# Patient Record
Sex: Female | Born: 1935 | Race: White | Hispanic: No | Smoking: Never smoker
Health system: Southern US, Community
[De-identification: ages and names within clinical notes are randomized; demographics above are authoritative.]

## PROBLEM LIST (undated history)

## (undated) DIAGNOSIS — K224 Dyskinesia of esophagus: Secondary | ICD-10-CM

## (undated) DIAGNOSIS — N189 Chronic kidney disease, unspecified: Secondary | ICD-10-CM

## (undated) DIAGNOSIS — R609 Edema, unspecified: Secondary | ICD-10-CM

## (undated) DIAGNOSIS — R001 Bradycardia, unspecified: Secondary | ICD-10-CM

## (undated) DIAGNOSIS — N183 Chronic kidney disease, stage 3 unspecified: Secondary | ICD-10-CM

## (undated) DIAGNOSIS — D631 Anemia in chronic kidney disease: Secondary | ICD-10-CM

## (undated) DIAGNOSIS — I5041 Acute combined systolic (congestive) and diastolic (congestive) heart failure: Secondary | ICD-10-CM

## (undated) DIAGNOSIS — D649 Anemia, unspecified: Secondary | ICD-10-CM

## (undated) DIAGNOSIS — N179 Acute kidney failure, unspecified: Secondary | ICD-10-CM

## (undated) DIAGNOSIS — E785 Hyperlipidemia, unspecified: Secondary | ICD-10-CM

## (undated) DIAGNOSIS — I10 Essential (primary) hypertension: Secondary | ICD-10-CM

## (undated) DIAGNOSIS — I480 Paroxysmal atrial fibrillation: Secondary | ICD-10-CM

## (undated) DIAGNOSIS — E119 Type 2 diabetes mellitus without complications: Secondary | ICD-10-CM

## (undated) DIAGNOSIS — R339 Retention of urine, unspecified: Secondary | ICD-10-CM

## (undated) DIAGNOSIS — N329 Bladder disorder, unspecified: Secondary | ICD-10-CM

## (undated) DIAGNOSIS — N390 Urinary tract infection, site not specified: Secondary | ICD-10-CM

## (undated) DIAGNOSIS — I517 Cardiomegaly: Secondary | ICD-10-CM

## (undated) DIAGNOSIS — D638 Anemia in other chronic diseases classified elsewhere: Secondary | ICD-10-CM

## (undated) DIAGNOSIS — D62 Acute posthemorrhagic anemia: Secondary | ICD-10-CM

## (undated) DIAGNOSIS — R6251 Failure to thrive (child): Secondary | ICD-10-CM

## (undated) DIAGNOSIS — I2721 Secondary pulmonary arterial hypertension: Secondary | ICD-10-CM

## (undated) DIAGNOSIS — I4892 Unspecified atrial flutter: Secondary | ICD-10-CM

## (undated) HISTORY — PX: TONSILLECTOMY: SUR1361

## (undated) HISTORY — DX: Chronic kidney disease, unspecified: N17.9

## (undated) HISTORY — DX: Acute posthemorrhagic anemia: D62

## (undated) HISTORY — DX: Chronic kidney disease, unspecified: N18.9

## (undated) HISTORY — DX: Secondary pulmonary arterial hypertension: I27.21

## (undated) HISTORY — DX: Dyskinesia of esophagus: K22.4

## (undated) HISTORY — PX: PARTIAL HYSTERECTOMY: SHX80

## (undated) HISTORY — DX: Cardiomegaly: I51.7

## (undated) HISTORY — DX: Urinary tract infection, site not specified: N39.0

## (undated) HISTORY — PX: TOE SURGERY: SHX1073

## (undated) HISTORY — DX: Anemia in other chronic diseases classified elsewhere: D63.8

## (undated) HISTORY — DX: Anemia in chronic kidney disease: D63.1

## (undated) HISTORY — DX: Acute combined systolic (congestive) and diastolic (congestive) heart failure: I50.41

## (undated) HISTORY — DX: Retention of urine, unspecified: R33.9

## (undated) HISTORY — DX: Failure to thrive (child): R62.51

## (undated) HISTORY — DX: Edema, unspecified: R60.9

## (undated) HISTORY — DX: Bladder disorder, unspecified: N32.9

## (undated) MED FILL — Ferumoxytol Inj 510 MG/17ML (30 MG/ML) (Elemental Fe): INTRAVENOUS | Qty: 17 | Status: AC

## (undated) MED FILL — Luspatercept-aamt For Subcutaneous Inj 25 MG: SUBCUTANEOUS | Qty: 1.7 | Status: AC

---

## 2013-03-23 ENCOUNTER — Ambulatory Visit (INDEPENDENT_AMBULATORY_CARE_PROVIDER_SITE_OTHER): Payer: Medicare Other

## 2013-03-23 ENCOUNTER — Encounter (INDEPENDENT_AMBULATORY_CARE_PROVIDER_SITE_OTHER): Payer: Self-pay

## 2013-03-23 VITALS — BP 127/61 | HR 78 | Temp 97.6°F | Resp 16

## 2013-03-23 DIAGNOSIS — L97509 Non-pressure chronic ulcer of other part of unspecified foot with unspecified severity: Secondary | ICD-10-CM

## 2013-03-23 DIAGNOSIS — B351 Tinea unguium: Secondary | ICD-10-CM

## 2013-03-23 DIAGNOSIS — M2040 Other hammer toe(s) (acquired), unspecified foot: Secondary | ICD-10-CM

## 2013-03-23 DIAGNOSIS — E1140 Type 2 diabetes mellitus with diabetic neuropathy, unspecified: Secondary | ICD-10-CM

## 2013-03-23 DIAGNOSIS — E1142 Type 2 diabetes mellitus with diabetic polyneuropathy: Secondary | ICD-10-CM

## 2013-03-23 DIAGNOSIS — L02619 Cutaneous abscess of unspecified foot: Secondary | ICD-10-CM

## 2013-03-23 DIAGNOSIS — M79609 Pain in unspecified limb: Secondary | ICD-10-CM

## 2013-03-23 DIAGNOSIS — E1149 Type 2 diabetes mellitus with other diabetic neurological complication: Secondary | ICD-10-CM

## 2013-03-23 MED ORDER — AMOXICILLIN-POT CLAVULANATE 875-125 MG PO TABS: 1.0000 | ORAL_TABLET | Freq: Two times a day (BID) | ORAL | Status: DC

## 2013-03-23 MED ORDER — CIPROFLOXACIN HCL 500 MG PO TABS: 500.0000 mg | ORAL_TABLET | Freq: Two times a day (BID) | ORAL | Status: DC

## 2013-03-23 NOTE — Progress Notes (Signed)
  Subjective:    Patient ID: Maureen Stout, female    DOB: 05/20/1931, 79 y.o.   MRN: [id number] Trim nails and trim on my 4th toe, has a place on it HPI within the last week to 2 weeks the fourth toe.to be red and swollen there was some increased bleeding. Patient is also been having possibly a bladder infection. The toe is definitely red and swollen with some discharge drainage distal ulceration fourth left    Review of Systems  Constitutional: Negative.   HENT: Negative.   Eyes: Negative.   Respiratory: Negative.   Cardiovascular: Negative.   Gastrointestinal: Negative.   Endocrine: Negative.   Genitourinary: Negative.   Musculoskeletal: Negative.   Skin: Negative.   Allergic/Immunologic: Negative.   Neurological: Negative.   Hematological: Bruises/bleeds easily.  Psychiatric/Behavioral: Negative.        Objective:   Physical Exam Vascular status as follows DP +0/4 PT plus one over 4. Refill timed 3-4 seconds all digits. Skin temperature warm turgor diminished no varicosities noted neurologically epicritic and proprioceptive sensations grossly diminished on Semmes Weinstein testing. Normal plantar response. Patient had distal digital dictation third left has open active ulceration fourth left with hemorrhage a keratoses and maceration. Severe digital contractures 2 through 5 bilateral again amputated third left noted. Slight edema and erythema localized to distal tuft of the fourth digit. No ascending cellulitis noted no lymphangitis. The remaining nails have thickness brittle discolored and friable nails tender on palpation with enclosed shoe wear and ambulation. Patient wearing her diabetic shoes as instructed however continues to have difficulties. Has reduced her activity levels as instructed as well.    Assessment & Plan:  Diabetes with peripheral neuropathy. Ulceration with cellulitis and infection distal tuft fourth left the ulcer site is debrided treatment will maintain  Silvadene and gauze dressing being applied. The ulcer is approximately 0.6 cm in diameter with a 2 mm depth. No malodor is noted. On debridement Silvadene gauze dressing is applied. The remaining nails x8 are debrided and the presence of onychomycosis pain or symptomology. As well as her diabetes. Patient will be recheck in 2 weeks for followup at this time prescription for Augmentin 875 twice a day and Cipro 500 twice a day are both prescribed patient will maintain the a bike regimen as instructed maintain accommodative shoes recheck in 2 weeks for followup  Harriet Masson DPM

## 2013-03-23 NOTE — Patient Instructions (Signed)
Instructions for Wound Care  The most important step to healing a foot wound is to reduce the pressure on your foot - it is extremely important to stay off your foot as much as possible and wear the shoe/boot as instructed.  Cleanse your foot with saline wash or warm soapy water (dial antibacterial soap or similar).  Blot dry.  Apply prescribed medication to your wound and cover with gauze and a bandage.  May hold bandage in place with Coban (self sticky wrap), Ace bandage or tape.  You may find dressing supplies at your local [employer], [employer], drug store or medical supply store.  Your prescribed topical medication is :  Silvadene Cream (twice daily)  [facility] [employer] is a mail order medical supply company that we use to provide some of our would care products.  If we use their service of you, you will receive the product by mail.  If you have not received the medication in 3 business days, please call our office.  If you notice any foul odor, increase in pain, pus, increased swelling, red streaks or generalized redness occurring in your foot or leg-Call our office immediately to be seen.  This may be a sign of a limb or life threatening infection that will need prompt attention.  Harriet Masson, [place]  [facility]

## 2013-04-20 ENCOUNTER — Ambulatory Visit: Payer: Medicare Other

## 2013-04-26 ENCOUNTER — Ambulatory Visit (INDEPENDENT_AMBULATORY_CARE_PROVIDER_SITE_OTHER): Payer: Medicare Other

## 2013-04-26 VITALS — BP 156/75 | HR 87 | Resp 16

## 2013-04-26 DIAGNOSIS — L97509 Non-pressure chronic ulcer of other part of unspecified foot with unspecified severity: Secondary | ICD-10-CM

## 2013-04-26 DIAGNOSIS — M2040 Other hammer toe(s) (acquired), unspecified foot: Secondary | ICD-10-CM

## 2013-04-26 DIAGNOSIS — E1149 Type 2 diabetes mellitus with other diabetic neurological complication: Secondary | ICD-10-CM

## 2013-04-26 DIAGNOSIS — E1140 Type 2 diabetes mellitus with diabetic neuropathy, unspecified: Secondary | ICD-10-CM

## 2013-04-26 DIAGNOSIS — E1142 Type 2 diabetes mellitus with diabetic polyneuropathy: Secondary | ICD-10-CM

## 2013-04-26 NOTE — Patient Instructions (Signed)
Diabetes and Foot Care Diabetes may cause you to have problems because of poor blood supply (circulation) to your feet and legs. This may cause the skin on your feet to become thinner, break easier, and heal more slowly. Your skin may become dry, and the skin may peel and crack. You may also have nerve damage in your legs and feet causing decreased feeling in them. You may not notice minor injuries to your feet that could lead to infections or more serious problems. Taking care of your feet is one of the most important things you can do for yourself.  HOME CARE INSTRUCTIONS  Wear shoes at all times, even in the house. Do not go barefoot. Bare feet are easily injured.  Check your feet daily for blisters, cuts, and redness. If you cannot see the bottom of your feet, use a mirror or ask someone for help.  Wash your feet with warm water (do not use hot water) and mild soap. Then pat your feet and the areas between your toes until they are completely dry. Do not soak your feet as this can dry your skin.  Apply a moisturizing lotion or petroleum jelly (that does not contain alcohol and is unscented) to the skin on your feet and to dry, brittle toenails. Do not apply lotion between your toes.  Trim your toenails straight across. Do not dig under them or around the cuticle. File the edges of your nails with an emery board or nail file.  Do not cut corns or calluses or try to remove them with medicine.  Wear clean socks or stockings every day. Make sure they are not too tight. Do not wear knee-high stockings since they may decrease blood flow to your legs.  Wear shoes that fit properly and have enough cushioning. To break in new shoes, wear them for just a few hours a day. This prevents you from injuring your feet. Always look in your shoes before you put them on to be sure there are no objects inside.  Do not cross your legs. This may decrease the blood flow to your feet.  If you find a minor scrape,  cut, or break in the skin on your feet, keep it and the skin around it clean and dry. These areas may be cleansed with mild soap and water. Do not cleanse the area with peroxide, alcohol, or iodine.  When you remove an adhesive bandage, be sure not to damage the skin around it.  If you have a wound, look at it several times a day to make sure it is healing.  Do not use heating pads or hot water bottles. They may burn your skin. If you have lost feeling in your feet or legs, you may not know it is happening until it is too late.  Make sure your health care provider performs a complete foot exam at least annually or more often if you have foot problems. Report any cuts, sores, or bruises to your health care provider immediately. SEEK MEDICAL CARE IF:   You have an injury that is not healing.  You have cuts or breaks in the skin.  You have an ingrown nail.  You notice redness on your legs or feet.  You feel burning or tingling in your legs or feet.  You have pain or cramps in your legs and feet.  Your legs or feet are numb.  Your feet always feel cold. SEEK IMMEDIATE MEDICAL CARE IF:   There is increasing redness,   swelling, or pain in or around a wound.  There is a red line that goes up your leg.  Pus is coming from a wound.  You develop a fever or as directed by your health care provider.  You notice a bad smell coming from an ulcer or wound. Document Released: 05/01/2000 Document Revised: 01/04/2013 Document Reviewed: 10/11/2012 [employer] Patient Information 2014 [employer].  

## 2013-04-26 NOTE — Progress Notes (Signed)
   Subjective:    Patient ID: Maureen Stout, female    DOB: 20-Jul-1931, 79 y.o.   MRN: [id number]  HPI my 4th toe on my left foot looks good    Review of Systems no new changes or findings     Objective:   Physical Exam Neurovascular status intact and unchanged pedal pulses DP nonpalpable PT plus one over 4 bilateral Refill time 3 seconds all digits. Patient is ulceration to distal tuft distal lateral fourth digit left foot showing some hemorrhage a keratoses there is no active discharge or drainage patient been doing soaking and completed her in a bike regimen as instructed. Patient had amputated distal tuft of third digit and the fourth is under lapping of third toe stump. At this time with hemorrhage a keratosis and nail of the second and fourth digits are debrided back. A keratotic lesion distal tuft of the fourth is debrided down to dermal level only does not go down to subcutaneous tissue the ulcer is healing in resolving. No active discharge or drainage no cellulitis noted. Patient does have continued arthropathy and deformity as well as diabetic neuropathy. As such she attended continues to have the risks for infections and recurrence. We'll monitoring contact us if any changes occur.       Assessment & Plan:  Assessment diabetes with peripheral neuropathy and ulceration distal fourth left. The ulcer is resolving remaining hemorrhage a keratoses debridement at this time down to dermal level maintain to foam padding multiple pads and digital spacers are dispensed at this time for patient to use maintain appropriate diabetic accommodative shoes recheck in 3 months for long-term palliative care visit. Next  Harriet Masson DPM

## 2013-07-27 ENCOUNTER — Ambulatory Visit (INDEPENDENT_AMBULATORY_CARE_PROVIDER_SITE_OTHER): Payer: Commercial Managed Care - HMO

## 2013-07-27 VITALS — BP 161/84 | HR 67 | Resp 16

## 2013-07-27 DIAGNOSIS — E1140 Type 2 diabetes mellitus with diabetic neuropathy, unspecified: Secondary | ICD-10-CM

## 2013-07-27 DIAGNOSIS — E1142 Type 2 diabetes mellitus with diabetic polyneuropathy: Secondary | ICD-10-CM

## 2013-07-27 DIAGNOSIS — M79609 Pain in unspecified limb: Secondary | ICD-10-CM

## 2013-07-27 DIAGNOSIS — B351 Tinea unguium: Secondary | ICD-10-CM

## 2013-07-27 DIAGNOSIS — Q828 Other specified congenital malformations of skin: Secondary | ICD-10-CM

## 2013-07-27 DIAGNOSIS — E1149 Type 2 diabetes mellitus with other diabetic neurological complication: Secondary | ICD-10-CM

## 2013-07-27 NOTE — Progress Notes (Signed)
° °  Subjective:    Patient ID: Maureen Stout, female    DOB: [birth year], 80 y.o.   MRN: [id number]  HPI I am here to get my nails and calluses trimmed up    Review of Systems no new changes or findings patient is status post amputation of third toe left with history of ulceration second toe left.    Objective:   Physical Exam Or fainting to findings as follows DP pulse nonpalpable PT one over 4 bilateral Refill time 3 seconds all digits skin temperature is warm to cool turgor diminished there is keratoses distal clavus second right progression second left no open wounds of ulceration also pinch callus of both bilateral at the IP joint. 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 No active ulcers no discharge or drainage noted this time nails and ranges thick and darkened yellow brittle crumbly and friable consistent with onychomycosis and dystrophy of nails patient does have arthropathy as well as diabetic neuropathy decreased epicritic sensation confirmed on Semmes Weinstein testing to forefoot digits       Assessment & Plan:  Assessment this time diabetes with complications history peripheral neuropathy history of ulceration partial toe amputation. This time thick dystrophic probably mycotic nails debrided x9 return in 3 months for continued palliative care and as-needed basis maintain accommodative shoes as instructed  Harriet Masson DPM

## 2013-07-27 NOTE — Patient Instructions (Signed)
Diabetes and Foot Care Diabetes may cause you to have problems because of poor blood supply (circulation) to your feet and legs. This may cause the skin on your feet to become thinner, break easier, and heal more slowly. Your skin may become dry, and the skin may peel and crack. You may also have nerve damage in your legs and feet causing decreased feeling in them. You may not notice minor injuries to your feet that could lead to infections or more serious problems. Taking care of your feet is one of the most important things you can do for yourself.  HOME CARE INSTRUCTIONS  Wear shoes at all times, even in the house. Do not go barefoot. Bare feet are easily injured.  Check your feet daily for blisters, cuts, and redness. If you cannot see the bottom of your feet, use a mirror or ask someone for help.  Wash your feet with warm water (do not use hot water) and mild soap. Then pat your feet and the areas between your toes until they are completely dry. Do not soak your feet as this can dry your skin.  Apply a moisturizing lotion or petroleum jelly (that does not contain alcohol and is unscented) to the skin on your feet and to dry, brittle toenails. Do not apply lotion between your toes.  Trim your toenails straight across. Do not dig under them or around the cuticle. File the edges of your nails with an emery board or nail file.  Do not cut corns or calluses or try to remove them with medicine.  Wear clean socks or stockings every day. Make sure they are not too tight. Do not wear knee-high stockings since they may decrease blood flow to your legs.  Wear shoes that fit properly and have enough cushioning. To break in new shoes, wear them for just a few hours a day. This prevents you from injuring your feet. Always look in your shoes before you put them on to be sure there are no objects inside.  Do not cross your legs. This may decrease the blood flow to your feet.  If you find a minor scrape,  cut, or break in the skin on your feet, keep it and the skin around it clean and dry. These areas may be cleansed with mild soap and water. Do not cleanse the area with peroxide, alcohol, or iodine.  When you remove an adhesive bandage, be sure not to damage the skin around it.  If you have a wound, look at it several times a day to make sure it is healing.  Do not use heating pads or hot water bottles. They may burn your skin. If you have lost feeling in your feet or legs, you may not know it is happening until it is too late.  Make sure your health care provider performs a complete foot exam at least annually or more often if you have foot problems. Report any cuts, sores, or bruises to your health care provider immediately. SEEK MEDICAL CARE IF:   You have an injury that is not healing.  You have cuts or breaks in the skin.  You have an ingrown nail.  You notice redness on your legs or feet.  You feel burning or tingling in your legs or feet.  You have pain or cramps in your legs and feet.  Your legs or feet are numb.  Your feet always feel cold. SEEK IMMEDIATE MEDICAL CARE IF:   There is increasing redness,   swelling, or pain in or around a wound.  There is a red line that goes up your leg.  Pus is coming from a wound.  You develop a fever or as directed by your health care provider.  You notice a bad smell coming from an ulcer or wound. Document Released: 05/01/2000 Document Revised: 01/04/2013 Document Reviewed: 10/11/2012 [employer] Patient Information 2014 [employer].  

## 2013-11-09 ENCOUNTER — Ambulatory Visit: Payer: Commercial Managed Care - HMO

## 2013-11-16 ENCOUNTER — Ambulatory Visit: Payer: Commercial Managed Care - HMO

## 2013-11-27 ENCOUNTER — Ambulatory Visit (INDEPENDENT_AMBULATORY_CARE_PROVIDER_SITE_OTHER): Payer: Commercial Managed Care - HMO

## 2013-11-27 VITALS — BP 134/58 | HR 71 | Resp 18

## 2013-11-27 DIAGNOSIS — E1140 Type 2 diabetes mellitus with diabetic neuropathy, unspecified: Secondary | ICD-10-CM

## 2013-11-27 DIAGNOSIS — M79606 Pain in leg, unspecified: Secondary | ICD-10-CM

## 2013-11-27 DIAGNOSIS — Q828 Other specified congenital malformations of skin: Secondary | ICD-10-CM

## 2013-11-27 DIAGNOSIS — E1149 Type 2 diabetes mellitus with other diabetic neurological complication: Secondary | ICD-10-CM

## 2013-11-27 DIAGNOSIS — B351 Tinea unguium: Secondary | ICD-10-CM

## 2013-11-27 DIAGNOSIS — M79609 Pain in unspecified limb: Secondary | ICD-10-CM

## 2013-11-27 NOTE — Progress Notes (Signed)
   Subjective:    Patient ID: Maureen Stout, female    DOB: April 16, 1932, 80 y.o.   MRN: [id number]  HPI I AM TO GET MY TOENAILS TRIMMED UP    Review of Systems no new systemic findings or changes noted     Objective:   Physical Exam Lower extremity objective findings as follows vascular status is intact DP plus one over 4 and thready PT plus one over 4 bilateral capillary refill timed 3-4 seconds all digits skin temperature warm turgor diminished there is keratoses distal clavus fourth digit right second digit left no open wound or ulcer there is hemorrhage a keratosis at the first MTP joint left with history of the blood blister weeks when she went to a wedding and worn dress shoes ,nails thick criptotic incurvated friable 1 through 5 bilateral also patient had amputated third toe left foot patient continues to have thickness brittleness discoloration nails the presence of diabetes and complications mycotic      Assessment & Plan:   nails debrided x9 patient also at this time had multiple keratoses distal fourth right distal second left and pinch callus first MTP her left recheck in 3 months for followup continued palliative care maintain diabetic accident shoes at all times patient diabetes with history peripheral neuropathy as well as mycotic brittle crumbly friable nails also keratoses is noted are debrided  Harriet Masson DPM

## 2014-02-26 ENCOUNTER — Ambulatory Visit (INDEPENDENT_AMBULATORY_CARE_PROVIDER_SITE_OTHER): Payer: Commercial Managed Care - HMO

## 2014-02-26 VITALS — BP 121/60 | HR 87 | Resp 12

## 2014-02-26 DIAGNOSIS — S90122A Contusion of left lesser toe(s) without damage to nail, initial encounter: Secondary | ICD-10-CM

## 2014-02-26 DIAGNOSIS — S90212A Contusion of left great toe with damage to nail, initial encounter: Secondary | ICD-10-CM

## 2014-02-26 DIAGNOSIS — M79606 Pain in leg, unspecified: Secondary | ICD-10-CM

## 2014-02-26 DIAGNOSIS — E1140 Type 2 diabetes mellitus with diabetic neuropathy, unspecified: Secondary | ICD-10-CM

## 2014-02-26 DIAGNOSIS — B351 Tinea unguium: Secondary | ICD-10-CM

## 2014-02-26 DIAGNOSIS — L97521 Non-pressure chronic ulcer of other part of left foot limited to breakdown of skin: Secondary | ICD-10-CM

## 2014-02-26 MED ORDER — CEPHALEXIN 500 MG PO CAPS: 500.0000 mg | ORAL_CAPSULE | Freq: Three times a day (TID) | ORAL | Status: DC

## 2014-02-26 NOTE — Progress Notes (Signed)
Subjective:    Patient ID: Maureen Stout, female    DOB: [birth year], 80 y.o.   MRN: [id number]  HPI  TOENAILS TRIM AND RT FOOT CALLUS IS GETTING THICKER. Patient aware of any new issues however on presentation has a hemorrhage a keratotic draining ulcer site sub-first MTP area left foot there is some slight malodor noted no ascending cellulitis or lymphangitis is noted. Patient also some darkening of the left hallux nail plate with fluctuance consistent with possible nail trauma and subungual hematoma again patient was unaware of.  Review of Systems no new findings or systemic changes noted. Patient is a history of diabetes with history peripheral neuropathy and complications has not been wearing her diabetic shoes at all times t    Objective:   Physical Exam 80 year old white female well-developed well-nourished oriented x3 at this time however has absent sensation in the wear was going on with her foot great toe joint for several weeks to months however has not followed up on that was unaware that it was a problem has been soaking once a week in soap and water or Epsom salts. Largely objective findings as follows pedal pulses palpable DP plus one over 4 PT plus one over 4 thready at best capillary refill time 4 seconds all digits epicritic sensation diminished on Semmes Weinstein to forefoot digits and arch area. There is normal plantar response DTRs not listed neurologically skin color pigment normal hair growth absent nails thick criptotic friable incurvated and brittle one through 5 bilateral there is some darkening discoloration and fluctuance of left hallux nail plate which on exam reveals subungual hematoma with a bloody and serous discharge drainage and debridement of nail patient again was unaware of this she is to the nail head turned dark the last couple of days. Does not remember bumping or hitting it against anything. Has a history of partially amputated third toe left foot remaining nails  thick brittle criptotic incurvated and discolored keratotic lesion with hemorrhage a keratoses and ulceration sub-first MTP area left noted with malodor fissuring the skin and some mild serous bloody drainage noted. Orthopedic exam reveals notable digital contractures HAV deformity with prominence of the first MTP area lateral deviation of the hallux bilateral left more so than right.       Assessment & Plan:  Assessment this time diet-controlled diabetes with history peripheral neuropathy and angiopathy patient has not been compliant does not wear her diabetic shoes at all times while trauma socks or barefoot which is likely contributing to the ulceration and again patient profound neuropathy which is seems to be on aware of her does understand complications. At this time patient's ulcerative lesion sub-first left is debrided down to subcutaneous tissue level Silvadene and gauze dressing are applied and will give instructions for daily soaking soap and water or Epsom salts and water and Silvadene gauze dressings daily also following debridement of left hallux nail which had a hematoma which was evacuated on Silvadene and Band-Aid dressing applied to the hallux will also been soaking in that foot recheck in 2 weeks for followup for the ulcer and hematoma the hallux left foot  Patient's original presentation was for diabetic foot and nail care thick brittle dystrophic friable mycotic nails 1 through 5 right as well as hallux second fourth and fifth left or debridement this time during debridement of the left hallux the hematoma was identified at evacuated. The thick brittle criptotic nails are all debrided follow for future palliative care in 3 months  as recommended for her two-week followup for antibiotic check patient placed on cephalexin 500 mg daily x10 days soaking instructions are given maintain surgical shoe or Darco shoe at it or diabetic shoe at all times no barefoot or flimsy shoes or flip-flops  again reappointed 2 weeks for followup  Harriet Masson DPM

## 2014-02-26 NOTE — Patient Instructions (Signed)
ANTIBACTERIAL SOAP INSTRUCTIONS  THE DAY AFTER PROCEDURE  Please follow the instructions your doctor has marked.   Shower as usual. Before getting out, place a drop of antibacterial liquid soap (Dial) on a wet, clean washcloth.  Gently wipe washcloth over affected area.  Afterward, rinse the area with warm water.  Blot the area dry with a soft cloth and cover with antibiotic ointment (neosporin, polysporin, bacitracin) and band aid or gauze and tape  Place 3-4 drops of antibacterial liquid soap in a quart of warm tap water.  Submerge foot into water for 20 minutes.  If bandage was applied after your procedure, leave on to allow for easy lift off, then remove and continue with soak for the remaining time.  Next, blot area dry with a soft cloth and cover with a bandage.  Apply other medications as directed by your doctor, such as cortisporin otic solution (eardrops) or neosporin antibiotic ointment  Soak daily for 10 minutes and soap water or as alternative may use Epsom salts in warm water. Following the soap dried thoroughly and apply Silvadene and gauze dressing to left foot and great toe. Maintain soaking and dressing changes until wound has resolved

## 2014-02-28 ENCOUNTER — Ambulatory Visit: Payer: Commercial Managed Care - HMO

## 2014-03-12 ENCOUNTER — Ambulatory Visit (INDEPENDENT_AMBULATORY_CARE_PROVIDER_SITE_OTHER): Payer: Commercial Managed Care - HMO

## 2014-03-12 VITALS — BP 149/78 | HR 79 | Resp 12

## 2014-03-12 DIAGNOSIS — L97521 Non-pressure chronic ulcer of other part of left foot limited to breakdown of skin: Secondary | ICD-10-CM

## 2014-03-12 DIAGNOSIS — S90122D Contusion of left lesser toe(s) without damage to nail, subsequent encounter: Secondary | ICD-10-CM

## 2014-03-12 DIAGNOSIS — E1140 Type 2 diabetes mellitus with diabetic neuropathy, unspecified: Secondary | ICD-10-CM

## 2014-03-12 NOTE — Progress Notes (Signed)
   Subjective:    Patient ID: Maureen Stout, female    DOB: Jul 20, 1931, 80 y.o.   MRN: [id number]  HPI  ''LT FOOT STII HAVING LITTLE DRAINGE, BUT LOOKS A LITTLE BETTER.''  Review of Systems no new systemic changes or findings noted     Objective:   Physical Exam  the hallux nailbed appears to be healing well having had contusion hematoma is resolving however does have hemorrhage keratoses including dystrophic discoloration drainage keratoses sub-first MTP area left foot. On debridement there is some pinpoint petechiae and approximate 2-3 mm ulceration under the hallux down to the dermal subdermal junction echo down to bone or capsule mild bloody drainage noted mild serous drainage noted no purulence no odor patient is down to her last antibiotic medication advised to complete her antibiotics and then can discontinue however will maintain daily cleansing with antibacterial soap and Silvadene gauze dressing daily. Maintain diabetic shoes or accommodative extra-depth shoes at all times number foot no flimsy shoes or flip-flops       Assessment & Plan:   assessment resolving contusion of the hallux and ulcer of the first MTP joint left foot appears to be improving resolved although not completely healed continue with Silvadene and gauze dressings complete anabolic regimen no signs of current infection no ascending size lymphangitis noted recheck in 4 weeks for long-term follow-up likely 2-3 months for future palliative care is needed next  Harriet Masson DPM

## 2014-03-12 NOTE — Patient Instructions (Signed)
ANTIBACTERIAL SOAP INSTRUCTIONS  THE DAY AFTER PROCEDURE  Please follow the instructions your doctor has marked.   Shower as usual. Before getting out, place a drop of antibacterial liquid soap (Dial) on a wet, clean washcloth.  Gently wipe washcloth over affected area.  Afterward, rinse the area with warm water.  Blot the area dry with a soft cloth and cover with antibiotic ointment (neosporin, polysporin, bacitracin) and band aid or gauze and tape  Place 3-4 drops of antibacterial liquid soap in a quart of warm tap water.  Submerge foot into water for 20 minutes.  If bandage was applied after your procedure, leave on to allow for easy lift off, then remove and continue with soak for the remaining time.  Next, blot area dry with a soft cloth and cover with a bandage.  Apply other medications as directed by your doctor, such as cortisporin otic solution (eardrops) or neosporin antibiotic ointment   washed daily apply Silvadene and gauze dressing daily until ulcer has resolved

## 2014-04-09 ENCOUNTER — Ambulatory Visit (INDEPENDENT_AMBULATORY_CARE_PROVIDER_SITE_OTHER): Payer: Commercial Managed Care - HMO

## 2014-04-09 VITALS — BP 162/72 | HR 72 | Resp 12

## 2014-04-09 DIAGNOSIS — E1140 Type 2 diabetes mellitus with diabetic neuropathy, unspecified: Secondary | ICD-10-CM

## 2014-04-09 DIAGNOSIS — S90122A Contusion of left lesser toe(s) without damage to nail, initial encounter: Secondary | ICD-10-CM

## 2014-04-09 DIAGNOSIS — L97521 Non-pressure chronic ulcer of other part of left foot limited to breakdown of skin: Secondary | ICD-10-CM

## 2014-04-09 DIAGNOSIS — S90122D Contusion of left lesser toe(s) without damage to nail, subsequent encounter: Secondary | ICD-10-CM

## 2014-04-09 NOTE — Progress Notes (Signed)
   Subjective:    Patient ID: Maureen Stout, female    DOB: 01/14/1932, 80 y.o.   MRN: [id number]  HPI  ''LT FOOT IS DOING OK AND [facility] BETTER.''  Review of Systems no new findings or systemic changes noted     Objective:   Physical Exam Neurovascular status intact and unchanged DP +2 PT plus one over 4 Refill time 3 seconds patient had a contusion to left hallux with subungual hematoma at this time patient cases the other day she found her nail offer toe in her bedding of the nail had auto avulsed of the left hallux nail bed has clean nailbed slight eschar tissue is debrided distal nail fold however no open wounds no ulcers on the nailbed of the left great toe there is hemorrhage a keratoses and resolving ulceration down to dermal level sub-first MTP area left with soak several petechiae noted and begin hemorrhage a keratosis with resolving ulcer no active bleeding no discharge or drainage the ulcer site is debrided down to dermal level 0 out of hemorrhage a keratotic tissue is removed away patient will continue with Silvadene or recommended any lotion twice daily to the affected area of both feet help with dry cracked skin presence of her diabetes.       Assessment & Plan:  Assessment contusion of left hallux with lysis and auto avulsion of nail plate continue monitor that area for regrowth future palliative care with beneficial suggest a 2-3 month follow-up for palliative care is scheduled. At this time the ulcer sub-first left is debrided down to dermal level Silvadene and Band-Aid dressing applied however we'll continue with cream or lotion daily application maintain probing diabetic shoes at all times the ulcers resolved no active bleeding no discharge or drainage no signs of infection assessment resolving ulceration down to dermal level only at this time less than 3-4 mm in overall diameter with surrounding petechiae and subungual petechiae noted. Follow-up in 2 months  Maureen Stout  DPM

## 2014-04-09 NOTE — Patient Instructions (Signed)
Diabetes and Foot Care Diabetes may cause you to have problems because of poor blood supply (circulation) to your feet and legs. This may cause the skin on your feet to become thinner, break easier, and heal more slowly. Your skin may become dry, and the skin may peel and crack. You may also have nerve damage in your legs and feet causing decreased feeling in them. You may not notice minor injuries to your feet that could lead to infections or more serious problems. Taking care of your feet is one of the most important things you can do for yourself.  HOME CARE INSTRUCTIONS  Wear shoes at all times, even in the house. Do not go barefoot. Bare feet are easily injured.  Check your feet daily for blisters, cuts, and redness. If you cannot see the bottom of your feet, use a mirror or ask someone for help.  Wash your feet with warm water (do not use hot water) and mild soap. Then pat your feet and the areas between your toes until they are completely dry. Do not soak your feet as this can dry your skin.  Apply a moisturizing lotion or petroleum jelly (that does not contain alcohol and is unscented) to the skin on your feet and to dry, brittle toenails. Do not apply lotion between your toes.  Trim your toenails straight across. Do not dig under them or around the cuticle. File the edges of your nails with an emery board or nail file.  Do not cut corns or calluses or try to remove them with medicine.  Wear clean socks or stockings every day. Make sure they are not too tight. Do not wear knee-high stockings since they may decrease blood flow to your legs.  Wear shoes that fit properly and have enough cushioning. To break in new shoes, wear them for just a few hours a day. This prevents you from injuring your feet. Always look in your shoes before you put them on to be sure there are no objects inside.  Do not cross your legs. This may decrease the blood flow to your feet.  If you find a minor scrape,  cut, or break in the skin on your feet, keep it and the skin around it clean and dry. These areas may be cleansed with mild soap and water. Do not cleanse the area with peroxide, alcohol, or iodine.  When you remove an adhesive bandage, be sure not to damage the skin around it.  If you have a wound, look at it several times a day to make sure it is healing.  Do not use heating pads or hot water bottles. They may burn your skin. If you have lost feeling in your feet or legs, you may not know it is happening until it is too late.  Make sure your health care provider performs a complete foot exam at least annually or more often if you have foot problems. Report any cuts, sores, or bruises to your health care provider immediately. SEEK MEDICAL CARE IF:   You have an injury that is not healing.  You have cuts or breaks in the skin.  You have an ingrown nail.  You notice redness on your legs or feet.  You feel burning or tingling in your legs or feet.  You have pain or cramps in your legs and feet.  Your legs or feet are numb.  Your feet always feel cold. SEEK IMMEDIATE MEDICAL CARE IF:   There is increasing redness,   swelling, or pain in or around a wound.  There is a red line that goes up your leg.  Pus is coming from a wound.  You develop a fever or as directed by your health care provider.  You notice a bad smell coming from an ulcer or wound. Document Released: 05/01/2000 Document Revised: 01/04/2013 Document Reviewed: 10/11/2012 Exit[employer] Patient Information 2015 [employer]. This information is not intended to replace advice given to you by your health care provider. Make sure you discuss any questions you have with your health care provider.  

## 2014-05-21 ENCOUNTER — Ambulatory Visit (INDEPENDENT_AMBULATORY_CARE_PROVIDER_SITE_OTHER): Payer: Commercial Managed Care - HMO

## 2014-05-21 DIAGNOSIS — Q828 Other specified congenital malformations of skin: Secondary | ICD-10-CM

## 2014-05-21 DIAGNOSIS — M79606 Pain in leg, unspecified: Secondary | ICD-10-CM

## 2014-05-21 DIAGNOSIS — B351 Tinea unguium: Secondary | ICD-10-CM | POA: Diagnosis not present

## 2014-05-21 DIAGNOSIS — E1140 Type 2 diabetes mellitus with diabetic neuropathy, unspecified: Secondary | ICD-10-CM | POA: Diagnosis not present

## 2014-05-21 NOTE — Patient Instructions (Signed)
Diabetes and Foot Care Diabetes may cause you to have problems because of poor blood supply (circulation) to your feet and legs. This may cause the skin on your feet to become thinner, break easier, and heal more slowly. Your skin may become dry, and the skin may peel and crack. You may also have nerve damage in your legs and feet causing decreased feeling in them. You may not notice minor injuries to your feet that could lead to infections or more serious problems. Taking care of your feet is one of the most important things you can do for yourself.  HOME CARE INSTRUCTIONS  Wear shoes at all times, even in the house. Do not go barefoot. Bare feet are easily injured.  Check your feet daily for blisters, cuts, and redness. If you cannot see the bottom of your feet, use a mirror or ask someone for help.  Wash your feet with warm water (do not use hot water) and mild soap. Then pat your feet and the areas between your toes until they are completely dry. Do not soak your feet as this can dry your skin.  Apply a moisturizing lotion or petroleum jelly (that does not contain alcohol and is unscented) to the skin on your feet and to dry, brittle toenails. Do not apply lotion between your toes.  Trim your toenails straight across. Do not dig under them or around the cuticle. File the edges of your nails with an emery board or nail file.  Do not cut corns or calluses or try to remove them with medicine.  Wear clean socks or stockings every day. Make sure they are not too tight. Do not wear knee-high stockings since they may decrease blood flow to your legs.  Wear shoes that fit properly and have enough cushioning. To break in new shoes, wear them for just a few hours a day. This prevents you from injuring your feet. Always look in your shoes before you put them on to be sure there are no objects inside.  Do not cross your legs. This may decrease the blood flow to your feet.  If you find a minor scrape,  cut, or break in the skin on your feet, keep it and the skin around it clean and dry. These areas may be cleansed with mild soap and water. Do not cleanse the area with peroxide, alcohol, or iodine.  When you remove an adhesive bandage, be sure not to damage the skin around it.  If you have a wound, look at it several times a day to make sure it is healing.  Do not use heating pads or hot water bottles. They may burn your skin. If you have lost feeling in your feet or legs, you may not know it is happening until it is too late.  Make sure your health care provider performs a complete foot exam at least annually or more often if you have foot problems. Report any cuts, sores, or bruises to your health care provider immediately. SEEK MEDICAL CARE IF:   You have an injury that is not healing.  You have cuts or breaks in the skin.  You have an ingrown nail.  You notice redness on your legs or feet.  You feel burning or tingling in your legs or feet.  You have pain or cramps in your legs and feet.  Your legs or feet are numb.  Your feet always feel cold. SEEK IMMEDIATE MEDICAL CARE IF:   There is increasing redness,   swelling, or pain in or around a wound.  There is a red line that goes up your leg.  Pus is coming from a wound.  You develop a fever or as directed by your health care provider.  You notice a bad smell coming from an ulcer or wound. Document Released: 05/01/2000 Document Revised: 01/04/2013 Document Reviewed: 10/11/2012 Exit[employer] Patient Information 2015 [employer]. This information is not intended to replace advice given to you by your health care provider. Make sure you discuss any questions you have with your health care provider.  

## 2014-05-21 NOTE — Progress Notes (Signed)
   Subjective:    Patient ID: Maureen Stout, female    DOB: 05-28-1931, 81 y.o.   MRN: [id number]  HPI  Toenails and callus trim.  Review of Systems   no new findings or systemic changes noted   Objective:   Physical Exam Neurovascular status unchanged DP +2 PT plus one over 4 bilateral capillary refill 3-4 seconds there is hemorrhage a keratoses of hallux IP joint bilateral at the MTP area no open wounds no ulcers no secondary infections distal clavus of the fourth left is also noted with some hemorrhagic nailbeds of the fourth digits bilateral. Do this is doing adductovarus rotation of digits. No active infection is no discharge or drainage nails thick brittle Crumley friable dystrophic discoloration tenderness does have history of diabetes with neuropathy decreased sensation Semmes Weinstein to the forefoot and digits. There is normal plantar response. DTRs not elicited.       Assessment & Plan:  Assessment history of diabetes with complications and history peripheral neuropathy. Debridement of painful mycotic dystrophic nails 1 through 5 bilateral debrided painful mycotic nails 10 also distal and debridement multiple keratoses pinch callus first MTP area bilateral hemorrhage a keratoses noted first left is treated with limited cane and knees and lidocaine in Silvadene and gauze dressing. Will maintain lumicain and Band-Aid dressing or Neosporin and Band-Aid dressing on for a day or 2 as instructed recheck in 2-3 months for follow-up and continued palliative care in the future. Maintain accommodative shoes as instructed    Harriet Masson DPM

## 2014-06-05 DIAGNOSIS — N6002 Solitary cyst of left breast: Secondary | ICD-10-CM | POA: Diagnosis not present

## 2014-06-05 DIAGNOSIS — E1140 Type 2 diabetes mellitus with diabetic neuropathy, unspecified: Secondary | ICD-10-CM | POA: Diagnosis not present

## 2014-06-05 DIAGNOSIS — I10 Essential (primary) hypertension: Secondary | ICD-10-CM | POA: Diagnosis not present

## 2014-06-05 DIAGNOSIS — N183 Chronic kidney disease, stage 3 (moderate): Secondary | ICD-10-CM | POA: Diagnosis not present

## 2014-06-05 DIAGNOSIS — Z6831 Body mass index (BMI) 31.0-31.9, adult: Secondary | ICD-10-CM | POA: Diagnosis not present

## 2014-06-05 DIAGNOSIS — E785 Hyperlipidemia, unspecified: Secondary | ICD-10-CM | POA: Diagnosis not present

## 2014-06-05 DIAGNOSIS — E1122 Type 2 diabetes mellitus with diabetic chronic kidney disease: Secondary | ICD-10-CM | POA: Diagnosis not present

## 2014-06-11 DIAGNOSIS — R928 Other abnormal and inconclusive findings on diagnostic imaging of breast: Secondary | ICD-10-CM | POA: Diagnosis not present

## 2014-08-20 ENCOUNTER — Ambulatory Visit: Payer: Commercial Managed Care - HMO

## 2014-09-03 ENCOUNTER — Ambulatory Visit: Payer: Commercial Managed Care - HMO

## 2014-09-04 DIAGNOSIS — E785 Hyperlipidemia, unspecified: Secondary | ICD-10-CM | POA: Diagnosis not present

## 2014-09-04 DIAGNOSIS — N183 Chronic kidney disease, stage 3 (moderate): Secondary | ICD-10-CM | POA: Diagnosis not present

## 2014-09-04 DIAGNOSIS — R600 Localized edema: Secondary | ICD-10-CM | POA: Diagnosis not present

## 2014-09-04 DIAGNOSIS — E1142 Type 2 diabetes mellitus with diabetic polyneuropathy: Secondary | ICD-10-CM | POA: Diagnosis not present

## 2014-09-04 DIAGNOSIS — Z6832 Body mass index (BMI) 32.0-32.9, adult: Secondary | ICD-10-CM | POA: Diagnosis not present

## 2014-09-04 DIAGNOSIS — I10 Essential (primary) hypertension: Secondary | ICD-10-CM | POA: Diagnosis not present

## 2014-09-06 ENCOUNTER — Ambulatory Visit (INDEPENDENT_AMBULATORY_CARE_PROVIDER_SITE_OTHER): Payer: Commercial Managed Care - HMO | Admitting: Podiatrist

## 2014-09-06 ENCOUNTER — Encounter: Payer: Self-pay | Admitting: Podiatrist

## 2014-09-06 DIAGNOSIS — Q828 Other specified congenital malformations of skin: Secondary | ICD-10-CM

## 2014-09-06 DIAGNOSIS — E1140 Type 2 diabetes mellitus with diabetic neuropathy, unspecified: Secondary | ICD-10-CM

## 2014-09-06 DIAGNOSIS — M79606 Pain in leg, unspecified: Secondary | ICD-10-CM | POA: Diagnosis not present

## 2014-09-06 DIAGNOSIS — B351 Tinea unguium: Secondary | ICD-10-CM

## 2014-09-06 DIAGNOSIS — M2040 Other hammer toe(s) (acquired), unspecified foot: Secondary | ICD-10-CM

## 2014-09-06 NOTE — Progress Notes (Signed)
HPI: Patient presents today for follow up of diabetic foot and nail care. Past medical history, meds, and allergies reviewed.   Objective:   Objective:  Patients chart is reviewed.  Vascular status reveals pedal pulses noted at  2 out of 4 dp and pt bilateral .  Neurological sensation is Decreased to [employer] monofilament bilateral at 2/5 sites bilateral.  Dermatological exam reveals  presence of pre ulcerative/ hyperkeratotic lesions medial hallux bilateral.    Toenails are elongated, incurvated, discolored, dystrophic with ingrown deformity present.    Assessment: Diabetes with Neuropathy , Ingrown nail deformity, hyperkeratotic lesion x 2  Plan: Discussed treatment options and alternatives. Debrided nails without complication. Debrided hyperkeratotic lesions without complication.  Return appointment recommended at routine intervals of 3 months.

## 2014-10-01 DIAGNOSIS — M7989 Other specified soft tissue disorders: Secondary | ICD-10-CM | POA: Diagnosis not present

## 2014-10-01 DIAGNOSIS — Z6832 Body mass index (BMI) 32.0-32.9, adult: Secondary | ICD-10-CM | POA: Diagnosis not present

## 2014-10-01 DIAGNOSIS — E119 Type 2 diabetes mellitus without complications: Secondary | ICD-10-CM | POA: Diagnosis not present

## 2014-10-01 DIAGNOSIS — M25475 Effusion, left foot: Secondary | ICD-10-CM | POA: Diagnosis not present

## 2014-10-01 DIAGNOSIS — M109 Gout, unspecified: Secondary | ICD-10-CM | POA: Diagnosis not present

## 2014-10-30 DIAGNOSIS — Z1389 Encounter for screening for other disorder: Secondary | ICD-10-CM | POA: Diagnosis not present

## 2014-10-30 DIAGNOSIS — Z9181 History of falling: Secondary | ICD-10-CM | POA: Diagnosis not present

## 2014-10-30 DIAGNOSIS — Z6831 Body mass index (BMI) 31.0-31.9, adult: Secondary | ICD-10-CM | POA: Diagnosis not present

## 2014-10-30 DIAGNOSIS — M109 Gout, unspecified: Secondary | ICD-10-CM | POA: Diagnosis not present

## 2014-11-28 ENCOUNTER — Encounter: Payer: Self-pay | Admitting: Podiatry

## 2014-11-28 ENCOUNTER — Ambulatory Visit (INDEPENDENT_AMBULATORY_CARE_PROVIDER_SITE_OTHER): Payer: Commercial Managed Care - HMO | Admitting: Podiatry

## 2014-11-28 VITALS — BP 104/44 | HR 85 | Resp 18

## 2014-11-28 DIAGNOSIS — B351 Tinea unguium: Secondary | ICD-10-CM

## 2014-11-28 DIAGNOSIS — M79606 Pain in leg, unspecified: Secondary | ICD-10-CM

## 2014-11-28 NOTE — Progress Notes (Signed)
Patient ID: Maureen Stout, female   DOB: 1932-03-19, 81 y.o.   MRN: [id number] Complaint:  Visit Type: Patient returns to my office for continued preventative foot care services. Complaint: Patient states" my nails have grown long and thick and become painful to walk and wear shoes.. She presents for preventative foot care services. No changes to ROS.  She has just been treated for gout by her medical doctor.  Podiatric Exam: Vascular: dorsalis pedis and posterior tibial pulses are palpable bilateral. Capillary return is immediate. Temperature gradient is WNL. Skin turgor WNL  Sensorium: Normal Semmes Weinstein monofilament test. Normal tactile sensation bilaterally. Nail Exam: Pt has thick disfigured discolored nails with subungual debris noted bilateral entire nail hallux through fifth toenails Ulcer Exam: There is no evidence of ulcer or pre-ulcerative changes or infection. Orthopedic Exam: Muscle tone and strength are WNL. No limitations in general ROM. No crepitus or effusions noted. Foot type and digits show no abnormalities. Bony prominences are unremarkable. Persistant redness big toe left foot with desquamation. Contracted toes 2-4 B/L. Skin: No Porokeratosis. No infection or ulcers  Diagnosis:  Tinea unguium, Pain in right toe, pain in left toes  Treatment & Plan Procedures and Treatment: Consent by patient was obtained for treatment procedures. The patient understood the discussion of treatment and procedures well. All questions were answered thoroughly reviewed. Debridement of mycotic and hypertrophic toenails, 1 through 5 bilateral and clearing of subungual debris. No ulceration, no infection noted.  Return Visit-Office Procedure: Patient instructed to return to the office for a follow up visit 3 months for continued evaluation and treatment.

## 2014-12-07 DIAGNOSIS — N183 Chronic kidney disease, stage 3 (moderate): Secondary | ICD-10-CM | POA: Diagnosis not present

## 2014-12-07 DIAGNOSIS — E785 Hyperlipidemia, unspecified: Secondary | ICD-10-CM | POA: Diagnosis not present

## 2014-12-07 DIAGNOSIS — Z6831 Body mass index (BMI) 31.0-31.9, adult: Secondary | ICD-10-CM | POA: Diagnosis not present

## 2014-12-07 DIAGNOSIS — Z1231 Encounter for screening mammogram for malignant neoplasm of breast: Secondary | ICD-10-CM | POA: Diagnosis not present

## 2014-12-07 DIAGNOSIS — M109 Gout, unspecified: Secondary | ICD-10-CM | POA: Diagnosis not present

## 2014-12-07 DIAGNOSIS — E1142 Type 2 diabetes mellitus with diabetic polyneuropathy: Secondary | ICD-10-CM | POA: Diagnosis not present

## 2014-12-07 DIAGNOSIS — I10 Essential (primary) hypertension: Secondary | ICD-10-CM | POA: Diagnosis not present

## 2014-12-07 DIAGNOSIS — E1122 Type 2 diabetes mellitus with diabetic chronic kidney disease: Secondary | ICD-10-CM | POA: Diagnosis not present

## 2014-12-10 DIAGNOSIS — N189 Chronic kidney disease, unspecified: Secondary | ICD-10-CM | POA: Diagnosis not present

## 2014-12-10 DIAGNOSIS — D519 Vitamin B12 deficiency anemia, unspecified: Secondary | ICD-10-CM | POA: Diagnosis not present

## 2014-12-25 DIAGNOSIS — Z1231 Encounter for screening mammogram for malignant neoplasm of breast: Secondary | ICD-10-CM | POA: Diagnosis not present

## 2015-03-04 ENCOUNTER — Ambulatory Visit (INDEPENDENT_AMBULATORY_CARE_PROVIDER_SITE_OTHER): Payer: Commercial Managed Care - HMO | Admitting: Podiatry

## 2015-03-04 ENCOUNTER — Encounter: Payer: Self-pay | Admitting: Podiatry

## 2015-03-04 DIAGNOSIS — M79606 Pain in leg, unspecified: Secondary | ICD-10-CM | POA: Diagnosis not present

## 2015-03-04 DIAGNOSIS — B351 Tinea unguium: Secondary | ICD-10-CM | POA: Diagnosis not present

## 2015-03-04 NOTE — Progress Notes (Signed)
Patient ID: Maureen Stout, female   DOB: [birth year], 81 y.o.   MRN: [id number] Complaint:  Visit Type: Patient returns to my office for continued preventative foot care services. Complaint: Patient states" my nails have grown long and thick and become painful to walk and wear shoes.. She presents for preventative foot care services. No changes to ROS.  She has just been treated for gout by her medical doctor.  Podiatric Exam: Vascular: dorsalis pedis and posterior tibial pulses are palpable bilateral. Capillary return is immediate. Temperature gradient is WNL. Skin turgor WNL  Sensorium: Normal Semmes Weinstein monofilament test. Normal tactile sensation bilaterally. Nail Exam: Pt has thick disfigured discolored nails with subungual debris noted bilateral entire nail hallux through fifth toenails Ulcer Exam: There is no evidence of ulcer or pre-ulcerative changes or infection. Orthopedic Exam: Muscle tone and strength are WNL. No limitations in general ROM. No crepitus or effusions noted. Foot type and digits show no abnormalities. Bony prominences are unremarkable. Persistant redness big toe left foot with desquamation. Contracted toes 2-4 B/L. Skin: No Porokeratosis. No infection or ulcers.  Distal clavi noted fourth toe right foot.  Diagnosis:  Tinea unguium, Pain in right toe, pain in left toes,  Distal clavi 4th toe right foot.  Treatment & Plan Procedures and Treatment: Consent by patient was obtained for treatment procedures. The patient understood the discussion of treatment and procedures well. All questions were answered thoroughly reviewed. Debridement of mycotic and hypertrophic toenails, 1 through 5 bilateral and clearing of subungual debris. No ulceration, no infection noted. Debride distal clavi  Return Visit-Office Procedure: Patient instructed to return to the office for a follow up visit 3 months for continued evaluation and treatment.

## 2015-03-12 DIAGNOSIS — Z23 Encounter for immunization: Secondary | ICD-10-CM | POA: Diagnosis not present

## 2015-03-12 DIAGNOSIS — E1142 Type 2 diabetes mellitus with diabetic polyneuropathy: Secondary | ICD-10-CM | POA: Diagnosis not present

## 2015-03-12 DIAGNOSIS — Z139 Encounter for screening, unspecified: Secondary | ICD-10-CM | POA: Diagnosis not present

## 2015-03-12 DIAGNOSIS — Z1389 Encounter for screening for other disorder: Secondary | ICD-10-CM | POA: Diagnosis not present

## 2015-03-12 DIAGNOSIS — N189 Chronic kidney disease, unspecified: Secondary | ICD-10-CM | POA: Diagnosis not present

## 2015-03-12 DIAGNOSIS — M109 Gout, unspecified: Secondary | ICD-10-CM | POA: Diagnosis not present

## 2015-03-12 DIAGNOSIS — I10 Essential (primary) hypertension: Secondary | ICD-10-CM | POA: Diagnosis not present

## 2015-03-12 DIAGNOSIS — E1122 Type 2 diabetes mellitus with diabetic chronic kidney disease: Secondary | ICD-10-CM | POA: Diagnosis not present

## 2015-03-12 DIAGNOSIS — E785 Hyperlipidemia, unspecified: Secondary | ICD-10-CM | POA: Diagnosis not present

## 2015-04-24 DIAGNOSIS — E1169 Type 2 diabetes mellitus with other specified complication: Secondary | ICD-10-CM | POA: Diagnosis not present

## 2015-04-24 DIAGNOSIS — E782 Mixed hyperlipidemia: Secondary | ICD-10-CM | POA: Diagnosis not present

## 2015-04-24 DIAGNOSIS — I10 Essential (primary) hypertension: Secondary | ICD-10-CM | POA: Diagnosis not present

## 2015-04-24 DIAGNOSIS — Z0000 Encounter for general adult medical examination without abnormal findings: Secondary | ICD-10-CM | POA: Diagnosis not present

## 2015-04-24 DIAGNOSIS — E669 Obesity, unspecified: Secondary | ICD-10-CM | POA: Diagnosis not present

## 2015-04-24 DIAGNOSIS — Z6830 Body mass index (BMI) 30.0-30.9, adult: Secondary | ICD-10-CM | POA: Diagnosis not present

## 2015-04-24 DIAGNOSIS — M109 Gout, unspecified: Secondary | ICD-10-CM | POA: Diagnosis not present

## 2015-05-29 DIAGNOSIS — H5210 Myopia, unspecified eye: Secondary | ICD-10-CM | POA: Diagnosis not present

## 2015-06-03 ENCOUNTER — Ambulatory Visit (INDEPENDENT_AMBULATORY_CARE_PROVIDER_SITE_OTHER): Payer: Commercial Managed Care - HMO | Admitting: Podiatry

## 2015-06-03 ENCOUNTER — Encounter: Payer: Self-pay | Admitting: Podiatry

## 2015-06-03 DIAGNOSIS — M79606 Pain in leg, unspecified: Secondary | ICD-10-CM

## 2015-06-03 DIAGNOSIS — Q828 Other specified congenital malformations of skin: Secondary | ICD-10-CM

## 2015-06-03 DIAGNOSIS — E1140 Type 2 diabetes mellitus with diabetic neuropathy, unspecified: Secondary | ICD-10-CM | POA: Diagnosis not present

## 2015-06-03 DIAGNOSIS — B351 Tinea unguium: Secondary | ICD-10-CM | POA: Diagnosis not present

## 2015-06-03 DIAGNOSIS — M79673 Pain in unspecified foot: Secondary | ICD-10-CM | POA: Diagnosis not present

## 2015-06-03 NOTE — Progress Notes (Signed)
Patient ID: Maureen Stout, female   DOB: [birth year], 82 y.o.   MRN: [id number] Complaint:  Visit Type: Patient returns to my office for continued preventative foot care services. Complaint: Patient states" my nails have grown long and thick and become painful to walk and wear shoes.. She presents for preventative foot care services. No changes to ROS. Painful calluses both feet.  Podiatric Exam: Vascular: dorsalis pedis and posterior tibial pulses are palpable bilateral. Capillary return is immediate. Temperature gradient is WNL. Skin turgor WNL  Sensorium: Normal Semmes Weinstein monofilament test. Normal tactile sensation bilaterally. Nail Exam: Pt has thick disfigured discolored nails with subungual debris noted bilateral entire nail hallux through fifth toenails Ulcer Exam: There is no evidence of ulcer or pre-ulcerative changes or infection. Orthopedic Exam: Muscle tone and strength are WNL. No limitations in general ROM. No crepitus or effusions noted. Foot type and digits show no abnormalities. Bony prominences are unremarkable.HAV 1st MPJ B/L. Contracted toes 2-4 B/L. Skin: No Porokeratosis. No infection or ulcers.  .  Callus sub 1st MPJ B/L Healing skin DIPJ second toe right foot.  Healing paronychia medial border right hallux.  Diagnosis:  Tinea unguium, Pain in right toe, pain in left toes,  Callus B/L  Treatment & Plan Procedures and Treatment: Consent by patient was obtained for treatment procedures. The patient understood the discussion of treatment and procedures well. All questions were answered thoroughly reviewed. Debridement of mycotic and hypertrophic toenails, 1 through 5 bilateral and clearing of subungual debris. No ulceration, no infection noted. Debride callus both feet.  Return Visit-Office Procedure: Patient instructed to return to the office for a follow up visit 3 months for continued evaluation and treatment. To consider nail surgery second toenail left foot. Dispense toe  cap.   Gardiner Barefoot DPM

## 2015-06-17 DIAGNOSIS — E1122 Type 2 diabetes mellitus with diabetic chronic kidney disease: Secondary | ICD-10-CM | POA: Diagnosis not present

## 2015-06-17 DIAGNOSIS — E669 Obesity, unspecified: Secondary | ICD-10-CM | POA: Diagnosis not present

## 2015-06-17 DIAGNOSIS — Z6831 Body mass index (BMI) 31.0-31.9, adult: Secondary | ICD-10-CM | POA: Diagnosis not present

## 2015-06-17 DIAGNOSIS — I10 Essential (primary) hypertension: Secondary | ICD-10-CM | POA: Diagnosis not present

## 2015-06-17 DIAGNOSIS — M109 Gout, unspecified: Secondary | ICD-10-CM | POA: Diagnosis not present

## 2015-06-17 DIAGNOSIS — N189 Chronic kidney disease, unspecified: Secondary | ICD-10-CM | POA: Diagnosis not present

## 2015-06-17 DIAGNOSIS — E785 Hyperlipidemia, unspecified: Secondary | ICD-10-CM | POA: Diagnosis not present

## 2015-06-17 DIAGNOSIS — E1142 Type 2 diabetes mellitus with diabetic polyneuropathy: Secondary | ICD-10-CM | POA: Diagnosis not present

## 2015-08-09 DIAGNOSIS — I4891 Unspecified atrial fibrillation: Secondary | ICD-10-CM | POA: Diagnosis not present

## 2015-08-09 DIAGNOSIS — I10 Essential (primary) hypertension: Secondary | ICD-10-CM | POA: Diagnosis not present

## 2015-08-09 DIAGNOSIS — R42 Dizziness and giddiness: Secondary | ICD-10-CM | POA: Diagnosis not present

## 2015-08-09 DIAGNOSIS — Z6831 Body mass index (BMI) 31.0-31.9, adult: Secondary | ICD-10-CM | POA: Diagnosis not present

## 2015-08-09 DIAGNOSIS — E669 Obesity, unspecified: Secondary | ICD-10-CM | POA: Diagnosis not present

## 2015-08-12 ENCOUNTER — Ambulatory Visit: Payer: Self-pay

## 2015-08-12 ENCOUNTER — Encounter: Payer: Self-pay | Admitting: Podiatry

## 2015-08-12 ENCOUNTER — Ambulatory Visit (INDEPENDENT_AMBULATORY_CARE_PROVIDER_SITE_OTHER): Payer: Commercial Managed Care - HMO | Admitting: Podiatry

## 2015-08-12 DIAGNOSIS — B351 Tinea unguium: Secondary | ICD-10-CM

## 2015-08-12 DIAGNOSIS — E1140 Type 2 diabetes mellitus with diabetic neuropathy, unspecified: Secondary | ICD-10-CM

## 2015-08-12 DIAGNOSIS — L84 Corns and callosities: Secondary | ICD-10-CM | POA: Diagnosis not present

## 2015-08-12 DIAGNOSIS — R52 Pain, unspecified: Secondary | ICD-10-CM

## 2015-08-12 DIAGNOSIS — M79606 Pain in leg, unspecified: Secondary | ICD-10-CM

## 2015-08-12 DIAGNOSIS — M79675 Pain in left toe(s): Secondary | ICD-10-CM

## 2015-08-12 DIAGNOSIS — M2010 Hallux valgus (acquired), unspecified foot: Secondary | ICD-10-CM

## 2015-08-12 DIAGNOSIS — I4891 Unspecified atrial fibrillation: Secondary | ICD-10-CM | POA: Diagnosis not present

## 2015-08-12 NOTE — Progress Notes (Signed)
Patient ID: Maureen Stout, female   DOB: 22-May-1931, 82 y.o.   MRN: [id number] Complaint:  Visit Type: Patient returns to my office for continued preventative foot care services. Complaint: Patient states" my nails have grown long and thick and become painful to walk and wear shoes.. She presents for preventative foot care services. No changes to ROS. Painful calluses both feet. Patient has been diagnosed with diabetes with no complications.  Podiatric Exam: Vascular: dorsalis pedis and posterior tibial pulses are palpable bilateral. Capillary return is immediate. Temperature gradient is WNL. Skin turgor WNL  Sensorium: Diminished Semmes Weinstein monofilament test. Normal tactile sensation bilaterally. Nail Exam: Pt has thick disfigured discolored nails with subungual debris noted bilateral entire nail hallux through fifth toenails Ulcer Exam: There is no evidence of ulcer or pre-ulcerative changes or infection. Orthopedic Exam: Muscle tone and strength are WNL. No limitations in general ROM. No crepitus or effusions noted. Foot type and digits show no abnormalities. Bony prominences are unremarkable.HAV 1st MPJ B/L. Contracted toes 2-4 B/L. Skin: No Porokeratosis. No infection or ulcers.  .  Callus sub 1st MPJ B/L Healing skin DIPJ second toe right foot.  Healing paronychia medial border right hallux.  Diagnosis:  Tinea unguium, Pain in right toe, pain in left toes,  Callus B/L  Treatment & Plan Procedures and Treatment: Consent by patient was obtained for treatment procedures. The patient understood the discussion of treatment and procedures well. All questions were answered thoroughly reviewed. Debridement of mycotic and hypertrophic toenails, 1 through 5 bilateral and clearing of subungual debris. No ulceration, no infection noted. Debride callus both feet. To initiate paperwork for diabetic footgear with HAV  B/L and amputation distal aspect third toe left foot. Return Visit-Office Procedure:  Patient instructed to return to the office for a follow up visit 3 months for continued evaluation and treatment.    Gardiner Barefoot DPM

## 2015-08-16 DIAGNOSIS — Z6831 Body mass index (BMI) 31.0-31.9, adult: Secondary | ICD-10-CM | POA: Diagnosis not present

## 2015-08-16 DIAGNOSIS — I482 Chronic atrial fibrillation: Secondary | ICD-10-CM | POA: Diagnosis not present

## 2015-08-19 DIAGNOSIS — I4891 Unspecified atrial fibrillation: Secondary | ICD-10-CM | POA: Diagnosis not present

## 2015-08-20 DIAGNOSIS — I10 Essential (primary) hypertension: Secondary | ICD-10-CM | POA: Diagnosis not present

## 2015-08-21 DIAGNOSIS — I4891 Unspecified atrial fibrillation: Secondary | ICD-10-CM | POA: Diagnosis not present

## 2015-09-10 DIAGNOSIS — I119 Hypertensive heart disease without heart failure: Secondary | ICD-10-CM | POA: Diagnosis not present

## 2015-09-10 DIAGNOSIS — Z7901 Long term (current) use of anticoagulants: Secondary | ICD-10-CM | POA: Diagnosis not present

## 2015-09-10 DIAGNOSIS — Z79899 Other long term (current) drug therapy: Secondary | ICD-10-CM | POA: Diagnosis not present

## 2015-09-10 DIAGNOSIS — I4891 Unspecified atrial fibrillation: Secondary | ICD-10-CM | POA: Diagnosis not present

## 2015-09-16 DIAGNOSIS — I499 Cardiac arrhythmia, unspecified: Secondary | ICD-10-CM | POA: Diagnosis not present

## 2015-09-17 DIAGNOSIS — I4891 Unspecified atrial fibrillation: Secondary | ICD-10-CM | POA: Diagnosis not present

## 2015-09-18 DIAGNOSIS — E1122 Type 2 diabetes mellitus with diabetic chronic kidney disease: Secondary | ICD-10-CM | POA: Diagnosis not present

## 2015-09-18 DIAGNOSIS — I10 Essential (primary) hypertension: Secondary | ICD-10-CM | POA: Diagnosis not present

## 2015-09-18 DIAGNOSIS — I482 Chronic atrial fibrillation: Secondary | ICD-10-CM | POA: Diagnosis not present

## 2015-09-18 DIAGNOSIS — N189 Chronic kidney disease, unspecified: Secondary | ICD-10-CM | POA: Diagnosis not present

## 2015-09-18 DIAGNOSIS — E785 Hyperlipidemia, unspecified: Secondary | ICD-10-CM | POA: Diagnosis not present

## 2015-09-18 DIAGNOSIS — E1142 Type 2 diabetes mellitus with diabetic polyneuropathy: Secondary | ICD-10-CM | POA: Diagnosis not present

## 2015-09-18 DIAGNOSIS — Z6831 Body mass index (BMI) 31.0-31.9, adult: Secondary | ICD-10-CM | POA: Diagnosis not present

## 2015-09-18 DIAGNOSIS — M109 Gout, unspecified: Secondary | ICD-10-CM | POA: Diagnosis not present

## 2015-09-25 ENCOUNTER — Ambulatory Visit: Payer: Commercial Managed Care - HMO | Admitting: Sports Medicine

## 2015-09-25 DIAGNOSIS — E1140 Type 2 diabetes mellitus with diabetic neuropathy, unspecified: Secondary | ICD-10-CM

## 2015-10-30 ENCOUNTER — Ambulatory Visit (INDEPENDENT_AMBULATORY_CARE_PROVIDER_SITE_OTHER): Payer: Commercial Managed Care - HMO | Admitting: Sports Medicine

## 2015-10-30 DIAGNOSIS — M2010 Hallux valgus (acquired), unspecified foot: Secondary | ICD-10-CM

## 2015-10-30 DIAGNOSIS — L84 Corns and callosities: Secondary | ICD-10-CM | POA: Diagnosis not present

## 2015-10-30 DIAGNOSIS — Z89422 Acquired absence of other left toe(s): Secondary | ICD-10-CM

## 2015-10-30 DIAGNOSIS — E1140 Type 2 diabetes mellitus with diabetic neuropathy, unspecified: Secondary | ICD-10-CM | POA: Diagnosis not present

## 2015-11-07 NOTE — Patient Instructions (Signed)

## 2015-11-07 NOTE — Progress Notes (Signed)
Patient ID: KENNADI RECLA, female   DOB: 05-08-1932, 82 y.o.   MRN: [id number] Patient presents to be measured for diabetic shoes and inserts with [facility]. We will call when shoes and inserts arrive.

## 2015-11-07 NOTE — Progress Notes (Signed)
Patient ID: Maureen Stout, female   DOB: 10-14-1931, 82 y.o.   MRN: [id number] Patient presents for diabetic shoe pick up with [facility], shoes are tried on for good fit.  Patient received 1 Pair  Apex X801M  Lace walker black in men's size 9 extra wide and 3 pairs custom molded diabetic inserts.  Verbal and written break in and wear instructions given.  Patient will follow up for scheduled routine care.   Patient discussed with medical assistant. Agree with above. Patient to follow up as scheduled for continued care or sooner if problems or issues arise. -Dr. Cannon Kettle

## 2015-11-18 ENCOUNTER — Ambulatory Visit: Payer: Commercial Managed Care - HMO | Admitting: Podiatry

## 2015-11-25 ENCOUNTER — Encounter: Payer: Self-pay | Admitting: Podiatry

## 2015-11-25 ENCOUNTER — Ambulatory Visit (INDEPENDENT_AMBULATORY_CARE_PROVIDER_SITE_OTHER): Payer: Commercial Managed Care - HMO | Admitting: Podiatry

## 2015-11-25 DIAGNOSIS — B351 Tinea unguium: Secondary | ICD-10-CM

## 2015-11-25 DIAGNOSIS — M79673 Pain in unspecified foot: Secondary | ICD-10-CM

## 2015-11-25 DIAGNOSIS — E1140 Type 2 diabetes mellitus with diabetic neuropathy, unspecified: Secondary | ICD-10-CM

## 2015-11-25 DIAGNOSIS — M79606 Pain in leg, unspecified: Secondary | ICD-10-CM

## 2015-11-25 NOTE — Progress Notes (Signed)
Patient ID: Maureen Stout, female   DOB: 08/02/1931, 82 y.o.   MRN: [id number] Complaint:  Visit Type: Patient returns to my office for continued preventative foot care services. Complaint: Patient states" my nails have grown long and thick and become painful to walk and wear shoes.. She presents for preventative foot care services. No changes to ROS. Painful calluses both feet. Patient has been diagnosed with diabetes with no complications.  Podiatric Exam: Vascular: dorsalis pedis and posterior tibial pulses are palpable bilateral. Capillary return is immediate. Temperature gradient is WNL. Skin turgor WNL  Sensorium: Diminished Semmes Weinstein monofilament test. Normal tactile sensation bilaterally. Nail Exam: Pt has thick disfigured discolored nails with subungual debris noted bilateral entire nail hallux through fifth toenails Ulcer Exam: There is no evidence of ulcer or pre-ulcerative changes or infection. Orthopedic Exam: Muscle tone and strength are WNL. No limitations in general ROM. No crepitus or effusions noted. Foot type and digits show no abnormalities. Bony prominences are unremarkable.HAV 1st MPJ B/L. Contracted toes 2-4 B/L. Skin: No Porokeratosis. No infection or ulcers.  .Asymptomatic   Callus sub 1st MPJ B/L    Diagnosis:  Tinea unguium, Pain in right toe, pain in left toes,    Treatment & Plan Procedures and Treatment: Consent by patient was obtained for treatment procedures. The patient understood the discussion of treatment and procedures well. All questions were answered thoroughly reviewed. Debridement of mycotic and hypertrophic toenails, 1 through 5 bilateral and clearing of subungual debris. No ulceration, no infection noted. Debride callus both feet.     Return Visit-Office Procedure: Patient instructed to return to the office for a follow up visit 3 months for continued evaluation and treatment.    Gardiner Barefoot DPM

## 2015-11-27 ENCOUNTER — Other Ambulatory Visit: Payer: Commercial Managed Care - HMO

## 2015-12-30 DIAGNOSIS — Z79899 Other long term (current) drug therapy: Secondary | ICD-10-CM | POA: Diagnosis not present

## 2015-12-30 DIAGNOSIS — Z9181 History of falling: Secondary | ICD-10-CM | POA: Diagnosis not present

## 2015-12-30 DIAGNOSIS — E785 Hyperlipidemia, unspecified: Secondary | ICD-10-CM | POA: Diagnosis not present

## 2015-12-30 DIAGNOSIS — D638 Anemia in other chronic diseases classified elsewhere: Secondary | ICD-10-CM | POA: Diagnosis not present

## 2015-12-30 DIAGNOSIS — E1142 Type 2 diabetes mellitus with diabetic polyneuropathy: Secondary | ICD-10-CM | POA: Diagnosis not present

## 2015-12-30 DIAGNOSIS — Z139 Encounter for screening, unspecified: Secondary | ICD-10-CM | POA: Diagnosis not present

## 2015-12-30 DIAGNOSIS — Z1389 Encounter for screening for other disorder: Secondary | ICD-10-CM | POA: Diagnosis not present

## 2015-12-30 DIAGNOSIS — E1122 Type 2 diabetes mellitus with diabetic chronic kidney disease: Secondary | ICD-10-CM | POA: Diagnosis not present

## 2015-12-30 DIAGNOSIS — E663 Overweight: Secondary | ICD-10-CM | POA: Diagnosis not present

## 2015-12-30 DIAGNOSIS — M109 Gout, unspecified: Secondary | ICD-10-CM | POA: Diagnosis not present

## 2015-12-30 DIAGNOSIS — N390 Urinary tract infection, site not specified: Secondary | ICD-10-CM | POA: Diagnosis not present

## 2016-01-10 DIAGNOSIS — Z6830 Body mass index (BMI) 30.0-30.9, adult: Secondary | ICD-10-CM | POA: Diagnosis not present

## 2016-01-10 DIAGNOSIS — M25552 Pain in left hip: Secondary | ICD-10-CM | POA: Diagnosis not present

## 2016-01-13 DIAGNOSIS — Z1231 Encounter for screening mammogram for malignant neoplasm of breast: Secondary | ICD-10-CM | POA: Diagnosis not present

## 2016-01-14 DIAGNOSIS — S39012A Strain of muscle, fascia and tendon of lower back, initial encounter: Secondary | ICD-10-CM | POA: Diagnosis not present

## 2016-03-02 ENCOUNTER — Ambulatory Visit (INDEPENDENT_AMBULATORY_CARE_PROVIDER_SITE_OTHER): Payer: Commercial Managed Care - HMO | Admitting: Podiatry

## 2016-03-02 ENCOUNTER — Encounter: Payer: Self-pay | Admitting: Podiatry

## 2016-03-02 VITALS — BP 139/74 | HR 69 | Resp 14

## 2016-03-02 DIAGNOSIS — M79606 Pain in leg, unspecified: Secondary | ICD-10-CM

## 2016-03-02 DIAGNOSIS — B351 Tinea unguium: Secondary | ICD-10-CM

## 2016-03-02 DIAGNOSIS — Z89422 Acquired absence of other left toe(s): Secondary | ICD-10-CM

## 2016-03-02 DIAGNOSIS — M79676 Pain in unspecified toe(s): Secondary | ICD-10-CM | POA: Diagnosis not present

## 2016-03-02 DIAGNOSIS — E1140 Type 2 diabetes mellitus with diabetic neuropathy, unspecified: Secondary | ICD-10-CM

## 2016-03-02 NOTE — Progress Notes (Signed)
Patient ID: Maureen Stout, female   DOB: [birth year], 82 y.o.   MRN: [id number] Complaint:  Visit Type: Patient returns to my office for continued preventative foot care services. Complaint: Patient states" my nails have grown long and thick and become painful to walk and wear shoes.. She presents for preventative foot care services. No changes to ROS. Painful calluses both feet. Patient has been diagnosed with diabetes with no complications.  Podiatric Exam: Vascular: dorsalis pedis and posterior tibial pulses are palpable bilateral. Capillary return is immediate. Temperature gradient is WNL. Skin turgor WNL  Sensorium: Diminished Semmes Weinstein monofilament test. Normal tactile sensation bilaterally. Nail Exam: Pt has thick disfigured discolored nails with subungual debris noted bilateral entire nail hallux through fifth toenails Ulcer Exam: There is no evidence of ulcer or pre-ulcerative changes or infection. Orthopedic Exam: Muscle tone and strength are WNL. No limitations in general ROM. No crepitus or effusions noted. Foot type and digits show no abnormalities. Bony prominences are unremarkable.HAV 1st MPJ B/L. Contracted toes 2-4 B/L. Skin: No Porokeratosis. No infection or ulcers.  .Asymptomatic   Callus sub 1st MPJ B/L    Diagnosis:  Tinea unguium, Pain in right toe, pain in left toes,    Treatment & Plan Procedures and Treatment: Consent by patient was obtained for treatment procedures. The patient understood the discussion of treatment and procedures well. All questions were answered thoroughly reviewed. Debridement of mycotic and hypertrophic toenails, 1 through 5 bilateral and clearing of subungual debris. No ulceration, no infection noted.    Return Visit-Office Procedure: Patient instructed to return to the office for a follow up visit 3 months for continued evaluation and treatment.    Gardiner Barefoot DPM

## 2016-03-25 ENCOUNTER — Ambulatory Visit (INDEPENDENT_AMBULATORY_CARE_PROVIDER_SITE_OTHER): Payer: Commercial Managed Care - HMO | Admitting: Sports Medicine

## 2016-03-25 DIAGNOSIS — L84 Corns and callosities: Secondary | ICD-10-CM

## 2016-03-25 DIAGNOSIS — Z89422 Acquired absence of other left toe(s): Secondary | ICD-10-CM

## 2016-03-25 DIAGNOSIS — M2010 Hallux valgus (acquired), unspecified foot: Secondary | ICD-10-CM

## 2016-03-25 DIAGNOSIS — E1140 Type 2 diabetes mellitus with diabetic neuropathy, unspecified: Secondary | ICD-10-CM

## 2016-03-25 DIAGNOSIS — M79606 Pain in leg, unspecified: Secondary | ICD-10-CM

## 2016-03-25 NOTE — Progress Notes (Signed)
Patient seen by Pedorthist, Benjie Karvonen. Diabetic shoes were re-dispensed to patient.  -Dr. Cannon Kettle

## 2016-03-30 DIAGNOSIS — E785 Hyperlipidemia, unspecified: Secondary | ICD-10-CM | POA: Diagnosis not present

## 2016-03-30 DIAGNOSIS — D638 Anemia in other chronic diseases classified elsewhere: Secondary | ICD-10-CM | POA: Diagnosis not present

## 2016-03-30 DIAGNOSIS — E1122 Type 2 diabetes mellitus with diabetic chronic kidney disease: Secondary | ICD-10-CM | POA: Diagnosis not present

## 2016-03-30 DIAGNOSIS — M109 Gout, unspecified: Secondary | ICD-10-CM | POA: Diagnosis not present

## 2016-03-30 DIAGNOSIS — E039 Hypothyroidism, unspecified: Secondary | ICD-10-CM | POA: Diagnosis not present

## 2016-03-30 DIAGNOSIS — I482 Chronic atrial fibrillation: Secondary | ICD-10-CM | POA: Diagnosis not present

## 2016-03-30 DIAGNOSIS — I129 Hypertensive chronic kidney disease with stage 1 through stage 4 chronic kidney disease, or unspecified chronic kidney disease: Secondary | ICD-10-CM | POA: Diagnosis not present

## 2016-03-30 DIAGNOSIS — Z23 Encounter for immunization: Secondary | ICD-10-CM | POA: Diagnosis not present

## 2016-03-30 DIAGNOSIS — E1142 Type 2 diabetes mellitus with diabetic polyneuropathy: Secondary | ICD-10-CM | POA: Diagnosis not present

## 2016-06-08 ENCOUNTER — Ambulatory Visit: Payer: Commercial Managed Care - HMO | Admitting: Podiatry

## 2016-06-10 ENCOUNTER — Ambulatory Visit (INDEPENDENT_AMBULATORY_CARE_PROVIDER_SITE_OTHER): Payer: Medicare HMO | Admitting: Sports Medicine

## 2016-06-10 DIAGNOSIS — B351 Tinea unguium: Secondary | ICD-10-CM

## 2016-06-10 DIAGNOSIS — M2010 Hallux valgus (acquired), unspecified foot: Secondary | ICD-10-CM

## 2016-06-10 DIAGNOSIS — E1140 Type 2 diabetes mellitus with diabetic neuropathy, unspecified: Secondary | ICD-10-CM

## 2016-06-10 DIAGNOSIS — M2040 Other hammer toe(s) (acquired), unspecified foot: Secondary | ICD-10-CM

## 2016-06-10 NOTE — Progress Notes (Signed)
Subjective: Maureen Stout is a 83 y.o. female patient with history of diabetes who presents to office today complaining of long, painful nails  while ambulating in shoes; unable to trim. Patient states that the glucose reading this morning was 103 mg/dl. Patient denies any new changes in medication or new problems. Patient denies any new cramping, numbness, burning or tingling in the legs.  There are no active problems to display for this patient.  Current Outpatient Prescriptions on File Prior to Visit  Medication Sig Dispense Refill  . allopurinol (ZYLOPRIM) 300 MG tablet     . amoxicillin-clavulanate (AUGMENTIN) 875-125 MG per tablet Take 1 tablet by mouth 2 (two) times daily. 20 tablet 0  . aspirin 81 MG tablet Take 81 mg by mouth daily.    . cephALEXin (KEFLEX) 500 MG capsule Take 1 capsule (500 mg total) by mouth 3 (three) times daily. 30 capsule 0  . ciprofloxacin (CIPRO) 500 MG tablet Take 1 tablet (500 mg total) by mouth 2 (two) times daily. 20 tablet 0  . COLCRYS 0.6 MG tablet     . gemfibrozil (LOPID) 600 MG tablet     . lisinopril (PRINIVIL,ZESTRIL) 20 MG tablet     . Multiple Vitamin (MULTIVITAMIN) tablet Take 1 tablet by mouth daily.    . nabumetone (RELAFEN) 750 MG tablet     . niacin 250 MG tablet Take 250 mg by mouth at bedtime.    . predniSONE (DELTASONE) 20 MG tablet     . simvastatin (ZOCOR) 80 MG tablet      No current facility-administered medications on file prior to visit.    No Known Allergies  No results found for this or any previous visit (from the past 2160 hour(s)).  Objective: General: Patient is awake, alert, and oriented x 3 and in no acute distress.  Integument: Skin is warm, dry and supple bilateral. Nails are tender, long, thickened and dystrophic with subungual debris, consistent with onychomycosis, 1-5 on right 1,2,4,5 on left. No signs of infection. No open lesions or preulcerative lesions present bilateral. Remaining integument  unremarkable.  Vasculature:  Dorsalis Pedis pulse 1/4 bilateral. Posterior Tibial pulse  1/4 bilateral. Capillary fill time <3 sec 1-5 bilateral. Scant hair growth to the level of the digits.Temperature gradient within normal limits. Mild varicosities present bilateral. No edema present bilateral.   Neurology: The patient has absent sensation measured with a 5.07/10g Semmes Weinstein Monofilament at all pedal sites bilateral . Vibratory sensation absent bilateral with tuning fork. No Babinski sign present bilateral.   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 Asymptomatic bunion and hammertoe pedal deformities noted bilateral. Muscular strength 5/5 in all lower extremity muscular groups bilateral without pain on range of motion. No tenderness with calf compression bilateral.  Assessment and Plan: Problem List Items Addressed This Visit    None    Visit Diagnoses    Onychomycosis    -  Primary   Type 2 diabetes, controlled, with neuropathy (HCC)       Hallux abductovalgus, acquired, unspecified laterality       Hammer toe, unspecified laterality          -Examined patient. -Discussed and educated patient on diabetic foot care, especially with  regards to the vascular, neurological and musculoskeletal systems.  -Stressed the importance of good glycemic control and the detriment of not controlling glucose levels in relation to the foot. -Mechanically debrided all nails x9 using sterile nail nipper and filed with dremel without incident  -Answered all patient  questions -Patient to return  In 2.5 months for at risk foot care -Patient advised to call the office if any problems or questions arise in the meantime.  Landis Martins, DPM

## 2016-06-23 DIAGNOSIS — H5203 Hypermetropia, bilateral: Secondary | ICD-10-CM | POA: Diagnosis not present

## 2016-06-26 DIAGNOSIS — Z6830 Body mass index (BMI) 30.0-30.9, adult: Secondary | ICD-10-CM | POA: Diagnosis not present

## 2016-06-26 DIAGNOSIS — N390 Urinary tract infection, site not specified: Secondary | ICD-10-CM | POA: Diagnosis not present

## 2016-07-03 DIAGNOSIS — N390 Urinary tract infection, site not specified: Secondary | ICD-10-CM | POA: Diagnosis not present

## 2016-07-03 DIAGNOSIS — Z6829 Body mass index (BMI) 29.0-29.9, adult: Secondary | ICD-10-CM | POA: Diagnosis not present

## 2016-07-06 DIAGNOSIS — N390 Urinary tract infection, site not specified: Secondary | ICD-10-CM | POA: Diagnosis not present

## 2016-07-06 DIAGNOSIS — D638 Anemia in other chronic diseases classified elsewhere: Secondary | ICD-10-CM | POA: Diagnosis not present

## 2016-07-06 DIAGNOSIS — Z6829 Body mass index (BMI) 29.0-29.9, adult: Secondary | ICD-10-CM | POA: Diagnosis not present

## 2016-07-06 DIAGNOSIS — E785 Hyperlipidemia, unspecified: Secondary | ICD-10-CM | POA: Diagnosis not present

## 2016-07-06 DIAGNOSIS — E1122 Type 2 diabetes mellitus with diabetic chronic kidney disease: Secondary | ICD-10-CM | POA: Diagnosis not present

## 2016-07-06 DIAGNOSIS — I129 Hypertensive chronic kidney disease with stage 1 through stage 4 chronic kidney disease, or unspecified chronic kidney disease: Secondary | ICD-10-CM | POA: Diagnosis not present

## 2016-07-06 DIAGNOSIS — E1142 Type 2 diabetes mellitus with diabetic polyneuropathy: Secondary | ICD-10-CM | POA: Diagnosis not present

## 2016-07-06 DIAGNOSIS — I482 Chronic atrial fibrillation: Secondary | ICD-10-CM | POA: Diagnosis not present

## 2016-07-06 DIAGNOSIS — M109 Gout, unspecified: Secondary | ICD-10-CM | POA: Diagnosis not present

## 2016-07-08 DIAGNOSIS — R32 Unspecified urinary incontinence: Secondary | ICD-10-CM | POA: Diagnosis not present

## 2016-07-08 DIAGNOSIS — R350 Frequency of micturition: Secondary | ICD-10-CM | POA: Diagnosis not present

## 2016-07-08 DIAGNOSIS — N390 Urinary tract infection, site not specified: Secondary | ICD-10-CM | POA: Diagnosis not present

## 2016-07-08 DIAGNOSIS — R319 Hematuria, unspecified: Secondary | ICD-10-CM | POA: Diagnosis not present

## 2016-08-20 ENCOUNTER — Encounter: Payer: Self-pay | Admitting: Sports Medicine

## 2016-08-20 ENCOUNTER — Ambulatory Visit (INDEPENDENT_AMBULATORY_CARE_PROVIDER_SITE_OTHER): Payer: Medicare HMO | Admitting: Sports Medicine

## 2016-08-20 DIAGNOSIS — R8271 Bacteriuria: Secondary | ICD-10-CM | POA: Diagnosis not present

## 2016-08-20 DIAGNOSIS — E1140 Type 2 diabetes mellitus with diabetic neuropathy, unspecified: Secondary | ICD-10-CM

## 2016-08-20 DIAGNOSIS — R350 Frequency of micturition: Secondary | ICD-10-CM | POA: Diagnosis not present

## 2016-08-20 DIAGNOSIS — R32 Unspecified urinary incontinence: Secondary | ICD-10-CM | POA: Diagnosis not present

## 2016-08-20 DIAGNOSIS — L84 Corns and callosities: Secondary | ICD-10-CM | POA: Diagnosis not present

## 2016-08-20 DIAGNOSIS — B351 Tinea unguium: Secondary | ICD-10-CM

## 2016-08-20 DIAGNOSIS — M79606 Pain in leg, unspecified: Secondary | ICD-10-CM

## 2016-08-20 NOTE — Progress Notes (Signed)
Subjective: Maureen Stout is a 83 y.o. female patient with history of diabetes who presents to office today complaining of long, painful nails and calluses  while ambulating in shoes; unable to trim. Patient states that the glucose reading this morning was 93 mg/dl. Patient denies any new changes in medication or new problems. Patient denies any new cramping, numbness, burning or tingling in the legs.  There are no active problems to display for this patient.  Current Outpatient Prescriptions on File Prior to Visit  Medication Sig Dispense Refill  . allopurinol (ZYLOPRIM) 300 MG tablet     . amoxicillin-clavulanate (AUGMENTIN) 875-125 MG per tablet Take 1 tablet by mouth 2 (two) times daily. 20 tablet 0  . aspirin 81 MG tablet Take 81 mg by mouth daily.    . cephALEXin (KEFLEX) 500 MG capsule Take 1 capsule (500 mg total) by mouth 3 (three) times daily. 30 capsule 0  . ciprofloxacin (CIPRO) 500 MG tablet Take 1 tablet (500 mg total) by mouth 2 (two) times daily. 20 tablet 0  . COLCRYS 0.6 MG tablet     . gemfibrozil (LOPID) 600 MG tablet     . lisinopril (PRINIVIL,ZESTRIL) 20 MG tablet     . Multiple Vitamin (MULTIVITAMIN) tablet Take 1 tablet by mouth daily.    . nabumetone (RELAFEN) 750 MG tablet     . niacin 250 MG tablet Take 250 mg by mouth at bedtime.    . predniSONE (DELTASONE) 20 MG tablet     . simvastatin (ZOCOR) 80 MG tablet      No current facility-administered medications on file prior to visit.    No Known Allergies  No results found for this or any previous visit (from the past 2160 hour(s)).  Objective: General: Patient is awake, alert, and oriented x 3 and in no acute distress.  Integument: Skin is warm, dry and supple bilateral. Nails are tender, long, thickened and dystrophic with subungual debris, consistent with onychomycosis, 1-5 on right 1,2,4,5 on left. No signs of infection. No open lesions. + preulcerative lesions present bilateral sub-met 1. Remaining  integument unremarkable.  Vasculature:  Dorsalis Pedis pulse 1/4 bilateral. Posterior Tibial pulse  1/4 bilateral. Capillary fill time <3 sec 1-5 bilateral. Scant hair growth to the level of the digits.Temperature gradient within normal limits. Mild varicosities present bilateral. No edema present bilateral.   Neurology: The patient has absent sensation measured with a 5.07/10g Semmes Weinstein Monofilament at all pedal sites bilateral . Vibratory sensation absent bilateral with tuning fork. No Babinski sign present bilateral.   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 Asymptomatic bunion and hammertoe pedal deformities noted bilateral. Muscular strength 5/5 in all lower extremity muscular groups bilateral without pain on range of motion. No tenderness with calf compression bilateral.  Assessment and Plan: Problem List Items Addressed This Visit    None    Visit Diagnoses    Pre-ulcerative calluses    -  Primary   Onychomycosis       Type 2 diabetes, controlled, with neuropathy (HCC)       Pain of lower extremity, unspecified laterality          -Examined patient. -Discussed and educated patient on diabetic foot care, especially with  regards to the vascular, neurological and musculoskeletal systems.  -Stressed the importance of good glycemic control and the detriment of not controlling glucose levels in relation to the foot. -Mechanically debrided Callus 2 using sterile chisel blade and all nails x9 using sterile nail nipper  and filed with dremel without incident  -Answered all patient questions -Patient to return  In 2.5 months for at risk foot care -Patient advised to call the office if any problems or questions arise in the meantime.  Landis Martins, DPM

## 2016-09-02 DIAGNOSIS — R32 Unspecified urinary incontinence: Secondary | ICD-10-CM | POA: Diagnosis not present

## 2016-09-30 DIAGNOSIS — R32 Unspecified urinary incontinence: Secondary | ICD-10-CM | POA: Diagnosis not present

## 2016-10-05 DIAGNOSIS — Z139 Encounter for screening, unspecified: Secondary | ICD-10-CM | POA: Diagnosis not present

## 2016-10-05 DIAGNOSIS — Z6829 Body mass index (BMI) 29.0-29.9, adult: Secondary | ICD-10-CM | POA: Diagnosis not present

## 2016-10-05 DIAGNOSIS — E1142 Type 2 diabetes mellitus with diabetic polyneuropathy: Secondary | ICD-10-CM | POA: Diagnosis not present

## 2016-10-05 DIAGNOSIS — M109 Gout, unspecified: Secondary | ICD-10-CM | POA: Diagnosis not present

## 2016-10-05 DIAGNOSIS — E785 Hyperlipidemia, unspecified: Secondary | ICD-10-CM | POA: Diagnosis not present

## 2016-10-05 DIAGNOSIS — I482 Chronic atrial fibrillation: Secondary | ICD-10-CM | POA: Diagnosis not present

## 2016-10-05 DIAGNOSIS — I129 Hypertensive chronic kidney disease with stage 1 through stage 4 chronic kidney disease, or unspecified chronic kidney disease: Secondary | ICD-10-CM | POA: Diagnosis not present

## 2016-10-05 DIAGNOSIS — D638 Anemia in other chronic diseases classified elsewhere: Secondary | ICD-10-CM | POA: Diagnosis not present

## 2016-10-05 DIAGNOSIS — E1122 Type 2 diabetes mellitus with diabetic chronic kidney disease: Secondary | ICD-10-CM | POA: Diagnosis not present

## 2016-11-03 DIAGNOSIS — Z87440 Personal history of urinary (tract) infections: Secondary | ICD-10-CM | POA: Diagnosis not present

## 2016-11-03 DIAGNOSIS — N390 Urinary tract infection, site not specified: Secondary | ICD-10-CM | POA: Diagnosis not present

## 2016-11-04 ENCOUNTER — Ambulatory Visit (INDEPENDENT_AMBULATORY_CARE_PROVIDER_SITE_OTHER): Payer: Medicare HMO | Admitting: Sports Medicine

## 2016-11-04 DIAGNOSIS — E1140 Type 2 diabetes mellitus with diabetic neuropathy, unspecified: Secondary | ICD-10-CM | POA: Diagnosis not present

## 2016-11-04 DIAGNOSIS — L84 Corns and callosities: Secondary | ICD-10-CM

## 2016-11-04 DIAGNOSIS — B351 Tinea unguium: Secondary | ICD-10-CM | POA: Diagnosis not present

## 2016-11-04 MED ORDER — SILVER SULFADIAZINE 1 % EX CREA: 1.0000 "application " | TOPICAL_CREAM | Freq: Every day | CUTANEOUS | 0 refills | Status: DC

## 2016-11-04 NOTE — Progress Notes (Signed)
Subjective: Maureen Stout is a 83 y.o. female patient with history of diabetes who presents to office today complaining of long, painful nails and calluses  while ambulating in shoes; unable to trim. Patient states that the glucose reading this morning was 107 mg/dl. Patient admits that she is on antibiotics and has been dealing with kidney infection. Patient denies any new cramping, numbness, burning or tingling in the legs.  There are no active problems to display for this patient.  Current Outpatient Prescriptions on File Prior to Visit  Medication Sig Dispense Refill  . allopurinol (ZYLOPRIM) 300 MG tablet     . amoxicillin-clavulanate (AUGMENTIN) 875-125 MG per tablet Take 1 tablet by mouth 2 (two) times daily. 20 tablet 0  . aspirin 81 MG tablet Take 81 mg by mouth daily.    . cephALEXin (KEFLEX) 500 MG capsule Take 1 capsule (500 mg total) by mouth 3 (three) times daily. 30 capsule 0  . ciprofloxacin (CIPRO) 500 MG tablet Take 1 tablet (500 mg total) by mouth 2 (two) times daily. 20 tablet 0  . COLCRYS 0.6 MG tablet     . gemfibrozil (LOPID) 600 MG tablet     . lisinopril (PRINIVIL,ZESTRIL) 20 MG tablet     . Multiple Vitamin (MULTIVITAMIN) tablet Take 1 tablet by mouth daily.    . nabumetone (RELAFEN) 750 MG tablet     . niacin 250 MG tablet Take 250 mg by mouth at bedtime.    . predniSONE (DELTASONE) 20 MG tablet     . simvastatin (ZOCOR) 80 MG tablet      No current facility-administered medications on file prior to visit.    No Known Allergies  No results found for this or any previous visit (from the past 2160 hour(s)).  Objective: General: Patient is awake, alert, and oriented x 3 and in no acute distress.  Integument: Skin is warm, dry and supple bilateral. Nails are tender, long, thickened and dystrophic with subungual debris, consistent with onychomycosis, 1-5 on right 1,2,4,5 on left. No signs of infection. No open lesions. + preulcerative lesions present bilateral  sub-met 1 and right 4 toe with dry heme. Remaining integument unremarkable.  Vasculature:  Dorsalis Pedis pulse 1/4 bilateral. Posterior Tibial pulse  1/4 bilateral. Capillary fill time <3 sec 1-5 bilateral. Scant hair growth to the level of the digits.Temperature gradient within normal limits. Mild varicosities present bilateral. No edema present bilateral.   Neurology: The patient has absent sensation measured with a 5.07/10g Semmes Weinstein Monofilament at all pedal sites bilateral . Vibratory sensation absent bilateral with tuning fork. No Babinski sign present bilateral.   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 Asymptomatic bunion and hammertoe pedal deformities noted bilateral. Muscular strength 5/5 in all lower extremity muscular groups bilateral without pain on range of motion. No tenderness with calf compression bilateral.  Assessment and Plan: Problem List Items Addressed This Visit    None    Visit Diagnoses    Onychomycosis    -  Primary   Pre-ulcerative calluses       Relevant Medications   silver sulfADIAZINE (SILVADENE) 1 % cream   Type 2 diabetes, controlled, with neuropathy ([place])          -Examined patient. -Discussed and educated patient on diabetic foot care, especially with  regards to the vascular, neurological and musculoskeletal systems.  -Stressed the importance of good glycemic control and the detriment of not controlling glucose levels in relation to the foot. -Mechanically debrided Callus 3 using  sterile chisel blade and all nails x9 using sterile nail nipper and filed with dremel without incident  -Applied offloading pads sub met 1 bilateral and Rx Silvadene cream for patient to use if she sees any bloody drainage; advised patient if this happens to use cream and to return to office sooner for me to check these areas -Long term patient may benefit from Diabetic shoes and custom insoles  -Answered all patient questions -Patient to return  In 2.5  months for at risk foot care -Patient advised to call the office if any problems or questions arise in the meantime.  Landis Martins, DPM

## 2016-11-19 DIAGNOSIS — Z87440 Personal history of urinary (tract) infections: Secondary | ICD-10-CM | POA: Diagnosis not present

## 2016-11-19 DIAGNOSIS — N390 Urinary tract infection, site not specified: Secondary | ICD-10-CM | POA: Diagnosis not present

## 2016-11-26 DIAGNOSIS — Z0000 Encounter for general adult medical examination without abnormal findings: Secondary | ICD-10-CM | POA: Diagnosis not present

## 2016-11-26 DIAGNOSIS — Z1389 Encounter for screening for other disorder: Secondary | ICD-10-CM | POA: Diagnosis not present

## 2016-11-26 DIAGNOSIS — Z136 Encounter for screening for cardiovascular disorders: Secondary | ICD-10-CM | POA: Diagnosis not present

## 2016-11-26 DIAGNOSIS — Z9181 History of falling: Secondary | ICD-10-CM | POA: Diagnosis not present

## 2016-11-26 DIAGNOSIS — Z139 Encounter for screening, unspecified: Secondary | ICD-10-CM | POA: Diagnosis not present

## 2016-11-26 DIAGNOSIS — Z23 Encounter for immunization: Secondary | ICD-10-CM | POA: Diagnosis not present

## 2016-11-26 DIAGNOSIS — Z6830 Body mass index (BMI) 30.0-30.9, adult: Secondary | ICD-10-CM | POA: Diagnosis not present

## 2016-11-26 DIAGNOSIS — E785 Hyperlipidemia, unspecified: Secondary | ICD-10-CM | POA: Diagnosis not present

## 2016-12-01 DIAGNOSIS — R319 Hematuria, unspecified: Secondary | ICD-10-CM | POA: Diagnosis not present

## 2016-12-01 DIAGNOSIS — N390 Urinary tract infection, site not specified: Secondary | ICD-10-CM | POA: Diagnosis not present

## 2016-12-07 DIAGNOSIS — R319 Hematuria, unspecified: Secondary | ICD-10-CM | POA: Diagnosis not present

## 2016-12-07 DIAGNOSIS — N390 Urinary tract infection, site not specified: Secondary | ICD-10-CM | POA: Diagnosis not present

## 2017-01-07 DIAGNOSIS — R319 Hematuria, unspecified: Secondary | ICD-10-CM | POA: Diagnosis not present

## 2017-01-07 DIAGNOSIS — N390 Urinary tract infection, site not specified: Secondary | ICD-10-CM | POA: Diagnosis not present

## 2017-01-13 ENCOUNTER — Ambulatory Visit (INDEPENDENT_AMBULATORY_CARE_PROVIDER_SITE_OTHER): Payer: Medicare HMO | Admitting: Sports Medicine

## 2017-01-13 ENCOUNTER — Encounter: Payer: Self-pay | Admitting: Sports Medicine

## 2017-01-13 ENCOUNTER — Encounter (INDEPENDENT_AMBULATORY_CARE_PROVIDER_SITE_OTHER): Payer: Self-pay

## 2017-01-13 DIAGNOSIS — L97521 Non-pressure chronic ulcer of other part of left foot limited to breakdown of skin: Secondary | ICD-10-CM | POA: Diagnosis not present

## 2017-01-13 DIAGNOSIS — M79606 Pain in leg, unspecified: Secondary | ICD-10-CM

## 2017-01-13 DIAGNOSIS — E1140 Type 2 diabetes mellitus with diabetic neuropathy, unspecified: Secondary | ICD-10-CM

## 2017-01-13 DIAGNOSIS — L97511 Non-pressure chronic ulcer of other part of right foot limited to breakdown of skin: Secondary | ICD-10-CM | POA: Diagnosis not present

## 2017-01-13 DIAGNOSIS — B351 Tinea unguium: Secondary | ICD-10-CM | POA: Diagnosis not present

## 2017-01-13 NOTE — Progress Notes (Signed)
Subjective: Maureen Stout is a 83 y.o. female patient with history of diabetes who presents to office today complaining of long, painful nails and calluses while ambulating in shoes; unable to trim. Patient states that the glucose reading this morning was not recorded; having a issue with kidneys on cipro. Reports that she did have to use silver cream because of drainage from callus but otherwise denies any other symptoms.   There are no active problems to display for this patient.  Current Outpatient Prescriptions on File Prior to Visit  Medication Sig Dispense Refill  . allopurinol (ZYLOPRIM) 300 MG tablet     . amoxicillin-clavulanate (AUGMENTIN) 875-125 MG per tablet Take 1 tablet by mouth 2 (two) times daily. 20 tablet 0  . aspirin 81 MG tablet Take 81 mg by mouth daily.    . cephALEXin (KEFLEX) 500 MG capsule Take 1 capsule (500 mg total) by mouth 3 (three) times daily. 30 capsule 0  . ciprofloxacin (CIPRO) 500 MG tablet Take 1 tablet (500 mg total) by mouth 2 (two) times daily. 20 tablet 0  . COLCRYS 0.6 MG tablet     . gemfibrozil (LOPID) 600 MG tablet     . lisinopril (PRINIVIL,ZESTRIL) 20 MG tablet     . Multiple Vitamin (MULTIVITAMIN) tablet Take 1 tablet by mouth daily.    . nabumetone (RELAFEN) 750 MG tablet     . niacin 250 MG tablet Take 250 mg by mouth at bedtime.    . predniSONE (DELTASONE) 20 MG tablet     . silver sulfADIAZINE (SILVADENE) 1 % cream Apply 1 application topically daily. 50 g 0  . simvastatin (ZOCOR) 80 MG tablet      No current facility-administered medications on file prior to visit.    No Known Allergies  No results found for this or any previous visit (from the past 2160 hour(s)).  Objective: General: Patient is awake, alert, and oriented x 3 and in no acute distress.  Integument: Skin is warm, dry and supple bilateral. Nails are tender, long, thickened and dystrophic with subungual debris, consistent with onychomycosis, 1-5 on right 1,2,4,5 on left.  No signs of infection. No open lesions. + ulceration in area of previous callus bilateral sub-met 1 that measures 1cm x0.3x0.2cm on left post debridement and 1x0.8x.1cm on right post debridement with granular base with no surrounding signs of infection. Remaining integument unremarkable.  Vasculature:  Dorsalis Pedis pulse 1/4 bilateral. Posterior Tibial pulse  1/4 bilateral. Capillary fill time <3 sec 1-5 bilateral. Scant hair growth to the level of the digits.Temperature gradient within normal limits. Mild varicosities present bilateral. No edema present bilateral.   Neurology: The patient has absent sensation measured with a 5.07/10g Semmes Weinstein Monofilament at all pedal sites bilateral . Vibratory sensation absent bilateral with tuning fork. No Babinski sign present bilateral.   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 Asymptomatic bunion and hammertoe pedal deformities noted bilateral. Muscular strength 5/5 in all lower extremity muscular groups bilateral without pain on range of motion. No tenderness with calf compression bilateral.  Assessment and Plan: Problem List Items Addressed This Visit    None    Visit Diagnoses    Onychomycosis    -  Primary   Foot ulcer, left, limited to breakdown of skin ([place])       Foot ulcer, limited to breakdown of skin, right (HCC)       Pain of lower extremity, unspecified laterality       Type 2 diabetes, controlled, with  neuropathy [facility])          -Examined patient. -Discussed and educated patient on diabetic foot care, especially with  regards to the vascular, neurological and musculoskeletal systems.  -Stressed the importance of good glycemic control and the detriment of not controlling glucose levels in relation to the foot. -Mechanically debrided all nails x9 using sterile nail nipper and filed with dremel without incident  - Excisionally dedbrided ulceration at sub met 1 bilateral to healthy bleeding borders removing nonviable  tissue using a sterile chisel blade. Wound measures post debridement as above. Wound was debrided to the level of the dermis with viable wound base exposed to promote healing. Hemostasis was achieved with manuel pressure. Patient tolerated procedure well without any discomfort or anesthesia necessary for this wound debridement.  -Applied silavdene and dry sterile dressing and instructed patient to continue with daily dressings at home consisting of the same with offloading padding - Advised patient to go to the ER or return to office if the wound worsens or if constitutional symptoms are present. -[employer] diabetic shoe order form was completed; office to contact primary care for approval / certification;  Office to arrange shoe fitting and dispensing. -Answered all patient questions -Patient to return in 3 weeks for follow up wound care  -Patient advised to call the office if any problems or questions arise in the meantime.  Landis Martins, DPM

## 2017-01-14 DIAGNOSIS — Z139 Encounter for screening, unspecified: Secondary | ICD-10-CM | POA: Diagnosis not present

## 2017-01-14 DIAGNOSIS — E785 Hyperlipidemia, unspecified: Secondary | ICD-10-CM | POA: Diagnosis not present

## 2017-01-14 DIAGNOSIS — E1122 Type 2 diabetes mellitus with diabetic chronic kidney disease: Secondary | ICD-10-CM | POA: Diagnosis not present

## 2017-01-14 DIAGNOSIS — I482 Chronic atrial fibrillation: Secondary | ICD-10-CM | POA: Diagnosis not present

## 2017-01-14 DIAGNOSIS — M8589 Other specified disorders of bone density and structure, multiple sites: Secondary | ICD-10-CM | POA: Diagnosis not present

## 2017-01-14 DIAGNOSIS — D638 Anemia in other chronic diseases classified elsewhere: Secondary | ICD-10-CM | POA: Diagnosis not present

## 2017-01-14 DIAGNOSIS — M109 Gout, unspecified: Secondary | ICD-10-CM | POA: Diagnosis not present

## 2017-01-14 DIAGNOSIS — E1142 Type 2 diabetes mellitus with diabetic polyneuropathy: Secondary | ICD-10-CM | POA: Diagnosis not present

## 2017-01-14 DIAGNOSIS — I129 Hypertensive chronic kidney disease with stage 1 through stage 4 chronic kidney disease, or unspecified chronic kidney disease: Secondary | ICD-10-CM | POA: Diagnosis not present

## 2017-02-08 DIAGNOSIS — Z1231 Encounter for screening mammogram for malignant neoplasm of breast: Secondary | ICD-10-CM | POA: Diagnosis not present

## 2017-02-08 DIAGNOSIS — M8589 Other specified disorders of bone density and structure, multiple sites: Secondary | ICD-10-CM | POA: Diagnosis not present

## 2017-02-10 ENCOUNTER — Ambulatory Visit (INDEPENDENT_AMBULATORY_CARE_PROVIDER_SITE_OTHER): Payer: Medicare HMO | Admitting: Sports Medicine

## 2017-02-10 DIAGNOSIS — L97521 Non-pressure chronic ulcer of other part of left foot limited to breakdown of skin: Secondary | ICD-10-CM | POA: Diagnosis not present

## 2017-02-10 DIAGNOSIS — E1140 Type 2 diabetes mellitus with diabetic neuropathy, unspecified: Secondary | ICD-10-CM | POA: Diagnosis not present

## 2017-02-10 DIAGNOSIS — M79606 Pain in leg, unspecified: Secondary | ICD-10-CM

## 2017-02-10 DIAGNOSIS — L97511 Non-pressure chronic ulcer of other part of right foot limited to breakdown of skin: Secondary | ICD-10-CM

## 2017-02-10 NOTE — Progress Notes (Signed)
Subjective: Maureen Stout is a 83 y.o. female patient with history of diabetes who returns to office today for evaluation of bilateral foot ulcerations. Reports that she has been dressing areas with silvadene cream. States that she had 1 episode of bleeding from these areas after she walked barefoot. Patient denies nausea, vomiting, fever chills or any other consitutional symptoms.  FBS this AM- 109   There are no active problems to display for this patient.  Current Outpatient Prescriptions on File Prior to Visit  Medication Sig Dispense Refill  . allopurinol (ZYLOPRIM) 300 MG tablet     . amoxicillin-clavulanate (AUGMENTIN) 875-125 MG per tablet Take 1 tablet by mouth 2 (two) times daily. (Patient not taking: Reported on 01/13/2017) 20 tablet 0  . aspirin 81 MG tablet Take 81 mg by mouth daily.    . cephALEXin (KEFLEX) 500 MG capsule Take 1 capsule (500 mg total) by mouth 3 (three) times daily. (Patient not taking: Reported on 01/13/2017) 30 capsule 0  . ciprofloxacin (CIPRO) 500 MG tablet Take 1 tablet (500 mg total) by mouth 2 (two) times daily. (Patient not taking: Reported on 01/13/2017) 20 tablet 0  . COLCRYS 0.6 MG tablet     . gemfibrozil (LOPID) 600 MG tablet     . lisinopril (PRINIVIL,ZESTRIL) 20 MG tablet     . Multiple Vitamin (MULTIVITAMIN) tablet Take 1 tablet by mouth daily.    . nabumetone (RELAFEN) 750 MG tablet     . niacin 250 MG tablet Take 250 mg by mouth at bedtime.    . predniSONE (DELTASONE) 20 MG tablet     . silver sulfADIAZINE (SILVADENE) 1 % cream Apply 1 application topically daily. 50 g 0  . simvastatin (ZOCOR) 80 MG tablet      No current facility-administered medications on file prior to visit.    No Known Allergies  No results found for this or any previous visit (from the past 2160 hour(s)).  Objective: General: Patient is awake, alert, and oriented x 3 and in no acute distress.  Integument: Skin is warm, dry and supple bilateral. Nails are short,  hickened and dystrophic with subungual debris, consistent with onychomycosis, 1-5 on right 1,2,4,5 on left. No signs of infection. + ulceration in area of previous callus bilateral sub-met 1 that measures 1cm x0.3x0.1cm on left post debridement and 0.3x0.3x0.1cm on right post debridement (smaller than previous) with granular base with no surrounding signs of infection. Remaining integument unremarkable.  Vasculature:  Dorsalis Pedis pulse 1/4 bilateral. Posterior Tibial pulse  1/4 bilateral. Capillary fill time <3 sec 1-5 bilateral. Scant hair growth to the level of the digits.Temperature gradient within normal limits. Mild varicosities present bilateral. No edema present bilateral.   Neurology: The patient has absent sensation measured with a 5.07/10g Semmes Weinstein Monofilament at all pedal sites bilateral . Vibratory sensation absent bilateral with tuning fork. No Babinski sign present bilateral.   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 Asymptomatic bunion and hammertoe pedal deformities noted bilateral. Muscular strength 5/5 in all lower extremity muscular groups bilateral without pain on range of motion. No tenderness with calf compression bilateral.  Assessment and Plan: Problem List Items Addressed This Visit    None    Visit Diagnoses    Foot ulcer, left, limited to breakdown of skin ([place])    -  Primary   Foot ulcer, limited to breakdown of skin, right (HCC)       Pain of lower extremity, unspecified laterality  Type 2 diabetes, controlled, with neuropathy ([place])          -Examined patient. -Discussed and educated patient on diabetic foot care, especially with  regards to the vascular, neurological and musculoskeletal systems.  -Stressed the importance of good glycemic control and the detriment of not controlling glucose levels in relation to the foot. - Excisionally dedbrided ulcerations sub met 1 bilateral to healthy bleeding borders removing nonviable tissue using  a sterile chisel blade. Wound measures post debridement as above. Wound was debrided to the level of the dermis with viable wound base exposed to promote healing. Hemostasis was achieved with manuel pressure. Patient tolerated procedure well without any discomfort or anesthesia necessary for this wound debridement.  -Applied silavdene and dry sterile dressing and instructed patient to continue with daily dressings at home consisting of the same with offloading padding - Advised patient to go to the ER or return to office if the wound worsens or if constitutional symptoms are present. -Awaiting diabetic shoe measurements  -Answered all patient questions -Patient to return in 4 weeks for follow up wound care  -Patient advised to call the office if any problems or questions arise in the meantime.  Landis Martins, DPM

## 2017-02-16 DIAGNOSIS — R310 Gross hematuria: Secondary | ICD-10-CM | POA: Diagnosis not present

## 2017-02-16 DIAGNOSIS — R8271 Bacteriuria: Secondary | ICD-10-CM | POA: Diagnosis not present

## 2017-02-16 DIAGNOSIS — Z87440 Personal history of urinary (tract) infections: Secondary | ICD-10-CM | POA: Diagnosis not present

## 2017-02-16 DIAGNOSIS — R350 Frequency of micturition: Secondary | ICD-10-CM | POA: Diagnosis not present

## 2017-02-16 DIAGNOSIS — N390 Urinary tract infection, site not specified: Secondary | ICD-10-CM | POA: Diagnosis not present

## 2017-02-16 DIAGNOSIS — R32 Unspecified urinary incontinence: Secondary | ICD-10-CM | POA: Diagnosis not present

## 2017-02-25 DIAGNOSIS — R32 Unspecified urinary incontinence: Secondary | ICD-10-CM | POA: Diagnosis not present

## 2017-02-25 DIAGNOSIS — N390 Urinary tract infection, site not specified: Secondary | ICD-10-CM | POA: Diagnosis not present

## 2017-03-24 ENCOUNTER — Encounter: Payer: Self-pay | Admitting: Sports Medicine

## 2017-03-24 ENCOUNTER — Encounter (INDEPENDENT_AMBULATORY_CARE_PROVIDER_SITE_OTHER): Payer: Self-pay

## 2017-03-24 ENCOUNTER — Ambulatory Visit (INDEPENDENT_AMBULATORY_CARE_PROVIDER_SITE_OTHER): Payer: Medicare HMO | Admitting: Sports Medicine

## 2017-03-24 DIAGNOSIS — L97511 Non-pressure chronic ulcer of other part of right foot limited to breakdown of skin: Secondary | ICD-10-CM

## 2017-03-24 DIAGNOSIS — M79606 Pain in leg, unspecified: Secondary | ICD-10-CM | POA: Diagnosis not present

## 2017-03-24 DIAGNOSIS — L97521 Non-pressure chronic ulcer of other part of left foot limited to breakdown of skin: Secondary | ICD-10-CM | POA: Diagnosis not present

## 2017-03-24 DIAGNOSIS — E1140 Type 2 diabetes mellitus with diabetic neuropathy, unspecified: Secondary | ICD-10-CM | POA: Diagnosis not present

## 2017-03-24 DIAGNOSIS — B351 Tinea unguium: Secondary | ICD-10-CM

## 2017-03-24 NOTE — Progress Notes (Signed)
Subjective: Maureen Stout is a 83 y.o. female patient with history of diabetes who returns to office today for evaluation of bilateral foot ulcerations. Reports that she has been dressing areas with silvadene cream. States that they seem to be getting better and that she has stopped walking barefoot. Desires nail trim. Patient denies nausea, vomiting, fever chills or any other consitutional symptoms.  FBS this AM- 100  There are no active problems to display for this patient.  Current Outpatient Medications on File Prior to Visit  Medication Sig Dispense Refill  . allopurinol (ZYLOPRIM) 300 MG tablet     . aspirin 81 MG tablet Take 81 mg by mouth daily.    Marland Kitchen COLCRYS 0.6 MG tablet     . gemfibrozil (LOPID) 600 MG tablet     . lisinopril (PRINIVIL,ZESTRIL) 20 MG tablet     . Multiple Vitamin (MULTIVITAMIN) tablet Take 1 tablet by mouth daily.    . nabumetone (RELAFEN) 750 MG tablet     . niacin 250 MG tablet Take 250 mg by mouth at bedtime.    . predniSONE (DELTASONE) 20 MG tablet     . silver sulfADIAZINE (SILVADENE) 1 % cream Apply 1 application topically daily. 50 g 0  . simvastatin (ZOCOR) 80 MG tablet     . amoxicillin-clavulanate (AUGMENTIN) 875-125 MG per tablet Take 1 tablet by mouth 2 (two) times daily. (Patient not taking: Reported on 01/13/2017) 20 tablet 0  . cephALEXin (KEFLEX) 500 MG capsule Take 1 capsule (500 mg total) by mouth 3 (three) times daily. (Patient not taking: Reported on 01/13/2017) 30 capsule 0  . ciprofloxacin (CIPRO) 500 MG tablet Take 1 tablet (500 mg total) by mouth 2 (two) times daily. (Patient not taking: Reported on 01/13/2017) 20 tablet 0   No current facility-administered medications on file prior to visit.    No Known Allergies  No results found for this or any previous visit (from the past 2160 hour(s)).  Objective: General: Patient is awake, alert, and oriented x 3 and in no acute distress.  Integument: Skin is warm, dry and supple bilateral. Nails  are long, thickened and dystrophic with subungual debris, consistent with onychomycosis, 1-5 on right 1,2,4,5 on left. No signs of infection. + ulceration in area of previous callus bilateral sub-met 1 that measures 0.3cm x0.3x0.1cm on left post debridement and 0.3x0.5x0.1cm on right post debridement (smaller than previous) with granular base with no surrounding signs of infection. Remaining integument unremarkable.  Vasculature:  Dorsalis Pedis pulse 1/4 bilateral. Posterior Tibial pulse  1/4 bilateral. Capillary fill time <3 sec 1-5 bilateral. Scant hair growth to the level of the digits.Temperature gradient within normal limits. Mild varicosities present bilateral. No edema present bilateral.   Neurology: The patient has absent sensation measured with a 5.07/10g Semmes Weinstein Monofilament at all pedal sites bilateral . Vibratory sensation absent bilateral with tuning fork. No Babinski sign present bilateral.   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 Asymptomatic bunion and hammertoe pedal deformities noted bilateral. Muscular strength 5/5 in all lower extremity muscular groups bilateral without pain on range of motion. No tenderness with calf compression bilateral.  Assessment and Plan: Problem List Items Addressed This Visit    None    Visit Diagnoses    Foot ulcer, left, limited to breakdown of skin ([place])    -  Primary   Foot ulcer, limited to breakdown of skin, right (HCC)       Onychomycosis       Pain of lower  extremity, unspecified laterality       Type 2 diabetes, controlled, with neuropathy ([place])          -Examined patient. -Discussed and educated patient on diabetic foot care, especially with  regards to the vascular, neurological and musculoskeletal systems.  -Stressed the importance of good glycemic control and the detriment of not controlling glucose levels in relation to the foot. - Excisionally dedbrided ulcerations sub met 1 bilateral to healthy bleeding  borders removing nonviable tissue using a sterile chisel blade. Wound measures post debridement as above. Wound was debrided to the level of the dermis with viable wound base exposed to promote healing. Hemostasis was achieved with manuel pressure. Patient tolerated procedure well without any discomfort or anesthesia necessary for this wound debridement.  -Applied silavdene and dry sterile dressing and instructed patient to continue with daily dressings at home consisting of the same with offloading padding - Advised patient to go to the ER or return to office if the wound worsens or if constitutional symptoms are present. -Mechanically debrided all nails using sterile nail nipper without incident. -Answered all patient questions -Patient to return in 4-6 weeks for follow up wound care  -Patient advised to call the office if any problems or questions arise in the meantime.  Landis Martins, DPM

## 2017-03-25 DIAGNOSIS — N390 Urinary tract infection, site not specified: Secondary | ICD-10-CM | POA: Diagnosis not present

## 2017-03-25 DIAGNOSIS — R319 Hematuria, unspecified: Secondary | ICD-10-CM | POA: Diagnosis not present

## 2017-03-25 DIAGNOSIS — N3941 Urge incontinence: Secondary | ICD-10-CM | POA: Diagnosis not present

## 2017-03-25 DIAGNOSIS — Q632 Ectopic kidney: Secondary | ICD-10-CM | POA: Diagnosis not present

## 2017-03-25 DIAGNOSIS — N200 Calculus of kidney: Secondary | ICD-10-CM | POA: Diagnosis not present

## 2017-03-25 DIAGNOSIS — N3289 Other specified disorders of bladder: Secondary | ICD-10-CM | POA: Diagnosis not present

## 2017-04-18 ENCOUNTER — Inpatient Hospital Stay (HOSPITAL_COMMUNITY)
Admission: EM | Admit: 2017-04-18 | Discharge: 2017-04-19 | DRG: 309 | Disposition: A | Discharge status: Home / Self Care | Payer: Medicare Other | Attending: Cardiovascular Disease | Admitting: Cardiovascular Disease

## 2017-04-18 ENCOUNTER — Encounter (HOSPITAL_COMMUNITY): Payer: Self-pay

## 2017-04-18 ENCOUNTER — Other Ambulatory Visit: Payer: Self-pay

## 2017-04-18 ENCOUNTER — Inpatient Hospital Stay (HOSPITAL_COMMUNITY): Payer: Medicare Other

## 2017-04-18 DIAGNOSIS — I480 Paroxysmal atrial fibrillation: Secondary | ICD-10-CM

## 2017-04-18 DIAGNOSIS — R42 Dizziness and giddiness: Secondary | ICD-10-CM | POA: Diagnosis not present

## 2017-04-18 DIAGNOSIS — N179 Acute kidney failure, unspecified: Secondary | ICD-10-CM | POA: Diagnosis present

## 2017-04-18 DIAGNOSIS — R55 Syncope and collapse: Secondary | ICD-10-CM | POA: Diagnosis not present

## 2017-04-18 DIAGNOSIS — I10 Essential (primary) hypertension: Secondary | ICD-10-CM | POA: Diagnosis present

## 2017-04-18 DIAGNOSIS — E119 Type 2 diabetes mellitus without complications: Secondary | ICD-10-CM | POA: Diagnosis present

## 2017-04-18 DIAGNOSIS — J9811 Atelectasis: Secondary | ICD-10-CM | POA: Diagnosis not present

## 2017-04-18 DIAGNOSIS — R000 Tachycardia, unspecified: Secondary | ICD-10-CM

## 2017-04-18 DIAGNOSIS — R404 Transient alteration of awareness: Secondary | ICD-10-CM | POA: Diagnosis not present

## 2017-04-18 DIAGNOSIS — E785 Hyperlipidemia, unspecified: Secondary | ICD-10-CM | POA: Diagnosis present

## 2017-04-18 DIAGNOSIS — Z7901 Long term (current) use of anticoagulants: Secondary | ICD-10-CM | POA: Diagnosis not present

## 2017-04-18 DIAGNOSIS — I4891 Unspecified atrial fibrillation: Secondary | ICD-10-CM

## 2017-04-18 DIAGNOSIS — L8990 Pressure ulcer of unspecified site, unspecified stage: Secondary | ICD-10-CM

## 2017-04-18 HISTORY — DX: Paroxysmal atrial fibrillation: I48.0

## 2017-04-18 HISTORY — DX: Essential (primary) hypertension: I10

## 2017-04-18 HISTORY — DX: Hyperlipidemia, unspecified: E78.5

## 2017-04-18 LAB — COMPREHENSIVE METABOLIC PANEL
ALT: 13 U/L — ABNORMAL LOW (ref 14–54)
AST: 25 U/L (ref 15–41)
Albumin: 3.8 g/dL (ref 3.5–5.0)
Alkaline Phosphatase: 67 U/L (ref 38–126)
Anion gap: 9 (ref 5–15)
BILIRUBIN TOTAL: 0.8 mg/dL (ref 0.3–1.2)
BUN: 28 mg/dL — AB (ref 6–20)
CHLORIDE: 104 mmol/L (ref 101–111)
CO2: 25 mmol/L (ref 22–32)
Calcium: 9.9 mg/dL (ref 8.9–10.3)
Creatinine, Ser: 1.4 mg/dL — ABNORMAL HIGH (ref 0.44–1.00)
GFR, EST AFRICAN AMERICAN: 39 mL/min — AB (ref >60)
GFR, EST NON AFRICAN AMERICAN: 34 mL/min — AB (ref >60)
Glucose, Bld: 161 mg/dL — ABNORMAL HIGH (ref 65–99)
POTASSIUM: 3.8 mmol/L (ref 3.5–5.1)
Sodium: 138 mmol/L (ref 135–145)
TOTAL PROTEIN: 6.4 g/dL — AB (ref 6.5–8.1)

## 2017-04-18 LAB — URINALYSIS, ROUTINE W REFLEX MICROSCOPIC
BILIRUBIN URINE: NEGATIVE
GLUCOSE, UA: NEGATIVE mg/dL
Ketones, ur: NEGATIVE mg/dL
Nitrite: NEGATIVE
PH: 5 (ref 5.0–8.0)
Protein, ur: NEGATIVE mg/dL
SPECIFIC GRAVITY, URINE: 1.011 (ref 1.005–1.030)

## 2017-04-18 LAB — CBC
HEMATOCRIT: 36.2 % (ref 36.0–46.0)
Hemoglobin: 11.8 g/dL — ABNORMAL LOW (ref 12.0–15.0)
MCH: 31.6 pg (ref 26.0–34.0)
MCHC: 32.6 g/dL (ref 30.0–36.0)
MCV: 96.8 fL (ref 78.0–100.0)
PLATELETS: 194 10*3/uL (ref 150–400)
RBC: 3.74 MIL/uL — ABNORMAL LOW (ref 3.87–5.11)
RDW: 14 % (ref 11.5–15.5)
WBC: 8.2 10*3/uL (ref 4.0–10.5)

## 2017-04-18 LAB — TROPONIN I
TROPONIN I: 0.06 ng/mL — AB (ref <0.03)
Troponin I: 0.03 ng/mL (ref <0.03)

## 2017-04-18 LAB — MAGNESIUM: MAGNESIUM: 1.4 mg/dL — AB (ref 1.7–2.4)

## 2017-04-18 LAB — GLUCOSE, CAPILLARY: Glucose-Capillary: 121 mg/dL — ABNORMAL HIGH (ref 65–99)

## 2017-04-18 LAB — TSH: TSH: 2.898 u[IU]/mL (ref 0.350–4.500)

## 2017-04-18 MED ORDER — ACETAMINOPHEN 325 MG PO TABS: 650.0000 mg | ORAL_TABLET | ORAL | Status: DC | PRN

## 2017-04-18 MED ORDER — ATORVASTATIN CALCIUM 10 MG PO TABS
10.0000 mg | ORAL_TABLET | Freq: Every day | ORAL | Status: DC
  Administered 2017-04-18: 10 mg via ORAL
  Filled 2017-04-18: qty 1

## 2017-04-18 MED ORDER — METOPROLOL TARTRATE 50 MG PO TABS
50.0000 mg | ORAL_TABLET | Freq: Two times a day (BID) | ORAL | Status: DC
  Administered 2017-04-18 – 2017-04-19 (×2): 50 mg via ORAL
  Filled 2017-04-18 (×2): qty 1

## 2017-04-18 MED ORDER — ONDANSETRON HCL 4 MG/2ML IJ SOLN: 4.0000 mg | Freq: Four times a day (QID) | INTRAMUSCULAR | Status: DC | PRN

## 2017-04-18 MED ORDER — METOPROLOL TARTRATE 5 MG/5ML IV SOLN: 2.5000 mg | INTRAVENOUS | Status: AC

## 2017-04-18 MED ORDER — DILTIAZEM HCL 100 MG IV SOLR
5.0000 mg/h | INTRAVENOUS | Status: DC
  Administered 2017-04-18: 5 mg/h via INTRAVENOUS
  Filled 2017-04-18: qty 100

## 2017-04-18 MED ORDER — APIXABAN 5 MG PO TABS
5.0000 mg | ORAL_TABLET | Freq: Two times a day (BID) | ORAL | Status: DC
  Administered 2017-04-18 – 2017-04-19 (×2): 5 mg via ORAL
  Filled 2017-04-18 (×2): qty 1

## 2017-04-18 MED ORDER — ALLOPURINOL 300 MG PO TABS
300.0000 mg | ORAL_TABLET | Freq: Every day | ORAL | Status: DC
  Administered 2017-04-18: 300 mg via ORAL
  Filled 2017-04-18: qty 1

## 2017-04-18 MED ORDER — GEMFIBROZIL 600 MG PO TABS
600.0000 mg | ORAL_TABLET | Freq: Two times a day (BID) | ORAL | Status: DC
  Administered 2017-04-18 – 2017-04-19 (×2): 600 mg via ORAL
  Filled 2017-04-18 (×3): qty 1

## 2017-04-18 MED ORDER — DILTIAZEM LOAD VIA INFUSION
15.0000 mg | Freq: Once | INTRAVENOUS | Status: AC
  Administered 2017-04-18: 15 mg via INTRAVENOUS
  Filled 2017-04-18: qty 15

## 2017-04-18 NOTE — ED Triage Notes (Signed)
Pt brought in by [facility] for sudden onset of weakness and dizziness. Pt was walking around at [employer] when all of a sudden she felt like she was going to pass out. Pt has hx of afib, but per pt she has never been in [facility]. Pt HR with EMS was 150, given 6 mg adenosine, pt converted to a HR of 95. 10 seconds later pt HR jumped back to 150. Pt currently c/o dizziness.

## 2017-04-18 NOTE — Progress Notes (Addendum)
Cardiology Admission History and Physical:   Patient ID: Maureen Stout; MRN: [id number]; DOB: Mar 21, 1932   Admission date: 04/18/2017  Primary Care Provider: Nicoletta Dress, MD Primary Cardiologist: [facility] Primary Electrophysiologist:  n/a  Chief Complaint:  Weakness, near-syncope  Patient Profile:   Maureen Stout is a 83 y.o. female with a history of paroxysmal atrial fibrillation presenting today for presyncope associated with atrial fibrillation rapid ventricular response.  History of Present Illness:   Maureen Stout is usually in a good state of health and a very active 83 year old lady. She was shopping at [employer] today when she had sudden onset of severe weakness and dizziness with a minimal subjective palpitations. She was found to be in atrial fibrillation with rapid ventricular response around 150 bpm and feels better with in the emergency room with rate control medication. She still feels dizzy if he tries to sit up and denies any problems with dyspnea, chest pain, palpitations or any recent intercurrent illness.  She has only been taking liquids 2.5 mg once daily. Reportedly she was told to do so by her primary care physician, but the most recent instructions in the computer showed this to be a twice daily prescription. I'm not sure why she was prescribed a lower dose of L Chris. She is elderly but does not have a low body mass. Her creatinine is still pending, but she denies problems with kidney disease. Labs from August 2018 show creatinine of 1.1 in [facility].  A transthoracic echo is noted to be performed in April 2017, but the report and the images cannot be retrieved. A 24 Holter monitor performed in 2017 showed a burden of atrial fibrillation of roughly 3% with episodes of RVR. When last seen by Dr. Bettina Gavia in April 2017 multi was initiated but she is no longer taking this medication. I don't think she has followed up with him since.  Additional medical problems include  hyperlipidemia and hypertension and type 2 diabetes mellitus on oral antidiabetics. She weighs approximately 180 pounds, borderline obese. She denies a history of bleeding problems. She has previously undergone hysterectomy.   Past Medical History:  Diagnosis Date  . A-fib ([place])   . Bladder problem   . Diabetes mellitus without complication [facility])     Past Surgical History:  Procedure Laterality Date  . TOE SURGERY     Hysterectomy  Medications Prior to Admission: Prior to Admission medications   Medication Sig Start Date End Date Taking? Authorizing Provider  allopurinol (ZYLOPRIM) 300 MG tablet Take 300 mg by mouth at bedtime.  11/05/14  Yes [provider]  Alogliptin Benzoate (NESINA) 25 MG TABS Take 25 mg by mouth at bedtime.   Yes [provider]  apixaban (ELIQUIS) 5 MG TABS tablet Take 2.5 mg by mouth at bedtime.   Yes [provider]  atorvastatin (LIPITOR) 10 MG tablet Take 10 mg by mouth at bedtime. 03/18/17  Yes [provider]  Calcium Carbonate-Vitamin D (CALCIUM-D PO) Take 1 tablet by mouth 2 (two) times daily.   Yes [provider]  gemfibrozil (LOPID) 600 MG tablet Take 600 mg by mouth 2 (two) times daily.  02/02/13  Yes [provider]  metoprolol tartrate (LOPRESSOR) 25 MG tablet Take 25 mg by mouth 2 (two) times daily. 04/16/17  Yes [provider]  Multiple Vitamin (MULTIVITAMIN WITH MINERALS) TABS tablet Take 1 tablet by mouth daily. One A Day   Yes [provider]  niacin 500 MG CR capsule  Take 500 mg by mouth daily.   Yes [provider]  Omega-3 Fatty Acids (FISH OIL) 1200 MG CAPS Take 1,200 mg by mouth 2 (two) times daily.   Yes [provider]  silver sulfADIAZINE (SILVADENE) 1 % cream Apply 1 application topically daily. Patient not taking: Reported on 04/18/2017 11/04/16   Landis Martins, DPM     Allergies:   No Known Allergies  Social History:   Social History    Socioeconomic History  . Marital status: Married    Spouse name: Not on file  . Number of children: Not on file  . Years of education: Not on file  . Highest education level: Not on file  Social Needs  . Financial resource strain: Not on file  . Food insecurity - worry: Not on file  . Food insecurity - inability: Not on file  . Transportation needs - medical: Not on file  . Transportation needs - non-medical: Not on file  Occupational History  . Not on file  Tobacco Use  . Smoking status: Never Smoker  . Smokeless tobacco: Never Used  Substance and Sexual Activity  . Alcohol use: No  . Drug use: No  . Sexual activity: Not on file  Other Topics Concern  . Not on file  Social History Narrative  . Not on file    Family History:   The patient does not have a family history of premature cardiac illness, sudden cardiac death or atrial fibrillation that she is aware of.  ROS:  Please see the history of present illness.  All other ROS reviewed and negative.     Physical Exam/Data:   Vitals:   04/18/17 1503 04/18/17 1505  BP:  (!) 143/74  Pulse:  (!) 114  Resp:  17  Temp:  98.1 F (36.7 C)  TempSrc:  Oral  SpO2: 99% 96%   No intake or output data in the 24 hours ending 04/18/17 1622 There were no vitals filed for this visit. There is no height or weight on file to calculate BMI.  General:  Well nourished, well developed, in no acute distress HEENT: normal Lymph: no adenopathy Neck: no JVD Endocrine:  No thryomegaly Vascular: No carotid bruits; FA pulses 2+ bilaterally without bruits  Cardiac:  normal S1, S2; no murmur irregular rhythm Lungs:  clear to auscultation bilaterally, no wheezing, rhonchi or rales  Abd: soft, nontender, no hepatomegaly  Ext: no edema Musculoskeletal:  No deformities, BUE and BLE strength normal and equal Skin: warm and dry  Neuro:  CNs 2-12 intact, no focal abnormalities noted Psych:  Normal affect    EKG:  The ECG that was done   was personally reviewed and demonstrates atrial fibrillation with rapid ventricular response  Relevant CV Studies:   Laboratory Data:  ChemistryNo results for input(s): NA, K, CL, CO2, GLUCOSE, BUN, CREATININE, CALCIUM, GFRNONAA, GFRAA, ANIONGAP in the last 168 hours.  No results for input(s): PROT, ALBUMIN, AST, ALT, ALKPHOS, BILITOT in the last 168 hours. Hematology Recent Labs  Lab 04/18/17 1550  WBC 8.2  RBC 3.74*  HGB 11.8*  HCT 36.2  MCV 96.8  MCH 31.6  MCHC 32.6  RDW 14.0  PLT 194   Cardiac EnzymesNo results for input(s): TROPONINI in the last 168 hours. No results for input(s): TROPIPOC in the last 168 hours.  BNPNo results for input(s): BNP, PROBNP in the last 168 hours.  DDimer No results for input(s): DDIMER in the last 168 hours.  Radiology/Studies:  No results  found.  Assessment and Plan:   1. AFib: Symptomatic due to rapid ventricular response. Has been started on intravenous diltiazem in the emergency room. Will increase her home dose of metoprolol to 50 mg twice daily and try to use only one agent for rate control if possible. Note that there was no meaningful bradycardia on her Holter monitor from April 2017. CHADSVasc 4 (age 31, gender, diabetes, hypertension). As far as I can tell she needs to be on a full 5 mg twice daily dose of Eliquis. Although she is elderly, she does not have small body habitus (she was 180 pounds in (and her creatinine was virtually normal when last checked in August. 2. HTN: Continue metoprolol for both blood pressure and ventricular rate control 3. DM: Do not have information about degree of control. 4. HLP: On fibrate and not have a recent lipid profile.  If with rate control she becomes asymptomatic would recommend reevaluating indication for cardioversion after at least 3 weeks of full dose anticoagulation. If she remains symptomatic even with good ventricular rate control, consider TEE guided cardioversion during this  admission.  Severity of Illness: The appropriate patient status for this patient is INPATIENT. Inpatient status is judged to be reasonable and necessary in order to provide the required intensity of service to ensure the patient's safety. The patient's presenting symptoms, physical exam findings, and initial radiographic and laboratory data in the context of their chronic comorbidities is felt to place them at high risk for further clinical deterioration. Furthermore, it is not anticipated that the patient will be medically stable for discharge from the hospital within 2 midnights of admission. The following factors support the patient status of inpatient.   " The patient's presenting symptoms include near-syncope. " The worrisome physical exam findings include tachycardia, atrial fibrillation. " The initial radiographic and laboratory data are worrisome because of . " The chronic co-morbidities include diabetes mellitus, hypertension, advanced age.   * I certify that at the point of admission it is my clinical judgment that the patient will require inpatient hospital care spanning beyond 2 midnights from the point of admission due to high intensity of service, high risk for further deterioration and high frequency of surveillance required.*    For questions or updates, please contact [place] Please consult [email or web address] for contact info under Cardiology/STEMI.    Signed, Sanda Klein, MD  04/18/2017 4:22 PM

## 2017-04-18 NOTE — ED Provider Notes (Signed)
[facility] EMERGENCY DEPARTMENT Provider Note  CSN: [id number] Arrival date & time: 04/18/17 1456  Chief Complaint(s) Atrial Fibrillation  HPI Maureen Stout is a 83 y.o. female with a history of atrial fibrillation on metoprolol and Eliquis presents to the emergency department with sudden onset of fatigue and lightheadedness 2 hours prior to arrival.  Patient reports that she was at [employer] when all of a sudden she felt like she was going to pass out.  EMS was called and noted that the patient was in A. fib RVR with a rate of 150.  She was given 6 mg of adenosine which revealed underlying rhythm confirming atrial fibrillation.  Patient reports that since she has been in the stretcher, her symptoms have improved.  Currently she denies any sensation of tachycardia or palpitations.  No chest pain or shortness of breath.  Patient does report recent history of urinary tract infection that is currently being treated.  Denies any other infectious symptoms.  Denies any recent nausea/vomiting or diarrhea.  Denies any other alleviating or aggravating factors.  Denies any other physical complaints.  HPI  Past Medical History Past Medical History:  Diagnosis Date  . A-fib ([place])   . Bladder problem   . Diabetes mellitus without complication ([place])    There are no active problems to display for this patient.  Home Medication(s) Prior to Admission medications   Medication Sig Start Date End Date Taking? Authorizing Provider  allopurinol (ZYLOPRIM) 300 MG tablet  11/05/14   [provider]  amoxicillin-clavulanate (AUGMENTIN) 875-125 MG per tablet Take 1 tablet by mouth 2 (two) times daily. Patient not taking: Reported on 01/13/2017 03/23/13   Harriet Masson, DPM  aspirin 81 MG tablet Take 81 mg by mouth daily.    [provider]  cephALEXin (KEFLEX) 500 MG capsule Take 1 capsule (500 mg total) by mouth 3 (three) times daily. Patient not taking: Reported on  01/13/2017 02/26/14   Harriet Masson, DPM  ciprofloxacin (CIPRO) 500 MG tablet Take 1 tablet (500 mg total) by mouth 2 (two) times daily. Patient not taking: Reported on 01/13/2017 03/23/13   Harriet Masson, DPM  COLCRYS 0.6 MG tablet  10/01/14   [provider]  gemfibrozil (LOPID) 600 MG tablet  02/02/13   [provider]  lisinopril (PRINIVIL,ZESTRIL) 20 MG tablet  03/22/13   [provider]  Multiple Vitamin (MULTIVITAMIN) tablet Take 1 tablet by mouth daily.    [provider]  nabumetone (RELAFEN) 750 MG tablet  02/02/13   [provider]  niacin 250 MG tablet Take 250 mg by mouth at bedtime.    [provider]  predniSONE (DELTASONE) 20 MG tablet  10/30/14   [provider]  silver sulfADIAZINE (SILVADENE) 1 % cream Apply 1 application topically daily. 11/04/16   Landis Martins, DPM  simvastatin (ZOCOR) 80 MG tablet  03/06/13   [provider]  Past Surgical History Past Surgical History:  Procedure Laterality Date  . TOE SURGERY     Family History No family history on file.  Social History Social History   Tobacco Use  . Smoking status: Never Smoker  . Smokeless tobacco: Never Used  Substance Use Topics  . Alcohol use: No  . Drug use: No   Allergies Patient has no known allergies.  Review of Systems Review of Systems All other systems are reviewed and are negative for acute change except as noted in the HPI  Physical Exam Vital Signs  I have reviewed the triage vital signs BP (!) 143/74 (BP Location: Right Arm)   Pulse (!) 114   Temp 98.1 F (36.7 C) (Oral)   Resp 17   SpO2 96%   Physical Exam  Constitutional: She is oriented to person, place, and time. She appears well-developed and well-nourished. No distress.  HENT:  Head: Normocephalic and atraumatic.  Nose:  Nose normal.  Eyes: Conjunctivae and EOM are normal. Pupils are equal, round, and reactive to light. Right eye exhibits no discharge. Left eye exhibits no discharge. No scleral icterus.  Neck: Normal range of motion. Neck supple.  Cardiovascular: An irregularly irregular rhythm present. Tachycardia present. Exam reveals no gallop and no friction rub.  No murmur heard. Pulmonary/Chest: Effort normal and breath sounds normal. No stridor. No respiratory distress. She has no rales.  Abdominal: Soft. She exhibits no distension. There is no tenderness.  Musculoskeletal: She exhibits no edema or tenderness.  Neurological: She is alert and oriented to person, place, and time.  Skin: Skin is warm and dry. No rash noted. She is not diaphoretic. No erythema.  Psychiatric: She has a normal mood and affect.  Vitals reviewed.   ED Results and Treatments Labs (all labs ordered are listed, but only abnormal results are displayed) Labs Reviewed  CBC  COMPREHENSIVE METABOLIC PANEL  MAGNESIUM  URINALYSIS, ROUTINE W REFLEX MICROSCOPIC                                                                                                                         EKG  EKG Interpretation  Date/Time:    Ventricular Rate:    PR Interval:    QRS Duration:   QT Interval:    QTC Calculation:   R Axis:     Text Interpretation:        Radiology No results found. Pertinent labs & imaging results that were available during my care of the patient were reviewed by me and considered in my medical decision making (see chart for details).  Medications Ordered in ED Medications  diltiazem (CARDIZEM) 1 mg/mL load via infusion 15 mg (not administered)    And  diltiazem (CARDIZEM) 100 mg in dextrose 5 % 100 mL (1 mg/mL) infusion (not administered)  Procedures Procedures CRITICAL  CARE Performed by: Grayce Sessions Cardama Total critical care time: 40 minutes Critical care time was exclusive of separately billable procedures and treating other patients. Critical care was necessary to treat or prevent imminent or life-threatening deterioration. Critical care was time spent personally by me on the following activities: development of treatment plan with patient and/or surrogate as well as nursing, discussions with consultants, evaluation of patient's response to treatment, examination of patient, obtaining history from patient or surrogate, ordering and performing treatments and interventions, ordering and review of laboratory studies, ordering and review of radiographic studies, pulse oximetry and re-evaluation of patient's condition.   (including critical care time)  Medical Decision Making / ED Course I have reviewed the nursing notes for this encounter and the patient's prior records (if available in EHR or on provided paperwork).    Patient is in A. fib RVR at a rate of 130s.  Currently she is asymptomatic.  EKG confirmed A. fib RVR without acute ischemic changes.  Attempt to review the patient's past medical history was limited as most of her care has been done in an outside health system.  Patient was given a diltiazem bolus and started on Dilts drip.  Will discuss case with cardiology for admission.  Final Clinical Impression(s) / ED Diagnoses Final diagnoses:  Tachycardia  Atrial fibrillation with RVR ([place])      This chart was dictated using voice recognition software.  Despite best efforts to proofread,  errors can occur which can change the documentation meaning.   Fatima Blank, MD 04/18/17 [phone]

## 2017-04-18 NOTE — ED Notes (Signed)
Patient transported to X-ray 

## 2017-04-19 ENCOUNTER — Other Ambulatory Visit: Payer: Self-pay | Admitting: Physician Assistant

## 2017-04-19 ENCOUNTER — Encounter (HOSPITAL_COMMUNITY): Payer: Self-pay | Admitting: Cardiology

## 2017-04-19 DIAGNOSIS — I480 Paroxysmal atrial fibrillation: Secondary | ICD-10-CM | POA: Diagnosis not present

## 2017-04-19 DIAGNOSIS — R42 Dizziness and giddiness: Secondary | ICD-10-CM

## 2017-04-19 DIAGNOSIS — N179 Acute kidney failure, unspecified: Secondary | ICD-10-CM | POA: Diagnosis not present

## 2017-04-19 DIAGNOSIS — E785 Hyperlipidemia, unspecified: Secondary | ICD-10-CM | POA: Diagnosis not present

## 2017-04-19 DIAGNOSIS — R000 Tachycardia, unspecified: Secondary | ICD-10-CM | POA: Diagnosis not present

## 2017-04-19 DIAGNOSIS — I4891 Unspecified atrial fibrillation: Secondary | ICD-10-CM | POA: Diagnosis not present

## 2017-04-19 DIAGNOSIS — Z7901 Long term (current) use of anticoagulants: Secondary | ICD-10-CM | POA: Diagnosis not present

## 2017-04-19 DIAGNOSIS — E119 Type 2 diabetes mellitus without complications: Secondary | ICD-10-CM | POA: Diagnosis not present

## 2017-04-19 DIAGNOSIS — I10 Essential (primary) hypertension: Secondary | ICD-10-CM | POA: Diagnosis not present

## 2017-04-19 DIAGNOSIS — L8990 Pressure ulcer of unspecified site, unspecified stage: Secondary | ICD-10-CM

## 2017-04-19 LAB — HEMOGLOBIN A1C
Hgb A1c MFr Bld: 5.7 % — ABNORMAL HIGH (ref 4.8–5.6)
MEAN PLASMA GLUCOSE: 117 mg/dL

## 2017-04-19 LAB — BASIC METABOLIC PANEL
Anion gap: 8 (ref 5–15)
BUN: 25 mg/dL — AB (ref 6–20)
CALCIUM: 9.7 mg/dL (ref 8.9–10.3)
CO2: 25 mmol/L (ref 22–32)
CREATININE: 1.13 mg/dL — AB (ref 0.44–1.00)
Chloride: 107 mmol/L (ref 101–111)
GFR, EST AFRICAN AMERICAN: 51 mL/min — AB (ref >60)
GFR, EST NON AFRICAN AMERICAN: 44 mL/min — AB (ref >60)
Glucose, Bld: 107 mg/dL — ABNORMAL HIGH (ref 65–99)
Potassium: 3.8 mmol/L (ref 3.5–5.1)
Sodium: 140 mmol/L (ref 135–145)

## 2017-04-19 LAB — GLUCOSE, CAPILLARY
GLUCOSE-CAPILLARY: 136 mg/dL — AB (ref 65–99)
GLUCOSE-CAPILLARY: 102 mg/dL — AB (ref 65–99)
Glucose-Capillary: 102 mg/dL — ABNORMAL HIGH (ref 65–99)

## 2017-04-19 LAB — TROPONIN I: TROPONIN I: 0.07 ng/mL — AB (ref <0.03)

## 2017-04-19 LAB — LIPID PANEL
CHOLESTEROL: 127 mg/dL (ref 0–200)
HDL: 39 mg/dL — ABNORMAL LOW (ref >40)
LDL Cholesterol: 70 mg/dL (ref 0–99)
Total CHOL/HDL Ratio: 3.3 RATIO
Triglycerides: 91 mg/dL (ref <150)
VLDL: 18 mg/dL (ref 0–40)

## 2017-04-19 MED ORDER — APIXABAN 5 MG PO TABS: 5.0000 mg | ORAL_TABLET | Freq: Two times a day (BID) | ORAL | 2 refills | Status: DC

## 2017-04-19 MED ORDER — METOPROLOL TARTRATE 50 MG PO TABS: 50.0000 mg | ORAL_TABLET | Freq: Two times a day (BID) | ORAL | 2 refills | Status: DC

## 2017-04-19 NOTE — Progress Notes (Addendum)
Pt with AFib/RVR transferred from ED on cardiezem @7 .67ml/hr. Converted to NSR at 2254, HR in 70's, drip weaned to 46ml/hr. EKG obtained for rhythm change, Sinus brady HR 58, BP 105/65. Dr Kenton Kingfisher in house cardiologist on call paged and made aware. Received verbal order to stop diltiazem drip. Carried out. Lory Galan Ladora Daniel, BSN, RN.

## 2017-04-19 NOTE — Progress Notes (Signed)
Reviewed discharge paperwork with patient and no further questions. IV removed successfully. Pt informed about upcoming appointments. Pt discharged to home.

## 2017-04-19 NOTE — H&P (Signed)
Cardiology Admission History and Physical:   Patient ID: Maureen Stout; MRN: [id number]; DOB: 06/27/1931   Admission date: 04/18/2017  Primary Care Provider: Nicoletta Dress, MD Primary Cardiologist: [facility] Primary Electrophysiologist:  n/a  Chief Complaint:  Weakness, near-syncope  Patient Profile:   Maureen Stout is a 83 y.o. female with a history of paroxysmal atrial fibrillation presenting today for presyncope associated with atrial fibrillation rapid ventricular response.  History of Present Illness:   Maureen Stout is usually in a good state of health and a very active 83 year old lady. She was shopping at [employer] today when she had sudden onset of severe weakness and dizziness with a minimal subjective palpitations. She was found to be in atrial fibrillation with rapid ventricular response around 150 bpm and feels better with in the emergency room with rate control medication. She still feels dizzy if he tries to sit up and denies any problems with dyspnea, chest pain, palpitations or any recent intercurrent illness.  She has only been taking liquids 2.5 mg once daily. Reportedly she was told to do so by her primary care physician, but the most recent instructions in the computer showed this to be a twice daily prescription. I'm not sure why she was prescribed a lower dose of L Chris. She is elderly but does not have a low body mass. Her creatinine is still pending, but she denies problems with kidney disease. Labs from August 2018 show creatinine of 1.1 in [facility].  A transthoracic echo is noted to be performed in April 2017, but the report and the images cannot be retrieved. A 24 Holter monitor performed in 2017 showed a burden of atrial fibrillation of roughly 3% with episodes of RVR. When last seen by Dr. Bettina Gavia in April 2017 multi was initiated but she is no longer taking this medication. I don't think she has followed up with him since.  Additional medical  problems include hyperlipidemia and hypertension and type 2 diabetes mellitus on oral antidiabetics. She weighs approximately 180 pounds, borderline obese. She denies a history of bleeding problems. She has previously undergone hysterectomy.       Past Medical History:  Diagnosis Date  . A-fib ([place])   . Bladder problem   . Diabetes mellitus without complication [facility])          Past Surgical History:  Procedure Laterality Date  . TOE SURGERY     Hysterectomy  Medications Prior to Admission:        Prior to Admission medications   Medication Sig Start Date End Date Taking? Authorizing Provider  allopurinol (ZYLOPRIM) 300 MG tablet Take 300 mg by mouth at bedtime.  11/05/14  Yes [provider]  Alogliptin Benzoate (NESINA) 25 MG TABS Take 25 mg by mouth at bedtime.   Yes [provider]  apixaban (ELIQUIS) 5 MG TABS tablet Take 2.5 mg by mouth at bedtime.   Yes [provider]  atorvastatin (LIPITOR) 10 MG tablet Take 10 mg by mouth at bedtime. 03/18/17  Yes [provider]  Calcium Carbonate-Vitamin D (CALCIUM-D PO) Take 1 tablet by mouth 2 (two) times daily.   Yes [provider]  gemfibrozil (LOPID) 600 MG tablet Take 600 mg by mouth 2 (two) times daily.  02/02/13  Yes [provider]  metoprolol tartrate (LOPRESSOR) 25 MG tablet Take 25 mg by mouth 2 (two) times daily. 04/16/17  Yes [provider]  Multiple Vitamin (MULTIVITAMIN WITH MINERALS) TABS tablet Take 1 tablet by mouth  daily. One A Day   Yes [provider]  niacin 500 MG CR capsule Take 500 mg by mouth daily.   Yes [provider]  Omega-3 Fatty Acids (FISH OIL) 1200 MG CAPS Take 1,200 mg by mouth 2 (two) times daily.   Yes [provider]  silver sulfADIAZINE (SILVADENE) 1 % cream Apply 1 application topically daily. Patient not taking: Reported on 04/18/2017 11/04/16   Landis Martins, DPM     Allergies:    No Known Allergies  Social History:   Social History        Socioeconomic History  . Marital status: Married    Spouse name: Not on file  . Number of children: Not on file  . Years of education: Not on file  . Highest education level: Not on file  Social Needs  . Financial resource strain: Not on file  . Food insecurity - worry: Not on file  . Food insecurity - inability: Not on file  . Transportation needs - medical: Not on file  . Transportation needs - non-medical: Not on file  Occupational History  . Not on file  Tobacco Use  . Smoking status: Never Smoker  . Smokeless tobacco: Never Used  Substance and Sexual Activity  . Alcohol use: No  . Drug use: No  . Sexual activity: Not on file  Other Topics Concern  . Not on file  Social History Narrative  . Not on file    Family History:   The patient does not have a family history of premature cardiac illness, sudden cardiac death or atrial fibrillation that she is aware of.  ROS:  Please see the history of present illness.  All other ROS reviewed and negative.     Physical Exam/Data:       Vitals:   04/18/17 1503 04/18/17 1505  BP:  (!) 143/74  Pulse:  (!) 114  Resp:  17  Temp:  98.1 F (36.7 C)  TempSrc:  Oral  SpO2: 99% 96%   No intake or output data in the 24 hours ending 04/18/17 1622 There were no vitals filed for this visit. There is no height or weight on file to calculate BMI.  General:  Well nourished, well developed, in no acute distress HEENT: normal Lymph: no adenopathy Neck: no JVD Endocrine:  No thryomegaly Vascular: No carotid bruits; FA pulses 2+ bilaterally without bruits  Cardiac:  normal S1, S2; no murmur irregular rhythm Lungs:  clear to auscultation bilaterally, no wheezing, rhonchi or rales  Abd: soft, nontender, no hepatomegaly  Ext: no edema Musculoskeletal:  No deformities, BUE and BLE strength normal and equal Skin: warm and dry  Neuro:  CNs 2-12 intact, no focal  abnormalities noted Psych:  Normal affect    EKG:  The ECG that was done  was personally reviewed and demonstrates atrial fibrillation with rapid ventricular response  Relevant CV Studies:   Laboratory Data:  Chemistry Last[facility]  No results for input(s): NA, K, CL, CO2, GLUCOSE, BUN, CREATININE, CALCIUM, GFRNONAA, GFRAA, ANIONGAP in the last 168 hours.    Last[facility]  No results for input(s): PROT, ALBUMIN, AST, ALT, ALKPHOS, BILITOT in the last 168 hours.   Hematology LastLabs     Recent Labs  Lab 04/18/17 1550  WBC 8.2  RBC 3.74*  HGB 11.8*  HCT 36.2  MCV 96.8  MCH 31.6  MCHC 32.6  RDW 14.0  PLT 194     Cardiac Enzymes Last[facility]  No results for input(s): TROPONINI  in the last 168 hours.    LastLabs  No results for input(s): TROPIPOC in the last 168 hours.    BNP LastL[facility]  No results for input(s): BNP, PROBNP in the last 168 hours.    DDimer  Elie Confer  No results for input(s): DDIMER in the last 168 hours.    Radiology/Studies:  No results found.  Assessment and Plan:   1. AFib: Symptomatic due to rapid ventricular response. Has been started on intravenous diltiazem in the emergency room. Will increase her home dose of metoprolol to 50 mg twice daily and try to use only one agent for rate control if possible. Note that there was no meaningful bradycardia on her Holter monitor from April 2017. CHADSVasc 4 (age 29, gender, diabetes, hypertension). As far as I can tell she needs to be on a full 5 mg twice daily dose of Eliquis. Although she is elderly, she does not have small body habitus (she was 180 pounds in (and her creatinine was virtually normal when last checked in August. 2. HTN: Continue metoprolol for both blood pressure and ventricular rate control 3. DM: Do not have information about degree of control. 4. HLP: On fibrate and not have a recent lipid profile.  If with rate control she becomes asymptomatic would recommend  reevaluating indication for cardioversion after at least 3 weeks of full dose anticoagulation. If she remains symptomatic even with good ventricular rate control, consider TEE guided cardioversion during this admission.  Severity of Illness: The appropriate patient status for this patient is INPATIENT. Inpatient status is judged to be reasonable and necessary in order to provide the required intensity of service to ensure the patient's safety. The patient's presenting symptoms, physical exam findings, and initial radiographic and laboratory data in the context of their chronic comorbidities is felt to place them at high risk for further clinical deterioration. Furthermore, it is not anticipated that the patient will be medically stable for discharge from the hospital within 2 midnights of admission. The following factors support the patient status of inpatient.   "           The patient's presenting symptoms include near-syncope. "           The worrisome physical exam findings include tachycardia, atrial fibrillation. "           The initial radiographic and laboratory data are worrisome because of . "           The chronic co-morbidities include diabetes mellitus, hypertension, advanced age.   * I certify that at the point of admission it is my clinical judgment that the patient will require inpatient hospital care spanning beyond 2 midnights from the point of admission due to high intensity of service, high risk for further deterioration and high frequency of surveillance required.*    For questions or updates, please contact [place] Please consult [email or web address] for contact info under Cardiology/STEMI.    Signed, Sanda Klein, MD  04/18/2017 4:22 PM        Revision History

## 2017-04-19 NOTE — Discharge Summary (Signed)
Discharge Summary    Patient ID: Maureen Stout,  MRN: [id number], DOB/AGE: 1931/12/10 83 y.o.  Admit date: 04/18/2017 Discharge date: 04/19/2017  Primary Care Provider: Nicoletta Dress Primary Cardiologist: Dr. Bettina Gavia  Discharge Diagnoses    Active Problems:   Atrial fibrillation with rapid ventricular response (HCC)   Pressure injury of skin  Allergies No Known Allergies  Diagnostic Studies/Procedures    None. Echocardiogram as an outpatient _____________   History of Present Illness     Maureen Stout is a 83 y.o. female with a history of paroxysmal atrial fibrillation presenting today for presyncope associated with atrial fibrillation rapid ventricular response.   Hospital Course     Consultants: None   Pt was admitted 04/18/2017 after feeling dizzy and lightheaded while shopping. Upon arrival to the ED, pt was found to be in AFib RVR. She was placed briefly placed on a Diltiazem drip and auto-converted to NSR.    1. Afib: -CHA2DSsVASc score=5 (HTN, Age (2), DM, Sex) -TSH WNL (2.8) -Eliquis has been resumed at 5mg  PO BID. It should be noted that the patient reports taking this medication once per day prior to admission. - Echocardiogram has been ordered and will be completed as an outpatient to   assess for structural disease -It is suspected that her intermittent dizziness is relate to her PAF or conversion to SR -Event monitor will be obtained as an outpatient to further assess.   -Minimal elevation in troponin levels secondary to rapid HR, no CP: 12/3-0.07, 12/3-0.06, 12/2-0.03   2. HTN: -Continue metoprolol for BP control -BP today 145/61  3. DM: - Stable, HB A1C= 5.7; BS today-107  -Restart Alogliptin Benzonate (Nesina) 25mg  PO QD- home oral agent.  4. HLD: -Stable, Lipid panel WNL, HDL slightly low at 39  -Continue Lipitor, gemfibrozil  04/19/2017 Pt seen an examined per Dr.Khaliel Morey. Pt is stable and is ready for discharge.   _____________  Discharge Vitals Blood pressure 133/66, pulse (!) 56, temperature 97.9 F (36.6 C), temperature source Oral, resp. rate 16, height 5\' 6"  (1.676 m), weight 183 lb 11.2 oz (83.3 kg), SpO2 97 %.  Filed Weights   04/18/17 1626 04/18/17 2031 04/19/17 0444  Weight: 180 lb (81.6 kg) 184 lb 14.4 oz (83.9 kg) 183 lb 11.2 oz (83.3 kg)    Labs & Radiologic Studies    CBC Recent Labs    04/18/17 1550  WBC 8.2  HGB 11.8*  HCT 36.2  MCV 96.8  PLT 845   Basic Metabolic Panel Recent Labs    04/18/17 1550 04/19/17 0457  NA 138 140  K 3.8 3.8  CL 104 107  CO2 25 25  GLUCOSE 161* 107*  BUN 28* 25*  CREATININE 1.40* 1.13*  CALCIUM 9.9 9.7  MG 1.4*  --    Liver Function Tests Recent Labs    04/18/17 1550  AST 25  ALT 13*  ALKPHOS 67  BILITOT 0.8  PROT 6.4*  ALBUMIN 3.8   No results for input(s): LIPASE, AMYLASE in the last 72 hours. Cardiac Enzymes Recent Labs    04/18/17 1704 04/18/17 2224 04/19/17 0457  TROPONINI 0.03* 0.06* 0.07*   BNP Invalid input(s): POCBNP D-Dimer No results for input(s): DDIMER in the last 72 hours. Hemoglobin A1C Recent Labs    04/18/17 1704  HGBA1C 5.7*   Fasting Lipid Panel Recent Labs    04/19/17 0457  CHOL 127  HDL 39*  LDLCALC 70  TRIG 91  CHOLHDL 3.3  Thyroid Function Tests Recent Labs    04/18/17 1704  TSH 2.898   _____________  Dg Chest 2 View  Result Date: 04/18/2017 CLINICAL DATA:  Acute tachycardia with weakness and dizziness today. EXAM: CHEST  2 VIEW COMPARISON:  None. FINDINGS: Cardiomegaly identified. Mild bibasilar atelectasis noted. There is no evidence of focal airspace disease, pulmonary edema, suspicious pulmonary nodule/mass, pleural effusion, or pneumothorax. No acute bony abnormalities are identified. IMPRESSION: Cardiomegaly and mild bibasilar atelectasis. Electronically Signed   By: Margarette Canada M.D.   On: 04/18/2017 18:40   Disposition   Pt is being discharged home today in good  condition.  Follow-up Plans & Appointments     Follow-up Information    Sherran Needs, NP Follow up on 06/08/2017.   Specialties:  Nurse Practitioner, Cardiology Why:  [place] appointment will be with Roderic Palau on 06/08/2016 at 11:30am Contact information: [place] [address] [phone]        [place] [facility] Follow up on 04/29/2017.   Specialty:  Cardiology Why:  Please arrive at 12:45pm for an echocardiogram at 1pm and stay for the Event Monitor fitting.  Contact information: [address] [place] [phone]         Discharge Instructions    Call MD for:  persistant dizziness or light-headedness   Complete by:  As directed    Diet Carb Modified   Complete by:  As directed    Increase activity slowly   Complete by:  As directed       Discharge Medications   Allergies as of 04/19/2017   No Known Allergies     Medication List    STOP taking these medications   silver sulfADIAZINE 1 % cream Commonly known as:  SILVADENE     TAKE these medications   allopurinol 300 MG tablet Commonly known as:  ZYLOPRIM Take 300 mg by mouth at bedtime.   apixaban 5 MG Tabs tablet Commonly known as:  ELIQUIS Take 1 tablet (5 mg total) by mouth 2 (two) times daily. What changed:    how much to take  when to take this   atorvastatin 10 MG tablet Commonly known as:  LIPITOR Take 10 mg by mouth at bedtime.   CALCIUM-D PO Take 1 tablet by mouth 2 (two) times daily.   Fish Oil 1200 MG Caps Take 1,200 mg by mouth 2 (two) times daily.   gemfibrozil 600 MG tablet Commonly known as:  LOPID Take 600 mg by mouth 2 (two) times daily.   metoprolol tartrate 50 MG tablet Commonly known as:  LOPRESSOR Take 1 tablet (50 mg total) by mouth 2 (two) times daily. What changed:    medication strength  how much to take   multivitamin with minerals Tabs tablet Take 1 tablet by mouth daily. One A  Day   NESINA 25 MG Tabs Generic drug:  Alogliptin Benzoate Take 25 mg by mouth at bedtime.   niacin 500 MG CR capsule Take 500 mg by mouth daily.       Outstanding Labs/Studies   None  Duration of Discharge Encounter   Greater than 30 minutes including physician time.  Signed, Maureen Drown NP 04/19/2017, 4:41 PM   Personally seen and examined agree with above  89 year old with paroxysmal atrial fibrillation.  Intermittent dizziness.  Exam: Bradycardic regular heart rate 53 bpm on telemetry personally reviewed, lungs are clear, abdomen soft, no edema, alert.  She continues to  drive she states.  She lives alone and [place].  Paroxysmal atrial fibrillation -Auto converted.  Continue with anticoagulation.  Eliquis.  If she has return of atrial fibrillation, one could consider antiarrhythmic agent however at rest, she is demonstrating mild bradycardia with heart rates of 53 bpm. -It might be helpful to have her follow-up once in our atrial fibrillation clinic for further advice.  She can obtain her echocardiogram as an outpatient.  Hypertension with diabetes and hyperlipidemia -Very well controlled.  Hemoglobin A1c 5.7.  I am comfortable with her being discharged.  She may follow up with Dr. Bettina Gavia.  Candee Furbish, MD

## 2017-04-19 NOTE — Progress Notes (Signed)
Progress Note  Patient Name: Maureen Stout Date of Encounter: 04/19/2017  Primary Cardiologist: Dr. Bettina Gavia  Subjective   Very pleasant pt, doing well today. Denies CP, SOB, palpitations, or dizziness.   In reviewing Maureen Stout's [facility] health records, it appears that on an office visit on 08/2015 with Dr. Bettina Gavia, she was prescribed dronedrone (Multaq) 400mg  PO BID. She does not recall taking this medication or having completed a TTE set up for her during this time.    Inpatient Medications    Scheduled Meds: . allopurinol  300 mg Oral QHS  . apixaban  5 mg Oral BID  . atorvastatin  10 mg Oral QHS  . gemfibrozil  600 mg Oral BID  . metoprolol tartrate  50 mg Oral BID   Continuous Infusions: . diltiazem (CARDIZEM) infusion Stopped (04/19/17 0055)   PRN Meds: acetaminophen, ondansetron (ZOFRAN) IV   Vital Signs    Vitals:   04/18/17 2031 04/19/17 0038 04/19/17 0444 04/19/17 0950  BP:  105/65 112/68 (!) 145/61  Pulse: (!) 115 (!) 50 84   Resp: 18 15 16    Temp: 98.7 F (37.1 C)  97.8 F (36.6 C)   TempSrc: Oral  Oral   SpO2: 91% 92% 97%   Weight: 184 lb 14.4 oz (83.9 kg)  183 lb 11.2 oz (83.3 kg)   Height: 5\' 6"  (1.676 m)       Intake/Output Summary (Last 24 hours) at 04/19/2017 1043 Last data filed at 04/19/2017 0700 Gross per 24 hour  Intake 59.71 ml  Output 1050 ml  Net -990.29 ml   Filed Weights   04/18/17 1626 04/18/17 2031 04/19/17 0444  Weight: 180 lb (81.6 kg) 184 lb 14.4 oz (83.9 kg) 183 lb 11.2 oz (83.3 kg)    Physical Exam   General: Well developed, well nourished, NAD Skin: Warm, dry, intact  Head: Normocephalic, atraumatic, clear, moist mucus membranes. Neck: Negative for carotid bruits. No JVD Lungs:Clear to ausculation bilaterally. No wheezes, rales, or rhonchi. Breathing is unlabored. Cardiovascular: RRR with S1 S2. No murmurs, rubs, or gallops. Abdomen: Soft, non-tender, non-distended with normoactive bowel sounds. No obvious abdominal  masses. MSK: Strength and tone appear normal for age. 5/5 in all extremities Extremities: No edema. No clubbing or cyanosis. DP/PT pulses 2+ bilaterally Neuro: Alert and oriented. No focal deficits. No facial asymmetry. MAE spontaneously. Psych: Responds to questions appropriately with normal affect.    Labs    Chemistry Recent Labs  Lab 04/18/17 1550 04/19/17 0457  NA 138 140  K 3.8 3.8  CL 104 107  CO2 25 25  GLUCOSE 161* 107*  BUN 28* 25*  CREATININE 1.40* 1.13*  CALCIUM 9.9 9.7  PROT 6.4*  --   ALBUMIN 3.8  --   AST 25  --   ALT 13*  --   ALKPHOS 67  --   BILITOT 0.8  --   GFRNONAA 34* 44*  GFRAA 39* 51*  ANIONGAP 9 8     Hematology Recent Labs  Lab 04/18/17 1550  WBC 8.2  RBC 3.74*  HGB 11.8*  HCT 36.2  MCV 96.8  MCH 31.6  MCHC 32.6  RDW 14.0  PLT 194    Cardiac Enzymes Recent Labs  Lab 04/18/17 1704 04/18/17 2224 04/19/17 0457  TROPONINI 0.03* 0.06* 0.07*   No results for input(s): TROPIPOC in the last 168 hours.   BNPNo results for input(s): BNP, PROBNP in the last 168 hours.   DDimer No results for input(s):  DDIMER in the last 168 hours.   Radiology    Dg Chest 2 View  Result Date: 04/18/2017 CLINICAL DATA:  Acute tachycardia with weakness and dizziness today. EXAM: CHEST  2 VIEW COMPARISON:  None. FINDINGS: Cardiomegaly identified. Mild bibasilar atelectasis noted. There is no evidence of focal airspace disease, pulmonary edema, suspicious pulmonary nodule/mass, pleural effusion, or pneumothorax. No acute bony abnormalities are identified. IMPRESSION: Cardiomegaly and mild bibasilar atelectasis. Electronically Signed   By: Margarette Canada M.D.   On: 04/18/2017 18:40   Telemetry    04/19/17 NSR. Pt spontaneously converted on her own - Personally Reviewed  ECG    04/19/17 NSR/SB - Personally Reviewed  Cardiac Studies   Unable to obtain records from [facility] health [facility] in Care everywhere  Patient Profile     83 y.o. female  with a history  of paroxysmal atrial fibrillation, HTN, HLD, and DMII presents today for presyncope associated with atrial fibrillation rapid ventricular response.   Assessment & Plan    1. Afib: -CHA2DSsVASc score=5 (HTN, Age (2), DM, Sex) -TSH WNL (2.8) -Eliguis has been resumed at 5mg  PO BID. It should be noted that the patient reports taking once per day.  -Will order Echo today to assess for structural disease -I suspect that her intermittent dizziness is relate to her PAF or conversion to SR -Consider event monitor versus loop recorder to further assess -Trending troponin levels: 12/3-0.07, 12/3-0.06, 12/2-0.03  2. HTN: -Continue metoprolol for BP and ventricular rate control  -BP today 145/61  3. DM: -HB A1C= 5.7; BS today-107  -Consider restarting Alogliptin Benzonate (Nesina) 25mg  PO QD- home oral agent.  4. HLD: -Lipid panel WNL, HDL slightly low at 39  -Continue Lipitor, gemfibrozil  Signed, Kathyrn Drown, NP  04/19/2017, 10:43 AM   For questions or updates, please contact   Please consult [email or web address] for contact info under Cardiology/STEMI.  Personally seen and examined agree with above  83 year old with paroxysmal atrial fibrillation.  Intermittent dizziness.  Exam: Bradycardic regular heart rate 53 bpm on telemetry personally reviewed, lungs are clear, abdomen soft, no edema, alert.  She continues to drive she states.  She lives alone and Maureen Stout.  Paroxysmal atrial fibrillation -Auto converted.  Continue with anticoagulation.  Eliquis.  If she has return of atrial fibrillation, one could consider antiarrhythmic agent however at rest, she is demonstrating mild bradycardia with heart rates of 53 bpm. -It might be helpful to have her follow-up once in our atrial fibrillation clinic for further advice.  She can obtain her echocardiogram as an outpatient.  Hypertension with diabetes and hyperlipidemia -Very well controlled.  Hemoglobin A1c 5.7.  I am comfortable with her  being discharged.  She may follow up with Dr. Bettina Gavia.  Candee Furbish, MD

## 2017-04-20 LAB — URINE CULTURE

## 2017-04-21 DIAGNOSIS — R8290 Unspecified abnormal findings in urine: Secondary | ICD-10-CM | POA: Diagnosis not present

## 2017-04-21 DIAGNOSIS — N3941 Urge incontinence: Secondary | ICD-10-CM | POA: Diagnosis not present

## 2017-04-29 ENCOUNTER — Ambulatory Visit (INDEPENDENT_AMBULATORY_CARE_PROVIDER_SITE_OTHER): Payer: Medicare HMO

## 2017-04-29 ENCOUNTER — Other Ambulatory Visit: Payer: Self-pay

## 2017-04-29 ENCOUNTER — Ambulatory Visit (HOSPITAL_COMMUNITY): Payer: Medicare HMO | Attending: Cardiovascular Disease

## 2017-04-29 DIAGNOSIS — R42 Dizziness and giddiness: Secondary | ICD-10-CM

## 2017-04-29 DIAGNOSIS — E119 Type 2 diabetes mellitus without complications: Secondary | ICD-10-CM | POA: Insufficient documentation

## 2017-04-29 DIAGNOSIS — I480 Paroxysmal atrial fibrillation: Secondary | ICD-10-CM

## 2017-05-03 ENCOUNTER — Telehealth: Payer: Self-pay | Admitting: Internal Medicine

## 2017-05-03 NOTE — Telephone Encounter (Signed)
Received call from [employer] that patient has been has been atrial fibrillation with heart rates in the 150s and 140s.  Patient was called and she was getting ready to go to the bed.  She was asymptomatic.  It was asked from the service to fax the strips to the office.  Patient is already on Eliquis.

## 2017-05-04 ENCOUNTER — Telehealth: Payer: Self-pay | Admitting: *Deleted

## 2017-05-04 NOTE — Telephone Encounter (Signed)
Received critical notification from [employer] monitoring services showing pt in AFib RVR, rates 160 on 05/02/17 at 8:07 pm.  (pt does have PAF hx) Monitor report says pt was asymptomatic, home and getting ready for bed.  Have left message for pt to call back to discuss. (to confirm if she is/was asymptomatic & to schedule sooner AFib clinic appt for elevated HRs)

## 2017-05-05 ENCOUNTER — Encounter: Payer: Self-pay | Admitting: Sports Medicine

## 2017-05-05 ENCOUNTER — Ambulatory Visit (INDEPENDENT_AMBULATORY_CARE_PROVIDER_SITE_OTHER): Payer: Medicare HMO | Admitting: Sports Medicine

## 2017-05-05 DIAGNOSIS — Z89422 Acquired absence of other left toe(s): Secondary | ICD-10-CM

## 2017-05-05 DIAGNOSIS — L97521 Non-pressure chronic ulcer of other part of left foot limited to breakdown of skin: Secondary | ICD-10-CM

## 2017-05-05 DIAGNOSIS — M2010 Hallux valgus (acquired), unspecified foot: Secondary | ICD-10-CM

## 2017-05-05 DIAGNOSIS — L84 Corns and callosities: Secondary | ICD-10-CM

## 2017-05-05 DIAGNOSIS — E1140 Type 2 diabetes mellitus with diabetic neuropathy, unspecified: Secondary | ICD-10-CM

## 2017-05-05 DIAGNOSIS — M2040 Other hammer toe(s) (acquired), unspecified foot: Secondary | ICD-10-CM

## 2017-05-05 NOTE — Progress Notes (Signed)
Subjective: Maureen Stout is a 83 y.o. female patient with history of diabetes who returns to office today for evaluation of bilateral foot ulcerations. Reports that the right one has been dry the left one seems like it is opening up again and patient reports that when she was admitted to the hospital for her heart that they told her to stop using Silvadene cream on the left. Patient denies nausea, vomiting, fever chills or any other consitutional symptoms.  FBS this AM-not recorded  Patient Active Problem List   Diagnosis Date Noted  . Pressure injury of skin 04/19/2017  . Atrial fibrillation with rapid ventricular response ([place]) 04/18/2017   Current Outpatient Medications on File Prior to Visit  Medication Sig Dispense Refill  . allopurinol (ZYLOPRIM) 300 MG tablet Take 300 mg by mouth at bedtime.     . Alogliptin Benzoate (NESINA) 25 MG TABS Take 25 mg by mouth at bedtime.    Marland Kitchen apixaban (ELIQUIS) 5 MG TABS tablet Take 1 tablet (5 mg total) by mouth 2 (two) times daily. 60 tablet 2  . atorvastatin (LIPITOR) 10 MG tablet Take 10 mg by mouth at bedtime.    . Calcium Carbonate-Vitamin D (CALCIUM-D PO) Take 1 tablet by mouth 2 (two) times daily.    Marland Kitchen gemfibrozil (LOPID) 600 MG tablet Take 600 mg by mouth 2 (two) times daily.     . metoprolol tartrate (LOPRESSOR) 50 MG tablet Take 1 tablet (50 mg total) by mouth 2 (two) times daily. 60 tablet 2  . Multiple Vitamin (MULTIVITAMIN WITH MINERALS) TABS tablet Take 1 tablet by mouth daily. One A Day    . niacin 500 MG CR capsule Take 500 mg by mouth daily.    . Omega-3 Fatty Acids (FISH OIL) 1200 MG CAPS Take 1,200 mg by mouth 2 (two) times daily.     No current facility-administered medications on file prior to visit.    No Known Allergies  Recent Results (from the past 2160 hour(s))  CBC     Status: Abnormal   Collection Time: 04/18/17  3:50 PM  Result Value Ref Range   WBC 8.2 4.0 - 10.5 K/uL   RBC 3.74 (L) 3.87 - 5.11 MIL/uL   Hemoglobin  11.8 (L) 12.0 - 15.0 g/dL   HCT 36.2 36.0 - 46.0 %   MCV 96.8 78.0 - 100.0 fL   MCH 31.6 26.0 - 34.0 pg   MCHC 32.6 30.0 - 36.0 g/dL   RDW 14.0 11.5 - 15.5 %   Platelets 194 150 - 400 K/uL  Comprehensive metabolic panel     Status: Abnormal   Collection Time: 04/18/17  3:50 PM  Result Value Ref Range   Sodium 138 135 - 145 mmol/L   Potassium 3.8 3.5 - 5.1 mmol/L   Chloride 104 101 - 111 mmol/L   CO2 25 22 - 32 mmol/L   Glucose, Bld 161 (H) 65 - 99 mg/dL   BUN 28 (H) 6 - 20 mg/dL   Creatinine, Ser 1.40 (H) 0.44 - 1.00 mg/dL   Calcium 9.9 8.9 - 10.3 mg/dL   Total Protein 6.4 (L) 6.5 - 8.1 g/dL   Albumin 3.8 3.5 - 5.0 g/dL   AST 25 15 - 41 U/L   ALT 13 (L) 14 - 54 U/L   Alkaline Phosphatase 67 38 - 126 U/L   Total Bilirubin 0.8 0.3 - 1.2 mg/dL   GFR calc non Af Amer 34 (L) >60 mL/min   GFR calc Af Amer 39 (  L) >60 mL/min    Comment: (NOTE) The eGFR has been calculated using the CKD EPI equation. This calculation has not been validated in all clinical situations. eGFR's persistently <60 mL/min signify possible Chronic Kidney Disease.    Anion gap 9 5 - 15  Magnesium     Status: Abnormal   Collection Time: 04/18/17  3:50 PM  Result Value Ref Range   Magnesium 1.4 (L) 1.7 - 2.4 mg/dL  Urinalysis, Routine w reflex microscopic     Status: Abnormal   Collection Time: 04/18/17  3:51 PM  Result Value Ref Range   Color, Urine YELLOW YELLOW   APPearance HAZY (A) CLEAR   Specific Gravity, Urine 1.011 1.005 - 1.030   pH 5.0 5.0 - 8.0   Glucose, UA NEGATIVE NEGATIVE mg/dL   Hgb urine dipstick SMALL (A) NEGATIVE   Bilirubin Urine NEGATIVE NEGATIVE   Ketones, ur NEGATIVE NEGATIVE mg/dL   Protein, ur NEGATIVE NEGATIVE mg/dL   Nitrite NEGATIVE NEGATIVE   Leukocytes, UA SMALL (A) NEGATIVE   RBC / HPF 0-5 0 - 5 RBC/hpf   WBC, UA 6-30 0 - 5 WBC/hpf   Bacteria, UA MANY (A) NONE SEEN   Squamous Epithelial / LPF 0-5 (A) NONE SEEN   Mucus PRESENT   TSH     Status: None   Collection Time:  04/18/17  5:04 PM  Result Value Ref Range   TSH 2.898 0.350 - 4.500 uIU/mL    Comment: Performed by a 3rd Generation assay with a functional sensitivity of <=0.01 uIU/mL.  Troponin I     Status: Abnormal   Collection Time: 04/18/17  5:04 PM  Result Value Ref Range   Troponin I 0.03 (HH) <0.03 ng/mL    Comment: CRITICAL RESULT CALLED TO, READ BACK BY AND VERIFIED WITH: KENNEDYRRN [id number] MCCAULEG   Hemoglobin A1c     Status: Abnormal   Collection Time: 04/18/17  5:04 PM  Result Value Ref Range   Hgb A1c MFr Bld 5.7 (H) 4.8 - 5.6 %    Comment: (NOTE)         Prediabetes: 5.7 - 6.4         Diabetes: >6.4         Glycemic control for adults with diabetes: <7.0    Mean Plasma Glucose 117 mg/dL    Comment: (NOTE) Performed At: [facility] [address] [place], [place] [id number] Rush Farmer MD QG:[id number]   Glucose, capillary     Status: Abnormal   Collection Time: 04/18/17 10:20 PM  Result Value Ref Range   Glucose-Capillary 121 (H) 65 - 99 mg/dL  Troponin I     Status: Abnormal   Collection Time: 04/18/17 10:24 PM  Result Value Ref Range   Troponin I 0.06 (HH) <0.03 ng/mL    Comment: CRITICAL VALUE NOTED.  VALUE IS CONSISTENT WITH PREVIOUSLY REPORTED AND CALLED VALUE.  Troponin I     Status: Abnormal   Collection Time: 04/19/17  4:57 AM  Result Value Ref Range   Troponin I 0.07 (HH) <0.03 ng/mL    Comment: CRITICAL VALUE NOTED.  VALUE IS CONSISTENT WITH PREVIOUSLY REPORTED AND CALLED VALUE.  Basic metabolic panel     Status: Abnormal   Collection Time: 04/19/17  4:57 AM  Result Value Ref Range   Sodium 140 135 - 145 mmol/L   Potassium 3.8 3.5 - 5.1 mmol/L   Chloride 107 101 - 111 mmol/L   CO2 25 22 - 32 mmol/L   Glucose,  Bld 107 (H) 65 - 99 mg/dL   BUN 25 (H) 6 - 20 mg/dL   Creatinine, Ser 1.13 (H) 0.44 - 1.00 mg/dL   Calcium 9.7 8.9 - 10.3 mg/dL   GFR calc non Af Amer 44 (L) >60 mL/min   GFR calc Af Amer 51 (L) >60 mL/min    Comment: (NOTE) The  eGFR has been calculated using the CKD EPI equation. This calculation has not been validated in all clinical situations. eGFR's persistently <60 mL/min signify possible Chronic Kidney Disease.    Anion gap 8 5 - 15  Lipid panel     Status: Abnormal   Collection Time: 04/19/17  4:57 AM  Result Value Ref Range   Cholesterol 127 0 - 200 mg/dL   Triglycerides 91 <150 mg/dL   HDL 39 (L) >40 mg/dL   Total CHOL/HDL Ratio 3.3 RATIO   VLDL 18 0 - 40 mg/dL   LDL Cholesterol 70 0 - 99 mg/dL    Comment:        Total Cholesterol/HDL:CHD Risk Coronary Heart Disease Risk Table                     Men   Women  1/2 Average Risk   3.4   3.3  Average Risk       5.0   4.4  2 X Average Risk   9.6   7.1  3 X Average Risk  23.4   11.0        Use the calculated Patient Ratio above and the CHD Risk Table to determine the patient's CHD Risk.        ATP III CLASSIFICATION (LDL):  <100     mg/dL   Optimal  100-129  mg/dL   Near or Above                    Optimal  130-159  mg/dL   Borderline  160-189  mg/dL   High  >190     mg/dL   Very High   Glucose, capillary     Status: Abnormal   Collection Time: 04/19/17  7:27 AM  Result Value Ref Range   Glucose-Capillary 102 (H) 65 - 99 mg/dL  Glucose, capillary     Status: Abnormal   Collection Time: 04/19/17 11:00 AM  Result Value Ref Range   Glucose-Capillary 136 (H) 65 - 99 mg/dL  Urine Culture     Status: Abnormal   Collection Time: 04/19/17 11:07 AM  Result Value Ref Range   Specimen Description URINE, CLEAN CATCH    Special Requests NONE    Culture MULTIPLE SPECIES PRESENT, SUGGEST RECOLLECTION (A)    Report Status 04/20/2017 FINAL   Glucose, capillary     Status: Abnormal   Collection Time: 04/19/17  4:18 PM  Result Value Ref Range   Glucose-Capillary 102 (H) 65 - 99 mg/dL    Objective: General: Patient is awake, alert, and oriented x 3 and in no acute distress.  Integument: Skin is warm, dry and supple bilateral. Nails are short,  thickened and dystrophic with subungual debris, consistent with onychomycosis, 1-5 on right 1,2,4,5 on left. No signs of infection. + ulceration in area of previous callus left sub-met 1 that measures 1.9 x 0.5 x 0.1 cm larger than previous with macerated margin and granular base with no surrounding signs of infection.  Previous ulceration on right has completely healed over, remaining integument unremarkable.  Vasculature:  Dorsalis Pedis pulse 1/4 bilateral.  Posterior Tibial pulse  1/4 bilateral. Capillary fill time <3 sec 1-5 bilateral. Scant hair growth to the level of the digits.Temperature gradient within normal limits. Mild varicosities present bilateral. No edema present bilateral.   Neurology: The patient has absent sensation measured with a 5.07/10g Semmes Weinstein Monofilament at all pedal sites bilateral . Vibratory sensation absent bilateral with tuning fork. No Babinski sign present bilateral.   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 Asymptomatic bunion and hammertoe pedal deformities noted bilateral. Muscular strength 5/5 in all lower extremity muscular groups bilateral without pain on range of motion. No tenderness with calf compression bilateral.  Assessment and Plan: Problem List Items Addressed This Visit    None    Visit Diagnoses    Foot ulcer, left, limited to breakdown of skin ([facility])    -  Primary   Type 2 diabetes, controlled, with neuropathy ([facility])       Pre-ulcerative calluses       Hallux abductovalgus, acquired, unspecified laterality       Hammer toe, unspecified laterality       Acquired absence of other left toe(s) ([place])          -Examined patient. -Discussed and educated patient on diabetic foot care, especially with  regards to the vascular, neurological and musculoskeletal systems.  -Stressed the importance of good glycemic control and the detriment of not controlling glucose levels in relation to the foot. - Excisionally dedbrided ulcerations sub  met 1 on the left to healthy bleeding borders removing nonviable tissue using a sterile chisel blade. Wound measures post debridement as above. Wound was debrided to the level of the dermis with viable wound base exposed to promote healing. Hemostasis was achieved with manuel pressure. Patient tolerated procedure well without any discomfort or anesthesia necessary for this wound debridement.  -Applied Prisma collagen and dry sterile dressing and instructed patient to continue with daily dressings at home consisting of the same with offloading padding - Advised patient to go to the ER or return to office if the wound worsens or if constitutional symptoms are present. -Answered all patient questions -Patient to return in 3-4  weeks for follow up wound care  -Patient advised to call the office if any problems or questions arise in the meantime.  Landis Martins, DPM

## 2017-05-05 NOTE — Telephone Encounter (Signed)
I spoke with pt, pt confirmed she was asymptomatic on 05/02/17 at 8:07 PM (CT). Pt denies any symptoms since she has had event monitor, including lightheadedness/dizziness, feeling fast heart rate.  Pt has been rescheduled from January 2019 to see Roderic Palau, NP in the [facility] tomorrow at 10 AM, will fax tracings to [facility].

## 2017-05-06 ENCOUNTER — Encounter (HOSPITAL_COMMUNITY): Payer: Self-pay | Admitting: Nurse Practitioner

## 2017-05-06 ENCOUNTER — Ambulatory Visit (HOSPITAL_COMMUNITY)
Admission: RE | Admit: 2017-05-06 | Discharge: 2017-05-06 | Disposition: A | Discharge status: Home / Self Care | Payer: Medicare HMO | Source: Ambulatory Visit | Attending: Nurse Practitioner | Admitting: Nurse Practitioner

## 2017-05-06 VITALS — BP 152/84 | HR 82 | Ht 66.0 in | Wt 182.0 lb

## 2017-05-06 DIAGNOSIS — I10 Essential (primary) hypertension: Secondary | ICD-10-CM | POA: Diagnosis not present

## 2017-05-06 DIAGNOSIS — E785 Hyperlipidemia, unspecified: Secondary | ICD-10-CM | POA: Diagnosis not present

## 2017-05-06 DIAGNOSIS — I480 Paroxysmal atrial fibrillation: Secondary | ICD-10-CM

## 2017-05-06 DIAGNOSIS — Z7901 Long term (current) use of anticoagulants: Secondary | ICD-10-CM | POA: Insufficient documentation

## 2017-05-06 DIAGNOSIS — I491 Atrial premature depolarization: Secondary | ICD-10-CM | POA: Diagnosis not present

## 2017-05-06 DIAGNOSIS — E119 Type 2 diabetes mellitus without complications: Secondary | ICD-10-CM | POA: Diagnosis not present

## 2017-05-06 DIAGNOSIS — Z9889 Other specified postprocedural states: Secondary | ICD-10-CM | POA: Diagnosis not present

## 2017-05-06 DIAGNOSIS — Z79899 Other long term (current) drug therapy: Secondary | ICD-10-CM | POA: Diagnosis not present

## 2017-05-06 MED ORDER — AMIODARONE HCL 200 MG PO TABS: ORAL_TABLET | ORAL | 1 refills | Status: DC

## 2017-05-06 NOTE — Progress Notes (Signed)
Primary Care Physician: Nicoletta Dress, MD Referring Physician: [facility]  f/u   Maureen Stout is a 83 y.o. female with a h/o HTN, DM, that was recently hospitalized for new onset afib. She was grocery shopping and started feeling lightheaded and weak. She was taken by EMS to [facility] and admitted for RVR. She lives alone and is still very independent and drives where she needs to go. She converted in the hospital with Cardizem drip and went home in SR with a monitor in place. She did show an episode of afib while getting ready for bed with RVR on 12/16 that was not sustained and she was unaware with a v rate around 150 bpm. She is on BB that she has been on for some time with dose increased to 50 mg bid in hospital. Is on apixaban at 5 mg bid for a chadsvasc score of 5. No bleeding issues.  Today, she denies symptoms of palpitations, chest pain, shortness of breath, orthopnea, PND, lower extremity edema, dizziness, presyncope, syncope, or neurologic sequela. The patient is tolerating medications without difficulties and is otherwise without complaint today.   Past Medical History:  Diagnosis Date  . Bladder problem   . Diabetes mellitus without complication ([place])   . Dyslipidemia   . Hypertension   . Paroxysmal A-fib [facility])    Past Surgical History:  Procedure Laterality Date  . TOE SURGERY      Current Outpatient Medications  Medication Sig Dispense Refill  . allopurinol (ZYLOPRIM) 300 MG tablet Take 300 mg by mouth at bedtime.     . Alogliptin Benzoate (NESINA) 25 MG TABS Take 25 mg by mouth at bedtime.    Marland Kitchen apixaban (ELIQUIS) 5 MG TABS tablet Take 1 tablet (5 mg total) by mouth 2 (two) times daily. 60 tablet 2  . atorvastatin (LIPITOR) 10 MG tablet Take 10 mg by mouth at bedtime.    . Calcium Carbonate-Vitamin D (CALCIUM-D PO) Take 1 tablet by mouth 2 (two) times daily.    Marland Kitchen gemfibrozil (LOPID) 600 MG tablet Take 600 mg by mouth 2 (two) times daily.     . metoprolol tartrate (LOPRESSOR)  50 MG tablet Take 1 tablet (50 mg total) by mouth 2 (two) times daily. 60 tablet 2  . Multiple Vitamin (MULTIVITAMIN WITH MINERALS) TABS tablet Take 1 tablet by mouth daily. One A Day    . niacin 500 MG CR capsule Take 500 mg by mouth daily.    . Omega-3 Fatty Acids (FISH OIL) 1200 MG CAPS Take 1,200 mg by mouth 2 (two) times daily.    Marland Kitchen amiodarone (PACERONE) 200 MG tablet Take 1 tablet twice a day for 1 week then decrease to once a day 45 tablet 1   No current facility-administered medications for this encounter.     No Known Allergies  Social History   Socioeconomic History  . Marital status: Married    Spouse name: Not on file  . Number of children: Not on file  . Years of education: Not on file  . Highest education level: Not on file  Social Needs  . Financial resource strain: Not on file  . Food insecurity - worry: Not on file  . Food insecurity - inability: Not on file  . Transportation needs - medical: Not on file  . Transportation needs - non-medical: Not on file  Occupational History  . Not on file  Tobacco Use  . Smoking status: Never Smoker  . Smokeless tobacco: Never Used  Substance and Sexual Activity  . Alcohol use: No  . Drug use: No  . Sexual activity: Not on file  Other Topics Concern  . Not on file  Social History Narrative  . Not on file    No family history on file.  ROS- All systems are reviewed and negative except as per the HPI above  Physical Exam: Vitals:   05/06/17 1018  BP: (!) 152/84  Pulse: 82  Weight: 182 lb (82.6 kg)  Height: 5\' 6"  (1.676 m)   Wt Readings from Last 3 Encounters:  05/06/17 182 lb (82.6 kg)  04/19/17 183 lb 11.2 oz (83.3 kg)    Labs: Lab Results  Component Value Date   NA 140 04/19/2017   K 3.8 04/19/2017   CL 107 04/19/2017   CO2 25 04/19/2017   GLUCOSE 107 (H) 04/19/2017   BUN 25 (H) 04/19/2017   CREATININE 1.13 (H) 04/19/2017   CALCIUM 9.7 04/19/2017   MG 1.4 (L) 04/18/2017   No results found for:  INR Lab Results  Component Value Date   CHOL 127 04/19/2017   HDL 39 (L) 04/19/2017   LDLCALC 70 04/19/2017   TRIG 91 04/19/2017     GEN- The patient is well appearing, alert and oriented x 3 today.   Head- normocephalic, atraumatic Eyes-  Sclera clear, conjunctiva pink Ears- hearing intact Oropharynx- clear Neck- supple, no JVP Lymph- no cervical lymphadenopathy Lungs- Clear to ausculation bilaterally, normal work of breathing Heart- Regular rate and rhythm, no murmurs, rubs or gallops, PMI not laterally displaced GI- soft, NT, ND, + BS Extremities- no clubbing, cyanosis, or edema MS- no significant deformity or atrophy Skin- no rash or lesion Psych- euthymic mood, full affect Neuro- strength and sensation are intact  EKG-SR at 82 bpm, LAD, qrs int 112 ms, qt int 439 ms Echo-- Left ventricle: The cavity size was normal. Wall thickness was   increased in a pattern of mild LVH. Systolic function was normal.   The estimated ejection fraction was in the range of 60% to 65%.   Wall motion was normal; there were no regional wall motion   abnormalities. Doppler parameters are consistent with abnormal   left ventricular relaxation (grade 1 diastolic dysfunction).    Assessment and Plan: 1. Near onset symptomatic afib with RVR She is in SR in clinic but she is wearing event monitor and has shown breakthrough afib at 150 bpm She does lives alone and continues to drive and I fear she may get symptomatic with episodes of afib with rvr Discussed means to control afib including tikosyn, sotalol and Amiodarone Her renal function crcl cal is 39.75 and she would not qualify for sotalol and would only be able to use 125 mcg bid She would like to avoid expensive medications and hospitalizations Discussed use of amiodarone and side effect profile, reviewed baseline cxr, tsh and liver profile from recent hospitalization  Pt is for cheapest and easiest  initiation of drug Will start  amiodarone 200 mg bid x one week only and then reduce to 200 mg a day She will return to the afib clinic for f/u in one week and encouraged to call if any untoward reaction to drug I will leave her metoprolol at same dose as her HR is in the 80's and BP will go up if I reduce, today 152/84 She will continue eliquis 5 mg bid for a chadsvasc score of 5  Bleeding precautions reviewed Continue event monitor  See back in one  week  Geroge Baseman. Atarah Cadogan, [facility] [address] [place], [place] [address] [phone]

## 2017-05-06 NOTE — Patient Instructions (Signed)
Amiodarone 200mg  twice a day for 1 week

## 2017-05-07 DIAGNOSIS — Z79899 Other long term (current) drug therapy: Secondary | ICD-10-CM | POA: Diagnosis not present

## 2017-05-07 DIAGNOSIS — I482 Chronic atrial fibrillation: Secondary | ICD-10-CM | POA: Diagnosis not present

## 2017-05-14 ENCOUNTER — Ambulatory Visit (HOSPITAL_COMMUNITY)
Admission: RE | Admit: 2017-05-14 | Discharge: 2017-05-14 | Disposition: A | Discharge status: Home / Self Care | Payer: Medicare HMO | Source: Ambulatory Visit | Attending: Nurse Practitioner | Admitting: Nurse Practitioner

## 2017-05-14 DIAGNOSIS — Z79899 Other long term (current) drug therapy: Secondary | ICD-10-CM | POA: Diagnosis not present

## 2017-05-14 DIAGNOSIS — I4891 Unspecified atrial fibrillation: Secondary | ICD-10-CM | POA: Insufficient documentation

## 2017-05-14 MED ORDER — AMIODARONE HCL 200 MG PO TABS: 200.0000 mg | ORAL_TABLET | Freq: Every day | ORAL | 1 refills | Status: DC

## 2017-05-14 NOTE — Patient Instructions (Signed)
Your physician has recommended you make the following change in your medication:  1)Amiodarone 200mg  once a day

## 2017-05-14 NOTE — Progress Notes (Addendum)
Pt in for EKG after starting amiodarone.  To be reviewed by Roderic Palau, NP  Pt started on amiodarone 200 mg bid for episodes of afib with RVR, EKG shows SR at 70 bpm, qtc 455 ms. She is still wearing event monitor, no awareness of afib. She will reduce amiodarone to one tab a day. See back in one month

## 2017-05-27 ENCOUNTER — Encounter: Payer: Self-pay | Admitting: Sports Medicine

## 2017-05-27 ENCOUNTER — Ambulatory Visit (INDEPENDENT_AMBULATORY_CARE_PROVIDER_SITE_OTHER): Payer: Medicare HMO | Admitting: Sports Medicine

## 2017-05-27 DIAGNOSIS — L84 Corns and callosities: Secondary | ICD-10-CM

## 2017-05-27 DIAGNOSIS — L97511 Non-pressure chronic ulcer of other part of right foot limited to breakdown of skin: Secondary | ICD-10-CM | POA: Diagnosis not present

## 2017-05-27 DIAGNOSIS — M2010 Hallux valgus (acquired), unspecified foot: Secondary | ICD-10-CM

## 2017-05-27 DIAGNOSIS — E1140 Type 2 diabetes mellitus with diabetic neuropathy, unspecified: Secondary | ICD-10-CM

## 2017-05-27 DIAGNOSIS — M2040 Other hammer toe(s) (acquired), unspecified foot: Secondary | ICD-10-CM

## 2017-05-27 NOTE — Progress Notes (Signed)
Subjective: Maureen Stout is a 84 y.o. female patient with history of diabetes who returns to office today for evaluation of bilateral foot ulcerations. Reports that the left one has been dry the right one seems like it is opening up again and patient reports that she returned to using silvadene cream. Patient denies nausea, vomiting, fever chills or any other consitutional symptoms.  Still wearing heart monitor and will have to get it checked later this month  FBS this AM-not recorded  Patient Active Problem List   Diagnosis Date Noted  . Pressure injury of skin 04/19/2017  . Atrial fibrillation with rapid ventricular response ([place]) 04/18/2017   Current Outpatient Medications on File Prior to Visit  Medication Sig Dispense Refill  . allopurinol (ZYLOPRIM) 300 MG tablet Take 300 mg by mouth at bedtime.     . Alogliptin Benzoate (NESINA) 25 MG TABS Take 25 mg by mouth at bedtime.    Marland Kitchen amiodarone (PACERONE) 200 MG tablet Take 1 tablet (200 mg total) by mouth daily. 45 tablet 1  . amoxicillin-clavulanate (AUGMENTIN) 875-125 MG tablet     . apixaban (ELIQUIS) 5 MG TABS tablet Take 1 tablet (5 mg total) by mouth 2 (two) times daily. 60 tablet 2  . atorvastatin (LIPITOR) 10 MG tablet Take 10 mg by mouth at bedtime.    . Calcium Carbonate-Vitamin D (CALCIUM-D PO) Take 1 tablet by mouth 2 (two) times daily.    Marland Kitchen gemfibrozil (LOPID) 600 MG tablet Take 600 mg by mouth 2 (two) times daily.     . metoprolol tartrate (LOPRESSOR) 50 MG tablet Take 1 tablet (50 mg total) by mouth 2 (two) times daily. 60 tablet 2  . Multiple Vitamin (MULTI-VITAMINS) TABS Take by mouth.    . Multiple Vitamin (MULTIVITAMIN WITH MINERALS) TABS tablet Take 1 tablet by mouth daily. One A Day    . niacin 500 MG CR capsule Take 500 mg by mouth daily.    . Omega-3 Fatty Acids (FISH OIL) 1200 MG CAPS Take 1,200 mg by mouth 2 (two) times daily.     No current facility-administered medications on file prior to visit.    No Known  Allergies  Recent Results (from the past 2160 hour(s))  CBC     Status: Abnormal   Collection Time: 04/18/17  3:50 PM  Result Value Ref Range   WBC 8.2 4.0 - 10.5 K/uL   RBC 3.74 (L) 3.87 - 5.11 MIL/uL   Hemoglobin 11.8 (L) 12.0 - 15.0 g/dL   HCT 36.2 36.0 - 46.0 %   MCV 96.8 78.0 - 100.0 fL   MCH 31.6 26.0 - 34.0 pg   MCHC 32.6 30.0 - 36.0 g/dL   RDW 14.0 11.5 - 15.5 %   Platelets 194 150 - 400 K/uL  Comprehensive metabolic panel     Status: Abnormal   Collection Time: 04/18/17  3:50 PM  Result Value Ref Range   Sodium 138 135 - 145 mmol/L   Potassium 3.8 3.5 - 5.1 mmol/L   Chloride 104 101 - 111 mmol/L   CO2 25 22 - 32 mmol/L   Glucose, Bld 161 (H) 65 - 99 mg/dL   BUN 28 (H) 6 - 20 mg/dL   Creatinine, Ser 1.40 (H) 0.44 - 1.00 mg/dL   Calcium 9.9 8.9 - 10.3 mg/dL   Total Protein 6.4 (L) 6.5 - 8.1 g/dL   Albumin 3.8 3.5 - 5.0 g/dL   AST 25 15 - 41 U/L   ALT 13 (L) 14 -  54 U/L   Alkaline Phosphatase 67 38 - 126 U/L   Total Bilirubin 0.8 0.3 - 1.2 mg/dL   GFR calc non Af Amer 34 (L) >60 mL/min   GFR calc Af Amer 39 (L) >60 mL/min    Comment: (NOTE) The eGFR has been calculated using the CKD EPI equation. This calculation has not been validated in all clinical situations. eGFR's persistently <60 mL/min signify possible Chronic Kidney Disease.    Anion gap 9 5 - 15  Magnesium     Status: Abnormal   Collection Time: 04/18/17  3:50 PM  Result Value Ref Range   Magnesium 1.4 (L) 1.7 - 2.4 mg/dL  Urinalysis, Routine w reflex microscopic     Status: Abnormal   Collection Time: 04/18/17  3:51 PM  Result Value Ref Range   Color, Urine YELLOW YELLOW   APPearance HAZY (A) CLEAR   Specific Gravity, Urine 1.011 1.005 - 1.030   pH 5.0 5.0 - 8.0   Glucose, UA NEGATIVE NEGATIVE mg/dL   Hgb urine dipstick SMALL (A) NEGATIVE   Bilirubin Urine NEGATIVE NEGATIVE   Ketones, ur NEGATIVE NEGATIVE mg/dL   Protein, ur NEGATIVE NEGATIVE mg/dL   Nitrite NEGATIVE NEGATIVE   Leukocytes, UA  SMALL (A) NEGATIVE   RBC / HPF 0-5 0 - 5 RBC/hpf   WBC, UA 6-30 0 - 5 WBC/hpf   Bacteria, UA MANY (A) NONE SEEN   Squamous Epithelial / LPF 0-5 (A) NONE SEEN   Mucus PRESENT   TSH     Status: None   Collection Time: 04/18/17  5:04 PM  Result Value Ref Range   TSH 2.898 0.350 - 4.500 uIU/mL    Comment: Performed by a 3rd Generation assay with a functional sensitivity of <=0.01 uIU/mL.  Troponin I     Status: Abnormal   Collection Time: 04/18/17  5:04 PM  Result Value Ref Range   Troponin I 0.03 (HH) <0.03 ng/mL    Comment: CRITICAL RESULT CALLED TO, READ BACK BY AND VERIFIED WITH: KENNEDYRRN [id number] MCCAULEG   Hemoglobin A1c     Status: Abnormal   Collection Time: 04/18/17  5:04 PM  Result Value Ref Range   Hgb A1c MFr Bld 5.7 (H) 4.8 - 5.6 %    Comment: (NOTE)         Prediabetes: 5.7 - 6.4         Diabetes: >6.4         Glycemic control for adults with diabetes: <7.0    Mean Plasma Glucose 117 mg/dL    Comment: (NOTE) Performed At: [facility] [address] [place], [place] [id number] Rush Farmer MD [id number]   Glucose, capillary     Status: Abnormal   Collection Time: 04/18/17 10:20 PM  Result Value Ref Range   Glucose-Capillary 121 (H) 65 - 99 mg/dL  Troponin I     Status: Abnormal   Collection Time: 04/18/17 10:24 PM  Result Value Ref Range   Troponin I 0.06 (HH) <0.03 ng/mL    Comment: CRITICAL VALUE NOTED.  VALUE IS CONSISTENT WITH PREVIOUSLY REPORTED AND CALLED VALUE.  Troponin I     Status: Abnormal   Collection Time: 04/19/17  4:57 AM  Result Value Ref Range   Troponin I 0.07 (HH) <0.03 ng/mL    Comment: CRITICAL VALUE NOTED.  VALUE IS CONSISTENT WITH PREVIOUSLY REPORTED AND CALLED VALUE.  Basic metabolic panel     Status: Abnormal   Collection Time: 04/19/17  4:57 AM  Result  Value Ref Range   Sodium 140 135 - 145 mmol/L   Potassium 3.8 3.5 - 5.1 mmol/L   Chloride 107 101 - 111 mmol/L   CO2 25 22 - 32 mmol/L   Glucose, Bld 107 (H) 65  - 99 mg/dL   BUN 25 (H) 6 - 20 mg/dL   Creatinine, Ser 1.13 (H) 0.44 - 1.00 mg/dL   Calcium 9.7 8.9 - 10.3 mg/dL   GFR calc non Af Amer 44 (L) >60 mL/min   GFR calc Af Amer 51 (L) >60 mL/min    Comment: (NOTE) The eGFR has been calculated using the CKD EPI equation. This calculation has not been validated in all clinical situations. eGFR's persistently <60 mL/min signify possible Chronic Kidney Disease.    Anion gap 8 5 - 15  Lipid panel     Status: Abnormal   Collection Time: 04/19/17  4:57 AM  Result Value Ref Range   Cholesterol 127 0 - 200 mg/dL   Triglycerides 91 <150 mg/dL   HDL 39 (L) >40 mg/dL   Total CHOL/HDL Ratio 3.3 RATIO   VLDL 18 0 - 40 mg/dL   LDL Cholesterol 70 0 - 99 mg/dL    Comment:        Total Cholesterol/HDL:CHD Risk Coronary Heart Disease Risk Table                     Men   Women  1/2 Average Risk   3.4   3.3  Average Risk       5.0   4.4  2 X Average Risk   9.6   7.1  3 X Average Risk  23.4   11.0        Use the calculated Patient Ratio above and the CHD Risk Table to determine the patient's CHD Risk.        ATP III CLASSIFICATION (LDL):  <100     mg/dL   Optimal  100-129  mg/dL   Near or Above                    Optimal  130-159  mg/dL   Borderline  160-189  mg/dL   High  >190     mg/dL   Very High   Glucose, capillary     Status: Abnormal   Collection Time: 04/19/17  7:27 AM  Result Value Ref Range   Glucose-Capillary 102 (H) 65 - 99 mg/dL  Glucose, capillary     Status: Abnormal   Collection Time: 04/19/17 11:00 AM  Result Value Ref Range   Glucose-Capillary 136 (H) 65 - 99 mg/dL  Urine Culture     Status: Abnormal   Collection Time: 04/19/17 11:07 AM  Result Value Ref Range   Specimen Description URINE, CLEAN CATCH    Special Requests NONE    Culture MULTIPLE SPECIES PRESENT, SUGGEST RECOLLECTION (A)    Report Status 04/20/2017 FINAL   Glucose, capillary     Status: Abnormal   Collection Time: 04/19/17  4:18 PM  Result Value Ref  Range   Glucose-Capillary 102 (H) 65 - 99 mg/dL    Objective: General: Patient is awake, alert, and oriented x 3 and in no acute distress.  Integument: Skin is warm, dry and supple bilateral. Nails are short, thickened and dystrophic with subungual debris, consistent with onychomycosis, 1-5 on right 1,2,4,5 on left. No signs of infection. + callus left sub-met 1and + reulceration sub met 1 on right that measures 1.5  x 0.8 x 0.1 cm larger than previous with keratotic margin and granular base with no surrounding signs of infection.  Previous ulceration on right has completely healed over, remaining integument unremarkable.  Vasculature:  Dorsalis Pedis pulse 1/4 bilateral. Posterior Tibial pulse  1/4 bilateral. Capillary fill time <3 sec 1-5 bilateral. Scant hair growth to the level of the digits.Temperature gradient within normal limits. Mild varicosities present bilateral. No edema present bilateral.   Neurology: The patient has absent sensation measured with a 5.07/10g Semmes Weinstein Monofilament at all pedal sites bilateral . Vibratory sensation absent bilateral with tuning fork. No Babinski sign present bilateral.   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 Asymptomatic bunion and hammertoe pedal deformities noted bilateral. Muscular strength 5/5 in all lower extremity muscular groups bilateral without pain on range of motion. No tenderness with calf compression bilateral.  Assessment and Plan: Problem List Items Addressed This Visit    None    Visit Diagnoses    Foot ulcer, limited to breakdown of skin, right (HCC)    -  Primary   Type 2 diabetes, controlled, with neuropathy (HCC)       Pre-ulcerative calluses       Hallux abductovalgus, acquired, unspecified laterality       Hammer toe, unspecified laterality          -Examined patient. -Discussed and educated patient on diabetic foot care, especially with  regards to the vascular, neurological and musculoskeletal  systems.  -Stressed the importance of good glycemic control and the detriment of not controlling glucose levels in relation to the foot. - Excisionally dedbrided ulceration sub met 1 on right to healthy bleeding borders removing nonviable tissue using a sterile chisel blade. Wound measures post debridement as above. Wound was debrided to the level of the dermis with viable wound base exposed to promote healing. Hemostasis was achieved with manuel pressure. Patient tolerated procedure well without any discomfort or anesthesia necessary for this wound debridement.  -Applied iodosorb and dry sterile dressing and instructed patient to continue with daily dressings at home consisting of the silvadene with offloading padding - Advised patient to go to the ER or return to office if the wound worsens or if constitutional symptoms are present. -Answered all patient questions -Patient to return in 3-4 weeks for follow up wound care  -Patient advised to call the office if any problems or questions arise in the meantime.  Landis Martins, DPM

## 2017-06-08 ENCOUNTER — Ambulatory Visit (HOSPITAL_COMMUNITY): Payer: Medicare HMO | Admitting: Nurse Practitioner

## 2017-06-10 DIAGNOSIS — M109 Gout, unspecified: Secondary | ICD-10-CM | POA: Diagnosis not present

## 2017-06-10 DIAGNOSIS — I129 Hypertensive chronic kidney disease with stage 1 through stage 4 chronic kidney disease, or unspecified chronic kidney disease: Secondary | ICD-10-CM | POA: Diagnosis not present

## 2017-06-10 DIAGNOSIS — I482 Chronic atrial fibrillation: Secondary | ICD-10-CM | POA: Diagnosis not present

## 2017-06-10 DIAGNOSIS — E1142 Type 2 diabetes mellitus with diabetic polyneuropathy: Secondary | ICD-10-CM | POA: Diagnosis not present

## 2017-06-10 DIAGNOSIS — D638 Anemia in other chronic diseases classified elsewhere: Secondary | ICD-10-CM | POA: Diagnosis not present

## 2017-06-10 DIAGNOSIS — E1122 Type 2 diabetes mellitus with diabetic chronic kidney disease: Secondary | ICD-10-CM | POA: Diagnosis not present

## 2017-06-10 DIAGNOSIS — E785 Hyperlipidemia, unspecified: Secondary | ICD-10-CM | POA: Diagnosis not present

## 2017-06-14 DIAGNOSIS — N3020 Other chronic cystitis without hematuria: Secondary | ICD-10-CM | POA: Diagnosis not present

## 2017-06-14 DIAGNOSIS — N3941 Urge incontinence: Secondary | ICD-10-CM | POA: Diagnosis not present

## 2017-06-14 DIAGNOSIS — N3281 Overactive bladder: Secondary | ICD-10-CM | POA: Diagnosis not present

## 2017-06-17 ENCOUNTER — Ambulatory Visit (HOSPITAL_COMMUNITY)
Admission: RE | Admit: 2017-06-17 | Discharge: 2017-06-17 | Disposition: A | Discharge status: Home / Self Care | Payer: Medicare PPO | Source: Ambulatory Visit | Attending: Nurse Practitioner | Admitting: Nurse Practitioner

## 2017-06-17 ENCOUNTER — Encounter (HOSPITAL_COMMUNITY): Payer: Self-pay | Admitting: Nurse Practitioner

## 2017-06-17 VITALS — BP 176/84 | HR 63 | Ht 66.0 in | Wt 186.0 lb

## 2017-06-17 DIAGNOSIS — I10 Essential (primary) hypertension: Secondary | ICD-10-CM | POA: Diagnosis not present

## 2017-06-17 DIAGNOSIS — Z7901 Long term (current) use of anticoagulants: Secondary | ICD-10-CM | POA: Diagnosis not present

## 2017-06-17 DIAGNOSIS — Z79899 Other long term (current) drug therapy: Secondary | ICD-10-CM | POA: Insufficient documentation

## 2017-06-17 DIAGNOSIS — E119 Type 2 diabetes mellitus without complications: Secondary | ICD-10-CM | POA: Insufficient documentation

## 2017-06-17 DIAGNOSIS — I4891 Unspecified atrial fibrillation: Secondary | ICD-10-CM | POA: Diagnosis present

## 2017-06-17 DIAGNOSIS — E785 Hyperlipidemia, unspecified: Secondary | ICD-10-CM | POA: Insufficient documentation

## 2017-06-17 DIAGNOSIS — N390 Urinary tract infection, site not specified: Secondary | ICD-10-CM | POA: Diagnosis not present

## 2017-06-17 DIAGNOSIS — I480 Paroxysmal atrial fibrillation: Secondary | ICD-10-CM | POA: Insufficient documentation

## 2017-06-17 LAB — COMPREHENSIVE METABOLIC PANEL
ALBUMIN: 3.7 g/dL (ref 3.5–5.0)
ALT: 11 U/L — ABNORMAL LOW (ref 14–54)
ANION GAP: 9 (ref 5–15)
AST: 19 U/L (ref 15–41)
Alkaline Phosphatase: 82 U/L (ref 38–126)
BILIRUBIN TOTAL: 0.7 mg/dL (ref 0.3–1.2)
BUN: 20 mg/dL (ref 6–20)
CHLORIDE: 108 mmol/L (ref 101–111)
CO2: 24 mmol/L (ref 22–32)
Calcium: 9.5 mg/dL (ref 8.9–10.3)
Creatinine, Ser: 1.07 mg/dL — ABNORMAL HIGH (ref 0.44–1.00)
GFR calc Af Amer: 54 mL/min — ABNORMAL LOW (ref >60)
GFR calc non Af Amer: 47 mL/min — ABNORMAL LOW (ref >60)
GLUCOSE: 94 mg/dL (ref 65–99)
POTASSIUM: 4.1 mmol/L (ref 3.5–5.1)
Sodium: 141 mmol/L (ref 135–145)
TOTAL PROTEIN: 6.6 g/dL (ref 6.5–8.1)

## 2017-06-17 LAB — TSH: TSH: 6.301 u[IU]/mL — AB (ref 0.350–4.500)

## 2017-06-17 LAB — T4, FREE: Free T4: 0.7 ng/dL (ref 0.61–1.12)

## 2017-06-17 MED ORDER — APIXABAN 5 MG PO TABS: 5.0000 mg | ORAL_TABLET | Freq: Two times a day (BID) | ORAL | 2 refills | Status: DC

## 2017-06-17 MED ORDER — AMIODARONE HCL 200 MG PO TABS: 200.0000 mg | ORAL_TABLET | Freq: Every day | ORAL | 2 refills | Status: DC

## 2017-06-17 NOTE — Addendum Note (Signed)
Encounter addended by: Juluis Mire, RN on: 06/17/2017 12:10 PM  Actions taken: Order list changed

## 2017-06-17 NOTE — Progress Notes (Signed)
Primary Care Physician: Nicoletta Dress, MD Referring Physician: [facility]  f/u   Maureen Stout is a 84 y.o. female with a h/o HTN, DM, that was recently hospitalized for new onset afib. She was grocery shopping and started feeling lightheaded and weak. She was taken by EMS to [facility] and admitted for RVR. She lives alone and is still very independent and drives where she needs to go. She converted in the hospital with Cardizem drip and went home in SR with a monitor in place. She did show an episode of afib while getting ready for bed with RVR on 12/16 that was not sustained and she was unaware with a v rate around 150 bpm. She is on BB that she has been on for some time with dose increased to 50 mg bid in hospital. Is on apixaban at 5 mg bid for a chadsvasc score of 5. No bleeding issues.  F/u in afib clinic 1/31. She feels well on amiodarone. No more episodes of heart racing/weakness. She is currently on antibiotic for UTI and received a Botox shot in the bladder 2 days ago for help with  frequent UTI's. Her BP is up today but states a long drive from [place]. In the PCP office, a few days ago, she had a BP of 128/76 per pt. She has also been taking her Eliquis wrong, just taking one a day, informed that she is not protected this way from stroke, has to take drug bid.  Today, she denies symptoms of palpitations, chest pain, shortness of breath, orthopnea, PND, lower extremity edema, dizziness, presyncope, syncope, or neurologic sequela. The patient is tolerating medications without difficulties and is otherwise without complaint today.   Past Medical History:  Diagnosis Date  . Bladder problem   . Diabetes mellitus without complication ([place])   . Dyslipidemia   . Hypertension   . Paroxysmal A-fib [facility])    Past Surgical History:  Procedure Laterality Date  . TOE SURGERY      Current Outpatient Medications  Medication Sig Dispense Refill  . allopurinol (ZYLOPRIM) 300 MG tablet Take 300 mg by  mouth at bedtime.     . Alogliptin Benzoate (NESINA) 25 MG TABS Take 25 mg by mouth at bedtime.    Marland Kitchen amiodarone (PACERONE) 200 MG tablet Take 1 tablet (200 mg total) by mouth daily. 45 tablet 1  . amoxicillin-clavulanate (AUGMENTIN) 875-125 MG tablet     . apixaban (ELIQUIS) 5 MG TABS tablet Take 1 tablet (5 mg total) by mouth 2 (two) times daily. 60 tablet 2  . atorvastatin (LIPITOR) 10 MG tablet Take 10 mg by mouth at bedtime.    . Calcium Carbonate-Vitamin D (CALCIUM-D PO) Take 1 tablet by mouth 2 (two) times daily.    Marland Kitchen gemfibrozil (LOPID) 600 MG tablet Take 600 mg by mouth 2 (two) times daily.     . metoprolol tartrate (LOPRESSOR) 50 MG tablet Take 1 tablet (50 mg total) by mouth 2 (two) times daily. 60 tablet 2  . Multiple Vitamin (MULTI-VITAMINS) TABS Take by mouth.    . Multiple Vitamin (MULTIVITAMIN WITH MINERALS) TABS tablet Take 1 tablet by mouth daily. One A Day    . niacin 500 MG CR capsule Take 500 mg by mouth daily.    . Omega-3 Fatty Acids (FISH OIL) 1200 MG CAPS Take 1,200 mg by mouth 2 (two) times daily.     No current facility-administered medications for this encounter.     No Known Allergies  Social  History   Socioeconomic History  . Marital status: Married    Spouse name: Not on file  . Number of children: Not on file  . Years of education: Not on file  . Highest education level: Not on file  Social Needs  . Financial resource strain: Not on file  . Food insecurity - worry: Not on file  . Food insecurity - inability: Not on file  . Transportation needs - medical: Not on file  . Transportation needs - non-medical: Not on file  Occupational History  . Not on file  Tobacco Use  . Smoking status: Never Smoker  . Smokeless tobacco: Never Used  Substance and Sexual Activity  . Alcohol use: No  . Drug use: No  . Sexual activity: Not on file  Other Topics Concern  . Not on file  Social History Narrative  . Not on file    No family history on file.  ROS-  All systems are reviewed and negative except as per the HPI above  Physical Exam: Vitals:   06/17/17 0937  BP: (!) 176/84  Pulse: 63  Weight: 186 lb (84.4 kg)  Height: 5\' 6"  (1.676 m)   Wt Readings from Last 3 Encounters:  06/17/17 186 lb (84.4 kg)  05/06/17 182 lb (82.6 kg)  04/19/17 183 lb 11.2 oz (83.3 kg)    Labs: Lab Results  Component Value Date   NA 140 04/19/2017   K 3.8 04/19/2017   CL 107 04/19/2017   CO2 25 04/19/2017   GLUCOSE 107 (H) 04/19/2017   BUN 25 (H) 04/19/2017   CREATININE 1.13 (H) 04/19/2017   CALCIUM 9.7 04/19/2017   MG 1.4 (L) 04/18/2017   No results found for: INR Lab Results  Component Value Date   CHOL 127 04/19/2017   HDL 39 (L) 04/19/2017   LDLCALC 70 04/19/2017   TRIG 91 04/19/2017     GEN- The patient is well appearing, alert and oriented x 3 today.   Head- normocephalic, atraumatic Eyes-  Sclera clear, conjunctiva pink Ears- hearing intact Oropharynx- clear Neck- supple, no JVP Lymph- no cervical lymphadenopathy Lungs- Clear to ausculation bilaterally, normal work of breathing Heart- regular rate and rhythm, no murmurs, rubs or gallops, PMI not laterally displaced GI- soft, NT, ND, + BS Extremities- no clubbing, cyanosis, or edema MS- no significant deformity or atrophy Skin- no rash or lesion Psych- euthymic mood, full affect Neuro- strength and sensation are intact  EKG-SR at 63 bpm, LAD, pr int 184 ms, qrs int 116 ms, qt int 458 ms Echo-- Left ventricle: The cavity size was normal. Wall thickness was   increased in a pattern of mild LVH. Systolic function was normal.   The estimated ejection fraction was in the range of 60% to 65%.   Wall motion was normal; there were no regional wall motion   abnormalities. Doppler parameters are consistent with abnormal   left ventricular relaxation (grade 1 diastolic dysfunction).    Assessment and Plan: 1. Near onset symptomatic afib with RVR Maintaining  SR on amiodarone 200 mg  a day Tsh/cmet today  2. Chadsvasc score of 5  Pt misunderstood and has been taking eliquis 5 mg just once daily Informed that she needs bid to fully  protect her against stroke and she voiced understanding   F/u in 3 months  Butch Penny C. Carroll, [facility] [address] [place], [place] [id number] [phone]

## 2017-06-18 ENCOUNTER — Encounter (HOSPITAL_COMMUNITY): Payer: Self-pay | Admitting: *Deleted

## 2017-06-18 ENCOUNTER — Other Ambulatory Visit (HOSPITAL_COMMUNITY): Payer: Self-pay | Admitting: *Deleted

## 2017-06-18 DIAGNOSIS — I4819 Other persistent atrial fibrillation: Secondary | ICD-10-CM

## 2017-06-18 LAB — T3, FREE: T3, Free: 1.9 pg/mL — ABNORMAL LOW (ref 2.0–4.4)

## 2017-06-24 ENCOUNTER — Encounter: Payer: Self-pay | Admitting: Sports Medicine

## 2017-06-24 ENCOUNTER — Ambulatory Visit (INDEPENDENT_AMBULATORY_CARE_PROVIDER_SITE_OTHER): Payer: Medicare PPO | Admitting: Sports Medicine

## 2017-06-24 DIAGNOSIS — L97511 Non-pressure chronic ulcer of other part of right foot limited to breakdown of skin: Secondary | ICD-10-CM

## 2017-06-24 DIAGNOSIS — L84 Corns and callosities: Secondary | ICD-10-CM

## 2017-06-24 DIAGNOSIS — M2010 Hallux valgus (acquired), unspecified foot: Secondary | ICD-10-CM

## 2017-06-24 DIAGNOSIS — M2040 Other hammer toe(s) (acquired), unspecified foot: Secondary | ICD-10-CM

## 2017-06-24 DIAGNOSIS — E1140 Type 2 diabetes mellitus with diabetic neuropathy, unspecified: Secondary | ICD-10-CM

## 2017-06-24 DIAGNOSIS — B351 Tinea unguium: Secondary | ICD-10-CM | POA: Diagnosis not present

## 2017-06-24 NOTE — Progress Notes (Signed)
Subjective: Maureen Stout is a 84 y.o. female patient with history of diabetes who returns to office today for evaluation of right foot ulceration and for diabetic nail care. Reports that the right one seems like it is healing and has been dry. Reports that the left one hasn't given her a problem. Had her heart checked and her urologist changed meds. Patient denies nausea, vomiting, fever chills or any other consitutional symptoms.  FBS this AM-not recorded  Patient Active Problem List   Diagnosis Date Noted  . Pressure injury of skin 04/19/2017  . Atrial fibrillation with rapid ventricular response ([place]) 04/18/2017   Current Outpatient Medications on File Prior to Visit  Medication Sig Dispense Refill  . allopurinol (ZYLOPRIM) 300 MG tablet Take 300 mg by mouth at bedtime.     . Alogliptin Benzoate (NESINA) 25 MG TABS Take 25 mg by mouth at bedtime.    Marland Kitchen amiodarone (PACERONE) 200 MG tablet Take 1 tablet (200 mg total) by mouth daily. 90 tablet 2  . amoxicillin-clavulanate (AUGMENTIN) 875-125 MG tablet     . apixaban (ELIQUIS) 5 MG TABS tablet Take 1 tablet (5 mg total) by mouth 2 (two) times daily. 180 tablet 2  . atorvastatin (LIPITOR) 10 MG tablet Take 10 mg by mouth at bedtime.    . Calcium Carbonate-Vitamin D (CALCIUM-D PO) Take 1 tablet by mouth 2 (two) times daily.    Marland Kitchen gemfibrozil (LOPID) 600 MG tablet Take 600 mg by mouth 2 (two) times daily.     . metoprolol tartrate (LOPRESSOR) 50 MG tablet Take 1 tablet (50 mg total) by mouth 2 (two) times daily. 60 tablet 2  . Multiple Vitamin (MULTI-VITAMINS) TABS Take by mouth.    . Multiple Vitamin (MULTIVITAMIN WITH MINERALS) TABS tablet Take 1 tablet by mouth daily. One A Day    . niacin 500 MG CR capsule Take 500 mg by mouth daily.    . Omega-3 Fatty Acids (FISH OIL) 1200 MG CAPS Take 1,200 mg by mouth 2 (two) times daily.     No current facility-administered medications on file prior to visit.    No Known Allergies  Recent Results  (from the past 2160 hour(s))  CBC     Status: Abnormal   Collection Time: 04/18/17  3:50 PM  Result Value Ref Range   WBC 8.2 4.0 - 10.5 K/uL   RBC 3.74 (L) 3.87 - 5.11 MIL/uL   Hemoglobin 11.8 (L) 12.0 - 15.0 g/dL   HCT 36.2 36.0 - 46.0 %   MCV 96.8 78.0 - 100.0 fL   MCH 31.6 26.0 - 34.0 pg   MCHC 32.6 30.0 - 36.0 g/dL   RDW 14.0 11.5 - 15.5 %   Platelets 194 150 - 400 K/uL  Comprehensive metabolic panel     Status: Abnormal   Collection Time: 04/18/17  3:50 PM  Result Value Ref Range   Sodium 138 135 - 145 mmol/L   Potassium 3.8 3.5 - 5.1 mmol/L   Chloride 104 101 - 111 mmol/L   CO2 25 22 - 32 mmol/L   Glucose, Bld 161 (H) 65 - 99 mg/dL   BUN 28 (H) 6 - 20 mg/dL   Creatinine, Ser 1.40 (H) 0.44 - 1.00 mg/dL   Calcium 9.9 8.9 - 10.3 mg/dL   Total Protein 6.4 (L) 6.5 - 8.1 g/dL   Albumin 3.8 3.5 - 5.0 g/dL   AST 25 15 - 41 U/L   ALT 13 (L) 14 - 54 U/L  Alkaline Phosphatase 67 38 - 126 U/L   Total Bilirubin 0.8 0.3 - 1.2 mg/dL   GFR calc non Af Amer 34 (L) >60 mL/min   GFR calc Af Amer 39 (L) >60 mL/min    Comment: (NOTE) The eGFR has been calculated using the CKD EPI equation. This calculation has not been validated in all clinical situations. eGFR's persistently <60 mL/min signify possible Chronic Kidney Disease.    Anion gap 9 5 - 15  Magnesium     Status: Abnormal   Collection Time: 04/18/17  3:50 PM  Result Value Ref Range   Magnesium 1.4 (L) 1.7 - 2.4 mg/dL  Urinalysis, Routine w reflex microscopic     Status: Abnormal   Collection Time: 04/18/17  3:51 PM  Result Value Ref Range   Color, Urine YELLOW YELLOW   APPearance HAZY (A) CLEAR   Specific Gravity, Urine 1.011 1.005 - 1.030   pH 5.0 5.0 - 8.0   Glucose, UA NEGATIVE NEGATIVE mg/dL   Hgb urine dipstick SMALL (A) NEGATIVE   Bilirubin Urine NEGATIVE NEGATIVE   Ketones, ur NEGATIVE NEGATIVE mg/dL   Protein, ur NEGATIVE NEGATIVE mg/dL   Nitrite NEGATIVE NEGATIVE   Leukocytes, UA SMALL (A) NEGATIVE   RBC /  HPF 0-5 0 - 5 RBC/hpf   WBC, UA 6-30 0 - 5 WBC/hpf   Bacteria, UA MANY (A) NONE SEEN   Squamous Epithelial / LPF 0-5 (A) NONE SEEN   Mucus PRESENT   TSH     Status: None   Collection Time: 04/18/17  5:04 PM  Result Value Ref Range   TSH 2.898 0.350 - 4.500 uIU/mL    Comment: Performed by a 3rd Generation assay with a functional sensitivity of <=0.01 uIU/mL.  Troponin I     Status: Abnormal   Collection Time: 04/18/17  5:04 PM  Result Value Ref Range   Troponin I 0.03 (HH) <0.03 ng/mL    Comment: CRITICAL RESULT CALLED TO, READ BACK BY AND VERIFIED WITH: KENNEDYRRN [id number] MCCAULEG   Hemoglobin A1c     Status: Abnormal   Collection Time: 04/18/17  5:04 PM  Result Value Ref Range   Hgb A1c MFr Bld 5.7 (H) 4.8 - 5.6 %    Comment: (NOTE)         Prediabetes: 5.7 - 6.4         Diabetes: >6.4         Glycemic control for adults with diabetes: <7.0    Mean Plasma Glucose 117 mg/dL    Comment: (NOTE) Performed At: [facility] [address] [place], [place] [id number] Rush Farmer MD OY:[id number]   Glucose, capillary     Status: Abnormal   Collection Time: 04/18/17 10:20 PM  Result Value Ref Range   Glucose-Capillary 121 (H) 65 - 99 mg/dL  Troponin I     Status: Abnormal   Collection Time: 04/18/17 10:24 PM  Result Value Ref Range   Troponin I 0.06 (HH) <0.03 ng/mL    Comment: CRITICAL VALUE NOTED.  VALUE IS CONSISTENT WITH PREVIOUSLY REPORTED AND CALLED VALUE.  Troponin I     Status: Abnormal   Collection Time: 04/19/17  4:57 AM  Result Value Ref Range   Troponin I 0.07 (HH) <0.03 ng/mL    Comment: CRITICAL VALUE NOTED.  VALUE IS CONSISTENT WITH PREVIOUSLY REPORTED AND CALLED VALUE.  Basic metabolic panel     Status: Abnormal   Collection Time: 04/19/17  4:57 AM  Result Value Ref Range  Sodium 140 135 - 145 mmol/L   Potassium 3.8 3.5 - 5.1 mmol/L   Chloride 107 101 - 111 mmol/L   CO2 25 22 - 32 mmol/L   Glucose, Bld 107 (H) 65 - 99 mg/dL   BUN 25 (H) 6  - 20 mg/dL   Creatinine, Ser 1.13 (H) 0.44 - 1.00 mg/dL   Calcium 9.7 8.9 - 10.3 mg/dL   GFR calc non Af Amer 44 (L) >60 mL/min   GFR calc Af Amer 51 (L) >60 mL/min    Comment: (NOTE) The eGFR has been calculated using the CKD EPI equation. This calculation has not been validated in all clinical situations. eGFR's persistently <60 mL/min signify possible Chronic Kidney Disease.    Anion gap 8 5 - 15  Lipid panel     Status: Abnormal   Collection Time: 04/19/17  4:57 AM  Result Value Ref Range   Cholesterol 127 0 - 200 mg/dL   Triglycerides 91 <150 mg/dL   HDL 39 (L) >40 mg/dL   Total CHOL/HDL Ratio 3.3 RATIO   VLDL 18 0 - 40 mg/dL   LDL Cholesterol 70 0 - 99 mg/dL    Comment:        Total Cholesterol/HDL:CHD Risk Coronary Heart Disease Risk Table                     Men   Women  1/2 Average Risk   3.4   3.3  Average Risk       5.0   4.4  2 X Average Risk   9.6   7.1  3 X Average Risk  23.4   11.0        Use the calculated Patient Ratio above and the CHD Risk Table to determine the patient's CHD Risk.        ATP III CLASSIFICATION (LDL):  <100     mg/dL   Optimal  100-129  mg/dL   Near or Above                    Optimal  130-159  mg/dL   Borderline  160-189  mg/dL   High  >190     mg/dL   Very High   Glucose, capillary     Status: Abnormal   Collection Time: 04/19/17  7:27 AM  Result Value Ref Range   Glucose-Capillary 102 (H) 65 - 99 mg/dL  Glucose, capillary     Status: Abnormal   Collection Time: 04/19/17 11:00 AM  Result Value Ref Range   Glucose-Capillary 136 (H) 65 - 99 mg/dL  Urine Culture     Status: Abnormal   Collection Time: 04/19/17 11:07 AM  Result Value Ref Range   Specimen Description URINE, CLEAN CATCH    Special Requests NONE    Culture MULTIPLE SPECIES PRESENT, SUGGEST RECOLLECTION (A)    Report Status 04/20/2017 FINAL   Glucose, capillary     Status: Abnormal   Collection Time: 04/19/17  4:18 PM  Result Value Ref Range   Glucose-Capillary  102 (H) 65 - 99 mg/dL  Comprehensive metabolic panel     Status: Abnormal   Collection Time: 06/17/17  9:45 AM  Result Value Ref Range   Sodium 141 135 - 145 mmol/L   Potassium 4.1 3.5 - 5.1 mmol/L   Chloride 108 101 - 111 mmol/L   CO2 24 22 - 32 mmol/L   Glucose, Bld 94 65 - 99 mg/dL   BUN 20 6 -  20 mg/dL   Creatinine, Ser 1.07 (H) 0.44 - 1.00 mg/dL   Calcium 9.5 8.9 - 10.3 mg/dL   Total Protein 6.6 6.5 - 8.1 g/dL   Albumin 3.7 3.5 - 5.0 g/dL   AST 19 15 - 41 U/L   ALT 11 (L) 14 - 54 U/L   Alkaline Phosphatase 82 38 - 126 U/L   Total Bilirubin 0.7 0.3 - 1.2 mg/dL   GFR calc non Af Amer 47 (L) >60 mL/min   GFR calc Af Amer 54 (L) >60 mL/min    Comment: (NOTE) The eGFR has been calculated using the CKD EPI equation. This calculation has not been validated in all clinical situations. eGFR's persistently <60 mL/min signify possible Chronic Kidney Disease.    Anion gap 9 5 - 15  TSH     Status: Abnormal   Collection Time: 06/17/17  9:45 AM  Result Value Ref Range   TSH 6.301 (H) 0.350 - 4.500 uIU/mL    Comment: Performed by a 3rd Generation assay with a functional sensitivity of <=0.01 uIU/mL.  T4, free     Status: None   Collection Time: 06/17/17  9:45 AM  Result Value Ref Range   Free T4 0.70 0.61 - 1.12 ng/dL    Comment: (NOTE) Biotin ingestion may interfere with free T4 tests. If the results are inconsistent with the TSH level, previous test results, or the clinical presentation, then consider biotin interference. If needed, order repeat testing after stopping biotin.   T3, free     Status: Abnormal   Collection Time: 06/17/17  9:45 AM  Result Value Ref Range   T3, Free 1.9 (L) 2.0 - 4.4 pg/mL    Comment: (NOTE) Performed At: [facility] [address] [place], [place] [id number] Rush Farmer MD EX:[id number]     Objective: General: Patient is awake, alert, and oriented x 3 and in no acute distress.  Integument: Skin is warm, dry and supple  bilateral. Nails are elongated thickened and dystrophic with subungual debris, consistent with onychomycosis, 1-5 on right 1,2,4,5 on left. No signs of infection. + callus left sub-met 1and + ulceration sub met 1 on right that measures 0.1 x 0.1 x 0.1 cm smaller than previous with keratotic margin and granular base with no surrounding signs of infection, remaining integument unremarkable.  Vasculature:  Dorsalis Pedis pulse 1/4 bilateral. Posterior Tibial pulse  1/4 bilateral. Capillary fill time <3 sec 1-5 bilateral. Scant hair growth to the level of the digits.Temperature gradient within normal limits. Mild varicosities present bilateral. No edema present bilateral.   Neurology: The patient has absent sensation measured with a 5.07/10g Semmes Weinstein Monofilament at all pedal sites bilateral . Vibratory sensation absent bilateral with tuning fork. No Babinski sign present bilateral.   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 Asymptomatic bunion and hammertoe pedal deformities noted bilateral. Muscular strength 5/5 in all lower extremity muscular groups bilateral without pain on range of motion. No tenderness with calf compression bilateral.  Assessment and Plan: Problem List Items Addressed This Visit    None    Visit Diagnoses    Foot ulcer, limited to breakdown of skin, right (HCC)    -  Primary   Pre-ulcerative calluses       Onychomycosis       Type 2 diabetes, controlled, with neuropathy (HCC)       Hallux abductovalgus, acquired, unspecified laterality       Hammer toe, unspecified laterality          -  Examined patient. -Discussed and educated patient on diabetic foot care, especially with  regards to the vascular, neurological and musculoskeletal systems.  -Stressed the importance of good glycemic control and the detriment of not controlling glucose levels in relation to the foot. -Mechanically debrided all nails x9 using sterile nail nipper without incident and smooth  with rotary bur - Excisionally dedbrided ulceration sub met 1 on right to healthy bleeding borders removing nonviable and keratotic tissue using a sterile chisel blade. Wound measures post debridement as above. Wound was debrided to the level of the dermis with viable wound base exposed to promote healing. Hemostasis was achieved with manuel pressure. Patient tolerated procedure well without any discomfort or anesthesia necessary for this wound debridement.  -Applied antibiotic cream and bandaid and instructed patient to continue with daily dressings at home consisting of the same until completely healed - Advised patient to go to the ER or return to office if the wound worsens or if constitutional symptoms are present. -Answered all patient questions -Patient to return in 10 weeks for follow up diabetic care or sooner if wound does on heal up on right  -Patient advised to call the office if any problems or questions arise in the meantime.  Landis Martins, DPM

## 2017-07-04 DIAGNOSIS — R3915 Urgency of urination: Secondary | ICD-10-CM | POA: Diagnosis not present

## 2017-07-04 DIAGNOSIS — I4891 Unspecified atrial fibrillation: Secondary | ICD-10-CM | POA: Diagnosis not present

## 2017-07-04 DIAGNOSIS — R310 Gross hematuria: Secondary | ICD-10-CM | POA: Diagnosis not present

## 2017-07-04 DIAGNOSIS — R350 Frequency of micturition: Secondary | ICD-10-CM | POA: Diagnosis not present

## 2017-07-04 DIAGNOSIS — N939 Abnormal uterine and vaginal bleeding, unspecified: Secondary | ICD-10-CM | POA: Diagnosis not present

## 2017-07-05 DIAGNOSIS — N3281 Overactive bladder: Secondary | ICD-10-CM | POA: Diagnosis not present

## 2017-07-05 DIAGNOSIS — R310 Gross hematuria: Secondary | ICD-10-CM | POA: Diagnosis not present

## 2017-07-05 HISTORY — DX: Overactive bladder: N32.81

## 2017-07-05 HISTORY — DX: Gross hematuria: R31.0

## 2017-07-08 DIAGNOSIS — R319 Hematuria, unspecified: Secondary | ICD-10-CM | POA: Diagnosis not present

## 2017-07-08 DIAGNOSIS — R8290 Unspecified abnormal findings in urine: Secondary | ICD-10-CM | POA: Diagnosis not present

## 2017-07-08 DIAGNOSIS — N3941 Urge incontinence: Secondary | ICD-10-CM | POA: Diagnosis not present

## 2017-07-20 DIAGNOSIS — R319 Hematuria, unspecified: Secondary | ICD-10-CM | POA: Diagnosis not present

## 2017-07-20 DIAGNOSIS — N3281 Overactive bladder: Secondary | ICD-10-CM | POA: Diagnosis not present

## 2017-07-28 DIAGNOSIS — E119 Type 2 diabetes mellitus without complications: Secondary | ICD-10-CM | POA: Diagnosis not present

## 2017-07-28 DIAGNOSIS — H40013 Open angle with borderline findings, low risk, bilateral: Secondary | ICD-10-CM | POA: Diagnosis not present

## 2017-08-04 DIAGNOSIS — N390 Urinary tract infection, site not specified: Secondary | ICD-10-CM | POA: Diagnosis not present

## 2017-08-04 DIAGNOSIS — N3941 Urge incontinence: Secondary | ICD-10-CM | POA: Diagnosis not present

## 2017-08-04 DIAGNOSIS — R319 Hematuria, unspecified: Secondary | ICD-10-CM | POA: Diagnosis not present

## 2017-09-02 ENCOUNTER — Encounter (HOSPITAL_COMMUNITY): Payer: Self-pay | Admitting: Nurse Practitioner

## 2017-09-02 ENCOUNTER — Ambulatory Visit (HOSPITAL_COMMUNITY)
Admission: RE | Admit: 2017-09-02 | Discharge: 2017-09-02 | Disposition: A | Discharge status: Home / Self Care | Payer: Medicare PPO | Source: Ambulatory Visit | Attending: Nurse Practitioner | Admitting: Nurse Practitioner

## 2017-09-02 ENCOUNTER — Ambulatory Visit: Payer: PRIVATE HEALTH INSURANCE | Admitting: Sports Medicine

## 2017-09-02 VITALS — BP 176/86 | HR 60 | Ht 66.0 in | Wt 181.0 lb

## 2017-09-02 DIAGNOSIS — E119 Type 2 diabetes mellitus without complications: Secondary | ICD-10-CM | POA: Insufficient documentation

## 2017-09-02 DIAGNOSIS — Z79899 Other long term (current) drug therapy: Secondary | ICD-10-CM | POA: Insufficient documentation

## 2017-09-02 DIAGNOSIS — I10 Essential (primary) hypertension: Secondary | ICD-10-CM | POA: Diagnosis not present

## 2017-09-02 DIAGNOSIS — Z7901 Long term (current) use of anticoagulants: Secondary | ICD-10-CM | POA: Diagnosis not present

## 2017-09-02 DIAGNOSIS — E785 Hyperlipidemia, unspecified: Secondary | ICD-10-CM | POA: Insufficient documentation

## 2017-09-02 DIAGNOSIS — I480 Paroxysmal atrial fibrillation: Secondary | ICD-10-CM | POA: Diagnosis not present

## 2017-09-02 NOTE — Progress Notes (Signed)
Primary Care Physician: Nicoletta Dress, MD Referring Physician: [employer]  f/u   Maureen Stout is a 84 y.o. female with a h/o HTN, DM, that was recently hospitalized for new onset afib,04/18/17. She was grocery shopping and started feeling lightheaded and weak. She was taken by EMS to [facility] and admitted for RVR. She lives alone and is still very independent and drives where she needs to go. She converted in the hospital with Cardizem drip and went home in SR with a monitor in place. She did show an episode of afib while getting ready for bed with RVR on 12/16 that was not sustained and she was unaware with a v rate around 150 bpm. She is on BB that she has been on for some time with dose increased to 50 mg bid in hospital. Is on apixaban at 5 mg bid for a chadsvasc score of 5. No bleeding issues.  F/u in afib clinic 1/31. She feels well on amiodarone. No more episodes of heart racing/weakness. She is currently on antibiotic for UTI and received a Botox shot in the bladder 2 days ago for help with  frequent UTI's. Her BP is up today but states a long drive from [place]. In the PCP office, a few days ago, she had a BP of 128/76 per pt. She has also been taking her Eliquis wrong, just taking one a day, informed that she is not protected this way from stroke, has to take drug bid.  F/u 4/18, she feels well, no further presyncopal episodes, no awareness of heart racing. She needs to have a screening thyroid and liver  panel done but states she has f/u with PCP 4/30 and anticipates blood draw then. She asked to have her labs done there. I wrote a note to give to Dr. Delena Bali outlining the labs needed.   Today, she denies symptoms of palpitations, chest pain, shortness of breath, orthopnea, PND, lower extremity edema, dizziness, presyncope, syncope, or neurologic sequela. The patient is tolerating medications without difficulties and is otherwise without complaint today.   Past Medical History:  Diagnosis Date    . Bladder problem   . Diabetes mellitus without complication ([place])   . Dyslipidemia   . Hypertension   . Paroxysmal A-fib [facility])    Past Surgical History:  Procedure Laterality Date  . TOE SURGERY      Current Outpatient Medications  Medication Sig Dispense Refill  . allopurinol (ZYLOPRIM) 300 MG tablet Take 300 mg by mouth at bedtime.     . Alogliptin Benzoate (NESINA) 25 MG TABS Take 25 mg by mouth at bedtime.    Marland Kitchen amiodarone (PACERONE) 200 MG tablet Take 1 tablet (200 mg total) by mouth daily. 90 tablet 2  . atorvastatin (LIPITOR) 10 MG tablet Take 10 mg by mouth at bedtime.    . Calcium Carbonate-Vitamin D (CALCIUM-D PO) Take 1 tablet by mouth 2 (two) times daily.    Marland Kitchen gemfibrozil (LOPID) 600 MG tablet Take 600 mg by mouth 2 (two) times daily.     Marland Kitchen lisinopril (PRINIVIL,ZESTRIL) 20 MG tablet Take 20 mg by mouth daily.    . metoprolol tartrate (LOPRESSOR) 50 MG tablet Take 1 tablet (50 mg total) by mouth 2 (two) times daily. 60 tablet 2  . Multiple Vitamin (MULTI-VITAMINS) TABS Take by mouth.    . Multiple Vitamin (MULTIVITAMIN WITH MINERALS) TABS tablet Take 1 tablet by mouth daily. One A Day    . niacin 500 MG CR capsule Take 500  mg by mouth daily.    . Omega-3 Fatty Acids (FISH OIL) 1200 MG CAPS Take 1,200 mg by mouth 2 (two) times daily.     No current facility-administered medications for this encounter.     No Known Allergies  Social History   Socioeconomic History  . Marital status: Married    Spouse name: Not on file  . Number of children: Not on file  . Years of education: Not on file  . Highest education level: Not on file  Occupational History  . Not on file  Social Needs  . Financial resource strain: Not on file  . Food insecurity:    Worry: Not on file    Inability: Not on file  . Transportation needs:    Medical: Not on file    Non-medical: Not on file  Tobacco Use  . Smoking status: Never Smoker  . Smokeless tobacco: Never Used  Substance and  Sexual Activity  . Alcohol use: No  . Drug use: No  . Sexual activity: Not on file  Lifestyle  . Physical activity:    Days per week: Not on file    Minutes per session: Not on file  . Stress: Not on file  Relationships  . Social connections:    Talks on phone: Not on file    Gets together: Not on file    Attends religious service: Not on file    Active member of club or organization: Not on file    Attends meetings of clubs or organizations: Not on file    Relationship status: Not on file  . Intimate partner violence:    Fear of current or ex partner: Not on file    Emotionally abused: Not on file    Physically abused: Not on file    Forced sexual activity: Not on file  Other Topics Concern  . Not on file  Social History Narrative  . Not on file    No family history on file.  ROS- All systems are reviewed and negative except as per the HPI above  Physical Exam: Vitals:   09/02/17 1031  BP: (!) 176/86  Pulse: 60  Weight: 181 lb (82.1 kg)  Height: 5\' 6"  (1.676 m)   Wt Readings from Last 3 Encounters:  09/02/17 181 lb (82.1 kg)  06/17/17 186 lb (84.4 kg)  05/06/17 182 lb (82.6 kg)    Labs: Lab Results  Component Value Date   NA 141 06/17/2017   K 4.1 06/17/2017   CL 108 06/17/2017   CO2 24 06/17/2017   GLUCOSE 94 06/17/2017   BUN 20 06/17/2017   CREATININE 1.07 (H) 06/17/2017   CALCIUM 9.5 06/17/2017   MG 1.4 (L) 04/18/2017   No results found for: INR Lab Results  Component Value Date   CHOL 127 04/19/2017   HDL 39 (L) 04/19/2017   LDLCALC 70 04/19/2017   TRIG 91 04/19/2017     GEN- The patient is well appearing, alert and oriented x 3 today.   Head- normocephalic, atraumatic Eyes-  Sclera clear, conjunctiva pink Ears- hearing intact Oropharynx- clear Neck- supple, no JVP Lymph- no cervical lymphadenopathy Lungs- Clear to ausculation bilaterally, normal work of breathing Heart- regular rate and rhythm, no murmurs, rubs or gallops, PMI not  laterally displaced GI- soft, NT, ND, + BS Extremities- no clubbing, cyanosis, or edema MS- no significant deformity or atrophy Skin- no rash or lesion Psych- euthymic mood, full affect Neuro- strength and sensation are intact  EKG-SR  at 63 bpm, LAD, pr int 184 ms, qrs int 116 ms, qt int 458 ms Echo-- Left ventricle: The cavity size was normal. Wall thickness was   increased in a pattern of mild LVH. Systolic function was normal.   The estimated ejection fraction was in the range of 60% to 65%.   Wall motion was normal; there were no regional wall motion   abnormalities. Doppler parameters are consistent with abnormal   left ventricular relaxation (grade 1 diastolic dysfunction).    Assessment and Plan: 1. New onset symptomatic afib with RVR, 04/217 Maintaining  SR on amiodarone 200 mg a day Requested PCP draw a TSH and liver panel on 4/30 when pt has appointment, pt deferred labs here  2. Chadsvasc score of 5  Pt misunderstood and has been taking eliquis 5 mg just once daily Informed that she needs bid to fully  protect her against stroke and she voiced understanding   3. HTN Rechecked at 160/88 PT states that this is more her norm, sys 150-160 Followed  by PCP  F/u in 4 months  Butch Penny C. Trea Latner, [facility] [address] [place], [place] [address] [phone]

## 2017-09-08 ENCOUNTER — Ambulatory Visit (INDEPENDENT_AMBULATORY_CARE_PROVIDER_SITE_OTHER): Payer: Medicare PPO | Admitting: Sports Medicine

## 2017-09-08 ENCOUNTER — Encounter: Payer: Self-pay | Admitting: Sports Medicine

## 2017-09-08 DIAGNOSIS — E1140 Type 2 diabetes mellitus with diabetic neuropathy, unspecified: Secondary | ICD-10-CM

## 2017-09-08 DIAGNOSIS — L97511 Non-pressure chronic ulcer of other part of right foot limited to breakdown of skin: Secondary | ICD-10-CM | POA: Diagnosis not present

## 2017-09-08 DIAGNOSIS — M2010 Hallux valgus (acquired), unspecified foot: Secondary | ICD-10-CM

## 2017-09-08 DIAGNOSIS — B351 Tinea unguium: Secondary | ICD-10-CM

## 2017-09-08 DIAGNOSIS — L97521 Non-pressure chronic ulcer of other part of left foot limited to breakdown of skin: Secondary | ICD-10-CM | POA: Diagnosis not present

## 2017-09-08 NOTE — Progress Notes (Signed)
Subjective: Maureen Stout is a 84 y.o. female patient with history of diabetes who returns to office today for evaluation of right and left foot ulceration and for diabetic nail care. Reports no changes with medical history since last visit saw primary care Dr. Claudie Revering 3 months ago with last blood sugar recorded at 102.  Denies nausea, vomiting, fever, chills, excessive drainage, or increased pain.  Patient Active Problem List   Diagnosis Date Noted  . Pressure injury of skin 04/19/2017  . Atrial fibrillation with rapid ventricular response ([place]) 04/18/2017   Current Outpatient Medications on File Prior to Visit  Medication Sig Dispense Refill  . allopurinol (ZYLOPRIM) 300 MG tablet Take 300 mg by mouth at bedtime.     . Alogliptin Benzoate (NESINA) 25 MG TABS Take 25 mg by mouth at bedtime.    Marland Kitchen amiodarone (PACERONE) 200 MG tablet Take 1 tablet (200 mg total) by mouth daily. 90 tablet 2  . atorvastatin (LIPITOR) 10 MG tablet Take 10 mg by mouth at bedtime.    . Calcium Carbonate-Vitamin D (CALCIUM-D PO) Take 1 tablet by mouth 2 (two) times daily.    Marland Kitchen gemfibrozil (LOPID) 600 MG tablet Take 600 mg by mouth 2 (two) times daily.     Marland Kitchen lisinopril (PRINIVIL,ZESTRIL) 20 MG tablet Take 20 mg by mouth daily.    . metoprolol tartrate (LOPRESSOR) 50 MG tablet Take 1 tablet (50 mg total) by mouth 2 (two) times daily. 60 tablet 2  . Multiple Vitamin (MULTI-VITAMINS) TABS Take by mouth.    . Multiple Vitamin (MULTIVITAMIN WITH MINERALS) TABS tablet Take 1 tablet by mouth daily. One A Day    . niacin 500 MG CR capsule Take 500 mg by mouth daily.    . Omega-3 Fatty Acids (FISH OIL) 1200 MG CAPS Take 1,200 mg by mouth 2 (two) times daily.     No current facility-administered medications on file prior to visit.    No Known Allergies  Recent Results (from the past 2160 hour(s))  Comprehensive metabolic panel     Status: Abnormal   Collection Time: 06/17/17  9:45 AM  Result Value Ref Range   Sodium 141  135 - 145 mmol/L   Potassium 4.1 3.5 - 5.1 mmol/L   Chloride 108 101 - 111 mmol/L   CO2 24 22 - 32 mmol/L   Glucose, Bld 94 65 - 99 mg/dL   BUN 20 6 - 20 mg/dL   Creatinine, Ser 1.07 (H) 0.44 - 1.00 mg/dL   Calcium 9.5 8.9 - 10.3 mg/dL   Total Protein 6.6 6.5 - 8.1 g/dL   Albumin 3.7 3.5 - 5.0 g/dL   AST 19 15 - 41 U/L   ALT 11 (L) 14 - 54 U/L   Alkaline Phosphatase 82 38 - 126 U/L   Total Bilirubin 0.7 0.3 - 1.2 mg/dL   GFR calc non Af Amer 47 (L) >60 mL/min   GFR calc Af Amer 54 (L) >60 mL/min    Comment: (NOTE) The eGFR has been calculated using the CKD EPI equation. This calculation has not been validated in all clinical situations. eGFR's persistently <60 mL/min signify possible Chronic Kidney Disease.    Anion gap 9 5 - 15  TSH     Status: Abnormal   Collection Time: 06/17/17  9:45 AM  Result Value Ref Range   TSH 6.301 (H) 0.350 - 4.500 uIU/mL    Comment: Performed by a 3rd Generation assay with a functional sensitivity of <=0.01 uIU/mL.  T4, free     Status: None   Collection Time: 06/17/17  9:45 AM  Result Value Ref Range   Free T4 0.70 0.61 - 1.12 ng/dL    Comment: (NOTE) Biotin ingestion may interfere with free T4 tests. If the results are inconsistent with the TSH level, previous test results, or the clinical presentation, then consider biotin interference. If needed, order repeat testing after stopping biotin.   T3, free     Status: Abnormal   Collection Time: 06/17/17  9:45 AM  Result Value Ref Range   T3, Free 1.9 (L) 2.0 - 4.4 pg/mL    Comment: (NOTE) Performed At: [facility] [address] [place], [place] [id number] Rush Farmer MD YQ:[id number]     Objective: General: Patient is awake, alert, and oriented x 3 and in no acute distress.  Integument: Skin is warm, dry and supple bilateral. Nails are elongated thickened and dystrophic with subungual debris, consistent with onychomycosis, 1-5 on right 1,2,4,5 on left. No signs of  infection.  + ulceration sub met 1 on right and right that measures less than 0.5 cm with keratotic margin and granular base with no surrounding signs of infection, remaining integument unremarkable.  Vasculature:  Dorsalis Pedis pulse 1/4 bilateral. Posterior Tibial pulse  1/4 bilateral. Capillary fill time <3 sec 1-5 bilateral. Scant hair growth to the level of the digits.Temperature gradient within normal limits. Mild varicosities present bilateral. No edema present bilateral.   Neurology: The patient has absent sensation measured with a 5.07/10g Semmes Weinstein Monofilament at all pedal sites bilateral . Vibratory sensation absent bilateral with tuning fork. No Babinski sign present bilateral.   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 Asymptomatic bunion and hammertoe pedal deformities noted bilateral. Muscular strength 5/5 in all lower extremity muscular groups bilateral without pain on range of motion. No tenderness with calf compression bilateral.  Assessment and Plan: Problem List Items Addressed This Visit    None    Visit Diagnoses    Onychomycosis    -  Primary   Foot ulcer, limited to breakdown of skin, right (HCC)       Foot ulcer, left, limited to breakdown of skin (HCC)       Type 2 diabetes, controlled, with neuropathy (HCC)       Hallux abductovalgus, acquired, unspecified laterality         -Examined patient. -Discussed and educated patient on diabetic foot care, especially with  regards to the vascular, neurological and musculoskeletal systems.  -Stressed the importance of good glycemic control and the detriment of not controlling glucose levels in relation to the foot. -Mechanically debrided all nails x9 using sterile nail nipper without incident and smooth with rotary bur - Excisionally dedbrided ulceration sub met 1 on right and left to healthy bleeding borders removing nonviable and keratotic tissue using a sterile chisel blade. Wound measures post  debridement as above. Wound was debrided to the level of the dermis with viable wound base exposed to promote healing. Hemostasis was achieved with manuel pressure. Patient tolerated procedure well without any discomfort or anesthesia necessary for this wound debridement.  -Applied antibiotic cream and bandaid and instructed patient to continue with daily dressings at home consisting of the same until completely healed - Advised patient to go to the ER or return to office if the wound worsens or if constitutional symptoms are present. -Answered all patient questions -Patient to return in 10 weeks for follow up diabetic care  and wound check on right and  left foot -Patient advised to call the office if any problems or questions arise in the meantime.  Landis Martins, DPM

## 2017-09-14 DIAGNOSIS — D638 Anemia in other chronic diseases classified elsewhere: Secondary | ICD-10-CM | POA: Diagnosis not present

## 2017-09-14 DIAGNOSIS — Z6830 Body mass index (BMI) 30.0-30.9, adult: Secondary | ICD-10-CM | POA: Diagnosis not present

## 2017-09-14 DIAGNOSIS — M109 Gout, unspecified: Secondary | ICD-10-CM | POA: Diagnosis not present

## 2017-09-14 DIAGNOSIS — E1142 Type 2 diabetes mellitus with diabetic polyneuropathy: Secondary | ICD-10-CM | POA: Diagnosis not present

## 2017-09-14 DIAGNOSIS — Z79899 Other long term (current) drug therapy: Secondary | ICD-10-CM | POA: Diagnosis not present

## 2017-09-14 DIAGNOSIS — I129 Hypertensive chronic kidney disease with stage 1 through stage 4 chronic kidney disease, or unspecified chronic kidney disease: Secondary | ICD-10-CM | POA: Diagnosis not present

## 2017-09-14 DIAGNOSIS — E785 Hyperlipidemia, unspecified: Secondary | ICD-10-CM | POA: Diagnosis not present

## 2017-09-14 DIAGNOSIS — E1122 Type 2 diabetes mellitus with diabetic chronic kidney disease: Secondary | ICD-10-CM | POA: Diagnosis not present

## 2017-09-14 DIAGNOSIS — I482 Chronic atrial fibrillation: Secondary | ICD-10-CM | POA: Diagnosis not present

## 2017-10-05 DIAGNOSIS — R319 Hematuria, unspecified: Secondary | ICD-10-CM | POA: Diagnosis not present

## 2017-10-05 DIAGNOSIS — N3941 Urge incontinence: Secondary | ICD-10-CM | POA: Diagnosis not present

## 2017-10-05 DIAGNOSIS — N390 Urinary tract infection, site not specified: Secondary | ICD-10-CM | POA: Diagnosis not present

## 2017-11-11 ENCOUNTER — Ambulatory Visit (INDEPENDENT_AMBULATORY_CARE_PROVIDER_SITE_OTHER): Payer: 59 | Admitting: Sports Medicine

## 2017-11-11 ENCOUNTER — Encounter: Payer: Self-pay | Admitting: Sports Medicine

## 2017-11-11 VITALS — BP 132/55 | HR 54 | Temp 98.4°F | Resp 16

## 2017-11-11 DIAGNOSIS — E1140 Type 2 diabetes mellitus with diabetic neuropathy, unspecified: Secondary | ICD-10-CM

## 2017-11-11 DIAGNOSIS — L97521 Non-pressure chronic ulcer of other part of left foot limited to breakdown of skin: Secondary | ICD-10-CM

## 2017-11-11 DIAGNOSIS — B351 Tinea unguium: Secondary | ICD-10-CM

## 2017-11-11 DIAGNOSIS — L97511 Non-pressure chronic ulcer of other part of right foot limited to breakdown of skin: Secondary | ICD-10-CM

## 2017-11-11 MED ORDER — SILVER SULFADIAZINE 1 % EX CREA: 1.0000 "application " | TOPICAL_CREAM | Freq: Every day | CUTANEOUS | 0 refills | Status: DC

## 2017-11-11 NOTE — Progress Notes (Signed)
Subjective: Maureen Stout is a 84 y.o. female patient with history of diabetes who returns to office today for evaluation of right and left foot ulceration and for diabetic nail care. Reports no changes with medical history since last visit saw primary care Dr. Claudie Revering 3 months ago with last blood sugar recorded at 106.  Denies nausea, vomiting, fever, chills, excessive drainage, or increased pain. Reports that she has been keeping socks on when in house.   Patient Active Problem List   Diagnosis Date Noted  . Pressure injury of skin 04/19/2017  . Atrial fibrillation with rapid ventricular response ([place]) 04/18/2017   Current Outpatient Medications on File Prior to Visit  Medication Sig Dispense Refill  . allopurinol (ZYLOPRIM) 300 MG tablet Take 300 mg by mouth at bedtime.     Marland Kitchen amiodarone (PACERONE) 200 MG tablet Take 1 tablet (200 mg total) by mouth daily. 90 tablet 2  . atorvastatin (LIPITOR) 10 MG tablet Take 10 mg by mouth at bedtime.    . Calcium Carbonate-Vitamin D (CALCIUM-D PO) Take 1 tablet by mouth 2 (two) times daily.    Marland Kitchen gemfibrozil (LOPID) 600 MG tablet Take 600 mg by mouth 2 (two) times daily.     Marland Kitchen lisinopril (PRINIVIL,ZESTRIL) 20 MG tablet Take 20 mg by mouth daily.    . metoprolol tartrate (LOPRESSOR) 50 MG tablet Take 1 tablet (50 mg total) by mouth 2 (two) times daily. 60 tablet 2  . Multiple Vitamin (MULTI-VITAMINS) TABS Take by mouth.    . Multiple Vitamin (MULTIVITAMIN WITH MINERALS) TABS tablet Take 1 tablet by mouth daily. One A Day    . niacin 500 MG CR capsule Take 500 mg by mouth daily.    . Omega-3 Fatty Acids (FISH OIL) 1200 MG CAPS Take 1,200 mg by mouth 2 (two) times daily.     No current facility-administered medications on file prior to visit.    No Known Allergies  No results found for this or any previous visit (from the past 2160 hour(s)).  Objective: General: Patient is awake, alert, and oriented x 3 and in no acute distress.  Integument: Skin is  warm, dry and supple bilateral. Nails are elongated thickened and dystrophic with subungual debris, consistent with onychomycosis, 1-5 on right 1,2,4,5 on left. No signs of infection.  + ulceration sub met 1 on right and left that measures less than 1 cm on left and less than 0.5cm on right with keratotic margin and granular base with no surrounding signs of infection, remaining integument unremarkable.  Vasculature:  Dorsalis Pedis pulse 1/4 bilateral. Posterior Tibial pulse  1/4 bilateral. Capillary fill time <3 sec 1-5 bilateral. Scant hair growth to the level of the digits.Temperature gradient within normal limits. Mild varicosities present bilateral. No edema present bilateral.   Neurology: The patient has absent sensation measured with a 5.07/10g Semmes Weinstein Monofilament at all pedal sites bilateral . Vibratory sensation absent bilateral with tuning fork. No Babinski sign present bilateral.   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 Asymptomatic bunion and hammertoe pedal deformities noted bilateral. Muscular strength 5/5 in all lower extremity muscular groups bilateral without pain on range of motion. No tenderness with calf compression bilateral.  Assessment and Plan: Problem List Items Addressed This Visit    None    Visit Diagnoses    Onychomycosis    -  Primary   Type 2 diabetes, controlled, with neuropathy ([place])       Foot ulcer, limited to breakdown of skin, right ([place])  Foot ulcer, left, limited to breakdown of skin ([place])          -Examined patient. -Discussed and educated patient on diabetic foot care, especially with  regards to the vascular, neurological and musculoskeletal systems.  -Stressed the importance of good glycemic control and the detriment of not controlling glucose levels in relation to the foot. -Mechanically debrided all nails x9 using sterile nail nipper without incident and smooth with rotary bur - Excisionally dedbrided ulceration sub met  1 on right and left to healthy bleeding borders removing nonviable and keratotic tissue using a sterile chisel blade. Wound measures post debridement as above. Wound was debrided to the level of the dermis with viable wound base exposed to promote healing. Hemostasis was achieved with manuel pressure. Patient tolerated procedure well without any discomfort or anesthesia necessary for this wound debridement.  -Applied silvadene cream and bandaid and instructed patient to continue with daily dressings at home consisting of the same until completely healed. Advised patient that she must be compliant with dressing the areas and using offloading padding. Advised patient to refrain from barefoot walking and that she should consider diabetic shoes or which she does not want to try  - Advised patient to go to the ER or return to office if the wound worsens or if constitutional symptoms are present. -Answered all patient questions -Patient to return in 10-12 weeks for follow up diabetic care  and wound check on right and left foot -Patient advised to call the office if any problems or questions arise in the meantime.  Landis Martins, DPM

## 2017-11-16 DIAGNOSIS — L578 Other skin changes due to chronic exposure to nonionizing radiation: Secondary | ICD-10-CM | POA: Diagnosis not present

## 2017-11-16 DIAGNOSIS — L821 Other seborrheic keratosis: Secondary | ICD-10-CM | POA: Diagnosis not present

## 2017-11-16 DIAGNOSIS — C44321 Squamous cell carcinoma of skin of nose: Secondary | ICD-10-CM | POA: Diagnosis not present

## 2017-11-16 DIAGNOSIS — L570 Actinic keratosis: Secondary | ICD-10-CM | POA: Diagnosis not present

## 2017-11-24 DIAGNOSIS — N3941 Urge incontinence: Secondary | ICD-10-CM | POA: Diagnosis not present

## 2017-11-24 DIAGNOSIS — N390 Urinary tract infection, site not specified: Secondary | ICD-10-CM | POA: Diagnosis not present

## 2017-11-24 DIAGNOSIS — R319 Hematuria, unspecified: Secondary | ICD-10-CM | POA: Diagnosis not present

## 2017-11-29 DIAGNOSIS — E785 Hyperlipidemia, unspecified: Secondary | ICD-10-CM | POA: Diagnosis not present

## 2017-11-29 DIAGNOSIS — Z1331 Encounter for screening for depression: Secondary | ICD-10-CM | POA: Diagnosis not present

## 2017-11-29 DIAGNOSIS — Z6830 Body mass index (BMI) 30.0-30.9, adult: Secondary | ICD-10-CM | POA: Diagnosis not present

## 2017-11-29 DIAGNOSIS — Z0000 Encounter for general adult medical examination without abnormal findings: Secondary | ICD-10-CM | POA: Diagnosis not present

## 2017-11-29 DIAGNOSIS — E669 Obesity, unspecified: Secondary | ICD-10-CM | POA: Diagnosis not present

## 2017-11-29 DIAGNOSIS — Z1339 Encounter for screening examination for other mental health and behavioral disorders: Secondary | ICD-10-CM | POA: Diagnosis not present

## 2017-11-29 DIAGNOSIS — Z1231 Encounter for screening mammogram for malignant neoplasm of breast: Secondary | ICD-10-CM | POA: Diagnosis not present

## 2017-11-29 DIAGNOSIS — Z9181 History of falling: Secondary | ICD-10-CM | POA: Diagnosis not present

## 2017-11-29 DIAGNOSIS — Z136 Encounter for screening for cardiovascular disorders: Secondary | ICD-10-CM | POA: Diagnosis not present

## 2017-12-07 DIAGNOSIS — C44321 Squamous cell carcinoma of skin of nose: Secondary | ICD-10-CM | POA: Diagnosis not present

## 2017-12-15 DIAGNOSIS — E785 Hyperlipidemia, unspecified: Secondary | ICD-10-CM | POA: Diagnosis not present

## 2017-12-15 DIAGNOSIS — Z79899 Other long term (current) drug therapy: Secondary | ICD-10-CM | POA: Diagnosis not present

## 2017-12-15 DIAGNOSIS — I482 Chronic atrial fibrillation: Secondary | ICD-10-CM | POA: Diagnosis not present

## 2017-12-15 DIAGNOSIS — E1122 Type 2 diabetes mellitus with diabetic chronic kidney disease: Secondary | ICD-10-CM | POA: Diagnosis not present

## 2017-12-15 DIAGNOSIS — E1142 Type 2 diabetes mellitus with diabetic polyneuropathy: Secondary | ICD-10-CM | POA: Diagnosis not present

## 2017-12-15 DIAGNOSIS — M109 Gout, unspecified: Secondary | ICD-10-CM | POA: Diagnosis not present

## 2017-12-15 DIAGNOSIS — Z1339 Encounter for screening examination for other mental health and behavioral disorders: Secondary | ICD-10-CM | POA: Diagnosis not present

## 2017-12-15 DIAGNOSIS — D638 Anemia in other chronic diseases classified elsewhere: Secondary | ICD-10-CM | POA: Diagnosis not present

## 2017-12-15 DIAGNOSIS — I129 Hypertensive chronic kidney disease with stage 1 through stage 4 chronic kidney disease, or unspecified chronic kidney disease: Secondary | ICD-10-CM | POA: Diagnosis not present

## 2017-12-31 DIAGNOSIS — M109 Gout, unspecified: Secondary | ICD-10-CM | POA: Diagnosis not present

## 2017-12-31 DIAGNOSIS — E785 Hyperlipidemia, unspecified: Secondary | ICD-10-CM | POA: Diagnosis not present

## 2017-12-31 DIAGNOSIS — E119 Type 2 diabetes mellitus without complications: Secondary | ICD-10-CM | POA: Diagnosis not present

## 2017-12-31 DIAGNOSIS — Z6829 Body mass index (BMI) 29.0-29.9, adult: Secondary | ICD-10-CM | POA: Diagnosis not present

## 2017-12-31 DIAGNOSIS — E663 Overweight: Secondary | ICD-10-CM | POA: Diagnosis not present

## 2017-12-31 DIAGNOSIS — I10 Essential (primary) hypertension: Secondary | ICD-10-CM | POA: Diagnosis not present

## 2017-12-31 DIAGNOSIS — M1990 Unspecified osteoarthritis, unspecified site: Secondary | ICD-10-CM | POA: Diagnosis not present

## 2017-12-31 DIAGNOSIS — I4891 Unspecified atrial fibrillation: Secondary | ICD-10-CM | POA: Diagnosis not present

## 2017-12-31 DIAGNOSIS — R32 Unspecified urinary incontinence: Secondary | ICD-10-CM | POA: Diagnosis not present

## 2018-01-06 ENCOUNTER — Ambulatory Visit (HOSPITAL_COMMUNITY): Payer: Medicare PPO | Admitting: Nurse Practitioner

## 2018-01-06 DIAGNOSIS — N3941 Urge incontinence: Secondary | ICD-10-CM | POA: Diagnosis not present

## 2018-01-06 DIAGNOSIS — R319 Hematuria, unspecified: Secondary | ICD-10-CM | POA: Diagnosis not present

## 2018-01-06 DIAGNOSIS — N390 Urinary tract infection, site not specified: Secondary | ICD-10-CM | POA: Diagnosis not present

## 2018-01-08 DIAGNOSIS — I119 Hypertensive heart disease without heart failure: Secondary | ICD-10-CM | POA: Insufficient documentation

## 2018-01-08 DIAGNOSIS — Z79899 Other long term (current) drug therapy: Secondary | ICD-10-CM | POA: Insufficient documentation

## 2018-01-08 DIAGNOSIS — R7989 Other specified abnormal findings of blood chemistry: Secondary | ICD-10-CM

## 2018-01-08 DIAGNOSIS — Z7901 Long term (current) use of anticoagulants: Secondary | ICD-10-CM

## 2018-01-08 HISTORY — DX: Other long term (current) drug therapy: Z79.899

## 2018-01-08 HISTORY — DX: Long term (current) use of anticoagulants: Z79.01

## 2018-01-08 HISTORY — DX: Hypertensive heart disease without heart failure: I11.9

## 2018-01-08 NOTE — Progress Notes (Signed)
Cardiology Office Note:    Date:  01/10/2018   ID:  Maureen Stout, DOB 1931-12-18, MRN [id number]  PCP:  Nicoletta Dress, MD  Cardiologist:  Shirlee More, MD    Referring MD: Nicoletta Dress, MD    ASSESSMENT:    1. Paroxysmal atrial fibrillation (HCC)   2. On amiodarone therapy   3. Chronic anticoagulation   4. Hypertensive heart disease without heart failure   5. Elevated TSH   6. Sinus bradycardia    PLAN:    In order of problems listed above:  1. Stable no clinical recurrence she will continue low-dose amiodarone but I am concerned with her sinus bradycardia decrease her beta-blocker by 50% check office EKG in 2 weeks and if rate remains less than 50-55 stop her beta-blocker.  We had a nice discussion about anticoagulant therapy stroke risk and in view of her age and elevated creatinine we will place her back on low-dose anticoagulant Eliquis now that her hematuria is cleared. 2. Stable continue low-dose amiodarone labs requested from her PCP office to be sure that liver function thyroid is checked every 6 months 3. Anticoagulant was stopped when she had hematuria I asked her to resume low-dose Eliquis 4. Stable blood pressure target continue treatment low-dose beta-blocker.  If withdrawal she may require another agent like an ARB 5. Await recent labs   Next appointment: 6 months   Medication Adjustments/Labs and Tests Ordered: Current medicines are reviewed at length with the patient today.  Concerns regarding medicines are outlined above.  Orders Placed This Encounter  Procedures  . EKG 12-Lead   Meds ordered this encounter  Medications  . metoprolol tartrate (LOPRESSOR) 25 MG tablet    Sig: Take 1 tablet (25 mg total) by mouth 2 (two) times daily.    Dispense:  180 tablet    Refill:  3  . apixaban (ELIQUIS) 2.5 MG TABS tablet    Sig: Take 1 tablet (2.5 mg total) by mouth 2 (two) times daily.    Dispense:  90 tablet    Refill:  3    Chief Complaint    Patient presents with  . Follow-up  . Atrial Fibrillation    History of Present Illness:    Maureen Stout is a 84 y.o. female with a hx of atrial fibrillation hypertension dyslipidemia and chronic anticoagulation last seen by me at [facility] 09/10/2015. She has had recurrent atrial fibrillation and was placed on amiodarone January 2019 and seen in atrial fibrillation clinic 06/17/2017 in sinus rhythm.  She has a moderate stroke risk with a chads vascular score of 5 and takes anticoagulant with Eliquis.   Compliance with diet, lifestyle and medications: yes Past Medical History:  Diagnosis Date  . Arrhythmia   . Bladder problem   . Diabetes mellitus without complication ([place])   . Dyslipidemia   . Hypertension   . Paroxysmal A-fib [facility])     Past Surgical History:  Procedure Laterality Date  . PARTIAL HYSTERECTOMY    . TOE SURGERY    . TONSILLECTOMY      Current Medications: Current Meds  Medication Sig  . allopurinol (ZYLOPRIM) 300 MG tablet Take 300 mg by mouth at bedtime.   Marland Kitchen amiodarone (PACERONE) 200 MG tablet Take 1 tablet (200 mg total) by mouth daily.  . Calcium Carbonate-Vitamin D (CALCIUM-D PO) Take 1 tablet by mouth 2 (two) times daily.  . Multiple Vitamin (MULTI-VITAMINS) TABS Take 1 tablet by mouth daily.   Marland Kitchen  niacin 500 MG CR capsule Take 500 mg by mouth daily.  . Omega-3 Fatty Acids (FISH OIL) 1200 MG CAPS Take 1,200 mg by mouth 2 (two) times daily.  . silver sulfADIAZINE (SILVADENE) 1 % cream Apply 1 application topically daily. To foot ulcers  . sitaGLIPtin (JANUVIA) 100 MG tablet Take 50 mg by mouth daily.  . [DISCONTINUED] metoprolol tartrate (LOPRESSOR) 50 MG tablet Take 1 tablet (50 mg total) by mouth 2 (two) times daily.     Allergies:   Vancomycin   Social History   Socioeconomic History  . Marital status: Married    Spouse name: Not on file  . Number of children: Not on file  . Years of education: Not on file  . Highest education  level: Not on file  Occupational History  . Not on file  Social Needs  . Financial resource strain: Not on file  . Food insecurity:    Worry: Not on file    Inability: Not on file  . Transportation needs:    Medical: Not on file    Non-medical: Not on file  Tobacco Use  . Smoking status: Never Smoker  . Smokeless tobacco: Never Used  Substance and Sexual Activity  . Alcohol use: No  . Drug use: No  . Sexual activity: Not on file  Lifestyle  . Physical activity:    Days per week: Not on file    Minutes per session: Not on file  . Stress: Not on file  Relationships  . Social connections:    Talks on phone: Not on file    Gets together: Not on file    Attends religious service: Not on file    Active member of club or organization: Not on file    Attends meetings of clubs or organizations: Not on file    Relationship status: Not on file  Other Topics Concern  . Not on file  Social History Narrative  . Not on file     Family History: The patient's family history includes Breast cancer in her sister; Cancer in her father. There is no history of Diabetes or Heart attack. ROS:   Please see the history of present illness.    All other systems reviewed and are negative.  EKGs/Labs/Other Studies Reviewed:    The following studies were reviewed today: Baseline chest x-ray 04/18/2017 showed mild basilar atelectasis  EKG:  EKG ordered today.  The ekg ordered today demonstrates Sinus bradycardia 46 BPm poor r wave progression Echocardiogram done in January showed mild left ventricular hypertrophy normal left ventricular systolic function otherwise unremarkable Recent Labs: January 2019 liver function normal creatinine normal on CMP 04/18/2017: Hemoglobin 11.8; Magnesium 1.4; Platelets 194 06/17/2017: ALT 11; BUN 20; Creatinine, Ser 1.07; Potassium 4.1; Sodium 141; TSH 6.301  Recent Lipid Panel    Component Value Date/Time   CHOL 127 04/19/2017 0457   TRIG 91 04/19/2017 0457    HDL 39 (L) 04/19/2017 0457   CHOLHDL 3.3 04/19/2017 0457   VLDL 18 04/19/2017 0457   LDLCALC 70 04/19/2017 0457    Physical Exam:    VS:  BP 118/74 (BP Location: Right Arm, Patient Position: Sitting, Cuff Size: Normal)   Pulse (!) 46   Ht 5\' 6"  (1.676 m)   Wt 177 lb 9.6 oz (80.6 kg)   SpO2 98%   BMI 28.67 kg/m     Wt Readings from Last 3 Encounters:  01/10/18 177 lb 9.6 oz (80.6 kg)  09/02/17 181 lb (82.1 kg)  06/17/17 186 lb (84.4 kg)     GEN:  Well nourished, well developed in no acute distress HEENT: Normal NECK: No JVD; No carotid bruits LYMPHATICS: No lymphadenopathy CARDIAC: soft S1RRR, no murmurs, rubs, gallops RESPIRATORY:  Clear to auscultation without rales, wheezing or rhonchi  ABDOMEN: Soft, non-tender, non-distended MUSCULOSKELETAL:  No edema; No deformity  SKIN: Warm and dry NEUROLOGIC:  Alert and oriented x 3 PSYCHIATRIC:  Normal affect    Signed,  Shirlee More, MD  01/10/2018 11:40 AM    [place]

## 2018-01-10 ENCOUNTER — Encounter: Payer: Self-pay | Admitting: Emergency Medicine

## 2018-01-10 ENCOUNTER — Encounter: Payer: Self-pay | Admitting: Cardiology

## 2018-01-10 ENCOUNTER — Ambulatory Visit (INDEPENDENT_AMBULATORY_CARE_PROVIDER_SITE_OTHER): Payer: Medicare PPO | Admitting: Cardiology

## 2018-01-10 VITALS — BP 118/74 | HR 46 | Ht 66.0 in | Wt 177.6 lb

## 2018-01-10 DIAGNOSIS — Z79899 Other long term (current) drug therapy: Secondary | ICD-10-CM | POA: Diagnosis not present

## 2018-01-10 DIAGNOSIS — I119 Hypertensive heart disease without heart failure: Secondary | ICD-10-CM

## 2018-01-10 DIAGNOSIS — R7989 Other specified abnormal findings of blood chemistry: Secondary | ICD-10-CM | POA: Diagnosis not present

## 2018-01-10 DIAGNOSIS — I480 Paroxysmal atrial fibrillation: Secondary | ICD-10-CM

## 2018-01-10 DIAGNOSIS — Z7901 Long term (current) use of anticoagulants: Secondary | ICD-10-CM | POA: Diagnosis not present

## 2018-01-10 DIAGNOSIS — R001 Bradycardia, unspecified: Secondary | ICD-10-CM

## 2018-01-10 MED ORDER — METOPROLOL TARTRATE 25 MG PO TABS: 25.0000 mg | ORAL_TABLET | Freq: Two times a day (BID) | ORAL | 3 refills | Status: DC

## 2018-01-10 MED ORDER — APIXABAN 2.5 MG PO TABS: 2.5000 mg | ORAL_TABLET | Freq: Two times a day (BID) | ORAL | 3 refills | Status: DC

## 2018-01-10 NOTE — Patient Instructions (Addendum)
Medication Instructions:  Your physician has recommended you make the following change in your medication:  Decrease: Metoprolol to 25 mg twice daily.  Start: Eliquis 2.5 mg twice daily.  Labwork: None.  Testing/Procedures: None.  Follow-Up: Your physician recommends that you schedule a follow-up appointment in: 3 months.   Any Other Special Instructions Will Be Listed Below (If Applicable). Please come back in 2 weeks for ekg.     If you need a refill on your cardiac medications before your next appointment, please call your pharmacy.    Atrial Fibrillation Atrial fibrillation is a type of heartbeat that is irregular or fast (rapid). If you have this condition, your heart keeps quivering in a weird (chaotic) way. This condition can make it so your heart cannot pump blood normally. Having this condition gives a person more risk for stroke, heart failure, and other heart problems. There are different types of atrial fibrillation. Talk with your doctor to learn about the type that you have. Follow these instructions at home:  Take over-the-counter and prescription medicines only as told by your doctor.  If your doctor prescribed a blood-thinning medicine, take it exactly as told. Taking too much of it can cause bleeding. If you do not take enough of it, you will not have the protection that you need against stroke and other problems.  Do not use any tobacco products. These include cigarettes, chewing tobacco, and e-cigarettes. If you need help quitting, ask your doctor.  If you have apnea (obstructive sleep apnea), manage it as told by your doctor.  Do not drink alcohol.  Do not drink beverages that have caffeine. These include coffee, soda, and tea.  Maintain a healthy weight. Do not use diet pills unless your doctor says they are safe for you. Diet pills may make heart problems worse.  Follow diet instructions as told by your doctor.  Exercise regularly as told by your  doctor.  Keep all follow-up visits as told by your doctor. This is important. Contact a doctor if:  You notice a change in the speed, rhythm, or strength of your heartbeat.  You are taking a blood-thinning medicine and you notice more bruising.  You get tired more easily when you move or exercise. Get help right away if:  You have pain in your chest or your belly (abdomen).  You have sweating or weakness.  You feel sick to your stomach (nauseous).  You notice blood in your throw up (vomit), poop (stool), or pee (urine).  You are short of breath.  You suddenly have swollen feet and ankles.  You feel dizzy.  Your suddenly get weak or numb in your face, arms, or legs, especially if it happens on one side of your body.  You have trouble talking, trouble understanding, or both.  Your face or your eyelid droops on one side. These symptoms may be an emergency. Do not wait to see if the symptoms will go away. Get medical help right away. Call your local emergency services (911 in the [place] Do not drive yourself to the hospital. This information is not intended to replace advice given to you by your health care provider. Make sure you discuss any questions you have with your health care provider. Document Released: 02/11/2008 Document Revised: 10/10/2015 Document Reviewed: 08/29/2014  Apixaban oral tablets What is this medicine? APIXABAN (a PIX a ban) is an anticoagulant (blood thinner). It is used to lower the chance of stroke in people with a medical condition called atrial fibrillation.  It is also used to treat or prevent blood clots in the lungs or in the veins. This medicine may be used for other purposes; ask your health care provider or pharmacist if you have questions. COMMON BRAND NAME(S): Eliquis What should I tell my health care provider before I take this medicine? They need to know if you have any of these conditions: -bleeding disorders -bleeding in the brain -blood  in your stools (black or tarry stools) or if you have blood in your vomit -history of stomach bleeding -kidney disease -liver disease -mechanical heart valve -an unusual or allergic reaction to apixaban, other medicines, foods, dyes, or preservatives -pregnant or trying to get pregnant -breast-feeding How should I use this medicine? Take this medicine by mouth with a glass of water. Follow the directions on the prescription label. You can take it with or without food. If it upsets your stomach, take it with food. Take your medicine at regular intervals. Do not take it more often than directed. Do not stop taking except on your doctor's advice. Stopping this medicine may increase your risk of a blot clot. Be sure to refill your prescription before you run out of medicine. Talk to your pediatrician regarding the use of this medicine in children. Special care may be needed. Overdosage: If you think you have taken too much of this medicine contact a poison control center or emergency room at once. NOTE: This medicine is only for you. Do not share this medicine with others. What if I miss a dose? If you miss a dose, take it as soon as you can. If it is almost time for your next dose, take only that dose. Do not take double or extra doses. What may interact with this medicine? This medicine may interact with the following: -aspirin and aspirin-like medicines -certain medicines for fungal infections like ketoconazole and itraconazole -certain medicines for seizures like carbamazepine and phenytoin -certain medicines that treat or prevent blood clots like warfarin, enoxaparin, and dalteparin -clarithromycin -NSAIDs, medicines for pain and inflammation, like ibuprofen or naproxen -rifampin -ritonavir -St. John's wort This list may not describe all possible interactions. Give your health care provider a list of all the medicines, herbs, non-prescription drugs, or dietary supplements you use. Also  tell them if you smoke, drink alcohol, or use illegal drugs. Some items may interact with your medicine. What should I watch for while using this medicine? Visit your doctor or health care professional for regular checks on your progress. Notify your doctor or health care professional and seek emergency treatment if you develop breathing problems; changes in vision; chest pain; severe, sudden headache; pain, swelling, warmth in the leg; trouble speaking; sudden numbness or weakness of the face, arm or leg. These can be signs that your condition has gotten worse. If you are going to have surgery or other procedure, tell your doctor that you are taking this medicine. What side effects may I notice from receiving this medicine? Side effects that you should report to your doctor or health care professional as soon as possible: -allergic reactions like skin rash, itching or hives, swelling of the face, lips, or tongue -signs and symptoms of bleeding such as bloody or black, tarry stools; red or dark-brown urine; spitting up blood or brown material that looks like coffee grounds; red spots on the skin; unusual bruising or bleeding from the eye, gums, or nose This list may not describe all possible side effects. Call your doctor for medical advice about side effects.  You may report side effects to FDA at 1-800-FDA-1088. Where should I keep my medicine? Keep out of the reach of children. Store at room temperature between 20 and 25 degrees C (68 and 77 degrees F). Throw away any unused medicine after the expiration date. NOTE: This sheet is a summary. It may not cover all possible information. If you have questions about this medicine, talk to your doctor, pharmacist, or health care provider.  2018 Elsevier/Gold Standard (2015-11-25 11:54:23)  Elsevier Interactive Patient Education  [employer].

## 2018-01-20 DIAGNOSIS — H40013 Open angle with borderline findings, low risk, bilateral: Secondary | ICD-10-CM | POA: Diagnosis not present

## 2018-01-24 ENCOUNTER — Ambulatory Visit (INDEPENDENT_AMBULATORY_CARE_PROVIDER_SITE_OTHER): Payer: Medicare PPO | Admitting: Cardiology

## 2018-01-24 VITALS — HR 57

## 2018-01-24 DIAGNOSIS — I480 Paroxysmal atrial fibrillation: Secondary | ICD-10-CM

## 2018-01-24 NOTE — Patient Instructions (Signed)
Medication Instructions:  Your physician recommends that you continue on your current medications as directed. Please refer to the Current Medication list given to you today.   Labwork: None  Testing/Procedures: None  Follow-Up: Your physician recommends that you schedule a follow-up appointment in: keep current appointment.   Any Other Special Instructions Will Be Listed Below (If Applicable).     If you need a refill on your cardiac medications before your next appointment, please call your pharmacy.

## 2018-01-24 NOTE — Progress Notes (Signed)
Patient presented today for a EKG due to metoprolol therapy,after Dr. Bettina Gavia findings per Dr. Bettina Gavia would like to keep current medication therapy and for patient to keep current follow up appointment

## 2018-02-07 ENCOUNTER — Telehealth: Payer: Self-pay | Admitting: Cardiology

## 2018-02-07 ENCOUNTER — Other Ambulatory Visit: Payer: Self-pay

## 2018-02-07 MED ORDER — APIXABAN 2.5 MG PO TABS: 2.5000 mg | ORAL_TABLET | Freq: Two times a day (BID) | ORAL | 3 refills | Status: DC

## 2018-02-07 NOTE — Telephone Encounter (Signed)
° ° ° °  1. Which medications need to be refilled? (please list name of each medication and dose if known)Eloquis  2. Which pharmacy/location (including street and city if local pharmacy) is medication to be sent to?[facility]  3. Do they need a 30 day or 90 day supply? [place]

## 2018-02-07 NOTE — Telephone Encounter (Signed)
Refill has been sent.  °

## 2018-02-10 ENCOUNTER — Encounter: Payer: Self-pay | Admitting: Sports Medicine

## 2018-02-10 ENCOUNTER — Ambulatory Visit (INDEPENDENT_AMBULATORY_CARE_PROVIDER_SITE_OTHER): Payer: Medicare PPO | Admitting: Sports Medicine

## 2018-02-10 VITALS — BP 162/73 | HR 59 | Temp 97.8°F | Resp 19

## 2018-02-10 DIAGNOSIS — B351 Tinea unguium: Secondary | ICD-10-CM | POA: Diagnosis not present

## 2018-02-10 DIAGNOSIS — L97521 Non-pressure chronic ulcer of other part of left foot limited to breakdown of skin: Secondary | ICD-10-CM | POA: Diagnosis not present

## 2018-02-10 DIAGNOSIS — E1140 Type 2 diabetes mellitus with diabetic neuropathy, unspecified: Secondary | ICD-10-CM | POA: Diagnosis not present

## 2018-02-10 DIAGNOSIS — M2010 Hallux valgus (acquired), unspecified foot: Secondary | ICD-10-CM

## 2018-02-10 DIAGNOSIS — L97511 Non-pressure chronic ulcer of other part of right foot limited to breakdown of skin: Secondary | ICD-10-CM

## 2018-02-10 DIAGNOSIS — L84 Corns and callosities: Secondary | ICD-10-CM

## 2018-02-10 DIAGNOSIS — Z1231 Encounter for screening mammogram for malignant neoplasm of breast: Secondary | ICD-10-CM | POA: Diagnosis not present

## 2018-02-10 NOTE — Progress Notes (Signed)
Subjective: Maureen Stout is a 84 y.o. female patient with history of diabetes who returns to office today for evaluation of right and left foot ulceration and for diabetic nail care. Reports no changes with medical history since last visit saw primary care Dr. Claudie Revering 2 months ago with last blood sugar recorded at 109.  Denies nausea, vomiting, fever, chills, excessive drainage, or increased pain.  Reports that sometimes her areas are all dried and healed and then other times they start bleeding again however been self treating using Silvadene and a Band-Aid with no issues.  Patient has had this going on chronically for about 10 years but never has had infection.  Patient states that she walks around the house with socks.  No other issues noted.  Patient Active Problem List   Diagnosis Date Noted  . On amiodarone therapy 01/08/2018  . Chronic anticoagulation 01/08/2018  . Hypertensive heart disease 01/08/2018  . Elevated TSH 01/08/2018  . Pressure injury of skin 04/19/2017  . Paroxysmal atrial fibrillation ([place]) 04/18/2017   Current Outpatient Medications on File Prior to Visit  Medication Sig Dispense Refill  . allopurinol (ZYLOPRIM) 300 MG tablet Take 300 mg by mouth at bedtime.     Marland Kitchen amiodarone (PACERONE) 200 MG tablet Take 1 tablet (200 mg total) by mouth daily. 90 tablet 2  . apixaban (ELIQUIS) 2.5 MG TABS tablet Take 1 tablet (2.5 mg total) by mouth 2 (two) times daily. 90 tablet 3  . Calcium Carbonate-Vitamin D (CALCIUM-D PO) Take 1 tablet by mouth 2 (two) times daily.    . metoprolol tartrate (LOPRESSOR) 25 MG tablet Take 1 tablet (25 mg total) by mouth 2 (two) times daily. 180 tablet 3  . Multiple Vitamin (MULTI-VITAMINS) TABS Take 1 tablet by mouth daily.     . niacin 500 MG CR capsule Take 500 mg by mouth daily.    . Omega-3 Fatty Acids (FISH OIL) 1200 MG CAPS Take 1,200 mg by mouth 2 (two) times daily.    . silver sulfADIAZINE (SILVADENE) 1 % cream Apply 1 application topically  daily. To foot ulcers 50 g 0  . sitaGLIPtin (JANUVIA) 100 MG tablet Take 50 mg by mouth daily.     No current facility-administered medications on file prior to visit.    Allergies  Allergen Reactions  . Vancomycin Rash    No results found for this or any previous visit (from the past 2160 hour(s)).  Objective: General: Patient is awake, alert, and oriented x 3 and in no acute distress.  Integument: Skin is warm, dry and supple bilateral. Nails are elongated thickened and dystrophic with subungual debris, consistent with onychomycosis, 1-5 on right 1,2,4,5 on left. No signs of infection.  + ulceration sub met 1 on right and left that measures 1 x1.2 cm on left and 0.1 cm on right with keratotic margin and granular base with no surrounding signs of infection, remaining integument unremarkable.  Vasculature:  Dorsalis Pedis pulse 1/4 bilateral. Posterior Tibial pulse  1/4 bilateral. Capillary fill time <3 sec 1-5 bilateral. Scant hair growth to the level of the digits.Temperature gradient within normal limits. Mild varicosities present bilateral. No edema present bilateral.   Neurology: The patient has absent sensation measured with a 5.07/10g Semmes Weinstein Monofilament at all pedal sites bilateral . Vibratory sensation absent bilateral with tuning fork. No Babinski sign present bilateral.   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 Asymptomatic bunion and hammertoe pedal deformities noted bilateral. Muscular strength 5/5 in all lower extremity muscular  groups bilateral without pain on range of motion. No tenderness with calf compression bilateral.  Assessment and Plan: Problem List Items Addressed This Visit    None    Visit Diagnoses    Onychomycosis    -  Primary   Type 2 diabetes, controlled, with neuropathy ([place])       Foot ulcer, limited to breakdown of skin, right ([place])       Foot ulcer, left, limited to breakdown of skin (HCC)       Hallux abductovalgus, acquired,  unspecified laterality       Pre-ulcerative calluses          -Examined patient. -Discussed and educated patient on diabetic foot care, especially with  regards to the vascular, neurological and musculoskeletal systems.  -Stressed the importance of good glycemic control and the detriment of not controlling glucose levels in relation to the foot. -Mechanically debrided all nails x9 using sterile nail nipper without incident and smooth with rotary bur - Excisionally dedbrided ulceration sub met 1 on right and left to healthy bleeding borders removing nonviable and keratotic tissue using a sterile chisel blade. Wound measures post debridement as above. Wound was debrided to the level of the dermis with viable wound base exposed to promote healing. Hemostasis was achieved with manuel pressure. Patient tolerated procedure well without any discomfort or anesthesia necessary for this wound debridement.  -Applied silvadene cream, offloading pad and bandaid and instructed patient to continue with daily dressings at home consisting of the same until completely healed. Advised patient that she must be compliant with dressing the areas and using offloading padding. Advised patient to refrain from barefoot walking as previously recommended even though patient insists that she is wearing bedroom shoes I advised patient to get rid of any worn shoes that do not provide any additional offloading comfort support since her bunions are so severe and likely creating a pressure point causing her to/pre-ulcerative calluses - Advised patient to go to the ER or return to office if the wound worsens or if constitutional symptoms are present. -Answered all patient questions -Patient to return in 10-12 weeks for follow up diabetic care  and wound check on right and left foot -Patient advised to call the office if any problems or questions arise in the meantime.  Landis Martins, DPM

## 2018-03-03 DIAGNOSIS — N390 Urinary tract infection, site not specified: Secondary | ICD-10-CM | POA: Diagnosis not present

## 2018-03-03 DIAGNOSIS — N3941 Urge incontinence: Secondary | ICD-10-CM | POA: Diagnosis not present

## 2018-03-03 DIAGNOSIS — R319 Hematuria, unspecified: Secondary | ICD-10-CM | POA: Diagnosis not present

## 2018-03-14 DIAGNOSIS — L821 Other seborrheic keratosis: Secondary | ICD-10-CM | POA: Diagnosis not present

## 2018-03-14 DIAGNOSIS — L578 Other skin changes due to chronic exposure to nonionizing radiation: Secondary | ICD-10-CM | POA: Diagnosis not present

## 2018-03-14 DIAGNOSIS — L570 Actinic keratosis: Secondary | ICD-10-CM | POA: Diagnosis not present

## 2018-03-14 DIAGNOSIS — C44321 Squamous cell carcinoma of skin of nose: Secondary | ICD-10-CM | POA: Diagnosis not present

## 2018-03-28 DIAGNOSIS — E1122 Type 2 diabetes mellitus with diabetic chronic kidney disease: Secondary | ICD-10-CM | POA: Diagnosis not present

## 2018-03-28 DIAGNOSIS — M109 Gout, unspecified: Secondary | ICD-10-CM | POA: Diagnosis not present

## 2018-03-28 DIAGNOSIS — E1142 Type 2 diabetes mellitus with diabetic polyneuropathy: Secondary | ICD-10-CM | POA: Diagnosis not present

## 2018-03-28 DIAGNOSIS — Z79899 Other long term (current) drug therapy: Secondary | ICD-10-CM | POA: Diagnosis not present

## 2018-03-28 DIAGNOSIS — Z6830 Body mass index (BMI) 30.0-30.9, adult: Secondary | ICD-10-CM | POA: Diagnosis not present

## 2018-03-28 DIAGNOSIS — Z23 Encounter for immunization: Secondary | ICD-10-CM | POA: Diagnosis not present

## 2018-03-28 DIAGNOSIS — E785 Hyperlipidemia, unspecified: Secondary | ICD-10-CM | POA: Diagnosis not present

## 2018-03-28 DIAGNOSIS — D638 Anemia in other chronic diseases classified elsewhere: Secondary | ICD-10-CM | POA: Diagnosis not present

## 2018-03-28 DIAGNOSIS — I129 Hypertensive chronic kidney disease with stage 1 through stage 4 chronic kidney disease, or unspecified chronic kidney disease: Secondary | ICD-10-CM | POA: Diagnosis not present

## 2018-03-30 DIAGNOSIS — N3289 Other specified disorders of bladder: Secondary | ICD-10-CM | POA: Diagnosis not present

## 2018-03-30 DIAGNOSIS — N1330 Unspecified hydronephrosis: Secondary | ICD-10-CM | POA: Diagnosis not present

## 2018-03-30 DIAGNOSIS — N1339 Other hydronephrosis: Secondary | ICD-10-CM | POA: Diagnosis not present

## 2018-03-30 DIAGNOSIS — N184 Chronic kidney disease, stage 4 (severe): Secondary | ICD-10-CM | POA: Diagnosis not present

## 2018-03-31 ENCOUNTER — Telehealth: Payer: Self-pay | Admitting: Cardiology

## 2018-03-31 DIAGNOSIS — N183 Chronic kidney disease, stage 3 (moderate): Secondary | ICD-10-CM | POA: Diagnosis not present

## 2018-03-31 MED ORDER — AMIODARONE HCL 200 MG PO TABS: 200.0000 mg | ORAL_TABLET | Freq: Every day | ORAL | 0 refills | Status: DC

## 2018-03-31 NOTE — Telephone Encounter (Signed)
°*  STAT* If patient is at the pharmacy, call can be transferred to refill team.   1. Which medications need to be refilled? (please list name of each medication and dose if known) Amiodorone  Takes 1 daily   2. Which pharmacy/location (including street and city if local pharmacy) is medication to be sent to?[facility] pharmacy  3. Do they need a 30 day or 90 day supply? [place]

## 2018-03-31 NOTE — Telephone Encounter (Signed)
Refill for amiodarone sent to [place] as requested.

## 2018-04-11 NOTE — Progress Notes (Signed)
Cardiology Office Note:    Date:  04/12/2018   ID:  [name], DOB 23-Dec-1931, MRN [id number]  PCP:  Nicoletta Dress, MD  Cardiologist:  Shirlee More, MD    Referring MD: Nicoletta Dress, MD    ASSESSMENT:    1. Paroxysmal atrial fibrillation (HCC)   2. On amiodarone therapy   3. Chronic anticoagulation   4. Hypertensive heart disease without heart failure    PLAN:    In order of problems listed above:  1. Stable asymptomatic continue amiodarone and reduced dose anticoagulant with age and renal insufficiency.  Last chest x-ray December 2018 will do with screening for pulmonary toxicity 2. Stable continue amiodarone her PCP is following thyroid function and stable dose of Synthroid 3. Continue anticoagulant no bleeding complication 4. Stable blood pressures at target continue current treatment beta-blocker   Next appointment: 6 months   Medication Adjustments/Labs and Tests Ordered: Current medicines are reviewed at length with the patient today.  Concerns regarding medicines are outlined above.  Orders Placed This Encounter  Procedures  . EKG 12-Lead   No orders of the defined types were placed in this encounter.   Chief Complaint  Patient presents with  . Follow-up  . Atrial Fibrillation    on amiodarone    History of Present Illness:    Maureen Stout is a 84 y.o. female with a hx of atrial fibrillation hypertension dyslipidemia and chronic anticoagulation  last seen 01/10/18. She has had recurrent atrial fibrillation and was placed on amiodarone January 2019 and seen in atrial fibrillation clinic 06/17/2017 in sinus rhythm.  She has a moderate stroke risk with a chads vascular score of 5 and takes anticoagulant with Eliquis.     Compliance with diet, lifestyle and medications: Yes  When she walks in from the parking lot he climbs steps to church she is aware of her heart beating other than that she has had no palpitation syncope TIA bleeding  complication chest pain or shortness of breath.  She has increasing problems with incontinence and is due to be seen by urology as well as an appointment with nephrology for CKD Past Medical History:  Diagnosis Date  . Arrhythmia   . Bladder problem   . Diabetes mellitus without complication ([place])   . Dyslipidemia   . Hypertension   . Paroxysmal A-fib [facility])     Past Surgical History:  Procedure Laterality Date  . PARTIAL HYSTERECTOMY    . TOE SURGERY    . TONSILLECTOMY      Current Medications: Current Meds  Medication Sig  . allopurinol (ZYLOPRIM) 300 MG tablet Take 300 mg by mouth at bedtime.   Marland Kitchen amiodarone (PACERONE) 200 MG tablet Take 1 tablet (200 mg total) by mouth daily.  Marland Kitchen apixaban (ELIQUIS) 2.5 MG TABS tablet Take 1 tablet (2.5 mg total) by mouth 2 (two) times daily.  Marland Kitchen atorvastatin (LIPITOR) 10 MG tablet Take 10 mg by mouth daily.  . Calcium Carbonate-Vitamin D (CALCIUM-D PO) Take 1 tablet by mouth 2 (two) times daily.  Marland Kitchen levothyroxine (SYNTHROID, LEVOTHROID) 50 MCG tablet Take 50 mcg by mouth daily.  . metoprolol tartrate (LOPRESSOR) 25 MG tablet Take 1 tablet (25 mg total) by mouth 2 (two) times daily.  . Multiple Vitamin (MULTI-VITAMINS) TABS Take 1 tablet by mouth daily.   . niacin 500 MG CR capsule Take 500 mg by mouth daily.  . Omega-3 Fatty Acids (FISH OIL) 1200 MG CAPS Take 1,200 mg by mouth 2 (  two) times daily.  . silver sulfADIAZINE (SILVADENE) 1 % cream Apply 1 application topically daily. To foot ulcers     Allergies:   Vancomycin   Social History   Socioeconomic History  . Marital status: Married    Spouse name: Not on file  . Number of children: Not on file  . Years of education: Not on file  . Highest education level: Not on file  Occupational History  . Not on file  Social Needs  . Financial resource strain: Not on file  . Food insecurity:    Worry: Not on file    Inability: Not on file  . Transportation needs:    Medical: Not on file     Non-medical: Not on file  Tobacco Use  . Smoking status: Never Smoker  . Smokeless tobacco: Never Used  Substance and Sexual Activity  . Alcohol use: No  . Drug use: No  . Sexual activity: Not on file  Lifestyle  . Physical activity:    Days per week: Not on file    Minutes per session: Not on file  . Stress: Not on file  Relationships  . Social connections:    Talks on phone: Not on file    Gets together: Not on file    Attends religious service: Not on file    Active member of club or organization: Not on file    Attends meetings of clubs or organizations: Not on file    Relationship status: Not on file  Other Topics Concern  . Not on file  Social History Narrative  . Not on file     Family History: The patient's family history includes Breast cancer in her sister; Cancer in her father. There is no history of Diabetes or Heart attack. ROS:   Please see the history of present illness.    All other systems reviewed and are negative.  EKGs/Labs/Other Studies Reviewed:    The following studies were reviewed today:  EKG:  EKG ordered today.  The ekg ordered today demonstrates sinus bradycardia 52 bpm first-degree AV block otherwise normal  Recent Labs: From her PCP office 03/28/2018 creatinine 1.97 TSH 10.3 cholesterol 132 LDL 61 HDL 37 remainder of CMP was normal 04/18/2017: Hemoglobin 11.8; Magnesium 1.4; Platelets 194 06/17/2017: ALT 11; BUN 20; Creatinine, Ser 1.07; Potassium 4.1; Sodium 141; TSH 6.301  Recent Lipid Panel    Component Value Date/Time   CHOL 127 04/19/2017 0457   TRIG 91 04/19/2017 0457   HDL 39 (L) 04/19/2017 0457   CHOLHDL 3.3 04/19/2017 0457   VLDL 18 04/19/2017 0457   LDLCALC 70 04/19/2017 0457    Physical Exam:    VS:  BP (!) 160/80 (BP Location: Left Arm, Patient Position: Sitting, Cuff Size: Large)   Pulse (!) 52   Ht 5\' 6"  (1.676 m)   Wt 182 lb 9.6 oz (82.8 kg)   SpO2 95%   BMI 29.47 kg/m     Wt Readings from Last 3 Encounters:    04/12/18 182 lb 9.6 oz (82.8 kg)  01/10/18 177 lb 9.6 oz (80.6 kg)  09/02/17 181 lb (82.1 kg)     GEN:  Well nourished, well developed in no acute distress HEENT: Normal NECK: No JVD; No carotid bruits LYMPHATICS: No lymphadenopathy CARDIAC: RRR, no murmurs, rubs, gallops RESPIRATORY:  Clear to auscultation without rales, wheezing or rhonchi  ABDOMEN: Soft, non-tender, non-distended MUSCULOSKELETAL:  No edema; No deformity  SKIN: Warm and dry NEUROLOGIC:  Alert and oriented  x 3 PSYCHIATRIC:  Normal affect    Signed, Shirlee More, MD  04/12/2018 11:28 AM    [place]

## 2018-04-12 ENCOUNTER — Ambulatory Visit: Payer: Medicare PPO | Admitting: Cardiology

## 2018-04-12 ENCOUNTER — Encounter: Payer: Self-pay | Admitting: Cardiology

## 2018-04-12 ENCOUNTER — Ambulatory Visit (INDEPENDENT_AMBULATORY_CARE_PROVIDER_SITE_OTHER): Payer: Medicare PPO | Admitting: Cardiology

## 2018-04-12 VITALS — BP 160/80 | HR 52 | Ht 66.0 in | Wt 182.6 lb

## 2018-04-12 DIAGNOSIS — I119 Hypertensive heart disease without heart failure: Secondary | ICD-10-CM | POA: Diagnosis not present

## 2018-04-12 DIAGNOSIS — I480 Paroxysmal atrial fibrillation: Secondary | ICD-10-CM | POA: Diagnosis not present

## 2018-04-12 DIAGNOSIS — Z7901 Long term (current) use of anticoagulants: Secondary | ICD-10-CM | POA: Diagnosis not present

## 2018-04-12 DIAGNOSIS — R918 Other nonspecific abnormal finding of lung field: Secondary | ICD-10-CM | POA: Diagnosis not present

## 2018-04-12 DIAGNOSIS — R0989 Other specified symptoms and signs involving the circulatory and respiratory systems: Secondary | ICD-10-CM | POA: Diagnosis not present

## 2018-04-12 DIAGNOSIS — I700 Atherosclerosis of aorta: Secondary | ICD-10-CM | POA: Diagnosis not present

## 2018-04-12 DIAGNOSIS — I517 Cardiomegaly: Secondary | ICD-10-CM | POA: Diagnosis not present

## 2018-04-12 DIAGNOSIS — Z79899 Other long term (current) drug therapy: Secondary | ICD-10-CM

## 2018-04-12 NOTE — Patient Instructions (Addendum)
Medication Instructions:  Your physician recommends that you continue on your current medications as directed. Please refer to the Current Medication list given to you today.  If you need a refill on your cardiac medications before your next appointment, please call your pharmacy.   Lab work: None  If you have labs (blood work) drawn today and your tests are completely normal, you will receive your results only by: Marland Kitchen MyChart Message (if you have MyChart) OR . A paper copy in the mail If you have any lab test that is abnormal or we need to change your treatment, we will call you to review the results.  Testing/Procedures: You had an EKG today.   A chest x-ray takes a picture of the organs and structures inside the chest, including the heart, lungs, and blood vessels. This test can show several things, including, whether the heart is enlarges; whether fluid is building up in the lungs; and whether pacemaker / defibrillator leads are still in place.   Follow-Up: At [facility], you and your health needs are our priority.  As part of our continuing mission to provide you with exceptional heart care, we have created designated Provider Care Teams.  These Care Teams include your primary Cardiologist (physician) and Advanced Practice Providers (APPs -  Physician Assistants and Nurse Practitioners) who all work together to provide you with the care you need, when you need it. You will need a follow up appointment in 6 months.  Please call our office 2 months in advance to schedule this appointment.

## 2018-04-13 ENCOUNTER — Telehealth: Payer: Self-pay

## 2018-04-13 DIAGNOSIS — I10 Essential (primary) hypertension: Secondary | ICD-10-CM

## 2018-04-13 DIAGNOSIS — R0602 Shortness of breath: Secondary | ICD-10-CM

## 2018-04-13 DIAGNOSIS — R9389 Abnormal findings on diagnostic imaging of other specified body structures: Secondary | ICD-10-CM

## 2018-04-13 HISTORY — DX: Abnormal findings on diagnostic imaging of other specified body structures: R93.89

## 2018-04-13 NOTE — Telephone Encounter (Signed)
Patient aware of abnormal chest xray results per Dr Bettina Gavia, and she should proceed with follow up CT Chest for further evaluation.   Patient aware that she will be called with appointment at [facility].  Patient agrees to plan and verbalized understanding.

## 2018-04-18 DIAGNOSIS — I10 Essential (primary) hypertension: Secondary | ICD-10-CM | POA: Diagnosis not present

## 2018-04-18 LAB — BASIC METABOLIC PANEL
BUN/Creatinine Ratio: 23 (ref 12–28)
BUN: 31 mg/dL — AB (ref 8–27)
CHLORIDE: 101 mmol/L (ref 96–106)
CO2: 24 mmol/L (ref 20–29)
Calcium: 9.1 mg/dL (ref 8.7–10.3)
Creatinine, Ser: 1.32 mg/dL — ABNORMAL HIGH (ref 0.57–1.00)
GFR calc Af Amer: 43 mL/min/{1.73_m2} — ABNORMAL LOW (ref >59)
GFR calc non Af Amer: 37 mL/min/{1.73_m2} — ABNORMAL LOW (ref >59)
GLUCOSE: 111 mg/dL — AB (ref 65–99)
POTASSIUM: 4.1 mmol/L (ref 3.5–5.2)
SODIUM: 138 mmol/L (ref 134–144)

## 2018-04-21 ENCOUNTER — Other Ambulatory Visit: Payer: Self-pay | Admitting: *Deleted

## 2018-04-21 DIAGNOSIS — R9389 Abnormal findings on diagnostic imaging of other specified body structures: Secondary | ICD-10-CM | POA: Diagnosis not present

## 2018-04-21 DIAGNOSIS — J984 Other disorders of lung: Secondary | ICD-10-CM | POA: Diagnosis not present

## 2018-04-21 DIAGNOSIS — R0602 Shortness of breath: Secondary | ICD-10-CM

## 2018-04-21 DIAGNOSIS — I700 Atherosclerosis of aorta: Secondary | ICD-10-CM | POA: Diagnosis not present

## 2018-04-25 ENCOUNTER — Telehealth: Payer: Self-pay | Admitting: *Deleted

## 2018-04-25 NOTE — Telephone Encounter (Signed)
Patient informed of CT chest results and advised that everything looks good per Dr. Bettina Gavia.

## 2018-04-25 NOTE — Telephone Encounter (Signed)
-----   Message from Richardo Priest, MD sent at 04/21/2018  5:58 PM EST ----- CT scan of her chest is good there is no abnormality

## 2018-04-26 DIAGNOSIS — R600 Localized edema: Secondary | ICD-10-CM | POA: Diagnosis not present

## 2018-04-26 DIAGNOSIS — E039 Hypothyroidism, unspecified: Secondary | ICD-10-CM | POA: Diagnosis not present

## 2018-04-27 ENCOUNTER — Ambulatory Visit (INDEPENDENT_AMBULATORY_CARE_PROVIDER_SITE_OTHER): Payer: Medicare PPO | Admitting: Sports Medicine

## 2018-04-27 ENCOUNTER — Encounter: Payer: Self-pay | Admitting: Sports Medicine

## 2018-04-27 VITALS — BP 150/58 | HR 58 | Temp 97.8°F | Resp 16

## 2018-04-27 DIAGNOSIS — E1140 Type 2 diabetes mellitus with diabetic neuropathy, unspecified: Secondary | ICD-10-CM

## 2018-04-27 DIAGNOSIS — L97511 Non-pressure chronic ulcer of other part of right foot limited to breakdown of skin: Secondary | ICD-10-CM | POA: Diagnosis not present

## 2018-04-27 DIAGNOSIS — L97521 Non-pressure chronic ulcer of other part of left foot limited to breakdown of skin: Secondary | ICD-10-CM

## 2018-04-27 DIAGNOSIS — B351 Tinea unguium: Secondary | ICD-10-CM | POA: Diagnosis not present

## 2018-04-27 DIAGNOSIS — M2010 Hallux valgus (acquired), unspecified foot: Secondary | ICD-10-CM

## 2018-04-27 MED ORDER — MEDIHONEY WOUND/BURN DRESSING EX GEL: CUTANEOUS | 1 refills | Status: DC

## 2018-04-27 NOTE — Progress Notes (Signed)
Subjective: BIANKA LIBERATI is a 84 y.o. female patient with history of diabetes who returns to office today for evaluation of right and left foot ulceration and for diabetic nail care. Reports her kidneys are doing bad and more swelling in legs since last visit saw primary care Dr. Claudie Revering 1 week ago with last blood sugar recorded at 98.  Denies nausea, vomiting, fever, chills, excessive drainage, or increased pain.  Reports that sometimes her areas are all dried and healed and then other times they start bleeding again however been self treating using Silvadene and a Band-Aid with no issues.  No other issues noted.  Patient Active Problem List   Diagnosis Date Noted  . Abnormal CXR 04/13/2018  . On amiodarone therapy 01/08/2018  . Chronic anticoagulation 01/08/2018  . Hypertensive heart disease 01/08/2018  . Elevated TSH 01/08/2018  . Pressure injury of skin 04/19/2017  . Paroxysmal atrial fibrillation ([place]) 04/18/2017   Current Outpatient Medications on File Prior to Visit  Medication Sig Dispense Refill  . allopurinol (ZYLOPRIM) 300 MG tablet Take 300 mg by mouth at bedtime.     Marland Kitchen amiodarone (PACERONE) 200 MG tablet Take 1 tablet (200 mg total) by mouth daily. 90 tablet 0  . apixaban (ELIQUIS) 2.5 MG TABS tablet Take 1 tablet (2.5 mg total) by mouth 2 (two) times daily. 90 tablet 3  . atorvastatin (LIPITOR) 10 MG tablet Take 10 mg by mouth daily.    . Calcium Carbonate-Vitamin D (CALCIUM-D PO) Take 1 tablet by mouth 2 (two) times daily.    Marland Kitchen levothyroxine (SYNTHROID, LEVOTHROID) 50 MCG tablet Take 50 mcg by mouth daily.    . metoprolol tartrate (LOPRESSOR) 25 MG tablet Take 1 tablet (25 mg total) by mouth 2 (two) times daily. 180 tablet 3  . Multiple Vitamin (MULTI-VITAMINS) TABS Take 1 tablet by mouth daily.     . niacin 500 MG CR capsule Take 500 mg by mouth daily.    . Omega-3 Fatty Acids (FISH OIL) 1200 MG CAPS Take 1,200 mg by mouth 2 (two) times daily.    . silver sulfADIAZINE  (SILVADENE) 1 % cream Apply 1 application topically daily. To foot ulcers 50 g 0   No current facility-administered medications on file prior to visit.    Allergies  Allergen Reactions  . Vancomycin Rash    Recent Results (from the past 2160 hour(s))  Basic metabolic panel     Status: Abnormal   Collection Time: 04/18/18 12:00 AM  Result Value Ref Range   Glucose 111 (H) 65 - 99 mg/dL   BUN 31 (H) 8 - 27 mg/dL   Creatinine, Ser 1.32 (H) 0.57 - 1.00 mg/dL   GFR calc non Af Amer 37 (L) >59 mL/min/1.73   GFR calc Af Amer 43 (L) >59 mL/min/1.73   BUN/Creatinine Ratio 23 12 - 28   Sodium 138 134 - 144 mmol/L   Potassium 4.1 3.5 - 5.2 mmol/L   Chloride 101 96 - 106 mmol/L   CO2 24 20 - 29 mmol/L   Calcium 9.1 8.7 - 10.3 mg/dL    Objective: General: Patient is awake, alert, and oriented x 3 and in no acute distress.  Integument: Skin is warm, dry and supple bilateral. Nails are elongated thickened and dystrophic with subungual debris, consistent with onychomycosis, 1-5 on right 1,2,4,5 on left. No signs of infection.  + ulceration sub met 1 on right and left that measures 1 x1.2x0.1 cm on left and 0.5x0.8x0.1 cm on right with keratotic  margin and granular base with no surrounding signs of infection, remaining integument unremarkable.  Vasculature:  Dorsalis Pedis pulse 1/4 bilateral. Posterior Tibial pulse  1/4 bilateral. Capillary fill time <3 sec 1-5 bilateral. Scant hair growth to the level of the digits.Temperature gradient within normal limits. Mild varicosities present bilateral. No edema present bilateral.   Neurology: The patient has absent sensation measured with a 5.07/10g Semmes Weinstein Monofilament at all pedal sites bilateral . Vibratory sensation absent bilateral with tuning fork. No Babinski sign present bilateral.   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 Asymptomatic bunion and hammertoe pedal deformities noted bilateral. Muscular strength 5/5 in all lower  extremity muscular groups bilateral without pain on range of motion. No tenderness with calf compression bilateral.  Assessment and Plan: Problem List Items Addressed This Visit    None    Visit Diagnoses    Onychomycosis    -  Primary   Type 2 diabetes, controlled, with neuropathy ([place])       Foot ulcer, limited to breakdown of skin, right ([place])       Foot ulcer, left, limited to breakdown of skin ([facility])       Hallux abductovalgus, acquired, unspecified laterality          -Examined patient. -Discussed and educated patient on diabetic foot care, especially with  regards to the vascular, neurological and musculoskeletal systems.  -Stressed the importance of good glycemic control and the detriment of not controlling glucose levels in relation to the foot. -Mechanically debrided all nails x9 using sterile nail nipper without incident and smooth with rotary bur - Excisionally dedbrided ulceration sub met 1 on right and left to healthy bleeding borders removing nonviable and keratotic tissue using a sterile chisel blade. Wound measures post debridement as above. Wound was debrided to the level of the dermis with viable wound base exposed to promote healing. Hemostasis was achieved with manuel pressure. Patient tolerated procedure well without any discomfort or anesthesia necessary for this wound debridement.  -Applied medihoney, offloading pad and bandaid and instructed patient to continue with daily dressings at home consisting of the same until completely healed.  Encouraged patient again to continue with using offloading padding and to refrain from walking barefoot to help keep pressure off the areas to help the ulcers heal - Advised patient to go to the ER or return to office if the wound worsens or if constitutional symptoms are present. -Answered all patient questions -Patient to return in 9 weeks for follow up diabetic care  and wound check on right and left foot -Patient advised to call  the office if any problems or questions arise in the meantime.  Landis Martins, DPM

## 2018-05-02 DIAGNOSIS — R8290 Unspecified abnormal findings in urine: Secondary | ICD-10-CM | POA: Diagnosis not present

## 2018-05-02 DIAGNOSIS — E1122 Type 2 diabetes mellitus with diabetic chronic kidney disease: Secondary | ICD-10-CM | POA: Diagnosis not present

## 2018-05-02 DIAGNOSIS — I129 Hypertensive chronic kidney disease with stage 1 through stage 4 chronic kidney disease, or unspecified chronic kidney disease: Secondary | ICD-10-CM | POA: Diagnosis not present

## 2018-05-02 DIAGNOSIS — N179 Acute kidney failure, unspecified: Secondary | ICD-10-CM | POA: Diagnosis not present

## 2018-05-02 DIAGNOSIS — N183 Chronic kidney disease, stage 3 (moderate): Secondary | ICD-10-CM | POA: Diagnosis not present

## 2018-06-06 ENCOUNTER — Other Ambulatory Visit: Payer: Self-pay | Admitting: Cardiology

## 2018-06-15 DIAGNOSIS — R319 Hematuria, unspecified: Secondary | ICD-10-CM | POA: Diagnosis not present

## 2018-06-15 DIAGNOSIS — N390 Urinary tract infection, site not specified: Secondary | ICD-10-CM | POA: Diagnosis not present

## 2018-06-15 DIAGNOSIS — N3941 Urge incontinence: Secondary | ICD-10-CM | POA: Diagnosis not present

## 2018-06-20 DIAGNOSIS — R319 Hematuria, unspecified: Secondary | ICD-10-CM | POA: Diagnosis not present

## 2018-06-20 DIAGNOSIS — N390 Urinary tract infection, site not specified: Secondary | ICD-10-CM | POA: Diagnosis not present

## 2018-06-21 DIAGNOSIS — R3915 Urgency of urination: Secondary | ICD-10-CM | POA: Diagnosis not present

## 2018-06-21 DIAGNOSIS — N390 Urinary tract infection, site not specified: Secondary | ICD-10-CM | POA: Diagnosis not present

## 2018-06-21 DIAGNOSIS — R350 Frequency of micturition: Secondary | ICD-10-CM | POA: Diagnosis not present

## 2018-06-21 DIAGNOSIS — R319 Hematuria, unspecified: Secondary | ICD-10-CM | POA: Diagnosis not present

## 2018-06-22 DIAGNOSIS — N390 Urinary tract infection, site not specified: Secondary | ICD-10-CM | POA: Diagnosis not present

## 2018-06-22 DIAGNOSIS — R319 Hematuria, unspecified: Secondary | ICD-10-CM | POA: Diagnosis not present

## 2018-06-23 DIAGNOSIS — R319 Hematuria, unspecified: Secondary | ICD-10-CM | POA: Diagnosis not present

## 2018-06-23 DIAGNOSIS — N390 Urinary tract infection, site not specified: Secondary | ICD-10-CM | POA: Diagnosis not present

## 2018-06-24 DIAGNOSIS — N390 Urinary tract infection, site not specified: Secondary | ICD-10-CM | POA: Diagnosis not present

## 2018-06-27 DIAGNOSIS — N390 Urinary tract infection, site not specified: Secondary | ICD-10-CM | POA: Diagnosis not present

## 2018-06-27 DIAGNOSIS — R319 Hematuria, unspecified: Secondary | ICD-10-CM | POA: Diagnosis not present

## 2018-06-28 DIAGNOSIS — N390 Urinary tract infection, site not specified: Secondary | ICD-10-CM | POA: Diagnosis not present

## 2018-06-29 ENCOUNTER — Ambulatory Visit (INDEPENDENT_AMBULATORY_CARE_PROVIDER_SITE_OTHER): Payer: Medicare PPO | Admitting: Sports Medicine

## 2018-06-29 ENCOUNTER — Encounter: Payer: Self-pay | Admitting: Sports Medicine

## 2018-06-29 VITALS — BP 178/79 | HR 54 | Temp 98.0°F | Resp 16

## 2018-06-29 DIAGNOSIS — E1140 Type 2 diabetes mellitus with diabetic neuropathy, unspecified: Secondary | ICD-10-CM

## 2018-06-29 DIAGNOSIS — M79675 Pain in left toe(s): Secondary | ICD-10-CM

## 2018-06-29 DIAGNOSIS — B351 Tinea unguium: Secondary | ICD-10-CM | POA: Diagnosis not present

## 2018-06-29 DIAGNOSIS — L97511 Non-pressure chronic ulcer of other part of right foot limited to breakdown of skin: Secondary | ICD-10-CM

## 2018-06-29 DIAGNOSIS — L97521 Non-pressure chronic ulcer of other part of left foot limited to breakdown of skin: Secondary | ICD-10-CM | POA: Diagnosis not present

## 2018-06-29 DIAGNOSIS — M79674 Pain in right toe(s): Secondary | ICD-10-CM | POA: Diagnosis not present

## 2018-06-29 NOTE — Progress Notes (Signed)
Subjective: Maureen Stout is a 85 y.o. female patient with history of diabetes who returns to office today for evaluation of right and left foot ulceration and for diabetic nail care. Reports still having issues with kidneys since last visit saw primary care Dr. Claudie Revering a few weeks ago with last blood sugar recorded at 106.  Denies nausea, vomiting, fever, chills, excessive drainage, or increased pain for chronic wounds.  Reports a little bleeding from the left but seems to be a lot better and has been using Silvadene because the pharmacy did not have the [place] and a Band-Aid with no issues.  No other issues noted.  Patient Active Problem List   Diagnosis Date Noted  . Abnormal CXR 04/13/2018  . On amiodarone therapy 01/08/2018  . Chronic anticoagulation 01/08/2018  . Hypertensive heart disease 01/08/2018  . Elevated TSH 01/08/2018  . Pressure injury of skin 04/19/2017  . Paroxysmal atrial fibrillation ([place]) 04/18/2017   Current Outpatient Medications on File Prior to Visit  Medication Sig Dispense Refill  . allopurinol (ZYLOPRIM) 300 MG tablet Take 300 mg by mouth at bedtime.     Marland Kitchen amiodarone (PACERONE) 200 MG tablet TAKE 1 TABLET EVERY DAY 90 tablet 0  . apixaban (ELIQUIS) 2.5 MG TABS tablet Take 1 tablet (2.5 mg total) by mouth 2 (two) times daily. 90 tablet 3  . atorvastatin (LIPITOR) 10 MG tablet Take 10 mg by mouth daily.    . Calcium Carbonate-Vitamin D (CALCIUM-D PO) Take 1 tablet by mouth 2 (two) times daily.    Marland Kitchen levothyroxine (SYNTHROID, LEVOTHROID) 50 MCG tablet Take 50 mcg by mouth daily.    . metoprolol tartrate (LOPRESSOR) 25 MG tablet Take 1 tablet (25 mg total) by mouth 2 (two) times daily. 180 tablet 3  . Multiple Vitamin (MULTI-VITAMINS) TABS Take 1 tablet by mouth daily.     . niacin 500 MG CR capsule Take 500 mg by mouth daily.    . Omega-3 Fatty Acids (FISH OIL) 1200 MG CAPS Take 1,200 mg by mouth 2 (two) times daily.    . silver sulfADIAZINE (SILVADENE) 1 % cream  Apply 1 application topically daily. To foot ulcers 50 g 0  . Wound Dressings (MEDIHONEY WOUND/BURN DRESSING) GEL Apply a small amount to wounds daily 44 mL 1   No current facility-administered medications on file prior to visit.    Allergies  Allergen Reactions  . Vancomycin Rash    Recent Results (from the past 2160 hour(s))  Basic metabolic panel     Status: Abnormal   Collection Time: 04/18/18 12:00 AM  Result Value Ref Range   Glucose 111 (H) 65 - 99 mg/dL   BUN 31 (H) 8 - 27 mg/dL   Creatinine, Ser 1.32 (H) 0.57 - 1.00 mg/dL   GFR calc non Af Amer 37 (L) >59 mL/min/1.73   GFR calc Af Amer 43 (L) >59 mL/min/1.73   BUN/Creatinine Ratio 23 12 - 28   Sodium 138 134 - 144 mmol/L   Potassium 4.1 3.5 - 5.2 mmol/L   Chloride 101 96 - 106 mmol/L   CO2 24 20 - 29 mmol/L   Calcium 9.1 8.7 - 10.3 mg/dL    Objective: General: Patient is awake, alert, and oriented x 3 and in no acute distress.  Integument: Skin is warm, dry and supple bilateral. Nails are elongated thickened and dystrophic with subungual debris, consistent with onychomycosis, 1-5 on right 1,2,4,5 on left. No signs of infection.  + ulceration sub met 1 on right  and left that measures 1 x1.5x0.1 cm on left and 0.4x0.5x0.1 cm on right with keratotic margin and granular base with no surrounding signs of infection, remaining integument unremarkable.  Vasculature:  Dorsalis Pedis pulse 1/4 bilateral. Posterior Tibial pulse  1/4 bilateral. Capillary fill time <3 sec 1-5 bilateral. Scant hair growth to the level of the digits.Temperature gradient within normal limits. Mild varicosities present bilateral. No edema present bilateral.   Neurology: The patient has absent sensation measured with a 5.07/10g Semmes Weinstein Monofilament at all pedal sites bilateral . Vibratory sensation absent bilateral with tuning fork. No Babinski sign present bilateral.   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 Asymptomatic bunion and  hammertoe pedal deformities noted bilateral. Muscular strength 5/5 in all lower extremity muscular groups bilateral without pain on range of motion. No tenderness with calf compression bilateral.  Assessment and Plan: Problem List Items Addressed This Visit    None    Visit Diagnoses    Pain due to onychomycosis of toenails of both feet    -  Primary   Foot ulcer, limited to breakdown of skin, right ([facility])       Foot ulcer, left, limited to breakdown of skin ([facility])       Type 2 diabetes, controlled, with neuropathy ([place])         -Examined patient. -Discussed and educated patient on diabetic foot care, especially with  regards to the vascular, neurological and musculoskeletal systems.  -Mechanically debrided all nails x9 using sterile nail nipper without incident and smooth with rotary bur - Excisionally dedbrided ulceration sub met 1 on right and left to healthy bleeding borders removing nonviable and keratotic tissue using a sterile chisel blade. Wound measures post debridement as above. Wound was debrided to the level of the dermis with viable wound base exposed to promote healing. Hemostasis was achieved with manuel pressure. Patient tolerated procedure well without any discomfort or anesthesia necessary for this wound debridement.  -Applied medihoney, offloading pad and bandaid and instructed patient to continue with daily dressings at home consisting of the same until completely healed.  Patient to go by her pharmacy again to see if they have the [place] in stock if they do not we will resume using Silvadene and encouraged patient to be compliant with offloading these areas because they will never heal if under pressure   Advised patient to go to the ER or return to office if the wound worsens or if constitutional symptoms are present. -Answered all patient questions -Patient to return in 9 weeks for follow up diabetic care  and wound check on right and left foot -Patient advised to call  the office if any problems or questions arise in the meantime.  Landis Martins, DPM

## 2018-07-02 DIAGNOSIS — R58 Hemorrhage, not elsewhere classified: Secondary | ICD-10-CM | POA: Diagnosis not present

## 2018-07-02 DIAGNOSIS — S41111A Laceration without foreign body of right upper arm, initial encounter: Secondary | ICD-10-CM | POA: Diagnosis not present

## 2018-07-02 DIAGNOSIS — S41111D Laceration without foreign body of right upper arm, subsequent encounter: Secondary | ICD-10-CM | POA: Diagnosis not present

## 2018-07-02 DIAGNOSIS — I10 Essential (primary) hypertension: Secondary | ICD-10-CM | POA: Diagnosis not present

## 2018-07-04 DIAGNOSIS — N390 Urinary tract infection, site not specified: Secondary | ICD-10-CM | POA: Diagnosis not present

## 2018-07-04 DIAGNOSIS — R319 Hematuria, unspecified: Secondary | ICD-10-CM | POA: Diagnosis not present

## 2018-07-05 ENCOUNTER — Other Ambulatory Visit: Payer: Self-pay | Admitting: Cardiology

## 2018-07-05 DIAGNOSIS — I129 Hypertensive chronic kidney disease with stage 1 through stage 4 chronic kidney disease, or unspecified chronic kidney disease: Secondary | ICD-10-CM | POA: Diagnosis not present

## 2018-07-05 DIAGNOSIS — I482 Chronic atrial fibrillation, unspecified: Secondary | ICD-10-CM | POA: Diagnosis not present

## 2018-07-05 DIAGNOSIS — E1142 Type 2 diabetes mellitus with diabetic polyneuropathy: Secondary | ICD-10-CM | POA: Diagnosis not present

## 2018-07-05 DIAGNOSIS — E039 Hypothyroidism, unspecified: Secondary | ICD-10-CM | POA: Diagnosis not present

## 2018-07-05 DIAGNOSIS — D638 Anemia in other chronic diseases classified elsewhere: Secondary | ICD-10-CM | POA: Diagnosis not present

## 2018-07-05 DIAGNOSIS — E785 Hyperlipidemia, unspecified: Secondary | ICD-10-CM | POA: Diagnosis not present

## 2018-07-05 DIAGNOSIS — E1122 Type 2 diabetes mellitus with diabetic chronic kidney disease: Secondary | ICD-10-CM | POA: Diagnosis not present

## 2018-07-05 DIAGNOSIS — N390 Urinary tract infection, site not specified: Secondary | ICD-10-CM | POA: Diagnosis not present

## 2018-07-05 DIAGNOSIS — M109 Gout, unspecified: Secondary | ICD-10-CM | POA: Diagnosis not present

## 2018-07-05 DIAGNOSIS — N183 Chronic kidney disease, stage 3 (moderate): Secondary | ICD-10-CM | POA: Diagnosis not present

## 2018-07-06 DIAGNOSIS — N390 Urinary tract infection, site not specified: Secondary | ICD-10-CM | POA: Diagnosis not present

## 2018-07-07 DIAGNOSIS — N390 Urinary tract infection, site not specified: Secondary | ICD-10-CM | POA: Diagnosis not present

## 2018-07-11 DIAGNOSIS — N184 Chronic kidney disease, stage 4 (severe): Secondary | ICD-10-CM | POA: Diagnosis not present

## 2018-07-12 DIAGNOSIS — R8290 Unspecified abnormal findings in urine: Secondary | ICD-10-CM | POA: Diagnosis not present

## 2018-07-12 DIAGNOSIS — E1122 Type 2 diabetes mellitus with diabetic chronic kidney disease: Secondary | ICD-10-CM | POA: Diagnosis not present

## 2018-07-12 DIAGNOSIS — N179 Acute kidney failure, unspecified: Secondary | ICD-10-CM | POA: Diagnosis not present

## 2018-07-12 DIAGNOSIS — N183 Chronic kidney disease, stage 3 (moderate): Secondary | ICD-10-CM | POA: Diagnosis not present

## 2018-07-12 DIAGNOSIS — I129 Hypertensive chronic kidney disease with stage 1 through stage 4 chronic kidney disease, or unspecified chronic kidney disease: Secondary | ICD-10-CM | POA: Diagnosis not present

## 2018-07-18 DIAGNOSIS — R319 Hematuria, unspecified: Secondary | ICD-10-CM | POA: Diagnosis not present

## 2018-07-18 DIAGNOSIS — N390 Urinary tract infection, site not specified: Secondary | ICD-10-CM | POA: Diagnosis not present

## 2018-07-19 DIAGNOSIS — Z6830 Body mass index (BMI) 30.0-30.9, adult: Secondary | ICD-10-CM | POA: Diagnosis not present

## 2018-07-19 DIAGNOSIS — I129 Hypertensive chronic kidney disease with stage 1 through stage 4 chronic kidney disease, or unspecified chronic kidney disease: Secondary | ICD-10-CM | POA: Diagnosis not present

## 2018-07-21 DIAGNOSIS — N390 Urinary tract infection, site not specified: Secondary | ICD-10-CM | POA: Diagnosis not present

## 2018-07-25 DIAGNOSIS — I129 Hypertensive chronic kidney disease with stage 1 through stage 4 chronic kidney disease, or unspecified chronic kidney disease: Secondary | ICD-10-CM | POA: Diagnosis not present

## 2018-07-25 DIAGNOSIS — N184 Chronic kidney disease, stage 4 (severe): Secondary | ICD-10-CM | POA: Diagnosis not present

## 2018-07-28 DIAGNOSIS — L97519 Non-pressure chronic ulcer of other part of right foot with unspecified severity: Secondary | ICD-10-CM | POA: Diagnosis not present

## 2018-07-28 DIAGNOSIS — M109 Gout, unspecified: Secondary | ICD-10-CM | POA: Diagnosis not present

## 2018-07-28 DIAGNOSIS — N3281 Overactive bladder: Secondary | ICD-10-CM | POA: Diagnosis not present

## 2018-07-28 DIAGNOSIS — E785 Hyperlipidemia, unspecified: Secondary | ICD-10-CM | POA: Diagnosis not present

## 2018-07-28 DIAGNOSIS — I10 Essential (primary) hypertension: Secondary | ICD-10-CM | POA: Diagnosis not present

## 2018-07-28 DIAGNOSIS — I4891 Unspecified atrial fibrillation: Secondary | ICD-10-CM | POA: Diagnosis not present

## 2018-07-28 DIAGNOSIS — L97529 Non-pressure chronic ulcer of other part of left foot with unspecified severity: Secondary | ICD-10-CM | POA: Diagnosis not present

## 2018-07-28 DIAGNOSIS — E11621 Type 2 diabetes mellitus with foot ulcer: Secondary | ICD-10-CM | POA: Diagnosis not present

## 2018-07-28 DIAGNOSIS — E039 Hypothyroidism, unspecified: Secondary | ICD-10-CM | POA: Diagnosis not present

## 2018-08-02 DIAGNOSIS — I129 Hypertensive chronic kidney disease with stage 1 through stage 4 chronic kidney disease, or unspecified chronic kidney disease: Secondary | ICD-10-CM | POA: Diagnosis not present

## 2018-08-08 ENCOUNTER — Other Ambulatory Visit: Payer: Self-pay | Admitting: Cardiology

## 2018-08-08 DIAGNOSIS — N390 Urinary tract infection, site not specified: Secondary | ICD-10-CM | POA: Diagnosis not present

## 2018-08-10 ENCOUNTER — Telehealth: Payer: Self-pay | Admitting: Cardiology

## 2018-08-10 DIAGNOSIS — N184 Chronic kidney disease, stage 4 (severe): Secondary | ICD-10-CM | POA: Diagnosis not present

## 2018-08-10 DIAGNOSIS — N390 Urinary tract infection, site not specified: Secondary | ICD-10-CM | POA: Diagnosis not present

## 2018-08-10 DIAGNOSIS — E1122 Type 2 diabetes mellitus with diabetic chronic kidney disease: Secondary | ICD-10-CM | POA: Diagnosis not present

## 2018-08-10 DIAGNOSIS — N179 Acute kidney failure, unspecified: Secondary | ICD-10-CM | POA: Diagnosis not present

## 2018-08-10 DIAGNOSIS — D631 Anemia in chronic kidney disease: Secondary | ICD-10-CM | POA: Diagnosis not present

## 2018-08-10 DIAGNOSIS — I129 Hypertensive chronic kidney disease with stage 1 through stage 4 chronic kidney disease, or unspecified chronic kidney disease: Secondary | ICD-10-CM | POA: Diagnosis not present

## 2018-08-10 DIAGNOSIS — I480 Paroxysmal atrial fibrillation: Secondary | ICD-10-CM | POA: Diagnosis not present

## 2018-08-10 NOTE — Telephone Encounter (Signed)
Went to kidney doctor today and they told her to see BJM because her BP is "sky high" and she is dizzy

## 2018-08-10 NOTE — Telephone Encounter (Signed)
Phoned patient who states she has been dizzy for 1-2 months and has had 3-4 falls in the last month. She uses a walker and each time she has fallen, has had to call her son to come help her get up off the floor.  Today she was seen by Urology for a UTI and she also saw her Nephrologist today. The nephrologist's office told her her blood pressure was high but she doesn't know what the reading was.  The patient checks and records her BP and weight every morning. Her morning  BP today was 135/77. She rechecked it when we spoke and it was 116/80 with a heart rate of 47.  Her weight today was 176 pounds and this has decreased over the last week, down from 180 pounds.  She reports pedal edema over the last few weeks such that she's had to wear a full size bigger shoes.  The patient reports taking all medications as ordered.  Her Primary care physician Dr. Delena Bali was made aware of her dizziness and falls at her appointment on 08/02/2018.   Please advise

## 2018-08-11 NOTE — Telephone Encounter (Signed)
Phoned patient and informed that Dr. Bettina Gavia has reviewed all information and would like her to follow-up with her PCP regarding her symptoms and incidents. Patient states she has a visit scheduled with PCP 08/31/2018 and will communicate all of this information at that visit.

## 2018-08-30 DIAGNOSIS — I129 Hypertensive chronic kidney disease with stage 1 through stage 4 chronic kidney disease, or unspecified chronic kidney disease: Secondary | ICD-10-CM | POA: Diagnosis not present

## 2018-08-31 ENCOUNTER — Ambulatory Visit: Payer: Medicare PPO | Admitting: Sports Medicine

## 2018-09-02 DIAGNOSIS — N183 Chronic kidney disease, stage 3 (moderate): Secondary | ICD-10-CM | POA: Diagnosis not present

## 2018-09-02 DIAGNOSIS — R339 Retention of urine, unspecified: Secondary | ICD-10-CM | POA: Diagnosis not present

## 2018-09-02 DIAGNOSIS — N184 Chronic kidney disease, stage 4 (severe): Secondary | ICD-10-CM | POA: Diagnosis not present

## 2018-09-02 DIAGNOSIS — R001 Bradycardia, unspecified: Secondary | ICD-10-CM | POA: Diagnosis not present

## 2018-09-02 DIAGNOSIS — R296 Repeated falls: Secondary | ICD-10-CM | POA: Diagnosis not present

## 2018-09-02 DIAGNOSIS — R809 Proteinuria, unspecified: Secondary | ICD-10-CM | POA: Diagnosis not present

## 2018-09-02 DIAGNOSIS — E1122 Type 2 diabetes mellitus with diabetic chronic kidney disease: Secondary | ICD-10-CM | POA: Diagnosis not present

## 2018-09-02 DIAGNOSIS — N179 Acute kidney failure, unspecified: Secondary | ICD-10-CM | POA: Diagnosis not present

## 2018-09-02 DIAGNOSIS — I129 Hypertensive chronic kidney disease with stage 1 through stage 4 chronic kidney disease, or unspecified chronic kidney disease: Secondary | ICD-10-CM | POA: Diagnosis not present

## 2018-09-06 ENCOUNTER — Inpatient Hospital Stay (HOSPITAL_COMMUNITY)
Admission: EM | Admit: 2018-09-06 | Discharge: 2018-09-19 | DRG: 871 | Disposition: A | Discharge status: Rehabilitation Facility | Payer: Medicare PPO | Attending: Internal Medicine | Admitting: Internal Medicine

## 2018-09-06 ENCOUNTER — Other Ambulatory Visit: Payer: Self-pay

## 2018-09-06 ENCOUNTER — Encounter (HOSPITAL_COMMUNITY): Payer: Self-pay

## 2018-09-06 ENCOUNTER — Emergency Department (HOSPITAL_COMMUNITY): Payer: Medicare PPO

## 2018-09-06 DIAGNOSIS — N390 Urinary tract infection, site not specified: Secondary | ICD-10-CM | POA: Diagnosis present

## 2018-09-06 DIAGNOSIS — I5030 Unspecified diastolic (congestive) heart failure: Secondary | ICD-10-CM | POA: Diagnosis not present

## 2018-09-06 DIAGNOSIS — I959 Hypotension, unspecified: Secondary | ICD-10-CM | POA: Diagnosis not present

## 2018-09-06 DIAGNOSIS — E039 Hypothyroidism, unspecified: Secondary | ICD-10-CM | POA: Diagnosis present

## 2018-09-06 DIAGNOSIS — R1011 Right upper quadrant pain: Secondary | ICD-10-CM | POA: Diagnosis not present

## 2018-09-06 DIAGNOSIS — I4892 Unspecified atrial flutter: Secondary | ICD-10-CM | POA: Diagnosis present

## 2018-09-06 DIAGNOSIS — D638 Anemia in other chronic diseases classified elsewhere: Secondary | ICD-10-CM | POA: Diagnosis not present

## 2018-09-06 DIAGNOSIS — R627 Adult failure to thrive: Secondary | ICD-10-CM | POA: Diagnosis present

## 2018-09-06 DIAGNOSIS — K5790 Diverticulosis of intestine, part unspecified, without perforation or abscess without bleeding: Secondary | ICD-10-CM | POA: Diagnosis not present

## 2018-09-06 DIAGNOSIS — R7881 Bacteremia: Secondary | ICD-10-CM | POA: Diagnosis not present

## 2018-09-06 DIAGNOSIS — D696 Thrombocytopenia, unspecified: Secondary | ICD-10-CM | POA: Diagnosis present

## 2018-09-06 DIAGNOSIS — R41841 Cognitive communication deficit: Secondary | ICD-10-CM | POA: Diagnosis present

## 2018-09-06 DIAGNOSIS — Z79899 Other long term (current) drug therapy: Secondary | ICD-10-CM | POA: Diagnosis not present

## 2018-09-06 DIAGNOSIS — R001 Bradycardia, unspecified: Secondary | ICD-10-CM | POA: Diagnosis not present

## 2018-09-06 DIAGNOSIS — I482 Chronic atrial fibrillation, unspecified: Secondary | ICD-10-CM | POA: Diagnosis present

## 2018-09-06 DIAGNOSIS — W19XXXA Unspecified fall, initial encounter: Secondary | ICD-10-CM | POA: Diagnosis present

## 2018-09-06 DIAGNOSIS — L97519 Non-pressure chronic ulcer of other part of right foot with unspecified severity: Secondary | ICD-10-CM | POA: Diagnosis not present

## 2018-09-06 DIAGNOSIS — I517 Cardiomegaly: Secondary | ICD-10-CM | POA: Diagnosis not present

## 2018-09-06 DIAGNOSIS — I169 Hypertensive crisis, unspecified: Secondary | ICD-10-CM | POA: Diagnosis not present

## 2018-09-06 DIAGNOSIS — R079 Chest pain, unspecified: Secondary | ICD-10-CM | POA: Diagnosis not present

## 2018-09-06 DIAGNOSIS — R740 Nonspecific elevation of levels of transaminase and lactic acid dehydrogenase [LDH]: Secondary | ICD-10-CM | POA: Diagnosis present

## 2018-09-06 DIAGNOSIS — Z9189 Other specified personal risk factors, not elsewhere classified: Secondary | ICD-10-CM | POA: Diagnosis not present

## 2018-09-06 DIAGNOSIS — E8809 Other disorders of plasma-protein metabolism, not elsewhere classified: Secondary | ICD-10-CM | POA: Diagnosis present

## 2018-09-06 DIAGNOSIS — R601 Generalized edema: Secondary | ICD-10-CM | POA: Diagnosis not present

## 2018-09-06 DIAGNOSIS — E1122 Type 2 diabetes mellitus with diabetic chronic kidney disease: Secondary | ICD-10-CM | POA: Diagnosis present

## 2018-09-06 DIAGNOSIS — D62 Acute posthemorrhagic anemia: Secondary | ICD-10-CM | POA: Diagnosis not present

## 2018-09-06 DIAGNOSIS — D631 Anemia in chronic kidney disease: Secondary | ICD-10-CM | POA: Diagnosis present

## 2018-09-06 DIAGNOSIS — I5032 Chronic diastolic (congestive) heart failure: Secondary | ICD-10-CM | POA: Diagnosis present

## 2018-09-06 DIAGNOSIS — I480 Paroxysmal atrial fibrillation: Secondary | ICD-10-CM | POA: Diagnosis present

## 2018-09-06 DIAGNOSIS — Z931 Gastrostomy status: Secondary | ICD-10-CM | POA: Diagnosis not present

## 2018-09-06 DIAGNOSIS — N184 Chronic kidney disease, stage 4 (severe): Secondary | ICD-10-CM | POA: Diagnosis present

## 2018-09-06 DIAGNOSIS — R1012 Left upper quadrant pain: Secondary | ICD-10-CM | POA: Diagnosis not present

## 2018-09-06 DIAGNOSIS — D6959 Other secondary thrombocytopenia: Secondary | ICD-10-CM | POA: Diagnosis present

## 2018-09-06 DIAGNOSIS — A4189 Other specified sepsis: Secondary | ICD-10-CM | POA: Diagnosis not present

## 2018-09-06 DIAGNOSIS — G9340 Encephalopathy, unspecified: Secondary | ICD-10-CM | POA: Diagnosis not present

## 2018-09-06 DIAGNOSIS — N183 Chronic kidney disease, stage 3 (moderate): Secondary | ICD-10-CM | POA: Diagnosis not present

## 2018-09-06 DIAGNOSIS — L89312 Pressure ulcer of right buttock, stage 2: Secondary | ICD-10-CM | POA: Diagnosis present

## 2018-09-06 DIAGNOSIS — Z87440 Personal history of urinary (tract) infections: Secondary | ICD-10-CM | POA: Diagnosis not present

## 2018-09-06 DIAGNOSIS — F05 Delirium due to known physiological condition: Secondary | ICD-10-CM | POA: Diagnosis present

## 2018-09-06 DIAGNOSIS — Z66 Do not resuscitate: Secondary | ICD-10-CM | POA: Diagnosis present

## 2018-09-06 DIAGNOSIS — I10 Essential (primary) hypertension: Secondary | ICD-10-CM | POA: Diagnosis not present

## 2018-09-06 DIAGNOSIS — I952 Hypotension due to drugs: Secondary | ICD-10-CM | POA: Diagnosis not present

## 2018-09-06 DIAGNOSIS — I2721 Secondary pulmonary arterial hypertension: Secondary | ICD-10-CM | POA: Diagnosis present

## 2018-09-06 DIAGNOSIS — Z7901 Long term (current) use of anticoagulants: Secondary | ICD-10-CM | POA: Diagnosis not present

## 2018-09-06 DIAGNOSIS — N321 Vesicointestinal fistula: Secondary | ICD-10-CM | POA: Diagnosis not present

## 2018-09-06 DIAGNOSIS — G9341 Metabolic encephalopathy: Secondary | ICD-10-CM | POA: Diagnosis present

## 2018-09-06 DIAGNOSIS — E11649 Type 2 diabetes mellitus with hypoglycemia without coma: Secondary | ICD-10-CM | POA: Diagnosis present

## 2018-09-06 DIAGNOSIS — E032 Hypothyroidism due to medicaments and other exogenous substances: Secondary | ICD-10-CM | POA: Diagnosis not present

## 2018-09-06 DIAGNOSIS — Z9889 Other specified postprocedural states: Secondary | ICD-10-CM | POA: Diagnosis not present

## 2018-09-06 DIAGNOSIS — Z803 Family history of malignant neoplasm of breast: Secondary | ICD-10-CM | POA: Diagnosis not present

## 2018-09-06 DIAGNOSIS — B952 Enterococcus as the cause of diseases classified elsewhere: Secondary | ICD-10-CM | POA: Diagnosis present

## 2018-09-06 DIAGNOSIS — I130 Hypertensive heart and chronic kidney disease with heart failure and stage 1 through stage 4 chronic kidney disease, or unspecified chronic kidney disease: Secondary | ICD-10-CM | POA: Diagnosis present

## 2018-09-06 DIAGNOSIS — Z4659 Encounter for fitting and adjustment of other gastrointestinal appliance and device: Secondary | ICD-10-CM | POA: Diagnosis not present

## 2018-09-06 DIAGNOSIS — I5041 Acute combined systolic (congestive) and diastolic (congestive) heart failure: Secondary | ICD-10-CM | POA: Diagnosis not present

## 2018-09-06 DIAGNOSIS — K859 Acute pancreatitis without necrosis or infection, unspecified: Secondary | ICD-10-CM | POA: Diagnosis not present

## 2018-09-06 DIAGNOSIS — I340 Nonrheumatic mitral (valve) insufficiency: Secondary | ICD-10-CM | POA: Diagnosis not present

## 2018-09-06 DIAGNOSIS — N179 Acute kidney failure, unspecified: Secondary | ICD-10-CM | POA: Diagnosis not present

## 2018-09-06 DIAGNOSIS — Z881 Allergy status to other antibiotic agents status: Secondary | ICD-10-CM

## 2018-09-06 DIAGNOSIS — Z1159 Encounter for screening for other viral diseases: Secondary | ICD-10-CM | POA: Diagnosis not present

## 2018-09-06 DIAGNOSIS — K224 Dyskinesia of esophagus: Secondary | ICD-10-CM | POA: Diagnosis present

## 2018-09-06 DIAGNOSIS — M7989 Other specified soft tissue disorders: Secondary | ICD-10-CM | POA: Diagnosis not present

## 2018-09-06 DIAGNOSIS — I951 Orthostatic hypotension: Secondary | ICD-10-CM | POA: Diagnosis not present

## 2018-09-06 DIAGNOSIS — Z960 Presence of urogenital implants: Secondary | ICD-10-CM | POA: Diagnosis not present

## 2018-09-06 DIAGNOSIS — J984 Other disorders of lung: Secondary | ICD-10-CM | POA: Diagnosis not present

## 2018-09-06 DIAGNOSIS — E162 Hypoglycemia, unspecified: Secondary | ICD-10-CM | POA: Diagnosis not present

## 2018-09-06 DIAGNOSIS — K851 Biliary acute pancreatitis without necrosis or infection: Secondary | ICD-10-CM | POA: Diagnosis present

## 2018-09-06 DIAGNOSIS — B9689 Other specified bacterial agents as the cause of diseases classified elsewhere: Secondary | ICD-10-CM | POA: Diagnosis not present

## 2018-09-06 DIAGNOSIS — R680 Hypothermia, not associated with low environmental temperature: Secondary | ICD-10-CM | POA: Diagnosis not present

## 2018-09-06 DIAGNOSIS — R1310 Dysphagia, unspecified: Secondary | ICD-10-CM | POA: Diagnosis not present

## 2018-09-06 DIAGNOSIS — E876 Hypokalemia: Secondary | ICD-10-CM | POA: Diagnosis not present

## 2018-09-06 DIAGNOSIS — Y92239 Unspecified place in hospital as the place of occurrence of the external cause: Secondary | ICD-10-CM | POA: Diagnosis present

## 2018-09-06 DIAGNOSIS — A4159 Other Gram-negative sepsis: Secondary | ICD-10-CM | POA: Diagnosis present

## 2018-09-06 DIAGNOSIS — I440 Atrioventricular block, first degree: Secondary | ICD-10-CM | POA: Diagnosis not present

## 2018-09-06 DIAGNOSIS — S301XXA Contusion of abdominal wall, initial encounter: Secondary | ICD-10-CM | POA: Diagnosis present

## 2018-09-06 DIAGNOSIS — I2510 Atherosclerotic heart disease of native coronary artery without angina pectoris: Secondary | ICD-10-CM | POA: Diagnosis present

## 2018-09-06 DIAGNOSIS — D649 Anemia, unspecified: Secondary | ICD-10-CM | POA: Diagnosis present

## 2018-09-06 DIAGNOSIS — Z90711 Acquired absence of uterus with remaining cervical stump: Secondary | ICD-10-CM | POA: Diagnosis not present

## 2018-09-06 DIAGNOSIS — A499 Bacterial infection, unspecified: Secondary | ICD-10-CM | POA: Diagnosis not present

## 2018-09-06 DIAGNOSIS — E785 Hyperlipidemia, unspecified: Secondary | ICD-10-CM | POA: Diagnosis present

## 2018-09-06 DIAGNOSIS — I361 Nonrheumatic tricuspid (valve) insufficiency: Secondary | ICD-10-CM | POA: Diagnosis not present

## 2018-09-06 DIAGNOSIS — R42 Dizziness and giddiness: Secondary | ICD-10-CM | POA: Diagnosis present

## 2018-09-06 DIAGNOSIS — I495 Sick sinus syndrome: Secondary | ICD-10-CM | POA: Diagnosis present

## 2018-09-06 DIAGNOSIS — R339 Retention of urine, unspecified: Secondary | ICD-10-CM | POA: Diagnosis not present

## 2018-09-06 DIAGNOSIS — Z79890 Hormone replacement therapy (postmenopausal): Secondary | ICD-10-CM

## 2018-09-06 DIAGNOSIS — R1013 Epigastric pain: Secondary | ICD-10-CM | POA: Diagnosis not present

## 2018-09-06 DIAGNOSIS — I5043 Acute on chronic combined systolic (congestive) and diastolic (congestive) heart failure: Secondary | ICD-10-CM | POA: Diagnosis not present

## 2018-09-06 DIAGNOSIS — E43 Unspecified severe protein-calorie malnutrition: Secondary | ICD-10-CM | POA: Diagnosis present

## 2018-09-06 DIAGNOSIS — L8990 Pressure ulcer of unspecified site, unspecified stage: Secondary | ICD-10-CM | POA: Diagnosis present

## 2018-09-06 DIAGNOSIS — K828 Other specified diseases of gallbladder: Secondary | ICD-10-CM | POA: Diagnosis not present

## 2018-09-06 DIAGNOSIS — R5381 Other malaise: Secondary | ICD-10-CM | POA: Diagnosis not present

## 2018-09-06 DIAGNOSIS — R0602 Shortness of breath: Secondary | ICD-10-CM | POA: Diagnosis not present

## 2018-09-06 DIAGNOSIS — I4891 Unspecified atrial fibrillation: Secondary | ICD-10-CM | POA: Diagnosis not present

## 2018-09-06 DIAGNOSIS — R946 Abnormal results of thyroid function studies: Secondary | ICD-10-CM | POA: Diagnosis present

## 2018-09-06 HISTORY — DX: Atrioventricular block, first degree: I44.0

## 2018-09-06 HISTORY — DX: Bradycardia, unspecified: R00.1

## 2018-09-06 HISTORY — DX: Type 2 diabetes mellitus without complications: E11.9

## 2018-09-06 HISTORY — DX: Anemia, unspecified: D64.9

## 2018-09-06 HISTORY — DX: Unspecified atrial flutter: I48.92

## 2018-09-06 HISTORY — DX: Chronic kidney disease, stage 3 unspecified: N18.30

## 2018-09-06 HISTORY — DX: Hypothyroidism, unspecified: E03.9

## 2018-09-06 HISTORY — DX: Chronic kidney disease, stage 3 (moderate): N18.3

## 2018-09-06 LAB — POCT I-STAT EG7
Acid-base deficit: 1 mmol/L (ref 0.0–2.0)
Bicarbonate: 24.8 mmol/L (ref 20.0–28.0)
Calcium, Ion: 1.25 mmol/L (ref 1.15–1.40)
HCT: 27 % — ABNORMAL LOW (ref 36.0–46.0)
Hemoglobin: 9.2 g/dL — ABNORMAL LOW (ref 12.0–15.0)
O2 Saturation: 96 %
Potassium: 4.7 mmol/L (ref 3.5–5.1)
Sodium: 141 mmol/L (ref 135–145)
TCO2: 26 mmol/L (ref 22–32)
pCO2, Ven: 44.6 mmHg (ref 44.0–60.0)
pH, Ven: 7.353 (ref 7.250–7.430)
pO2, Ven: 88 mmHg — ABNORMAL HIGH (ref 32.0–45.0)

## 2018-09-06 LAB — COMPREHENSIVE METABOLIC PANEL
ALT: 43 U/L (ref 0–44)
AST: 34 U/L (ref 15–41)
Albumin: 3.1 g/dL — ABNORMAL LOW (ref 3.5–5.0)
Alkaline Phosphatase: 91 U/L (ref 38–126)
Anion gap: 11 (ref 5–15)
BUN: 44 mg/dL — ABNORMAL HIGH (ref 8–23)
CO2: 21 mmol/L — ABNORMAL LOW (ref 22–32)
Calcium: 9.5 mg/dL (ref 8.9–10.3)
Chloride: 109 mmol/L (ref 98–111)
Creatinine, Ser: 2.86 mg/dL — ABNORMAL HIGH (ref 0.44–1.00)
GFR calc Af Amer: 17 mL/min — ABNORMAL LOW (ref >60)
GFR calc non Af Amer: 15 mL/min — ABNORMAL LOW (ref >60)
Glucose, Bld: 80 mg/dL (ref 70–99)
Potassium: 4.8 mmol/L (ref 3.5–5.1)
Sodium: 141 mmol/L (ref 135–145)
Total Bilirubin: 0.5 mg/dL (ref 0.3–1.2)
Total Protein: 6.2 g/dL — ABNORMAL LOW (ref 6.5–8.1)

## 2018-09-06 LAB — URINALYSIS, ROUTINE W REFLEX MICROSCOPIC
Bilirubin Urine: NEGATIVE
Glucose, UA: NEGATIVE mg/dL
Ketones, ur: NEGATIVE mg/dL
Nitrite: NEGATIVE
Protein, ur: NEGATIVE mg/dL
Specific Gravity, Urine: 1.012 (ref 1.005–1.030)
pH: 5 (ref 5.0–8.0)

## 2018-09-06 LAB — CBC WITH DIFFERENTIAL/PLATELET
Abs Immature Granulocytes: 0.04 10*3/uL (ref 0.00–0.07)
Basophils Absolute: 0 10*3/uL (ref 0.0–0.1)
Basophils Relative: 0 %
Eosinophils Absolute: 0 10*3/uL (ref 0.0–0.5)
Eosinophils Relative: 0 %
HCT: 28.4 % — ABNORMAL LOW (ref 36.0–46.0)
Hemoglobin: 8.8 g/dL — ABNORMAL LOW (ref 12.0–15.0)
Immature Granulocytes: 1 %
Lymphocytes Relative: 28 %
Lymphs Abs: 1.4 10*3/uL (ref 0.7–4.0)
MCH: 29.8 pg (ref 26.0–34.0)
MCHC: 31 g/dL (ref 30.0–36.0)
MCV: 96.3 fL (ref 80.0–100.0)
Monocytes Absolute: 0.3 10*3/uL (ref 0.1–1.0)
Monocytes Relative: 6 %
Neutro Abs: 3.1 10*3/uL (ref 1.7–7.7)
Neutrophils Relative %: 65 %
Platelets: 186 10*3/uL (ref 150–400)
RBC: 2.95 MIL/uL — ABNORMAL LOW (ref 3.87–5.11)
RDW: 17.3 % — ABNORMAL HIGH (ref 11.5–15.5)
WBC: 4.8 10*3/uL (ref 4.0–10.5)
nRBC: 0.4 % — ABNORMAL HIGH (ref 0.0–0.2)

## 2018-09-06 LAB — GLUCOSE, CAPILLARY
Glucose-Capillary: 65 mg/dL — ABNORMAL LOW (ref 70–99)
Glucose-Capillary: 69 mg/dL — ABNORMAL LOW (ref 70–99)
Glucose-Capillary: 89 mg/dL (ref 70–99)
Glucose-Capillary: 87 mg/dL (ref 70–99)
Glucose-Capillary: 88 mg/dL (ref 70–99)

## 2018-09-06 LAB — TROPONIN I
Troponin I: <0.03 ng/mL (ref <0.03)
Troponin I: <0.03 ng/mL (ref <0.03)

## 2018-09-06 LAB — I-STAT CREATININE, ED: Creatinine, Ser: 2.8 mg/dL — ABNORMAL HIGH (ref 0.44–1.00)

## 2018-09-06 LAB — BRAIN NATRIURETIC PEPTIDE: B Natriuretic Peptide: 192.9 pg/mL — ABNORMAL HIGH (ref 0.0–100.0)

## 2018-09-06 LAB — TSH: TSH: 9.219 u[IU]/mL — ABNORMAL HIGH (ref 0.350–4.500)

## 2018-09-06 LAB — CBG MONITORING, ED: Glucose-Capillary: 80 mg/dL (ref 70–99)

## 2018-09-06 MED ORDER — LEVOTHYROXINE SODIUM 75 MCG PO TABS
75.0000 ug | ORAL_TABLET | Freq: Every day | ORAL | Status: DC
  Administered 2018-09-07 – 2018-09-19 (×12): 75 ug via ORAL
  Filled 2018-09-06 (×13): qty 1

## 2018-09-06 MED ORDER — SODIUM CHLORIDE 0.9 % IV SOLN
Freq: Once | INTRAVENOUS | Status: AC
  Administered 2018-09-06: 16:00:00 via INTRAVENOUS

## 2018-09-06 MED ORDER — ACETAMINOPHEN 325 MG PO TABS
650.0000 mg | ORAL_TABLET | Freq: Four times a day (QID) | ORAL | Status: DC | PRN
  Administered 2018-09-07: 650 mg via ORAL
  Filled 2018-09-06: qty 2

## 2018-09-06 MED ORDER — ATORVASTATIN CALCIUM 10 MG PO TABS
10.0000 mg | ORAL_TABLET | Freq: Every day | ORAL | Status: DC
  Administered 2018-09-07 – 2018-09-19 (×12): 10 mg via ORAL
  Filled 2018-09-06 (×12): qty 1

## 2018-09-06 MED ORDER — CALCIUM CARBONATE ANTACID 500 MG PO CHEW
1.0000 | CHEWABLE_TABLET | Freq: Four times a day (QID) | ORAL | Status: DC | PRN
  Administered 2018-09-06: 400 mg via ORAL
  Filled 2018-09-06: qty 2

## 2018-09-06 MED ORDER — CALCIUM CARBONATE-VITAMIN D 500-200 MG-UNIT PO TABS
ORAL_TABLET | Freq: Two times a day (BID) | ORAL | Status: DC
  Administered 2018-09-06 – 2018-09-09 (×5): 1 via ORAL
  Administered 2018-09-10: 10:00:00 via ORAL
  Administered 2018-09-10 – 2018-09-19 (×18): 1 via ORAL
  Filled 2018-09-06 (×25): qty 1

## 2018-09-06 MED ORDER — APIXABAN 2.5 MG PO TABS
2.5000 mg | ORAL_TABLET | Freq: Two times a day (BID) | ORAL | Status: DC
  Administered 2018-09-06: 21:00:00 2.5 mg via ORAL
  Filled 2018-09-06: qty 1

## 2018-09-06 MED ORDER — OXYBUTYNIN CHLORIDE 5 MG PO TABS
5.0000 mg | ORAL_TABLET | Freq: Every day | ORAL | Status: DC
  Administered 2018-09-07 – 2018-09-17 (×10): 5 mg via ORAL
  Filled 2018-09-06 (×10): qty 1

## 2018-09-06 MED ORDER — PROMETHAZINE HCL 25 MG PO TABS
12.5000 mg | ORAL_TABLET | Freq: Four times a day (QID) | ORAL | Status: DC | PRN
  Filled 2018-09-06: qty 1

## 2018-09-06 MED ORDER — SENNOSIDES-DOCUSATE SODIUM 8.6-50 MG PO TABS
1.0000 | ORAL_TABLET | Freq: Every evening | ORAL | Status: DC | PRN
  Administered 2018-09-06: 23:00:00 1 via ORAL
  Filled 2018-09-06: qty 1

## 2018-09-06 MED ORDER — LEVOTHYROXINE SODIUM 75 MCG PO TABS: 75.0000 ug | ORAL_TABLET | Freq: Every day | ORAL | Status: DC

## 2018-09-06 MED ORDER — ACETAMINOPHEN 650 MG RE SUPP: 650.0000 mg | Freq: Four times a day (QID) | RECTAL | Status: DC | PRN

## 2018-09-06 MED ORDER — SODIUM CHLORIDE 0.9% FLUSH
3.0000 mL | Freq: Once | INTRAVENOUS | Status: AC
  Administered 2018-09-06: 3 mL via INTRAVENOUS

## 2018-09-06 NOTE — Progress Notes (Addendum)
Subjective: Paged by RN that patient's temperature was 96 and patient was endorsing abdominal pain. The RN reports that she is unable to get another temperature reading because the thermometer won't pick up a temperature in the patient, however it is working on others. This is the first temperature reading the patient has had since arriving to the hospital. The patient has been covered in blankets with heating packs.   I evaluated the patient at bedside. She reports that she feels warmer since the blankets were placed. She reports epigastric abdominal pain that started 12 hours ago and is 7/10. She feels the need to burp and to have a bowel movement. Her last bowel movement was yesterday, but it was small. She reports a history of constipation with infrequent bowel movements. However, this pain is new. She denies a history of GERD. She denies nausea or vomiting.    Objective: VS: Satting well on room air, bradycardic in the 40s, BP 105/54 Gen: Alert and oriented x3. She is lying comfortably in bed, no distress. Abd: Bowel sounds present. Soft, mildly distended in the suprapubic region, mild ttp in the epigastric region.  Ext: Extremities feel warm.   Assessment Hypothermia: Likely 2/2 hypothyroidism. Her synthroid dose has already been increased. Infection is also a possibility. She has no obvious infectious source except possibly her urine. UA earlier today showed small leukocytes and small bacteria. Urine culture is pending.  - Continue external warming with blankets and heating pads - Trend temperature  - Blood cultures - F/u urine cx - T4 - Cortisol  Hypoglycemia: Blood sugar ranging in the 60s-80s. She is tolerating PO. May be 2/2 hypothyroidism or infection.  - CBG q4hrs  Epigastric pain: Low suspicion for ACS as pain was present prior to admission, no ischemic changes on EKG, and negative troponin x2. However, as patient has been bradycardic, will repeat an EKG and continue to trend  troponins. No suspicion for SBO as bowel sounds are present, no n/v, and last BM yesterday. Most likely heartburn and/or constipation in the setting of decreased bowel motility with hypothyroidism. Patient has had 50cc urinary output since admission. She does have suprapubic distension, will get bladder scan to rule out urinary retention. Of note, patient declines any liquid therapies like a GI cocktail or miralax at this time.  - Repeat EKG - Tums  - Senokot - Bladder scan   Addendum - Bladder scan showed 425ml. Ordered in and out cath.  - EKG without ischemic changes.

## 2018-09-06 NOTE — Consult Note (Addendum)
Cardiology Consultation:   Patient ID: Maureen Stout; [id number]; 23-Feb-1932   Admit date: 09/06/2018 Date of Consult: 09/06/2018  Primary Care Provider: Nicoletta Dress, MD Primary Cardiologist: Shirlee More, MD Primary Electrophysiologist:  None  Chief Complaint: weakness  Patient Profile:   Maureen Stout is a 85 y.o. female with a hx of paroxysmal atrial fibrillation, paroxysmal atrial flutter, sinus bradycardia, CKD stage III, DM, dyslipidemia, HTN who is being seen today for the evaluation of sinus bradycardia at the request of Dr. Maricela Bo.  History of Present Illness:   Maureen Stout was hospitalized in 2018 for new onset atrial fibrillation and converted with Cardizem. She was placed on Eliquis and metoprolol initially. She had an event monitor placed 04/2017 that showed NSR with frequent PACs, minimum HR 49, mean HR 107, maximum HR 152bpm. She had atrial fib burden with <1% with 3 episodes of rapid atrial rhythm 1 of which appears to be atrial flutter 2 1 conduction at a rate of 194 bpm and 2 episodes of rapid atrial fibrillation. She saw the atrial fib clinic who was concerned given that she lives alone and drives that she would become symptomatic with these episodes so amiodarone was started. 2D echo 04/2017 showed mild LVH, EF 60-65%, grade 1 DD. In 12/2017 she was noted to have a HR of 46 and her metoprolol was cut to 25mg  BID with f/u HR in the 50s. Per Dr. Doug Sou report she is a vague historian.  She came to the hospital because she was "nervous" which is an answer to several of the questions asked of her. She was found to be in sinus bradycardia with HR in the 30s. It sounds like she was weak and dizzy. She also had a fall sometime last week but doesn't really recall many other details. She has had mild exertional dyspnea and some R hand swelling, although has an ACE wrap on R arm that appears to be causing some venous compression. No chest pain. Her labs reveal worsening AKI on  CKD with BUN 44 and Cr 2.86 (Cr 1.32 in 04/2018 and Hgb 11.8 in 04/2017).    Past Medical History:  Diagnosis Date  . Bladder problem   . CKD (chronic kidney disease), stage III ([place])   . Diabetes mellitus ([place])   . Dyslipidemia   . Hypertension   . Paroxysmal A-fib ([place])   . Paroxysmal atrial flutter ([place])   . Sinus bradycardia     Past Surgical History:  Procedure Laterality Date  . PARTIAL HYSTERECTOMY    . TOE SURGERY    . TONSILLECTOMY       Inpatient Medications: Scheduled Meds: . apixaban  2.5 mg Oral BID  . [START ON 09/07/2018] atorvastatin  10 mg Oral Daily  . calcium-vitamin D   Oral BID  . [START ON 09/07/2018] levothyroxine  75 mcg Oral QAC breakfast  . [START ON 09/07/2018] oxybutynin  5 mg Oral Daily   Continuous Infusions:  PRN Meds: acetaminophen **OR** acetaminophen, promethazine, senna-docusate  Home Meds: Prior to Admission medications   Medication Sig Start Date End Date Taking? Authorizing Provider  allopurinol (ZYLOPRIM) 100 MG tablet Take 100 mg by mouth at bedtime.  11/05/14  Yes [provider]  amiodarone (PACERONE) 200 MG tablet TAKE 1 TABLET EVERY DAY Patient taking differently: Take 200 mg by mouth daily.  08/08/18  Yes Richardo Priest, MD  amLODipine (NORVASC) 5 MG tablet Take 5 mg by mouth daily.   Yes [provider]  ampicillin (PRINCIPEN) 500 MG capsule Take 500 mg by mouth 4 (four) times daily. For 22 days 07/27/18  Yes [provider]  atorvastatin (LIPITOR) 10 MG tablet Take 10 mg by mouth daily.   Yes [provider]  Calcium Carbonate-Vitamin D (CALCIUM-D PO) Take 1 tablet by mouth daily.    Yes [provider]  ELIQUIS 2.5 MG TABS tablet TAKE 1 TABLET TWICE DAILY Patient taking differently: Take 2.5 mg by mouth 2 (two) times daily.  07/06/18  Yes Richardo Priest, MD  levothyroxine (SYNTHROID, LEVOTHROID) 50 MCG tablet Take 50 mcg by mouth daily before breakfast.  03/29/18  Yes [provider]  metoprolol tartrate (LOPRESSOR) 25 MG tablet Take 1 tablet (25 mg total) by mouth 2 (two) times daily. 01/10/18 04/12/20 Yes Richardo Priest, MD  Multiple Vitamin (MULTI-VITAMINS) TABS Take 1 tablet by mouth daily.    Yes [provider]  niacin 500 MG CR capsule Take 500 mg by mouth daily.   Yes [provider]  Omega-3 Fatty Acids (FISH OIL) 1200 MG CAPS Take 1,200 mg by mouth 2 (two) times daily.   Yes [provider]  oxybutynin (DITROPAN) 5 MG tablet  06/15/18  Yes [provider]  silver sulfADIAZINE (SILVADENE) 1 % cream Apply 1 application topically daily. To foot ulcers Patient taking differently: Apply 1 application topically daily as needed (foot ulcers).  11/11/17  Yes Landis Martins, DPM  Wound Dressings ([place] WOUND/BURN DRESSING) GEL Apply a small amount to wounds daily 04/27/18  Yes Landis Martins, DPM    Allergies:    Allergies  Allergen Reactions  . Vancomycin Rash    Social History:   Social History   Socioeconomic History  . Marital status: Married    Spouse name: Not on file  . Number of children: Not on file  . Years of education: Not on file  . Highest education level: Not on file  Occupational History  . Not on file  Social Needs  . Financial resource strain: Not on file  . Food insecurity:    Worry: Not on file    Inability: Not on file  . Transportation needs:    Medical: Not on file    Non-medical: Not on file  Tobacco Use  . Smoking status: Never Smoker  . Smokeless tobacco: Never Used  Substance and Sexual Activity  . Alcohol use: No  . Drug use: No  . Sexual activity: Not on file  Lifestyle  . Physical activity:    Days per week: Not on file    Minutes per session: Not on file  . Stress: Not on file  Relationships  . Social connections:    Talks on phone: Not on file    Gets together: Not on file    Attends religious service: Not on file    Active member of club or organization:  Not on file    Attends meetings of clubs or organizations: Not on file    Relationship status: Not on file  . Intimate partner violence:    Fear of current or ex partner: Not on file    Emotionally abused: Not on file    Physically abused: Not on file    Forced sexual activity: Not on file  Other Topics Concern  . Not on file  Social History Narrative  . Not on file    Family History:   The patient's family history includes Breast cancer in her sister; Cancer in  her father. There is no history of Diabetes or Heart attack.  ROS:  Please see the history of present illness.  All other ROS reviewed and negative.     Physical Exam/Data:   Vitals:   09/06/18 1700 09/06/18 1730 09/06/18 1800 09/06/18 1801  BP: (!) 116/58 (!) 109/50 (!) 124/56 (!) 124/56  Pulse: (!) 35 (!) 35 (!) 37 (!) 36  Resp: 12  12 10   SpO2: 93% 95% 95%   Weight:    81.2 kg  Height:    5\' 6"  (1.676 m)    Intake/Output Summary (Last 24 hours) at 09/06/2018 1920 Last data filed at 09/06/2018 1900 Gross per 24 hour  Intake 121 ml  Output -  Net 121 ml   Last 3 Weights 09/06/2018 09/06/2018 04/12/2018  Weight (lbs) 179 lb 1.6 oz 180 lb 182 lb 9.6 oz  Weight (kg) 81.239 kg 81.647 kg 82.827 kg    Body mass index is 28.91 kg/m.  Exam per MD: General: Elderly WF in no acute distress. Lying supine Head: Normocephalic, atraumatic, sclera non-icteric, no xanthomas, nares are without discharge.  Neck: Negative for carotid bruits. JVD not elevated. Lungs: Clear bilaterally to auscultation without wheezes, rales, or rhonchi. Breathing is unlabored. Heart: Reg rhythm, bradycardic, with S1 S2. No murmurs, rubs, or gallops appreciated. Abdomen: Soft, non-tender, non-distended with normoactive bowel sounds. No hepatomegaly. No rebound/guarding. No obvious abdominal masses. Msk:  Strength and tone appear normal for age. Extremities: No clubbing or cyanosis. Obese lower extremities without significant LE edema, + R hand  edema distal to ACE wrap.  Distal pedal pulses are 2+ and equal bilaterally. Neuro: Alert and oriented X 3 although vague with answers. No facial asymmetry. No focal deficit. Moves all extremities spontaneously. Psych:  Normal affect.  EKG:  The EKG was personally reviewed and demonstrates sinus bradycardia 36bpm NSIVCD nonspecific STT changes  Laboratory Data:  Chemistry Recent Labs  Lab 09/06/18 1424 09/06/18 1443 09/06/18 1444  NA 141  --  141  K 4.8  --  4.7  CL 109  --   --   CO2 21*  --   --   GLUCOSE 80  --   --   BUN 44*  --   --   CREATININE 2.86* 2.80*  --   CALCIUM 9.5  --   --   GFRNONAA 15*  --   --   GFRAA 17*  --   --   ANIONGAP 11  --   --     Recent Labs  Lab 09/06/18 1424  PROT 6.2*  ALBUMIN 3.1*  AST 34  ALT 43  ALKPHOS 91  BILITOT 0.5   Hematology Recent Labs  Lab 09/06/18 1424 09/06/18 1444  WBC 4.8  --   RBC 2.95*  --   HGB 8.8* 9.2*  HCT 28.4* 27.0*  MCV 96.3  --   MCH 29.8  --   MCHC 31.0  --   RDW 17.3*  --   PLT 186  --    Cardiac Enzymes Recent Labs  Lab 09/06/18 1424  TROPONINI <0.03   No results for input(s): TROPIPOC in the last 168 hours.  BNPNo results for input(s): BNP, PROBNP in the last 168 hours.  DDimer No results for input(s): DDIMER in the last 168 hours.  Radiology/Studies:  Dg Chest Port 1 View  Result Date: 09/06/2018 CLINICAL DATA:  Dizziness, bradycardia, and weakness. EXAM: PORTABLE CHEST 1 VIEW COMPARISON:  Chest x-ray dated April 18, 2017. FINDINGS: Stable  cardiomegaly. Normal mediastinal contours. Normal pulmonary vascularity. Mild left greater than right basilar atelectasis. Possible trace left pleural effusion. No consolidation or pneumothorax. No acute osseous abnormality. IMPRESSION: 1. Possible trace left pleural effusion. Mild bibasilar atelectasis. 2. Stable cardiomegaly. Electronically Signed   By: Titus Dubin M.D.   On: 09/06/2018 14:44    Assessment and Plan:   1. Weakness - felt likely  multifactorial. Bradycardia is likely contributing. Her anemia will also require workup given chronic anticoagulation.  2. Sinus bradycardia with HR in the 30s, superimposed on history of paroxysmal atrial fib and flutter - she likely has some component of tachy-brady syndrome but fortunately had a relatively low burden of atrial arrhythmias on event monitor back in 2018. She also had prior HRs in the 40s in the office. Would stop amiodarone and metoprolol for now and observe. Amiodarone has a long half life and will take some time to wash out, so we may not re-add any AVN blockers in the immediate coming days. It might be reasonable to re-add low dose metoprolol if her HR comes up and observe the response. She is not currently hemodynamically unstable so Dr. Martinique did not feel there was any acute need to consider either temporary or permanent pacemaker.   3. AKI superimposed on CKD stage III - unclear etiology, per IM. May be exacerbated by #2 but her acute anemia raises question whether there could be a unifying process. Was given IVF. No obvious nephrotoxins by home meds.  4. Worsening acute anemia - further workup per primary team. She is continued on Eliquis for now. Since she is maintaining NSR by way of sinus bradycardia, OK to pause therapy if felt necessary by primary team. Follow.  5. Essential HTN - agree with holding amlodipine for now in addition to the above, and allowing permissive HTN while HR is low.  6. Hypothyroidism - can exacerbate bradycardia as well. Consider adjustment of treatment regimen.   7. Fall - will need to consider PT as hospitalization progresses since pt is documented to live alone. May need some Home Health Resources.  For questions or updates, please contact [place] Please consult [email or web address] for contact info under Cardiology/STEMI.    Signed, Charlie Pitter, PA-C - initial H/P was prepped remotely to decrease total number of staff patient was exposed  to during hospital stay. Note was scribed based on conversation with MD who performed history and physical exam in person. 09/06/2018 7:20 PM   Patient seen and examined and history reviewed. Agree with above findings and plan. 85 yo WF seen for evaluation of bradycardia. History and physical obtained personally. She is a poor historian. States she came to this hospital because she got "nervous". Does admit to weakness and dizziness. No syncope. Some DOE. No chest pain.   Exam as noted above.  Impression 1. Marked sinus bradycardia- most likely related to medication including amiodarone and beta blocker. Exacerbated by other medical conditions including hypothyroidism. 2. Acute on chronic renal failure. 3. Anemia 4. History of paroxysmal Afib. 5. Hypothyroidism  Plan: hold beta blocker and amiodarone. I would probably not resume amiodarone in the future given low burden before. Anemia and renal failure being addressed by primary team. There is no current indication for either temporary or permanent pacemaker. Will follow.   Peter Martinique, [place] 09/06/2018 7:52 PM

## 2018-09-06 NOTE — Progress Notes (Signed)
Patient transferred from ED at 1750hrs.  HR in 30s, SB on arrival.  TCP pad on anterior and posteriorly.  Oriented to unit.  Fall precautions initiated.

## 2018-09-06 NOTE — ED Triage Notes (Signed)
Pt arrives EMS from home with c/o weakness and dizziness since yesterday. EMS notes afib with rate30's. Denies chest pain or SHOB. Alert and oriented  700 cc ns PTA

## 2018-09-06 NOTE — Progress Notes (Signed)
Hypoglycemic Event  CBG: 65  Treatment:patient taking PO, given juice  Symptoms: denies symptoms  Follow-up CBG: Time 1920hrs  CBG Result 69 given PO juice.   Possible Reasons for Event: poor intake Comments/MD notified Dr, Alfonse Spruce notified. Night shift RN to f/u in 15 min.     Myrtis Hopping

## 2018-09-06 NOTE — Progress Notes (Addendum)
RN attempted to obtain axillary and oral temperature but thermometer not registering.  Unable to obtain rectal temperature due to hospital being out of rectal thermometers.  Patient also complaining of abdominal pain 7/10, stating "it just hurts" when asked to describe pain.  Bowel sounds hypoactive and patient stated she has not been passing gas today but did have a bowel movement yesterday.  RN text paged [facility] with this information.

## 2018-09-06 NOTE — ED Notes (Signed)
Pt CBG was 80, notified Millie(RN)

## 2018-09-06 NOTE — Progress Notes (Signed)
Dr. Alfonse Spruce notified of patient HR 36 afib on monitor per standing orders, no new orders at this time. Dr. Martinique here to see patient.

## 2018-09-06 NOTE — Progress Notes (Signed)
Bladder scan volume 449mL and EKG completed.  [facility] text paged with this information.

## 2018-09-06 NOTE — ED Notes (Addendum)
ED TO INPATIENT HANDOFF REPORT  ED Nurse Name and Phone #:  Jenny Reichmann [phone]  S Name/Age/Gender Maureen Stout 85 y.o. female Room/Bed: 031C/031C  Code Status   Code Status: Full Code  Home/SNF/Other Home Patient oriented to: self, place, time and situation Is this baseline? Yes   Triage Complete: Triage complete  Chief Complaint hip pain  Triage Note Pt arrives EMS from home with c/o weakness and dizziness since yesterday. EMS notes afib with rate30's. Denies chest pain or SHOB. Alert and oriented  700 cc ns PTA   Allergies Allergies  Allergen Reactions  . Vancomycin Rash    Level of Care/Admitting Diagnosis ED Disposition    ED Disposition Condition [facility]: [facility] [[id number]]  Level of Care: Telemetry Cardiac [103]  Covid Evaluation: N/A  Diagnosis: Bradycardia [572620]  Admitting Physician: Lucious Groves [[id number]]  Attending Physician: HOFFMAN, ERIK C [[id number]]  PT Class (Do Not Modify): Observation [104]  PT Acc Code (Do Not Modify): Observation [10022]       B Medical/Surgery History Past Medical History:  Diagnosis Date  . Arrhythmia   . Bladder problem   . Diabetes mellitus without complication ([place])   . Dyslipidemia   . Hypertension   . Paroxysmal A-fib [facility])    Past Surgical History:  Procedure Laterality Date  . PARTIAL HYSTERECTOMY    . TOE SURGERY    . TONSILLECTOMY       A IV Location/Drains/Wounds Patient Lines/Drains/Airways Status   Active Line/Drains/Airways    Name:   Placement date:   Placement time:   Site:   Days:   Peripheral IV 09/06/18 Right Antecubital   09/06/18    1409    Antecubital   less than 1   External Urinary Catheter   04/18/17    1626    -   506   Pressure Injury 04/18/17 Deep Tissue Injury - Purple or maroon localized area of discolored intact skin or blood-filled blister due to damage of underlying soft tissue from pressure and/or shear.   04/18/17    2030     506           Intake/Output Last 24 hours  Intake/Output Summary (Last 24 hours) at 09/06/2018 1654 Last data filed at 09/06/2018 1432 Gross per 24 hour  Intake 3 ml  Output -  Net 3 ml    Labs/Imaging Results for orders placed or performed during the hospital encounter of 09/06/18 (from the past 48 hour(s))  Comprehensive metabolic panel     Status: Abnormal   Collection Time: 09/06/18  2:24 PM  Result Value Ref Range   Sodium 141 135 - 145 mmol/L   Potassium 4.8 3.5 - 5.1 mmol/L   Chloride 109 98 - 111 mmol/L   CO2 21 (L) 22 - 32 mmol/L   Glucose, Bld 80 70 - 99 mg/dL   BUN 44 (H) 8 - 23 mg/dL   Creatinine, Ser 2.86 (H) 0.44 - 1.00 mg/dL   Calcium 9.5 8.9 - 10.3 mg/dL   Total Protein 6.2 (L) 6.5 - 8.1 g/dL   Albumin 3.1 (L) 3.5 - 5.0 g/dL   AST 34 15 - 41 U/L   ALT 43 0 - 44 U/L   Alkaline Phosphatase 91 38 - 126 U/L   Total Bilirubin 0.5 0.3 - 1.2 mg/dL   GFR calc non Af Amer 15 (L) >60 mL/min   GFR calc Af Amer 17 (L) >60 mL/min  Anion gap 11 5 - 15    Comment: Performed at [facility] [address] [place], [place] [address]  CBC with Differential     Status: Abnormal   Collection Time: 09/06/18  2:24 PM  Result Value Ref Range   WBC 4.8 4.0 - 10.5 K/uL   RBC 2.95 (L) 3.87 - 5.11 MIL/uL   Hemoglobin 8.8 (L) 12.0 - 15.0 g/dL   HCT 28.4 (L) 36.0 - 46.0 %   MCV 96.3 80.0 - 100.0 fL   MCH 29.8 26.0 - 34.0 pg   MCHC 31.0 30.0 - 36.0 g/dL   RDW 17.3 (H) 11.5 - 15.5 %   Platelets 186 150 - 400 K/uL   nRBC 0.4 (H) 0.0 - 0.2 %   Neutrophils Relative % 65 %   Neutro Abs 3.1 1.7 - 7.7 K/uL   Lymphocytes Relative 28 %   Lymphs Abs 1.4 0.7 - 4.0 K/uL   Monocytes Relative 6 %   Monocytes Absolute 0.3 0.1 - 1.0 K/uL   Eosinophils Relative 0 %   Eosinophils Absolute 0.0 0.0 - 0.5 K/uL   Basophils Relative 0 %   Basophils Absolute 0.0 0.0 - 0.1 K/uL   Immature Granulocytes 1 %   Abs Immature Granulocytes 0.04 0.00 - 0.07 K/uL    Comment: Performed at [facility], [address] [place], [place] [address]  Troponin I - Once     Status: None   Collection Time: 09/06/18  2:24 PM  Result Value Ref Range   Troponin I <0.03 <0.03 ng/mL    Comment: Performed at [facility] [address], [place], [place] [address]  CBG monitoring, ED     Status: None   Collection Time: 09/06/18  2:25 PM  Result Value Ref Range   Glucose-Capillary 80 70 - 99 mg/dL   Comment 1 Notify RN    Comment 2 Document in Chart   TSH     Status: Abnormal   Collection Time: 09/06/18  2:26 PM  Result Value Ref Range   TSH 9.219 (H) 0.350 - 4.500 uIU/mL    Comment: Performed by a 3rd Generation assay with a functional sensitivity of <=0.01 uIU/mL. Performed at [facility], [place] [address] [place], [place] [address]   I-stat Creatinine, ED     Status: Abnormal   Collection Time: 09/06/18  2:43 PM  Result Value Ref Range   Creatinine, Ser 2.80 (H) 0.44 - 1.00 mg/dL  POCT I-Stat EG7     Status: Abnormal   Collection Time: 09/06/18  2:44 PM  Result Value Ref Range   pH, Ven 7.353 7.250 - 7.430   pCO2, Ven 44.6 44.0 - 60.0 mmHg   pO2, Ven 88.0 (H) 32.0 - 45.0 mmHg   Bicarbonate 24.8 20.0 - 28.0 mmol/L   TCO2 26 22 - 32 mmol/L   O2 Saturation 96.0 %   Acid-base deficit 1.0 0.0 - 2.0 mmol/L   Sodium 141 135 - 145 mmol/L   Potassium 4.7 3.5 - 5.1 mmol/L   Calcium, Ion 1.25 1.15 - 1.40 mmol/L   HCT 27.0 (L) 36.0 - 46.0 %   Hemoglobin 9.2 (L) 12.0 - 15.0 g/dL   Patient temperature HIDE    Sample type VENOUS    Dg Chest Port 1 View  Result Date: 09/06/2018 CLINICAL DATA:  Dizziness, bradycardia, and weakness. EXAM: PORTABLE CHEST 1 VIEW COMPARISON:  Chest x-ray dated April 18, 2017. FINDINGS: Stable cardiomegaly. Normal  mediastinal contours. Normal pulmonary vascularity. Mild left greater than right basilar atelectasis. Possible trace left pleural effusion. No consolidation or pneumothorax. No acute osseous abnormality. IMPRESSION: 1. Possible trace left pleural  effusion. Mild bibasilar atelectasis. 2. Stable cardiomegaly. Electronically Signed   By: Titus Dubin M.D.   On: 09/06/2018 14:44    Pending Labs Unresulted Labs (From admission, onward)    Start     Ordered   09/07/18 7062  Basic metabolic panel  Tomorrow morning,   R     09/06/18 1644   09/07/18 0500  CBC  Tomorrow morning,   R     09/06/18 1644   09/06/18 1426  Urine culture  ONCE - STAT,   STAT     09/06/18 1425   09/06/18 1414  Urinalysis, Routine w reflex microscopic  Once,   STAT     09/06/18 1414          Vitals/Pain Today's Vitals   09/06/18 1600 09/06/18 1615 09/06/18 1628 09/06/18 1630  BP: (!) 120/58 108/60  (!) 120/57  Pulse: (!) 35 (!) 36  (!) 36  Resp: 14 12 13 15   SpO2: 97% 93% 96% 93%  Weight:      Height:      PainSc:   0-No pain     Isolation Precautions No active isolations  Medications Medications  atorvastatin (LIPITOR) tablet 10 mg (has no administration in time range)  Calcium-D 600-400 MG-UNIT TABS (has no administration in time range)  apixaban (ELIQUIS) tablet 2.5 mg (has no administration in time range)  levothyroxine (SYNTHROID) tablet 75 mcg (has no administration in time range)  acetaminophen (TYLENOL) tablet 650 mg (has no administration in time range)    Or  acetaminophen (TYLENOL) suppository 650 mg (has no administration in time range)  senna-docusate (Senokot-S) tablet 1 tablet (has no administration in time range)  promethazine (PHENERGAN) tablet 12.5 mg (has no administration in time range)  sodium chloride flush (NS) 0.9 % injection 3 mL (3 mLs Intravenous Given 09/06/18 1432)  0.9 %  sodium chloride infusion ( Intravenous New Bag/Given 09/06/18 1628)    Mobility walks High fall risk   Focused Assessments Cardiac Assessment Handoff:  Cardiac Rhythm: Atrial fibrillation Lab Results  Component Value Date   TROPONINI <0.03 09/06/2018   No results found for: DDIMER Does the Patient currently have chest pain? No      R Recommendations: See Admitting Provider Note  Report given to:   Additional Notes:   2+ pitting edema to BLE, RUE No assymmetry of facial muscles.   5/5 strength in all four extremities.    She denies any fevers, chest pain, shortness of breath, nausea, vomiting, abdominal pain, dysuria. She does have progressive lower extremity as well as upper extremity edema.

## 2018-09-06 NOTE — H&P (Signed)
Date: 09/06/2018               Patient Name:  Maureen Stout MRN: [id number]  DOB: Apr 13, 1932 Age / Sex: 85 y.o., female   PCP: Nicoletta Dress, MD         Medical Service: Internal Medicine Teaching Service         Attending Physician: Dr. Quintella Reichert, MD    First Contact: Dr. Nita Sickle Pager: [phone]  Second Contact: Dr. Lars Mage Pager: [phone]       After Hours (After 5p/  First Contact Pager: [phone]  weekends / holidays): Second Contact Pager: [phone]   Chief Complaint: Dizziness and weakness  History of Present Illness: 85 year old female with diabetes mellitus, paroxysmal A. fib, hypertension who presents with dizziness and weakness for the past week.  She states that for the last week she has had progressive upper and lower extremity edema with associated weakness and dizziness.  She endorses palpitations with exerting herself.  She also reports falling several times in the past week but denies hitting her head.  She told her niece this morning about the dizziness and they became very concerned and recommended that she come to the ED.    Additionally she has increased urinary frequency but does state that this is been a chronic issue.  She denies hematuria and dysuria.  She denies shortness of breath, chest pain, nausea, vomiting, abdominal pain.  She reports taking both amiodarone and metoprolol but she is unsure likely how much she takes of each.  He does states that she takes all of her medications as prescribed.  In the ED she was found to have bradycardia with heart rate in to the 30s with otherwise unremarkable vital signs.  Labs were significant for elevated creatinine of 2.8 (baseline appears to be around 1-1.4), elevated TSH of 9.2, normocytic anemia with hemoglobin 8.8 (last Hb 11.8 on 4/21). She was given IV fluids and MTS was consulted for admission.   Meds:  Current Meds  Medication Sig  . allopurinol (ZYLOPRIM) 100 MG tablet Take 100 mg by  mouth at bedtime.   Marland Kitchen amiodarone (PACERONE) 200 MG tablet TAKE 1 TABLET EVERY DAY (Patient taking differently: Take 200 mg by mouth daily. )  . amLODipine (NORVASC) 5 MG tablet Take 5 mg by mouth daily.  Marland Kitchen ampicillin (PRINCIPEN) 500 MG capsule Take 500 mg by mouth 4 (four) times daily. For 22 days  . atorvastatin (LIPITOR) 10 MG tablet Take 10 mg by mouth daily.  . Calcium Carbonate-Vitamin D (CALCIUM-D PO) Take 1 tablet by mouth daily.   Marland Kitchen ELIQUIS 2.5 MG TABS tablet TAKE 1 TABLET TWICE DAILY (Patient taking differently: Take 2.5 mg by mouth 2 (two) times daily. )  . levothyroxine (SYNTHROID, LEVOTHROID) 50 MCG tablet Take 50 mcg by mouth daily before breakfast.   . metoprolol tartrate (LOPRESSOR) 25 MG tablet Take 1 tablet (25 mg total) by mouth 2 (two) times daily.  . Multiple Vitamin (MULTI-VITAMINS) TABS Take 1 tablet by mouth daily.   . niacin 500 MG CR capsule Take 500 mg by mouth daily.  . Omega-3 Fatty Acids (FISH OIL) 1200 MG CAPS Take 1,200 mg by mouth 2 (two) times daily.  Marland Kitchen oxybutynin (DITROPAN) 5 MG tablet   . silver sulfADIAZINE (SILVADENE) 1 % cream Apply 1 application topically daily. To foot ulcers (Patient taking differently: Apply 1 application topically daily as needed (foot ulcers). )  . Wound Dressings (MEDIHONEY WOUND/BURN DRESSING) GEL  Apply a small amount to wounds daily     Allergies: Allergies as of 09/06/2018 - Review Complete 09/06/2018  Allergen Reaction Noted  . Vancomycin Rash 08/04/2017   Past Medical History:  Diagnosis Date  . Arrhythmia   . Bladder problem   . Diabetes mellitus without complication ([place])   . Dyslipidemia   . Hypertension   . Paroxysmal A-fib ([facility])     Family History:  Family History  Problem Relation Age of Onset  . Cancer Father   . Breast cancer Sister   . Diabetes Neg Hx   . Heart attack Neg Hx     Social History:  Social History   Socioeconomic History  . Marital status: Married    Spouse name: Not on file  .  Number of children: Not on file  . Years of education: Not on file  . Highest education level: Not on file  Occupational History  . Not on file  Social Needs  . Financial resource strain: Not on file  . Food insecurity:    Worry: Not on file    Inability: Not on file  . Transportation needs:    Medical: Not on file    Non-medical: Not on file  Tobacco Use  . Smoking status: Never Smoker  . Smokeless tobacco: Never Used  Substance and Sexual Activity  . Alcohol use: No  . Drug use: No  . Sexual activity: Not on file  Lifestyle  . Physical activity:    Days per week: Not on file    Minutes per session: Not on file  . Stress: Not on file  Relationships  . Social connections:    Talks on phone: Not on file    Gets together: Not on file    Attends religious service: Not on file    Active member of club or organization: Not on file    Attends meetings of clubs or organizations: Not on file    Relationship status: Not on file  . Intimate partner violence:    Fear of current or ex partner: Not on file    Emotionally abused: Not on file    Physically abused: Not on file    Forced sexual activity: Not on file  Other Topics Concern  . Not on file  Social History Narrative  . Not on file    Review of Systems: A complete ROS was negative except as per HPI.   Physical Exam: Blood pressure (!) 113/59, pulse (!) 35, resp. rate 11, height 5\' 6"  (1.676 m), weight 81.6 kg, SpO2 94 %. General: Lying in bed in no acute distress HEENT: Normocephalic and atraumatic Cardiovascular: Bradycardic into the 30s, normal rhythm, no murmurs appreciated Respiratory: Clear to auscultation bilaterally, normal work of breathing Abdominal: Normoactive bowel sounds, nontender palpation, no masses appreciated Skin: Dry and warm MSK: nonpitting edema of the upper amities (right more than left) and lower extremities bilaterally. Psych: Normal affect, behavior, mood  EKG: personally reviewed my  interpretation is sinus bradycardia with degree AV block.  CXR: personally reviewed my interpretation is stable cardiomegaly, clear lungs without signs of infiltrate or pleural effusion.  Assessment & Plan by Problem: Active Problems:   Sinus bradycardia   Hypothyroidism   First degree AV block  Ms. Mckinney is an 85 year old female with T2DM, paroxysmal A. fib, hypertension who presents with symptomatic bradycardia with associated dizziness and weakness for the past week.  Differential includes uncontrolled hypothyroidism in and medication use (on both amiodarone and metoprolol  for A. fib).  She also has signs of fluid overload including upper and lower extremity edema.  This may be due to her symptomatic bradycardia versus new onset heart failure.  Symptomatic bradycardia: - Hold both metoprolol and amiodarone - External pacer in place- for emergencies only - Increase levothyroxine to 75 mcg daily - Echo - Follow-up BNP - Continuous cardiac monitoring - Daily weights - Follow-up cardiology recommendations  Hypothyroidism: TSH elevated to 9.219.  Home levo dose is 50 mcg daily. - Will increase levothyroxine to 75 mcg.  Acute kidney injury: Creatinine elevated to 2.8.  Likely due to cardiorenal syndrome with decreased renal perfusion. - Continue to monitor  Hypertension: Blood pressure within normal limits.  Holding home metoprolol.  Atrial fibrillation: On metoprolol, amiodarone, and Eliquis. - Hold metoprolol and amiodarone as above - Continue home Eliquis  Normocytic anemia: Hemoglobin 8.8.  Maybe due to hypothyroidism.  Consider B12 and folate in the morning.  FEN/GI: Heart healthy DVT prophylaxis: Eliquis CODE STATUS: Full  Dispo: Admit patient to Observation with expected length of stay less than 2 midnights.  Signed: Carroll Sage, MD 09/06/2018, 4:28 PM  Pager: [phone]

## 2018-09-06 NOTE — ED Provider Notes (Signed)
[facility] EMERGENCY DEPARTMENT Provider Note   CSN: [id number] Arrival date & time: 09/06/18  1406    History   Chief Complaint Chief Complaint  Patient presents with  . Weakness  . Dizziness    HPI Maureen Stout is a 85 y.o. female.     The history is provided by the patient and medical records. No language interpreter was used.  Weakness  Associated symptoms: dizziness   Dizziness  Associated symptoms: weakness    Maureen Stout is a 85 y.o. female who presents to the Emergency Department complaining of dizziness, weakness. He presents to the emergency department by EMS from home complaining of two days of weakness and dizziness. She has generalized weakness with persistent dizziness. She denies any fevers, chest pain, shortness of breath, nausea, vomiting, abdominal pain, dysuria. She does have progressive lower extremity as well as upper extremity edema. She did tear nephrologist four days ago. She denies any recent medication changes and she has been compliant with her medications. She took her medications today. She does report poor oral intake. She has chronic urinary incontinence.  She lives alone. Past Medical History:  Diagnosis Date  . Arrhythmia   . Bladder problem   . Diabetes mellitus without complication ([place])   . Dyslipidemia   . Hypertension   . Paroxysmal A-fib [facility])     Patient Active Problem List   Diagnosis Date Noted  . Abnormal CXR 04/13/2018  . On amiodarone therapy 01/08/2018  . Chronic anticoagulation 01/08/2018  . Hypertensive heart disease 01/08/2018  . Elevated TSH 01/08/2018  . Pressure injury of skin 04/19/2017  . Paroxysmal atrial fibrillation ([place]) 04/18/2017    Past Surgical History:  Procedure Laterality Date  . PARTIAL HYSTERECTOMY    . TOE SURGERY    . TONSILLECTOMY       OB History   No obstetric history on file.      Home Medications    Prior to Admission medications   Medication Sig Start Date  End Date Taking? Authorizing Provider  allopurinol (ZYLOPRIM) 300 MG tablet Take 300 mg by mouth at bedtime.  11/05/14   [provider]  amiodarone (PACERONE) 200 MG tablet TAKE 1 TABLET EVERY DAY 08/08/18   Richardo Priest, MD  atorvastatin (LIPITOR) 10 MG tablet Take 10 mg by mouth daily.    [provider]  Calcium Carbonate-Vitamin D (CALCIUM-D PO) Take 1 tablet by mouth 2 (two) times daily.    [provider]  ELIQUIS 2.5 MG TABS tablet TAKE 1 TABLET TWICE DAILY 07/06/18   Richardo Priest, MD  levothyroxine (SYNTHROID, LEVOTHROID) 50 MCG tablet Take 50 mcg by mouth daily. 03/29/18   [provider]  metoprolol tartrate (LOPRESSOR) 25 MG tablet Take 1 tablet (25 mg total) by mouth 2 (two) times daily. 01/10/18 04/12/20  Richardo Priest, MD  Multiple Vitamin (MULTI-VITAMINS) TABS Take 1 tablet by mouth daily.     [provider]  niacin 500 MG CR capsule Take 500 mg by mouth daily.    [provider]  Omega-3 Fatty Acids (FISH OIL) 1200 MG CAPS Take 1,200 mg by mouth 2 (two) times daily.    [provider]  silver sulfADIAZINE (SILVADENE) 1 % cream Apply 1 application topically daily. To foot ulcers 11/11/17   Landis Martins, DPM  Wound Dressings ([place] WOUND/BURN DRESSING) GEL Apply a small amount to wounds daily 04/27/18   Landis Martins, DPM    Family History Family History  Problem Relation Age of Onset  . Cancer Father   . Breast cancer Sister   . Diabetes Neg Hx   . Heart attack Neg Hx     Social History Social History   Tobacco Use  . Smoking status: Never Smoker  . Smokeless tobacco: Never Used  Substance Use Topics  . Alcohol use: No  . Drug use: No     Allergies   Vancomycin   Review of Systems Review of Systems  Neurological: Positive for dizziness and weakness.  All other systems reviewed and are negative.    Physical Exam Updated Vital Signs BP (!) 123/56   Pulse (!) 36   Resp 17   Ht  5\' 6"  (1.676 m)   Wt 81.6 kg   SpO2 95%   BMI 29.05 kg/m   Physical Exam Vitals signs and nursing note reviewed.  Constitutional:      Appearance: She is well-developed.  HENT:     Head: Normocephalic and atraumatic.  Cardiovascular:     Rate and Rhythm: Regular rhythm.     Comments: bradycardic Pulmonary:     Effort: Pulmonary effort is normal. No respiratory distress.  Abdominal:     Palpations: Abdomen is soft.     Tenderness: There is no abdominal tenderness. There is no guarding or rebound.  Musculoskeletal:        General: No tenderness.     Comments: 2+ pitting edema to BLE, RUE.    Skin:    General: Skin is warm and dry.  Neurological:     Mental Status: She is alert and oriented to person, place, and time.     Comments: No assymmetry of facial muscles.  5/5 strength in all four extremities.    Psychiatric:        Behavior: Behavior normal.      ED Treatments / Results  Labs (all labs ordered are listed, but only abnormal results are displayed) Labs Reviewed  COMPREHENSIVE METABOLIC PANEL - Abnormal; Notable for the following components:      Result Value   CO2 21 (*)    BUN 44 (*)    Creatinine, Ser 2.86 (*)    Total Protein 6.2 (*)    Albumin 3.1 (*)    GFR calc non Af Amer 15 (*)    GFR calc Af Amer 17 (*)    All other components within normal limits  CBC WITH DIFFERENTIAL/PLATELET - Abnormal; Notable for the following components:   RBC 2.95 (*)    Hemoglobin 8.8 (*)    HCT 28.4 (*)    RDW 17.3 (*)    nRBC 0.4 (*)    All other components within normal limits  TSH - Abnormal; Notable for the following components:   TSH 9.219 (*)    All other components within normal limits  I-STAT CREATININE, ED - Abnormal; Notable for the following components:   Creatinine, Ser 2.80 (*)    All other components within normal limits  POCT I-STAT EG7 - Abnormal; Notable for the following components:   pO2, Ven 88.0 (*)    HCT 27.0 (*)    Hemoglobin 9.2 (*)     All other components within normal limits  URINE CULTURE  TROPONIN I  URINALYSIS, ROUTINE W REFLEX MICROSCOPIC  CBG MONITORING, ED  CBG MONITORING, ED    EKG EKG Interpretation  Date/Time:  Tuesday September 06 2018 14:19:08 EDT Ventricular Rate:  110 PR Interval:    QRS Duration: 163 QT Interval:  382  QTC Calculation: 473 R Axis:   -50 Text Interpretation:  sinus bradycardia, rate of 30 Confirmed by Quintella Reichert [phone]) on 09/06/2018 2:37:38 PM   Radiology Dg Chest Port 1 View  Result Date: 09/06/2018 CLINICAL DATA:  Dizziness, bradycardia, and weakness. EXAM: PORTABLE CHEST 1 VIEW COMPARISON:  Chest x-ray dated April 18, 2017. FINDINGS: Stable cardiomegaly. Normal mediastinal contours. Normal pulmonary vascularity. Mild left greater than right basilar atelectasis. Possible trace left pleural effusion. No consolidation or pneumothorax. No acute osseous abnormality. IMPRESSION: 1. Possible trace left pleural effusion. Mild bibasilar atelectasis. 2. Stable cardiomegaly. Electronically Signed   By: Titus Dubin M.D.   On: 09/06/2018 14:44    Procedures Procedures (including critical care time)  Medications Ordered in ED Medications  0.9 %  sodium chloride infusion (has no administration in time range)  sodium chloride flush (NS) 0.9 % injection 3 mL (3 mLs Intravenous Given 09/06/18 1432)     Initial Impression / Assessment and Plan / ED Course  I have reviewed the triage vital signs and the nursing notes.  Pertinent labs & imaging results that were available during my care of the patient were reviewed by me and considered in my medical decision making (see chart for details).        Patient with history of atrial fibrillation and CKD here from home for evaluation of progressive weakness and dizziness. She is bradycardia on evaluation and has significant edema. She has a non-focal neurologic examination and is in no acute distress. She was treated with gentle IV fluid  hydration. Labs are significant for acute on chronic kidney injury. EKG demonstrates sinus bradycardia. Plan to admit for further evaluation and management of her symptomatic bradycardia. Patient updated of findings of studies recommendation for admission and she is in agreement with treatment plan. Medicine consulted for admission.  Final Clinical Impressions(s) / ED Diagnoses   Final diagnoses:  None    ED Discharge Orders    None       Quintella Reichert, MD 09/06/18 1554

## 2018-09-07 ENCOUNTER — Observation Stay (HOSPITAL_COMMUNITY): Payer: Medicare PPO

## 2018-09-07 ENCOUNTER — Encounter (HOSPITAL_COMMUNITY): Payer: Self-pay | Admitting: Physician Assistant

## 2018-09-07 ENCOUNTER — Ambulatory Visit: Payer: Medicare PPO | Admitting: Sports Medicine

## 2018-09-07 DIAGNOSIS — I2721 Secondary pulmonary arterial hypertension: Secondary | ICD-10-CM | POA: Diagnosis present

## 2018-09-07 DIAGNOSIS — K859 Acute pancreatitis without necrosis or infection, unspecified: Secondary | ICD-10-CM | POA: Diagnosis not present

## 2018-09-07 DIAGNOSIS — N179 Acute kidney failure, unspecified: Secondary | ICD-10-CM

## 2018-09-07 DIAGNOSIS — S301XXA Contusion of abdominal wall, initial encounter: Secondary | ICD-10-CM | POA: Diagnosis present

## 2018-09-07 DIAGNOSIS — K5790 Diverticulosis of intestine, part unspecified, without perforation or abscess without bleeding: Secondary | ICD-10-CM | POA: Diagnosis not present

## 2018-09-07 DIAGNOSIS — R001 Bradycardia, unspecified: Secondary | ICD-10-CM | POA: Diagnosis present

## 2018-09-07 DIAGNOSIS — I2510 Atherosclerotic heart disease of native coronary artery without angina pectoris: Secondary | ICD-10-CM | POA: Diagnosis present

## 2018-09-07 DIAGNOSIS — E43 Unspecified severe protein-calorie malnutrition: Secondary | ICD-10-CM | POA: Diagnosis present

## 2018-09-07 DIAGNOSIS — E876 Hypokalemia: Secondary | ICD-10-CM | POA: Diagnosis not present

## 2018-09-07 DIAGNOSIS — R339 Retention of urine, unspecified: Secondary | ICD-10-CM | POA: Diagnosis not present

## 2018-09-07 DIAGNOSIS — E8809 Other disorders of plasma-protein metabolism, not elsewhere classified: Secondary | ICD-10-CM | POA: Diagnosis present

## 2018-09-07 DIAGNOSIS — D638 Anemia in other chronic diseases classified elsewhere: Secondary | ICD-10-CM | POA: Diagnosis not present

## 2018-09-07 DIAGNOSIS — R1011 Right upper quadrant pain: Secondary | ICD-10-CM | POA: Diagnosis not present

## 2018-09-07 DIAGNOSIS — Z90711 Acquired absence of uterus with remaining cervical stump: Secondary | ICD-10-CM | POA: Diagnosis not present

## 2018-09-07 DIAGNOSIS — E1122 Type 2 diabetes mellitus with diabetic chronic kidney disease: Secondary | ICD-10-CM | POA: Diagnosis present

## 2018-09-07 DIAGNOSIS — I951 Orthostatic hypotension: Secondary | ICD-10-CM | POA: Diagnosis not present

## 2018-09-07 DIAGNOSIS — Z79890 Hormone replacement therapy (postmenopausal): Secondary | ICD-10-CM

## 2018-09-07 DIAGNOSIS — E11649 Type 2 diabetes mellitus with hypoglycemia without coma: Secondary | ICD-10-CM | POA: Diagnosis present

## 2018-09-07 DIAGNOSIS — R740 Nonspecific elevation of levels of transaminase and lactic acid dehydrogenase [LDH]: Secondary | ICD-10-CM | POA: Diagnosis present

## 2018-09-07 DIAGNOSIS — Z7901 Long term (current) use of anticoagulants: Secondary | ICD-10-CM

## 2018-09-07 DIAGNOSIS — N321 Vesicointestinal fistula: Secondary | ICD-10-CM | POA: Diagnosis not present

## 2018-09-07 DIAGNOSIS — F05 Delirium due to known physiological condition: Secondary | ICD-10-CM | POA: Diagnosis present

## 2018-09-07 DIAGNOSIS — A499 Bacterial infection, unspecified: Secondary | ICD-10-CM | POA: Diagnosis not present

## 2018-09-07 DIAGNOSIS — R627 Adult failure to thrive: Secondary | ICD-10-CM | POA: Diagnosis present

## 2018-09-07 DIAGNOSIS — Z9189 Other specified personal risk factors, not elsewhere classified: Secondary | ICD-10-CM | POA: Diagnosis not present

## 2018-09-07 DIAGNOSIS — E785 Hyperlipidemia, unspecified: Secondary | ICD-10-CM | POA: Diagnosis present

## 2018-09-07 DIAGNOSIS — N390 Urinary tract infection, site not specified: Secondary | ICD-10-CM | POA: Diagnosis present

## 2018-09-07 DIAGNOSIS — D649 Anemia, unspecified: Secondary | ICD-10-CM

## 2018-09-07 DIAGNOSIS — N184 Chronic kidney disease, stage 4 (severe): Secondary | ICD-10-CM | POA: Diagnosis present

## 2018-09-07 DIAGNOSIS — K828 Other specified diseases of gallbladder: Secondary | ICD-10-CM | POA: Diagnosis not present

## 2018-09-07 DIAGNOSIS — G9341 Metabolic encephalopathy: Secondary | ICD-10-CM | POA: Diagnosis present

## 2018-09-07 DIAGNOSIS — I130 Hypertensive heart and chronic kidney disease with heart failure and stage 1 through stage 4 chronic kidney disease, or unspecified chronic kidney disease: Secondary | ICD-10-CM | POA: Diagnosis present

## 2018-09-07 DIAGNOSIS — Z803 Family history of malignant neoplasm of breast: Secondary | ICD-10-CM | POA: Diagnosis not present

## 2018-09-07 DIAGNOSIS — R5381 Other malaise: Secondary | ICD-10-CM | POA: Diagnosis present

## 2018-09-07 DIAGNOSIS — R1013 Epigastric pain: Secondary | ICD-10-CM | POA: Diagnosis not present

## 2018-09-07 DIAGNOSIS — I340 Nonrheumatic mitral (valve) insufficiency: Secondary | ICD-10-CM | POA: Diagnosis not present

## 2018-09-07 DIAGNOSIS — I5043 Acute on chronic combined systolic (congestive) and diastolic (congestive) heart failure: Secondary | ICD-10-CM | POA: Diagnosis not present

## 2018-09-07 DIAGNOSIS — Z4659 Encounter for fitting and adjustment of other gastrointestinal appliance and device: Secondary | ICD-10-CM | POA: Diagnosis not present

## 2018-09-07 DIAGNOSIS — E039 Hypothyroidism, unspecified: Secondary | ICD-10-CM

## 2018-09-07 DIAGNOSIS — I361 Nonrheumatic tricuspid (valve) insufficiency: Secondary | ICD-10-CM | POA: Diagnosis not present

## 2018-09-07 DIAGNOSIS — R7881 Bacteremia: Secondary | ICD-10-CM | POA: Diagnosis not present

## 2018-09-07 DIAGNOSIS — I4891 Unspecified atrial fibrillation: Secondary | ICD-10-CM | POA: Diagnosis not present

## 2018-09-07 DIAGNOSIS — I495 Sick sinus syndrome: Secondary | ICD-10-CM | POA: Diagnosis present

## 2018-09-07 DIAGNOSIS — Y92239 Unspecified place in hospital as the place of occurrence of the external cause: Secondary | ICD-10-CM | POA: Diagnosis present

## 2018-09-07 DIAGNOSIS — I517 Cardiomegaly: Secondary | ICD-10-CM | POA: Diagnosis not present

## 2018-09-07 DIAGNOSIS — A4189 Other specified sepsis: Secondary | ICD-10-CM | POA: Diagnosis not present

## 2018-09-07 DIAGNOSIS — I5041 Acute combined systolic (congestive) and diastolic (congestive) heart failure: Secondary | ICD-10-CM | POA: Diagnosis not present

## 2018-09-07 DIAGNOSIS — R42 Dizziness and giddiness: Secondary | ICD-10-CM | POA: Diagnosis present

## 2018-09-07 DIAGNOSIS — I480 Paroxysmal atrial fibrillation: Secondary | ICD-10-CM | POA: Diagnosis present

## 2018-09-07 DIAGNOSIS — D631 Anemia in chronic kidney disease: Secondary | ICD-10-CM | POA: Diagnosis present

## 2018-09-07 DIAGNOSIS — I952 Hypotension due to drugs: Secondary | ICD-10-CM | POA: Diagnosis not present

## 2018-09-07 DIAGNOSIS — Z931 Gastrostomy status: Secondary | ICD-10-CM | POA: Diagnosis not present

## 2018-09-07 DIAGNOSIS — B9689 Other specified bacterial agents as the cause of diseases classified elsewhere: Secondary | ICD-10-CM | POA: Diagnosis not present

## 2018-09-07 DIAGNOSIS — I5030 Unspecified diastolic (congestive) heart failure: Secondary | ICD-10-CM | POA: Diagnosis not present

## 2018-09-07 DIAGNOSIS — I5032 Chronic diastolic (congestive) heart failure: Secondary | ICD-10-CM | POA: Diagnosis present

## 2018-09-07 DIAGNOSIS — D62 Acute posthemorrhagic anemia: Secondary | ICD-10-CM | POA: Diagnosis not present

## 2018-09-07 DIAGNOSIS — R1012 Left upper quadrant pain: Secondary | ICD-10-CM | POA: Diagnosis not present

## 2018-09-07 DIAGNOSIS — A4159 Other Gram-negative sepsis: Secondary | ICD-10-CM | POA: Diagnosis present

## 2018-09-07 DIAGNOSIS — L97519 Non-pressure chronic ulcer of other part of right foot with unspecified severity: Secondary | ICD-10-CM | POA: Diagnosis not present

## 2018-09-07 DIAGNOSIS — M7989 Other specified soft tissue disorders: Secondary | ICD-10-CM | POA: Diagnosis not present

## 2018-09-07 DIAGNOSIS — N183 Chronic kidney disease, stage 3 (moderate): Secondary | ICD-10-CM | POA: Diagnosis present

## 2018-09-07 DIAGNOSIS — I10 Essential (primary) hypertension: Secondary | ICD-10-CM | POA: Diagnosis not present

## 2018-09-07 DIAGNOSIS — Z87440 Personal history of urinary (tract) infections: Secondary | ICD-10-CM | POA: Diagnosis not present

## 2018-09-07 DIAGNOSIS — W19XXXA Unspecified fall, initial encounter: Secondary | ICD-10-CM | POA: Diagnosis present

## 2018-09-07 DIAGNOSIS — G9340 Encephalopathy, unspecified: Secondary | ICD-10-CM | POA: Diagnosis not present

## 2018-09-07 DIAGNOSIS — I482 Chronic atrial fibrillation, unspecified: Secondary | ICD-10-CM | POA: Diagnosis present

## 2018-09-07 DIAGNOSIS — L89312 Pressure ulcer of right buttock, stage 2: Secondary | ICD-10-CM | POA: Diagnosis present

## 2018-09-07 DIAGNOSIS — Z1159 Encounter for screening for other viral diseases: Secondary | ICD-10-CM | POA: Diagnosis not present

## 2018-09-07 DIAGNOSIS — R41841 Cognitive communication deficit: Secondary | ICD-10-CM | POA: Diagnosis present

## 2018-09-07 DIAGNOSIS — I4892 Unspecified atrial flutter: Secondary | ICD-10-CM | POA: Diagnosis present

## 2018-09-07 DIAGNOSIS — R1310 Dysphagia, unspecified: Secondary | ICD-10-CM | POA: Diagnosis not present

## 2018-09-07 DIAGNOSIS — I169 Hypertensive crisis, unspecified: Secondary | ICD-10-CM | POA: Diagnosis not present

## 2018-09-07 DIAGNOSIS — Z66 Do not resuscitate: Secondary | ICD-10-CM | POA: Diagnosis present

## 2018-09-07 DIAGNOSIS — R601 Generalized edema: Secondary | ICD-10-CM | POA: Diagnosis not present

## 2018-09-07 DIAGNOSIS — Z79899 Other long term (current) drug therapy: Secondary | ICD-10-CM

## 2018-09-07 DIAGNOSIS — D696 Thrombocytopenia, unspecified: Secondary | ICD-10-CM | POA: Diagnosis present

## 2018-09-07 DIAGNOSIS — K851 Biliary acute pancreatitis without necrosis or infection: Secondary | ICD-10-CM | POA: Diagnosis present

## 2018-09-07 DIAGNOSIS — D6959 Other secondary thrombocytopenia: Secondary | ICD-10-CM | POA: Diagnosis present

## 2018-09-07 DIAGNOSIS — K224 Dyskinesia of esophagus: Secondary | ICD-10-CM | POA: Diagnosis present

## 2018-09-07 DIAGNOSIS — R680 Hypothermia, not associated with low environmental temperature: Secondary | ICD-10-CM | POA: Diagnosis not present

## 2018-09-07 DIAGNOSIS — B952 Enterococcus as the cause of diseases classified elsewhere: Secondary | ICD-10-CM | POA: Diagnosis present

## 2018-09-07 HISTORY — DX: Acute pancreatitis without necrosis or infection, unspecified: K85.90

## 2018-09-07 HISTORY — DX: Anemia, unspecified: D64.9

## 2018-09-07 HISTORY — DX: Bradycardia, unspecified: R00.1

## 2018-09-07 LAB — BLOOD CULTURE ID PANEL (REFLEXED)

## 2018-09-07 LAB — GLUCOSE, CAPILLARY
Glucose-Capillary: 59 mg/dL — ABNORMAL LOW (ref 70–99)
Glucose-Capillary: 63 mg/dL — ABNORMAL LOW (ref 70–99)
Glucose-Capillary: 109 mg/dL — ABNORMAL HIGH (ref 70–99)
Glucose-Capillary: 57 mg/dL — ABNORMAL LOW (ref 70–99)
Glucose-Capillary: 96 mg/dL (ref 70–99)
Glucose-Capillary: 116 mg/dL — ABNORMAL HIGH (ref 70–99)
Glucose-Capillary: 89 mg/dL (ref 70–99)
Glucose-Capillary: 90 mg/dL (ref 70–99)

## 2018-09-07 LAB — LIPASE, BLOOD: Lipase: 311 U/L — ABNORMAL HIGH (ref 11–51)

## 2018-09-07 LAB — HEPATIC FUNCTION PANEL
ALT: 37 U/L (ref 0–44)
AST: 30 U/L (ref 15–41)
Albumin: 2.6 g/dL — ABNORMAL LOW (ref 3.5–5.0)
Alkaline Phosphatase: 80 U/L (ref 38–126)
Bilirubin, Direct: <0.1 mg/dL (ref 0.0–0.2)
Total Bilirubin: 0.4 mg/dL (ref 0.3–1.2)
Total Protein: 5.4 g/dL — ABNORMAL LOW (ref 6.5–8.1)

## 2018-09-07 LAB — CBC
HCT: 28.6 % — ABNORMAL LOW (ref 36.0–46.0)
Hemoglobin: 9 g/dL — ABNORMAL LOW (ref 12.0–15.0)
MCH: 29.9 pg (ref 26.0–34.0)
MCHC: 31.5 g/dL (ref 30.0–36.0)
MCV: 95 fL (ref 80.0–100.0)
Platelets: 168 10*3/uL (ref 150–400)
RBC: 3.01 MIL/uL — ABNORMAL LOW (ref 3.87–5.11)
RDW: 17.4 % — ABNORMAL HIGH (ref 11.5–15.5)
WBC: 6 10*3/uL (ref 4.0–10.5)
nRBC: 0 % (ref 0.0–0.2)

## 2018-09-07 LAB — BASIC METABOLIC PANEL
Anion gap: 12 (ref 5–15)
BUN: 46 mg/dL — ABNORMAL HIGH (ref 8–23)
CO2: 20 mmol/L — ABNORMAL LOW (ref 22–32)
Calcium: 9.3 mg/dL (ref 8.9–10.3)
Chloride: 108 mmol/L (ref 98–111)
Creatinine, Ser: 3.05 mg/dL — ABNORMAL HIGH (ref 0.44–1.00)
GFR calc Af Amer: 16 mL/min — ABNORMAL LOW (ref >60)
GFR calc non Af Amer: 13 mL/min — ABNORMAL LOW (ref >60)
Glucose, Bld: 72 mg/dL (ref 70–99)
Potassium: 5 mmol/L (ref 3.5–5.1)
Sodium: 140 mmol/L (ref 135–145)

## 2018-09-07 LAB — T4, FREE: Free T4: 1.14 ng/dL (ref 0.82–1.77)

## 2018-09-07 LAB — TROPONIN I
Troponin I: <0.03 ng/mL (ref <0.03)
Troponin I: <0.03 ng/mL (ref <0.03)

## 2018-09-07 LAB — APTT: aPTT: >200 seconds (ref 24–36)

## 2018-09-07 LAB — HEPARIN LEVEL (UNFRACTIONATED): Heparin Unfractionated: >2.2 IU/mL — ABNORMAL HIGH (ref 0.30–0.70)

## 2018-09-07 LAB — CORTISOL: Cortisol, Plasma: 13.9 ug/dL

## 2018-09-07 MED ORDER — SODIUM CHLORIDE 0.9% FLUSH
10.0000 mL | INTRAVENOUS | Status: DC | PRN
  Administered 2018-09-16: 10 mL
  Filled 2018-09-07: qty 40

## 2018-09-07 MED ORDER — LACTATED RINGERS IV SOLN
INTRAVENOUS | Status: DC
  Administered 2018-09-07 – 2018-09-08 (×2): via INTRAVENOUS

## 2018-09-07 MED ORDER — APIXABAN 2.5 MG PO TABS: 2.5000 mg | ORAL_TABLET | Freq: Two times a day (BID) | ORAL | Status: DC

## 2018-09-07 MED ORDER — HEPARIN (PORCINE) 25000 UT/250ML-% IV SOLN
950.0000 [IU]/h | INTRAVENOUS | Status: DC
  Administered 2018-09-07: 950 [IU]/h via INTRAVENOUS
  Filled 2018-09-07: qty 250

## 2018-09-07 MED ORDER — SODIUM CHLORIDE 0.9 % IV SOLN
2.0000 g | INTRAVENOUS | Status: DC
  Administered 2018-09-07 – 2018-09-15 (×9): 2 g via INTRAVENOUS
  Filled 2018-09-07 (×10): qty 2

## 2018-09-07 MED ORDER — LACTATED RINGERS IV BOLUS
1000.0000 mL | Freq: Once | INTRAVENOUS | Status: AC
  Administered 2018-09-07: 1000 mL via INTRAVENOUS

## 2018-09-07 MED ORDER — HEPARIN (PORCINE) 25000 UT/250ML-% IV SOLN: 700.0000 [IU]/h | INTRAVENOUS | Status: DC

## 2018-09-07 MED ORDER — ENSURE ENLIVE PO LIQD
237.0000 mL | Freq: Two times a day (BID) | ORAL | Status: DC
  Administered 2018-09-07 – 2018-09-17 (×15): 237 mL via ORAL

## 2018-09-07 MED ORDER — DEXTROSE 50 % IV SOLN
INTRAVENOUS | Status: AC
  Administered 2018-09-07: 25 mL
  Filled 2018-09-07: qty 50

## 2018-09-07 NOTE — Progress Notes (Addendum)
Progress Note  Patient Name: RINI MOFFIT Date of Encounter: 09/07/2018  Primary Cardiologist:  Shirlee More, MD  Subjective   Still feels weak, major complaint is abd pain. Says had BM yesterday.   Cannot tell me where she lives or if she lives alone.  Inpatient Medications    Scheduled Meds: . apixaban  2.5 mg Oral BID  . atorvastatin  10 mg Oral Daily  . calcium-vitamin D   Oral BID  . feeding supplement (ENSURE ENLIVE)  237 mL Oral BID BM  . levothyroxine  75 mcg Oral QAC breakfast  . oxybutynin  5 mg Oral Daily   Continuous Infusions:  PRN Meds: acetaminophen **OR** acetaminophen, calcium carbonate, promethazine, senna-docusate   Vital Signs    Vitals:   09/06/18 1938 09/06/18 2024 09/06/18 2319 09/07/18 0456  BP:  (!) 105/54 (!) 118/59 (!) 114/54  Pulse:  (!) 36 (!) 39 (!) 43  Resp:  14 12 13   Temp: (!) 96 F (35.6 C)     TempSrc: Oral   Oral  SpO2:  96% 94% 93%  Weight:    82.2 kg  Height:        Intake/Output Summary (Last 24 hours) at 09/07/2018 0720 Last data filed at 09/07/2018 0526 Gross per 24 hour  Intake 361 ml  Output 350 ml  Net 11 ml   Filed Weights   09/06/18 1412 09/06/18 1801 09/07/18 0456  Weight: 81.6 kg 81.2 kg 82.2 kg    Telemetry    Sinus brady 30s, occ vent beats - Personally Reviewed  ECG    None today - Personally Reviewed  Physical Exam   General: Well developed, elderly, female appearing in mild distress. Head: Normocephalic, atraumatic.  Neck: Supple without bruits, JVD not elevated. Lungs:  Resp regular and unlabored, few rales bases, insp effort poor. Heart: RRR, S1, S2, no S3, S4, or murmur; no rub. Abdomen: Firm, very tender, distended with normoactive bowel sounds. No hepatomegaly.  No obvious abdominal masses. Extremities: No clubbing, cyanosis, no edema. Distal pedal pulses are 2+ bilaterally. Neuro: Alert and oriented X 3. Moves all extremities spontaneously. Psych: Normal affect.  Labs    Hematology  Recent Labs  Lab 09/06/18 1424 09/06/18 1444 09/07/18 0554  WBC 4.8  --  6.0  RBC 2.95*  --  3.01*  HGB 8.8* 9.2* 9.0*  HCT 28.4* 27.0* 28.6*  MCV 96.3  --  95.0  MCH 29.8  --  29.9  MCHC 31.0  --  31.5  RDW 17.3*  --  17.4*  PLT 186  --  168    Chemistry Recent Labs  Lab 09/06/18 1424 09/06/18 1443 09/06/18 1444 09/07/18 0554  NA 141  --  141 140  K 4.8  --  4.7 5.0  CL 109  --   --  108  CO2 21*  --   --  20*  GLUCOSE 80  --   --  72  BUN 44*  --   --  46*  CREATININE 2.86* 2.80*  --  3.05*  CALCIUM 9.5  --   --  9.3  PROT 6.2*  --   --   --   ALBUMIN 3.1*  --   --   --   AST 34  --   --   --   ALT 43  --   --   --   ALKPHOS 91  --   --   --   BILITOT 0.5  --   --   --  GFRNONAA 15*  --   --  13*  GFRAA 17*  --   --  16*  ANIONGAP 11  --   --  12     Cardiac Enzymes Recent Labs  Lab 09/06/18 1424 09/06/18 1936 09/07/18 0005 09/07/18 0554  TROPONINI <0.03 <0.03 <0.03 <0.03   No results for input(s): TROPIPOC in the last 168 hours.   BNP Recent Labs  Lab 09/06/18 1936  BNP 192.9*      Lab Results  Component Value Date   TSH 9.219 (H) 09/06/2018   Free T4  Date Value Ref Range Status  09/07/2018 1.14 0.82 - 1.77 ng/dL Final    Comment:    (NOTE) Biotin ingestion may interfere with free T4 tests. If the results are inconsistent with the TSH level, previous test results, or the clinical presentation, then consider biotin interference. If needed, order repeat testing after stopping biotin. Performed at [facility], [place] [address], [place], [place] [address]   06/17/2017 0.70 0.61 - 1.12 ng/dL Final    Comment:    (NOTE) Biotin ingestion may interfere with free T4 tests. If the results are inconsistent with the TSH level, previous test results, or the clinical presentation, then consider biotin interference. If needed, order repeat testing after stopping biotin.      Radiology    Dg Chest Port 1 View  Result Date: 09/06/2018  CLINICAL DATA:  Dizziness, bradycardia, and weakness. EXAM: PORTABLE CHEST 1 VIEW COMPARISON:  Chest x-ray dated April 18, 2017. FINDINGS: Stable cardiomegaly. Normal mediastinal contours. Normal pulmonary vascularity. Mild left greater than right basilar atelectasis. Possible trace left pleural effusion. No consolidation or pneumothorax. No acute osseous abnormality. IMPRESSION: 1. Possible trace left pleural effusion. Mild bibasilar atelectasis. 2. Stable cardiomegaly. Electronically Signed   By: Titus Dubin M.D.   On: 09/06/2018 14:44     Cardiac Studies   ECHO:  ordered  Patient Profile     85 y.o. female w/ hx PAF, parox flutter on amio, metoprolol 25 mg bid and Eliquis, S brady, CKD III, DM, HTN, HLD, mild LVH and nl EF on echo 04/2017, was admitted 04/21 w/ dizziness & weakness, anemia, cards asked to see for bradycardia, HR 30s at times.   HR 50s at baseline. BB dose has been decreased in the past for HR 40s.   Assessment & Plan    1. Sinus bradycardia:  - HR 30s>>amio and metoprolol on hold - no temp wire or PPM indicated at this time - still on Eliquis, with abdominal pain, will d/c for now  2. Acute on chronic anemia - H&H 8.8/28.4 on admission, 9.0/28.6 today - last labs in our system are from 2018, 11.8/36.2  3. Weakness, dizziness - probably multifactorial from anemia and low HR - Per IM  4. Hypothyroid - TSH elevated, free T4 ok - Synthroid increased from 50 mcg>>75 mcg qd - per FM  5. Abdominal pain, abnl exam - paging FM residents to make sure they eval her early, will make NPO  Otherwise, per FM Active Problems:   Sinus bradycardia   Hypothyroidism   First degree AV block   Acute on chronic anemia    Signed, Rosaria Ferries , PA-C 7:20 AM 09/07/2018 Pager: [phone] As above, patient seen and examined.  She denies chest pain or dyspnea.  She complains of abdominal pain (lower abdomen).  She denies nausea, vomiting, diarrhea, melena or  hematochezia.  On exam she has mild diffuse abdominal tenderness but no rebound  or guarding.  I cannot appreciate masses.  Telemetry has been reviewed.  She remains in sinus bradycardia with heart rate in the 30s at times and occasional PVCs.  We will continue to hold metoprolol and amiodarone and follow heart rate.  There is no indication at present for pacemaker.  Primary care is evaluating abdominal pain and CT has been ordered.  They are also assessing acute on chronic renal insufficiency.  Kirk Ruths, MD

## 2018-09-07 NOTE — Progress Notes (Signed)
ANTICOAGULATION CONSULT NOTE - Initial Consult  Pharmacy Consult for heparin  Indication: chest pain/ACS  Allergies  Allergen Reactions  . Vancomycin Rash    Patient Measurements: Height: 5\' 6"  (167.6 cm) Weight: 181 lb 3.5 oz (82.2 kg) IBW/kg (Calculated) : 59.3 Heparin Dosing Weight: 76kg  Vital Signs: Temp Source: Oral (04/22 0839) BP: 114/54 (04/22 0456) Pulse Rate: 42 (04/22 1100)  Labs: Recent Labs    09/06/18 1424 09/06/18 1443 09/06/18 1444 09/06/18 1936 09/07/18 0005 09/07/18 0554  HGB 8.8*  --  9.2*  --   --  9.0*  HCT 28.4*  --  27.0*  --   --  28.6*  PLT 186  --   --   --   --  168  CREATININE 2.86* 2.80*  --   --   --  3.05*  TROPONINI <0.03  --   --  <0.03 <0.03 <0.03    Estimated Creatinine Clearance: 14.8 mL/min (A) (by C-G formula based on SCr of 3.05 mg/dL (H)).   Medical History: Past Medical History:  Diagnosis Date  . Acute on chronic anemia 09/07/2018  . Bladder problem   . CKD (chronic kidney disease), stage III ([place])   . Diabetes mellitus ([place])   . Dyslipidemia   . Hypertension   . Paroxysmal A-fib ([place])   . Paroxysmal atrial flutter ([place])   . Sinus bradycardia     Medications:  Medications Prior to Admission  Medication Sig Dispense Refill Last Dose  . allopurinol (ZYLOPRIM) 100 MG tablet Take 100 mg by mouth at bedtime.    09/05/2018 at Unknown time  . amiodarone (PACERONE) 200 MG tablet TAKE 1 TABLET EVERY DAY (Patient taking differently: Take 200 mg by mouth daily. ) 90 tablet 1 09/06/2018 at Unknown time  . amLODipine (NORVASC) 5 MG tablet Take 5 mg by mouth daily.   09/06/2018 at Unknown time  . ampicillin (PRINCIPEN) 500 MG capsule Take 500 mg by mouth 4 (four) times daily. For 22 days   09/06/2018 at Unknown time  . atorvastatin (LIPITOR) 10 MG tablet Take 10 mg by mouth daily.   09/06/2018 at Unknown time  . Calcium Carbonate-Vitamin D (CALCIUM-D PO) Take 1 tablet by mouth daily.    09/06/2018 at Unknown time  . ELIQUIS 2.5 MG  TABS tablet TAKE 1 TABLET TWICE DAILY (Patient taking differently: Take 2.5 mg by mouth 2 (two) times daily. ) 180 tablet 1 09/06/2018 at 0800  . levothyroxine (SYNTHROID, LEVOTHROID) 50 MCG tablet Take 50 mcg by mouth daily before breakfast.    09/06/2018 at Unknown time  . metoprolol tartrate (LOPRESSOR) 25 MG tablet Take 1 tablet (25 mg total) by mouth 2 (two) times daily. 180 tablet 3 09/06/2018 at 0800  . Multiple Vitamin (MULTI-VITAMINS) TABS Take 1 tablet by mouth daily.    09/06/2018 at Unknown time  . niacin 500 MG CR capsule Take 500 mg by mouth daily.   09/06/2018 at Unknown time  . Omega-3 Fatty Acids (FISH OIL) 1200 MG CAPS Take 1,200 mg by mouth 2 (two) times daily.   09/06/2018 at Unknown time  . oxybutynin (DITROPAN) 5 MG tablet    09/06/2018 at Unknown time  . silver sulfADIAZINE (SILVADENE) 1 % cream Apply 1 application topically daily. To foot ulcers (Patient taking differently: Apply 1 application topically daily as needed (foot ulcers). ) 50 g 0 09/06/2018 at Unknown time  . Wound Dressings (MEDIHONEY WOUND/BURN DRESSING) GEL Apply a small amount to wounds daily 44 mL 1 09/06/2018  at Unknown time   Scheduled:  . atorvastatin  10 mg Oral Daily  . calcium-vitamin D   Oral BID  . feeding supplement (ENSURE ENLIVE)  237 mL Oral BID BM  . levothyroxine  75 mcg Oral QAC breakfast  . oxybutynin  5 mg Oral Daily    Assessment: 85 yo female here with symptomatic bradycardia and AKI. She is on apixban PTA for afib. Pharmacy consulted to dose heparin.   -Hg= 9, plt= 168  Goal of Therapy:  Heparin level 0.3-0.7 units/ml aPTT 66-102 seconds Monitor platelets by anticoagulation protocol: Yes   Plan:  -No heparin bolus -Begin heparin at 950 units/hr -Heparin level and aPTT in 8 hours and dailt with dailt CBC  Hildred Laser, PharmD Clinical Pharmacist **Pharmacist phone directory can now be found on [email or web address] ([email or web address]).  Listed under [place].

## 2018-09-07 NOTE — Progress Notes (Signed)
   09/07/18 1601  Urine Characteristics  Time patient last voided or urinary catheter emptied 2320  Urinary Interventions Bladder scan  Bladder Scan Volume (mL) 80 mL   Patient has been bladder scanned a few times today, complaining of abd pain, and tight abd upon RN assessment. Tightness about the same as this mornings assessment. I will continue to monitor the patient closely.   Saddie Benders RN

## 2018-09-07 NOTE — Progress Notes (Addendum)
Subjective: Patient reports that her stomach hurts, she does not know when it started. Appears to be diffuse but worse located in the left lower quadrant area and right upper quadrant area. She reported taht she was on elliquis and appeared confused. She was able to tell us her name and the current month and what city she was in. She did keep asking about eliquis and spelling it out.   POCUS showed a non-distended bladder, no evidence of hydronephrosis, distended gall bladder with no evidence of wall thinkening or pericholecystic fluid.  Negative murphy's sign.  Objective:  Vital signs in last 24 hours: Vitals:   09/06/18 1938 09/06/18 2024 09/06/18 2319 09/07/18 0456  BP:  (!) 105/54 (!) 118/59 (!) 114/54  Pulse:  (!) 36 (!) 39 (!) 43  Resp:  14 12 13   Temp: (!) 96 F (35.6 C)     TempSrc: Oral   Oral  SpO2:  96% 94% 93%  Weight:    82.2 kg  Height:        General: Elderly female, NAD, laying in bed Cardiac: Bradycardic into the 40s, normal rhythm Pulmonary: Auscultated anteriorly, lungs clear to auscultation bilaterally Abdomen: Diffuse mild tenderness to palpation in all 4 quadrants, no rebound or guarding,  Extremity: Non-pitting edema of the upper extremities bilaterally, 1+ pitting edema of the lower extremities bilaterally Neuro: Has appeared to be more confused this morning.  She fixated on Eliquis and seeing it in the newspaper.  She was unable to answer any of my other questions.  Assessment/Plan:  Active Problems:   Sinus bradycardia   Hypothyroidism   First degree AV block   Acute on chronic anemia  Maureen Stout is an 85 year old female who is admitted with symptomatic bradycardia likely due to a combination of uncontrolled hypothyroidism and medication use (on both amiodarone and metoprolol for A. fib).  We are holding both of these medications and appreciate cardiology's recommendations.  For further management please see below.  Interestingly she also complains of  mild diffuse abdominal pain since admission.  Abdominal CT showed inflammation and small volume free fluid in the abdomen and pelvis.  Differential includes acute pancreatitis versus acute diverticulitis.  She was also found to have thick-walled urinary bladder containing gas which I suspect is due to her chronic urinary issues including vesicoenteric fistula and is unlikely to be contributing to her abdominal pain.  Will plan on treating with IV fluids and further evaluation with lipase and hepatic function panel.  Symptomatic bradycardia: - Continue holding metoprolol and amiodarone.  Her heart rates do seem to be improving. - Continue external pacer in place for emergencies only - Continue higher dose of levothyroxine to 75 mcg daily - Initially ordered echo.  Cardiology recommends against this given that she had a 2D echo a year ago which was normal. ECHO canceled. - Continuous cardiac monitoring - Daily weights  Hypothyroidism: - Continue levothyroxine 75 mcg  Diffuse abdominal pain: Differential diagnosis includes acute pancreatitis versus diverticulitis. - Will give 1 L LR at 250 mLs per hour - Follow-up lipase and hepatic function panel  Acute on chronic kidney injury: Likely due to decreased renal perfusion in the setting of symptomatic bradycardia.  Creatinine stable today at 3. -Continue to monitor  Atrial fibrillation: -Continue holding metoprolol and amiodarone as above -Continue home Eliquis  Normocytic anemia: Hemoglobin stable today at 9.  Likely due to untreated hypothyroidism vs chronic renal disease. Will continue to monitor.   FEN/GI: Heart healthy DVT prophylaxis:  Eliquis CODE STATUS: Full  Dispo: Anticipated discharge in approximately 2-3 days.  Maureen Sage, MD 09/07/2018, 7:55 AM Pager: [phone]

## 2018-09-07 NOTE — Progress Notes (Signed)
Chart reviewed.  Patient admitted with symptomatic bradycardia with associated dizziness and weakness.  2D echo a year ago was normal.  Due to COVID 19 crisis will hold off on echo for now.

## 2018-09-07 NOTE — Progress Notes (Signed)
[facility] for Heparin  Indication: chest pain/ACS  Allergies  Allergen Reactions  . Vancomycin Rash    Patient Measurements: Height: 5\' 6"  (167.6 cm) Weight: 181 lb 3.5 oz (82.2 kg) IBW/kg (Calculated) : 59.3 Heparin Dosing Weight: 76kg  Vital Signs: BP: 114/54 (04/22 2006) Pulse Rate: 42 (04/22 2006)  Labs: Recent Labs    09/06/18 1424 09/06/18 1443 09/06/18 1444 09/06/18 1936 09/07/18 0005 09/07/18 0554 09/07/18 2131  HGB 8.8*  --  9.2*  --   --  9.0*  --   HCT 28.4*  --  27.0*  --   --  28.6*  --   PLT 186  --   --   --   --  168  --   APTT  --   --   --   --   --   --  >200*  HEPARINUNFRC  --   --   --   --   --   --  >2.20*  CREATININE 2.86* 2.80*  --   --   --  3.05*  --   TROPONINI <0.03  --   --  <0.03 <0.03 <0.03  --     Estimated Creatinine Clearance: 14.8 mL/min (A) (by C-G formula based on SCr of 3.05 mg/dL (H)).   Medical History: Past Medical History:  Diagnosis Date  . Acute on chronic anemia 09/07/2018  . Bladder problem   . CKD (chronic kidney disease), stage III ([place])   . Diabetes mellitus ([place])   . Dyslipidemia   . Hypertension   . Paroxysmal A-fib ([place])   . Paroxysmal atrial flutter ([place])   . Sinus bradycardia    Assessment: 85 yo female here with symptomatic bradycardia and AKI. She is on apixban PTA for afib. Pharmacy consulted to dose heparin.  Currently using aPTT to dose.  -Hg= 9, plt= 168   4/22 PM update: aPTT is elevated at >200, no issues per RN.   Goal of Therapy:  Heparin level 0.3-0.7 units/ml aPTT 66-102 seconds Monitor platelets by anticoagulation protocol: Yes   Plan:  -Hold heparin x 1 hr -Re-start heparin at 700 units/hr after holding x 1 hr -Re-check /aPTTheparin level 8 hours after re-start  Narda Bonds, PharmD, [place] Pharmacist Phone: [phone]

## 2018-09-07 NOTE — TOC Initial Note (Signed)
Transition of Care [facility]) - Initial/Assessment Note    Patient Details  Name: Maureen Stout MRN: [id number] Date of Birth: 01/08/1932  [facility]) CM/SW Contact:    Bethena Roys, RN Phone Number: 09/07/2018, 3:46 PM  Clinical Narrative:   Pt presented for Sinus Bradycardia. PTA from home alone- pt has support of son Maureen Stout. CM was able to speak with Maureen Stout and he checks in on mother daily. Son states patient has been dizzy for about a month and a half. Physician has been tweaking medications. Per Maureen Stout a cousin takes her to MD appointments now, because pt just stopped driving 2 months ago. Pt has DME RW and 3n1 at the home. PT/OT to consult for recommendations. CM will continue to monitor.                 Expected Discharge Plan: Skilled Nursing Facility Barriers to Discharge: Continued Medical Work up    Expected Discharge Plan and Services Expected Discharge Plan: [place]   Discharge Planning Services: CM Consult   Living arrangements for the past 2 months: Single Family Home(has ramp to get into the back of home)                  Prior Living Arrangements/Services Living arrangements for the past 2 months: Single Family Home(has ramp to get into the back of home) Lives with:: Self Patient language and need for interpreter reviewed:: Yes        Need for Family Participation in Patient Care: Yes (Comment) Care giver support system in place?: Yes (comment) Current home services: DME(Pt has RW and 3n1) Criminal Activity/Legal Involvement Pertinent to Current Situation/Hospitalization: No - Comment as needed  Activities of Daily Living Home Assistive Devices/Equipment: Walker (specify type), Grab bars in shower ADL Screening (condition at time of admission) Patient's cognitive ability adequate to safely complete daily activities?: No Is the patient deaf or have difficulty hearing?: No Does the patient have difficulty seeing, even when  wearing glasses/contacts?: No Does the patient have difficulty concentrating, remembering, or making decisions?: Yes Patient able to express need for assistance with ADLs?: Yes Does the patient have difficulty dressing or bathing?: No Independently performs ADLs?: Yes (appropriate for developmental age) Does the patient have difficulty walking or climbing stairs?: Yes Weakness of Legs: Both Weakness of Arms/Hands: None  Permission Sought/Granted Permission sought to share information with : Family Supports     Emotional Assessment Appearance:: Appears stated age   Affect (typically observed): Accepting Orientation: : Oriented to Self Alcohol / Substance Use: Not Applicable Psych Involvement: No (comment)  Admission diagnosis:  hip pain Patient Active Problem List   Diagnosis Date Noted  . Acute on chronic anemia 09/07/2018  . Acute pancreatitis without infection or necrosis 09/07/2018  . Bradycardia 09/07/2018  . Sinus bradycardia 09/06/2018  . Hypothyroidism 09/06/2018  . First degree AV block 09/06/2018  . Abnormal CXR 04/13/2018  . On amiodarone therapy 01/08/2018  . Chronic anticoagulation 01/08/2018  . Hypertensive heart disease 01/08/2018  . Elevated TSH 01/08/2018  . Pressure injury of skin 04/19/2017  . Paroxysmal atrial fibrillation ([place]) 04/18/2017   PCP:  Nicoletta Dress, MD Pharmacy:   [facility] [address], [place] [address] Phone: [phone] Fax: [phone]  [place], [place] [address] Phone: [phone] Fax: [phone]     Social Determinants  of Health (SDOH) Interventions    Readmission Risk Interventions No flowsheet data found.  

## 2018-09-07 NOTE — Progress Notes (Addendum)
PHARMACY - PHYSICIAN COMMUNICATION CRITICAL VALUE ALERT - BLOOD CULTURE IDENTIFICATION (BCID)  Maureen Stout is an 85 y.o. female who presented to [facility] on 09/06/2018 with a chief complaint of dizziness and weakness.  Assessment: urine culture grew GNR and BCID showed E.cloacae complex in 1 of 2 blood cultures thus far.  Hypotensive with WNL WBC.  Name of physician (or Provider) Contacted:  Dr. Donne Hazel  Current antibiotics: none  Changes to prescribed antibiotics recommended:  Recommendations accepted by provider  Cefepime 2gm IV Q24H F/U sensitivity and narrow as appropriate  Results for orders placed or performed during the hospital encounter of 09/06/18  Blood Culture ID Panel (Reflexed) (Collected: 09/07/2018 12:05 AM)  Result Value Ref Range   Enterococcus species NOT DETECTED NOT DETECTED   Listeria monocytogenes NOT DETECTED NOT DETECTED   Staphylococcus species NOT DETECTED NOT DETECTED   Staphylococcus aureus (BCID) NOT DETECTED NOT DETECTED   Streptococcus species NOT DETECTED NOT DETECTED   Streptococcus agalactiae NOT DETECTED NOT DETECTED   Streptococcus pneumoniae NOT DETECTED NOT DETECTED   Streptococcus pyogenes NOT DETECTED NOT DETECTED   Acinetobacter baumannii NOT DETECTED NOT DETECTED   Enterobacteriaceae species DETECTED (A) NOT DETECTED   Enterobacter cloacae complex DETECTED (A) NOT DETECTED   Escherichia coli NOT DETECTED NOT DETECTED   Klebsiella oxytoca NOT DETECTED NOT DETECTED   Klebsiella pneumoniae NOT DETECTED NOT DETECTED   Proteus species NOT DETECTED NOT DETECTED   Serratia marcescens NOT DETECTED NOT DETECTED   Carbapenem resistance NOT DETECTED NOT DETECTED   Haemophilus influenzae NOT DETECTED NOT DETECTED   Neisseria meningitidis NOT DETECTED NOT DETECTED   Pseudomonas aeruginosa NOT DETECTED NOT DETECTED   Candida albicans NOT DETECTED NOT DETECTED   Candida glabrata NOT DETECTED NOT DETECTED   Candida krusei NOT DETECTED NOT DETECTED    Candida parapsilosis NOT DETECTED NOT DETECTED   Candida tropicalis NOT DETECTED NOT DETECTED    Sherrice Creekmore D. Mina Marble, PharmD, BCPS, [place] 09/07/2018, 6:57 PM

## 2018-09-08 ENCOUNTER — Inpatient Hospital Stay (HOSPITAL_COMMUNITY): Payer: Medicare PPO

## 2018-09-08 DIAGNOSIS — R7881 Bacteremia: Secondary | ICD-10-CM

## 2018-09-08 DIAGNOSIS — K859 Acute pancreatitis without necrosis or infection, unspecified: Secondary | ICD-10-CM

## 2018-09-08 DIAGNOSIS — Z66 Do not resuscitate: Secondary | ICD-10-CM

## 2018-09-08 DIAGNOSIS — E162 Hypoglycemia, unspecified: Secondary | ICD-10-CM

## 2018-09-08 DIAGNOSIS — Z960 Presence of urogenital implants: Secondary | ICD-10-CM

## 2018-09-08 DIAGNOSIS — A4159 Other Gram-negative sepsis: Secondary | ICD-10-CM

## 2018-09-08 DIAGNOSIS — B9689 Other specified bacterial agents as the cause of diseases classified elsewhere: Secondary | ICD-10-CM

## 2018-09-08 DIAGNOSIS — N390 Urinary tract infection, site not specified: Secondary | ICD-10-CM

## 2018-09-08 HISTORY — DX: Bacteremia: R78.81

## 2018-09-08 HISTORY — DX: Hypoglycemia, unspecified: E16.2

## 2018-09-08 HISTORY — DX: Other gram-negative sepsis: A41.59

## 2018-09-08 LAB — LIPID PANEL
Cholesterol: 81 mg/dL (ref 0–200)
HDL: 37 mg/dL — ABNORMAL LOW (ref >40)
LDL Cholesterol: 37 mg/dL (ref 0–99)
Total CHOL/HDL Ratio: 2.2 RATIO
Triglycerides: 33 mg/dL (ref <150)
VLDL: 7 mg/dL (ref 0–40)

## 2018-09-08 LAB — BASIC METABOLIC PANEL
Anion gap: 8 (ref 5–15)
BUN: 50 mg/dL — ABNORMAL HIGH (ref 8–23)
CO2: 22 mmol/L (ref 22–32)
Calcium: 8.9 mg/dL (ref 8.9–10.3)
Chloride: 107 mmol/L (ref 98–111)
Creatinine, Ser: 3.24 mg/dL — ABNORMAL HIGH (ref 0.44–1.00)
GFR calc Af Amer: 14 mL/min — ABNORMAL LOW (ref >60)
GFR calc non Af Amer: 12 mL/min — ABNORMAL LOW (ref >60)
Glucose, Bld: 81 mg/dL (ref 70–99)
Potassium: 5 mmol/L (ref 3.5–5.1)
Sodium: 137 mmol/L (ref 135–145)

## 2018-09-08 LAB — CBC WITH DIFFERENTIAL/PLATELET
Abs Immature Granulocytes: 0.04 10*3/uL (ref 0.00–0.07)
Basophils Absolute: 0 10*3/uL (ref 0.0–0.1)
Basophils Relative: 0 %
Eosinophils Absolute: 0 10*3/uL (ref 0.0–0.5)
Eosinophils Relative: 0 %
HCT: 26.1 % — ABNORMAL LOW (ref 36.0–46.0)
Hemoglobin: 8.3 g/dL — ABNORMAL LOW (ref 12.0–15.0)
Immature Granulocytes: 0 %
Lymphocytes Relative: 6 %
Lymphs Abs: 0.6 10*3/uL — ABNORMAL LOW (ref 0.7–4.0)
MCH: 30.3 pg (ref 26.0–34.0)
MCHC: 31.8 g/dL (ref 30.0–36.0)
MCV: 95.3 fL (ref 80.0–100.0)
Monocytes Absolute: 0.2 10*3/uL (ref 0.1–1.0)
Monocytes Relative: 1 %
Neutro Abs: 10.5 10*3/uL — ABNORMAL HIGH (ref 1.7–7.7)
Neutrophils Relative %: 93 %
Platelets: 169 10*3/uL (ref 150–400)
RBC: 2.74 MIL/uL — ABNORMAL LOW (ref 3.87–5.11)
RDW: 17.6 % — ABNORMAL HIGH (ref 11.5–15.5)
WBC: 11.3 10*3/uL — ABNORMAL HIGH (ref 4.0–10.5)
nRBC: 0.2 % (ref 0.0–0.2)

## 2018-09-08 LAB — GLUCOSE, CAPILLARY
Glucose-Capillary: 118 mg/dL — ABNORMAL HIGH (ref 70–99)
Glucose-Capillary: 49 mg/dL — ABNORMAL LOW (ref 70–99)
Glucose-Capillary: 52 mg/dL — ABNORMAL LOW (ref 70–99)
Glucose-Capillary: 61 mg/dL — ABNORMAL LOW (ref 70–99)
Glucose-Capillary: 64 mg/dL — ABNORMAL LOW (ref 70–99)
Glucose-Capillary: 70 mg/dL (ref 70–99)
Glucose-Capillary: 70 mg/dL (ref 70–99)
Glucose-Capillary: 71 mg/dL (ref 70–99)
Glucose-Capillary: 72 mg/dL (ref 70–99)
Glucose-Capillary: 75 mg/dL (ref 70–99)
Glucose-Capillary: 81 mg/dL (ref 70–99)
Glucose-Capillary: 90 mg/dL (ref 70–99)
Glucose-Capillary: 94 mg/dL (ref 70–99)
Glucose-Capillary: 97 mg/dL (ref 70–99)
Glucose-Capillary: 99 mg/dL (ref 70–99)

## 2018-09-08 LAB — URINE CULTURE: Culture: >=100000 — AB

## 2018-09-08 MED ORDER — LACTATED RINGERS IV BOLUS
500.0000 mL | Freq: Once | INTRAVENOUS | Status: AC
  Administered 2018-09-08: 14:00:00 500 mL via INTRAVENOUS

## 2018-09-08 MED ORDER — DEXTROSE 50 % IV SOLN
INTRAVENOUS | Status: AC
  Administered 2018-09-08: 50 mL
  Filled 2018-09-08: qty 50

## 2018-09-08 MED ORDER — DEXTROSE 50 % IV SOLN
25.0000 g | INTRAVENOUS | Status: AC
  Administered 2018-09-08: 25 g via INTRAVENOUS

## 2018-09-08 MED ORDER — LACTATED RINGERS IV BOLUS: 500.0000 mL | Freq: Once | INTRAVENOUS | Status: DC

## 2018-09-08 MED ORDER — LACTATED RINGERS IV SOLN
INTRAVENOUS | Status: DC
  Administered 2018-09-08: 12:00:00 via INTRAVENOUS

## 2018-09-08 MED ORDER — LACTATED RINGERS IV BOLUS
1000.0000 mL | Freq: Once | INTRAVENOUS | Status: AC
  Administered 2018-09-08: 1000 mL via INTRAVENOUS

## 2018-09-08 MED ORDER — DEXTROSE IN LACTATED RINGERS 5 % IV SOLN
INTRAVENOUS | Status: AC
  Administered 2018-09-08 (×2): via INTRAVENOUS

## 2018-09-08 MED ORDER — DEXTROSE 50 % IV SOLN
INTRAVENOUS | Status: AC
  Administered 2018-09-08: 25 mL
  Filled 2018-09-08: qty 50

## 2018-09-08 MED ORDER — HEPARIN SODIUM (PORCINE) 5000 UNIT/ML IJ SOLN
5000.0000 [IU] | Freq: Three times a day (TID) | INTRAMUSCULAR | Status: DC
  Administered 2018-09-08 – 2018-09-15 (×19): 5000 [IU] via SUBCUTANEOUS
  Filled 2018-09-08 (×21): qty 1

## 2018-09-08 NOTE — Progress Notes (Addendum)
   Subjective: She does appear to be less responsive today.  She does not answer any of my questions.  He does however open her eyes to verbal and tactile stimuli and is able to track objects.  Objective:  Vital signs in last 24 hours: Vitals:   09/08/18 0545 09/08/18 0619 09/08/18 0748 09/08/18 0759  BP:    (!) 113/41  Pulse:   (!) 59 61  Resp:   16 14  Temp: (!) 96.6 F (35.9 C) (!) 97.3 F (36.3 C) 99.7 F (37.6 C) 99.4 F (37.4 C)  TempSrc:  Bladder  Oral  SpO2:   93% 92%  Weight:      Height:       General: Lethargic female lying in bed in no acute distress. Neuro: She does not answer any of my questions nor follows commands.  She does open her eyes with verbal and tactile stimuli and tracks objects. Cardiovascular: Bradycardic with heart rates into the 50s, normal sinus rhythm Respiratory: Auscultated lungs anteriorly and are grossly clear, normal oxygen saturations on room air Abdomen: Nontender to palpation in all 4 quadrants, normoactive bowel sounds MSK: 1+ pitting edema of the lower extremities bilaterally, nonpitting edema noted on the upper extremities  Assessment/Plan:  Active Problems:   Sinus bradycardia   Hypothyroidism   First degree AV block   Acute on chronic anemia   Acute pancreatitis without infection or necrosis   Bradycardia   Infection due to Enterobacter cloacae   Bacteremia  Ms. Firkus is an 85 year old female who was admitted with symptomatic bradycardia and was found to have acute pancreatitis and bacteremia (questionable urosepsis).  Bacteremia, acute pancreatitis, ?urosepsis: Blood cultures and urine cultures both grew gram-negative rods, Enterobacter.  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 Additionally her infection could less likely be due to her mild acute pancreatitis. - Continue cefepime - Follow-up blood culture  susceptibilities - Giving IV fluids  Sinus bradycardia in the setting of chronic atrial fibrillation: Rate has improved with holding home medications as below and addressing hypothyroidism.  Appreciate cardiology recommendations - Continue holding amiodarone and metoprolol. - Continue levothyroxine 75 mcg - Per cardiology, holding apixaban in the setting of acute pancreatitis.  Acute kidney injury: Creatinine increased to 3.24 from 3.05 yesterday.   - Continue IV fluids today   Hypothyroidism:  - Continue levothyroxine as above  FEN/GI: N.p.o. DVT prophylaxis: subcu heparin CODE STATUS: DNR; confirmed with patient's son who had a document stating her wishes to remain DNR.   Dispo: Anticipated discharge in approximately 3-7 days.  Carroll Sage, MD 09/08/2018, 10:34 AM Pager: [phone]

## 2018-09-08 NOTE — Progress Notes (Signed)
Midline site assessment: Brisk blood return noted. Dressing changed. Infusion runs freely when arm is abducted. Occludes when she draws arm close to body.  To troubleshoot, check blood return/ flush line. Position arm in outward fashion. If this does not work, will need new site.

## 2018-09-08 NOTE — Progress Notes (Signed)
Hypoglycemic Event  CBG: 52 on 09/08/18 at 1246  Treatment:   Symptoms:   Follow-up CBG: Time: CBG Result:  Possible Reasons for Event:   Comments/MD notified: Dr Alfonse Spruce notified, stated will look at orders.      [place]ech[place] Guard

## 2018-09-08 NOTE — Progress Notes (Signed)
Progress Note  Patient Name: Maureen Stout Date of Encounter: 09/08/2018  Primary Cardiologist: Shirlee More, MD   Subjective   Pt less responsive this AM; no dyspnea; somewhat lethargic  Inpatient Medications    Scheduled Meds: . atorvastatin  10 mg Oral Daily  . calcium-vitamin D   Oral BID  . feeding supplement (ENSURE ENLIVE)  237 mL Oral BID BM  . levothyroxine  75 mcg Oral QAC breakfast  . oxybutynin  5 mg Oral Daily   Continuous Infusions: . ceFEPime (MAXIPIME) IV Stopped (09/07/18 2309)  . heparin 700 Units/hr (09/07/18 2357)  . lactated ringers 125 mL/hr at 09/08/18 0115   PRN Meds: acetaminophen **OR** acetaminophen, calcium carbonate, promethazine, senna-docusate, sodium chloride flush   Vital Signs    Vitals:   09/08/18 0400 09/08/18 0407 09/08/18 0545 09/08/18 0619  BP:      Pulse:      Resp:      Temp: (!) 93.7 F (34.3 C)  (!) 96.6 F (35.9 C) (!) 97.3 F (36.3 C)  TempSrc: Axillary   Bladder  SpO2:      Weight:  85 kg    Height:        Intake/Output Summary (Last 24 hours) at 09/08/2018 0731 Last data filed at 09/08/2018 0414 Gross per 24 hour  Intake 1464.57 ml  Output 100 ml  Net 1364.57 ml   Last 3 Weights 09/08/2018 09/07/2018 09/06/2018  Weight (lbs) 187 lb 6.3 oz 181 lb 3.5 oz 179 lb 1.6 oz  Weight (kg) 85 kg 82.2 kg 81.239 kg      Telemetry     Sinus bradycardia (HR 61 at time of eval)- Personally Reviewed  Physical Exam   GEN: WD, lethargic Neck: supple Cardiac: RRR Respiratory: CTA anteriorly GI: Soft, mildly distended MS: 1+ edema Neuro:  Moves ext; does not respond to all questions   Labs    Chemistry Recent Labs  Lab 09/06/18 1424 09/06/18 1443 09/06/18 1444 09/07/18 0554 09/08/18 0529  NA 141  --  141 140 137  K 4.8  --  4.7 5.0 5.0  CL 109  --   --  108 107  CO2 21*  --   --  20* 22  GLUCOSE 80  --   --  72 81  BUN 44*  --   --  46* 50*  CREATININE 2.86* 2.80*  --  3.05* 3.24*  CALCIUM 9.5  --   --   9.3 8.9  PROT 6.2*  --   --  5.4*  --   ALBUMIN 3.1*  --   --  2.6*  --   AST 34  --   --  30  --   ALT 43  --   --  37  --   ALKPHOS 91  --   --  80  --   BILITOT 0.5  --   --  0.4  --   GFRNONAA 15*  --   --  13* 12*  GFRAA 17*  --   --  16* 14*  ANIONGAP 11  --   --  12 8     Hematology Recent Labs  Lab 09/06/18 1424 09/06/18 1444 09/07/18 0554 09/08/18 0529  WBC 4.8  --  6.0 11.3*  RBC 2.95*  --  3.01* 2.74*  HGB 8.8* 9.2* 9.0* 8.3*  HCT 28.4* 27.0* 28.6* 26.1*  MCV 96.3  --  95.0 95.3  MCH 29.8  --  29.9 30.3  MCHC  31.0  --  31.5 31.8  RDW 17.3*  --  17.4* 17.6*  PLT 186  --  168 169    Cardiac Enzymes Recent Labs  Lab 09/06/18 1424 09/06/18 1936 09/07/18 0005 09/07/18 0554  TROPONINI <0.03 <0.03 <0.03 <0.03    BNP Recent Labs  Lab 09/06/18 1936  BNP 192.9*     Radiology    Ct Abdomen Pelvis Wo Contrast  Result Date: 09/07/2018 CLINICAL DATA:  85 year old female with dizziness, bradycardia, weakness, anterior abdominal pain. EXAM: CT ABDOMEN AND PELVIS WITHOUT CONTRAST TECHNIQUE: Multidetector CT imaging of the abdomen and pelvis was performed following the standard protocol without IV contrast. COMPARISON:  CT Abdomen and Pelvis 03/25/2017. FINDINGS: Lower chest: Layering bilateral pleural effusions, small to moderate. Cardiomegaly which is progressed since 2018. No pericardial effusion. Extensive Calcified aortic atherosclerosis. Mild lung base atelectasis. Hepatobiliary: Small volume perihepatic free fluid with simple fluid density. Increased liver density might indicate amiodarone use. The gallbladder is distended with vicarious excretion of contrast. Although there is right upper quadrant inflammatory stranding, this does not appear intimately related to the gallbladder. Pancreas: The pancreas is indistinct, especially the pancreatic tail, and there is inflammation about the tail and in the lesser sac. No pancreatic ductal dilatation is evident. No  organized or drainable fluid collection. Spleen: Inflammation at the splenic hilum.  Otherwise negative. Adrenals/Urinary Tract: Normal adrenal glands. 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 No hydronephrosis. Small mildly hyperdense left renal upper pole cyst appears chronic. The proximal ureters seem decompressed. There are right side gonadal vein phleboliths. The urinary bladder is decompressed but thick-walled (13 millimeters series 3, image 78), and contains gas. Mild if any perivesical stranding. Stomach/Bowel: Diverticulosis throughout the large bowel. Although there are areas of pericolic fluid and stranding, especially in both gutters (series 3, image 45), there are intervening segments of noninflamed bowel (transverse colon image 52), and the inflammatory stranding might be emanating from the tail of the pancreas rather than the bowel. No pneumoperitoneum. Small volume of free fluid in the mesentery. No dilated small bowel. Negative terminal ileum. The gastric fundus is indistinct also in an area of fat stranding. The remaining stomach is within normal limits. There is confluent stranding also about the duodenum which may be secondarily inflamed. Vascular/Lymphatic: Vascular patency is not evaluated in the absence of IV contrast. Aortoiliac calcified atherosclerosis. Reproductive: Surgically absent uterus. Diminutive or absent ovaries. Other: Small volume simple appearing pelvic free fluid. Musculoskeletal: Scoliosis and degenerative changes throughout the spine. No acute osseous abnormality identified. IMPRESSION: 1. Inflammation and small volume free fluid in the abdomen and pelvis which is favored secondary to Acute Pancreatitis. However, there is diffuse large bowel diverticulosis and alternative diagnosis of acute diverticulitis is difficult to exclude. But, portions of the stomach and duodenum also appear secondarily inflamed which favors pancreatitis. No free air or drainable  fluid collection. 2. Thick-walled urinary bladder containing gas suspicious for superimposed UTI. No obstructive uropathy. 3. Small to moderate layering pleural effusions with lung base atelectasis. 4. Distended gallbladder with vicarious contrast excretion but no strong CT evidence of acute cholecystitis. 5.  Aortic Atherosclerosis (ICD10-I70.0). Electronically Signed   By: Genevie Ann M.D.   On: 09/07/2018 09:33   Dg Chest Port 1 View  Result Date: 09/06/2018 CLINICAL DATA:  Dizziness, bradycardia, and weakness. EXAM: PORTABLE CHEST 1 VIEW COMPARISON:  Chest x-ray dated April 18, 2017. FINDINGS: Stable cardiomegaly. Normal mediastinal contours. Normal pulmonary vascularity. Mild left greater than right basilar atelectasis. Possible  trace left pleural effusion. No consolidation or pneumothorax. No acute osseous abnormality. IMPRESSION: 1. Possible trace left pleural effusion. Mild bibasilar atelectasis. 2. Stable cardiomegaly. Electronically Signed   By: Titus Dubin M.D.   On: 09/06/2018 14:44    Patient Profile     85 y.o. female w/ hx PAF, parox flutter on amio, metoprolol 25 mg bid and Eliquis, S brady, CKD III, DM, HTN, HLD, mild LVH and nl EF on echo 04/2017, was admitted 04/21 w/ dizziness & weakness, anemia, cards asked to see for bradycardia, HR 30s at times.   HR 50s at baseline. BB dose has been decreased in the past for HR 40s.   Assessment & Plan    1 bradycardia-heart rate has improved off of metoprolol and amiodarone.  We will continue to hold both for now.  No indication for pacemaker.  2 urosepsis-urine and blood growing gram-negative rods (enterobacter).  Antibiotics initiated.  3 abdominal pain-question pancreatitis-management per primary care.  4 history of paroxysmal atrial fibrillation-patient has been in sinus rhythm since admission.  Metoprolol and amiodarone on hold because of bradycardia.  Given question pancreatitis I would not anticoagulate at this point with  therapeutic doses of heparin.  Will change to subcutaneous heparin.  Resume apixaban at discharge.  5 volume excess-she appears to be volume overloaded today.  Would change IVFs to [facility].  6 acute on chronic stage III kidney disease-continue to follow renal function.  For questions or updates, please contact [place] Please consult [email or web address] for contact info under        Signed, Kirk Ruths, MD  09/08/2018, 7:31 AM

## 2018-09-08 NOTE — Progress Notes (Signed)
CRITICAL VALUE ALERT  Critical Value:  97  Date & Time Notied:  09/08/2018   Provider Notified: Dr. March Rummage and Dr. Heber [place] at bedside  Orders Received/Actions taken: 1/2 amp dextrose given

## 2018-09-08 NOTE — Progress Notes (Signed)
Went to evaluate [name] twice during the afternoon. Both times she was seen resting well in her bed.   She is answering to yes and no questions. She was picking at her midline site and requested to get her pulse oximetry off.   She has been placed on 2L [place] as her oxygen saturation dropped to the 80s on room air. She has likely developed pulmonary edema, but she continues to require IVF due to urosepsis and pancreatitis. She put out 361ml of urine.   Physical Exam  Cardiovascular: Normal rate, regular rhythm and normal heart sounds.  Respiratory: Effort normal and breath sounds normal. No respiratory distress. She has no wheezes.  2L [place]  GI: Soft. Bowel sounds are normal. She exhibits no distension. There is no abdominal tenderness.  Neurological: She is alert.  Able to say her first name   Assessment and plan  Please continue aggressive hydration for urosepsis and acute pancreatitis.   -continue dextrose 5% LR infusion at 129ml -continue frequent monitoring of patient's oxygen saturation and volume status  -monitor fevers  -please stop fluids if patient is requiring more than 4L via [place] -please place mittens if patient continues to pick at midline site.   Lars Mage, MD Internal Medicine PGY2 JGOTL:[phone] 09/08/2018, 7:50 PM

## 2018-09-08 NOTE — Progress Notes (Signed)
RN spoke with Dr. Annie Paras pertaining to patient's change in mental status from the previous night.  RN also updated Dr. Annie Paras with patient's recent bladder scan result.  Thermometer still unable to obtain a temperature on patient.  Order received to place a temperature foley.

## 2018-09-08 NOTE — Progress Notes (Signed)
Pt has lost IV access at present time. IV team previously assessed midline, worked for few minutes then stopped. IV team called to reassess midline access. Cont to monitor. Carroll Kinds RN

## 2018-09-08 NOTE — Progress Notes (Signed)
RN spoke with Dr. Annie Paras pertaining to patient's hypoglycemic episode overnight; resolved with IVP Dextrose.  RN also updated Dr. Annie Paras on patient's blood sugars trending back down.  No new orders received at this time.

## 2018-09-08 NOTE — Progress Notes (Signed)
PT Cancellation Note  Patient Details Name: Maureen Stout MRN: [id number] DOB: 1932-04-08   Cancelled Treatment:    Reason Eval/Treat Not Completed: (P) Patient not medically ready RN request hold off evaluation today due to sepsis. PT will follow back tomorrow.  Roman Dubuc B. Migdalia Dk PT, DPT Acute Rehabilitation Services Pager [phone] Office [phone]   Ryan 09/08/2018, 12:02 PM

## 2018-09-08 NOTE — Progress Notes (Signed)
CRITICAL VALUE ALERT  Critical Value:  17  Date & Time Notied:  09/08/2018  Provider Notified: Dr. Heber  Dr. Alfonse Spruce at bedside  Orders Received/Actions taken: 1 amp D50 given

## 2018-09-08 NOTE — Progress Notes (Signed)
OT Cancellation Note  Patient Details Name: Maureen Stout MRN: [id number] DOB: 28-Jan-1932   Cancelled Treatment:    Reason Eval/Treat Not Completed: Medical issues which prohibited therapy(Pt with sepsis, RN requesting therapy deferral today.)  Malka So 09/08/2018, 12:38 PM  Nestor Lewandowsky, OTR/L Acute Rehabilitation Services Pager: [phone] Office: [phone]

## 2018-09-08 NOTE — Progress Notes (Signed)
Bair hugger applied to patient.

## 2018-09-08 NOTE — Progress Notes (Signed)
Cont to have issues with midline being positional and not running when pt moves. IV team ordered placed to assess once again. IV team has assessed line 3 times today, works but positional. Cont to monitor. Carroll Kinds RN

## 2018-09-09 DIAGNOSIS — A4189 Other specified sepsis: Secondary | ICD-10-CM

## 2018-09-09 LAB — BASIC METABOLIC PANEL
Anion gap: 9 (ref 5–15)
BUN: 48 mg/dL — ABNORMAL HIGH (ref 8–23)
CO2: 21 mmol/L — ABNORMAL LOW (ref 22–32)
Calcium: 9.1 mg/dL (ref 8.9–10.3)
Chloride: 110 mmol/L (ref 98–111)
Creatinine, Ser: 3.33 mg/dL — ABNORMAL HIGH (ref 0.44–1.00)
GFR calc Af Amer: 14 mL/min — ABNORMAL LOW (ref >60)
GFR calc non Af Amer: 12 mL/min — ABNORMAL LOW (ref >60)
Glucose, Bld: 92 mg/dL (ref 70–99)
Potassium: 4.5 mmol/L (ref 3.5–5.1)
Sodium: 140 mmol/L (ref 135–145)

## 2018-09-09 LAB — CBC WITH DIFFERENTIAL/PLATELET
Band Neutrophils: 0 %
Basophils Absolute: 0 10*3/uL (ref 0.0–0.1)
Basophils Relative: 0 %
Blasts: 0 %
Eosinophils Absolute: 0 10*3/uL (ref 0.0–0.5)
Eosinophils Relative: 0 %
HCT: 25.8 % — ABNORMAL LOW (ref 36.0–46.0)
Hemoglobin: 8.2 g/dL — ABNORMAL LOW (ref 12.0–15.0)
Lymphocytes Relative: 9 %
Lymphs Abs: 0.9 10*3/uL (ref 0.7–4.0)
MCH: 30.1 pg (ref 26.0–34.0)
MCHC: 31.8 g/dL (ref 30.0–36.0)
MCV: 94.9 fL (ref 80.0–100.0)
Metamyelocytes Relative: 0 %
Monocytes Absolute: 0.2 10*3/uL (ref 0.1–1.0)
Monocytes Relative: 2 %
Myelocytes: 0 %
Neutro Abs: 8.5 10*3/uL — ABNORMAL HIGH (ref 1.7–7.7)
Neutrophils Relative %: 89 %
Other: 0 %
Platelets: 138 10*3/uL — ABNORMAL LOW (ref 150–400)
Promyelocytes Relative: 0 %
RBC: 2.72 MIL/uL — ABNORMAL LOW (ref 3.87–5.11)
RDW: 17.9 % — ABNORMAL HIGH (ref 11.5–15.5)
WBC: 9.6 10*3/uL (ref 4.0–10.5)
nRBC: 0 % (ref 0.0–0.2)
nRBC: 0 /100 WBC

## 2018-09-09 LAB — GLUCOSE, CAPILLARY
Glucose-Capillary: 117 mg/dL — ABNORMAL HIGH (ref 70–99)
Glucose-Capillary: 132 mg/dL — ABNORMAL HIGH (ref 70–99)
Glucose-Capillary: 83 mg/dL (ref 70–99)
Glucose-Capillary: 92 mg/dL (ref 70–99)
Glucose-Capillary: 92 mg/dL (ref 70–99)

## 2018-09-09 LAB — CULTURE, BLOOD (ROUTINE X 2): Special Requests: ADEQUATE

## 2018-09-09 MED ORDER — AMIODARONE HCL 100 MG PO TABS
100.0000 mg | ORAL_TABLET | Freq: Every day | ORAL | Status: DC
  Administered 2018-09-09 – 2018-09-19 (×11): 100 mg via ORAL
  Filled 2018-09-09 (×11): qty 1

## 2018-09-09 MED ORDER — DEXTROSE IN LACTATED RINGERS 5 % IV SOLN: INTRAVENOUS | Status: DC

## 2018-09-09 MED ORDER — DEXTROSE IN LACTATED RINGERS 5 % IV SOLN
INTRAVENOUS | Status: DC
  Administered 2018-09-09: 01:00:00 via INTRAVENOUS

## 2018-09-09 MED ORDER — RAMELTEON 8 MG PO TABS
8.0000 mg | ORAL_TABLET | Freq: Once | ORAL | Status: AC
  Administered 2018-09-09: 8 mg via ORAL
  Filled 2018-09-09: qty 1

## 2018-09-09 NOTE — Progress Notes (Signed)
Patients right eye lid swollen, patient having trouble opening eye. Patient pulling on PIVs, lines, leads, and picking at skin. Paged MD for a sitter order. Will continue to monitor patient.

## 2018-09-09 NOTE — Evaluation (Addendum)
Physical Therapy Evaluation Patient Details Name: Maureen Stout MRN: [id number] DOB: 1932-02-09 Today's Date: 09/09/2018   History of Present Illness  85 yo admitted with dizziness and weakness with several falls. Pt with bradycardia, aV block, urosepsis. PMhx: DM, Afib, HTN, bradycardia  Clinical Impression  Pt on arrival with hands under bear hugger and when bear hugger pulled back pt pulled off all dressings on arms and picking at lines and skin with multiple wounds. RN to assist with dressing and skin care. Pt confused stating she lives with her mom statin she and her mom are both 30yo. Pt able to follow single step commands with increased time with focused attention needing max cues to not pick/pull at skin and lines throughout. Pt with decreased strength, transfers, mobility, cognition and function who has been living alone and was completely independent until 6 weeks ago. Pt has been using RW since then and has assist for driving. Pt with noted blackened areas on callus of both feet to which son states she does follow with a podiatrist. Pt would benefit from acute therapy to maximize mobility, function and safety to return to PLOF and decrease burden of care.   HR 48-57 SpO2 90-94% on RA    Follow Up Recommendations CIR;Supervision/Assistance - 24 hour    Equipment Recommendations  3in1 (PT)    Recommendations for Other Services Rehab consult     Precautions / Restrictions Precautions Precautions: Fall Precaution Comments: picks at skin and lines, multiple UE lacerations      Mobility  Bed Mobility Overal bed mobility: Needs Assistance Bed Mobility: Supine to Sit     Supine to sit: HOB elevated;Min assist;+2 for safety/equipment     General bed mobility comments: HOB 40 degrees with HHA to pivot to EOB and multimodal cues with increased time. +2 for lines and safety to maintain skin  Transfers Overall transfer level: Needs assistance   Transfers: Sit to/from Stand;Stand  Pivot Transfers Sit to Stand: Mod assist;+2 physical assistance;From elevated surface Stand pivot transfers: Mod assist;+2 physical assistance       General transfer comment: mod assist to stand from bed, pivot with RW bed to chair with short shuffling steps and sitting prematurely. Additional stand from chair with cues for hand placement and sequence. Min assist to scoot back in chair  Ambulation/Gait             General Gait Details: unsafe to attempt today based on function and cognition  Stairs            Wheelchair Mobility    Modified Rankin (Stroke Patients Only)       Balance Overall balance assessment: Needs assistance   Sitting balance-Leahy Scale: Fair     Standing balance support: Bilateral upper extremity supported Standing balance-Leahy Scale: Poor Standing balance comment: bil UE support in standing                             Pertinent Vitals/Pain Pain Assessment: No/denies pain    Home Living Family/patient expects to be discharged to:: Private residence Living Arrangements: Alone Available Help at Discharge: Family;Available PRN/intermittently Type of Home: House Home Access: Ramped entrance     Home Layout: One level Home Equipment: Walker - 2 wheels      Prior Function Level of Independence: Independent with assistive device(s)         Comments: 6wks with walker prior to that was independent, doesn't drive at least 2  months. confused x a week. does have a podiatrist she follows with     Hand Dominance        Extremity/Trunk Assessment   Upper Extremity Assessment Upper Extremity Assessment: Defer to OT evaluation    Lower Extremity Assessment Lower Extremity Assessment: Generalized weakness    Cervical / Trunk Assessment Cervical / Trunk Assessment: Kyphotic  Communication   Communication: HOH  Cognition Arousal/Alertness: Awake/alert Behavior During Therapy: Flat affect Overall Cognitive Status:  Impaired/Different from baseline Area of Impairment: Orientation;Attention;Memory;Problem solving;Following commands;Safety/judgement                 Orientation Level: Disoriented to;Time;Situation;Place Current Attention Level: Focused Memory: Decreased short-term memory Following Commands: Follows one step commands inconsistently;Follows one step commands with increased time Safety/Judgement: Decreased awareness of safety;Decreased awareness of deficits   Problem Solving: Slow processing;Requires verbal cues General Comments: pt picking at skin on arrival with 3 open wounds including large skin tear, RN present and addressing wounds      General Comments      Exercises     Assessment/Plan    PT Assessment Patient needs continued PT services  PT Problem List Decreased strength;Decreased balance;Decreased cognition;Decreased knowledge of precautions;Decreased mobility;Decreased knowledge of use of DME;Decreased activity tolerance;Decreased coordination;Decreased safety awareness;Impaired sensation;Decreased skin integrity;Obesity;Cardiopulmonary status limiting activity       PT Treatment Interventions Gait training;Therapeutic activities;Therapeutic exercise;Cognitive remediation;DME instruction;Functional mobility training;Balance training;Patient/family education    PT Goals (Current goals can be found in the Care Plan section)  Acute Rehab PT Goals Patient Stated Goal: return home PT Goal Formulation: With family Time For Goal Achievement: 09/23/18 Potential to Achieve Goals: Fair    Frequency Min 3X/week   Barriers to discharge Decreased caregiver support pt lives alone but son states family can probably arrange 24hr assist    Co-evaluation               AM-PAC PT "6 Clicks" Mobility  Outcome Measure Help needed turning from your back to your side while in a flat bed without using bedrails?: A Little Help needed moving from lying on your back to  sitting on the side of a flat bed without using bedrails?: A Lot Help needed moving to and from a bed to a chair (including a wheelchair)?: A Lot Help needed standing up from a chair using your arms (e.g., wheelchair or bedside chair)?: A Lot Help needed to walk in hospital room?: Total Help needed climbing 3-5 steps with a railing? : Total 6 Click Score: 11    End of Session Equipment Utilized During Treatment: Gait belt Activity Tolerance: Patient tolerated treatment well Patient left: in chair;with call bell/phone within reach;with chair alarm set;Other (comment)(mitten on right hand as no others on unit) Nurse Communication: Mobility status;Precautions PT Visit Diagnosis: Other abnormalities of gait and mobility (R26.89);Muscle weakness (generalized) (M62.81);Difficulty in walking, not elsewhere classified (R26.2)    Time: 2841-3244 PT Time Calculation (min) (ACUTE ONLY): 35 min   Charges:   PT Evaluation $PT Eval Moderate Complexity: 1 Mod PT Treatments $Therapeutic Activity: 8-22 mins        Brenton Joines Pam Drown, PT Acute Rehabilitation Services Pager: [phone] Office: [phone]   Milla Wahlberg B Ethaniel Garfield 09/09/2018, 10:23 AM

## 2018-09-09 NOTE — Care Management Important Message (Signed)
Important Message  Patient Details  Name: JONAH NESTLE MRN: [id number] Date of Birth: [birth year]   Medicare Important Message Given:  Yes    Swetha Rayle 09/09/2018, 2:25 PM

## 2018-09-09 NOTE — Progress Notes (Signed)
Patient wheezy upon assessment, on lactated ringers at 160ml/hr. Paged IM about respiratory assessment. MD at bedside, okay to continue fluids. Will continue to monitor patient.

## 2018-09-09 NOTE — Evaluation (Signed)
Occupational Therapy Evaluation Patient Details Name: Maureen Stout MRN: [id number] DOB: 1932/05/11 Today's Date: 09/09/2018    History of Present Illness 85 yo admitted with dizziness and weakness with several falls. Pt with bradycardia, aV block, urosepsis. PMhx: DM, Afib, HTN, bradycardia   Clinical Impression   PT admitted with see above. Pt currently with functional limitiations due to the deficits listed below (see OT problem list). Pt at baseline lives alone and was driving 6 weeks ago. Pt demonstrates total +2 mod (A) transfer this session. pt with cognitive deficits noted.  Pt will benefit from skilled OT to increase their independence and safety with adls and balance to allow discharge CIR.     Follow Up Recommendations  CIR    Equipment Recommendations  3 in 1 bedside commode;Wheelchair (measurements OT);Wheelchair cushion (measurements OT)    Recommendations for Other Services PT consult;Speech consult     Precautions / Restrictions Precautions Precautions: Fall Precaution Comments: picks at skin and lines, multiple UE lacerations      Mobility Bed Mobility Overal bed mobility: Needs Assistance Bed Mobility: Supine to Sit     Supine to sit: HOB elevated;Min assist;+2 for safety/equipment     General bed mobility comments: HOB 40 degrees with HHA to pivot to EOB and multimodal cues with increased time. +2 for lines and safety to maintain skin  Transfers Overall transfer level: Needs assistance   Transfers: Sit to/from Stand;Stand Pivot Transfers Sit to Stand: Mod assist;+2 physical assistance;From elevated surface Stand pivot transfers: Mod assist;+2 physical assistance       General transfer comment: mod assist to stand from bed, pivot with RW bed to chair with short shuffling steps and sitting prematurely. Additional stand from chair with cues for hand placement and sequence. Min assist to scoot back in chair . pt initiating with chair positioned so that  patient can visualize    Balance Overall balance assessment: Needs assistance   Sitting balance-Leahy Scale: Fair     Standing balance support: Bilateral upper extremity supported Standing balance-Leahy Scale: Poor Standing balance comment: bil UE support in standing, pt with slight L lean                           ADL either performed or assessed with clinical judgement   ADL Overall ADL's : Needs assistance/impaired Eating/Feeding: Maximal assistance   Grooming: Modified independent   Upper Body Bathing: Moderate assistance   Lower Body Bathing: Total assistance   Upper Body Dressing : Maximal assistance   Lower Body Dressing: Total assistance   Toilet Transfer: +2 for physical assistance;Moderate assistance   Toileting- Clothing Manipulation and Hygiene: Total assistance         General ADL Comments: pt pulling at open wounds without clear awareness to self harm at this time. pt with blood on hands and even with visualization seems unalarmed by the sight of the blood.      Vision         Perception     Praxis      Pertinent Vitals/Pain Pain Assessment: No/denies pain     Hand Dominance Right   Extremity/Trunk Assessment Upper Extremity Assessment Upper Extremity Assessment: Generalized weakness   Lower Extremity Assessment Lower Extremity Assessment: Defer to PT evaluation   Cervical / Trunk Assessment Cervical / Trunk Assessment: Kyphotic   Communication Communication Communication: HOH   Cognition Arousal/Alertness: Awake/alert Behavior During Therapy: Flat affect Overall Cognitive Status: Impaired/Different from baseline Area of  Impairment: Orientation;Attention;Memory;Problem solving;Following commands;Safety/judgement                 Orientation Level: Disoriented to;Time;Situation;Place Current Attention Level: Focused Memory: Decreased short-term memory Following Commands: Follows one step commands  inconsistently;Follows one step commands with increased time Safety/Judgement: Decreased awareness of safety;Decreased awareness of deficits   Problem Solving: Slow processing;Requires verbal cues General Comments: pt picking at skin on arrival with 3 open wounds including large skin tear, RN present and addressing wounds pt reports age as 30 and living with mother. when asked mothers age reports 71.    General Comments  multiple skin wounds present during session/ edema noted at feet and wrist    Exercises     Shoulder Instructions      Home Living Family/patient expects to be discharged to:: Private residence Living Arrangements: Alone Available Help at Discharge: Family;Available PRN/intermittently Type of Home: House Home Access: Ramped entrance     Home Layout: One level     Bathroom Shower/Tub: Occupational psychologist: Handicapped height     Home Equipment: Environmental consultant - 2 wheels          Prior Functioning/Environment Level of Independence: Independent with assistive device(s)        Comments: 6wks with walker prior to that was independent, doesn't drive at least 2 months. confused x a week. does have a podiatrist she follows with        OT Problem List: Decreased strength;Decreased range of motion;Impaired balance (sitting and/or standing);Decreased activity tolerance;Decreased coordination;Decreased cognition;Decreased safety awareness;Decreased knowledge of use of DME or AE;Decreased knowledge of precautions;Obesity;Cardiopulmonary status limiting activity      OT Treatment/Interventions: Self-care/ADL training;Therapeutic exercise;Neuromuscular education;Energy conservation;DME and/or AE instruction;Manual therapy;Modalities;Therapeutic activities;Cognitive remediation/compensation;Patient/family education;Balance training    OT Goals(Current goals can be found in the care plan section) Acute Rehab OT Goals Patient Stated Goal: return home OT Goal  Formulation: Patient unable to participate in goal setting Time For Goal Achievement: 09/23/18 Potential to Achieve Goals: Good  OT Frequency: Min 3X/week   Barriers to D/C: Decreased caregiver support(lives alone but has family)          Co-evaluation PT/OT/SLP Co-Evaluation/Treatment: Yes Reason for Co-Treatment: Complexity of the patient's impairments (multi-system involvement);Necessary to address cognition/behavior during functional activity;For patient/therapist safety;To address functional/ADL transfers   OT goals addressed during session: ADL's and self-care;Proper use of Adaptive equipment and DME;Strengthening/ROM      AM-PAC OT "6 Clicks" Daily Activity     Outcome Measure Help from another person eating meals?: A Lot Help from another person taking care of personal grooming?: A Lot Help from another person toileting, which includes using toliet, bedpan, or urinal?: A Lot Help from another person bathing (including washing, rinsing, drying)?: A Lot Help from another person to put on and taking off regular upper body clothing?: A Lot Help from another person to put on and taking off regular lower body clothing?: Total 6 Click Score: 11   End of Session Equipment Utilized During Treatment: Gait belt;Rolling walker Nurse Communication: Mobility status;Precautions  Activity Tolerance: Patient tolerated treatment well Patient left: in chair;with call bell/phone within reach;with chair alarm set  OT Visit Diagnosis: Unsteadiness on feet (R26.81);Muscle weakness (generalized) (M62.81)                Time: 2505-3976 OT Time Calculation (min): 32 min Charges:  OT General Charges $OT Visit: 1 Visit OT Evaluation $OT Eval Moderate Complexity: 1 Mod   Jeri Modena, OTR/L  Acute Rehabilitation Services  Pager: [phone] Office: [phone] .   Jeri Modena 09/09/2018, 12:18 PM

## 2018-09-09 NOTE — Progress Notes (Signed)
Patient pulled off mitts and pulled on lines, wrist bands, and skin. Patient made skin tear on left hand. Pt  stated/attempted getting out of bed. Cleansed and wrapped skin tear. Placed mitts and bed alarm back on. Paged MD, got orders to decrease environment stimulation and [place]. Will continue to monitor patient.

## 2018-09-09 NOTE — Progress Notes (Addendum)
   Subjective:   Pertinent overnight events: She developed wheezing and was placed on 2 L supplemental oxygen (now back on room air). She was also agitated and pulled on lines and wrist bands. She calmed down throughout the night.  She is much more alert today. She is able to tell me her name and that she wants to sit up in bed. She however cannot tell me where she is and why she is in the hospital. She has no acute complaints this morning.   Objective:  Vital signs in last 24 hours: Vitals:   09/08/18 2053 09/09/18 0046 09/09/18 0417 09/09/18 0642  BP: (!) 128/57 134/89 (!) 128/54 (!) 107/51  Pulse: 61 67 (!) 59 (!) 50  Resp: 15 (!) 35 16 17  Temp: (!) 96.3 F (35.7 C) (!) 95.9 F (35.5 C) (!) 95.7 F (35.4 C) (!) 95.5 F (35.3 C)  TempSrc: Bladder Bladder Bladder   SpO2: 96%  94% 97%  Weight:      Height:       General: Lying in bed in no acute distress Neuro: Alert, oriented x1 (person), she is able to answer some of my questions per above. CV: Heart rate in the 50s-60s, normal rhythm Resp: Crackles at the lung bases bilaterally, normal WOB, normal oxygen saturations on room air.   Assessment/Plan:  Principal Problem:   Sepsis due to Enterobacter species [facility]) Active Problems:   Paroxysmal atrial fibrillation (HCC)   Sinus bradycardia   Hypothyroidism   First degree AV block   Acute on chronic anemia   Acute pancreatitis without infection or necrosis   Bradycardia   Bacteremia   Hypoglycemia without diagnosis of diabetes mellitus  Sepsis secondary to Enterobacter bacteremia secondary to UTI: She is being treated with cefepime and IVFs. She is much more alert this morning. Will continue antibiotics and IVFs today. - Continue cefepime - Follow-up blood culture susceptibilities - D5NS x 10 additional hours today @100  mL/hr  Acute pancreatitis: Unclear cause.  - Continue IVFs as above  AKI: Likely due to urosepsis. Will follow-up BMP this morning.   Bradycardia/atrial fibrillation: HR continues to improve. Cardiology following and recommend restarting amiodarone today. Will continue holding metoprolol.  - Restart home amiodarone 100 mg daily. - Hold metoprolol  Hypothyroidism:  - Continue levothyroxine 75 mcg daily  Hypoglycemia without diabetes: BSs within acceptable limits while getting D5NS. Will continue to monitor while on D5NS.   Dispo: Anticipated discharge in approximately 2-3 days pending clinical improvement.  Carroll Sage, MD 09/09/2018, 7:31 AM Pager: [phone]

## 2018-09-09 NOTE — Progress Notes (Signed)
Progress Note  Patient Name: Maureen Stout Date of Encounter: 09/09/2018  Primary Cardiologist: Shirlee More, MD   Subjective   Remains confused but much more alert.  She denies chest pain or dyspnea.  Inpatient Medications    Scheduled Meds:  atorvastatin  10 mg Oral Daily   calcium-vitamin D   Oral BID   feeding supplement (ENSURE ENLIVE)  237 mL Oral BID BM   heparin injection (subcutaneous)  5,000 Units Subcutaneous Q8H   levothyroxine  75 mcg Oral QAC breakfast   oxybutynin  5 mg Oral Daily   Continuous Infusions:  ceFEPime (MAXIPIME) IV 2 g (09/08/18 2127)   dextrose 5% lactated ringers 125 mL/hr at 09/09/18 0105   PRN Meds: acetaminophen **OR** acetaminophen, calcium carbonate, promethazine, senna-docusate, sodium chloride flush   Vital Signs    Vitals:   09/08/18 2053 09/09/18 0046 09/09/18 0417 09/09/18 0642  BP: (!) 128/57 134/89 (!) 128/54 (!) 107/51  Pulse: 61 67 (!) 59 (!) 50  Resp: 15 (!) 35 16 17  Temp: (!) 96.3 F (35.7 C) (!) 95.9 F (35.5 C) (!) 95.7 F (35.4 C) (!) 95.5 F (35.3 C)  TempSrc: Bladder Bladder Bladder   SpO2: 96%  94% 97%  Weight:      Height:        Intake/Output Summary (Last 24 hours) at 09/09/2018 0839 Last data filed at 09/08/2018 1900 Gross per 24 hour  Intake --  Output 300 ml  Net -300 ml   Last 3 Weights 09/08/2018 09/07/2018 09/06/2018  Weight (lbs) 187 lb 6.3 oz 181 lb 3.5 oz 179 lb 1.6 oz  Weight (kg) 85 kg 82.2 kg 81.239 kg      Telemetry     Sinus bradycardia with PVCs- Personally Reviewed  Physical Exam   GEN: WD, alert, confused Neck: no JVD Cardiac: mildly bradycardic, regular Respiratory: Mildly diminished BS bases GI: Soft, NT MS: 1+ ankle edema Neuro:  confused; moves all ext   Labs    Chemistry Recent Labs  Lab 09/06/18 1424 09/06/18 1443 09/06/18 1444 09/07/18 0554 09/08/18 0529  NA 141  --  141 140 137  K 4.8  --  4.7 5.0 5.0  CL 109  --   --  108 107  CO2 21*  --   --   20* 22  GLUCOSE 80  --   --  72 81  BUN 44*  --   --  46* 50*  CREATININE 2.86* 2.80*  --  3.05* 3.24*  CALCIUM 9.5  --   --  9.3 8.9  PROT 6.2*  --   --  5.4*  --   ALBUMIN 3.1*  --   --  2.6*  --   AST 34  --   --  30  --   ALT 43  --   --  37  --   ALKPHOS 91  --   --  80  --   BILITOT 0.5  --   --  0.4  --   GFRNONAA 15*  --   --  13* 12*  GFRAA 17*  --   --  16* 14*  ANIONGAP 11  --   --  12 8     Hematology Recent Labs  Lab 09/06/18 1424 09/06/18 1444 09/07/18 0554 09/08/18 0529  WBC 4.8  --  6.0 11.3*  RBC 2.95*  --  3.01* 2.74*  HGB 8.8* 9.2* 9.0* 8.3*  HCT 28.4* 27.0* 28.6* 26.1*  MCV  96.3  --  95.0 95.3  MCH 29.8  --  29.9 30.3  MCHC 31.0  --  31.5 31.8  RDW 17.3*  --  17.4* 17.6*  PLT 186  --  168 169    Cardiac Enzymes Recent Labs  Lab 09/06/18 1424 09/06/18 1936 09/07/18 0005 09/07/18 0554  TROPONINI <0.03 <0.03 <0.03 <0.03    BNP Recent Labs  Lab 09/06/18 1936  BNP 192.9*     Radiology    Ct Abdomen Pelvis Wo Contrast  Result Date: 09/07/2018 CLINICAL DATA:  85 year old female with dizziness, bradycardia, weakness, anterior abdominal pain. EXAM: CT ABDOMEN AND PELVIS WITHOUT CONTRAST TECHNIQUE: Multidetector CT imaging of the abdomen and pelvis was performed following the standard protocol without IV contrast. COMPARISON:  CT Abdomen and Pelvis 03/25/2017. FINDINGS: Lower chest: Layering bilateral pleural effusions, small to moderate. Cardiomegaly which is progressed since 2018. No pericardial effusion. Extensive Calcified aortic atherosclerosis. Mild lung base atelectasis. Hepatobiliary: Small volume perihepatic free fluid with simple fluid density. Increased liver density might indicate amiodarone use. The gallbladder is distended with vicarious excretion of contrast. Although there is right upper quadrant inflammatory stranding, this does not appear intimately related to the gallbladder. Pancreas: The pancreas is indistinct, especially the  pancreatic tail, and there is inflammation about the tail and in the lesser sac. No pancreatic ductal dilatation is evident. No organized or drainable fluid collection. Spleen: Inflammation at the splenic hilum.  Otherwise negative. Adrenals/Urinary Tract: Normal adrenal glands. 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 No hydronephrosis. Small mildly hyperdense left renal upper pole cyst appears chronic. The proximal ureters seem decompressed. There are right side gonadal vein phleboliths. The urinary bladder is decompressed but thick-walled (13 millimeters series 3, image 78), and contains gas. Mild if any perivesical stranding. Stomach/Bowel: Diverticulosis throughout the large bowel. Although there are areas of pericolic fluid and stranding, especially in both gutters (series 3, image 45), there are intervening segments of noninflamed bowel (transverse colon image 52), and the inflammatory stranding might be emanating from the tail of the pancreas rather than the bowel. No pneumoperitoneum. Small volume of free fluid in the mesentery. No dilated small bowel. Negative terminal ileum. The gastric fundus is indistinct also in an area of fat stranding. The remaining stomach is within normal limits. There is confluent stranding also about the duodenum which may be secondarily inflamed. Vascular/Lymphatic: Vascular patency is not evaluated in the absence of IV contrast. Aortoiliac calcified atherosclerosis. Reproductive: Surgically absent uterus. Diminutive or absent ovaries. Other: Small volume simple appearing pelvic free fluid. Musculoskeletal: Scoliosis and degenerative changes throughout the spine. No acute osseous abnormality identified. IMPRESSION: 1. Inflammation and small volume free fluid in the abdomen and pelvis which is favored secondary to Acute Pancreatitis. However, there is diffuse large bowel diverticulosis and alternative diagnosis of acute diverticulitis is difficult to exclude.  But, portions of the stomach and duodenum also appear secondarily inflamed which favors pancreatitis. No free air or drainable fluid collection. 2. Thick-walled urinary bladder containing gas suspicious for superimposed UTI. No obstructive uropathy. 3. Small to moderate layering pleural effusions with lung base atelectasis. 4. Distended gallbladder with vicarious contrast excretion but no strong CT evidence of acute cholecystitis. 5.  Aortic Atherosclerosis (ICD10-I70.0). Electronically Signed   By: Genevie Ann M.D.   On: 09/07/2018 09:33   US Abdomen Limited Ruq  Result Date: 09/08/2018 CLINICAL DATA:  Pancreatitis EXAM: ULTRASOUND ABDOMEN LIMITED RIGHT UPPER QUADRANT COMPARISON:  CT 09/07/2018 FINDINGS: Gallbladder: Dilated gallbladder with small stones.  Normal wall thickness. Negative sonographic Murphy. Common bile duct: Diameter: 5.3 mm Liver: No focal lesion identified. Within normal limits in parenchymal echogenicity. Portal vein is patent on color Doppler imaging with normal direction of blood flow towards the liver. Small amount of ascites adjacent to the liver. Incidental note made of right pleural effusion IMPRESSION: 1. Dilated gallbladder containing small stones. Negative for acute cholecystitis 2. Small amount of ascites in the right upper quadrant. Right pleural effusion. Electronically Signed   By: Donavan Foil M.D.   On: 09/08/2018 21:05    Patient Profile     85 y.o. female w/ hx PAF, parox flutter on amio, metoprolol 25 mg bid and Eliquis, S brady, CKD III, DM, HTN, HLD, mild LVH and nl EF on echo 04/2017, was admitted 04/21 w/ dizziness & weakness, anemia, cards asked to see for bradycardia, HR 30s at times.   HR 50s at baseline. BB dose has been decreased in the past for HR 40s.   Assessment & Plan    1 bradycardia-bradycardia has improved since admission.  We will continue off of metoprolol.  Resume amiodarone 100 mg daily.  2 urosepsis-urine and blood growing gram-negative rods  (enterobacter).  Continue antibiotics per primary service.  Patient remains confused but much more alert today.  3 abdominal pain-question pancreatitis-management per primary care.  4 history of paroxysmal atrial fibrillation-patient remains in sinus rhythm.  Heart rate has improved.  We will continue off of metoprolol.  I will resume amiodarone 100 mg daily.  Would resume apixaban at preadmission dose at discharge.  Resume apixaban at discharge.  5 acute on chronic stage III kidney disease-continue to follow renal function.  For questions or updates, please contact [place] Please consult [email or web address] for contact info under        Signed, Kirk Ruths, MD  09/09/2018, 8:39 AM

## 2018-09-09 NOTE — Progress Notes (Signed)
Rehab Admissions Coordinator Note:  Patient was screened by Cleatrice Burke for appropriateness for an Inpatient Acute Rehab Consult per PT recommendation.  At this time, we are recommending Inpatient Rehab consult if you would like pt considered for an inpt rehab admit. Please place consult order.  Cleatrice Burke 09/09/2018, 10:48 AM  I can be reached at [phone].

## 2018-09-10 DIAGNOSIS — N183 Chronic kidney disease, stage 3 (moderate): Secondary | ICD-10-CM

## 2018-09-10 DIAGNOSIS — A4159 Other Gram-negative sepsis: Principal | ICD-10-CM

## 2018-09-10 LAB — CBC WITH DIFFERENTIAL/PLATELET
Abs Immature Granulocytes: 0.07 10*3/uL (ref 0.00–0.07)
Basophils Absolute: 0 10*3/uL (ref 0.0–0.1)
Basophils Relative: 0 %
Eosinophils Absolute: 0 10*3/uL (ref 0.0–0.5)
Eosinophils Relative: 0 %
HCT: 22.4 % — ABNORMAL LOW (ref 36.0–46.0)
Hemoglobin: 7.1 g/dL — ABNORMAL LOW (ref 12.0–15.0)
Immature Granulocytes: 1 %
Lymphocytes Relative: 9 %
Lymphs Abs: 0.7 10*3/uL (ref 0.7–4.0)
MCH: 30.3 pg (ref 26.0–34.0)
MCHC: 31.7 g/dL (ref 30.0–36.0)
MCV: 95.7 fL (ref 80.0–100.0)
Monocytes Absolute: 0.2 10*3/uL (ref 0.1–1.0)
Monocytes Relative: 3 %
Neutro Abs: 7 10*3/uL (ref 1.7–7.7)
Neutrophils Relative %: 87 %
Platelets: 108 10*3/uL — ABNORMAL LOW (ref 150–400)
RBC: 2.34 MIL/uL — ABNORMAL LOW (ref 3.87–5.11)
RDW: 18 % — ABNORMAL HIGH (ref 11.5–15.5)
WBC: 7.9 10*3/uL (ref 4.0–10.5)
nRBC: 0 % (ref 0.0–0.2)

## 2018-09-10 LAB — GLUCOSE, CAPILLARY
Glucose-Capillary: 93 mg/dL (ref 70–99)
Glucose-Capillary: 85 mg/dL (ref 70–99)
Glucose-Capillary: 94 mg/dL (ref 70–99)
Glucose-Capillary: 120 mg/dL — ABNORMAL HIGH (ref 70–99)
Glucose-Capillary: 78 mg/dL (ref 70–99)

## 2018-09-10 LAB — BASIC METABOLIC PANEL
Anion gap: 8 (ref 5–15)
BUN: 48 mg/dL — ABNORMAL HIGH (ref 8–23)
CO2: 21 mmol/L — ABNORMAL LOW (ref 22–32)
Calcium: 8.9 mg/dL (ref 8.9–10.3)
Chloride: 112 mmol/L — ABNORMAL HIGH (ref 98–111)
Creatinine, Ser: 3.14 mg/dL — ABNORMAL HIGH (ref 0.44–1.00)
GFR calc Af Amer: 15 mL/min — ABNORMAL LOW (ref >60)
GFR calc non Af Amer: 13 mL/min — ABNORMAL LOW (ref >60)
Glucose, Bld: 99 mg/dL (ref 70–99)
Potassium: 4.2 mmol/L (ref 3.5–5.1)
Sodium: 141 mmol/L (ref 135–145)

## 2018-09-10 NOTE — Progress Notes (Signed)
Progress Note  Patient Name: Maureen Stout Date of Encounter: 09/10/2018  Primary Cardiologist: Shirlee More, MD   Subjective   To have short conversations.  In no acute distress.  Inpatient Medications    Scheduled Meds:  amiodarone  100 mg Oral Daily   atorvastatin  10 mg Oral Daily   calcium-vitamin D   Oral BID   feeding supplement (ENSURE ENLIVE)  237 mL Oral BID BM   heparin injection (subcutaneous)  5,000 Units Subcutaneous Q8H   levothyroxine  75 mcg Oral QAC breakfast   oxybutynin  5 mg Oral Daily   Continuous Infusions:  ceFEPime (MAXIPIME) IV 2 g (09/09/18 2314)   PRN Meds: acetaminophen **OR** acetaminophen, calcium carbonate, promethazine, senna-docusate, sodium chloride flush   Vital Signs    Vitals:   09/09/18 2001 09/10/18 0353 09/10/18 0500 09/10/18 0710  BP: 104/62   (!) 115/56  Pulse: (!) 54 (!) 59 73 63  Resp: 16 15 (!) 21 (!) 24  Temp:  (!) 96.6 F (35.9 C) (!) 97.5 F (36.4 C) (!) 96.8 F (36 C)  TempSrc: Bladder   Bladder  SpO2: 100% 96% (!) 85% 94%  Weight:      Height:        Intake/Output Summary (Last 24 hours) at 09/10/2018 1123 Last data filed at 09/10/2018 0700 Gross per 24 hour  Intake 237 ml  Output 1000 ml  Net -763 ml   Last 3 Weights 09/08/2018 09/07/2018 09/06/2018  Weight (lbs) 187 lb 6.3 oz 181 lb 3.5 oz 179 lb 1.6 oz  Weight (kg) 85 kg 82.2 kg 81.239 kg      Telemetry    Sinus rhythm personally Reviewed  Physical Exam    GEN: Well nourished, well developed, in no acute distress  HEENT: normal  Neck: no JVD, carotid bruits, or masses Cardiac: RRR; no murmurs, rubs, or gallops,no edema  Respiratory:  clear to auscultation bilaterally, normal work of breathing GI: soft, nontender, nondistended, + BS MS: no deformity or atrophy  Skin: warm and dry,  Neuro:  Strength and sensation are intact Psych: euthymic mood, full affect   Labs    Chemistry Recent Labs  Lab 09/06/18 1424  09/07/18 0554  09/08/18 0529 09/09/18 0923 09/10/18 0610  NA 141   < > 140 137 140 141  K 4.8   < > 5.0 5.0 4.5 4.2  CL 109  --  108 107 110 112*  CO2 21*  --  20* 22 21* 21*  GLUCOSE 80  --  72 81 92 99  BUN 44*  --  46* 50* 48* 48*  CREATININE 2.86*   < > 3.05* 3.24* 3.33* 3.14*  CALCIUM 9.5  --  9.3 8.9 9.1 8.9  PROT 6.2*  --  5.4*  --   --   --   ALBUMIN 3.1*  --  2.6*  --   --   --   AST 34  --  30  --   --   --   ALT 43  --  37  --   --   --   ALKPHOS 91  --  80  --   --   --   BILITOT 0.5  --  0.4  --   --   --   GFRNONAA 15*  --  13* 12* 12* 13*  GFRAA 17*  --  16* 14* 14* 15*  ANIONGAP 11  --  12 8 9 8    < > =  values in this interval not displayed.     Hematology Recent Labs  Lab 09/08/18 0529 09/09/18 0923 09/10/18 0610  WBC 11.3* 9.6 7.9  RBC 2.74* 2.72* 2.34*  HGB 8.3* 8.2* 7.1*  HCT 26.1* 25.8* 22.4*  MCV 95.3 94.9 95.7  MCH 30.3 30.1 30.3  MCHC 31.8 31.8 31.7  RDW 17.6* 17.9* 18.0*  PLT 169 138* 108*    Cardiac Enzymes Recent Labs  Lab 09/06/18 1424 09/06/18 1936 09/07/18 0005 09/07/18 0554  TROPONINI <0.03 <0.03 <0.03 <0.03    BNP Recent Labs  Lab 09/06/18 1936  BNP 192.9*     Radiology    US Abdomen Limited Ruq  Result Date: 09/08/2018 CLINICAL DATA:  Pancreatitis EXAM: ULTRASOUND ABDOMEN LIMITED RIGHT UPPER QUADRANT COMPARISON:  CT 09/07/2018 FINDINGS: Gallbladder: Dilated gallbladder with small stones. Normal wall thickness. Negative sonographic Murphy. Common bile duct: Diameter: 5.3 mm Liver: No focal lesion identified. Within normal limits in parenchymal echogenicity. Portal vein is patent on color Doppler imaging with normal direction of blood flow towards the liver. Small amount of ascites adjacent to the liver. Incidental note made of right pleural effusion IMPRESSION: 1. Dilated gallbladder containing small stones. Negative for acute cholecystitis 2. Small amount of ascites in the right upper quadrant. Right pleural effusion. Electronically  Signed   By: Donavan Foil M.D.   On: 09/08/2018 21:05    Patient Profile     85 y.o. female w/ hx PAF, parox flutter on amio, metoprolol 25 mg bid and Eliquis, S brady, CKD III, DM, HTN, HLD, mild LVH and nl EF on echo 04/2017, was admitted 04/21 w/ dizziness & weakness, anemia, cards asked to see for bradycardia, HR 30s at times.   HR 50s at baseline. BB dose has been decreased in the past for HR 40s.   Assessment & Plan    1 bradycardia-fortunately her bradycardia is improved since admission.  Her amiodarone has been resumed.  We Nolie Bignell continue to hold on metoprolol.  2 urosepsis-neurovascular and urine.  Antibiotics per primary team.   3 pancreatitis-plan per primary team  4 history of paroxysmal atrial fibrillation-fortunately patient remains in sinus rhythm.  Amiodarone 100 mg has been restarted.  We Verlee Pope plan to restart Eliquis at discharge.    5 acute on chronic stage III kidney disease-follow renal function per primary team.  For questions or updates, please contact [place] Please consult [email or web address] for contact info under   [place] sign off.   Medication Recommendations: Amiodarone 100 mg, restart anticoagulation at discharge Other recommendations (labs, testing, etc): None Follow up as an outpatient: Sent to primary cardiologist to arrange follow-up     Signed, Annalie Wenner Meredith Leeds, MD  09/10/2018, 11:23 AM

## 2018-09-10 NOTE — Progress Notes (Signed)
Pt's temp 98.4. Paged on call MD at [phone]. Per MD, Continue Bair hugger for now. Will DC if temp >99.

## 2018-09-10 NOTE — Progress Notes (Signed)
   Subjective:  She is more alert this morning and is able to have a very brief conversation with one word answers. She does not have any acute complaints and does believe that the antibiotics are helping her.  Objective:  Vital signs in last 24 hours: Vitals:   09/09/18 2001 09/10/18 0353 09/10/18 0500 09/10/18 0710  BP: 104/62   (!) 115/56  Pulse: (!) 54 (!) 59 73 63  Resp: 16 15 (!) 21 (!) 24  Temp:  (!) 96.6 F (35.9 C) (!) 97.5 F (36.4 C) (!) 96.8 F (36 C)  TempSrc: Bladder   Bladder  SpO2: 100% 96% (!) 85% 94%  Weight:      Height:       General: Lying in bed in no acute distress. CV: Bradycardic in the 50s, normal rhythm Resp: CTAB, Fine crackles heard at the lung bases bilaterally MSK: Trace to 1+ pitting edema noted in the LEs bilaterally, no warmth or erythema appreciated.  Assessment/Plan:  Principal Problem:   Sepsis due to Enterobacter species [facility]) Active Problems:   Paroxysmal atrial fibrillation (HCC)   Sinus bradycardia   Hypothyroidism   First degree AV block   Acute on chronic anemia   Acute pancreatitis without infection or necrosis   Bradycardia   Bacteremia   Hypoglycemia without diagnosis of diabetes mellitus  Sepsis secondary to Enterobacter bacteremia secondary to UTI: She is being treated with cefepime and IVFs. She is more alert this morning and is able to have a very brief conversation with one word answers. Will plan on continuing cefepime for a total of 14 days and repeat blood cultures today. - Continue cefepime - Follow-up repeat blood cultures - Continue PT/OT - Follow-up CIR recommendations  Acute pancreatitis: Unsure how this fits into her sepsis picture. Will hold off on any additional IVFs as she is more alert and can likely take some PO intake. She also does not have any abdominal pain this morning.   AKI: Likely due to urosepsis. Creatinine stable this morning.  Bradycardia/atrial fibrillation:  - Continue home amiodarone  100 mg daily. - Will continue holding off on metoprolol per cardiology recs  Hypothyroidism:  - Continue levothyroxine 75 mcg daily  Dispo: Anticipated discharge in approximately 1-2 days pending CIR evaluation.   Carroll Sage, MD 09/10/2018, 10:40 AM Pager: [phone]

## 2018-09-11 DIAGNOSIS — R680 Hypothermia, not associated with low environmental temperature: Secondary | ICD-10-CM

## 2018-09-11 DIAGNOSIS — N321 Vesicointestinal fistula: Secondary | ICD-10-CM

## 2018-09-11 LAB — GLUCOSE, CAPILLARY
Glucose-Capillary: 103 mg/dL — ABNORMAL HIGH (ref 70–99)
Glucose-Capillary: 151 mg/dL — ABNORMAL HIGH (ref 70–99)
Glucose-Capillary: 73 mg/dL (ref 70–99)
Glucose-Capillary: 78 mg/dL (ref 70–99)
Glucose-Capillary: 88 mg/dL (ref 70–99)
Glucose-Capillary: 88 mg/dL (ref 70–99)
Glucose-Capillary: 94 mg/dL (ref 70–99)

## 2018-09-11 LAB — BASIC METABOLIC PANEL
Anion gap: 9 (ref 5–15)
BUN: 43 mg/dL — ABNORMAL HIGH (ref 8–23)
CO2: 22 mmol/L (ref 22–32)
Calcium: 9.1 mg/dL (ref 8.9–10.3)
Chloride: 112 mmol/L — ABNORMAL HIGH (ref 98–111)
Creatinine, Ser: 2.98 mg/dL — ABNORMAL HIGH (ref 0.44–1.00)
GFR calc Af Amer: 16 mL/min — ABNORMAL LOW (ref >60)
GFR calc non Af Amer: 14 mL/min — ABNORMAL LOW (ref >60)
Glucose, Bld: 87 mg/dL (ref 70–99)
Potassium: 4 mmol/L (ref 3.5–5.1)
Sodium: 143 mmol/L (ref 135–145)

## 2018-09-11 NOTE — Progress Notes (Signed)
I re-evaluated Maureen Stout.  She is much more alert this afternoon as compared to this morning.  She is able to tell me her name and the current year.  Does not know where she is at.  I explained to her that she was in the hospital she stated "[place] an airport".  She tells me that "Maureen Stout lied to her" when I asked her who Maureen Stout is she states "she's a friend".  She states that she is feeling fine and would like to go home.  She denies abdominal pain.  Per nursing staff Maureen Stout attempted to pull out her urinary catheter and thus mittens were placed.  She states that he has otherwise been calm and not had any acute complaints.  Blood pressure (!) 122/50, pulse 65, temperature 99.1 F (37.3 C), temperature source Bladder, resp. rate 15, height 5\' 6"  (1.676 m), weight 86.2 kg, SpO2 97 %. General: Lying in bed in no acute distress with mittens on. Cardiovascular: Normal rate, regular rhythm Respiratory: Normal work of breathing, normal oxygen saturations on room air Abdominal: Nontender to palpation, soft, no masses appreciated  Assessment/plan: Does appear to be much more alert this morning but remains confused.  I do believe that her altered mental status is multifactorial in nature and likely due to a combination of her GNR bacteremia and acute delirium.  Will continue IV antibiotics at this time and hold off on further imaging.

## 2018-09-11 NOTE — Progress Notes (Signed)
Patients temp back down 95.5 F, patient placed on Bair hugger. Will continue to monitor patient.

## 2018-09-11 NOTE — Progress Notes (Signed)
   Subjective: Patient was seen laying in bed, appeared very tired and only mumbled in response. No acute events overnight. Nurse reports that patient has not slept for the past 2 days, she did have some ensures last night.   Objective:  Vital signs in last 24 hours: Vitals:   09/11/18 0640 09/11/18 0706 09/11/18 0722 09/11/18 0736  BP:      Pulse: (!) 57  (!) 57 (!) 59  Resp:      Temp: (!) 96.4 F (35.8 C) (!) 97 F (36.1 C) (!) 97.3 F (36.3 C) (!) 97.5 F (36.4 C)  TempSrc: Bladder Bladder    SpO2:   98% 98%  Weight:      Height:        General: Tired appearing elderly female, NAD, laying in bed, Bair hugger in place Cardiac: RRR, no m/r/g Pulmonary: Crackles at the lung bases bilaterally Abdomen: Soft, non-tender, non-distended Extremity: Trace LE edema noted bilaterally, unchanged from yesterday Psychiatry: Tired appearing, minimally responsive but did mumble in responses    Assessment/Plan:  Principal Problem:   Sepsis due to Enterobacter species [facility]) Active Problems:   Paroxysmal atrial fibrillation (HCC)   Sinus bradycardia   Hypothyroidism   First degree AV block   Acute on chronic anemia   Acute pancreatitis without infection or necrosis   Bradycardia   Bacteremia   Hypoglycemia without diagnosis of diabetes mellitus  Sepsis secondary to Enterobacter bacteremia secondary to UTI:She is being treated with cefepime and IVFs. Interestingly she does appear to be more lethargic today and continues to have hypothermia. We are awaiting repeat blood cultures. I will check up on her this afternoon. If she continues to be lethargic will plan on obtaining a head CT to evaluate for intracranial abnormalities including intracerebral infection vs acute stroke.  - Continue Cefepime (stop date: 09/21/18) - Continue PT/OT - Follow-up CIR recommendations  Acute pancreatitis:Treated with IVFs. No abdominal pain noted on exam.   OMV:EHMCNO due to urosepsis. Creatinine  improved this morning with IVFs.  Bradycardia/atrial fibrillation:  - Continue home amiodarone 100 mg daily. - Holding metoprolol--will not resume at discharge.   Hypothyroidism:  - Continue levothyroxine 75 mcg daily  FEN/GI: Advance diet as tolerated; currently tolerating ensures DVT PPX: heparin CODE STATUS: DNR  Dispo: Anticipated discharge pending clinical improvement.   Carroll Sage, MD 09/11/2018, 8:10 AM Pager: [phone]

## 2018-09-11 NOTE — Progress Notes (Signed)
  Date: 09/11/2018  Patient name: Maureen Stout  Medical record number: [id number]  Date of birth: [birth year]   I have seen and evaluated this patient and I have discussed the plan of care with the house staff. Please see their note for complete details. 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  She continues on IV cefepime with cultures confirming sensitivity to cefepime and repeat cultures negative so far.  Unfortunately, she continues to have hypothermia requiring ongoing temperature support.  She also was quite somnolent this morning, but improved on reevaluation in the afternoon, but still confused.  Her encephalopathy is consistent with delirium, likely related to her infection.  She should continue on IV antibiotics until her temperature stabilizes without support.  We have held her metoprolol and her bradycardia has improved.  TSH was elevated on admission, but free T4 was in the normal range and we have continued her home dose of levothyroxine.  If she continues to have difficulty with hypothermia or bradycardia, we can recheck her TSH, T3, and free T4 and consider additional thyroid support during her acute illness.  Lenice Pressman, M.D., Ph.D. 09/11/2018, 2:20 PM

## 2018-09-12 ENCOUNTER — Inpatient Hospital Stay (HOSPITAL_COMMUNITY): Payer: Medicare PPO

## 2018-09-12 DIAGNOSIS — D696 Thrombocytopenia, unspecified: Secondary | ICD-10-CM

## 2018-09-12 LAB — CBC WITH DIFFERENTIAL/PLATELET
Abs Immature Granulocytes: 0.15 10*3/uL — ABNORMAL HIGH (ref 0.00–0.07)
Basophils Absolute: 0 10*3/uL (ref 0.0–0.1)
Basophils Relative: 0 %
Eosinophils Absolute: 0 10*3/uL (ref 0.0–0.5)
Eosinophils Relative: 0 %
HCT: 22.7 % — ABNORMAL LOW (ref 36.0–46.0)
Hemoglobin: 7.2 g/dL — ABNORMAL LOW (ref 12.0–15.0)
Immature Granulocytes: 2 %
Lymphocytes Relative: 12 %
Lymphs Abs: 0.9 10*3/uL (ref 0.7–4.0)
MCH: 30.5 pg (ref 26.0–34.0)
MCHC: 31.7 g/dL (ref 30.0–36.0)
MCV: 96.2 fL (ref 80.0–100.0)
Monocytes Absolute: 0.4 10*3/uL (ref 0.1–1.0)
Monocytes Relative: 5 %
Neutro Abs: 5.9 10*3/uL (ref 1.7–7.7)
Neutrophils Relative %: 81 %
Platelets: 98 10*3/uL — ABNORMAL LOW (ref 150–400)
RBC: 2.36 MIL/uL — ABNORMAL LOW (ref 3.87–5.11)
RDW: 18.1 % — ABNORMAL HIGH (ref 11.5–15.5)
WBC: 7.4 10*3/uL (ref 4.0–10.5)
nRBC: 0.3 % — ABNORMAL HIGH (ref 0.0–0.2)

## 2018-09-12 LAB — CULTURE, BLOOD (ROUTINE X 2)
Culture: NO GROWTH
Special Requests: ADEQUATE

## 2018-09-12 LAB — GLUCOSE, CAPILLARY
Glucose-Capillary: 75 mg/dL (ref 70–99)
Glucose-Capillary: 77 mg/dL (ref 70–99)
Glucose-Capillary: 122 mg/dL — ABNORMAL HIGH (ref 70–99)
Glucose-Capillary: 123 mg/dL — ABNORMAL HIGH (ref 70–99)

## 2018-09-12 LAB — RETICULOCYTES
Immature Retic Fract: 14.4 % (ref 2.3–15.9)
RBC.: 2.37 MIL/uL — ABNORMAL LOW (ref 3.87–5.11)
Retic Count, Absolute: 34.4 10*3/uL (ref 19.0–186.0)
Retic Ct Pct: 1.5 % (ref 0.4–3.1)

## 2018-09-12 LAB — BASIC METABOLIC PANEL
Anion gap: 8 (ref 5–15)
BUN: 43 mg/dL — ABNORMAL HIGH (ref 8–23)
CO2: 23 mmol/L (ref 22–32)
Calcium: 9 mg/dL (ref 8.9–10.3)
Chloride: 113 mmol/L — ABNORMAL HIGH (ref 98–111)
Creatinine, Ser: 2.89 mg/dL — ABNORMAL HIGH (ref 0.44–1.00)
GFR calc Af Amer: 17 mL/min — ABNORMAL LOW (ref >60)
GFR calc non Af Amer: 14 mL/min — ABNORMAL LOW (ref >60)
Glucose, Bld: 75 mg/dL (ref 70–99)
Potassium: 4 mmol/L (ref 3.5–5.1)
Sodium: 144 mmol/L (ref 135–145)

## 2018-09-12 NOTE — Progress Notes (Signed)
Occupational Therapy Treatment Patient Details Name: Maureen Stout MRN: [id number] DOB: 1931-06-02 Today's Date: 09/12/2018    History of present illness 85 yo admitted with dizziness and weakness with several falls. Pt with bradycardia, aV block, urosepsis. PMhx: DM, Afib, HTN, bradycardia   OT comments  Pt progressing toward goals. Pt oriented to self, place, time and situation during today's session. She is difficult to understand at times when she is speaking. Pt requires +2 mod assist for bed mobility and for functional transfer from bed to recliner. Able to wash face with supervision while sitting EOB. Continue to recommend CIR for d/c plan. Will continue to follow acutely.  Follow Up Recommendations  CIR    Equipment Recommendations  3 in 1 bedside commode;Wheelchair (measurements OT);Wheelchair cushion (measurements OT)    Recommendations for Other Services      Precautions / Restrictions Precautions Precautions: Fall Precaution Comments: picks at skin and lines, multiple UE lacerations       Mobility Bed Mobility Overal bed mobility: Needs Assistance Bed Mobility: Supine to Sit     Supine to sit: +2 for physical assistance;Mod assist     General bed mobility comments: Pt able to assist by reaching for bed rail and bringing LEs toward EOB.  Therapists using bed pad to assist with pivoting hips and scooting hips EOB.   Transfers Overall transfer level: Needs assistance Equipment used: Rolling walker (2 wheeled) Transfers: Sit to/from [facility] Sit to Stand: +2 physical assistance;Mod assist;From elevated surface Stand pivot transfers: +2 physical assistance;Mod assist;From elevated surface       General transfer comment: +2 mod assist to attempt stand with RW, however pt with flexed posture and posterior lean and unable to obtain fully erect posture.  SPT with +2 mod assist without use of RW from bed to recliner.     Balance Overall balance  assessment: Needs assistance Sitting-balance support: No upper extremity supported;Feet supported Sitting balance-Leahy Scale: Fair     Standing balance support: Bilateral upper extremity supported Standing balance-Leahy Scale: Poor Standing balance comment: bil UE supported on RW, posterior lean                           ADL either performed or assessed with clinical judgement   ADL Overall ADL's : Needs assistance/impaired     Grooming: Wash/dry face;Supervision/safety;Sitting       Lower Body Bathing: Total assistance;Bed level           Toilet Transfer: +2 for physical assistance;Moderate assistance Toilet Transfer Details (indicate cue type and reason): simulated with SPT from bed to recliner         Functional mobility during ADLs: +2 for physical assistance;Moderate assistance General ADL Comments: Pt sat EOB to wash face. While sitting EOB, she states, "this is a bunch of crap." She initially was agreeable to brushing teeth once in chair. However, after she had transferred to chair she stated, "I'm not brushing teeth. I don't have any teeth." Therapist informed her that she does have teeth but pt continued to decline.      Vision       Perception     Praxis      Cognition Arousal/Alertness: Awake/alert Behavior During Therapy: Flat affect Overall Cognitive Status: Impaired/Different from baseline Area of Impairment: Memory;Following commands;Problem solving;Safety/judgement;Attention                   Current Attention Level: Focused Memory: Decreased short-term memory  Following Commands: Follows one step commands inconsistently;Follows one step commands with increased time Safety/Judgement: Decreased awareness of safety;Decreased awareness of deficits   Problem Solving: Slow processing;Requires verbal cues          Exercises     Shoulder Instructions       General Comments      Pertinent Vitals/ Pain       Pain  Assessment: No/denies pain  Home Living                                          Prior Functioning/Environment              Frequency  Min 3X/week        Progress Toward Goals  OT Goals(current goals can now be found in the care plan section)  Progress towards OT goals: Progressing toward goals  Acute Rehab OT Goals Patient Stated Goal: return home OT Goal Formulation: Patient unable to participate in goal setting Time For Goal Achievement: 09/23/18 Potential to Achieve Goals: Good ADL Goals Pt Will Perform Grooming: with supervision;sitting Pt Will Transfer to Toilet: with min assist;bedside commode;stand pivot transfer Additional ADL Goal #1: pt will complete bed mobility min (A) as precursor to adls. Additional ADL Goal #2: pt will follow 2 step command during session 2 out 3 trials.  Plan Discharge plan remains appropriate;Frequency remains appropriate    Co-evaluation    PT/OT/SLP Co-Evaluation/Treatment: Yes Reason for Co-Treatment: Necessary to address cognition/behavior during functional activity;For patient/therapist safety;To address functional/ADL transfers   OT goals addressed during session: ADL's and self-care;Other (comment)(functional transfer)      AM-PAC OT "6 Clicks" Daily Activity     Outcome Measure   Help from another person eating meals?: A Lot Help from another person taking care of personal grooming?: A Little Help from another person toileting, which includes using toliet, bedpan, or urinal?: A Lot Help from another person bathing (including washing, rinsing, drying)?: A Lot Help from another person to put on and taking off regular upper body clothing?: A Lot Help from another person to put on and taking off regular lower body clothing?: Total 6 Click Score: 12    End of Session Equipment Utilized During Treatment: Rolling walker;Gait belt  OT Visit Diagnosis: Unsteadiness on feet (R26.81);Muscle weakness  (generalized) (M62.81)   Activity Tolerance Patient tolerated treatment well   Patient Left in chair;with call bell/phone within reach;with chair alarm set;with nursing/sitter in room   Nurse Communication Mobility status        Time: 7121-9758 OT Time Calculation (min): 24 min  Charges: OT General Charges $OT Visit: 1 Visit OT Treatments $Self Care/Home Management : 8-22 mins    Darrol Jump OTR/L [facility] [place] [phone] 09/12/2018, 3:39 PM

## 2018-09-12 NOTE — Progress Notes (Signed)
   Subjective: Patient was seen and evaluated at bedside on morning rounds. She is somnolent. Ms. Windle Guard opened her eyes when called her multiple times but only answers some of questions with 1-2 words. When I ask her if she knows where she is at, she mentions [place]. No acute complaints.  Objective:  Vital signs in last 24 hours: Vitals:   09/11/18 1603 09/11/18 2001 09/11/18 2337 09/12/18 0351  BP: (!) 129/57 (!) 131/52 (!) 131/54 (!) 127/54  Pulse: 65 77 60 71  Resp: 18 (!) 24 13 19   Temp: (!) 97.3 F (36.3 C) 99.3 F (37.4 C) 97.9 F (36.6 C) 99.1 F (37.3 C)  TempSrc: Bladder Bladder Bladder Bladder  SpO2: 92% 93% 100% 93%  Weight:      Height:       Physical exam: Vital signs reviewed, nursing note reviewed General: Patient looks ill, is somnolent CV: RRR, normal S1-S2, JVP is elevated Pulmonary exam: Has normal work of breathing, has some bibasilar crackle, no wheezing Abdomen: Is soft, BS are present, no tenderness Extremities: Pulses are present, has lateral 2+ pitting/nonpitting lower extremity edema  Assessment/Plan:  Principal Problem:   Sepsis due to Enterobacter species [facility]) Active Problems:   Paroxysmal atrial fibrillation (HCC)   Sinus bradycardia   Hypothyroidism   First degree AV block   Acute on chronic anemia   Acute pancreatitis without infection or necrosis   Bradycardia   Bacteremia   Hypoglycemia without diagnosis of diabetes mellitus  Sepsis secondary to Enterobacter bacteremia secondary to UTI: Patient with Hx or entero-vesicular fistula and chronic UTI, presented with weakness and sepsis.   She has been on IV cefepime. Stop date 09/21/18 Repeated BC yesterday was negative so far.  She has been altered on evaluation yesterday likely due to delirium. Unfortunately no improvement on mental status and she is again hypothermic this AM despite some improvement before.  She has normocytic anemia with Hb 7.2. Retic 1.5. Plt at 98. Can be  in setting of sepsis? trend CBC and will do Abdominal Ct to evaluate for any pancreatitis complication such as hemorrhage.   - Continue IV Cefepime (stop date: 09/21/18) - Continue PT/OT. (Recommended CIR) - Follow-up CIR recommendations -Repeat CT abdomen and pelvis without contrast to evaluate for any pancreatic pseudocyst, hemorrhage or any other complications - May do head Ct tomorrow if remain lethargic  -Patient's son was updated -CMP daily -CBC daily  Acute pancreatitis: CT abdomen showed finding suggestive of acute pancreatitis. Lipase 318.  Managed with IVFs and NPO. Abdomen is soft. No abdominal tenderness detected.  -Repeat CT abdomen pelvis x/o contrast to evaluate for any pseudocyst, pancreatic hemorrhage  -Checking liver function test  RXV:[id number] presented with AKI. (Cr: 3.05). No major improvement after IVFs. Today Cr 2.89. Will avoid further fluid having some volume overload on exam.  -Monitor kidney function daily  Bradycardia/atrial fibrillation: With Hx of Hypothyroidism and on Amiodarone and Metoprolol at home.  Likely medications induced or in setting of Hypothyroidism. Cardiology consulted. Holding Metoprolol.  Hreat rate improved. She has sinus rhythm currently. Will keep holding Amiodarone and monitor.  -Continuehome amiodarone 100 mg daily. - Keep holding Metoprolol at discharge.  -Continue cardiac monitoring  Hypothyroidism:  - Continue levothyroxine 75 mcg daily  FEN/GI: Advance diet as tolerated; currently tolerating ensures DVT ppx: heparin CODE status: DNR  Dispo: Discharge depends on clinical improvement  Dewayne Hatch, MD 09/12/2018, 6:41 AM Pager:[phone]

## 2018-09-12 NOTE — Progress Notes (Signed)
Physical Therapy Treatment Patient Details Name: Maureen Stout MRN: [id number] DOB: [birth year] Today's Date: 09/12/2018    History of Present Illness 85 yo admitted with dizziness and weakness with several falls. Pt with bradycardia, aV block, urosepsis. PMhx: DM, Afib, HTN, bradycardia    PT Comments    Pt is found slumped down in bed with multiple pillows. Pt is agreeable to work with therapy, is oriented x3 however is still confused about what is going on. Pt requires modA for bed mobility and modAx2 for transfer to recliner. If pt has 24 hour assist at d/c, plans for CIR rehab remain appropriate. PT will continue to follow acutely.    Follow Up Recommendations  CIR;Supervision/Assistance - 24 hour     Equipment Recommendations  3in1 (PT)    Recommendations for Other Services Rehab consult     Precautions / Restrictions Precautions Precautions: Fall Precaution Comments: picks at skin and lines, multiple UE lacerations    Mobility  Bed Mobility Overal bed mobility: Needs Assistance Bed Mobility: Supine to Sit     Supine to sit: +2 for physical assistance;Mod assist     General bed mobility comments: Pt able to assist by reaching for bed rail and bringing LEs toward EOB.  Therapists using bed pad to assist with pivoting hips and scooting hips EOB.   Transfers Overall transfer level: Needs assistance Equipment used: Rolling walker (2 wheeled) Transfers: Sit to/from [facility] Sit to Stand: +2 physical assistance;Mod assist;From elevated surface Stand pivot transfers: +2 physical assistance;Mod assist;From elevated surface       General transfer comment: +2 mod assist to attempt stand with RW, however pt with flexed posture and posterior lean and unable to obtain fully erect posture.  SPT with +2 mod assist without use of RW from bed to recliner.         Balance Overall balance assessment: Needs assistance Sitting-balance support: No upper  extremity supported;Feet supported Sitting balance-Leahy Scale: Fair     Standing balance support: Bilateral upper extremity supported Standing balance-Leahy Scale: Poor Standing balance comment: bil UE supported on RW, posterior lean                            Cognition Arousal/Alertness: Awake/alert Behavior During Therapy: Flat affect Overall Cognitive Status: Impaired/Different from baseline Area of Impairment: Memory;Following commands;Problem solving;Safety/judgement;Attention                   Current Attention Level: Focused Memory: Decreased short-term memory Following Commands: Follows one step commands inconsistently;Follows one step commands with increased time Safety/Judgement: Decreased awareness of safety;Decreased awareness of deficits   Problem Solving: Slow processing;Requires verbal cues           General Comments General comments (skin integrity, edema, etc.): VSS      Pertinent Vitals/Pain Pain Assessment: No/denies pain           PT Goals (current goals can now be found in the care plan section) Acute Rehab PT Goals Patient Stated Goal: return home PT Goal Formulation: With family Time For Goal Achievement: 09/23/18 Potential to Achieve Goals: Fair Progress towards PT goals: Progressing toward goals    Frequency    Min 3X/week      PT Plan Current plan remains appropriate    Co-evaluation PT/OT/SLP Co-Evaluation/Treatment: Yes Reason for Co-Treatment: Necessary to address cognition/behavior during functional activity PT goals addressed during session: Mobility/safety with mobility OT goals addressed during session: ADL's and  self-care;Other (comment)(functional transfer)      AM-PAC PT "6 Clicks" Mobility   Outcome Measure  Help needed turning from your back to your side while in a flat bed without using bedrails?: A Little Help needed moving from lying on your back to sitting on the side of a flat bed without  using bedrails?: A Lot Help needed moving to and from a bed to a chair (including a wheelchair)?: A Lot Help needed standing up from a chair using your arms (e.g., wheelchair or bedside chair)?: A Lot Help needed to walk in hospital room?: Total Help needed climbing 3-5 steps with a railing? : Total 6 Click Score: 11    End of Session Equipment Utilized During Treatment: Gait belt Activity Tolerance: Patient tolerated treatment well Patient left: in chair;with call bell/phone within reach;with chair alarm set;Other (comment) Nurse Communication: Mobility status;Precautions PT Visit Diagnosis: Other abnormalities of gait and mobility (R26.89);Muscle weakness (generalized) (M62.81);Difficulty in walking, not elsewhere classified (R26.2)     Time: 0737-1062 PT Time Calculation (min) (ACUTE ONLY): 25 min  Charges:  $Therapeutic Activity: 8-22 mins                     Darlynn Ricco B. Migdalia Dk PT, DPT Acute Rehabilitation Services Pager [phone] Office [phone]    [place] 09/12/2018, 4:18 PM

## 2018-09-12 NOTE — Progress Notes (Signed)
LAM for patient to call back to schedule televisit

## 2018-09-12 NOTE — Progress Notes (Addendum)
Inpatient Rehab Admissions:  Inpatient Rehab Consult received.  I met with patient at the bedside for rehabilitation assessment and to discuss goals and expectations of an inpatient rehab admission.  She is confused, states she just got back from [place] to look at a house.  Will need to f/u with family to determine whether 24/7 support is available at discharge prior to being considered for inpatient rehab.  I will attempt to contact family and f/u tomorrow.   Addendum: I was able to reach pt's son.  He is hopeful for inpatient rehab admission when pt medically ready, pending insurance authorization and bed availability. Will continue to follow.   Signed: Shann Medal, PT, DPT Admissions Coordinator [phone] 09/12/18  3:49 PM

## 2018-09-12 NOTE — Progress Notes (Signed)
  Date: 09/12/2018  Patient name: [place] record number: [id number]  Date of birth: 05-16-1932   I have seen and evaluated this patient and I have discussed the plan of care with the house staff. Please see their note for complete details. I concur with their findings with the following additions/corrections:   Unfortunately, she remains somnolent and only intermittently interactive.  Temperature was better overnight and she was maintaining her temperature without the bear hugger, but then it dropped again this morning to 96.1.  Considering the lack of progress despite 5 days of antibiotics, we will re-scan her abdomen today to evaluate for source control.  Of course, with a persistent enterovesicular fistula, complete source control will be essentially impossible, but it is possible she has developed an abscess or other complication.  Renal function has not improved.  If anything, she looks volume overloaded on exam, but I am hesitant to diurese her given her sepsis and acute pancreatitis.  She also has a progressive mild thrombocytopenia and anemia that has worsened since admission but stabilized compared to yesterday.  Reticulocyte count is low, suggesting poor production, likely related to her sepsis.  Her overall clinical picture is quite concerning, but I am hopeful that she may continue her gradual improvement.  Lenice Pressman, M.D., Ph.D. 09/12/2018, 3:51 PM

## 2018-09-13 DIAGNOSIS — Z9889 Other specified postprocedural states: Secondary | ICD-10-CM

## 2018-09-13 LAB — CBC
HCT: 21.6 % — ABNORMAL LOW (ref 36.0–46.0)
Hemoglobin: 6.7 g/dL — CL (ref 12.0–15.0)
MCH: 30.3 pg (ref 26.0–34.0)
MCHC: 31 g/dL (ref 30.0–36.0)
MCV: 97.7 fL (ref 80.0–100.0)
Platelets: 93 10*3/uL — ABNORMAL LOW (ref 150–400)
RBC: 2.21 MIL/uL — ABNORMAL LOW (ref 3.87–5.11)
RDW: 17.8 % — ABNORMAL HIGH (ref 11.5–15.5)
WBC: 7.1 10*3/uL (ref 4.0–10.5)
nRBC: 0.3 % — ABNORMAL HIGH (ref 0.0–0.2)

## 2018-09-13 LAB — GLUCOSE, CAPILLARY
Glucose-Capillary: 106 mg/dL — ABNORMAL HIGH (ref 70–99)
Glucose-Capillary: 109 mg/dL — ABNORMAL HIGH (ref 70–99)
Glucose-Capillary: 111 mg/dL — ABNORMAL HIGH (ref 70–99)
Glucose-Capillary: 126 mg/dL — ABNORMAL HIGH (ref 70–99)
Glucose-Capillary: 165 mg/dL — ABNORMAL HIGH (ref 70–99)
Glucose-Capillary: 193 mg/dL — ABNORMAL HIGH (ref 70–99)
Glucose-Capillary: 96 mg/dL (ref 70–99)

## 2018-09-13 LAB — COMPREHENSIVE METABOLIC PANEL
ALT: 61 U/L — ABNORMAL HIGH (ref 0–44)
AST: 60 U/L — ABNORMAL HIGH (ref 15–41)
Albumin: 1.8 g/dL — ABNORMAL LOW (ref 3.5–5.0)
Alkaline Phosphatase: 73 U/L (ref 38–126)
Anion gap: 7 (ref 5–15)
BUN: 39 mg/dL — ABNORMAL HIGH (ref 8–23)
CO2: 22 mmol/L (ref 22–32)
Calcium: 8.3 mg/dL — ABNORMAL LOW (ref 8.9–10.3)
Chloride: 116 mmol/L — ABNORMAL HIGH (ref 98–111)
Creatinine, Ser: 2.4 mg/dL — ABNORMAL HIGH (ref 0.44–1.00)
GFR calc Af Amer: 21 mL/min — ABNORMAL LOW (ref >60)
GFR calc non Af Amer: 18 mL/min — ABNORMAL LOW (ref >60)
Glucose, Bld: 90 mg/dL (ref 70–99)
Potassium: 3.5 mmol/L (ref 3.5–5.1)
Sodium: 145 mmol/L (ref 135–145)
Total Bilirubin: 0.5 mg/dL (ref 0.3–1.2)
Total Protein: 4.9 g/dL — ABNORMAL LOW (ref 6.5–8.1)

## 2018-09-13 LAB — HEMOGLOBIN AND HEMATOCRIT, BLOOD
HCT: 25.7 % — ABNORMAL LOW (ref 36.0–46.0)
Hemoglobin: 8.1 g/dL — ABNORMAL LOW (ref 12.0–15.0)

## 2018-09-13 LAB — ABO/RH: ABO/RH(D): O POS

## 2018-09-13 LAB — PREPARE RBC (CROSSMATCH)

## 2018-09-13 MED ORDER — SODIUM CHLORIDE 0.9% IV SOLUTION: Freq: Once | INTRAVENOUS | Status: DC

## 2018-09-13 NOTE — Progress Notes (Signed)
Internal Medicine Attending Attestation:   I have seen and evaluated this patient and I have discussed the plan of care with the house staff. Please see their note for complete details. I concur with their findings with the following additions/corrections:   I am reassured by her slow improvement.  Today she was much more alert and interactive, though still not oriented.  Her temperature has also improved gradually.  CT abdomen/pelvis yesterday did not show any abscess or other complication of her pyelonephritis, bacteremia, or pancreatitis.  We will continue her current therapy.  She does have a progressive anemia of unclear etiology with no obvious sources of blood loss.  Given her thrombocytopenia as well, most likely related to sepsis.  Will transfuse 1 unit PRBCs today.  Unlikely HIT as the cause of her thrombocytopenia.  Her 4 T score is 3, giving her a very low risk of HIT, and she has no evidence of thrombosis at this time.  Acute kidney injury is slowly improving, but she has ongoing anasarca with albumin less than 2.  We will follow her transfusion with a dose of furosemide to try to improve her anasarca.  Oda Kilts, MD 09/13/2018, 3:12 PM

## 2018-09-13 NOTE — Care Management Important Message (Signed)
Important Message  Patient Details  Name: Maureen Stout MRN: [id number] Date of Birth: December 15, 1931   Medicare Important Message Given:  Yes    Deantre Bourdon [address] 09/13/2018, 3:38 PM

## 2018-09-13 NOTE — TOC Progression Note (Signed)
Transition of Care [facility]) - Progression Note    Patient Details  Name: BETSI CRESPI MRN: [id number] Date of Birth: Sep 11, 1931  Transition of Care [facility]) CM/SW [place], [place] Phone Number: 09/13/2018, 2:15 PM  Clinical Narrative:    Patient's son, Vicente Serene  gave CSW verbal consent to discuss rehab options with his wife, Tomeca Helm. CSW briefly discussed possible placement at SNF if needed. Family agreed and recognized the patient will need rehab before returning home. Family's SNF preference [facility]. CSW will continue to follow and assist with discharge planning.    Expected Discharge Plan: [place] Barriers to Discharge: Continued Medical Work up  Expected Discharge Plan and Services Expected Discharge Plan: [place]   Discharge Planning Services: CM Consult   Living arrangements for the past 2 months: Single Family Home(has ramp to get into the back of home)                                       Social Determinants of Health (SDOH) Interventions    Readmission Risk Interventions No flowsheet data found.

## 2018-09-13 NOTE — Progress Notes (Signed)
Subjective: Patient was seen and evaluated at bedside on morning rounds. No acute events overnight. She mentions she is doing well. She is able to talk but is not oriented. She mentions that she is not in [place]. No acute complaints.   Objective:  Vital signs in last 24 hours: Vitals:   09/12/18 1231 09/12/18 2103 09/13/18 0516 09/13/18 0842  BP: 136/64 135/62 (!) 144/58 (!) 148/63  Pulse: 63 (!) 57 69 73  Resp: 13   12  Temp: (!) 96.1 F (35.6 C) (!) 97.4 F (36.3 C) 98.1 F (36.7 C)   TempSrc: Bladder Oral Oral   SpO2: 99% 95% 94% 90%  Weight:   85.8 kg   Height:       Physical exam: Vital signs reviewed, nursing note reviewed General: Patient is awake, lying in the bed in no acute distress. Appears comfortable CV: RRR, normal S1-S2, External jugular vein appears elevated Pulmonary exam: Has normal work of breathing, has some bibasilar crackle, no wheezing Abdomen: Is soft, BS are present, no tenderness Extremities: Pulses are present, has lateral 2+ pitting/nonpitting lower extremity edema  Assessment/Plan:  Principal Problem:   Sepsis due to Enterobacter species [facility]) Active Problems:   Paroxysmal atrial fibrillation (HCC)   Sinus bradycardia   Hypothyroidism   First degree AV block   Acute on chronic anemia   Acute pancreatitis without infection or necrosis   Bradycardia   Bacteremia   Hypoglycemia without diagnosis of diabetes mellitus  Sepsis secondary to Enterobacter bacteremia secondary to UTI: Patient with Hx or entero-vesicular fistula and chronic UTI, presented with weakness and sepsis.   She has been on IV cefepime. She showed some clinical improvement today.   No hypothermia this morning. Her mental status improved and she is awake, has started to eat and drink gradually and is able to talk. How ever she is confused and is not oriented. Repeated BC and UC was negative so far. Abdominal CT scan yesterday did not show inflammatory process or  abscess and no pancreatic pseudocyst. Kidney function showed some improvement today.  We will hold off of head CT scan for now. Patient's family was updated  - Continue IV Cefepime (stop date: 09/21/18) - Continue PT/OT. - Follow-up CIR recommendations -CMP daily -CBC daily  Normocytic anemia and thrombocytopenia: Hb at 6.7 and plt at 93 today. No evidence of bleeding.   Likely due to acute illness and sepsis. Less likely to be HIT.  Will transfuse RBC after family consent (patient is confused and is not able to give consent). She also has some volume overload on exam (seems to be both intravascular and extravascular. And had diffuse body wall edema, bilateral pleural effusions on CT). Will give Lasix with blood transfusion.  I contacted Ms. Sakai's son.  He is at work and I was not able to reach him and left voice message, I talked to [name]'s daughter in-law Jeani Hawking) and discussed our plan  for blood transfusion. I explained the risks and benefit and possible reaction to blood transfusion. She believes that his husband most likely agree with the plan but will also try to reach him and update him. I informed her that they may be contacted again for formal consent. I updated patient's RN.  -2 u p RBC (RN will contact family again for official consent)  -Lasix 40 mg IV once with blood transfusion -Post transfusion H&H  -CBC daily  TOI:ZTIWPYK showed some improvement today. Cr today down to 2.4 from 2.8 yesterday.  -  Monitor kidney function daily  Acute pancreatitis: Asymptomatic. Abdominal exam in unremarkable. Repeat CT abdomen with no significant change in mild acute pancreatitis. No pseudocysts reported. Has mild transaminates. Will monitor symptoms. No further management.  -PO diet -CMP dailly  Bradycardia/atrial fibrillation: Bradycardia resolved. Has currently sinus rhythm.Holding Metoprolol.  - Continuehome amiodarone 100 mg daily. -Keep holding Metoprolol at  discharge. -Continue cardiac monitoring  Hypothyroidism:  - Continue levothyroxine 75 mcg daily  Dispo: Anticipated discharge in approximately 2-3 days  Dewayne Hatch, MD 09/13/2018, 9:56 AM Pager: [phone]

## 2018-09-13 NOTE — Progress Notes (Signed)
Physical Therapy Treatment Patient Details Name: Maureen Stout MRN: [id number] DOB: 02-20-1932 Today's Date: 09/13/2018    History of Present Illness 85 yo admitted with dizziness and weakness with several falls. Pt with bradycardia, aV block, urosepsis. PMhx: DM, Afib, HTN, bradycardia    PT Comments    Pt agreeable to therapy, and has increased participation today. Pt continues to be limited in safe mobility by decreased cognition in the presence of decreased strength and stability. Pt able to follow multistep commands with increased time for processing. Pt currently modA for bed mobility and minA for sit>stand transfers to [place]. Pt able to achieve fully upright easily today x5 from elevated Stedy pads and stand with min guard assist for periods of up to 3 minutes. D/c plans continue to remain appropriate. PT will continue to follow acutely.    Follow Up Recommendations  CIR;Supervision/Assistance - 24 hour     Equipment Recommendations  3in1 (PT)    Recommendations for Other Services Rehab consult     Precautions / Restrictions Precautions Precautions: Fall Precaution Comments: picks at skin and lines, multiple UE lacerations    Mobility  Bed Mobility Overal bed mobility: Needs Assistance Bed Mobility: Rolling;Sidelying to Sit Rolling: Min assist Sidelying to sit: Mod assist       General bed mobility comments: minA to roll trunk to L side, and modax2 for bringing trunk to upright  Transfers Overall transfer level: Needs assistance Equipment used: Rolling walker (2 wheeled) Transfers: Sit to/from [facility] Sit to Stand: +2 physical assistance;From elevated surface;Min assist;Min guard         General transfer comment: min A for initial power up to [place], transferred to in front of chair and able to perform 5x sit>stand from elevated Stedy pads with static standin of up to 3 minutes and 2x each LE marching in place before pt became fatigued         Balance Overall balance assessment: Needs assistance Sitting-balance support: No upper extremity supported;Feet supported Sitting balance-Leahy Scale: Fair     Standing balance support: Bilateral upper extremity supported Standing balance-Leahy Scale: Poor Standing balance comment: bil UE supported on RW, posterior lean                            Cognition Arousal/Alertness: Awake/alert Behavior During Therapy: Flat affect Overall Cognitive Status: Impaired/Different from baseline Area of Impairment: Memory;Following commands;Problem solving;Safety/judgement;Attention                   Current Attention Level: Focused Memory: Decreased short-term memory Following Commands: Follows multi-step commands consistently;Follows multi-step commands with increased time Safety/Judgement: Decreased awareness of safety;Decreased awareness of deficits   Problem Solving: Slow processing;Requires verbal cues;Requires tactile cues           General Comments General comments (skin integrity, edema, etc.): VSS      Pertinent Vitals/Pain Pain Assessment: No/denies pain           PT Goals (current goals can now be found in the care plan section) Acute Rehab PT Goals Patient Stated Goal: return home PT Goal Formulation: With family Time For Goal Achievement: 09/23/18 Potential to Achieve Goals: Fair Progress towards PT goals: Progressing toward goals    Frequency    Min 3X/week      PT Plan Current plan remains appropriate       AM-PAC PT "6 Clicks" Mobility   Outcome Measure  Help needed turning from your  back to your side while in a flat bed without using bedrails?: A Little Help needed moving from lying on your back to sitting on the side of a flat bed without using bedrails?: A Lot Help needed moving to and from a bed to a chair (including a wheelchair)?: A Lot Help needed standing up from a chair using your arms (e.g., wheelchair or bedside  chair)?: A Lot Help needed to walk in hospital room?: Total Help needed climbing 3-5 steps with a railing? : Total 6 Click Score: 11    End of Session Equipment Utilized During Treatment: Gait belt Activity Tolerance: Patient tolerated treatment well Patient left: in chair;with call bell/phone within reach;with chair alarm set;Other (comment) Nurse Communication: Mobility status;Precautions PT Visit Diagnosis: Other abnormalities of gait and mobility (R26.89);Muscle weakness (generalized) (M62.81);Difficulty in walking, not elsewhere classified (R26.2)     Time: 5916-3846 PT Time Calculation (min) (ACUTE ONLY): 23 min  Charges:  $Therapeutic Exercise: 8-22 mins $Therapeutic Activity: 8-22 mins                     Shaunna Rosetti B. Migdalia Dk PT, DPT Acute Rehabilitation Services Pager [phone] Office [phone]    [place] 09/13/2018, 10:49 AM

## 2018-09-14 LAB — GLUCOSE, CAPILLARY
Glucose-Capillary: 108 mg/dL — ABNORMAL HIGH (ref 70–99)
Glucose-Capillary: 133 mg/dL — ABNORMAL HIGH (ref 70–99)
Glucose-Capillary: 136 mg/dL — ABNORMAL HIGH (ref 70–99)
Glucose-Capillary: 164 mg/dL — ABNORMAL HIGH (ref 70–99)
Glucose-Capillary: 94 mg/dL (ref 70–99)
Glucose-Capillary: 98 mg/dL (ref 70–99)

## 2018-09-14 LAB — COMPREHENSIVE METABOLIC PANEL
ALT: 60 U/L — ABNORMAL HIGH (ref 0–44)
AST: 50 U/L — ABNORMAL HIGH (ref 15–41)
Albumin: 1.9 g/dL — ABNORMAL LOW (ref 3.5–5.0)
Alkaline Phosphatase: 79 U/L (ref 38–126)
Anion gap: 10 (ref 5–15)
BUN: 40 mg/dL — ABNORMAL HIGH (ref 8–23)
CO2: 23 mmol/L (ref 22–32)
Calcium: 8.8 mg/dL — ABNORMAL LOW (ref 8.9–10.3)
Chloride: 110 mmol/L (ref 98–111)
Creatinine, Ser: 2.29 mg/dL — ABNORMAL HIGH (ref 0.44–1.00)
GFR calc Af Amer: 22 mL/min — ABNORMAL LOW (ref >60)
GFR calc non Af Amer: 19 mL/min — ABNORMAL LOW (ref >60)
Glucose, Bld: 90 mg/dL (ref 70–99)
Potassium: 3.6 mmol/L (ref 3.5–5.1)
Sodium: 143 mmol/L (ref 135–145)
Total Bilirubin: 0.5 mg/dL (ref 0.3–1.2)
Total Protein: 5 g/dL — ABNORMAL LOW (ref 6.5–8.1)

## 2018-09-14 LAB — CBC
HCT: 24.1 % — ABNORMAL LOW (ref 36.0–46.0)
Hemoglobin: 7.6 g/dL — ABNORMAL LOW (ref 12.0–15.0)
MCH: 29.7 pg (ref 26.0–34.0)
MCHC: 31.5 g/dL (ref 30.0–36.0)
MCV: 94.1 fL (ref 80.0–100.0)
Platelets: 100 10*3/uL — ABNORMAL LOW (ref 150–400)
RBC: 2.56 MIL/uL — ABNORMAL LOW (ref 3.87–5.11)
RDW: 17.6 % — ABNORMAL HIGH (ref 11.5–15.5)
WBC: 6.7 10*3/uL (ref 4.0–10.5)
nRBC: 0 % (ref 0.0–0.2)

## 2018-09-14 LAB — TYPE AND SCREEN
ABO/RH(D): O POS
Antibody Screen: NEGATIVE
Unit division: 0

## 2018-09-14 LAB — BPAM RBC
Blood Product Expiration Date: 202005052359
ISSUE DATE / TIME: 202004281057
Unit Type and Rh: 5100

## 2018-09-14 NOTE — Progress Notes (Signed)
Physical Therapy Treatment Patient Details Name: Maureen Stout MRN: [id number] DOB: 10-19-1931 Today's Date: 09/14/2018    History of Present Illness 85 yo admitted with dizziness and weakness with several falls. Pt with bradycardia, aV block, urosepsis. PMhx: DM, Afib, HTN, bradycardia    PT Comments    Pt is progressing both in cognition and mobility today, however continues to exhibit decreased strength, balance and endurance. Pt requires minA for bed mobility and minAx2 for sit>stand in [place], unable to make pivotal turns. D/c plans remain appropriate. PT will continue to follow acutely.    Follow Up Recommendations  CIR;Supervision/Assistance - 24 hour     Equipment Recommendations  3in1 (PT)    Recommendations for Other Services Rehab consult     Precautions / Restrictions Precautions Precautions: Fall Restrictions Weight Bearing Restrictions: No    Mobility  Bed Mobility Overal bed mobility: Needs Assistance       Supine to sit: Min assist     General bed mobility comments: min A for pulling up against therapist to come to EoB  Transfers Overall transfer level: Needs assistance Equipment used: Rolling walker (2 wheeled);Ambulation equipment used Transfers: Sit to/from Stand Sit to Stand: +2 physical assistance;Min assist Stand pivot transfers: Total assist       General transfer comment: performed x 4, from bed, stedy and chair        Balance Overall balance assessment: Needs assistance   Sitting balance-Leahy Scale: Fair       Standing balance-Leahy Scale: Poor Standing balance comment: can release stedy with one hand at sink with min assist for balance while engaged in grooming                            Cognition Arousal/Alertness: Awake/alert Behavior During Therapy: Flat affect Overall Cognitive Status: Impaired/Different from baseline Area of Impairment: Orientation;Memory;Safety/judgement;Problem solving;Attention                  Orientation Level: Disoriented to;Time;Situation Current Attention Level: Sustained Memory: Decreased short-term memory Following Commands: Follows one step commands with increased time;Follows multi-step commands inconsistently Safety/Judgement: Decreased awareness of safety;Decreased awareness of deficits   Problem Solving: Slow processing;Requires verbal cues;Requires tactile cues           General Comments General comments (skin integrity, edema, etc.): vss      Pertinent Vitals/Pain Pain Assessment: Faces Faces Pain Scale: No hurt           PT Goals (current goals can now be found in the care plan section) Acute Rehab PT Goals Patient Stated Goal: return home PT Goal Formulation: With patient Time For Goal Achievement: 09/23/18 Potential to Achieve Goals: Fair Progress towards PT goals: Progressing toward goals    Frequency    Min 3X/week      PT Plan Current plan remains appropriate    Co-evaluation PT/OT/SLP Co-Evaluation/Treatment: Yes Reason for Co-Treatment: For patient/therapist safety PT goals addressed during session: Mobility/safety with mobility;Balance;Proper use of DME OT goals addressed during session: ADL's and self-care      AM-PAC PT "6 Clicks" Mobility   Outcome Measure  Help needed turning from your back to your side while in a flat bed without using bedrails?: A Little Help needed moving from lying on your back to sitting on the side of a flat bed without using bedrails?: A Lot Help needed moving to and from a bed to a chair (including a wheelchair)?: A Lot Help needed standing  up from a chair using your arms (e.g., wheelchair or bedside chair)?: A Lot Help needed to walk in hospital room?: Total Help needed climbing 3-5 steps with a railing? : Total 6 Click Score: 11    End of Session Equipment Utilized During Treatment: Gait belt Activity Tolerance: Patient tolerated treatment well Patient left: in chair;with call  bell/phone within reach;with chair alarm set;Other (comment) Nurse Communication: Mobility status;Precautions PT Visit Diagnosis: Other abnormalities of gait and mobility (R26.89);Muscle weakness (generalized) (M62.81);Difficulty in walking, not elsewhere classified (R26.2)     Time: 9791-5041 PT Time Calculation (min) (ACUTE ONLY): 45 min  Charges:  $Therapeutic Activity: 8-22 mins                     Aries Kasa B. Migdalia Dk PT, DPT Acute Rehabilitation Services Pager [phone] Office [phone]    [place] 09/14/2018, 5:23 PM

## 2018-09-14 NOTE — Progress Notes (Signed)
Inpatient Rehab Admissions Coordinator:   Note pt back on bair hugger today.  Family on board for inpatient rehab admission pending insurance authorization and medical readiness.  Will continue to follow.   Shann Medal, PT, DPT Admissions Coordinator [phone] 09/14/18  12:14 PM

## 2018-09-14 NOTE — Progress Notes (Signed)
   Subjective: Patient was seen and evaluated at bedside on morning rounds. She is drinking her coffee and appears comfortable. Mentions that she does not feel good but doe not know why. No major complaint. She knows that she is in [facility] and can mention her son's name. I updated her about I have been in touch with them but they can not come and visit her due to COVID-19 situation although they wished to.   Objective:  Vital signs in last 24 hours: Vitals:   09/13/18 1955 09/13/18 2000 09/13/18 2353 09/14/18 0415  BP: 137/61  129/65 (!) 142/67  Pulse: 68 67 68 70  Resp:  (!) 21    Temp: 97.8 F (36.6 C) (!) 83.7 F (28.7 C) 98.2 F (36.8 C) 98.1 F (36.7 C)  TempSrc: Oral  Oral Oral  SpO2: 94% 92% 94% 94%  Weight:    87.7 kg  Height:       Physical exam: Vital signs reviewed, nursing note reviewed General: Patient is sitting in the bed and appears comfrotable . No acute distress. Appears comfortable CV: RRR, normal S1-S2, JVP is elevated but improved comparing to yesterday. Pulmonary exam: CTA bilaterally, no wheezing, no crackle Extremities: Pulses are present, has bilateral 2+ nonpitting lower extremity edema, extremities are warm Neurologic exam: Alert and oriented to people and place  Assessment/Plan:  Principal Problem:   Sepsis due to Enterobacter species [facility]) Active Problems:   Paroxysmal atrial fibrillation (HCC)   Sinus bradycardia   Hypothyroidism   First degree AV block   Acute on chronic anemia   Acute pancreatitis without infection or necrosis   Bradycardia   Bacteremia   Hypoglycemia without diagnosis of diabetes mellitus  Sepsis secondary to Enterobacter bacteremia secondary to UTI: Patient with Hx or entero-vesicular fistula and chronic UTI, presented with weakness and sepsis.   She has been on IVcefepime. She showed some clinical improvement today.  Gradually improving. Mental status is progressing and she is alert and oriented to  place and people now. She bair Music therapist now. One questionable episode of hypothermia at 83.6 recorded over night that seems to be an error when recorded.  How ever she is confused and is not oriented. Repeated BC negative for 3 days. We will continue IV antibiotic.  - ContinueIVCefepime (stop date: 09/21/18) - Continue PT/OT. - Follow-up CIR recommendations - CMP daily - CBC daily  Normocytic anemia and thrombocytopenia: Hb at 6.7 yesterday. S?P 1 un RBC transfusion. Post transfusion Hb:8.1. CBC today is pending. No evidence of bleeding.   Likely due to acute illness and sepsis. Was low risk for HIT, with T score of 3 and we continue sub q Hep.   -CBC daily  AKI: Cr 2.29 today, No major change in Cr today. GFR 18, is gradually improved since admission but still not at baseline.   -Monitor kidney function daily  Acute pancreatitis: Asymptomatic. Abdominal exam in unremarkable. Repeat CT abdomen with no significant change in mild acute pancreatitis and without complication. No further management.   Bradycardia/atrial fibrillation:  Holding Metoprolol. Bradycardia resolved. Has currently sinus rhythm.  - Continuehome amiodarone 100 mg daily. -Keep holdingMetoprolol at discharge. - Continue cardiac monitoring  Hypothyroidism:  - Continue levothyroxine 75 mcg daily   Dispo: Anticipated discharge in approximately 2-3 days Dewayne Hatch, MD 09/14/2018, 5:50 AM Pager: [phone]

## 2018-09-14 NOTE — Progress Notes (Signed)
  Date: 09/14/2018  Patient name: [place] record number: [id number]  Date of birth: 01/22/1932   I have seen and evaluated this patient and I have discussed the plan of care with the house staff. Please see their note for complete details. I concur with their findings with the following additions/corrections:   Back on the bear hugger this morning. Temperature recorded as 83.49F overnight, which is likely a typo. Mental status is improved. Hgb with appropriate response to transfusion, will continue to trend. Still hopeful for eventual CIR, but she has a ways to go before she will be ready. She will need to demonstrate at least 24 hrs of temp stability before switching from IV antibiotics and considering discharge.   Lenice Pressman, M.D., Ph.D. 09/14/2018, 1:09 PM

## 2018-09-14 NOTE — Progress Notes (Signed)
Occupational Therapy Treatment Patient Details Name: Maureen Stout MRN: [id number] DOB: [birth year] Today's Date: 09/14/2018    History of present illness 85 yo admitted with dizziness and weakness with several falls. Pt with bradycardia, aV block, urosepsis. PMhx: DM, Afib, HTN, bradycardia   OT comments  Pt is progressing well in cognition, strength and endurance. She was able to stand with RW with +2 assist, but not able to take steps. Used stedy at sink for standing grooming. Continues to be appropriate for CIR.  Follow Up Recommendations  CIR    Equipment Recommendations  3 in 1 bedside commode;Wheelchair (measurements OT);Wheelchair cushion (measurements OT)    Recommendations for Other Services      Precautions / Restrictions Precautions Precautions: Fall Restrictions Weight Bearing Restrictions: No       Mobility Bed Mobility                  Transfers Overall transfer level: Needs assistance Equipment used: Rolling walker (2 wheeled);Ambulation equipment used Transfers: Sit to/from Stand Sit to Stand: +2 physical assistance;Min assist Stand pivot transfers: Total assist       General transfer comment: performed x 4, from bed, stedy and chair    Balance Overall balance assessment: Needs assistance   Sitting balance-Leahy Scale: Fair       Standing balance-Leahy Scale: Poor Standing balance comment: can release stedy with one hand at sink with min assist for balance while engaged in grooming                           ADL either performed or assessed with clinical judgement   ADL Overall ADL's : Needs assistance/impaired Eating/Feeding: Set up;Sitting   Grooming: Oral care;Sitting;Standing;Minimal assistance Grooming Details (indicate cue type and reason): with stedy at sink                 Toilet Transfer: Total assistance;+2 for safety/equipment Toilet Transfer Details (indicate cue type and reason): simulated to chair with  stedy                 Vision       Perception     Praxis      Cognition Arousal/Alertness: Awake/alert Behavior During Therapy: Flat affect Overall Cognitive Status: Impaired/Different from baseline Area of Impairment: Orientation;Memory;Safety/judgement;Problem solving;Attention                 Orientation Level: Disoriented to;Time;Situation Current Attention Level: Sustained Memory: Decreased short-term memory Following Commands: Follows one step commands with increased time;Follows multi-step commands inconsistently Safety/Judgement: Decreased awareness of safety;Decreased awareness of deficits   Problem Solving: Slow processing;Requires verbal cues;Requires tactile cues          Exercises     Shoulder Instructions       General Comments      Pertinent Vitals/ Pain       Pain Assessment: Faces Faces Pain Scale: No hurt  Home Living                                          Prior Functioning/Environment              Frequency  Min 3X/week        Progress Toward Goals  OT Goals(current goals can now be found in the care plan section)  Progress towards OT goals: Progressing toward goals  Acute Rehab OT Goals Patient Stated Goal: return home OT Goal Formulation: With patient Time For Goal Achievement: 09/23/18 Potential to Achieve Goals: Good  Plan Discharge plan remains appropriate;Frequency remains appropriate    Co-evaluation    PT/OT/SLP Co-Evaluation/Treatment: Yes Reason for Co-Treatment: For patient/therapist safety   OT goals addressed during session: ADL's and self-care      AM-PAC OT "6 Clicks" Daily Activity     Outcome Measure   Help from another person eating meals?: A Little Help from another person taking care of personal grooming?: A Little Help from another person toileting, which includes using toliet, bedpan, or urinal?: A Lot Help from another person bathing (including washing,  rinsing, drying)?: A Lot Help from another person to put on and taking off regular upper body clothing?: A Lot Help from another person to put on and taking off regular lower body clothing?: Total 6 Click Score: 13    End of Session Equipment Utilized During Treatment: Rolling walker;Gait belt;Oxygen  OT Visit Diagnosis: Unsteadiness on feet (R26.81);Muscle weakness (generalized) (M62.81)   Activity Tolerance Patient tolerated treatment well   Patient Left in chair;with call bell/phone within reach;with chair alarm set   Nurse Communication Mobility status;Need for lift equipment        Time: 8889-1694 OT Time Calculation (min): 45 min  Charges: OT General Charges $OT Visit: 1 Visit OT Treatments $Self Care/Home Management : 8-22 mins  Nestor Lewandowsky, OTR/L Acute Rehabilitation Services Pager: [phone] Office: [phone]   Malka So 09/14/2018, 4:02 PM

## 2018-09-15 LAB — CBC WITH DIFFERENTIAL/PLATELET
Abs Immature Granulocytes: 0.2 10*3/uL — ABNORMAL HIGH (ref 0.00–0.07)
Basophils Absolute: 0 10*3/uL (ref 0.0–0.1)
Basophils Relative: 0 %
Eosinophils Absolute: 0 10*3/uL (ref 0.0–0.5)
Eosinophils Relative: 0 %
HCT: 25.3 % — ABNORMAL LOW (ref 36.0–46.0)
Hemoglobin: 8.2 g/dL — ABNORMAL LOW (ref 12.0–15.0)
Immature Granulocytes: 4 %
Lymphocytes Relative: 18 %
Lymphs Abs: 1 10*3/uL (ref 0.7–4.0)
MCH: 30.1 pg (ref 26.0–34.0)
MCHC: 32.4 g/dL (ref 30.0–36.0)
MCV: 93 fL (ref 80.0–100.0)
Monocytes Absolute: 0.4 10*3/uL (ref 0.1–1.0)
Monocytes Relative: 8 %
Neutro Abs: 4.1 10*3/uL (ref 1.7–7.7)
Neutrophils Relative %: 70 %
Platelets: 114 10*3/uL — ABNORMAL LOW (ref 150–400)
RBC: 2.72 MIL/uL — ABNORMAL LOW (ref 3.87–5.11)
RDW: 17.5 % — ABNORMAL HIGH (ref 11.5–15.5)
WBC: 5.8 10*3/uL (ref 4.0–10.5)
nRBC: 0 % (ref 0.0–0.2)

## 2018-09-15 LAB — COMPREHENSIVE METABOLIC PANEL
ALT: 73 U/L — ABNORMAL HIGH (ref 0–44)
AST: 66 U/L — ABNORMAL HIGH (ref 15–41)
Albumin: 2 g/dL — ABNORMAL LOW (ref 3.5–5.0)
Alkaline Phosphatase: 85 U/L (ref 38–126)
Anion gap: 8 (ref 5–15)
BUN: 40 mg/dL — ABNORMAL HIGH (ref 8–23)
CO2: 25 mmol/L (ref 22–32)
Calcium: 8.7 mg/dL — ABNORMAL LOW (ref 8.9–10.3)
Chloride: 107 mmol/L (ref 98–111)
Creatinine, Ser: 1.96 mg/dL — ABNORMAL HIGH (ref 0.44–1.00)
GFR calc Af Amer: 27 mL/min — ABNORMAL LOW (ref >60)
GFR calc non Af Amer: 23 mL/min — ABNORMAL LOW (ref >60)
Glucose, Bld: 103 mg/dL — ABNORMAL HIGH (ref 70–99)
Potassium: 3.6 mmol/L (ref 3.5–5.1)
Sodium: 140 mmol/L (ref 135–145)
Total Bilirubin: 0.5 mg/dL (ref 0.3–1.2)
Total Protein: 5 g/dL — ABNORMAL LOW (ref 6.5–8.1)

## 2018-09-15 LAB — CULTURE, BLOOD (ROUTINE X 2)
Culture: NO GROWTH
Culture: NO GROWTH
Special Requests: ADEQUATE

## 2018-09-15 LAB — GLUCOSE, CAPILLARY
Glucose-Capillary: 106 mg/dL — ABNORMAL HIGH (ref 70–99)
Glucose-Capillary: 99 mg/dL (ref 70–99)
Glucose-Capillary: 173 mg/dL — ABNORMAL HIGH (ref 70–99)
Glucose-Capillary: 178 mg/dL — ABNORMAL HIGH (ref 70–99)
Glucose-Capillary: 164 mg/dL — ABNORMAL HIGH (ref 70–99)

## 2018-09-15 MED ORDER — POLYETHYLENE GLYCOL 3350 17 G PO PACK
17.0000 g | PACK | Freq: Every day | ORAL | Status: DC
  Administered 2018-09-16 – 2018-09-19 (×3): 17 g via ORAL
  Filled 2018-09-15 (×4): qty 1

## 2018-09-15 NOTE — Progress Notes (Signed)
  Date: 09/15/2018  Patient name: Maureen Stout  Medical record number: [id number]  Date of birth: 1932-01-15   I have seen and evaluated this patient and I have discussed the plan of care with the house staff. Please see their note for complete details. I concur with their findings with the following additions/corrections:   Temperature stability has improved and she has remained off of the bear hugger, but temperature did drop somewhat this morning to 35.6.  We will continue IV antibiotics until she has shown temperature stability for more than 24 hours.  Her mental status has improved daily, but she is quite deconditioned and will need a lot of rehab before she is able to return to her baseline functional status.  We will try to remove her Foley and normalize her environment as much as possible to enable functional improvements.  Lenice Pressman, M.D., Ph.D. 09/15/2018, 3:36 PM

## 2018-09-15 NOTE — Progress Notes (Signed)
Foley cath removed. Patient tolerated well. Purewick placed.

## 2018-09-15 NOTE — Progress Notes (Signed)
Physical Therapy Treatment Patient Details Name: Maureen Stout MRN: [id number] DOB: 07/06/1931 Today's Date: 09/15/2018    History of Present Illness 85 yo admitted with dizziness and weakness with several falls. Pt with bradycardia, aV block, urosepsis. PMhx: DM, Afib, HTN, bradycardia    PT Comments    Pt agreeable to therapy, and is oriented to situation today, still not sure of time. Pt continues to require increased time and cues for sequencing with mobility. Focus of session to improve LE strength to begin safe ambulation. Pt is min A for bed mobility and minAx2 for powering up from [place] and recliner. Worked on marching in place and pt requires modA due to posterior lean and decreased stability with shifting weight for stepping. D/c plans remain appropriate to regain PLOF. PT will continue to follow acutely.    Follow Up Recommendations  CIR;Supervision/Assistance - 24 hour     Equipment Recommendations  3in1 (PT)    Recommendations for Other Services Rehab consult     Precautions / Restrictions Precautions Precautions: Fall Restrictions Weight Bearing Restrictions: No    Mobility  Bed Mobility Overal bed mobility: Needs Assistance       Supine to sit: Min assist     General bed mobility comments: min A for pulling up against therapist to come to EoB  Transfers Overall transfer level: Needs assistance Equipment used: Rolling walker (2 wheeled);Ambulation equipment used Transfers: Sit to/from Stand Sit to Stand: +2 physical assistance;Min guard         General transfer comment: min guard for power up to [facility]  Ambulation/Gait             General Gait Details: unable to attempt due to decreased LE strength       Balance Overall balance assessment: Needs assistance   Sitting balance-Leahy Scale: Fair       Standing balance-Leahy Scale: Poor Standing balance comment: can release stedy with one hand at sink with min assist for balance while  engaged in grooming                            Cognition Arousal/Alertness: Awake/alert Behavior During Therapy: Flat affect Overall Cognitive Status: Impaired/Different from baseline Area of Impairment: Orientation;Memory;Safety/judgement;Problem solving;Attention                 Orientation Level: Disoriented to;Time Current Attention Level: Sustained Memory: Decreased short-term memory Following Commands: Follows one step commands with increased time;Follows multi-step commands inconsistently Safety/Judgement: Decreased awareness of safety;Decreased awareness of deficits   Problem Solving: Slow processing;Requires verbal cues;Requires tactile cues General Comments: pt processing is improving but still requires additional time and cuing for task at hand      Exercises General Exercises - Lower Extremity Ankle Circles/Pumps: AROM;Both;10 reps Short Arc Quad: AROM;Both;10 reps Straight Leg Raises: AROM;Both;5 reps;Supine Hip Flexion/Marching: AROM;10 reps;Standing    General Comments General comments (skin integrity, edema, etc.): vss      Pertinent Vitals/Pain Pain Assessment: No/denies pain Faces Pain Scale: No hurt           PT Goals (current goals can now be found in the care plan section) Acute Rehab PT Goals Patient Stated Goal: return home PT Goal Formulation: With patient Time For Goal Achievement: 09/23/18 Potential to Achieve Goals: Fair    Frequency    Min 3X/week      PT Plan Current plan remains appropriate       AM-PAC PT "6 Clicks" Mobility  Outcome Measure  Help needed turning from your back to your side while in a flat bed without using bedrails?: A Little Help needed moving from lying on your back to sitting on the side of a flat bed without using bedrails?: A Lot Help needed moving to and from a bed to a chair (including a wheelchair)?: A Lot Help needed standing up from a chair using your arms (e.g., wheelchair or  bedside chair)?: A Lot Help needed to walk in hospital room?: Total Help needed climbing 3-5 steps with a railing? : Total 6 Click Score: 11    End of Session Equipment Utilized During Treatment: Gait belt Activity Tolerance: Patient tolerated treatment well Patient left: in chair;with call bell/phone within reach;with chair alarm set;Other (comment) Nurse Communication: Mobility status;Precautions PT Visit Diagnosis: Other abnormalities of gait and mobility (R26.89);Muscle weakness (generalized) (M62.81);Difficulty in walking, not elsewhere classified (R26.2)     Time: 1194-1740 PT Time Calculation (min) (ACUTE ONLY): 25 min  Charges:  $Gait Training: 8-22 mins $Therapeutic Exercise: 8-22 mins                     Porsche Noguchi B. Migdalia Dk PT, DPT Acute Rehabilitation Services Pager [phone] Office [phone]    [place] 09/15/2018, 2:39 PM

## 2018-09-15 NOTE — Progress Notes (Signed)
Subjective: Patient was seen and evaluated at bedside on morning rounds. She is doing well and communicates better today. Talking with his son on  the phone with his when we were in the room. She mentions that she is waiting for her breakfast. Has no complaint. Maureen Stout endorses that she is in hospital but does not know where. She likes to go home but knows she should stay because she would like to be better. Reported to have some hypothermia this AM. Is not in bear hugger currently though.   Objective:  Vital signs in last 24 hours: Vitals:   09/14/18 1625 09/14/18 1940 09/14/18 2332 09/15/18 0414  BP: (!) 138/114 138/64 140/72 (!) 144/58  Pulse: 65 64 60 (!) 59  Resp: 11     Temp: 98.4 F (36.9 C) 97.8 F (36.6 C) 97.6 F (36.4 C) (!) 97.5 F (36.4 C)  TempSrc: Bladder Oral Oral Oral  SpO2: 96% 96% 98% 96%  Weight:      Height:       Physical exam: Vital signs reviewed, nursing note reviewed General: Patientis sitting in the bed and appears comfrotable . No acute distress. Appears comfortable CV: RRR, normal S1-S2,JVP is elevated but improved comparing to yesterday. Pulmonary exam: CTA bilaterally, no wheezing, no crackle Extremities: Pulses are present, has bilateral 2+ nonpitting lower extremity edema, extremities are warm Neurologic exam: Alert and oriented x2  Assessment/Plan:  Principal Problem:   Sepsis due to Enterobacter species [facility]) Active Problems:   Paroxysmal atrial fibrillation (HCC)   Sinus bradycardia   Hypothyroidism   First degree AV block   Acute on chronic anemia   Acute pancreatitis without infection or necrosis   Bradycardia   Bacteremia   Hypoglycemia without diagnosis of diabetes mellitus   85 year old female with past medical history of paroxysmal A. fib, hypothyroidism, enterovesicular fistula and chronic UTI scented with altered mental status and found to have urosepsis.  Sepsis secondary to Enterobacter bacteremia secondary to UTI:  Patient with Hx or entero-vesicular fistula and chronic UTI, presented with weakness and sepsis.   She has been on IVcefepime.She showed some clinical improvement today.  Gradually improving. Mental status is progressing and she is alert and oriented to place and people now. Temp 35.6. Her extremities are warm without bear hugger.  Will recheck in 2 other sites to make sure this is accurate. BC remained negative. May remove foley cath and insert pure wick. Improving. If Temp remains stable for 24 h w/o bear hugger, may switch to PO antibiotic and consider discharge to [facility]. We will continue IV antibiotic.  I talked to her son and updated him.  - ContinueIVCefepime (stop date: 09/21/18) - Continue PT/OT. - Follow-up CIR recommendations - DC Foley - CMP daily - CBC daily -Check temp from 2 other sources  Normocytic anemia and thrombocytopenia:  Likely due to acute illness and sepsis. Post transfusion Hb remains stable for 2 days.Hb 8.2 today No evidence of bleeding.   Was low risk for HIT, with T score of 3 and we continue sub q Hep.   -CBC daily  AKI:  Improving Cr 1.96 today and GFR 23 -BMP daily  Acute pancreatitis: Asymptomatic. Abdominal exam in unremarkable. RepeatCT abdomenwith nosignificant change in mild acute pancreatitis and without complication. No further management.   Bradycardia/atrial fibrillation:  Resolved. HR ranging at 60s. Holding Metoprolol. Has currently sinus rhythm.  - Continuehome amiodarone 100 mg daily. -Keep holdingMetoprolol at discharge. - Continue cardiac monitoring  Hypothyroidism:  - Continue  levothyroxine 75 mcg daily  Dispo: Anticipated discharge in approximately 2-3 days  Maureen Hatch, MD 09/15/2018, 6:17 AM Pager: [phone]

## 2018-09-16 DIAGNOSIS — I959 Hypotension, unspecified: Secondary | ICD-10-CM

## 2018-09-16 DIAGNOSIS — R601 Generalized edema: Secondary | ICD-10-CM

## 2018-09-16 LAB — CBC WITH DIFFERENTIAL/PLATELET
Abs Immature Granulocytes: 0.15 10*3/uL — ABNORMAL HIGH (ref 0.00–0.07)
Basophils Absolute: 0 10*3/uL (ref 0.0–0.1)
Basophils Relative: 0 %
Eosinophils Absolute: 0 10*3/uL (ref 0.0–0.5)
Eosinophils Relative: 0 %
HCT: 24.5 % — ABNORMAL LOW (ref 36.0–46.0)
Hemoglobin: 7.9 g/dL — ABNORMAL LOW (ref 12.0–15.0)
Immature Granulocytes: 2 %
Lymphocytes Relative: 15 %
Lymphs Abs: 1 10*3/uL (ref 0.7–4.0)
MCH: 30.4 pg (ref 26.0–34.0)
MCHC: 32.2 g/dL (ref 30.0–36.0)
MCV: 94.2 fL (ref 80.0–100.0)
Monocytes Absolute: 0.4 10*3/uL (ref 0.1–1.0)
Monocytes Relative: 6 %
Neutro Abs: 5.1 10*3/uL (ref 1.7–7.7)
Neutrophils Relative %: 77 %
Platelets: 135 10*3/uL — ABNORMAL LOW (ref 150–400)
RBC: 2.6 MIL/uL — ABNORMAL LOW (ref 3.87–5.11)
RDW: 17.2 % — ABNORMAL HIGH (ref 11.5–15.5)
WBC: 6.7 10*3/uL (ref 4.0–10.5)
nRBC: 0 % (ref 0.0–0.2)

## 2018-09-16 LAB — COMPREHENSIVE METABOLIC PANEL
ALT: 71 U/L — ABNORMAL HIGH (ref 0–44)
AST: 61 U/L — ABNORMAL HIGH (ref 15–41)
Albumin: 2 g/dL — ABNORMAL LOW (ref 3.5–5.0)
Alkaline Phosphatase: 91 U/L (ref 38–126)
Anion gap: 9 (ref 5–15)
BUN: 39 mg/dL — ABNORMAL HIGH (ref 8–23)
CO2: 24 mmol/L (ref 22–32)
Calcium: 8.6 mg/dL — ABNORMAL LOW (ref 8.9–10.3)
Chloride: 107 mmol/L (ref 98–111)
Creatinine, Ser: 1.7 mg/dL — ABNORMAL HIGH (ref 0.44–1.00)
GFR calc Af Amer: 32 mL/min — ABNORMAL LOW (ref >60)
GFR calc non Af Amer: 27 mL/min — ABNORMAL LOW (ref >60)
Glucose, Bld: 116 mg/dL — ABNORMAL HIGH (ref 70–99)
Potassium: 3.8 mmol/L (ref 3.5–5.1)
Sodium: 140 mmol/L (ref 135–145)
Total Bilirubin: 0.5 mg/dL (ref 0.3–1.2)
Total Protein: 5.1 g/dL — ABNORMAL LOW (ref 6.5–8.1)

## 2018-09-16 LAB — GLUCOSE, CAPILLARY
Glucose-Capillary: 116 mg/dL — ABNORMAL HIGH (ref 70–99)
Glucose-Capillary: 117 mg/dL — ABNORMAL HIGH (ref 70–99)
Glucose-Capillary: 121 mg/dL — ABNORMAL HIGH (ref 70–99)
Glucose-Capillary: 140 mg/dL — ABNORMAL HIGH (ref 70–99)
Glucose-Capillary: 142 mg/dL — ABNORMAL HIGH (ref 70–99)

## 2018-09-16 MED ORDER — SULFAMETHOXAZOLE-TRIMETHOPRIM 800-160 MG PO TABS
1.0000 | ORAL_TABLET | Freq: Two times a day (BID) | ORAL | Status: DC
  Filled 2018-09-16: qty 1

## 2018-09-16 MED ORDER — SODIUM CHLORIDE 0.9 % IV SOLN
2.0000 g | INTRAVENOUS | Status: DC
  Administered 2018-09-16 – 2018-09-17 (×2): 2 g via INTRAVENOUS
  Filled 2018-09-16 (×3): qty 2

## 2018-09-16 MED ORDER — APIXABAN 2.5 MG PO TABS
2.5000 mg | ORAL_TABLET | Freq: Two times a day (BID) | ORAL | Status: DC
  Administered 2018-09-16 – 2018-09-19 (×7): 2.5 mg via ORAL
  Filled 2018-09-16 (×7): qty 1

## 2018-09-16 NOTE — Progress Notes (Signed)
No urine output, bladder scanned 485ml. Pt denies discomfort and states not having the urge to void.Paged IM, got orders for a straight cath. Will continue to monitor patient.

## 2018-09-16 NOTE — Progress Notes (Signed)
Inpatient Rehabilitation Admissions Coordinator  I met with patient at bedside to assess for her rehab potential. I assisted pt in self feeding of her lunch and set up. I will follow up on Monday to assess her tolerance for more intensive rehab at CIR vs SNF. Please call me with any questions.  Danne Baxter, RN, MSN Rehab Admissions Coordinator [phone] 09/16/2018 12:49 PM

## 2018-09-16 NOTE — Progress Notes (Signed)
Subjective: Patient was seen and evaluated at bedside on morning rounds. No acute events overnight. She mentions that she does not know if she has pain. He endorses continued. No other acute complaints.  Objective:  Vital signs in last 24 hours: Vitals:   09/15/18 1700 09/15/18 1800 09/15/18 2041 09/16/18 0411  BP:   137/70 138/66  Pulse: 65 70 64 65  Resp: 15 12 13 16   Temp: (!) 97.2 F (36.2 C) (!) 97 F (36.1 C) 97.6 F (36.4 C) 97.8 F (36.6 C)  TempSrc: Bladder Bladder Oral Oral  SpO2: 96% 94% 94% 93%  Weight:    90.1 kg  Height:       Physical Exam:  VS reviewed, nursing notes reviewed. General: Developed well-nourished, lying in the bed in no acute distress CV: RRR, normal S1-S2 Pulm: No respiratory distress, CTA bilaterally Abdomen: 3-4 cm Bruise at right lower quadrant and about 10x15 cm2 bruise at left lower quadrant around injection site is visible, abdomen is soft and nontender to palpation.  No CVA tenderness Musculoskeletal: 1-2+ mixed pitting, nonpitting edema in bilateral extremities up to mid tibia. Neurologic exam: Alert and oriented x2  Assessment/Plan:  Principal Problem:   Sepsis due to Enterobacter species [facility]) Active Problems:   Paroxysmal atrial fibrillation (HCC)   Sinus bradycardia   Hypothyroidism   First degree AV block   Acute on chronic anemia   Acute pancreatitis without infection or necrosis   Bradycardia   Bacteremia   Hypoglycemia without diagnosis of diabetes mellitus  Abdominal bruise. Held Lovenox. SCDs Bladder scan 480 ml. In & O cath.   85 year old female with past medical history of paroxysmal A. fib, hypothyroidism, enterovesicular fistula and chronic UTI scented with altered mental status and found to have urosepsis.  Sepsis secondary to Enterobacter bacteremia secondary to UTI: Patient with Hx or entero-vesicular fistula and chronic UTI, presented with weakness and sepsis.   Received IVcefepime x 9 days. Mental  status improved and she did not have any hypothermia since yesterday.  No fever, no growth on repeated blood culture.  Considered to switch to oral antibiotic today, how ever due to AB resistance having limited options (Ciprofloxacin is the main option that we hesitate to prescribe considering old age and AKI), will continue with IV Cefepime for now that she is in hospital and while awaiting for CIR placement.    when patient is in hospital and wi;ll CIR tomorrow if remain stable, has more ambulation and pee.  She had urinary retention with post voidal volume of 470 after removing foley cath. Has used I & O cath. Will continue that as needed. We will try to avoid placing foley cath since she has entero vesicular fistula and high risk of UTI.   - Continue IV Cefepim (stop date: 09/21/18) - Continue PT/OT. - Follow-up insurance approval for CIR admission. Appreciate inpatient rehab admission team follow up and recommendations. - Will try to avoid reinserting Foley cath and use in and out cath as needed instead - CMP daily - CBC daily   Normocytic anemia and thrombocytopenia:  Was likely due to acute illness and sepsis. Post transfusion Hb remains stable for 2 days. Hb 7.9 today   LLQ Bruise and mild bruise at RLQ: Seems to be at site of Heparin injection. No abdominal pain or tenderness. Less likely to be retroperitoneal hemmorhage. BP stable. No abdominal tenderness and Hb stable. Seems to be due to sub q injection at the area.  -CBC daily -Holding sub  Q Heparrin and starting home dose of Eliquis  -Will monitor for any evidence of bleeding   AKI:  Improving Cr 1.7 today (was 3 on arrival) -BMP daily  Acute pancreatitis: Asymptomatic. Abdominal exam in unremarkable. RepeatCT abdomenwith nosignificant change in mild acute pancreatitisand without complication. No further management.   Bradycardia/atrial fibrillation: Resolved.  Bradycardia on telemetry. Has currently sinus  rhythm.  Continue holding metoprolol. Resuming Eliquis 2.5 mg QD  - Continuehome amiodarone 100 mg daily. -Keep holdingMetoprolol at discharge. - Continue cardiac monitoring - Eliquis 2.5 mg QD  Hypothyroidism:  - Continue levothyroxine 75 mcg daily   Dispo: Anticipated discharge in approximately 1-2 day(s).   Dewayne Hatch, MD 09/16/2018, 5:41 AM Pager: [phone]

## 2018-09-16 NOTE — TOC Progression Note (Signed)
Transition of Care [facility]) - Progression Note    Patient Details  Name: Maureen Stout MRN: [id number] Date of Birth: 1932/04/04  Transition of Care [facility]) CM/SW Contact  Graves-Bigelow, Ocie Cornfield, RN Phone Number: 09/16/2018, 12:56 PM  Clinical Narrative:   CIR will continue to monitor the patient over the weekend to see if she is stable to transition to CIR on Monday. CM will continue to monitor for additional transition of care needs.     Expected Discharge Plan: [place] Barriers to Discharge: Continued Medical Work up  Expected Discharge Plan and Services Expected Discharge Plan: [place]   Discharge Planning Services: CM Consult   Living arrangements for the past 2 months: Single Family Home(has ramp to get into the back of home)                  Social Determinants of Health (SDOH) Interventions    Readmission Risk Interventions No flowsheet data found.

## 2018-09-16 NOTE — Progress Notes (Signed)
Physical Therapy Treatment Patient Details Name: Maureen Stout MRN: [id number] DOB: 23-Nov-1931 Today's Date: 09/16/2018    History of Present Illness 85 yo admitted with dizziness and weakness with several falls. Pt with bradycardia, aV block, urosepsis. PMhx: DM, Afib, HTN, bradycardia    PT Comments    Pt performed gait training and functional mobility during session this pm.  Co treatment performed for progression of gait training.  Pt able to progress to gt with decrease cadence and min assistance +2.  Pt fatigues quickly and during 2nd trial of gait her knees became weak.  Plan remains appropriate for aggressive CIR therapies.  Will plan for progression of gait training next session.      Follow Up Recommendations  CIR;Supervision/Assistance - 24 hour     Equipment Recommendations  3in1 (PT)    Recommendations for Other Services       Precautions / Restrictions Precautions Precautions: Fall Precaution Comments: picks at skin and lines, multiple UE lacerations Restrictions Weight Bearing Restrictions: No    Mobility  Bed Mobility               General bed mobility comments: Pt seated on BSC on arrival.    Transfers Overall transfer level: Needs assistance Equipment used: Rolling walker (2 wheeled) Transfers: Sit to/from Stand Sit to Stand: Min assist;+2 physical assistance         General transfer comment: Cues for hand placement to and from seated surface.    Ambulation/Gait Ambulation/Gait assistance: Min assist;+2 physical assistance Gait Distance (Feet): 30 Feet(2 nd trial of 6 ft but reports feeling weak in the knees.  ) Assistive device: Rolling walker (2 wheeled) Gait Pattern/deviations: Step-through pattern;Trunk flexed;Shuffle;Decreased stride length     General Gait Details: Cues for upper trunk control increasing B strides and maintaining close proximity to RW.  Close chair follow required due to fatigue.  Cues to keep straight path of RW.      Stairs             Wheelchair Mobility    Modified Rankin (Stroke Patients Only)       Balance Overall balance assessment: Needs assistance   Sitting balance-Leahy Scale: Fair       Standing balance-Leahy Scale: Poor Standing balance comment: Pt with B UE support during gait and unilateral support while performing ADLs.                              Cognition Arousal/Alertness: Awake/alert Behavior During Therapy: WFL for tasks assessed/performed Overall Cognitive Status: Within Functional Limits for tasks assessed(not formally assessed.)                                 General Comments: Pt able to notify staff when needing to have BM.  Pt recalling working for a [employer].        Exercises      General Comments        Pertinent Vitals/Pain Pain Assessment: No/denies pain    Home Living                      Prior Function            PT Goals (current goals can now be found in the care plan section) Acute Rehab PT Goals Patient Stated Goal: return home Potential to Achieve Goals: Fair Progress towards  PT goals: Progressing toward goals    Frequency    Min 3X/week      PT Plan Current plan remains appropriate    Co-evaluation PT/OT/SLP Co-Evaluation/Treatment: Yes Reason for Co-Treatment: Complexity of the patient's impairments (multi-system involvement);For patient/therapist safety PT goals addressed during session: Mobility/safety with mobility OT goals addressed during session: ADL's and self-care      AM-PAC PT "6 Clicks" Mobility   Outcome Measure  Help needed turning from your back to your side while in a flat bed without using bedrails?: A Little Help needed moving from lying on your back to sitting on the side of a flat bed without using bedrails?: A Lot Help needed moving to and from a bed to a chair (including a wheelchair)?: A Lot Help needed standing up from a chair using your  arms (e.g., wheelchair or bedside chair)?: A Lot Help needed to walk in hospital room?: A Lot Help needed climbing 3-5 steps with a railing? : Total 6 Click Score: 12    End of Session Equipment Utilized During Treatment: Gait belt Activity Tolerance: Patient tolerated treatment well Patient left: in chair;with call bell/phone within reach;with chair alarm set;Other (comment) Nurse Communication: Mobility status;Precautions PT Visit Diagnosis: Other abnormalities of gait and mobility (R26.89);Muscle weakness (generalized) (M62.81);Difficulty in walking, not elsewhere classified (R26.2)     Time: 7416-3845 PT Time Calculation (min) (ACUTE ONLY): 28 min  Charges:  $Gait Training: 8-22 mins                     Governor Rooks, PTA Acute Rehabilitation Services Pager [phone] Office [place] 09/16/2018, 2:08 PM

## 2018-09-16 NOTE — Progress Notes (Signed)
  Date: 09/16/2018  Patient name: Maureen Stout  Medical record number: [id number]  Date of birth: 1932-03-05   I have seen and evaluated this patient and I have discussed the plan of care with the house staff. Please see their note for complete details. I concur with their findings with the following additions/corrections:   85 year old woman with a history of DM and paroxysmal A. fib admitted for Enterobacter bacteremia and symptomatic bradycardia.  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    1.  Enterobacter UTI and bacteremia from enterovesicular fistula: She has had ongoing difficulty with temperature stability with multiple episodes of hypothermia requiring a Retail banker.  Temperature got as low as 96.3 yesterday morning, but improved on its own and has remained above 97 since yesterday afternoon without a Retail banker.  She has now completed 9 days of IV cefepime, and I would like to transition to oral antibiotics to complete a 14-day course, but our oral options are limited.  It is sensitive to ceftriaxone, but per pharmacy, Enterobacter has inducible resistance to third-generation cephalosporins, so cefdinir is not ideal.  Ciprofloxacin would be the other option, and would rather avoid it given its significant side effects.  For now, we will continue IV cefepime until we have a clear plan for discharge.  She was quite encephalopathic for the first few days of admission, and while this has improved, she remains quite weak and will need rehab.  CIR is considering admission when she is medically ready.  2.  Symptomatic bradycardia with hypotension: Resolved with holding metoprolol.  She does have a history of paroxysmal atrial fibrillation, and we resumed amiodarone early on with good effect.  Anticoagulation was held because her hemoglobin had dropped to the point of requiring transfusion, but Hgb has now stabilized and we can resume  apixaban in place of DVT prophylaxis.  3.  Acute on chronic anemia with thrombocytopenia: Unclear etiology, but most likely related to sepsis.  She required transfusion of 1 unit PRBCs, but Hgb has been stable since then.  4.  Acute pancreatitis: Noted on CT abdomen, but she has never reported more than minimal symptoms.  We repeated the CT of her abdomen on 4/27 when she continued to have hypothermia and her Hgb had dropped, but there was no evidence of complication of her UTI or pancreatitis.  No further treatment needed at this time.  5.  Anasarca: Likely related to severe hypoalbuminemia from sepsis and IV fluid resuscitation.  We have been hesitant to diurese her aggressively given the acute pancreatitis, and she has equilibrated slowly over time.  We could consider initiating diuresis in the near future.  Lenice Pressman, M.D., Ph.D. 09/16/2018, 2:34 PM

## 2018-09-16 NOTE — Progress Notes (Signed)
Occupational Therapy Treatment Patient Details Name: Maureen Stout MRN: [id number] DOB: [birth year] Today's Date: 09/16/2018    History of present illness 85 yo admitted with dizziness and weakness with several falls. Pt with bradycardia, aV block, urosepsis. PMhx: DM, Afib, HTN, bradycardia   OT comments  Pt continues to progress. Able to ambulate in hall with RW with min assist and second person for safety/chair follow. Seated rest break needed prior to pt standing for one grooming activity. Pt with stable VS throughout session on RA. Pt will benefit from intensive rehab.   Follow Up Recommendations  CIR    Equipment Recommendations  Other (comment)(defer to next venue)    Recommendations for Other Services      Precautions / Restrictions Precautions Precautions: Fall Precaution Comments: picks at skin and lines, multiple UE lacerations Restrictions Weight Bearing Restrictions: No       Mobility Bed Mobility               General bed mobility comments: Pt seated on BSC on arrival.    Transfers Overall transfer level: Needs assistance Equipment used: Rolling walker (2 wheeled) Transfers: Sit to/from Stand Sit to Stand: Min assist;+2 physical assistance         General transfer comment: Cues for hand placement to and from seated surface.      Balance Overall balance assessment: Needs assistance   Sitting balance-Leahy Scale: Fair     Standing balance support: Bilateral upper extremity supported Standing balance-Leahy Scale: Poor Standing balance comment: Pt with B UE support during gait and unilateral support while performing ADLs.                             ADL either performed or assessed with clinical judgement   ADL Overall ADL's : Needs assistance/impaired     Grooming: Oral care;Standing;Min guard Grooming Details (indicate cue type and reason): pt set up her toothbrush in sitting and stood to brush teeth         Upper Body Dressing  : Minimal assistance;Sitting Upper Body Dressing Details (indicate cue type and reason): front opening gown     Toilet Transfer: Moderate assistance;RW   Toileting- Clothing Manipulation and Hygiene: Total assistance;Sit to/from stand       Functional mobility during ADLs: Minimal assistance;Rolling walker;+2 for safety/equipment       Vision       Perception     Praxis      Cognition Arousal/Alertness: Awake/alert Behavior During Therapy: WFL for tasks assessed/performed Overall Cognitive Status: Impaired/Different from baseline Area of Impairment: Problem solving                             Problem Solving: Slow processing General Comments: Following commands with improved processing speed.        Exercises     Shoulder Instructions       General Comments      Pertinent Vitals/ Pain       Pain Assessment: No/denies pain  Home Living Family/patient expects to be discharged to:: Private residence Living Arrangements: Alone Available Help at Discharge: Family;Available PRN/intermittently Type of Home: House Home Access: Ramped entrance     Home Layout: One level     Bathroom Shower/Tub: Occupational psychologist: Handicapped height     Home Equipment: Environmental consultant - 2 wheels          Prior  Functioning/Environment Level of Independence: Independent with assistive device(s)            Frequency  Min 2X/week        Progress Toward Goals  OT Goals(current goals can now be found in the care plan section)  Progress towards OT goals: Progressing toward goals  Acute Rehab OT Goals Patient Stated Goal: return home OT Goal Formulation: With patient Time For Goal Achievement: 09/23/18 Potential to Achieve Goals: Good  Plan Discharge plan remains appropriate;Frequency remains appropriate    Co-evaluation    PT/OT/SLP Co-Evaluation/Treatment: Yes Reason for Co-Treatment: For patient/therapist safety PT goals addressed  during session: Mobility/safety with mobility OT goals addressed during session: ADL's and self-care      AM-PAC OT "6 Clicks" Daily Activity     Outcome Measure   Help from another person eating meals?: A Little Help from another person taking care of personal grooming?: A Little Help from another person toileting, which includes using toliet, bedpan, or urinal?: A Lot Help from another person bathing (including washing, rinsing, drying)?: A Lot Help from another person to put on and taking off regular upper body clothing?: A Little Help from another person to put on and taking off regular lower body clothing?: Total 6 Click Score: 14    End of Session Equipment Utilized During Treatment: Gait belt;Rolling walker  OT Visit Diagnosis: Unsteadiness on feet (R26.81);Muscle weakness (generalized) (M62.81)   Activity Tolerance Patient tolerated treatment well   Patient Left in chair;with call bell/phone within reach;with chair alarm set   Nurse Communication          Time: 3419-3790 OT Time Calculation (min): 28 min  Charges: OT General Charges $OT Visit: 1 Visit OT Treatments $Self Care/Home Management : 8-22 mins  Maureen Stout, OTR/L Acute Rehabilitation Services Pager: [phone] Office: [phone] Maureen Stout 09/16/2018, 2:53 PM

## 2018-09-16 NOTE — Progress Notes (Signed)
Patient voided 174ml, bladder scanned showed 570 post void. Will cath.

## 2018-09-16 NOTE — Progress Notes (Signed)
No urine output post foley catheter. Bladder scanned. 226ml. No discomfort. Will continue to monitor patient.

## 2018-09-17 LAB — CBC WITH DIFFERENTIAL/PLATELET
Abs Immature Granulocytes: 0.11 10*3/uL — ABNORMAL HIGH (ref 0.00–0.07)
Basophils Absolute: 0 10*3/uL (ref 0.0–0.1)
Basophils Relative: 0 %
Eosinophils Absolute: 0 10*3/uL (ref 0.0–0.5)
Eosinophils Relative: 0 %
HCT: 26.3 % — ABNORMAL LOW (ref 36.0–46.0)
Hemoglobin: 8.5 g/dL — ABNORMAL LOW (ref 12.0–15.0)
Immature Granulocytes: 2 %
Lymphocytes Relative: 16 %
Lymphs Abs: 1 10*3/uL (ref 0.7–4.0)
MCH: 30 pg (ref 26.0–34.0)
MCHC: 32.3 g/dL (ref 30.0–36.0)
MCV: 92.9 fL (ref 80.0–100.0)
Monocytes Absolute: 0.4 10*3/uL (ref 0.1–1.0)
Monocytes Relative: 6 %
Neutro Abs: 4.7 10*3/uL (ref 1.7–7.7)
Neutrophils Relative %: 76 %
Platelets: 160 10*3/uL (ref 150–400)
RBC: 2.83 MIL/uL — ABNORMAL LOW (ref 3.87–5.11)
RDW: 17.2 % — ABNORMAL HIGH (ref 11.5–15.5)
WBC: 6.2 10*3/uL (ref 4.0–10.5)
nRBC: 0 % (ref 0.0–0.2)

## 2018-09-17 LAB — COMPREHENSIVE METABOLIC PANEL
ALT: 72 U/L — ABNORMAL HIGH (ref 0–44)
AST: 55 U/L — ABNORMAL HIGH (ref 15–41)
Albumin: 2.1 g/dL — ABNORMAL LOW (ref 3.5–5.0)
Alkaline Phosphatase: 95 U/L (ref 38–126)
Anion gap: 6 (ref 5–15)
BUN: 34 mg/dL — ABNORMAL HIGH (ref 8–23)
CO2: 26 mmol/L (ref 22–32)
Calcium: 8.8 mg/dL — ABNORMAL LOW (ref 8.9–10.3)
Chloride: 106 mmol/L (ref 98–111)
Creatinine, Ser: 1.58 mg/dL — ABNORMAL HIGH (ref 0.44–1.00)
GFR calc Af Amer: 34 mL/min — ABNORMAL LOW (ref >60)
GFR calc non Af Amer: 30 mL/min — ABNORMAL LOW (ref >60)
Glucose, Bld: 116 mg/dL — ABNORMAL HIGH (ref 70–99)
Potassium: 3.7 mmol/L (ref 3.5–5.1)
Sodium: 138 mmol/L (ref 135–145)
Total Bilirubin: 0.5 mg/dL (ref 0.3–1.2)
Total Protein: 5.3 g/dL — ABNORMAL LOW (ref 6.5–8.1)

## 2018-09-17 LAB — GLUCOSE, CAPILLARY
Glucose-Capillary: 103 mg/dL — ABNORMAL HIGH (ref 70–99)
Glucose-Capillary: 98 mg/dL (ref 70–99)
Glucose-Capillary: 87 mg/dL (ref 70–99)
Glucose-Capillary: 116 mg/dL — ABNORMAL HIGH (ref 70–99)
Glucose-Capillary: 187 mg/dL — ABNORMAL HIGH (ref 70–99)

## 2018-09-17 MED ORDER — RAMELTEON 8 MG PO TABS
8.0000 mg | ORAL_TABLET | Freq: Every day | ORAL | Status: DC
  Administered 2018-09-17 – 2018-09-18 (×2): 8 mg via ORAL
  Filled 2018-09-17 (×3): qty 1

## 2018-09-17 NOTE — Progress Notes (Signed)
° °  Subjective: [name] was seen and evaluated this morning. She appears comfortable, she is more verbal now, is aware that she is in [facility]. Has no complaints.   Objective:  Vital signs in last 24 hours: Vitals:   09/16/18 1143 09/16/18 1957 09/17/18 0557 09/17/18 0735  BP: (!) 141/61 (!) 138/57 (!) 152/70 (!) 147/90  Pulse: 67 64 66 65  Resp: 14 17 13 13   Temp: 98.3 F (36.8 C) 97.6 F (36.4 C) (!) 97.5 F (36.4 C) (!) 97.5 F (36.4 C)  TempSrc: Oral Oral Oral Oral  SpO2: 96% 98% 94% 96%  Weight:   87.3 kg   Height:       Physical Exam:  VS reviewed, nursing notes reviewed. General: Pleasant lady, lying in the bed in no acute distress CV: RRR, normal S1-S2 Pulm: No respiratory distress, CTA bilaterally Abdomen: Abdomen is soft and nontender to palpation. BS are present. Musculoskeletal: Same 1-2+  mixed pitting, nonpitting edema in bilateral extremities up to mid tibia. Neurologic exam: Alert and oriented x2   Assessment/Plan:  Principal Problem:   Sepsis due to Enterobacter species [facility]) Active Problems:   Paroxysmal atrial fibrillation (HCC)   Sinus bradycardia   Hypothyroidism   First degree AV block   Acute on chronic anemia   Acute pancreatitis without infection or necrosis   Bradycardia   Bacteremia   Hypoglycemia without diagnosis of diabetes mellitus   85 year old female with past medical history of paroxysmal A. fib, hypothyroidism,enterovesicular fistula and chronic UTI scented with altered mental status and found to have urosepsis.  Metabolic encephalopathy 2/2 Urosepsis with Enterobacter bacteremia in setting of 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 Hypothermia improved without bair hugger. Lowest temp was 36.4 'C. Continuing IV antibionts (IV Cefepime day 10 of 14). Will probably continue antibiotic in IV form while waiting for Rehab placement, since we do not have a limited PO antibiotic options per susceptibility  result and multiple resistance. Ciprofloxacin was an option but not prefered due to side effect and old age. Per pharmacy, third generation Cephalosporin such as Cefdinir wont be the best choice for enterobacter due to inducible resistance.)  - Continue IV Cefepim (started on 4/22. stop date: 09/21/18) - Continue PT/OT. - Follow-up CIR admission vs SNF plcement. (Per CIR admission staff, patient will be reevaluated on [place] to see if she is stonge enough to tolerates few hours of rehab. If not, may plan to DC to SNF) - Continue Pure wick until she is able to ambulates. (Avoid reinserting Foley cath due to Hx of enterovesicular fistula. Use in and out cath if needed instead) - CMP daily - CBC daily  Acute on chronic normocytic anemia and thrombocytopenia: Was likely due to acute illness and sepsis. S/p  Blood transfusion. Hb has remained stable. Held sub Q Heparine. -Cad continue home dose of Eliqis -CBC daily  AKI:  Improving and trending down.  Cr1.5 today (was 3 on arrival) -BMPdaily  Acute pancreatitis:  Stable and asymptomatic. Abdominal exam in unremarkable. RepeatCT abdomenwith nosignificant change in mild acute pancreatitisand without complication. No further management.  Bradycardia/atrial fibrillation: Resolved. Has currently sinus rhythm.  Continue holding metoprolol.   - Continuehome amiodarone 100 mg daily. -Keep holdingMetoprolol at discharge. - Continue cardiac monitoring - Eliquis 2.5 mg QD  Hypothyroidism: - Continue levothyroxine 75 mcg daily  Dispo: Anticipated discharge in approximately 2 days (Pending CIR/SNF admission)  Dewayne Hatch, MD 09/17/2018, 10:27 AM Pager:[phone]

## 2018-09-17 NOTE — Progress Notes (Signed)
Physical Therapy Treatment Patient Details Name: Maureen Stout MRN: [id number] DOB: [birth year] Today's Date: 09/17/2018    History of Present Illness Pt is a 85 y/o admitted with dizziness and weakness with several falls. Pt with bradycardia, aV block, urosepsis. PMhx: DM, Afib, HTN, bradycardia    PT Comments    Pt making steady progress with functional mobility. Tolerating further progression of ambulation with sitting rest breaks. Feel that pt would greatly benefit from further intensive therapy services at CIR prior to returning home to maximize her independence with functional mobility. Pt would continue to benefit from skilled physical therapy services at this time while admitted and after d/c to address the below listed limitations in order to improve overall safety and independence with functional mobility.  All VSS throughout.   Follow Up Recommendations  CIR;Supervision/Assistance - 24 hour     Equipment Recommendations  3in1 (PT)    Recommendations for Other Services       Precautions / Restrictions Precautions Precautions: Fall Restrictions Weight Bearing Restrictions: No    Mobility  Bed Mobility Overal bed mobility: Needs Assistance Bed Mobility: Supine to Sit     Supine to sit: Min assist     General bed mobility comments: increased time and effort, assistance needed for trunk elevation and use of bed pads to assist pt scooting forwards to EOB  Transfers Overall transfer level: Needs assistance Equipment used: Rolling walker (2 wheeled) Transfers: Sit to/from Stand Sit to Stand: Mod assist;Min assist;+2 physical assistance         General transfer comment: cueing for safe hand placement; pt stood from EOB x1 with mod A x2 and then from recliner chair x1 with min A x2  Ambulation/Gait Ambulation/Gait assistance: Min assist;+2 physical assistance;+2 safety/equipment(close chair follow) Gait Distance (Feet): 50 Feet(50' x2 with sitting rest  break) Assistive device: Rolling walker (2 wheeled) Gait Pattern/deviations: Step-through pattern;Trunk flexed;Shuffle;Decreased stride length Gait velocity: decreased   General Gait Details: frequent cueing to maintain proximity to RW; assistance for stability   Stairs             Wheelchair Mobility    Modified Rankin (Stroke Patients Only)       Balance Overall balance assessment: Needs assistance Sitting-balance support: No upper extremity supported;Feet supported Sitting balance-Leahy Scale: Fair     Standing balance support: Bilateral upper extremity supported Standing balance-Leahy Scale: Poor Standing balance comment: bilateral UE support of RW                            Cognition Arousal/Alertness: Awake/alert Behavior During Therapy: WFL for tasks assessed/performed Overall Cognitive Status: Impaired/Different from baseline Area of Impairment: Memory;Problem solving                     Memory: Decreased short-term memory       Problem Solving: Slow processing        Exercises      General Comments        Pertinent Vitals/Pain Pain Assessment: No/denies pain    Home Living                      Prior Function            PT Goals (current goals can now be found in the care plan section) Acute Rehab PT Goals PT Goal Formulation: With patient Time For Goal Achievement: 09/23/18 Potential to Achieve Goals: Fair Progress towards PT  goals: Progressing toward goals    Frequency    Min 3X/week      PT Plan Current plan remains appropriate    Co-evaluation              AM-PAC PT "6 Clicks" Mobility   Outcome Measure  Help needed turning from your back to your side while in a flat bed without using bedrails?: A Little Help needed moving from lying on your back to sitting on the side of a flat bed without using bedrails?: A Lot Help needed moving to and from a bed to a chair (including a  wheelchair)?: A Little Help needed standing up from a chair using your arms (e.g., wheelchair or bedside chair)?: A Lot Help needed to walk in hospital room?: A Little Help needed climbing 3-5 steps with a railing? : Total 6 Click Score: 14    End of Session Equipment Utilized During Treatment: Gait belt Activity Tolerance: Patient tolerated treatment well Patient left: in chair;with call bell/phone within reach;with chair alarm set Nurse Communication: Mobility status PT Visit Diagnosis: Other abnormalities of gait and mobility (R26.89);Muscle weakness (generalized) (M62.81);Difficulty in walking, not elsewhere classified (R26.2)     Time: 2035-5974 PT Time Calculation (min) (ACUTE ONLY): 29 min  Charges:  $Gait Training: 8-22 mins $Therapeutic Activity: 8-22 mins                     Sherie Don, [place], DPT  Acute Rehabilitation Services Pager [phone] Office [place] 09/17/2018, 11:50 AM

## 2018-09-17 NOTE — Progress Notes (Signed)
  Date: 09/17/2018  Patient name: Maureen Stout  Medical record number: [id number]  Date of birth: [birth year]   I have seen and evaluated this patient and I have discussed the plan of care with the house staff. Please see Dr. Darcey Nora note for complete details. I concur with her findings and plan.     Sid Falcon, MD 09/17/2018, 8:38 PM

## 2018-09-18 DIAGNOSIS — G9341 Metabolic encephalopathy: Secondary | ICD-10-CM

## 2018-09-18 DIAGNOSIS — R339 Retention of urine, unspecified: Secondary | ICD-10-CM

## 2018-09-18 LAB — CBC WITH DIFFERENTIAL/PLATELET
Abs Immature Granulocytes: 0.09 10*3/uL — ABNORMAL HIGH (ref 0.00–0.07)
Basophils Absolute: 0 10*3/uL (ref 0.0–0.1)
Basophils Relative: 1 %
Eosinophils Absolute: 0 10*3/uL (ref 0.0–0.5)
Eosinophils Relative: 0 %
HCT: 25.5 % — ABNORMAL LOW (ref 36.0–46.0)
Hemoglobin: 8.1 g/dL — ABNORMAL LOW (ref 12.0–15.0)
Immature Granulocytes: 1 %
Lymphocytes Relative: 16 %
Lymphs Abs: 1 10*3/uL (ref 0.7–4.0)
MCH: 30 pg (ref 26.0–34.0)
MCHC: 31.8 g/dL (ref 30.0–36.0)
MCV: 94.4 fL (ref 80.0–100.0)
Monocytes Absolute: 0.4 10*3/uL (ref 0.1–1.0)
Monocytes Relative: 6 %
Neutro Abs: 5.1 10*3/uL (ref 1.7–7.7)
Neutrophils Relative %: 76 %
Platelets: 177 10*3/uL (ref 150–400)
RBC: 2.7 MIL/uL — ABNORMAL LOW (ref 3.87–5.11)
RDW: 17.3 % — ABNORMAL HIGH (ref 11.5–15.5)
WBC: 6.6 10*3/uL (ref 4.0–10.5)
nRBC: 0 % (ref 0.0–0.2)

## 2018-09-18 LAB — COMPREHENSIVE METABOLIC PANEL
ALT: 108 U/L — ABNORMAL HIGH (ref 0–44)
AST: 114 U/L — ABNORMAL HIGH (ref 15–41)
Albumin: 2 g/dL — ABNORMAL LOW (ref 3.5–5.0)
Alkaline Phosphatase: 108 U/L (ref 38–126)
Anion gap: 8 (ref 5–15)
BUN: 32 mg/dL — ABNORMAL HIGH (ref 8–23)
CO2: 26 mmol/L (ref 22–32)
Calcium: 8.8 mg/dL — ABNORMAL LOW (ref 8.9–10.3)
Chloride: 105 mmol/L (ref 98–111)
Creatinine, Ser: 1.49 mg/dL — ABNORMAL HIGH (ref 0.44–1.00)
GFR calc Af Amer: 37 mL/min — ABNORMAL LOW (ref >60)
GFR calc non Af Amer: 32 mL/min — ABNORMAL LOW (ref >60)
Glucose, Bld: 114 mg/dL — ABNORMAL HIGH (ref 70–99)
Potassium: 4 mmol/L (ref 3.5–5.1)
Sodium: 139 mmol/L (ref 135–145)
Total Bilirubin: 0.4 mg/dL (ref 0.3–1.2)
Total Protein: 5 g/dL — ABNORMAL LOW (ref 6.5–8.1)

## 2018-09-18 LAB — GLUCOSE, CAPILLARY
Glucose-Capillary: 109 mg/dL — ABNORMAL HIGH (ref 70–99)
Glucose-Capillary: 111 mg/dL — ABNORMAL HIGH (ref 70–99)
Glucose-Capillary: 111 mg/dL — ABNORMAL HIGH (ref 70–99)
Glucose-Capillary: 118 mg/dL — ABNORMAL HIGH (ref 70–99)
Glucose-Capillary: 159 mg/dL — ABNORMAL HIGH (ref 70–99)
Glucose-Capillary: 174 mg/dL — ABNORMAL HIGH (ref 70–99)

## 2018-09-18 MED ORDER — SODIUM CHLORIDE 0.9 % IV SOLN
2.0000 g | Freq: Two times a day (BID) | INTRAVENOUS | Status: DC
  Administered 2018-09-18 – 2018-09-19 (×3): 2 g via INTRAVENOUS
  Filled 2018-09-18 (×4): qty 2

## 2018-09-18 MED ORDER — FUROSEMIDE 10 MG/ML IJ SOLN
20.0000 mg | Freq: Once | INTRAMUSCULAR | Status: AC
  Administered 2018-09-18: 20 mg via INTRAVENOUS
  Filled 2018-09-18: qty 2

## 2018-09-18 NOTE — Progress Notes (Signed)
   Subjective: pt doing well this morning, no abd or chest pain, no SOB.    Objective:  Vital signs in last 24 hours: Vitals:   09/17/18 2334 09/18/18 0523 09/18/18 0547 09/18/18 0812  BP: (!) 152/63   (!) 152/70  Pulse: 66   68  Resp: 14   18  Temp:  (!) 97.4 F (36.3 C)  (!) 97.4 F (36.3 C)  TempSrc:  Oral  Oral  SpO2: 95%   96%  Weight:   86.2 kg   Height:       Cardiac: JVP engorged, normal rate frequent ectopy, clear s1 and s2, no murmurs, rubs or gallops Pulmonary: bibasilar rales, not in distress, no increased WOB Abdominal: non distended abdomen, soft and nontender Extremities: 3+ non pitting edema bilaterally Psych: Alert, conversant, in good spirits   Assessment/Plan:  Principal Problem:   Sepsis due to Enterobacter species (HCC) Active Problems:   Paroxysmal atrial fibrillation (HCC)   Sinus bradycardia   Hypothyroidism   First degree AV block   Acute on chronic anemia   Acute pancreatitis without infection or necrosis   Bradycardia   Bacteremia   Hypoglycemia without diagnosis of diabetes mellitus  Metabolic encephalopathy 2/2 Urosepsis with Enterobacter bacteremia in setting of 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 Hypothermia improved without bair hugger. Lowest temp was 36.4 'C. Continuing IV antibionts (IV Cefepime day 10 of 14). Will probably continue antibiotic in IV form while waiting for Rehab placement, since we do not have a limited PO antibiotic options per susceptibility result and multiple resistance. Ciprofloxacin was an option but not prefered due to side effect and old age. Per pharmacy, third generation Cephalosporin such as Cefdinir wont be the best choice for enterobacter due to inducible resistance.)  - ContinueIV Cefepime (started on 4/22. stop date: 09/21/18) - Continue PT/OT. - Follow-upCIR admission vs SNF plcement. (Per CIR admission staff, patient will be reevaluated on [place] to see if she is stonge enough to  tolerates few hours of rehab. If not, may plan to DC to SNF) -Continue Pure wick until she is able to ambulates. (Avoid reinsertingFoley cath due to Hx of enterovesicular fistula. Use in and out cath if needed instead) - CMP daily - CBC daily  Urinary retention: uncertain etiology  -stopped oxybutinin -bladder scan again today, bladder scans daily  Anasarca/Volume overload: likely multifactorial severely low albumin, diastolic dysfunction may have progressed as well  -trial of 20mg  lasix IV today -follow renal function closely  Acute on chronic normocytic anemia and thrombocytopenia: Was likely due to acute illness and sepsis. S/p  Blood transfusion. Hb has remained stable. Held sub Q Heparine. -Can continue home dose of Eliquis -CBC daily  AKI:  Cr 3.0 initially.  Improving and trending down.  -BMPdaily  Acute pancreatitis:  Stable and asymptomatic. Abdominal exam in unremarkable. RepeatCT abdomenwith nosignificant change in mild acute pancreatitisand without complication. No further management.  Bradycardia/atrial fibrillation: Resolved.Has currently sinus rhythm.  Continue holding metoprolol.  - Continuehome amiodarone 100 mg daily. -Keep holdingMetoprolol at discharge. - Continue cardiac monitoring - Eliquis 2.5 mg QD  Hypothyroidism: - Continue levothyroxine 75 mcg daily  Dispo: Anticipated discharge in approximately 2-3 day(s).   Katherine Roan, MD 09/18/2018, 9:51 AM

## 2018-09-18 NOTE — Progress Notes (Signed)
  Date: 09/18/2018  Patient name: Maureen Stout  Medical record number: [id number]  Date of birth: 11/26/1931   I have seen and evaluated this patient and I have discussed the plan of care with the house staff. Please see their note for complete details. I concur with their findings with the following additions/corrections:   Continues to slowly improve.  Still requiring IV antibiotics as there is not a good choice for prolonged oral therapy for her.  If she is ready for discharge in the next couple days, we would be able to switch to cefdinir for the remaining few days of her course.  According to PT, improving mobility but would benefit from CIR at discharge.  Hopefully will be able to arrange this in the next day or 2.  She has ongoing anasarca, likely related to her hypoalbuminemia, but there may also be a component of intravascular volume overload given her elevated JVP, so we will diurese her today and see how she responds.  Lenice Pressman, M.D., Ph.D. 09/18/2018, 2:29 PM

## 2018-09-18 NOTE — Discharge Instructions (Signed)

## 2018-09-19 ENCOUNTER — Encounter (HOSPITAL_COMMUNITY): Payer: Self-pay

## 2018-09-19 ENCOUNTER — Telehealth: Payer: Self-pay | Admitting: Cardiology

## 2018-09-19 ENCOUNTER — Other Ambulatory Visit: Payer: Self-pay

## 2018-09-19 ENCOUNTER — Inpatient Hospital Stay (HOSPITAL_COMMUNITY)
Admission: RE | Admit: 2018-09-19 | Discharge: 2018-10-10 | DRG: 945 | Disposition: A | Discharge status: Home Health | Payer: Medicare PPO | Source: Intra-hospital | Attending: Physical Medicine & Rehabilitation | Admitting: Physical Medicine & Rehabilitation

## 2018-09-19 DIAGNOSIS — L97519 Non-pressure chronic ulcer of other part of right foot with unspecified severity: Secondary | ICD-10-CM | POA: Diagnosis not present

## 2018-09-19 DIAGNOSIS — I517 Cardiomegaly: Secondary | ICD-10-CM

## 2018-09-19 DIAGNOSIS — I480 Paroxysmal atrial fibrillation: Secondary | ICD-10-CM | POA: Diagnosis not present

## 2018-09-19 DIAGNOSIS — I5043 Acute on chronic combined systolic (congestive) and diastolic (congestive) heart failure: Secondary | ICD-10-CM | POA: Diagnosis not present

## 2018-09-19 DIAGNOSIS — N390 Urinary tract infection, site not specified: Secondary | ICD-10-CM

## 2018-09-19 DIAGNOSIS — Z79899 Other long term (current) drug therapy: Secondary | ICD-10-CM

## 2018-09-19 DIAGNOSIS — R633 Feeding difficulties: Secondary | ICD-10-CM | POA: Diagnosis not present

## 2018-09-19 DIAGNOSIS — Z87440 Personal history of urinary (tract) infections: Secondary | ICD-10-CM

## 2018-09-19 DIAGNOSIS — I2721 Secondary pulmonary arterial hypertension: Secondary | ICD-10-CM

## 2018-09-19 DIAGNOSIS — N183 Chronic kidney disease, stage 3 unspecified: Secondary | ICD-10-CM

## 2018-09-19 DIAGNOSIS — R5381 Other malaise: Secondary | ICD-10-CM | POA: Diagnosis not present

## 2018-09-19 DIAGNOSIS — I361 Nonrheumatic tricuspid (valve) insufficiency: Secondary | ICD-10-CM | POA: Diagnosis not present

## 2018-09-19 DIAGNOSIS — Z931 Gastrostomy status: Secondary | ICD-10-CM

## 2018-09-19 DIAGNOSIS — R680 Hypothermia, not associated with low environmental temperature: Secondary | ICD-10-CM | POA: Diagnosis not present

## 2018-09-19 DIAGNOSIS — R627 Adult failure to thrive: Secondary | ICD-10-CM | POA: Diagnosis present

## 2018-09-19 DIAGNOSIS — I130 Hypertensive heart and chronic kidney disease with heart failure and stage 1 through stage 4 chronic kidney disease, or unspecified chronic kidney disease: Secondary | ICD-10-CM | POA: Diagnosis not present

## 2018-09-19 DIAGNOSIS — E875 Hyperkalemia: Secondary | ICD-10-CM | POA: Diagnosis not present

## 2018-09-19 DIAGNOSIS — R740 Nonspecific elevation of levels of transaminase and lactic acid dehydrogenase [LDH]: Secondary | ICD-10-CM

## 2018-09-19 DIAGNOSIS — R404 Transient alteration of awareness: Secondary | ICD-10-CM | POA: Diagnosis not present

## 2018-09-19 DIAGNOSIS — E039 Hypothyroidism, unspecified: Secondary | ICD-10-CM | POA: Diagnosis present

## 2018-09-19 DIAGNOSIS — E43 Unspecified severe protein-calorie malnutrition: Secondary | ICD-10-CM | POA: Diagnosis present

## 2018-09-19 DIAGNOSIS — D696 Thrombocytopenia, unspecified: Secondary | ICD-10-CM | POA: Diagnosis present

## 2018-09-19 DIAGNOSIS — Z79890 Hormone replacement therapy (postmenopausal): Secondary | ICD-10-CM

## 2018-09-19 DIAGNOSIS — Z96653 Presence of artificial knee joint, bilateral: Secondary | ICD-10-CM | POA: Diagnosis present

## 2018-09-19 DIAGNOSIS — E785 Hyperlipidemia, unspecified: Secondary | ICD-10-CM | POA: Diagnosis present

## 2018-09-19 DIAGNOSIS — Z881 Allergy status to other antibiotic agents status: Secondary | ICD-10-CM

## 2018-09-19 DIAGNOSIS — D631 Anemia in chronic kidney disease: Secondary | ICD-10-CM | POA: Diagnosis present

## 2018-09-19 DIAGNOSIS — M7989 Other specified soft tissue disorders: Secondary | ICD-10-CM | POA: Diagnosis not present

## 2018-09-19 DIAGNOSIS — Z4659 Encounter for fitting and adjustment of other gastrointestinal appliance and device: Secondary | ICD-10-CM | POA: Diagnosis not present

## 2018-09-19 DIAGNOSIS — I5030 Unspecified diastolic (congestive) heart failure: Secondary | ICD-10-CM | POA: Diagnosis not present

## 2018-09-19 DIAGNOSIS — T502X5A Adverse effect of carbonic-anhydrase inhibitors, benzothiadiazides and other diuretics, initial encounter: Secondary | ICD-10-CM | POA: Diagnosis present

## 2018-09-19 DIAGNOSIS — K859 Acute pancreatitis without necrosis or infection, unspecified: Secondary | ICD-10-CM | POA: Diagnosis not present

## 2018-09-19 DIAGNOSIS — I340 Nonrheumatic mitral (valve) insufficiency: Secondary | ICD-10-CM | POA: Diagnosis not present

## 2018-09-19 DIAGNOSIS — I951 Orthostatic hypotension: Secondary | ICD-10-CM | POA: Diagnosis not present

## 2018-09-19 DIAGNOSIS — Z90711 Acquired absence of uterus with remaining cervical stump: Secondary | ICD-10-CM | POA: Diagnosis not present

## 2018-09-19 DIAGNOSIS — R0902 Hypoxemia: Secondary | ICD-10-CM | POA: Diagnosis not present

## 2018-09-19 DIAGNOSIS — I5041 Acute combined systolic (congestive) and diastolic (congestive) heart failure: Secondary | ICD-10-CM

## 2018-09-19 DIAGNOSIS — D649 Anemia, unspecified: Secondary | ICD-10-CM | POA: Diagnosis present

## 2018-09-19 DIAGNOSIS — N179 Acute kidney failure, unspecified: Secondary | ICD-10-CM | POA: Diagnosis present

## 2018-09-19 DIAGNOSIS — I10 Essential (primary) hypertension: Secondary | ICD-10-CM | POA: Diagnosis not present

## 2018-09-19 DIAGNOSIS — F05 Delirium due to known physiological condition: Secondary | ICD-10-CM | POA: Diagnosis not present

## 2018-09-19 DIAGNOSIS — J90 Pleural effusion, not elsewhere classified: Secondary | ICD-10-CM | POA: Diagnosis not present

## 2018-09-19 DIAGNOSIS — Z9189 Other specified personal risk factors, not elsewhere classified: Secondary | ICD-10-CM | POA: Diagnosis not present

## 2018-09-19 DIAGNOSIS — R601 Generalized edema: Secondary | ICD-10-CM | POA: Diagnosis not present

## 2018-09-19 DIAGNOSIS — R1310 Dysphagia, unspecified: Secondary | ICD-10-CM | POA: Diagnosis not present

## 2018-09-19 DIAGNOSIS — E876 Hypokalemia: Secondary | ICD-10-CM

## 2018-09-19 DIAGNOSIS — N8110 Cystocele, unspecified: Secondary | ICD-10-CM | POA: Diagnosis present

## 2018-09-19 DIAGNOSIS — N189 Chronic kidney disease, unspecified: Secondary | ICD-10-CM | POA: Insufficient documentation

## 2018-09-19 DIAGNOSIS — Z803 Family history of malignant neoplasm of breast: Secondary | ICD-10-CM | POA: Diagnosis not present

## 2018-09-19 DIAGNOSIS — N184 Chronic kidney disease, stage 4 (severe): Secondary | ICD-10-CM | POA: Diagnosis present

## 2018-09-19 DIAGNOSIS — Z6827 Body mass index (BMI) 27.0-27.9, adult: Secondary | ICD-10-CM

## 2018-09-19 DIAGNOSIS — Y92239 Unspecified place in hospital as the place of occurrence of the external cause: Secondary | ICD-10-CM | POA: Diagnosis present

## 2018-09-19 DIAGNOSIS — G9340 Encephalopathy, unspecified: Secondary | ICD-10-CM

## 2018-09-19 DIAGNOSIS — D638 Anemia in other chronic diseases classified elsewhere: Secondary | ICD-10-CM | POA: Diagnosis not present

## 2018-09-19 DIAGNOSIS — R41841 Cognitive communication deficit: Secondary | ICD-10-CM | POA: Diagnosis present

## 2018-09-19 DIAGNOSIS — Z781 Physical restraint status: Secondary | ICD-10-CM

## 2018-09-19 DIAGNOSIS — Z7901 Long term (current) use of anticoagulants: Secondary | ICD-10-CM

## 2018-09-19 DIAGNOSIS — G9341 Metabolic encephalopathy: Secondary | ICD-10-CM | POA: Diagnosis not present

## 2018-09-19 DIAGNOSIS — R7881 Bacteremia: Secondary | ICD-10-CM | POA: Diagnosis not present

## 2018-09-19 DIAGNOSIS — I952 Hypotension due to drugs: Secondary | ICD-10-CM

## 2018-09-19 DIAGNOSIS — D62 Acute posthemorrhagic anemia: Secondary | ICD-10-CM | POA: Diagnosis not present

## 2018-09-19 DIAGNOSIS — K224 Dyskinesia of esophagus: Secondary | ICD-10-CM

## 2018-09-19 DIAGNOSIS — I169 Hypertensive crisis, unspecified: Secondary | ICD-10-CM | POA: Diagnosis not present

## 2018-09-19 DIAGNOSIS — I4891 Unspecified atrial fibrillation: Secondary | ICD-10-CM | POA: Diagnosis not present

## 2018-09-19 DIAGNOSIS — A499 Bacterial infection, unspecified: Secondary | ICD-10-CM

## 2018-09-19 DIAGNOSIS — I082 Rheumatic disorders of both aortic and tricuspid valves: Secondary | ICD-10-CM | POA: Diagnosis present

## 2018-09-19 DIAGNOSIS — R6251 Failure to thrive (child): Secondary | ICD-10-CM

## 2018-09-19 DIAGNOSIS — R339 Retention of urine, unspecified: Secondary | ICD-10-CM

## 2018-09-19 DIAGNOSIS — E1122 Type 2 diabetes mellitus with diabetic chronic kidney disease: Secondary | ICD-10-CM | POA: Diagnosis not present

## 2018-09-19 DIAGNOSIS — R609 Edema, unspecified: Secondary | ICD-10-CM

## 2018-09-19 DIAGNOSIS — B952 Enterococcus as the cause of diseases classified elsewhere: Secondary | ICD-10-CM | POA: Diagnosis present

## 2018-09-19 HISTORY — DX: Other malaise: R53.81

## 2018-09-19 LAB — GLUCOSE, CAPILLARY
Glucose-Capillary: 105 mg/dL — ABNORMAL HIGH (ref 70–99)
Glucose-Capillary: 109 mg/dL — ABNORMAL HIGH (ref 70–99)
Glucose-Capillary: 113 mg/dL — ABNORMAL HIGH (ref 70–99)
Glucose-Capillary: 127 mg/dL — ABNORMAL HIGH (ref 70–99)

## 2018-09-19 LAB — CBC WITH DIFFERENTIAL/PLATELET
Abs Immature Granulocytes: 0.06 10*3/uL (ref 0.00–0.07)
Basophils Absolute: 0 10*3/uL (ref 0.0–0.1)
Basophils Relative: 0 %
Eosinophils Absolute: 0 10*3/uL (ref 0.0–0.5)
Eosinophils Relative: 0 %
HCT: 24.3 % — ABNORMAL LOW (ref 36.0–46.0)
Hemoglobin: 7.6 g/dL — ABNORMAL LOW (ref 12.0–15.0)
Immature Granulocytes: 1 %
Lymphocytes Relative: 19 %
Lymphs Abs: 1 10*3/uL (ref 0.7–4.0)
MCH: 29.7 pg (ref 26.0–34.0)
MCHC: 31.3 g/dL (ref 30.0–36.0)
MCV: 94.9 fL (ref 80.0–100.0)
Monocytes Absolute: 0.3 10*3/uL (ref 0.1–1.0)
Monocytes Relative: 6 %
Neutro Abs: 3.9 10*3/uL (ref 1.7–7.7)
Neutrophils Relative %: 74 %
Platelets: 188 10*3/uL (ref 150–400)
RBC: 2.56 MIL/uL — ABNORMAL LOW (ref 3.87–5.11)
RDW: 17.5 % — ABNORMAL HIGH (ref 11.5–15.5)
WBC: 5.3 10*3/uL (ref 4.0–10.5)
nRBC: 0 % (ref 0.0–0.2)

## 2018-09-19 LAB — COMPREHENSIVE METABOLIC PANEL
ALT: 92 U/L — ABNORMAL HIGH (ref 0–44)
AST: 88 U/L — ABNORMAL HIGH (ref 15–41)
Albumin: 1.9 g/dL — ABNORMAL LOW (ref 3.5–5.0)
Alkaline Phosphatase: 103 U/L (ref 38–126)
Anion gap: 7 (ref 5–15)
BUN: 29 mg/dL — ABNORMAL HIGH (ref 8–23)
CO2: 27 mmol/L (ref 22–32)
Calcium: 8.5 mg/dL — ABNORMAL LOW (ref 8.9–10.3)
Chloride: 105 mmol/L (ref 98–111)
Creatinine, Ser: 1.52 mg/dL — ABNORMAL HIGH (ref 0.44–1.00)
GFR calc Af Amer: 36 mL/min — ABNORMAL LOW (ref >60)
GFR calc non Af Amer: 31 mL/min — ABNORMAL LOW (ref >60)
Glucose, Bld: 117 mg/dL — ABNORMAL HIGH (ref 70–99)
Potassium: 3.8 mmol/L (ref 3.5–5.1)
Sodium: 139 mmol/L (ref 135–145)
Total Bilirubin: 0.5 mg/dL (ref 0.3–1.2)
Total Protein: 4.9 g/dL — ABNORMAL LOW (ref 6.5–8.1)

## 2018-09-19 LAB — NOVEL CORONAVIRUS, NAA (HOSP ORDER, SEND-OUT TO REF LAB; TAT 18-24 HRS): SARS-CoV-2, NAA: NOT DETECTED

## 2018-09-19 MED ORDER — LEVOTHYROXINE SODIUM 75 MCG PO TABS
75.0000 ug | ORAL_TABLET | Freq: Every day | ORAL | Status: DC
  Administered 2018-09-20 – 2018-10-10 (×20): 75 ug via ORAL
  Filled 2018-09-19 (×21): qty 1

## 2018-09-19 MED ORDER — SODIUM CHLORIDE 0.9% FLUSH
10.0000 mL | Freq: Two times a day (BID) | INTRAVENOUS | Status: DC
  Administered 2018-09-20 – 2018-10-10 (×26): 10 mL

## 2018-09-19 MED ORDER — SENNOSIDES-DOCUSATE SODIUM 8.6-50 MG PO TABS
1.0000 | ORAL_TABLET | Freq: Every evening | ORAL | Status: DC | PRN
  Administered 2018-10-03 – 2018-10-04 (×2): 1 via ORAL
  Filled 2018-09-19 (×2): qty 1

## 2018-09-19 MED ORDER — FLEET ENEMA 7-19 GM/118ML RE ENEM: 1.0000 | ENEMA | Freq: Once | RECTAL | Status: DC | PRN

## 2018-09-19 MED ORDER — PROCHLORPERAZINE EDISYLATE 10 MG/2ML IJ SOLN
5.0000 mg | Freq: Four times a day (QID) | INTRAMUSCULAR | Status: DC | PRN
  Filled 2018-09-19: qty 2

## 2018-09-19 MED ORDER — ALUM & MAG HYDROXIDE-SIMETH 200-200-20 MG/5ML PO SUSP: 30.0000 mL | ORAL | Status: DC | PRN

## 2018-09-19 MED ORDER — GUAIFENESIN-DM 100-10 MG/5ML PO SYRP: 5.0000 mL | ORAL_SOLUTION | Freq: Four times a day (QID) | ORAL | Status: DC | PRN

## 2018-09-19 MED ORDER — SODIUM CHLORIDE 0.9 % IV SOLN
2.0000 g | Freq: Two times a day (BID) | INTRAVENOUS | Status: AC
  Administered 2018-09-20 (×2): 2 g via INTRAVENOUS
  Filled 2018-09-19 (×2): qty 2

## 2018-09-19 MED ORDER — FUROSEMIDE 10 MG/ML IJ SOLN
20.0000 mg | Freq: Once | INTRAMUSCULAR | Status: AC
  Administered 2018-09-19: 20 mg via INTRAVENOUS
  Filled 2018-09-19: qty 2

## 2018-09-19 MED ORDER — BISACODYL 10 MG RE SUPP
10.0000 mg | Freq: Every day | RECTAL | Status: DC | PRN
  Administered 2018-10-05: 10 mg via RECTAL
  Filled 2018-09-19: qty 1

## 2018-09-19 MED ORDER — CALCIUM CARBONATE ANTACID 500 MG PO CHEW
1.0000 | CHEWABLE_TABLET | Freq: Four times a day (QID) | ORAL | Status: DC | PRN
  Filled 2018-09-19: qty 2

## 2018-09-19 MED ORDER — AMIODARONE HCL 100 MG PO TABS: 100.0000 mg | ORAL_TABLET | Freq: Every day | ORAL | 1 refills | Status: DC

## 2018-09-19 MED ORDER — APIXABAN 2.5 MG PO TABS
2.5000 mg | ORAL_TABLET | Freq: Two times a day (BID) | ORAL | Status: DC
  Administered 2018-09-19 – 2018-10-10 (×40): 2.5 mg via ORAL
  Filled 2018-09-19 (×43): qty 1

## 2018-09-19 MED ORDER — PROCHLORPERAZINE MALEATE 5 MG PO TABS
5.0000 mg | ORAL_TABLET | Freq: Four times a day (QID) | ORAL | Status: DC | PRN
  Administered 2018-09-20 (×2): 10 mg via ORAL
  Filled 2018-09-19 (×3): qty 2

## 2018-09-19 MED ORDER — ACETAMINOPHEN 325 MG PO TABS
325.0000 mg | ORAL_TABLET | ORAL | Status: DC | PRN
  Administered 2018-09-21: 20:00:00 325 mg via ORAL
  Administered 2018-09-22 – 2018-10-08 (×9): 650 mg via ORAL
  Filled 2018-09-19: qty 1
  Filled 2018-09-19 (×10): qty 2

## 2018-09-19 MED ORDER — SODIUM CHLORIDE 0.9% FLUSH
10.0000 mL | INTRAVENOUS | Status: DC | PRN
  Administered 2018-09-23 – 2018-10-10 (×3): 10 mL
  Filled 2018-09-19 (×3): qty 40

## 2018-09-19 MED ORDER — AMIODARONE HCL 200 MG PO TABS
100.0000 mg | ORAL_TABLET | Freq: Every day | ORAL | Status: DC
  Administered 2018-09-20 – 2018-10-10 (×20): 100 mg via ORAL
  Filled 2018-09-19 (×21): qty 1

## 2018-09-19 MED ORDER — CALCIUM CARBONATE-VITAMIN D 500-200 MG-UNIT PO TABS
1.0000 | ORAL_TABLET | Freq: Two times a day (BID) | ORAL | Status: DC
  Administered 2018-09-19 – 2018-10-10 (×39): 1 via ORAL
  Filled 2018-09-19 (×42): qty 1

## 2018-09-19 MED ORDER — POLYETHYLENE GLYCOL 3350 17 G PO PACK
17.0000 g | PACK | Freq: Every day | ORAL | Status: DC
  Administered 2018-09-20 – 2018-10-10 (×19): 17 g via ORAL
  Filled 2018-09-19 (×19): qty 1

## 2018-09-19 MED ORDER — CEFDINIR 300 MG PO CAPS: 300.0000 mg | ORAL_CAPSULE | Freq: Two times a day (BID) | ORAL | 0 refills | Status: DC

## 2018-09-19 MED ORDER — PROCHLORPERAZINE 25 MG RE SUPP: 12.5000 mg | Freq: Four times a day (QID) | RECTAL | Status: DC | PRN

## 2018-09-19 MED ORDER — POLYETHYLENE GLYCOL 3350 17 G PO PACK: 17.0000 g | PACK | Freq: Every day | ORAL | Status: DC | PRN

## 2018-09-19 MED ORDER — LEVOTHYROXINE SODIUM 75 MCG PO TABS: 75.0000 ug | ORAL_TABLET | Freq: Every day | ORAL | 1 refills | Status: DC

## 2018-09-19 MED ORDER — ENSURE ENLIVE PO LIQD
237.0000 mL | Freq: Two times a day (BID) | ORAL | Status: DC
  Administered 2018-09-20: 237 mL via ORAL

## 2018-09-19 MED ORDER — ATORVASTATIN CALCIUM 10 MG PO TABS
10.0000 mg | ORAL_TABLET | Freq: Every day | ORAL | Status: DC
  Administered 2018-09-20 – 2018-10-10 (×20): 10 mg via ORAL
  Filled 2018-09-19 (×21): qty 1

## 2018-09-19 MED ORDER — TRAZODONE HCL 50 MG PO TABS
25.0000 mg | ORAL_TABLET | Freq: Every evening | ORAL | Status: DC | PRN
  Administered 2018-09-19 – 2018-09-21 (×2): 50 mg via ORAL
  Filled 2018-09-19 (×2): qty 1

## 2018-09-19 MED ORDER — RAMELTEON 8 MG PO TABS
8.0000 mg | ORAL_TABLET | Freq: Every day | ORAL | Status: DC
  Administered 2018-09-19 – 2018-09-24 (×5): 8 mg via ORAL
  Filled 2018-09-19 (×6): qty 1

## 2018-09-19 MED ORDER — DIPHENHYDRAMINE HCL 12.5 MG/5ML PO ELIX: 12.5000 mg | ORAL_SOLUTION | Freq: Four times a day (QID) | ORAL | Status: DC | PRN

## 2018-09-19 NOTE — Discharge Summary (Addendum)
Name: Maureen Stout MRN: [id number] DOB: 10-Mar-1932 85 y.o. PCP: Nicoletta Dress, MD  Date of Admission: 09/06/2018  2:06 PM Date of Discharge: 09/20/2018 Attending Physician: Lenice Pressman, MD, PhD  Discharge Diagnosis: 1. Principal Problem:   Sepsis due to Enterobacter species [facility]) Active Problems:   Paroxysmal atrial fibrillation (HCC)   Pressure injury of skin   Sinus bradycardia   Hypothyroidism   First degree AV block   Acute on chronic anemia   Acute pancreatitis without infection or necrosis   Bradycardia   Bacteremia   Hypoglycemia without diagnosis of diabetes mellitus    Discharge Medications: Allergies as of 09/19/2018      Reactions   Vancomycin Rash      Medication List    STOP taking these medications   amLODipine 5 MG tablet Commonly known as:  NORVASC   ampicillin 500 MG capsule Commonly known as:  PRINCIPEN   metoprolol tartrate 25 MG tablet Commonly known as:  LOPRESSOR   oxybutynin 5 MG tablet Commonly known as:  DITROPAN     TAKE these medications   allopurinol 100 MG tablet Commonly known as:  ZYLOPRIM Take 100 mg by mouth at bedtime.   amiodarone 100 MG tablet Commonly known as:  PACERONE Take 1 tablet (100 mg total) by mouth daily. Start taking on:  Sep 20, 2018 What changed:    medication strength  how much to take   atorvastatin 10 MG tablet Commonly known as:  LIPITOR Take 10 mg by mouth daily.   CALCIUM-D PO Take 1 tablet by mouth daily.   cefdinir 300 MG capsule Commonly known as:  OMNICEF Take 1 capsule (300 mg total) by mouth 2 (two) times daily.   Eliquis 2.5 MG Tabs tablet Generic drug:  apixaban TAKE 1 TABLET TWICE DAILY What changed:  how much to take   Fish Oil 1200 MG Caps Take 1,200 mg by mouth 2 (two) times daily.   levothyroxine 75 MCG tablet Commonly known as:  SYNTHROID Take 1 tablet (75 mcg total) by mouth daily before breakfast. Start taking on:  Sep 20, 2018 What changed:    medication  strength  how much to take   Medihoney Wound/Burn Dressing Gel Apply a small amount to wounds daily   Multi-Vitamins Tabs Take 1 tablet by mouth daily.   niacin 500 MG CR capsule Take 500 mg by mouth daily.   silver sulfADIAZINE 1 % cream Commonly known as:  Silvadene Apply 1 application topically daily. To foot ulcers What changed:    when to take this  reasons to take this  additional instructions       Disposition and follow-up:   Maureen Stout was discharged from [facility] in Stable condition.  At the hospital follow up visit please address:  1. Patient presented with sepsis due to UTI and enterobacter bacteremia. Treated with IV cefepime for 12 days, please make sure she continues treatment with PO cefdinir (last dose 09/21/2018). After completion, please resume amoxicillin nightly.  2. She developed some urinary retention that was managed with in and out cath and holding oxybutynin. Please do bladder scan daily. Try to avoid foley if possible as she has enterovesicular fistula. Please make sure she follows up with Urology after discharge.  3.  Patient had bradaycardia on arrival. Metoprolol held and home Amiodarone dosage decreased from 200 mg to 100 mg QD. Recommending keep holding Metoprolol and continue Amiodarone 100 mgQD after discharge unless she develops tachycardia/Afib.  4. Levothyroxine dosage increased to 75 mg QD. Please check TSH at follow up visit.  5. Stopped Amlodipine. Resume if BP sustained >140/90  6.  Labs / imaging needed at time of follow-up: CBC, CMP  7.  Pending labs/ test needing follow-up: None  Follow-up Appointments: Follow-up Information    Nicoletta Dress, MD. Schedule an appointment as soon as possible for a visit in 1 week(s).   Specialty:  Internal Medicine Why:  Schedule f/u appointmet within a aweek after discharging from hospital to home Contact information: [place] [address] [phone]        Richardo Priest, MD .   Specialty:  Cardiology Contact information: [address] [place] [address] [phone]           Hospital Course by problem list:  UTI with sepsis due to enterobacter: Maureen Stout is an 85 y.o. female with past medical history significant for vesicoenteric fistula and chronic UTI, paroxysmal A. fib, hypothyroidism who presented with dizziness, weakness, and and altered mental status.  She was altered, bradycardic and hypothermic on arrival found to have urosepsis with Enterobacter cloacae bacteremia. She was started on IV cefepime. Her mental status gradually improved. She had consistent  hypothermia initially managed with bair hugger, but she was able to maintain her own temperature for multiple days before discharge. Repeat blood cultures were negative, and CT abdomen/pelvis showed no abscess or complication. She received 12 days of IV Cefepime in hospital and planned to discharge to inpatient rehab. She will continue cefdinir for 2 more days to complete 14 days of treatment, after which she should resume ampicillin 500 mg nightly.   Urinary retention:  Patient had intermittent urinary retention after removing Foley catheter. Home oxybutynin stopped and intermittent cath was done as needed. We have tried to avoid reinserting foley catheter due to hx of enterovesicular fistula and recurrent UTI. She should f/u with urologist outpatient.  Symptomatic bradycardia: Patient with Hx of hypothyroidism, on 50 mcg Levothyroxin and Hx of Afib on Metoprolol and Amiodarone 200 mg QD at home.  Levothyroxine dosage increased to 75 mcg QD. Metoprolol and Amiodarone held, then resumed Amiodarone with lower dosage at 100 mg daily when her bradycardia resolved. Patient is discharged with Amiodarone 100 mg daily and Levothyroxine 75 mcg daily.   Anasarca: Likely related to hypoalbuminemia and chronic HFpEF, possibly also  undertreated hypothyroidism. Improved with gentle diuresis, will benefit from improved mobility and nutrition.  Acute on Chronic normocytic anemia and thrombocytopenia: Prior baseline unknown except Hgb 11.8 in 2018. Hgb 8.8 on arrival, dropped as low as 6.7 and received 1 unit pRBCs, stable thereafter, 7.6 at discharge. Platelets dropped from 168 to 93, then recovered on their own, 190 at discharge. Likely due to acute illness and sepsis.  Hypothyroidism: Patient was on Levothyroxine 50 mcg at home. She had bradycardia, volume overload on exam. TSH was elevated at 9.21. Levothyroxine increased to 75 mcg QD.  Transaminates: Likely in setting of sepsis vs congestion. RUQ [place] with no acute finding. Improved.  Acute pancreatitis, elevated transaminases: No symptoms and was diagnosed by CT abdomen on arrival. Lipase 311, AST/ALT peaked at 114/108. RUQ [place] with gallstones and gallbladder dilation but no inflammation, CBD normal, patent portal vein, and small amount of perihepatic ascites. Follow up CT abdomen/pelvis showed no complication. Possibly resolved gallstone pancreatitis, will need follow up LFTs.  AKI: Cr was 3 on arrival (baseline 1-1.3). Trended down to 1.5  at discharge with gentle hydration.  Discharge Vitals:   BP (!) 149/63 (BP Location: Left Arm)   Pulse 64   Temp (!) 97.4 F (36.3 C) (Oral)   Resp 14   Ht 5\' 6"  (1.676 m)   Wt 87.3 kg   SpO2 93%   BMI 31.06 kg/m   Pertinent Labs, Studies, and Procedures:  CMP Latest Ref Rng & Units 09/19/2018 09/18/2018 09/17/2018  Glucose 70 - 99 mg/dL 117(H) 114(H) 116(H)  BUN 8 - 23 mg/dL 29(H) 32(H) 34(H)  Creatinine 0.44 - 1.00 mg/dL 1.52(H) 1.49(H) 1.58(H)  Sodium 135 - 145 mmol/L 139 139 138  Potassium 3.5 - 5.1 mmol/L 3.8 4.0 3.7  Chloride 98 - 111 mmol/L 105 105 106  CO2 22 - 32 mmol/L 27 26 26   Calcium 8.9 - 10.3 mg/dL 8.5(L) 8.8(L) 8.8(L)  Total Protein 6.5 - 8.1 g/dL 4.9(L) 5.0(L) 5.3(L)  Total Bilirubin 0.3 - 1.2 mg/dL 0.5 0.4 0.5   Alkaline Phos 38 - 126 U/L 103 108 95  AST 15 - 41 U/L 88(H) 114(H) 55(H)  ALT 0 - 44 U/L 92(H) 108(H) 72(H)   Urine Culture >=100,000 COLONIES/mL ENTEROBACTER SPECIESAbnormal    Report Status 09/08/2018 FINAL   Organism ID, Bacteria ENTEROBACTER SPECIESAbnormal    Resulting Agency [facility]  Susceptibility    Enterobacter species    MIC    CEFAZOLIN >=64 RESIST... Resistant    CEFTRIAXONE <=1 SENSITIVE  Sensitive    CIPROFLOXACIN <=0.25 SENS... Sensitive    GENTAMICIN >=16 RESIST... Resistant    IMIPENEM 0.5 SENSITIVE  Sensitive    NITROFURANTOIN 64 INTERMED... Intermediate    PIP/TAZO 32 INTERMED... Intermediate    TRIMETH/SULFA >=320 RESIS... Resistant         Susceptibility Comments      CT abdomen pelvic, 09/12/2018  IMPRESSION: 1. No significant change in mild acute pancreatitis. No pseudocysts identified. 2. No other inflammatory process or abscess identified. 3. Increased diffuse body wall edema, bilateral pleural effusions, and bibasilar atelectasis. 4. Colonic diverticulosis, without radiographic evidence of diverticulitis. 5. Cholelithiasis. No radiographic evidence of cholecystitis.  Aortic Atherosclerosis (ICD10-I70.0).  RUQ [place]: 4/23  IMPRESSION: 1. Dilated gallbladder containing small stones. Negative for acute cholecystitis 2. Small amount of ascites in the right upper quadrant. Right pleural effusion.  CT abdomen pelvic 09/08/2018 IMPRESSION: 1. Inflammation and small volume free fluid in the abdomen and pelvis which is favored secondary to Acute Pancreatitis. However, there is diffuse large bowel diverticulosis and alternative diagnosis of acute diverticulitis is difficult to exclude. But, portions of the stomach and duodenum also appear secondarily inflamed which favors pancreatitis. No free air or drainable fluid collection. 2. Thick-walled urinary bladder containing gas suspicious for superimposed UTI. No obstructive uropathy. 3. Small  to moderate layering pleural effusions with lung base atelectasis. 4. Distended gallbladder with vicarious contrast excretion but no strong CT evidence of acute cholecystitis. 5.  Aortic Atherosclerosis (ICD10-I70.0).  Discharge Instructions: Discharge Instructions    Call MD for:  persistant dizziness or light-headedness   Complete by:  As directed    Call MD for:  persistant nausea and vomiting   Complete by:  As directed    Call MD for:  temperature >100.4   Complete by:  As directed    Diet - low sodium heart healthy   Complete by:  As directed    Diet - low sodium heart healthy   Complete by:  As directed    Discharge instructions   Complete by:  As  directed    Thank you for allowing [place] taking care of you at [facility]. We are glad you feel better. We discharge you to inpatient rehab floor to help you earn back your strength. You will finish course of IV antibiotic there.  For now: We held your Metoprolol, held Amlodipine,  and held Oxybutynin.  Be aware that we decreased dosage of Amiodarone,  And we increased dosage of Levothyroxine.  Please follow discharge instruction after being discharged from rehab and make sure to follow up with your urologist and your PCP for evaluation, blood work and adjusting your medications as needed.  Please call us at [phone] if you have any question or concern. Thank you   Increase activity slowly   Complete by:  As directed    Increase activity slowly   Complete by:  As directed       Signed: Dewayne Hatch, MD 09/19/2018, 3:37 PM   Pager: [phone]    Internal Medicine Attending Note:  I saw and examined the patient on the day of discharge. I reviewed and agree with the discharge summary written by the house staff.  Lenice Pressman, M.D., Ph.D.

## 2018-09-19 NOTE — Progress Notes (Signed)
Report called to [facility] RN inpatient rehab.  Patient given d/c instructions and verbalized understanding.  All belongings packed.  Patient to be transferred in bed to inpatient Rehab.

## 2018-09-19 NOTE — PMR Pre-admission (Signed)
PMR Admission Coordinator Pre-Admission Assessment  Patient: Maureen Stout is an 85 y.o., female MRN: [id number] DOB: [birth year] Height: '5\' 6"'  (167.6 cm) Weight: 87.3 kg  Insurance Information HMO:     PPO:      PCP: yes     IPA:      80/20:      OTHER: medicare advantage plan PRIMARY: [employer]      Policy#: [id number]      Subscriber: pt CM Name:       Phone#: [phone]     Fax#:  Pre-Cert#: [id number] approved automated due to COVID crisis. Enis Gash to f/u in 7 days [phone] ext [phone]      Employer:  Benefits:  Phone #: [phone]     Name:  Eff. Date: 05/18/18     Deduct: none      Out of Pocket Max: %6700      Life Max: none CIR: $450 co pay per day days 1 until 4      SNF: no co pay days 1 until 20; $178 co pay per day days 21 until 100 Outpatient: $20 co pay      Co-Pay: visits per medical neccesity Home Health: 100%      Co-Pay: visits per medical neccesity DME: 80%     Co-Pay: 20% Providers: in network  SECONDARY: guarantee trust life      Policy#: [id number]      Subscriber: pt  Medicaid Application Date:       Case Manager:  Disability Application Date:       Case Worker:   The "Data Collection Information Summary" for patients in Inpatient Rehabilitation Facilities with attached "Privacy Act [place] Records" was provided and verbally reviewed with: Patient and Family  Emergency Contact Information Contact Information    Name Relation Home Work Mobile   [name],[name] Son [phone]  [phone]      Current Medical History  Patient Admitting Diagnosis: debility; urosepsis  History of Present Illness: Maureen Stout is an 85 year old female with history of T2DM, PAF, CKD, entero vesicular fistula, sinus bradycardia who was admitted on 09/06/2018 with 1 week history of dizziness and weakness.  She was found to have marked bradycardia with heart rate in 30s, acute on chronic renal failure as well as elevated TSH-9.2.  Dr. Martinique recommended  discontinuation of amiodarone and metoprolol and AKI treated with IV fluids.  She developed hypotension and abdominal pain due to urosepsis and was started on IV cefepime for treatment.  CT abdomen revealed acute pancreatitis and treated with NPO and D5NS.   She has had issues with confusion and agitation due to delirium.  Mentation improved briefly but she developed lethargy with hypothermia felt to be due to sepsis.   As bradycardia resolved with amiodarone was resumed on 4/24.  Acute on chronic anemia treated with 1 unit packed red blood cells and thrombocytopenia being monitored. Follow up CT abdomen 4/27,showed no change in mild acute pancreatitis with increase in diffuse body wall edema with small to moderate bilateral pleural effusions. Anasarca  monitored initially and she required transfusion with additional unit of  PRBC 4/8  due to drop in H/H to 6.7/21.6.  She continues to have intermittent hypothermia and mentation slowly improving. She continues to require  I/O caths for urinary retention. Weights on upward trend to 198 lbs therefore treated with IV lasix 5/3 and 5/4.     Patient's medical record from [facility] has been reviewed by the rehabilitation  admission coordinator and physician.  Past Medical History  Past Medical History:  Diagnosis Date  . Acute on chronic anemia 09/07/2018  . Bladder problem   . CKD (chronic kidney disease), stage III ([place])   . Diabetes mellitus ([place])   . Dyslipidemia   . Hypertension   . Paroxysmal A-fib ([place])   . Paroxysmal atrial flutter ([place])   . Sinus bradycardia     Family History   family history includes Breast cancer in her sister; Cancer in her father.  Prior Rehab/Hospitalizations Has the patient had prior rehab or hospitalizations prior to admission? Yes  Has the patient had major surgery during 100 days prior to admission? No   Current Medications  Current Facility-Administered Medications:  .  0.9 %  sodium chloride  infusion (Manually program via Guardrails IV Fluids), , Intravenous, Once, Lorella Nimrod, MD .  acetaminophen (TYLENOL) tablet 650 mg, 650 mg, Oral, Q6H PRN, 650 mg at 09/07/18 0526 **OR** acetaminophen (TYLENOL) suppository 650 mg, 650 mg, Rectal, Q6H PRN, Chundi, Vahini, MD .  amiodarone (PACERONE) tablet 100 mg, 100 mg, Oral, Daily, Lelon Perla, MD, 100 mg at 09/19/18 0930 .  apixaban (ELIQUIS) tablet 2.5 mg, 2.5 mg, Oral, BID, Katherine Roan, MD, 2.5 mg at 09/19/18 0930 .  atorvastatin (LIPITOR) tablet 10 mg, 10 mg, Oral, Daily, Chundi, Vahini, MD, 10 mg at 09/19/18 0930 .  calcium carbonate (TUMS - dosed in mg elemental calcium) chewable tablet 200-400 mg of elemental calcium, 1-2 tablet, Oral, Q6H PRN, Dorrell, Andree Elk, MD, 400 mg of elemental calcium at 09/06/18 2322 .  calcium-vitamin D (OSCAL WITH D) 500-200 MG-UNIT per tablet, , Oral, BID, Chundi, Vahini, MD, 1 tablet at 09/19/18 0930 .  ceFEPIme (MAXIPIME) 2 g in sodium chloride 0.9 % 100 mL IVPB, 2 g, Intravenous, Q12H, Karren Cobble, RPH, Last Rate: 200 mL/hr at 09/19/18 1306, 2 g at 09/19/18 1306 .  feeding supplement (ENSURE ENLIVE) (ENSURE ENLIVE) liquid 237 mL, 237 mL, Oral, BID BM, Chundi, Vahini, MD, 237 mL at 09/17/18 1530 .  levothyroxine (SYNTHROID) tablet 75 mcg, 75 mcg, Oral, QAC breakfast, Lucious Groves, DO, 75 mcg at 09/19/18 0510 .  polyethylene glycol (MIRALAX / GLYCOLAX) packet 17 g, 17 g, Oral, Daily, Lorella Nimrod, MD, 17 g at 09/19/18 0931 .  promethazine (PHENERGAN) tablet 12.5 mg, 12.5 mg, Oral, Q6H PRN, Chundi, Vahini, MD .  ramelteon (ROZEREM) tablet 8 mg, 8 mg, Oral, QHS, Katherine Roan, MD, 8 mg at 09/18/18 2134 .  senna-docusate (Senokot-S) tablet 1 tablet, 1 tablet, Oral, QHS PRN, Chundi, Vahini, MD, 1 tablet at 09/06/18 2321 .  sodium chloride flush (NS) 0.9 % injection 10-40 mL, 10-40 mL, Intracatheter, PRN, Lucious Groves, DO, 10 mL at 09/16/18 2114  Patients Current Diet:  Diet  Order            DIET SOFT Room service appropriate? No; Fluid consistency: Thin  Diet effective now              Precautions / Restrictions Precautions Precautions: Fall Precaution Comments: picks at skin and lines, multiple UE lacerations Restrictions Weight Bearing Restrictions: No   Has the patient had 2 or more falls or a fall with injury in the past year? No  Prior Activity Level Limited Community (1-2x/wk): Mod I RW; did not drive  Prior Functional Level Self Care: Did the patient need help bathing, dressing, using the toilet or eating? Independent  Indoor Mobility: Did the patient need assistance  with walking from room to room (with or without device)? Independent  Stairs: Did the patient need assistance with internal or external stairs (with or without device)? Needed some help  Functional Cognition: Did the patient need help planning regular tasks such as shopping or remembering to take medications? Needed some help  Home Assistive Devices / [place] Devices/Equipment: Gilford Rile (specify type), Grab bars in shower Home Equipment: Walker - 2 wheels  Prior Device Use: Indicate devices/aids used by the patient prior to current illness, exacerbation or injury? Walker  Current Functional Level Cognition  Overall Cognitive Status: Impaired/Different from baseline Current Attention Level: Sustained Orientation Level: Oriented to person, Oriented to place, Oriented to situation, Disoriented to time Following Commands: Follows one step commands with increased time, Follows multi-step commands inconsistently Safety/Judgement: Decreased awareness of safety, Decreased awareness of deficits General Comments: Following commands with improved processing speed.    Extremity Assessment (includes Sensation/Coordination)  Upper Extremity Assessment: Generalized weakness  Lower Extremity Assessment: Defer to PT evaluation    ADLs  Overall ADL's : Needs  assistance/impaired Eating/Feeding: Set up, Sitting Grooming: Oral care, Standing, Min guard Grooming Details (indicate cue type and reason): pt set up her toothbrush in sitting and stood to brush teeth Upper Body Bathing: Moderate assistance Lower Body Bathing: Total assistance, Bed level Upper Body Dressing : Minimal assistance, Sitting Upper Body Dressing Details (indicate cue type and reason): front opening gown Lower Body Dressing: Total assistance Toilet Transfer: Moderate assistance, RW Toilet Transfer Details (indicate cue type and reason): simulated to chair with stedy Toileting- Clothing Manipulation and Hygiene: Total assistance, Sit to/from stand Functional mobility during ADLs: Minimal assistance, Rolling walker, +2 for safety/equipment General ADL Comments: Pt sat EOB to wash face. While sitting EOB, she states, "this is a bunch of crap." She initially was agreeable to brushing teeth once in chair. However, after she had transferred to chair she stated, "I'm not brushing teeth. I don't have any teeth." Therapist informed her that she does have teeth but pt continued to decline.     Mobility  Overal bed mobility: Needs Assistance Bed Mobility: Supine to Sit Rolling: Min assist Sidelying to sit: Mod assist Supine to sit: Min assist General bed mobility comments: increased time and effort, assistance needed for trunk elevation and use of bed pads to assist pt scooting forwards to EOB    Transfers  Overall transfer level: Needs assistance Equipment used: Rolling walker (2 wheeled) Transfer via Lift Equipment: Stedy Transfers: Sit to/from Stand Sit to Stand: Mod assist, Min assist, +2 physical assistance Stand pivot transfers: Total assist General transfer comment: cueing for safe hand placement; pt stood from EOB x1 with mod A x2 and then from recliner chair x1 with min A x2    Ambulation / Gait / Stairs / Wheelchair Mobility  Ambulation/Gait Ambulation/Gait assistance:  Min assist, +2 physical assistance, +2 safety/equipment(close chair follow) Gait Distance (Feet): 50 Feet(50' x2 with sitting rest break) Assistive device: Rolling walker (2 wheeled) Gait Pattern/deviations: Step-through pattern, Trunk flexed, Shuffle, Decreased stride length General Gait Details: frequent cueing to maintain proximity to RW; assistance for stability Gait velocity: decreased    Posture / Balance Balance Overall balance assessment: Needs assistance Sitting-balance support: No upper extremity supported, Feet supported Sitting balance-Leahy Scale: Fair Standing balance support: Bilateral upper extremity supported Standing balance-Leahy Scale: Poor Standing balance comment: bilateral UE support of RW    Special needs/care consideration BiPAP/CPAP  N/a CPM  N/a Continuous Drip IV  N/a Dialysis  N/a  Life Vest  N/a Oxygen  N/a Special Bed  N/a Trach Size  N/a Wound Vac n/a Skin  Stage 2 to right buttocks; edema to Bilateral LE; right and left foot with dry, black area; skin tear to right arm; ecchymosis to abdomen Bowel mgmt:  LBM 5/3 continent Bladder mgmt:  External catheter with some I and os Diabetic mgmt:  N/a Behavioral consideration  N/a Chemo/radiation  N/a   Previous Home Environment  Living Arrangements: Alone  Lives With: Alone Available Help at Discharge: Family, Available 24 hours/day(son, dtr in law and brother to provide at d/c) Type of Home: House Home Layout: One level Home Access: Ramped entrance Bathroom Shower/Tub: Multimedia programmer: Handicapped height Bathroom Accessibility: Yes How Accessible: Accessible via walker Home Care Services: No  Discharge Living Setting Plans for Discharge Living Setting: Patient's home, Alone Type of Home at Discharge: House Discharge Home Layout: One level Discharge Home Access: Ramped entrance Discharge Bathroom Shower/Tub: Walk-in shower Discharge Bathroom Toilet: Handicapped height Does the  patient have any problems obtaining your medications?: No  Social/Family/Support Systems Patient Roles: Parent Contact Information: son and his wife, Vicente Serene and Jeani Hawking Anticipated Caregiver: son, daughter in Sports coach, brother and sister in law Anticipated Caregiver's Contact Information: see above Ability/Limitations of Caregiver: none Caregiver Availability: 24/7 Discharge Plan Discussed with Primary Caregiver: Yes Is Caregiver In Agreement with Plan?: Yes Does Caregiver/Family have Issues with Lodging/Transportation while Pt is in Rehab?: Yes  Goals/Additional Needs Patient/Family Goal for Rehab: Mod I to supervision PT, superivsion to min OT, Mod I to supervision SLP Expected length of stay: ELOS 10 to 14 days Pt/Family Agrees to Admission and willing to participate: Yes Program Orientation Provided & Reviewed with Pt/Caregiver Including Roles  & Responsibilities: Yes  Decrease burden of Care through IP rehab admission: n/a  Possible need for SNF placement upon discharge:  Not anticipated  Patient Condition: I have reviewed medical records from [facility] , spoken with CM, and patient and son. I met with patient at the bedside for inpatient rehabilitation assessment.  Patient will benefit from ongoing PT, OT and SLP, can actively participate in 3 hours of therapy a day 5 days of the week, and can make measurable gains during the admission.  Patient will also benefit from the coordinated team approach during an Inpatient Acute Rehabilitation admission.  The patient will receive intensive therapy as well as Rehabilitation physician, nursing, social worker, and care management interventions.  Due to bladder management, bowel management, safety, skin/wound care, disease management, medication administration and patient education the patient requires 24 hour a day rehabilitation nursing.  The patient is currently mod assist with mobility and basic ADLs.  Discharge setting and therapy post  discharge at home with home health is anticipated.  Patient has agreed to participate in the Acute Inpatient Rehabilitation Program and will admit today.  Preadmission Screen Completed By:  Cleatrice Burke RN MSN 09/19/2018 1:34 PM ______________________________________________________________________   Discussed status with Dr. Naaman Plummer  on  09/19/2018 at 1332 and received approval for admission today.  Admission Coordinator:  Cleatrice Burke, RN MSN time [id number] Date 09/19/2018   Assessment/Plan: Diagnosis: debility and encephalopathy after urosepsis 1. Does the need for close, 24 hr/ 2. day Medical supervision in concert with the patient's rehab needs make it unreasonable for this patient to be served in a less intensive setting? Yes 3. Co-Morbidities requiring supervision/potential complications: DM2, PAF, CKD 4. Due to bladder management, bowel management, safety, skin/wound care, disease management, medication  administration, pain management and patient education, does the patient require 24 hr/day rehab nursing? Yes 5. Does the patient require coordinated care of a physician, rehab nurse, PT (1-2 hrs/day, 5 days/week), OT (1-2 hrs/day, 5 days/week) and SLP (1-2 hrs/day, 5 days/week) to address physical and functional deficits in the context of the above medical diagnosis(es)? Yes Addressing deficits in the following areas: balance, endurance, locomotion, strength, transferring, bowel/bladder control, bathing, dressing, feeding, grooming, toileting, cognition and psychosocial support 6. Can the patient actively participate in an intensive therapy program of at least 3 hrs of therapy 5 days a week? Yes 7. The potential for patient to make measurable gains while on inpatient rehab is excellent 8. Anticipated functional outcomes upon discharge from inpatients are: modified independent and supervision PT, supervision and min assist OT, modified independent and supervision SLP 9. Estimated  rehab length of stay to reach the above functional goals is: 10-14 days 10. Anticipated D/C setting: Home 11. Anticipated post D/C treatments: [place] therapy 12. Overall Rehab/Functional Prognosis: excellent  MD Signature: Meredith Staggers, MD, [facility] 09/19/2018

## 2018-09-19 NOTE — TOC Transition Note (Signed)
Transition of Care [facility]) - CM/SW Discharge Note   Patient Details  Name: Maureen Stout MRN: [id number] Date of Birth: 01-Oct-1931  [facility] CM/SW Contact:  Bethena Roys, RN Phone Number: 09/19/2018, 2:37 PM   Clinical Narrative:   Patient will transition to CIR today. No further needs from CM at this time.     Barriers to Discharge: Continued Medical Work up    Discharge Plan and Services   Discharge Planning Services: CM Consult               Social Determinants of Health (SDOH) Interventions     Readmission Risk Interventions No flowsheet data found.

## 2018-09-19 NOTE — Progress Notes (Addendum)
  Date: 09/19/2018  Patient name: Maureen Stout  Medical record number: [id number]  Date of birth: 03/02/1932   I have seen and evaluated this patient and I have discussed the plan of care with the house staff. Please see their note for complete details. I concur with their findings with the following additions/corrections:   Doing well on IV cefepime, day 12/14.  Slow improvement in functional status.  Appears to have diuresed well yesterday with furosemide, although I/O documentation incomplete.  She still has anasarca and mildly elevated JVP, will repeat IV furosemide again today.  Lenice Pressman, M.D., Ph.D. 09/19/2018, 12:14 PM   Addendum: For documentation purposes, please note I agree with nursing documentation of her pressure ulcer, which was present prior to admission.

## 2018-09-19 NOTE — Progress Notes (Signed)
In and out bladder cath done with NT Peggy for 750cc or urine.

## 2018-09-19 NOTE — Progress Notes (Signed)
Bladder scan done, patient noted to have 752ml of urine in bladder.  Dr. Myrtie Hawk paged.

## 2018-09-19 NOTE — Progress Notes (Signed)
   Subjective: No overnight events. This morning, Maureen Stout reports that she's not eating breakfast because the food here makes her feel sick. She denies nausea. She has not yet been out of bed this morning to walk around.   Objective:  Vital signs in last 24 hours: Vitals:   09/18/18 1618 09/18/18 1953 09/19/18 0007 09/19/18 0346  BP: (!) 153/63 136/67 135/64 136/69  Pulse: 62 65 61 64  Resp: 17 14 13 14   Temp: 97.6 F (36.4 C) (!) 97.5 F (36.4 C) (!) 97.5 F (36.4 C) 97.7 F (36.5 C)  TempSrc: Oral Oral Oral Oral  SpO2: 95% 95% 93% 93%  Weight:    87.3 kg  Height:       Physical Exam:  VS reviewed, nursing notes reviewed. General: Lying in the bed in NAD, she is alert CV: RRR, nl S1S2 Pulm: CTA bilaterally, o crackle, no wheezing Abdomen: Soft, non tender to palpation, BS are present and normal Musculoskeletal: bilateral lower extremity 3+ nonpitting edema    Assessment/Plan:  Principal Problem:   Sepsis due to Enterobacter species [place]) Active Problems:   Paroxysmal atrial fibrillation (HCC)   Pressure injury of skin   Sinus bradycardia   Hypothyroidism   First degree AV block   Acute on chronic anemia   Acute pancreatitis without infection or necrosis   Bradycardia   Bacteremia   Hypoglycemia without diagnosis of diabetes mellitus  Metabolic encephalopathy [id number] withEnterobacter bacteremiain setting ofUTI 2/2enterovesical fistula: Improved and stable. Continuing IV Cefepime (started 4/22) to finish 14 days course. We have limited PO antibiotic options per susceptibility result and multiple resistance.  No leuccytosis. BC remained negative>5 days. -ContinueIV Cefepime (started on 4/22.stop date: 09/21/18) - Continue PT/OT. - Follow-upCIRadmission vs SNF plcement. Appreciate CIR admission team reevaluation today. -Continue Pure wick until she is able to ambulates. (Avoid reinsertingFoley cathdue to Hx of enterovesicular fistula. Use in and  out cathifneeded instead) - CMP daily - CBC daily  Urinary retention: uncertain etiology. Improved after stopping Oxybutinin.  -Hold oxybutinin -Bladder scans daily -I and o cath as needed  Anasarca/Volume overload: Improved with 1 low dose (20 mg) of Lasix yesterday 5/4 but still has some JVD. likely multifactorial severely low albumin. diastolic dysfunction may have progressed as well. Cr 1.5. -Repeat 20mg  lasix IV   -follow renal function closely  Acute on chronicnormocytic anemia and thrombocytopenia: Was likely due to acute illness and sepsis. S/p Blood transfusion. Hb has remained stable. Held sub Q Heparine. -Can continue home dose of Eliquis -CBC daily  AKI:  Cr 3.0 initially.  Improvingand trending down gradually. Tolerated low dose of IV Lasix  -BMPdaily  Acute pancreatitis:  Stable and asymptomatic. Abdominal exam in unremarkable. Seems to be resoleved No further management.  Transaminates: likely 2/2 sepsis. Imrpved but bumped up yesterday. Likely 2/2 congestion. Improved today. Will monitor. -CMP daily  Bradycardia/atrial fibrillation: Resolved.Has currently sinus rhythm.  Continue holding metoprolol.  - Continue amiodarone 100 mg daily. (Will keep current dose after DC) -Keep holdingMetoprolol at discharge. - Continue cardiac monitoring - Continue Eliquis 2.5 mg QD  Hypothyroidism: - Continue levothyroxine 75 mcg daily   Dispo: Anticipated discharge in approximately 1-2 days. Awaiting CIR placement.   Maureen Stout, Maureen Stout 09/19/2018, 4:54 AM Pager: [phone]

## 2018-09-19 NOTE — Progress Notes (Signed)
Occupational Therapy Treatment Patient Details Name: Maureen Stout MRN: [id number] DOB: Dec 20, 1931 Today's Date: 09/19/2018    History of present illness Pt is a 85 y/o admitted with dizziness and weakness with several falls. Pt with bradycardia, aV block, urosepsis. PMhx: DM, Afib, HTN, bradycardia   OT comments  Pt with complaints of dizziness when seated on BSC and with ambulation. VSS monitored and stable. Pt less talkative than last visit, requiring more cues for sequencing during mobility. Pt to d/c to CIR today.  Follow Up Recommendations  CIR    Equipment Recommendations  Other (comment)(defer to next venue)    Recommendations for Other Services      Precautions / Restrictions Precautions Precautions: Fall       Mobility Bed Mobility Overal bed mobility: Needs Assistance Bed Mobility: Sit to Supine       Sit to supine: Min guard   General bed mobility comments: returned to bed for bladder scan  Transfers Overall transfer level: Needs assistance Equipment used: Rolling walker (2 wheeled) Transfers: Sit to/from [facility] Sit to Stand: Min assist;Mod assist;+2 safety/equipment Stand pivot transfers: Mod assist;+2 safety/equipment       General transfer comment: multimodal cues for hand placment with RW, assist to rise and steady and to move walker    Balance Overall balance assessment: Needs assistance   Sitting balance-Leahy Scale: Fair     Standing balance support: Bilateral upper extremity supported Standing balance-Leahy Scale: Poor Standing balance comment: bilateral UE support of RW                           ADL either performed or assessed with clinical judgement   ADL                   Upper Body Dressing : Moderate assistance;Sitting       Toilet Transfer: Moderate assistance;Stand-pivot;RW;BSC;+2 for safety/equipment   Toileting- Clothing Manipulation and Hygiene: +2 for safety/equipment;Total  assistance;Sit to/from stand       Functional mobility during ADLs: Moderate assistance;Rolling walker;+2 for safety/equipment General ADL Comments: Pt needing assist to guide walker.     Vision       Perception     Praxis      Cognition Arousal/Alertness: Awake/alert Behavior During Therapy: Flat affect Overall Cognitive Status: Impaired/Different from baseline Area of Impairment: Memory;Following commands;Safety/judgement;Problem solving;Orientation                 Orientation Level: Disoriented to;Time Current Attention Level: Sustained Memory: Decreased short-term memory Following Commands: Follows one step commands with increased time(and multimodal cues) Safety/Judgement: Decreased awareness of safety;Decreased awareness of deficits   Problem Solving: Slow processing;Decreased initiation;Difficulty sequencing;Requires verbal cues;Requires tactile cues General Comments: pt with difficulty generalizing sequence for sit to stand with RW        Exercises     Shoulder Instructions       General Comments      Pertinent Vitals/ Pain       Pain Assessment: No/denies pain  Home Living     Available Help at Discharge: Family;Available 24 hours/day(son, dtr in law and brother to provide at d/c)                     Bathroom Accessibility: Yes How Accessible: Accessible via walker        Lives With: Alone    Prior Functioning/Environment  Frequency  Min 2X/week        Progress Toward Goals  OT Goals(current goals can now be found in the care plan section)  Progress towards OT goals: Progressing toward goals  Acute Rehab OT Goals Patient Stated Goal: return home OT Goal Formulation: With patient Time For Goal Achievement: 09/23/18 Potential to Achieve Goals: Good  Plan Discharge plan remains appropriate;Frequency remains appropriate    Co-evaluation    PT/OT/SLP Co-Evaluation/Treatment: Yes Reason for  Co-Treatment: For patient/therapist safety   OT goals addressed during session: ADL's and self-care;Strengthening/ROM;Proper use of Adaptive equipment and DME      AM-PAC OT "6 Clicks" Daily Activity     Outcome Measure   Help from another person eating meals?: A Little Help from another person taking care of personal grooming?: A Little Help from another person toileting, which includes using toliet, bedpan, or urinal?: A Lot Help from another person bathing (including washing, rinsing, drying)?: A Lot Help from another person to put on and taking off regular upper body clothing?: A Lot Help from another person to put on and taking off regular lower body clothing?: Total 6 Click Score: 13    End of Session Equipment Utilized During Treatment: Gait belt;Rolling walker  OT Visit Diagnosis: Unsteadiness on feet (R26.81);Muscle weakness (generalized) (M62.81)   Activity Tolerance Patient tolerated treatment well   Patient Left in bed;with call bell/phone within reach;with nursing/sitter in room   Nurse Communication Other (comment)(aware of skin tear on buttocks)        Time: 3810-1751 OT Time Calculation (min): 32 min  Charges: OT General Charges $OT Visit: 1 Visit OT Treatments $Self Care/Home Management : 8-22 mins  Nestor Lewandowsky, OTR/L Acute Rehabilitation Services Pager: [phone] Office: [phone]   Maureen Stout 09/19/2018, 3:28 PM

## 2018-09-19 NOTE — Progress Notes (Addendum)
Inpatient Rehabilitation Admissions Coordinator  Pt's son and daughter in law have returned my call and are in agreement to inpt rehab admit, cost of care. They prefer inpt rehab rather than SNF. Noted pt on 5/1/ Covid testing is still pending . I spoke with [facility] and specimen collected 5/1, to [facility] 5/4 received with 2 to 4 days for results pending. I discussed with Dr. Naaman Plummer and he gave clearance to admit pt today. I contacted RN CM, Hassan Rowan, to assist with finding results.  Danne Baxter, RN, MSN Rehab Admissions Coordinator [phone] 09/19/2018 1:02 PM

## 2018-09-19 NOTE — H&P (Signed)
Physical Medicine and Rehabilitation Admission H&P        Chief Complaint  Patient presents with   Weakness due to debility and sepsis   Decline in functional status.     HPI:  Maureen Stout is an 85 year old female with history of T2DM, PAF, CKD, enterovesicular fistula, sinus bradycardia who was admitted on 09/06/2018 with 1 week history of dizziness and weakness.  She was found to have marked bradycardia with heart rate in 30s, acute on chronic renal failure as well as elevated TSH-9.2.  Dr. Martinique recommended discontinuation of amiodarone and metoprolol and AKI treated with IV fluids.  She developed hypotension and abdominal pain due to urosepsis and was started on IV cefepime for treatment.  CT abdomen revealed acute pancreatitis and treated with NPO and D5NS.   She has had issues with confusion and agitation due to delirium.  Mentation improved briefly but she developed lethargy with hypothermia felt to be due to sepsis.    As bradycardia resolved with amiodarone was resumed on 4/24.  Acute on chronic anemia treated with 1 unit packed red blood cells and thrombocytopenia being monitored. Follow up CT abdomen 4/27,showed no change in mild acute pancreatitis with increase in diffuse body wall edema with small to moderate bilateral pleural effusions. Anasarca  monitored initially and she required transfusion with additional unit of  PRBC 4/8  due to drop in H/H to 6.7/21.6.  She continues to have intermittent hypothermia and mentation slowly improving. She continues to require  I/O caths for urinary retention. Weights on upward trend to 198 lbs therefore treated with IV lasix 5/3 and 5/4.  Therapy ongoing and patient noted to be debilitated with poor safety awareness. CIR recommended due to functional decline.      Review of Systems  Constitutional: Negative for fever.  HENT: Positive for congestion.   Eyes: Negative for blurred vision.  Respiratory: Positive for cough and wheezing.     Cardiovascular: Positive for chest pain.  Gastrointestinal: Negative for nausea and vomiting.  Genitourinary: Negative.        Urine retention, overflow?  Musculoskeletal: Negative for neck pain.  Skin: Negative for itching.  Neurological: Negative for tremors.  Psychiatric/Behavioral: Negative for depression.            Past Medical History:  Diagnosis Date   Acute on chronic anemia 09/07/2018   Bladder problem     CKD (chronic kidney disease), stage III (HCC)     Diabetes mellitus (HCC)     Dyslipidemia     Hypertension     Paroxysmal A-fib (HCC)     Paroxysmal atrial flutter (HCC)     Sinus bradycardia             Past Surgical History:  Procedure Laterality Date   PARTIAL HYSTERECTOMY       TOE SURGERY       TONSILLECTOMY               Family History  Problem Relation Age of Onset   Cancer Father     Breast cancer Sister     Diabetes Neg Hx     Heart attack Neg Hx        Social History:  reports that she has never smoked. She has never used smokeless tobacco. She reports that she does not drink alcohol or use drugs.          Allergies  Allergen Reactions   Vancomycin Rash  Medications Prior to Admission  Medication Sig Dispense Refill   allopurinol (ZYLOPRIM) 100 MG tablet Take 100 mg by mouth at bedtime.        amiodarone (PACERONE) 200 MG tablet TAKE 1 TABLET EVERY DAY (Patient taking differently: Take 200 mg by mouth daily. ) 90 tablet 1   amLODipine (NORVASC) 5 MG tablet Take 5 mg by mouth daily.       ampicillin (PRINCIPEN) 500 MG capsule Take 500 mg by mouth 4 (four) times daily. For 22 days       atorvastatin (LIPITOR) 10 MG tablet Take 10 mg by mouth daily.       Calcium Carbonate-Vitamin D (CALCIUM-D PO) Take 1 tablet by mouth daily.        ELIQUIS 2.5 MG TABS tablet TAKE 1 TABLET TWICE DAILY (Patient taking differently: Take 2.5 mg by mouth 2 (two) times daily. ) 180 tablet 1   levothyroxine (SYNTHROID,  LEVOTHROID) 50 MCG tablet Take 50 mcg by mouth daily before breakfast.        metoprolol tartrate (LOPRESSOR) 25 MG tablet Take 1 tablet (25 mg total) by mouth 2 (two) times daily. 180 tablet 3   Multiple Vitamin (MULTI-VITAMINS) TABS Take 1 tablet by mouth daily.        niacin 500 MG CR capsule Take 500 mg by mouth daily.       Omega-3 Fatty Acids (FISH OIL) 1200 MG CAPS Take 1,200 mg by mouth 2 (two) times daily.       oxybutynin (DITROPAN) 5 MG tablet         silver sulfADIAZINE (SILVADENE) 1 % cream Apply 1 application topically daily. To foot ulcers (Patient taking differently: Apply 1 application topically daily as needed (foot ulcers). ) 50 g 0   Wound Dressings (MEDIHONEY WOUND/BURN DRESSING) GEL Apply a small amount to wounds daily 44 mL 1      Drug Regimen Review  Drug regimen was reviewed and remains appropriate with no significant issues identified   Home: Home Living Family/patient expects to be discharged to:: Private residence Living Arrangements: Alone Available Help at Discharge: Family, Available PRN/intermittently Type of Home: House Home Access: Ramped entrance Home Layout: One level Bathroom Shower/Tub: Multimedia programmer: Handicapped height Home Equipment: Environmental consultant - 2 wheels   Functional History: Prior Function Level of Independence: Independent with assistive device(s) Comments: 6wks with walker prior to that was independent, doesn't drive at least 2 months. confused x a week. does have a podiatrist she follows with   Functional Status:  Mobility: Bed Mobility Overal bed mobility: Needs Assistance Bed Mobility: Supine to Sit Rolling: Min assist Sidelying to sit: Mod assist Supine to sit: Min assist General bed mobility comments: increased time and effort, assistance needed for trunk elevation and use of bed pads to assist pt scooting forwards to EOB Transfers Overall transfer level: Needs assistance Equipment used: Rolling walker (2  wheeled) Transfer via Lift Equipment: Stedy Transfers: Sit to/from Stand Sit to Stand: Mod assist, Min assist, +2 physical assistance Stand pivot transfers: Total assist General transfer comment: cueing for safe hand placement; pt stood from EOB x1 with mod A x2 and then from recliner chair x1 with min A x2 Ambulation/Gait Ambulation/Gait assistance: Min assist, +2 physical assistance, +2 safety/equipment(close chair follow) Gait Distance (Feet): 50 Feet(50' x2 with sitting rest break) Assistive device: Rolling walker (2 wheeled) Gait Pattern/deviations: Step-through pattern, Trunk flexed, Shuffle, Decreased stride length General Gait Details: frequent cueing to maintain proximity to RW; assistance for  stability Gait velocity: decreased   ADL: ADL Overall ADL's : Needs assistance/impaired Eating/Feeding: Set up, Sitting Grooming: Oral care, Standing, Min guard Grooming Details (indicate cue type and reason): pt set up her toothbrush in sitting and stood to brush teeth Upper Body Bathing: Moderate assistance Lower Body Bathing: Total assistance, Bed level Upper Body Dressing : Minimal assistance, Sitting Upper Body Dressing Details (indicate cue type and reason): front opening gown Lower Body Dressing: Total assistance Toilet Transfer: Moderate assistance, RW Toilet Transfer Details (indicate cue type and reason): simulated to chair with stedy Toileting- Clothing Manipulation and Hygiene: Total assistance, Sit to/from stand Functional mobility during ADLs: Minimal assistance, Rolling walker, +2 for safety/equipment General ADL Comments: Pt sat EOB to wash face. While sitting EOB, she states, "this is a bunch of crap." She initially was agreeable to brushing teeth once in chair. However, after she had transferred to chair she stated, "I'm not brushing teeth. I don't have any teeth." Therapist informed her that she does have teeth but pt continued to decline.     Cognition: Cognition Overall Cognitive Status: Impaired/Different from baseline Orientation Level: Oriented to person, Oriented to place, Oriented to situation, Disoriented to time Cognition Arousal/Alertness: Awake/alert Behavior During Therapy: WFL for tasks assessed/performed Overall Cognitive Status: Impaired/Different from baseline Area of Impairment: Memory, Problem solving Orientation Level: Disoriented to, Time Current Attention Level: Sustained Memory: Decreased short-term memory Following Commands: Follows one step commands with increased time, Follows multi-step commands inconsistently Safety/Judgement: Decreased awareness of safety, Decreased awareness of deficits Problem Solving: Slow processing General Comments: Following commands with improved processing speed.     Blood pressure (!) 149/63, pulse 64, temperature (!) 97.4 F (36.3 C), temperature source Oral, resp. rate 14, height 5\' 6"  (1.676 m), weight 87.3 kg, SpO2 93 %. Physical Exam  Constitutional: No distress.  HENT:  Head: Normocephalic and atraumatic.  Eyes: Pupils are equal, round, and reactive to light. Left eye exhibits no discharge.  Neck: Normal range of motion.  Cardiovascular: Normal rate. Exam reveals no friction rub.  No murmur heard. Respiratory: Effort normal. No respiratory distress. She has no wheezes.  GI: Soft. She exhibits no distension. There is no abdominal tenderness.  Musculoskeletal:        General: No tenderness. Edema: 1+ pedal edema.  Neurological:  Cooperative. Distracted at times. Fair insight and awareness. UE 4/5 prox to distal. LE: 3/5 HF, KE and 4/5 ADF/PF. Senses pain and LT in all 4's.   Skin: Skin is warm. She is not diaphoretic.  Psychiatric: She has a normal mood and affect. Her behavior is normal.      Lab Results Last 48 Hours        Results for orders placed or performed during the hospital encounter of 09/06/18 (from the past 48 hour(s))  Glucose, capillary      Status: Abnormal    Collection Time: 09/17/18  4:07 PM  Result Value Ref Range    Glucose-Capillary 116 (H) 70 - 99 mg/dL  Glucose, capillary     Status: Abnormal    Collection Time: 09/17/18  8:01 PM  Result Value Ref Range    Glucose-Capillary 187 (H) 70 - 99 mg/dL  Glucose, capillary     Status: Abnormal    Collection Time: 09/18/18 12:13 AM  Result Value Ref Range    Glucose-Capillary 159 (H) 70 - 99 mg/dL  Glucose, capillary     Status: Abnormal    Collection Time: 09/18/18  5:20 AM  Result Value Ref Range  Glucose-Capillary 109 (H) 70 - 99 mg/dL  CBC with Differential/Platelet     Status: Abnormal    Collection Time: 09/18/18  5:22 AM  Result Value Ref Range    WBC 6.6 4.0 - 10.5 K/uL    RBC 2.70 (L) 3.87 - 5.11 MIL/uL    Hemoglobin 8.1 (L) 12.0 - 15.0 g/dL    HCT 25.5 (L) 36.0 - 46.0 %    MCV 94.4 80.0 - 100.0 fL    MCH 30.0 26.0 - 34.0 pg    MCHC 31.8 30.0 - 36.0 g/dL    RDW 17.3 (H) 11.5 - 15.5 %    Platelets 177 150 - 400 K/uL    nRBC 0.0 0.0 - 0.2 %    Neutrophils Relative % 76 %    Neutro Abs 5.1 1.7 - 7.7 K/uL    Lymphocytes Relative 16 %    Lymphs Abs 1.0 0.7 - 4.0 K/uL    Monocytes Relative 6 %    Monocytes Absolute 0.4 0.1 - 1.0 K/uL    Eosinophils Relative 0 %    Eosinophils Absolute 0.0 0.0 - 0.5 K/uL    Basophils Relative 1 %    Basophils Absolute 0.0 0.0 - 0.1 K/uL    Immature Granulocytes 1 %    Abs Immature Granulocytes 0.09 (H) 0.00 - 0.07 K/uL      Comment: Performed at [facility], [address] [place], [place] [address]  Comprehensive metabolic panel     Status: Abnormal    Collection Time: 09/18/18  5:22 AM  Result Value Ref Range    Sodium 139 135 - 145 mmol/L    Potassium 4.0 3.5 - 5.1 mmol/L    Chloride 105 98 - 111 mmol/L    CO2 26 22 - 32 mmol/L    Glucose, Bld 114 (H) 70 - 99 mg/dL    BUN 32 (H) 8 - 23 mg/dL    Creatinine, Ser 1.49 (H) 0.44 - 1.00 mg/dL    Calcium 8.8 (L) 8.9 - 10.3 mg/dL    Total Protein 5.0 (L) 6.5 -  8.1 g/dL    Albumin 2.0 (L) 3.5 - 5.0 g/dL    AST 114 (H) 15 - 41 U/L    ALT 108 (H) 0 - 44 U/L    Alkaline Phosphatase 108 38 - 126 U/L    Total Bilirubin 0.4 0.3 - 1.2 mg/dL    GFR calc non Af Amer 32 (L) >60 mL/min    GFR calc Af Amer 37 (L) >60 mL/min    Anion gap 8 5 - 15      Comment: Performed at [facility], [place] [address], [place], [place] [address]  Glucose, capillary     Status: Abnormal    Collection Time: 09/18/18  8:14 AM  Result Value Ref Range    Glucose-Capillary 111 (H) 70 - 99 mg/dL  Glucose, capillary     Status: Abnormal    Collection Time: 09/18/18 11:23 AM  Result Value Ref Range    Glucose-Capillary 118 (H) 70 - 99 mg/dL  Glucose, capillary     Status: Abnormal    Collection Time: 09/18/18  4:16 PM  Result Value Ref Range    Glucose-Capillary 111 (H) 70 - 99 mg/dL  Glucose, capillary     Status: Abnormal    Collection Time: 09/18/18  7:51 PM  Result Value Ref Range    Glucose-Capillary 174 (H) 70 - 99 mg/dL  Glucose, capillary  Status: Abnormal    Collection Time: 09/19/18 12:06 AM  Result Value Ref Range    Glucose-Capillary 127 (H) 70 - 99 mg/dL  Glucose, capillary     Status: Abnormal    Collection Time: 09/19/18  3:49 AM  Result Value Ref Range    Glucose-Capillary 109 (H) 70 - 99 mg/dL  CBC with Differential/Platelet     Status: Abnormal    Collection Time: 09/19/18  5:09 AM  Result Value Ref Range    WBC 5.3 4.0 - 10.5 K/uL    RBC 2.56 (L) 3.87 - 5.11 MIL/uL    Hemoglobin 7.6 (L) 12.0 - 15.0 g/dL    HCT 24.3 (L) 36.0 - 46.0 %    MCV 94.9 80.0 - 100.0 fL    MCH 29.7 26.0 - 34.0 pg    MCHC 31.3 30.0 - 36.0 g/dL    RDW 17.5 (H) 11.5 - 15.5 %    Platelets 188 150 - 400 K/uL    nRBC 0.0 0.0 - 0.2 %    Neutrophils Relative % 74 %    Neutro Abs 3.9 1.7 - 7.7 K/uL    Lymphocytes Relative 19 %    Lymphs Abs 1.0 0.7 - 4.0 K/uL    Monocytes Relative 6 %    Monocytes Absolute 0.3 0.1 - 1.0 K/uL    Eosinophils Relative 0 %     Eosinophils Absolute 0.0 0.0 - 0.5 K/uL    Basophils Relative 0 %    Basophils Absolute 0.0 0.0 - 0.1 K/uL    Immature Granulocytes 1 %    Abs Immature Granulocytes 0.06 0.00 - 0.07 K/uL      Comment: Performed at [facility], [address], [place], [place] [address]  Comprehensive metabolic panel     Status: Abnormal    Collection Time: 09/19/18  5:09 AM  Result Value Ref Range    Sodium 139 135 - 145 mmol/L    Potassium 3.8 3.5 - 5.1 mmol/L    Chloride 105 98 - 111 mmol/L    CO2 27 22 - 32 mmol/L    Glucose, Bld 117 (H) 70 - 99 mg/dL    BUN 29 (H) 8 - 23 mg/dL    Creatinine, Ser 1.52 (H) 0.44 - 1.00 mg/dL    Calcium 8.5 (L) 8.9 - 10.3 mg/dL    Total Protein 4.9 (L) 6.5 - 8.1 g/dL    Albumin 1.9 (L) 3.5 - 5.0 g/dL    AST 88 (H) 15 - 41 U/L    ALT 92 (H) 0 - 44 U/L    Alkaline Phosphatase 103 38 - 126 U/L    Total Bilirubin 0.5 0.3 - 1.2 mg/dL    GFR calc non Af Amer 31 (L) >60 mL/min    GFR calc Af Amer 36 (L) >60 mL/min    Anion gap 7 5 - 15      Comment: Performed at [facility], [place] [address], [place], [place] [address]  Glucose, capillary     Status: Abnormal    Collection Time: 09/19/18  7:54 AM  Result Value Ref Range    Glucose-Capillary 105 (H) 70 - 99 mg/dL  Glucose, capillary     Status: Abnormal    Collection Time: 09/19/18 11:52 AM  Result Value Ref Range    Glucose-Capillary 113 (H) 70 - 99 mg/dL      Imaging Results (Last 48 hours)  No results found.  Medical Problem List and Plan: 1.  Functional deficits and weakness secondary to debility and encephalopathy after urosepsis/pancreatitis and multiple medical issues  -admit to inpatient rehab 2.  Antithrombotics: -PAF/DVT/anticoagulation:  Pharmaceutical: Other (comment)- Eliquis             -antiplatelet therapy:  3. Pain Management: Tylenol prn.  4. Mood: LCSW to follow for evaluation and support.              -antipsychotic agents: N/A 5. Neuropsych: This patient is  capable of making decisions on her own behalf. 6. Skin/Wound Care: Routine pressure relief measures.  7. Fluids/Electrolytes/Nutrition: Strict I/O. Check lytes in am.  8. Enterococcus bacteremia/UTI: Continue Maxipime D# 12/14.  9. Anasarca/FLuid overload: Add heart healthy restrictions. Weight daily. Lasix daily prn depending stability of weights/symptoms.  10. PAF: Monitor HR bid--back on amiodarone and Eliquis. Off metoprolol due to bradycardia.              -HR in 60's today 11. Urinary retention: Monitor voiding with PVR checks. Cath for volumes > 350 cc.             -OOB to void 12. Acute on Chronic renal failure:  SCr- 1.32 on 04/2018. Continue to monitor with routine checks.  Has improved from 50/3.24---> 29/1.25 13. Delirium:  Resolving.  14. Anemia of chronic disease: Baseline Hg around 9.0? Likely drop after hydration.         Monitor for signs of bleeding. None at present 15. Acute pancreatitis:  Po intake remains poor 0- 20% per records.  Offer supplements with meals.  16. Hypothyroidism: Now synthroid 75 mcg/day.     Post Admission Physician Evaluation: 1. Functional deficits secondary  to Debility . 2. Patient is admitted to receive collaborative, interdisciplinary care between the physiatrist, rehab nursing staff, and therapy team. 3. Patient's level of medical complexity and substantial therapy needs in context of that medical necessity cannot be provided at a lesser intensity of care such as a SNF. 4. Patient has experienced substantial functional loss from his/her baseline which was documented above under the "Functional History" and "Functional Status" headings.  Judging by the patient's diagnosis, physical exam, and functional history, the patient has potential for functional progress which will result in measurable gains while on inpatient rehab.  These gains will be of substantial and practical use upon discharge  in facilitating mobility and self-care at the household  level. 5. Physiatrist will provide 24 hour management of medical needs as well as oversight of the therapy plan/treatment and provide guidance as appropriate regarding the interaction of the two. 6. The Preadmission Screening has been reviewed and patient status is unchanged unless otherwise stated above. 7. 24 hour rehab nursing will assist with bladder management, bowel management, safety, skin/wound care, disease management, medication administration, pain management and patient education  and help integrate therapy concepts, techniques,education, etc. 8. PT will assess and treat for/with: Lower extremity strength, range of motion, stamina, balance, functional mobility, safety, adaptive techniques and equipment, community reentry.   Goals are: mod I to supervision. 9. OT will assess and treat for/with: ADL's, functional mobility, safety, upper extremity strength, adaptive techniques and equipment, NMR, community reentry.   Goals are: min assist to supervision. Therapy may proceed with showering this patient. 10. SLP will assess and treat for/with: cognition.  Goals are: mod I to supervision. 11. Case Management and Social Worker will assess and treat for psychological issues and discharge planning. 12. Team conference will be held weekly to assess  progress toward goals and to determine barriers to discharge. 13. Patient will receive at least 3 hours of therapy per day at least 5 days per week. 14. ELOS: 10-14 days       15. Prognosis:  excellent   I have personally performed a face to face diagnostic evaluation of this patient and formulated the key components of the plan.  Additionally, I have personally reviewed laboratory data, imaging studies, as well as relevant notes and concur with the physician assistant's documentation above.  Meredith Staggers, MD, [place], PA-C 09/19/2018

## 2018-09-19 NOTE — Progress Notes (Signed)
Meredith Staggers, MD  Physician  Physical Medicine and Rehabilitation  PMR Pre-admission  Signed  Date of Service:  09/19/2018 1:23 PM       Related encounter: ED to Hosp-Admission (Discharged) from 09/06/2018 in [facility]      Signed         Show:Clear all '[x]' Manual'[x]' Template'[x]' Copied  Added by: '[x]' Julious Payer Vertis Kelch, RN'[x]' Meredith Staggers, MD  '[]' Hover for details PMR Admission Coordinator Pre-Admission Assessment  Patient: Maureen Stout is an 85 y.o., female MRN: [id number] DOB: 1931/08/16 Height: '5\' 6"'  (167.6 cm) Weight: 87.3 kg  Insurance Information HMO:     PPO:      PCP: yes     IPA:      80/20:      OTHER: medicare advantage plan PRIMARY: [employer]      Policy#: [id number]      Subscriber: pt CM Name:       Phone#: [phone]     Fax#:  Pre-Cert#: [id number] approved automated due to COVID crisis. Enis Gash to f/u in 7 days [phone] ext [phone]      Employer:  Benefits:  Phone #: [phone]     Name:  Eff. Date: 05/18/18     Deduct: none      Out of Pocket Max: %6700      Life Max: none CIR: $450 co pay per day days 1 until 4      SNF: no co pay days 1 until 20; $178 co pay per day days 21 until 100 Outpatient: $20 co pay      Co-Pay: visits per medical neccesity Home Health: 100%      Co-Pay: visits per medical neccesity DME: 80%     Co-Pay: 20% Providers: in network  SECONDARY: guarantee trust life      Policy#: [id number]      Subscriber: pt  Medicaid Application Date:       Case Manager:  Disability Application Date:       Case Worker:   The "Data Collection Information Summary" for patients in Inpatient Rehabilitation Facilities with attached "Privacy Act [place] Records" was provided and verbally reviewed with: Patient and Family  Emergency Contact Information         Contact Information    Name Relation Home Work Mobile   Lofaro,[name] Son [phone]  [phone]      Current Medical  History  Patient Admitting Diagnosis: debility; urosepsis  History of Present Illness: [name] is an 85 year old female with history of T2DM, PAF, CKD, entero vesicular fistula, sinus bradycardiawho was admitted on 09/06/2018 with 1 week history of dizziness and weakness.She was found to have marked bradycardia with heart rate in 30s,acute on chronic renal failure as well as elevated TSH-9.2. Dr. Martinique recommended discontinuation of amiodarone and metoprolol and AKItreated with IV fluids.She developed hypotension and abdominal pain due to urosepsis and was started on IV cefepime for treatment. CT abdomen revealed acute pancreatitis and treated with NPO and D5NS.She has had issues with confusion and agitation due to delirium. Mentation improved briefly but she developed lethargy with hypothermia felt to be due to sepsis.   As bradycardia resolved with amiodarone was resumed on 4/24.Acute on chronic anemia treated with 1 unit packed red blood cells and thrombocytopenia being monitored.Follow up CT abdomen 4/27,showed no change in mild acute pancreatitis with increase in diffuse body wall edema with small to moderate bilateral pleural effusions. Anasarca monitored initially and she required transfusion with additional  unit of PRBC 4/8 due to drop in H/H to 6.7/21.6. She continues to have intermittent hypothermia and mentation slowly improving. She continues to require I/O caths for urinary retention. Weights on upward trend to 198 lbs therefore treated with IV lasix 5/3 and 5/4.   Patient's medical record from [facility] has been reviewed by the rehabilitation admission coordinator and physician.  Past Medical History      Past Medical History:  Diagnosis Date  . Acute on chronic anemia 09/07/2018  . Bladder problem   . CKD (chronic kidney disease), stage III ([place])   . Diabetes mellitus ([place])   . Dyslipidemia   . Hypertension   . Paroxysmal A-fib ([place])    . Paroxysmal atrial flutter ([place])   . Sinus bradycardia     Family History   family history includes Breast cancer in her sister; Cancer in her father.  Prior Rehab/Hospitalizations Has the patient had prior rehab or hospitalizations prior to admission? Yes  Has the patient had major surgery during 100 days prior to admission? No              Current Medications  Current Facility-Administered Medications:  .  0.9 %  sodium chloride infusion (Manually program via Guardrails IV Fluids), , Intravenous, Once, Lorella Nimrod, MD .  acetaminophen (TYLENOL) tablet 650 mg, 650 mg, Oral, Q6H PRN, 650 mg at 09/07/18 0526 **OR** acetaminophen (TYLENOL) suppository 650 mg, 650 mg, Rectal, Q6H PRN, Chundi, Vahini, MD .  amiodarone (PACERONE) tablet 100 mg, 100 mg, Oral, Daily, Lelon Perla, MD, 100 mg at 09/19/18 0930 .  apixaban (ELIQUIS) tablet 2.5 mg, 2.5 mg, Oral, BID, Katherine Roan, MD, 2.5 mg at 09/19/18 0930 .  atorvastatin (LIPITOR) tablet 10 mg, 10 mg, Oral, Daily, Chundi, Vahini, MD, 10 mg at 09/19/18 0930 .  calcium carbonate (TUMS - dosed in mg elemental calcium) chewable tablet 200-400 mg of elemental calcium, 1-2 tablet, Oral, Q6H PRN, Dorrell, Andree Elk, MD, 400 mg of elemental calcium at 09/06/18 2322 .  calcium-vitamin D (OSCAL WITH D) 500-200 MG-UNIT per tablet, , Oral, BID, Chundi, Vahini, MD, 1 tablet at 09/19/18 0930 .  ceFEPIme (MAXIPIME) 2 g in sodium chloride 0.9 % 100 mL IVPB, 2 g, Intravenous, Q12H, Karren Cobble, RPH, Last Rate: 200 mL/hr at 09/19/18 1306, 2 g at 09/19/18 1306 .  feeding supplement (ENSURE ENLIVE) (ENSURE ENLIVE) liquid 237 mL, 237 mL, Oral, BID BM, Chundi, Vahini, MD, 237 mL at 09/17/18 1530 .  levothyroxine (SYNTHROID) tablet 75 mcg, 75 mcg, Oral, QAC breakfast, Lucious Groves, DO, 75 mcg at 09/19/18 0510 .  polyethylene glycol (MIRALAX / GLYCOLAX) packet 17 g, 17 g, Oral, Daily, Lorella Nimrod, MD, 17 g at 09/19/18 0931 .   promethazine (PHENERGAN) tablet 12.5 mg, 12.5 mg, Oral, Q6H PRN, Chundi, Vahini, MD .  ramelteon (ROZEREM) tablet 8 mg, 8 mg, Oral, QHS, Katherine Roan, MD, 8 mg at 09/18/18 2134 .  senna-docusate (Senokot-S) tablet 1 tablet, 1 tablet, Oral, QHS PRN, Chundi, Vahini, MD, 1 tablet at 09/06/18 2321 .  sodium chloride flush (NS) 0.9 % injection 10-40 mL, 10-40 mL, Intracatheter, PRN, Lucious Groves, DO, 10 mL at 09/16/18 2114  Patients Current Diet:     Diet Order                  DIET SOFT Room service appropriate? No; Fluid consistency: Thin  Diet effective now  Precautions / Restrictions Precautions Precautions: Fall Precaution Comments: picks at skin and lines, multiple UE lacerations Restrictions Weight Bearing Restrictions: No   Has the patient had 2 or more falls or a fall with injury in the past year? No  Prior Activity Level Limited Community (1-2x/wk): Mod I RW; did not drive  Prior Functional Level Self Care: Did the patient need help bathing, dressing, using the toilet or eating? Independent  Indoor Mobility: Did the patient need assistance with walking from room to room (with or without device)? Independent  Stairs: Did the patient need assistance with internal or external stairs (with or without device)? Needed some help  Functional Cognition: Did the patient need help planning regular tasks such as shopping or remembering to take medications? Needed some help  Home Assistive Devices / [place] Devices/Equipment: Gilford Rile (specify type), Grab bars in shower Home Equipment: Walker - 2 wheels  Prior Device Use: Indicate devices/aids used by the patient prior to current illness, exacerbation or injury? Walker  Current Functional Level Cognition  Overall Cognitive Status: Impaired/Different from baseline Current Attention Level: Sustained Orientation Level: Oriented to person, Oriented to place, Oriented to  situation, Disoriented to time Following Commands: Follows one step commands with increased time, Follows multi-step commands inconsistently Safety/Judgement: Decreased awareness of safety, Decreased awareness of deficits General Comments: Following commands with improved processing speed.    Extremity Assessment (includes Sensation/Coordination)  Upper Extremity Assessment: Generalized weakness  Lower Extremity Assessment: Defer to PT evaluation    ADLs  Overall ADL's : Needs assistance/impaired Eating/Feeding: Set up, Sitting Grooming: Oral care, Standing, Min guard Grooming Details (indicate cue type and reason): pt set up her toothbrush in sitting and stood to brush teeth Upper Body Bathing: Moderate assistance Lower Body Bathing: Total assistance, Bed level Upper Body Dressing : Minimal assistance, Sitting Upper Body Dressing Details (indicate cue type and reason): front opening gown Lower Body Dressing: Total assistance Toilet Transfer: Moderate assistance, RW Toilet Transfer Details (indicate cue type and reason): simulated to chair with stedy Toileting- Clothing Manipulation and Hygiene: Total assistance, Sit to/from stand Functional mobility during ADLs: Minimal assistance, Rolling walker, +2 for safety/equipment General ADL Comments: Pt sat EOB to wash face. While sitting EOB, she states, "this is a bunch of crap." She initially was agreeable to brushing teeth once in chair. However, after she had transferred to chair she stated, "I'm not brushing teeth. I don't have any teeth." Therapist informed her that she does have teeth but pt continued to decline.     Mobility  Overal bed mobility: Needs Assistance Bed Mobility: Supine to Sit Rolling: Min assist Sidelying to sit: Mod assist Supine to sit: Min assist General bed mobility comments: increased time and effort, assistance needed for trunk elevation and use of bed pads to assist pt scooting forwards to EOB     Transfers  Overall transfer level: Needs assistance Equipment used: Rolling walker (2 wheeled) Transfer via Lift Equipment: Stedy Transfers: Sit to/from Stand Sit to Stand: Mod assist, Min assist, +2 physical assistance Stand pivot transfers: Total assist General transfer comment: cueing for safe hand placement; pt stood from EOB x1 with mod A x2 and then from recliner chair x1 with min A x2    Ambulation / Gait / Stairs / Wheelchair Mobility  Ambulation/Gait Ambulation/Gait assistance: Min assist, +2 physical assistance, +2 safety/equipment(close chair follow) Gait Distance (Feet): 50 Feet(50' x2 with sitting rest break) Assistive device: Rolling walker (2 wheeled) Gait Pattern/deviations: Step-through pattern, Trunk flexed, Shuffle, Decreased  stride length General Gait Details: frequent cueing to maintain proximity to RW; assistance for stability Gait velocity: decreased    Posture / Balance Balance Overall balance assessment: Needs assistance Sitting-balance support: No upper extremity supported, Feet supported Sitting balance-Leahy Scale: Fair Standing balance support: Bilateral upper extremity supported Standing balance-Leahy Scale: Poor Standing balance comment: bilateral UE support of RW    Special needs/care consideration BiPAP/CPAP  N/a CPM  N/a Continuous Drip IV  N/a Dialysis  N/a Life Vest  N/a Oxygen  N/a Special Bed  N/a Trach Size  N/a Wound Vac n/a Skin  Stage 2 to right buttocks; edema to Bilateral LE; right and left foot with dry, black area; skin tear to right arm; ecchymosis to abdomen Bowel mgmt:  LBM 5/3 continent Bladder mgmt:  External catheter with some I and os Diabetic mgmt:  N/a Behavioral consideration  N/a Chemo/radiation  N/a   Previous Home Environment  Living Arrangements: Alone  Lives With: Alone Available Help at Discharge: Family, Available 24 hours/day(son, dtr in law and brother to provide at d/c) Type of Home: House Home  Layout: One level Home Access: Ramped entrance Bathroom Shower/Tub: Walk-in Radio producer: Handicapped height Bathroom Accessibility: Yes How Accessible: Accessible via walker Home Care Services: No  Discharge Living Setting Plans for Discharge Living Setting: Patient's home, Alone Type of Home at Discharge: House Discharge Home Layout: One level Discharge Home Access: Ramped entrance Discharge Bathroom Shower/Tub: Walk-in shower Discharge Bathroom Toilet: Handicapped height Does the patient have any problems obtaining your medications?: No  Social/Family/Support Systems Patient Roles: Parent Contact Information: son and his wife, Vicente Serene and Retail buyer Anticipated Caregiver: son, daughter in Sports coach, brother and sister in law Anticipated Caregiver's Contact Information: see above Ability/Limitations of Caregiver: none Caregiver Availability: 24/7 Discharge Plan Discussed with Primary Caregiver: Yes Is Caregiver In Agreement with Plan?: Yes Does Caregiver/Family have Issues with Lodging/Transportation while Pt is in Rehab?: Yes  Goals/Additional Needs Patient/Family Goal for Rehab: Mod I to supervision PT, superivsion to min OT, Mod I to supervision SLP Expected length of stay: ELOS 10 to 14 days Pt/Family Agrees to Admission and willing to participate: Yes Program Orientation Provided & Reviewed with Pt/Caregiver Including Roles  & Responsibilities: Yes  Decrease burden of Care through IP rehab admission: n/a  Possible need for SNF placement upon discharge:  Not anticipated  Patient Condition: I have reviewed medical records from [facility] , spoken with CM, and patient and son. I met with patient at the bedside for inpatient rehabilitation assessment.  Patient will benefit from ongoing PT, OT and SLP, can actively participate in 3 hours of therapy a day 5 days of the week, and can make measurable gains during the admission.  Patient will also benefit from the  coordinated team approach during an Inpatient Acute Rehabilitation admission.  The patient will receive intensive therapy as well as Rehabilitation physician, nursing, social worker, and care management interventions.  Due to bladder management, bowel management, safety, skin/wound care, disease management, medication administration and patient education the patient requires 24 hour a day rehabilitation nursing.  The patient is currently mod assist with mobility and basic ADLs.  Discharge setting and therapy post discharge at home with home health is anticipated.  Patient has agreed to participate in the Acute Inpatient Rehabilitation Program and will admit today.  Preadmission Screen Completed By:  Cleatrice Burke RN MSN 09/19/2018 1:34 PM ______________________________________________________________________   Discussed status with Dr. Naaman Plummer  on  09/19/2018 at 1332 and received  approval for admission today.  Admission Coordinator:  Cleatrice Burke, RN MSN time 3200 Date 09/19/2018   Assessment/Plan: Diagnosis: debility and encephalopathy after urosepsis 1. Does the need for close, 24 hr/ 2. day Medical supervision in concert with the patient's rehab needs make it unreasonable for this patient to be served in a less intensive setting? Yes 3. Co-Morbidities requiring supervision/potential complications: DM2, PAF, CKD 4. Due to bladder management, bowel management, safety, skin/wound care, disease management, medication administration, pain management and patient education, does the patient require 24 hr/day rehab nursing? Yes 5. Does the patient require coordinated care of a physician, rehab nurse, PT (1-2 hrs/day, 5 days/week), OT (1-2 hrs/day, 5 days/week) and SLP (1-2 hrs/day, 5 days/week) to address physical and functional deficits in the context of the above medical diagnosis(es)? Yes Addressing deficits in the following areas: balance, endurance, locomotion, strength, transferring,  bowel/bladder control, bathing, dressing, feeding, grooming, toileting, cognition and psychosocial support 6. Can the patient actively participate in an intensive therapy program of at least 3 hrs of therapy 5 days a week? Yes 7. The potential for patient to make measurable gains while on inpatient rehab is excellent 8. Anticipated functional outcomes upon discharge from inpatients are: modified independent and supervision PT, supervision and min assist OT, modified independent and supervision SLP 9. Estimated rehab length of stay to reach the above functional goals is: 10-14 days 10. Anticipated D/C setting: Home 11. Anticipated post D/C treatments: [place] therapy 12. Overall Rehab/Functional Prognosis: excellent  MD Signature: Meredith Staggers, MD, [facility] 09/19/2018         Revision History

## 2018-09-19 NOTE — Progress Notes (Signed)
Physical Therapy Treatment Patient Details Name: Maureen Stout MRN: [id number] DOB: [birth year] Today's Date: 09/19/2018    History of Present Illness Pt is a 85 y/o admitted with dizziness and weakness with several falls. Pt with bradycardia, aV block, urosepsis. PMhx: DM, Afib, HTN, bradycardia    PT Comments    Pt was getting to side of bed with nursing to use BSC on entry. Pt continues to be limited in safe mobility by dizziness, decreased strength and endurance. Pt requires min A for bed mobility, modA for transfers and min A for ambulation with very close chair follow as patient reports feeling very weak. Pt is d/cing to CIR this afternoon. Pt is unsure this is right decision. Pt encouraged by OT/PT about CIR.    Follow Up Recommendations  CIR;Supervision/Assistance - 24 hour        Recommendations for Other Services Rehab consult     Precautions / Restrictions Precautions Precautions: Fall Restrictions Weight Bearing Restrictions: No    Mobility  Bed Mobility Overal bed mobility: Needs Assistance Bed Mobility: Sit to Supine       Sit to supine: Min guard   General bed mobility comments: returned to bed for bladder scan  Transfers Overall transfer level: Needs assistance Equipment used: Rolling walker (2 wheeled) Transfers: Sit to/from [facility] Sit to Stand: Min assist;Mod assist;+2 safety/equipment Stand pivot transfers: Mod assist;+2 safety/equipment       General transfer comment: multimodal cues for hand placment with RW, assist to rise and steady and to move walker  Ambulation/Gait Ambulation/Gait assistance: Min assist;+2 safety/equipment(very close chair follow) Gait Distance (Feet): 15 Feet(2x15) Assistive device: Rolling walker (2 wheeled) Gait Pattern/deviations: Step-through pattern;Trunk flexed;Shuffle;Decreased stride length Gait velocity: decreased Gait velocity interpretation: <1.31 ft/sec, indicative of household  ambulator General Gait Details: frequent cueing to maintain proximity to RW; assistance for stability         Balance Overall balance assessment: Needs assistance   Sitting balance-Leahy Scale: Fair     Standing balance support: Bilateral upper extremity supported Standing balance-Leahy Scale: Poor Standing balance comment: bilateral UE support of RW                            Cognition Arousal/Alertness: Awake/alert Behavior During Therapy: Flat affect Overall Cognitive Status: Impaired/Different from baseline Area of Impairment: Memory;Following commands;Safety/judgement;Problem solving;Orientation                 Orientation Level: Disoriented to;Time Current Attention Level: Sustained Memory: Decreased short-term memory Following Commands: Follows one step commands with increased time(and multimodal cues) Safety/Judgement: Decreased awareness of safety;Decreased awareness of deficits   Problem Solving: Slow processing;Decreased initiation;Difficulty sequencing;Requires verbal cues;Requires tactile cues General Comments: pt with difficulty generalizing sequence for sit to stand with RW      Exercises      General Comments General comments (skin integrity, edema, etc.): Pt with complaints of dizziness despite SaO2 >90% and BP WNL       Pertinent Vitals/Pain Pain Assessment: No/denies pain    Home Living     Available Help at Discharge: Family;Available 24 hours/day(son, dtr in law and brother to provide at d/c)                    PT Goals (current goals can now be found in the care plan section) Acute Rehab PT Goals Patient Stated Goal: return home PT Goal Formulation: With patient Time For Goal Achievement: 09/23/18 Potential  to Achieve Goals: Fair Progress towards PT goals: Progressing toward goals    Frequency    Min 3X/week      PT Plan Current plan remains appropriate    Co-evaluation PT/OT/SLP  Co-Evaluation/Treatment: Yes Reason for Co-Treatment: For patient/therapist safety PT goals addressed during session: Mobility/safety with mobility;Balance;Proper use of DME OT goals addressed during session: ADL's and self-care;Strengthening/ROM;Proper use of Adaptive equipment and DME      AM-PAC PT "6 Clicks" Mobility   Outcome Measure  Help needed turning from your back to your side while in a flat bed without using bedrails?: A Little Help needed moving from lying on your back to sitting on the side of a flat bed without using bedrails?: A Little Help needed moving to and from a bed to a chair (including a wheelchair)?: A Lot Help needed standing up from a chair using your arms (e.g., wheelchair or bedside chair)?: A Little Help needed to walk in hospital room?: A Lot Help needed climbing 3-5 steps with a railing? : Total 6 Click Score: 14    End of Session Equipment Utilized During Treatment: Gait belt Activity Tolerance: Patient tolerated treatment well Patient left: in bed;with call bell/phone within reach;with nursing/sitter in room Nurse Communication: Mobility status PT Visit Diagnosis: Other abnormalities of gait and mobility (R26.89);Muscle weakness (generalized) (M62.81);Difficulty in walking, not elsewhere classified (R26.2)     Time: 7412-8786 PT Time Calculation (min) (ACUTE ONLY): 26 min  Charges:  $Gait Training: 8-22 mins                     Galit Urich B. Migdalia Dk PT, DPT Acute Rehabilitation Services Pager [phone] Office [phone]    [place] 09/19/2018, 4:30 PM

## 2018-09-19 NOTE — Progress Notes (Signed)
Inpatient Rehabilitation Admissions Coordinator  I met with patient at beside. She has progressed well with therapy over the weekend. I have placed calls to pt's son and daughter in law to discuss a possible inpt rehab admission today. I will follow up once family returns my calls.  Danne Baxter, RN, MSN Rehab Admissions Coordinator [phone] 09/19/2018 12:03 PM

## 2018-09-19 NOTE — H&P (Signed)
Physical Medicine and Rehabilitation Admission H&P    Chief Complaint  Patient presents with  . Weakness due to debility and sepsis  . Decline in functional status.    HPI:  Maureen Stout is an 85 year old female with history of T2DM, PAF, CKD, enterovesicular fistula, sinus bradycardia who was admitted on 09/06/2018 with 1 week history of dizziness and weakness.  She was found to have marked bradycardia with heart rate in 30s, acute on chronic renal failure as well as elevated TSH-9.2.  Dr. [place] recommended discontinuation of amiodarone and metoprolol and AKI treated with IV fluids.  She developed hypotension and abdominal pain due to urosepsis and was started on IV cefepime for treatment.  CT abdomen revealed acute pancreatitis and treated with NPO and D5NS.   She has had issues with confusion and agitation due to delirium.  Mentation improved briefly but she developed lethargy with hypothermia felt to be due to sepsis.   As bradycardia resolved with amiodarone was resumed on 4/24.  Acute on chronic anemia treated with 1 unit packed red blood cells and thrombocytopenia being monitored. Follow up CT abdomen 4/27,showed no change in mild acute pancreatitis with increase in diffuse body wall edema with small to moderate bilateral pleural effusions. Anasarca  monitored initially and she required transfusion with additional unit of  PRBC 4/8  due to drop in H/H to 6.7/21.6.  She continues to have intermittent hypothermia and mentation slowly improving. She continues to require  I/O caths for urinary retention. Weights on upward trend to 198 lbs therefore treated with IV lasix 5/3 and 5/4.  Therapy ongoing and patient noted to be debilitated with poor safety awareness. CIR recommended due to functional decline.    Review of Systems  Constitutional: Negative for fever.  HENT: Positive for congestion.   Eyes: Negative for blurred vision.  Respiratory: Positive for cough and wheezing.    Cardiovascular: Positive for chest pain.  Gastrointestinal: Negative for nausea and vomiting.  Genitourinary: Negative.        Urine retention, overflow?  Musculoskeletal: Negative for neck pain.  Skin: Negative for itching.  Neurological: Negative for tremors.  Psychiatric/Behavioral: Negative for depression.      Past Medical History:  Diagnosis Date  . Acute on chronic anemia 09/07/2018  . Bladder problem   . CKD (chronic kidney disease), stage III ([place])   . Diabetes mellitus ([place])   . Dyslipidemia   . Hypertension   . Paroxysmal A-fib ([place])   . Paroxysmal atrial flutter ([place])   . Sinus bradycardia     Past Surgical History:  Procedure Laterality Date  . PARTIAL HYSTERECTOMY    . TOE SURGERY    . TONSILLECTOMY      Family History  Problem Relation Age of Onset  . Cancer Father   . Breast cancer Sister   . Diabetes Neg Hx   . Heart attack Neg Hx     Social History:  reports that she has never smoked. She has never used smokeless tobacco. She reports that she does not drink alcohol or use drugs.    Allergies  Allergen Reactions  . Vancomycin Rash    Medications Prior to Admission  Medication Sig Dispense Refill  . allopurinol (ZYLOPRIM) 100 MG tablet Take 100 mg by mouth at bedtime.     Marland Kitchen amiodarone (PACERONE) 200 MG tablet TAKE 1 TABLET EVERY DAY (Patient taking differently: Take 200 mg by mouth daily. ) 90 tablet 1  . amLODipine (NORVASC)  5 MG tablet Take 5 mg by mouth daily.    Marland Kitchen ampicillin (PRINCIPEN) 500 MG capsule Take 500 mg by mouth 4 (four) times daily. For 22 days    . atorvastatin (LIPITOR) 10 MG tablet Take 10 mg by mouth daily.    . Calcium Carbonate-Vitamin D (CALCIUM-D PO) Take 1 tablet by mouth daily.     Marland Kitchen ELIQUIS 2.5 MG TABS tablet TAKE 1 TABLET TWICE DAILY (Patient taking differently: Take 2.5 mg by mouth 2 (two) times daily. ) 180 tablet 1  . levothyroxine (SYNTHROID, LEVOTHROID) 50 MCG tablet Take 50 mcg by mouth daily before breakfast.      . metoprolol tartrate (LOPRESSOR) 25 MG tablet Take 1 tablet (25 mg total) by mouth 2 (two) times daily. 180 tablet 3  . Multiple Vitamin (MULTI-VITAMINS) TABS Take 1 tablet by mouth daily.     . niacin 500 MG CR capsule Take 500 mg by mouth daily.    . Omega-3 Fatty Acids (FISH OIL) 1200 MG CAPS Take 1,200 mg by mouth 2 (two) times daily.    Marland Kitchen oxybutynin (DITROPAN) 5 MG tablet     . silver sulfADIAZINE (SILVADENE) 1 % cream Apply 1 application topically daily. To foot ulcers (Patient taking differently: Apply 1 application topically daily as needed (foot ulcers). ) 50 g 0  . Wound Dressings (MEDIHONEY WOUND/BURN DRESSING) GEL Apply a small amount to wounds daily 44 mL 1    Drug Regimen Review  Drug regimen was reviewed and remains appropriate with no significant issues identified  Home: Home Living Family/patient expects to be discharged to:: Private residence Living Arrangements: Alone Available Help at Discharge: Family, Available PRN/intermittently Type of Home: House Home Access: Ramped entrance Home Layout: One level Bathroom Shower/Tub: Multimedia programmer: Handicapped height Home Equipment: Environmental consultant - 2 wheels   Functional History: Prior Function Level of Independence: Independent with assistive device(s) Comments: 6wks with walker prior to that was independent, doesn't drive at least 2 months. confused x a week. does have a podiatrist she follows with  Functional Status:  Mobility: Bed Mobility Overal bed mobility: Needs Assistance Bed Mobility: Supine to Sit Rolling: Min assist Sidelying to sit: Mod assist Supine to sit: Min assist General bed mobility comments: increased time and effort, assistance needed for trunk elevation and use of bed pads to assist pt scooting forwards to EOB Transfers Overall transfer level: Needs assistance Equipment used: Rolling walker (2 wheeled) Transfer via Lift Equipment: Stedy Transfers: Sit to/from Stand Sit to Stand:  Mod assist, Min assist, +2 physical assistance Stand pivot transfers: Total assist General transfer comment: cueing for safe hand placement; pt stood from EOB x1 with mod A x2 and then from recliner chair x1 with min A x2 Ambulation/Gait Ambulation/Gait assistance: Min assist, +2 physical assistance, +2 safety/equipment(close chair follow) Gait Distance (Feet): 50 Feet(50' x2 with sitting rest break) Assistive device: Rolling walker (2 wheeled) Gait Pattern/deviations: Step-through pattern, Trunk flexed, Shuffle, Decreased stride length General Gait Details: frequent cueing to maintain proximity to RW; assistance for stability Gait velocity: decreased    ADL: ADL Overall ADL's : Needs assistance/impaired Eating/Feeding: Set up, Sitting Grooming: Oral care, Standing, Min guard Grooming Details (indicate cue type and reason): pt set up her toothbrush in sitting and stood to brush teeth Upper Body Bathing: Moderate assistance Lower Body Bathing: Total assistance, Bed level Upper Body Dressing : Minimal assistance, Sitting Upper Body Dressing Details (indicate cue type and reason): front opening gown Lower Body Dressing: Total assistance Toilet  Transfer: Moderate assistance, RW Toilet Transfer Details (indicate cue type and reason): simulated to chair with stedy Toileting- Clothing Manipulation and Hygiene: Total assistance, Sit to/from stand Functional mobility during ADLs: Minimal assistance, Rolling walker, +2 for safety/equipment General ADL Comments: Pt sat EOB to wash face. While sitting EOB, she states, "this is a bunch of crap." She initially was agreeable to brushing teeth once in chair. However, after she had transferred to chair she stated, "I'm not brushing teeth. I don't have any teeth." Therapist informed her that she does have teeth but pt continued to decline.   Cognition: Cognition Overall Cognitive Status: Impaired/Different from baseline Orientation Level: Oriented to  person, Oriented to place, Oriented to situation, Disoriented to time Cognition Arousal/Alertness: Awake/alert Behavior During Therapy: WFL for tasks assessed/performed Overall Cognitive Status: Impaired/Different from baseline Area of Impairment: Memory, Problem solving Orientation Level: Disoriented to, Time Current Attention Level: Sustained Memory: Decreased short-term memory Following Commands: Follows one step commands with increased time, Follows multi-step commands inconsistently Safety/Judgement: Decreased awareness of safety, Decreased awareness of deficits Problem Solving: Slow processing General Comments: Following commands with improved processing speed.   Blood pressure (!) 149/63, pulse 64, temperature (!) 97.4 F (36.3 C), temperature source Oral, resp. rate 14, height 5\' 6"  (1.676 m), weight 87.3 kg, SpO2 93 %. Physical Exam  Constitutional: No distress.  HENT:  Head: Normocephalic and atraumatic.  Eyes: Pupils are equal, round, and reactive to light. Left eye exhibits no discharge.  Neck: Normal range of motion.  Cardiovascular: Normal rate. Exam reveals no friction rub.  No murmur heard. Respiratory: Effort normal. No respiratory distress. She has no wheezes.  GI: Soft. She exhibits no distension. There is no abdominal tenderness.  Musculoskeletal:        General: No tenderness. Edema: 1+ pedal edema.  Neurological:  Cooperative. Distracted at times. Fair insight and awareness. UE 4/5 prox to distal. LE: 3/5 HF, KE and 4/5 ADF/PF. Senses pain and LT in all 4's.   Skin: Skin is warm. She is not diaphoretic.  Psychiatric: She has a normal mood and affect. Her behavior is normal.    Results for orders placed or performed during the hospital encounter of 09/06/18 (from the past 48 hour(s))  Glucose, capillary     Status: Abnormal   Collection Time: 09/17/18  4:07 PM  Result Value Ref Range   Glucose-Capillary 116 (H) 70 - 99 mg/dL  Glucose, capillary     Status:  Abnormal   Collection Time: 09/17/18  8:01 PM  Result Value Ref Range   Glucose-Capillary 187 (H) 70 - 99 mg/dL  Glucose, capillary     Status: Abnormal   Collection Time: 09/18/18 12:13 AM  Result Value Ref Range   Glucose-Capillary 159 (H) 70 - 99 mg/dL  Glucose, capillary     Status: Abnormal   Collection Time: 09/18/18  5:20 AM  Result Value Ref Range   Glucose-Capillary 109 (H) 70 - 99 mg/dL  CBC with Differential/Platelet     Status: Abnormal   Collection Time: 09/18/18  5:22 AM  Result Value Ref Range   WBC 6.6 4.0 - 10.5 K/uL   RBC 2.70 (L) 3.87 - 5.11 MIL/uL   Hemoglobin 8.1 (L) 12.0 - 15.0 g/dL   HCT 25.5 (L) 36.0 - 46.0 %   MCV 94.4 80.0 - 100.0 fL   MCH 30.0 26.0 - 34.0 pg   MCHC 31.8 30.0 - 36.0 g/dL   RDW 17.3 (H) 11.5 - 15.5 %   Platelets  177 150 - 400 K/uL   nRBC 0.0 0.0 - 0.2 %   Neutrophils Relative % 76 %   Neutro Abs 5.1 1.7 - 7.7 K/uL   Lymphocytes Relative 16 %   Lymphs Abs 1.0 0.7 - 4.0 K/uL   Monocytes Relative 6 %   Monocytes Absolute 0.4 0.1 - 1.0 K/uL   Eosinophils Relative 0 %   Eosinophils Absolute 0.0 0.0 - 0.5 K/uL   Basophils Relative 1 %   Basophils Absolute 0.0 0.0 - 0.1 K/uL   Immature Granulocytes 1 %   Abs Immature Granulocytes 0.09 (H) 0.00 - 0.07 K/uL    Comment: Performed at [facility] [address], [place], [place] [address]  Comprehensive metabolic panel     Status: Abnormal   Collection Time: 09/18/18  5:22 AM  Result Value Ref Range   Sodium 139 135 - 145 mmol/L   Potassium 4.0 3.5 - 5.1 mmol/L   Chloride 105 98 - 111 mmol/L   CO2 26 22 - 32 mmol/L   Glucose, Bld 114 (H) 70 - 99 mg/dL   BUN 32 (H) 8 - 23 mg/dL   Creatinine, Ser 1.49 (H) 0.44 - 1.00 mg/dL   Calcium 8.8 (L) 8.9 - 10.3 mg/dL   Total Protein 5.0 (L) 6.5 - 8.1 g/dL   Albumin 2.0 (L) 3.5 - 5.0 g/dL   AST 114 (H) 15 - 41 U/L   ALT 108 (H) 0 - 44 U/L   Alkaline Phosphatase 108 38 - 126 U/L   Total Bilirubin 0.4 0.3 - 1.2 mg/dL   GFR calc non Af Amer  32 (L) >60 mL/min   GFR calc Af Amer 37 (L) >60 mL/min   Anion gap 8 5 - 15    Comment: Performed at [facility], [place] [address], [place], [place] [address]  Glucose, capillary     Status: Abnormal   Collection Time: 09/18/18  8:14 AM  Result Value Ref Range   Glucose-Capillary 111 (H) 70 - 99 mg/dL  Glucose, capillary     Status: Abnormal   Collection Time: 09/18/18 11:23 AM  Result Value Ref Range   Glucose-Capillary 118 (H) 70 - 99 mg/dL  Glucose, capillary     Status: Abnormal   Collection Time: 09/18/18  4:16 PM  Result Value Ref Range   Glucose-Capillary 111 (H) 70 - 99 mg/dL  Glucose, capillary     Status: Abnormal   Collection Time: 09/18/18  7:51 PM  Result Value Ref Range   Glucose-Capillary 174 (H) 70 - 99 mg/dL  Glucose, capillary     Status: Abnormal   Collection Time: 09/19/18 12:06 AM  Result Value Ref Range   Glucose-Capillary 127 (H) 70 - 99 mg/dL  Glucose, capillary     Status: Abnormal   Collection Time: 09/19/18  3:49 AM  Result Value Ref Range   Glucose-Capillary 109 (H) 70 - 99 mg/dL  CBC with Differential/Platelet     Status: Abnormal   Collection Time: 09/19/18  5:09 AM  Result Value Ref Range   WBC 5.3 4.0 - 10.5 K/uL   RBC 2.56 (L) 3.87 - 5.11 MIL/uL   Hemoglobin 7.6 (L) 12.0 - 15.0 g/dL   HCT 24.3 (L) 36.0 - 46.0 %   MCV 94.9 80.0 - 100.0 fL   MCH 29.7 26.0 - 34.0 pg   MCHC 31.3 30.0 - 36.0 g/dL   RDW 17.5 (H) 11.5 - 15.5 %   Platelets 188 150 - 400  K/uL   nRBC 0.0 0.0 - 0.2 %   Neutrophils Relative % 74 %   Neutro Abs 3.9 1.7 - 7.7 K/uL   Lymphocytes Relative 19 %   Lymphs Abs 1.0 0.7 - 4.0 K/uL   Monocytes Relative 6 %   Monocytes Absolute 0.3 0.1 - 1.0 K/uL   Eosinophils Relative 0 %   Eosinophils Absolute 0.0 0.0 - 0.5 K/uL   Basophils Relative 0 %   Basophils Absolute 0.0 0.0 - 0.1 K/uL   Immature Granulocytes 1 %   Abs Immature Granulocytes 0.06 0.00 - 0.07 K/uL    Comment: Performed at [facility] [address] [place], [place] [address]  Comprehensive metabolic panel     Status: Abnormal   Collection Time: 09/19/18  5:09 AM  Result Value Ref Range   Sodium 139 135 - 145 mmol/L   Potassium 3.8 3.5 - 5.1 mmol/L   Chloride 105 98 - 111 mmol/L   CO2 27 22 - 32 mmol/L   Glucose, Bld 117 (H) 70 - 99 mg/dL   BUN 29 (H) 8 - 23 mg/dL   Creatinine, Ser 1.52 (H) 0.44 - 1.00 mg/dL   Calcium 8.5 (L) 8.9 - 10.3 mg/dL   Total Protein 4.9 (L) 6.5 - 8.1 g/dL   Albumin 1.9 (L) 3.5 - 5.0 g/dL   AST 88 (H) 15 - 41 U/L   ALT 92 (H) 0 - 44 U/L   Alkaline Phosphatase 103 38 - 126 U/L   Total Bilirubin 0.5 0.3 - 1.2 mg/dL   GFR calc non Af Amer 31 (L) >60 mL/min   GFR calc Af Amer 36 (L) >60 mL/min   Anion gap 7 5 - 15    Comment: Performed at [facility], [place] [address] [place], [place] [address]  Glucose, capillary     Status: Abnormal   Collection Time: 09/19/18  7:54 AM  Result Value Ref Range   Glucose-Capillary 105 (H) 70 - 99 mg/dL  Glucose, capillary     Status: Abnormal   Collection Time: 09/19/18 11:52 AM  Result Value Ref Range   Glucose-Capillary 113 (H) 70 - 99 mg/dL   No results found.     Medical Problem List and Plan: 1.  Functional deficits and weakness secondary to debility and encephalopathy after urosepsis/pancreatitis and multiple medical issues 2.  Antithrombotics: -PAF/DVT/anticoagulation:  Pharmaceutical: Other (comment)- Eliquis  -antiplatelet therapy:  3. Pain Management: Tylenol prn.  4. Mood: LCSW to follow for evaluation and support.   -antipsychotic agents: N/A 5. Neuropsych: This patient is capable of making decisions on her own behalf. 6. Skin/Wound Care: Routine pressure relief measures.  7. Fluids/Electrolytes/Nutrition: Strict I/O. Check lytes in am.  8. Enterococcus bacteremia/UTI: Continue Maxipime D# 12/14.  9. Anasarca/FLuid overload: Add heart healthy restrictions. Weight daily. Lasix daily prn depending stability of weights/symptoms.  10. PAF:  Monitor HR bid--back on amiodarone and Eliquis. Off metoprolol due to bradycardia.   -HR in 60's today 11. Urinary retention: Monitor voiding with PVR checks. Cath for volumes > 350 cc.  -OOB to void 12. Acute on Chronic renal failure:  SCr- 1.32 on 04/2018. Continue to monitor with routine checks.  Has improved from 50/3.24---> 29/1.25 13. Delirium:  Resolving.  14. Anemia of chronic disease: Baseline Hg around 9.0? Likely drop after hydration.   Monitor for signs of bleeding. None at present 15. Acute pancreatitis:  Po intake remains poor 0- 20% per records.  Offer supplements with meals.  16. Hypothyroidism: Now synthroid 75 mcg/day.        Bary Leriche, PA-C 09/19/2018

## 2018-09-19 NOTE — Care Management Important Message (Signed)
Important Message  Patient Details  Name: Maureen Stout MRN: [id number] Date of Birth: 14-Sep-1931   Medicare Important Message Given:  Yes    Terri Rorrer 09/19/2018, 4:02 PM

## 2018-09-20 ENCOUNTER — Inpatient Hospital Stay (HOSPITAL_COMMUNITY): Payer: Medicare PPO | Admitting: Speech Pathology

## 2018-09-20 ENCOUNTER — Inpatient Hospital Stay (HOSPITAL_COMMUNITY): Payer: Medicare PPO | Admitting: Occupational Therapy

## 2018-09-20 ENCOUNTER — Inpatient Hospital Stay (HOSPITAL_COMMUNITY): Payer: Medicare PPO | Admitting: Physical Therapy

## 2018-09-20 DIAGNOSIS — N183 Chronic kidney disease, stage 3 (moderate): Secondary | ICD-10-CM

## 2018-09-20 DIAGNOSIS — D638 Anemia in other chronic diseases classified elsewhere: Secondary | ICD-10-CM | POA: Insufficient documentation

## 2018-09-20 DIAGNOSIS — R339 Retention of urine, unspecified: Secondary | ICD-10-CM

## 2018-09-20 DIAGNOSIS — R601 Generalized edema: Secondary | ICD-10-CM

## 2018-09-20 DIAGNOSIS — N189 Chronic kidney disease, unspecified: Secondary | ICD-10-CM | POA: Insufficient documentation

## 2018-09-20 DIAGNOSIS — K859 Acute pancreatitis without necrosis or infection, unspecified: Secondary | ICD-10-CM

## 2018-09-20 DIAGNOSIS — N179 Acute kidney failure, unspecified: Secondary | ICD-10-CM | POA: Insufficient documentation

## 2018-09-20 DIAGNOSIS — R7881 Bacteremia: Secondary | ICD-10-CM

## 2018-09-20 LAB — COMPREHENSIVE METABOLIC PANEL
ALT: 78 U/L — ABNORMAL HIGH (ref 0–44)
AST: 66 U/L — ABNORMAL HIGH (ref 15–41)
Albumin: 1.8 g/dL — ABNORMAL LOW (ref 3.5–5.0)
Alkaline Phosphatase: 92 U/L (ref 38–126)
Anion gap: 8 (ref 5–15)
BUN: 25 mg/dL — ABNORMAL HIGH (ref 8–23)
CO2: 25 mmol/L (ref 22–32)
Calcium: 8.4 mg/dL — ABNORMAL LOW (ref 8.9–10.3)
Chloride: 105 mmol/L (ref 98–111)
Creatinine, Ser: 1.49 mg/dL — ABNORMAL HIGH (ref 0.44–1.00)
GFR calc Af Amer: 37 mL/min — ABNORMAL LOW (ref >60)
GFR calc non Af Amer: 32 mL/min — ABNORMAL LOW (ref >60)
Glucose, Bld: 115 mg/dL — ABNORMAL HIGH (ref 70–99)
Potassium: 3.6 mmol/L (ref 3.5–5.1)
Sodium: 138 mmol/L (ref 135–145)
Total Bilirubin: 0.4 mg/dL (ref 0.3–1.2)
Total Protein: 4.6 g/dL — ABNORMAL LOW (ref 6.5–8.1)

## 2018-09-20 LAB — CBC WITH DIFFERENTIAL/PLATELET
Abs Immature Granulocytes: 0.06 10*3/uL (ref 0.00–0.07)
Basophils Absolute: 0 10*3/uL (ref 0.0–0.1)
Basophils Relative: 0 %
Eosinophils Absolute: 0 10*3/uL (ref 0.0–0.5)
Eosinophils Relative: 0 %
HCT: 23.7 % — ABNORMAL LOW (ref 36.0–46.0)
Hemoglobin: 7.5 g/dL — ABNORMAL LOW (ref 12.0–15.0)
Immature Granulocytes: 1 %
Lymphocytes Relative: 19 %
Lymphs Abs: 0.9 10*3/uL (ref 0.7–4.0)
MCH: 30.1 pg (ref 26.0–34.0)
MCHC: 31.6 g/dL (ref 30.0–36.0)
MCV: 95.2 fL (ref 80.0–100.0)
Monocytes Absolute: 0.4 10*3/uL (ref 0.1–1.0)
Monocytes Relative: 9 %
Neutro Abs: 3.3 10*3/uL (ref 1.7–7.7)
Neutrophils Relative %: 71 %
Platelets: 190 10*3/uL (ref 150–400)
RBC: 2.49 MIL/uL — ABNORMAL LOW (ref 3.87–5.11)
RDW: 17.4 % — ABNORMAL HIGH (ref 11.5–15.5)
WBC: 4.7 10*3/uL (ref 4.0–10.5)
nRBC: 0 % (ref 0.0–0.2)

## 2018-09-20 MED ORDER — SODIUM CHLORIDE 0.9 % IV SOLN
2.0000 g | Freq: Two times a day (BID) | INTRAVENOUS | Status: AC
  Administered 2018-09-21 (×2): 2 g via INTRAVENOUS
  Filled 2018-09-20 (×2): qty 2

## 2018-09-20 MED ORDER — AMOXICILLIN 500 MG PO CAPS
500.0000 mg | ORAL_CAPSULE | Freq: Every day | ORAL | Status: DC
  Administered 2018-09-22 – 2018-10-09 (×18): 500 mg via ORAL
  Filled 2018-09-20 (×19): qty 1

## 2018-09-20 NOTE — Progress Notes (Addendum)
[facility] PHYSICAL MEDICINE & REHABILITATION PROGRESS NOTE  Subjective/Complaints: Patient seen sitting up in bed this AM.  She states she slept fairly overnight, but does not feel well overall.  ROS: Denies CP, SOB, N/V/D  Objective: Vital Signs: Blood pressure (!) 137/56, pulse 70, temperature 97.9 F (36.6 C), temperature source Oral, resp. rate 16, height 5\' 6"  (1.676 m), weight 83.7 kg, SpO2 97 %. No results found. Recent Labs    09/19/18 0509 09/20/18 0336  WBC 5.3 4.7  HGB 7.6* 7.5*  HCT 24.3* 23.7*  PLT 188 190   Recent Labs    09/19/18 0509 09/20/18 0336  NA 139 138  K 3.8 3.6  CL 105 105  CO2 27 25  GLUCOSE 117* 115*  BUN 29* 25*  CREATININE 1.52* 1.49*  CALCIUM 8.5* 8.4*    Physical Exam: BP (!) 137/56 (BP Location: Left Arm)   Pulse 70   Temp 97.9 F (36.6 C) (Oral)   Resp 16   Ht 5\' 6"  (1.676 m)   Wt 83.7 kg   SpO2 97%   BMI 29.78 kg/m  Constitutional: No distress . Vital signs reviewed. HENT: Normocephalic.  Atraumatic. Eyes: EOMI. No discharge. Cardiovascular: No JVD. Respiratory: Normal effort. GI: Non-distended. Musc: No edema or tenderness in extremities. Musculoskeletal: Generalized edema Neurological:  Alert Cooperative.  Fair insight and awareness.  Motor: Grossly 4-/5 throughout Skin: Frail skin. Psychiatric: She has anormal mood and affect. Herbehavior is normal.  Assessment/Plan: 1. Functional deficits secondary to debility and encephalopathy which require 3+ hours per day of interdisciplinary therapy in a comprehensive inpatient rehab setting.  Physiatrist is providing close team supervision and 24 hour management of active medical problems listed below.  Physiatrist and rehab team continue to assess barriers to discharge/monitor patient progress toward functional and medical goals  Care Tool:  Bathing              Bathing assist       Upper Body Dressing/Undressing Upper body dressing        Upper body  assist      Lower Body Dressing/Undressing Lower body dressing            Lower body assist       Toileting Toileting    Toileting assist Assist for toileting: Maximal Assistance - Patient 25 - 49%     Transfers Chair/bed transfer  Transfers assist           Locomotion Ambulation   Ambulation assist              Walk 10 feet activity   Assist           Walk 50 feet activity   Assist           Walk 150 feet activity   Assist           Walk 10 feet on uneven surface  activity   Assist           Wheelchair     Assist               Wheelchair 50 feet with 2 turns activity    Assist            Wheelchair 150 feet activity     Assist            Medical Problem List and Plan: 1.Functional deficits and weaknesssecondary to debility and encephalopathy after urosepsis/pancreatitis and multiple medical issues  Begin CIR, discussed with therapies, made  15/7 - given multiple medical issues will need to monitor closely with stress of intensive excercises  Notes reviewed - multifactorial debility, labs reviewed 2. Antithrombotics: -PAF/DVT/anticoagulation:Pharmaceutical:Other (comment)- Eliquis -antiplatelet therapy:  3. Pain Management:Tylenol prn. 4. Mood:LCSW to follow for evaluation and support. -antipsychotic agents: N/A 5. Neuropsych: This patientisnot fully capable of making decisions on herown behalf. 6. Skin/Wound Care:Routine pressure relief measures. 7. Fluids/Electrolytes/Nutrition:Strict I/O.   Poor PO intake, ?related to nausea - will consider scheduling antiemetics if necessary 8. Enterococcus bacteremia/UTI: Continue Maxipime D# 13/14.  9. Anasarca/FLuid overload: Added heart healthy restrictions. Weight daily. Lasix daily prn depending stability of weights/symptoms.  Filed Weights   09/19/18 1746 09/20/18 0356  Weight: 87.3 kg 83.7 kg    ?Reliability on 5/4 10. PAF: Monitor HR bid--back on amiodarone and Eliquis. Off metoprolol due to bradycardia. 11. Urinary retention:   PVRs showing retention  Flomax started on 5/5  Cath for volumes > 350 cc. 12. Acute on Chronic renal failure: SCr- 1.32 on 04/2018. Continue to monitor with routine checks.   Cr 1.49 on 5/5 13. Delirium: Resolving.  14. Anemia of chronic disease: Baseline Hg around 9.0? Likely dropafterhydration.   Monitor for signs of bleeding.   Hemoglobin 7.5 on 5/5  Continue to monitor 15. Acute pancreatitis: Offer supplements with meals.   LFTs elevated on 5/5  Continue to monitor 16. Hypothyroidism: Now synthroid 75 mcg/day.  LOS: 1 days A FACE TO FACE EVALUATION WAS PERFORMED  Ankit [name] 09/20/2018, 8:38 AM

## 2018-09-20 NOTE — Evaluation (Signed)
Speech Language Pathology Assessment and Plan  Patient Details  Name: Maureen Stout MRN: [id number] Date of Birth: 1931/06/07  SLP Diagnosis: Speech and Language deficits;Cognitive Impairments  Rehab Potential: Good ELOS: 10-14 days     Today's Date: 09/20/2018 SLP Individual Time: 1030-1120 SLP Individual Time Calculation (min): 50 min  Missed Time: 10 min, fatigue    Problem List:  Patient Active Problem List   Diagnosis Date Noted  . Acute pancreatitis   . Anasarca   . Anemia of chronic disease   . AKI (acute kidney injury) ([place])   . Stage 3 chronic kidney disease ([place])   . Urinary retention   . Debility 09/19/2018  . Sepsis due to Enterobacter species ([place]) 09/08/2018  . Bacteremia 09/08/2018  . Hypoglycemia without diagnosis of diabetes mellitus 09/08/2018  . Acute on chronic anemia 09/07/2018  . Acute pancreatitis without infection or necrosis 09/07/2018  . Bradycardia 09/07/2018  . Sinus bradycardia 09/06/2018  . Hypothyroidism 09/06/2018  . First degree AV block 09/06/2018  . Abnormal CXR 04/13/2018  . On amiodarone therapy 01/08/2018  . Chronic anticoagulation 01/08/2018  . Hypertensive heart disease 01/08/2018  . Elevated TSH 01/08/2018  . Pressure injury of skin 04/19/2017  . Paroxysmal atrial fibrillation ([place]) 04/18/2017   Past Medical History:  Past Medical History:  Diagnosis Date  . Acute on chronic anemia 09/07/2018  . Bladder problem   . CKD (chronic kidney disease), stage III ([place])   . Diabetes mellitus ([place])   . Dyslipidemia   . Hypertension   . Paroxysmal A-fib ([place])   . Paroxysmal atrial flutter ([place])   . Sinus bradycardia    Past Surgical History:  Past Surgical History:  Procedure Laterality Date  . PARTIAL HYSTERECTOMY    . TOE SURGERY    . TONSILLECTOMY      Assessment / Plan / Recommendation Clinical Impression Patient is an 85 year old female with history of T2DM, PAF, CKD, enterovesicular fistula, sinus bradycardiawho was  admitted on 09/06/2018 with 1 week history of dizziness and weakness.She was found to have marked bradycardia with heart rate in 30s,acute on chronic renal failure as well as elevated TSH-9.2. Dr. Martinique recommended discontinuation of amiodarone and metoprolol and AKItreated with IV fluids.She developed hypotension and abdominal pain due to urosepsis and was started on IV cefepime for treatment. CT abdomen revealed acute pancreatitis and treated with NPO and D5NS.She has had issues with confusion and agitation due to delirium. Mentation improved briefly but she developed lethargy with hypothermia felt to be due to sepsis. As bradycardia resolved with amiodarone was resumed on 4/24.Acute on chronic anemia treated with 1 unit packed red blood cells and thrombocytopenia being monitored.Follow up CT abdomen 4/27,showed no change in mild acute pancreatitis with increase in diffuse body wall edema with small to moderate bilateral pleural effusions. Anasarca monitored initially and she required transfusion with additional unit of PRBC 4/8 due to drop in H/H to 6.7/21.6. She continues to have intermittent hypothermia and mentation slowly improving. She continues to require I/O caths for urinary retention. Weights on upward trend to 198 lbs therefore treated with IV lasix 5/3 and 5/4. Therapy ongoing and patient noted to be debilitated with poor safety awareness. CIR recommended due to functional decline and patient admitted 09/19/18.  Patient demonstrates severe cognitive-linguistic deficits characterized by impaired sustained attention, functional problem solving, intellectual awareness, and recall with use of strategies which impacts her overall safety and functional independence. Patient also demonstrates generalized confusion with decreased ability to follow  two and three step commands and deficits in word-finding at the word and phrase level which impacts her ability to expressio mildly complex  wants/needs. Patient would benefit from skilled SLP intervention to maximize her cognitive-linguistic function and overall functional independence prior to discharge.    Skilled Therapeutic Interventions          Adminstiered a cognitive-linguistic evaluation, please see above for details. Educated patient and her son (via telephone) on patient's current cognitive-linguistic deficits and goals of skilled SLP intervention, both verbalized understanding and agreement.  Throughout session, patient reported consistently, "I can't think" and "I am really confused" and required Max verbal cues to answer basic questions and for use of word-finding strategies at the word and phrase level. Patient left upright in bed with alarm on and all needs within reach. Continue with current plan of care.     SLP Assessment  Patient will need skilled [place] Pathology Services during CIR admission    Recommendations  Oral Care Recommendations: Oral care BID Recommendations for Other Services: Neuropsych consult Patient destination: Home Follow up Recommendations: 24 hour supervision/assistance;Home Health SLP Equipment Recommended: None recommended by SLP    SLP Frequency 3 to 5 out of 7 days   SLP Duration  SLP Intensity  SLP Treatment/Interventions 10-14 days   Minumum of 1-2 x/day, 30 to 90 minutes  Cognitive remediation/compensation;Environmental controls;Internal/external aids;Speech/Language facilitation;Therapeutic Activities;Patient/family education;Functional tasks;Cueing hierarchy    Pain Pain Assessment Pain Score: 0-No pain  Prior Functioning Type of Home: House  Lives With: Alone Available Help at Discharge: Family;Available 24 hours/day  Short Term Goals: Week 1: SLP Short Term Goal 1 (Week 1): Patient will demonstrate sustained attention to functional tasks for 30 minutes with Min A verbal cues for redirection SLP Short Term Goal 2 (Week 1): Patient will demonstrate functional  problem solving for basic and familair tasks with Mod A verbal cues.  SLP Short Term Goal 3 (Week 1): Patient will utilize external aids to recall new, daily information with Max A multimodal cues.  SLP Short Term Goal 4 (Week 1): Patient wil follow multi-step commands with Mod A verbal and visual cues.  SLP Short Term Goal 5 (Week 1): Patient will verbalize basic information/wants and needs at the phrase level with Mod A verbal cues for word-finding.   Refer to Care Plan for Long Term Goals  Recommendations for other services: Neuropsych  Discharge Criteria: Patient will be discharged from SLP if patient refuses treatment 3 consecutive times without medical reason, if treatment goals not met, if there is a change in medical status, if patient makes no progress towards goals or if patient is discharged from hospital.  The above assessment, treatment plan, treatment alternatives and goals were discussed and mutually agreed upon: by patient and by family  Sophee Mckimmy, [place] 09/20/2018, 12:55 PM

## 2018-09-20 NOTE — Progress Notes (Signed)
Inpatient Rehabilitation  Patient information reviewed and entered into eRehab system by Vianney Kopecky M. Chrisie Jankovich, M.A., CCC/SLP, PPS Coordinator.  Information including medical coding, functional ability and quality indicators will be reviewed and updated through discharge.    

## 2018-09-20 NOTE — Care Management Note (Signed)
[facility] Individual Statement of Services  Patient Name:  Maureen Stout  Date:  09/20/2018  Welcome to the [facility].  Our goal is to provide you with an individualized program based on your diagnosis and situation, designed to meet your specific needs.  With this comprehensive rehabilitation program, you will be expected to participate in at least 3 hours of rehabilitation therapies Monday-Friday, with modified therapy programming on the weekends.  Your rehabilitation program will include the following services:  Physical Therapy (PT), Occupational Therapy (OT), Speech Therapy (ST), 24 hour per day rehabilitation nursing, Neuropsychology, Case Management (Social Worker), Rehabilitation Medicine, Nutrition Services and Pharmacy Services  Weekly team conferences will be held on Wednesday to discuss your progress.  Your Social Worker will talk with you frequently to get your input and to update you on team discussions.  Team conferences with you and your family in attendance may also be held.  Expected length of stay: 14-18 days  Overall anticipated outcome: supervision with cues  Depending on your progress and recovery, your program may change. Your Social Worker will coordinate services and will keep you informed of any changes. Your Social Worker's name and contact numbers are listed  below.  The following services may also be recommended but are not provided by the [facility]:    [place] will be made to provide these services after discharge if needed.  Arrangements include referral to agencies that provide these services.  Your insurance has been verified to be:  [employer] Your primary doctor is:  Nelda Bucks  Pertinent information will be shared with your doctor and your insurance company.  Social Worker:  Ovidio Kin, [place] or ([phone]  Information discussed with and copy given to patient by: [name], 09/20/2018, 10:14 AM

## 2018-09-20 NOTE — Evaluation (Signed)
Occupational Therapy Assessment and Plan  Patient Details  Name: Maureen Stout MRN: [id number] Date of Birth: 1931/12/07  OT Diagnosis: altered mental status, cognitive deficits and muscle weakness (generalized) Rehab Potential: Rehab Potential (ACUTE ONLY): Fair ELOS: 14-18 days   Today's Date: 09/20/2018 OT Individual Time: 9628-3662 OT Individual Time Calculation (min): 30 min     Problem List:  Patient Active Problem List   Diagnosis Date Noted  . Acute pancreatitis   . Anasarca   . Anemia of chronic disease   . AKI (acute kidney injury) ([place])   . Stage 3 chronic kidney disease ([place])   . Urinary retention   . Debility 09/19/2018  . Sepsis due to Enterobacter species ([place]) 09/08/2018  . Bacteremia 09/08/2018  . Hypoglycemia without diagnosis of diabetes mellitus 09/08/2018  . Acute on chronic anemia 09/07/2018  . Acute pancreatitis without infection or necrosis 09/07/2018  . Bradycardia 09/07/2018  . Sinus bradycardia 09/06/2018  . Hypothyroidism 09/06/2018  . First degree AV block 09/06/2018  . Abnormal CXR 04/13/2018  . On amiodarone therapy 01/08/2018  . Chronic anticoagulation 01/08/2018  . Hypertensive heart disease 01/08/2018  . Elevated TSH 01/08/2018  . Pressure injury of skin 04/19/2017  . Paroxysmal atrial fibrillation ([place]) 04/18/2017    Past Medical History:  Past Medical History:  Diagnosis Date  . Acute on chronic anemia 09/07/2018  . Bladder problem   . CKD (chronic kidney disease), stage III ([place])   . Diabetes mellitus ([place])   . Dyslipidemia   . Hypertension   . Paroxysmal A-fib ([place])   . Paroxysmal atrial flutter ([place])   . Sinus bradycardia    Past Surgical History:  Past Surgical History:  Procedure Laterality Date  . PARTIAL HYSTERECTOMY    . TOE SURGERY    . TONSILLECTOMY      Assessment & Plan Clinical Impression: Patient is a 85 y.o. year old female with history of T2DM, PAF, CKD, enterovesicular fistula, sinus bradycardiawho was  admitted on 09/06/2018 with 1 week history of dizziness and weakness.She was found to have marked bradycardia with heart rate in 30s,acute on chronic renal failure as well as elevated TSH-9.2. Dr. Martinique recommended discontinuation of amiodarone and metoprolol and AKItreated with IV fluids.She developed hypotension and abdominal pain due to urosepsis and was started on IV cefepime for treatment. CT abdomen revealed acute pancreatitis and treated with NPO and D5NS.She has had issues with confusion and agitation due to delirium. Mentation improved briefly but she developed lethargy with hypothermia felt to be due to sepsis.   As bradycardia resolved with amiodarone was resumed on 4/24.Acute on chronic anemia treated with 1 unit packed red blood cells and thrombocytopenia being monitored.Follow up CT abdomen 4/27,showed no change in mild acute pancreatitis with increase in diffuse body wall edema with small to moderate bilateral pleural effusions. Anasarca monitored initially and she required transfusion with additional unit of PRBC 4/8 due to drop in H/H to 6.7/21.6. She continues to have intermittent hypothermia and mentation slowly improving. She continues to require I/O caths for urinary retention. Weights on upward trend to 198 lbs therefore treated with IV lasix 5/3 and 5/4. Therapy ongoing and patient noted to be debilitated with poor safety awareness. CIR recommended due to functional decline. Patient transferred to CIR on 09/19/2018 .    Patient currently requires max with basic self-care skills secondary to muscle weakness, decreased cardiorespiratoy endurance, decreased attention, decreased awareness, decreased problem solving, decreased safety awareness, decreased memory and delayed processing and decreased sitting  balance, decreased standing balance and decreased postural control.  Prior to hospitalization, patient could complete ADLs and IADLs with modified independent  .  Patient will benefit from skilled intervention to decrease level of assist with basic self-care skills prior to discharge home with care partner.  Anticipate patient will require 24 hour supervision and follow up home health.  OT - End of Session Activity Tolerance: Tolerates 10 - 20 min activity with multiple rests Endurance Deficit: Yes Endurance Deficit Description: very deconditioned, unable to sit at EOB >5 mins OT Assessment Rehab Potential (ACUTE ONLY): Fair OT Patient demonstrates impairments in the following area(s): Balance;Cognition;Endurance;Edema;Motor;Pain;Safety;Perception;Sensory OT Basic ADL's Functional Problem(s): Grooming;Bathing;Dressing;Toileting OT Transfers Functional Problem(s): Toilet;Tub/Shower OT Additional Impairment(s): None OT Plan OT Intensity: Minimum of 1-2 x/day, 45 to 90 minutes OT Frequency: Total of 15 hours over 7 days of combined therapies OT Duration/Estimated Length of Stay: 14-18 days OT Treatment/Interventions: Balance/vestibular training;Cognitive remediation/compensation;Community reintegration;Discharge planning;Disease mangement/prevention;DME/adaptive equipment instruction;Functional mobility training;Pain management;Patient/family education;Psychosocial support;Self Care/advanced ADL retraining;Therapeutic Activities;Therapeutic Exercise;UE/LE Strength taining/ROM;UE/LE Coordination activities OT Self Feeding Anticipated Outcome(s): Setup OT Basic Self-Care Anticipated Outcome(s): Supervision OT Toileting Anticipated Outcome(s): Supervision OT Bathroom Transfers Anticipated Outcome(s): Supervision OT Recommendation Patient destination: Home Follow Up Recommendations: Home health OT;24 hour supervision/assistance;Skilled nursing facility Equipment Recommended: To be determined   Skilled Therapeutic Intervention Limited OT eval due to c/o nausea and fatigue.  Therapist educated pt on rehab process, OT purpose, POC, and goals of therapy.   Pt reports nausea however agreeable to attempting transfer to recliner.  Pt required increased time and mod assist with rolling and sidelying to sitting at EOB due to dizziness and nausea.  Min assist for sitting balance at EOB and pt stating this is too much, therefore returned to supine in bed with mod assist.  Pt washed face and then stated that she felt she was going to be sick.  Dry heaves, but no vomiting.  RN aware of current situation.  Pt remained reclined in bed with all needs in reach.  OT Evaluation Precautions/Restrictions  Precautions Precautions: Fall General OT Amount of Missed Time: 45 Minutes Vital Signs Therapy Vitals Temp: 97.6 F (36.4 C) Temp Source: Oral Pulse Rate: 62 Resp: 17 BP: 133/60 Patient Position (if appropriate): Lying Oxygen Therapy SpO2: 94 % O2 Device: Room Air Pain Pain Assessment Pain Scale: 0-10 Pain Score: 0-No pain Home Living/Prior Functioning Home Living Family/patient expects to be discharged to:: Private residence Living Arrangements: Alone Available Help at Discharge: Family, Available 24 hours/day Type of Home: House Home Access: Ramped entrance(pt reports level entry; ramp per acute chart) Home Layout: One level Additional Comments: pt with inconsistent reports about home setup, will need to verify with family  Lives With: Alone Prior Function Level of Independence: Requires assistive device for independence Driving: No Comments: 6wks with walker prior to that was independent, doesn't drive ADL  Pt refused all bathing and dressing due to nausea. Vision Baseline Vision/History: Wears glasses Wears Glasses: Reading only(reports doesn't wear them often) Patient Visual Report: No change from baseline Vision Assessment?: Vision impaired- to be further tested in functional context Additional Comments: pt with c/o nausea throughout session, kept eyes closed majority of session Cognition Overall Cognitive Status:  Impaired/Different from baseline Arousal/Alertness: Lethargic Orientation Level: Person;Place(oriented to name and September for DOB) Year: ("September....I don't know") Month: September Day of Week: ("I don't know") Memory: Impaired Memory Impairment: Decreased recall of new information;Decreased short term memory;Storage deficit Decreased Short Term Memory: Verbal basic;Functional basic Immediate Memory Recall: Bed(required repetition   and cue to recall blue and socks) Memory Recall: ("September.Marland KitchenMarland KitchenI just don't know") Attention: Sustained Sustained Attention: Impaired Sustained Attention Impairment: Functional basic;Verbal basic Awareness: Impaired Awareness Impairment: Intellectual impairment Problem Solving: Impaired Problem Solving Impairment: Verbal basic;Functional basic Safety/Judgment: Impaired Sensation Sensation Light Touch: Impaired by gross assessment Proprioception: Impaired by gross assessment Additional Comments: difficult to assess due to decreased sustained attention.  Reports impaired sensation in LUE compared to RUE and absent in fingers.  Coordination Gross Motor Movements are Fluid and Coordinated: No Fine Motor Movements are Fluid and Coordinated: No Coordination and Movement Description: requires cuing for functional mobility tasks due to impaired processing and motor planning Extremity/Trunk Assessment RUE Assessment RUE Assessment: Exceptions to [facility] Passive Range of Motion (PROM) Comments: WFL Active Range of Motion (AROM) Comments: WFL General Strength Comments: overall deconditioned 3+ to 4/5 LUE Assessment LUE Assessment: Exceptions to [facility] Passive Range of Motion (PROM) Comments: WFL Active Range of Motion (AROM) Comments: WFL General Strength Comments: overall deconditioned 3+ to 4/5     Refer to Care Plan for Long Term Goals  Recommendations for other services: None    Discharge Criteria: Patient will be discharged from OT if patient refuses  treatment 3 consecutive times without medical reason, if treatment goals not met, if there is a change in medical status, if patient makes no progress towards goals or if patient is discharged from hospital.  The above assessment, treatment plan, treatment alternatives and goals were discussed and mutually agreed upon: by patient  Ellwood Dense [facility] 09/20/2018, 3:20 PM

## 2018-09-20 NOTE — Consult Note (Signed)
[facility] Nurse wound consult note Patient receiving care in [facility] [id number].  I spoke with her primary RN, Felizardo Hoffmann, via telephone.  WOC consult completed remotely. Reason for Consult:"sacral injury" Wound type:According to [place], the lLeft buttock had had a "blister" that popped 2 days ago leaving a shiny, pink, superficial wound.  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 Pressure Injury POA: No Measurement: To be provided by Felizardo Hoffmann in the flowsheet today. Wound bed: pink, superficial Drainage (amount, consistency, odor) none Periwound: intact Dressing procedure/placement/frequency: I have enacted the Skin Care Standing orders for the area.  To be dressed with a foam dressing, which is what the patient has on now. Monitor the wound area(s) for worsening of condition such as: Signs/symptoms of infection,  Increase in size,  Development of or worsening of odor, Development of pain, or increased pain at the affected locations.  Notify the medical team if any of these develop.  Thank you for the consult.  Discussed plan of care with the bedside nurse.  [place] nurse will not follow at this time.  Please re-consult the [facility] team if needed.  Val Riles, RN, MSN, CWOCN, CNS-BC, pager [phone]

## 2018-09-20 NOTE — Evaluation (Signed)
Physical Therapy Assessment and Plan  Patient Details  Name: Maureen Stout MRN: [id number] Date of Birth: 1931-12-19  PT Diagnosis: Abnormal posture, Difficulty walking, Impaired cognition and Muscle weakness Rehab Potential: Fair ELOS: 10-14 days   Today's Date: 09/20/2018 PT Individual Time: 0830-0900 PT Individual Time Calculation (min): 30 min    Problem List:  Patient Active Problem List   Diagnosis Date Noted  . Acute pancreatitis   . Anasarca   . Anemia of chronic disease   . AKI (acute kidney injury) ([place])   . Stage 3 chronic kidney disease ([place])   . Urinary retention   . Debility 09/19/2018  . Sepsis due to Enterobacter species ([place]) 09/08/2018  . Bacteremia 09/08/2018  . Hypoglycemia without diagnosis of diabetes mellitus 09/08/2018  . Acute on chronic anemia 09/07/2018  . Acute pancreatitis without infection or necrosis 09/07/2018  . Bradycardia 09/07/2018  . Sinus bradycardia 09/06/2018  . Hypothyroidism 09/06/2018  . First degree AV block 09/06/2018  . Abnormal CXR 04/13/2018  . On amiodarone therapy 01/08/2018  . Chronic anticoagulation 01/08/2018  . Hypertensive heart disease 01/08/2018  . Elevated TSH 01/08/2018  . Pressure injury of skin 04/19/2017  . Paroxysmal atrial fibrillation ([place]) 04/18/2017    Past Medical History:  Past Medical History:  Diagnosis Date  . Acute on chronic anemia 09/07/2018  . Bladder problem   . CKD (chronic kidney disease), stage III ([place])   . Diabetes mellitus ([place])   . Dyslipidemia   . Hypertension   . Paroxysmal A-fib ([place])   . Paroxysmal atrial flutter ([place])   . Sinus bradycardia    Past Surgical History:  Past Surgical History:  Procedure Laterality Date  . PARTIAL HYSTERECTOMY    . TOE SURGERY    . TONSILLECTOMY      Assessment & Plan Clinical Impression: Patient is a 85 y.o. year old female with recent admission to the hospital on 09/06/2018 with 1 week history of dizziness and weakness.She was found to  have marked bradycardia with heart rate in 30s,acute on chronic renal failure as well as elevated TSH-9.2. Dr. Martinique recommended discontinuation of amiodarone and metoprolol and AKItreated with IV fluids.She developed hypotension and abdominal pain due to urosepsis and was started on IV cefepime for treatment. CT abdomen revealed acute pancreatitis and treated with NPO and D5NS.She has had issues with confusion and agitation due to delirium. Mentation improved briefly but she developed lethargy with hypothermia felt to be due to sepsis.   As bradycardia resolved with amiodarone was resumed on 4/24.Acute on chronic anemia treated with 1 unit packed red blood cells and thrombocytopenia being monitored.Follow up CT abdomen 4/27,showed no change in mild acute pancreatitis with increase in diffuse body wall edema with small to moderate bilateral pleural effusions. Anasarca monitored initially and she required transfusion with additional unit of PRBC 4/8 due to drop in H/H to 6.7/21.6. She continues to have intermittent hypothermia and mentation slowly improving. She continues to require I/O caths for urinary retention. Weights on upward trend to 198 lbs therefore treated with IV lasix 5/3 and 5/4.  Patient transferred to CIR on 09/19/2018 .   Patient currently requires mod with mobility secondary to muscle weakness, decreased coordination and decreased sitting balance, decreased standing balance, decreased postural control and decreased balance strategies.  Prior to hospitalization, patient was modified independent  with mobility and lived with Alone in a House home.  Home access is  Ramped entrance.  Patient will benefit from skilled PT intervention to maximize  safe functional mobility, minimize fall risk and decrease caregiver burden for planned discharge home with 24 hour assist.  Anticipate patient will benefit from follow up [facility] at discharge.  PT - End of Session Activity Tolerance:  Tolerates 10 - 20 min activity with multiple rests Endurance Deficit: Yes PT Assessment Rehab Potential (ACUTE/IP ONLY): Fair PT Barriers to Discharge: Decreased caregiver support;Medical stability PT Patient demonstrates impairments in the following area(s): Balance;Behavior;Edema;Endurance;Motor;Safety PT Transfers Functional Problem(s): Bed Mobility;Bed to Chair;Car;Furniture PT Locomotion Functional Problem(s): Wheelchair Mobility;Ambulation PT Plan PT Intensity: Minimum of 1-2 x/day ,45 to 90 minutes PT Frequency: 5 out of 7 days PT Duration Estimated Length of Stay: 10-14 days PT Treatment/Interventions: Ambulation/gait training;Community reintegration;DME/adaptive equipment instruction;Neuromuscular re-education;Stair training;UE/LE Strength taining/ROM;Wheelchair propulsion/positioning;UE/LE Coordination activities;Therapeutic Activities;Pain management;Discharge planning;Balance/vestibular training;Cognitive remediation/compensation;Functional mobility training;Splinting/orthotics;Patient/family education;Therapeutic Exercise PT Transfers Anticipated Outcome(s): supervision PT Locomotion Anticipated Outcome(s): supervision PT Recommendation Follow Up Recommendations: Home health PT;Skilled nursing facility Patient destination: Home Equipment Recommended: To be determined  Skilled Therapeutic Intervention Pt participated in limited eval due to c/o nausea and fatigue. Pt performs supine <> sit multiple attempts due to decreased sitting balance requiring min A to sit edge of bed.  Pt c/o dizziness, BP taken in supine and sitting, not orthostatic.  Pt able to take small step fwd and bkwd with RW with mod A. Pt then unable to tolerate further treatment, RN aware. Pt left in bed with needs at hand, alarm set.  PT Evaluation Precautions/Restrictions Precautions Precautions: Fall Restrictions Weight Bearing Restrictions: No General   Vital SignsTherapy Vitals BP: (!) 146/62 Patient  Position (if appropriate): Lying  Sitting: 150/55 Pain Pain Assessment Pain Score: 0-No pain Home Living/Prior Functioning Home Living Available Help at Discharge: Family;Available 24 hours/day Type of Home: House Home Access: Ramped entrance Home Layout: One level Additional Comments: pt unable to give accurate information  Lives With: Alone Prior Function Level of Independence: Requires assistive device for independence Driving: No Comments: 6wks with walker prior to that was independent, doesn't drive  Cognition Overall Cognitive Status: No family/caregiver present to determine baseline cognitive functioning Orientation Level: Oriented to person;Disoriented to situation;Disoriented to time Awareness: Impaired Awareness Impairment: Intellectual impairment Sensation Sensation Light Touch: Appears Intact Proprioception: Impaired by gross assessment Coordination Gross Motor Movements are Fluid and Coordinated: No Coordination and Movement Description: requires cuing for functional mobility tasks due to impaired processing and motor planning Motor  Motor Motor: Abnormal postural alignment and control Motor - Skilled Clinical Observations: generalized weakness  Mobility Bed Mobility Bed Mobility: Supine to Sit;Sit to Supine Supine to Sit: Moderate Assistance - Patient 50-74% Sit to Supine: Moderate Assistance - Patient 50-74% Transfers Transfers: Sit to Stand;Stand to Sit Sit to Stand: Moderate Assistance - Patient 50-74% Stand to Sit: Moderate Assistance - Patient 50-74% Transfer (Assistive device): Rolling walker Locomotion  Gait Ambulation: Yes Gait Assistance: Moderate Assistance - Patient 50-74% Gait Distance (Feet): 2 Feet Assistive device: Rolling walker Stairs / Additional Locomotion Stairs: No Wheelchair Mobility Wheelchair Mobility: No  Trunk/Postural Assessment  Cervical Assessment Cervical Assessment: (fwd head) Thoracic Assessment Thoracic  Assessment: (kyphosis) Lumbar Assessment Lumbar Assessment: (posterior pelvic tilt) Postural Control Postural Control: Deficits on evaluation Righting Reactions: delayed and inadequate  Balance Static Sitting Balance Static Sitting - Comment/# of Minutes: min A Static Standing Balance Static Standing - Comment/# of Minutes: min A with RW Extremity Assessment      RLE Assessment General Strength Comments: grossly 3-/5 LLE Assessment General Strength Comments: grossly 3-/5    Refer to Care Plan  for Long Term Goals  Recommendations for other services: None   Discharge Criteria: Patient will be discharged from PT if patient refuses treatment 3 consecutive times without medical reason, if treatment goals not met, if there is a change in medical status, if patient makes no progress towards goals or if patient is discharged from hospital.  The above assessment, treatment plan, treatment alternatives and goals were discussed and mutually agreed upon: No family available/patient unable  Raquelle Pietro 09/20/2018, 9:19 AM

## 2018-09-20 NOTE — IPOC Note (Signed)
Overall Plan of Care [facility]) Patient Details Name: Maureen Stout MRN: [id number] DOB: [birth year]  Admitting Diagnosis: [place] [facility] Problems: Active Problems:   Debility   Acute pancreatitis   Anasarca   Anemia of chronic disease   AKI (acute kidney injury) ([place])   Stage 3 chronic kidney disease (H[facility])   Urinary retention     Functional Problem List: Nursing Bladder, Edema, Nutrition, Safety, Skin Integrity  PT Balance, Behavior, Edema, Endurance, Motor, Safety  OT Balance, Cognition, Endurance, Edema, Motor, Pain, Safety, Perception, Sensory  SLP Cognition, Linguistic  TR         Basic ADL's: OT Grooming, Bathing, Dressing, Toileting     Advanced  ADL's: OT       Transfers: PT Bed Mobility, Bed to Chair, Car, Manufacturing systems engineer, Metallurgist: PT Emergency planning/management officer, Ambulation     Additional Impairments: OT None  SLP Communication, Social Cognition comprehension, expression Social Interaction, Problem Solving, Memory, Attention, Awareness  TR      Anticipated Outcomes Item Anticipated Outcome  Self Feeding Setup  Swallowing      Basic self-care  Supervision  Toileting  Supervision   Bathroom Transfers Supervision  Bowel/Bladder  Patient will be continent of bladder and remain continent of bowel during admission  Transfers  supervision  Locomotion  supervision  Communication  Min A  Cognition  Min-Mod A   Pain  Paitient will be pain free or pain less than 3 during admission  Safety/Judgment  Patient will be free from falls and adhere to safety plan during admission   Therapy Plan: PT Intensity: Minimum of 1-2 x/day ,45 to 90 minutes PT Frequency: 5 out of 7 days PT Duration Estimated Length of Stay: 10-14 days OT Intensity: Minimum of 1-2 x/day, 45 to 90 minutes OT Frequency: Total of 15 hours over 7 days of combined therapies OT Duration/Estimated Length of Stay: 14-18 days SLP Intensity: Minumum of 1-2 x/day, 30 to 90  minutes SLP Frequency: 3 to 5 out of 7 days SLP Duration/Estimated Length of Stay: 10-14 days    Due to the current state of emergency, patients may not be receiving their 3-hours of Medicare-mandated therapy.   Team Interventions: Nursing Interventions Patient/Family Education, Skin Care/Wound Management, Bladder Management, Cognitive Remediation/Compensation  PT interventions Ambulation/gait training, Community reintegration, DME/adaptive equipment instruction, Neuromuscular re-education, Stair training, UE/LE Strength taining/ROM, Wheelchair propulsion/positioning, UE/LE Coordination activities, Therapeutic Activities, Pain management, Discharge planning, Training and development officer, Cognitive remediation/compensation, Functional mobility training, Splinting/orthotics, Patient/family education, Therapeutic Exercise  OT Interventions Training and development officer, Cognitive remediation/compensation, Community reintegration, Discharge planning, Disease mangement/prevention, DME/adaptive equipment instruction, Functional mobility training, Pain management, Patient/family education, Psychosocial support, Self Care/advanced ADL retraining, Therapeutic Activities, Therapeutic Exercise, UE/LE Strength taining/ROM, UE/LE Coordination activities  SLP Interventions Cognitive remediation/compensation, Environmental controls, Internal/external aids, Speech/Language facilitation, Therapeutic Activities, Patient/family education, Functional tasks, Cueing hierarchy  TR Interventions    SW/CM Interventions Discharge Planning, Psychosocial Support, Patient/Family Education   Barriers to Discharge MD  Medical stability and Weight  Nursing      PT Decreased caregiver support, Medical stability    OT      SLP      SW       Team Discharge Planning: Destination: PT-Home ,OT- Home , SLP-Home Projected Follow-up: PT-Home health PT, Skilled nursing facility, OT-  Home health OT, 24 hour supervision/assistance,  Skilled nursing facility, SLP-24 hour supervision/assistance, Home Health SLP Projected Equipment Needs: PT-To be determined, OT- To be determined, SLP-None recommended by SLP  Equipment Details: PT- , OT-  Patient/family involved in discharge planning: PT- Patient unable/family or caregiver not available,  OT-Patient unable/family or caregiver not available, SLP-Patient, Family member/caregiver  MD ELOS: 14-17 days. Medical Rehab Prognosis:  Good Assessment: 85 year old female with history of T2DM, PAF, CKD, enterovesicular fistula, sinus bradycardiawho was admitted on 09/06/2018 with 1 week history of dizziness and weakness.She was found to have marked bradycardia with heart rate in 30s,acute on chronic renal failure as well as elevated TSH-9.2. Dr. Martinique recommended discontinuation of amiodarone and metoprolol and AKItreated with IV fluids.She developed hypotension and abdominal pain due to urosepsis and was started on IV cefepime for treatment. CT abdomen revealed acute pancreatitis and treated with NPO and D5NS.She has had issues with confusion and agitation due to delirium. Mentation improved briefly but she developed lethargy with hypothermia felt to be due to sepsis. As bradycardia resolved, amiodarone was resumed on 4/24.Acute on chronic anemia treated with 1 unit packed red blood cells and thrombocytopenia being monitored.Follow up CT abdomen 4/27,showed no change in mild acute pancreatitis with increase in diffuse body wall edema with small to moderate bilateral pleural effusions. Anasarca monitored initially and she required transfusion with additional unit of PRBC 4/8 due to drop in H/H to 6.7/21.6. She continues to have intermittent hypothermia and mentation slowly improving. She continues to require I/O caths for urinary retention. Weights trended upwards, therefore treated with IV lasix 5/3 and 5/4. Therapy ongoing and patient noted to be debilitated with poor safety  awareness. Will set goals for Supervision with PT/OT and Min A with SLP.  See Team Conference Notes for weekly updates to the plan of care

## 2018-09-20 NOTE — Progress Notes (Signed)
Social Work  Social Work Assessment and Plan  Patient Details  Name: Maureen Stout MRN: [id number] Date of Birth: 1931/12/08  Today's Date: 09/20/2018  Problem List:  Patient Active Problem List   Diagnosis Date Noted  . Acute pancreatitis   . Anasarca   . Anemia of chronic disease   . AKI (acute kidney injury) ([place])   . Stage 3 chronic kidney disease ([place])   . Urinary retention   . Debility 09/19/2018  . Sepsis due to Enterobacter species ([place]) 09/08/2018  . Bacteremia 09/08/2018  . Hypoglycemia without diagnosis of diabetes mellitus 09/08/2018  . Acute on chronic anemia 09/07/2018  . Acute pancreatitis without infection or necrosis 09/07/2018  . Bradycardia 09/07/2018  . Sinus bradycardia 09/06/2018  . Hypothyroidism 09/06/2018  . First degree AV block 09/06/2018  . Abnormal CXR 04/13/2018  . On amiodarone therapy 01/08/2018  . Chronic anticoagulation 01/08/2018  . Hypertensive heart disease 01/08/2018  . Elevated TSH 01/08/2018  . Pressure injury of skin 04/19/2017  . Paroxysmal atrial fibrillation ([place]) 04/18/2017   Past Medical History:  Past Medical History:  Diagnosis Date  . Acute on chronic anemia 09/07/2018  . Bladder problem   . CKD (chronic kidney disease), stage III ([place])   . Diabetes mellitus ([place])   . Dyslipidemia   . Hypertension   . Paroxysmal A-fib ([place])   . Paroxysmal atrial flutter ([place])   . Sinus bradycardia    Past Surgical History:  Past Surgical History:  Procedure Laterality Date  . PARTIAL HYSTERECTOMY    . TOE SURGERY    . TONSILLECTOMY     Social History:  reports that she has never smoked. She has never used smokeless tobacco. She reports that she does not drink alcohol or use drugs.  Family / Support Systems Marital Status: Widow/Widower Patient Roles: Parent, Other (Comment)(sibling) Children: Maureen Stout-son [phone]-home  [phone] Other Supports: Maureen Stout-daughter in-law [phone]-cell   Brother and sister in-law local Anticipated  Caregiver: Son, Daughter in-law, brother and sister in-law Ability/Limitations of Caregiver: None-all retired Careers adviser: 24/7(Will arrange 24 hr care if needed) Family Dynamics: Close knit family who will pull together to assist pt. Between son and daughter in-law and pt's brother and sister in-law can piece together care. She has a few friends who check on her still.  Social History Preferred language: English Religion: None Cultural Background: No issues Education: [employer] Read: Yes Write: Yes Employment Status: Retired Public relations account executive Issues: No issues Guardian/Conservator: None-according to MD pt is capable of making her own decisions, while she is still confused will need to include her son-Maureen Stout on any decisions while here. He is her next of kin besides her brother.   Abuse/Neglect Abuse/Neglect Assessment Can Be Completed: Yes Physical Abuse: Denies Verbal Abuse: Denies Sexual Abuse: Denies Exploitation of patient/patient's resources: Denies Self-Neglect: Denies  Emotional Status Pt's affect, behavior and adjustment status: Pt realizes she is not thinking clearly and somewhat confused. She is hopeful she will improve and regain her thinking skills. She was doing well prior to hospitalization. She did have to use a rw the past six weeks due to decline. Recent Psychosocial Issues: other health issues-had decliend in the past six weeks and had to use a rw. She also has been dealing with bladder issues for awhile with PCP Psychiatric History: No history deferred depression screen due to confusion see if clears. May benefit from seeing neuro-psych while here for cog screen and coping Substance Abuse History: No issues  Patient / Family  Perceptions, Expectations & Goals Pt/Family understanding of illness & functional limitations: Pt knows she is in the hospital and that she has an infection MD are treating. SOn has spoken with the MD's on acute and feels  they are dealing with her infection and dizziness issues. He feels she is not that confused and needs time to answer or just doesn't know the answer. Premorbid pt/family roles/activities: Mom, sister, M-I-L, church member, grandmother, etc Anticipated changes in roles/activities/participation: resume Pt/family expectations/goals: Pt states: " I am so tired I need to rest. I know I can't think clearly maybe I need to rest."  Son states: " We will provide the care she needs at home."  [employer]: None Premorbid Home Care/DME Agencies: Other (Comment)(has ramp into home and has rw, bsc) Transportation available at discharge: Family has been providing this Resource referrals recommended: Neuropsychology, Support group (specify)  Discharge Planning Living Arrangements: Alone Support Systems: Children, Other relatives, Friends/neighbors, Social worker community Type of Residence: Private residence Insurance Resources: Multimedia programmer (specify)([employer] Medicare) Financial Resources: Social Security, Family Support Financial Screen Referred: No Living Expenses: Own Money Management: Patient Does the patient have any problems obtaining your medications?: No Home Management: Pt and family may help with cleaning Patient/Family Preliminary Plans: Return to her home with family Maureen Stout, daughter in-law, brother and sister in-law to assist with her care. Made son aware she will need 24 hr care at discharge from CIR. Will await therapy evaluations and work on a safe plan for her. Social Work Anticipated Follow Up Needs: HH/OP, Support Group  Clinical Impression Pleasantly confused female who is aware of where she is but not sure the reason she is here. She feels she may be in over her head on rehab. She is very tired and says "I'm old." Will need to build in rest breaks in her schedule and work on Hotel manager. Await therapy evaluations and work on discharge needs, may  benefit from seeing neuro-psych for cognitive screen.  Maureen Stout 09/20/2018, 1:46 PM

## 2018-09-21 ENCOUNTER — Inpatient Hospital Stay (HOSPITAL_COMMUNITY): Payer: Medicare PPO

## 2018-09-21 ENCOUNTER — Inpatient Hospital Stay (HOSPITAL_COMMUNITY): Payer: Medicare PPO | Admitting: Occupational Therapy

## 2018-09-21 DIAGNOSIS — G9340 Encephalopathy, unspecified: Secondary | ICD-10-CM

## 2018-09-21 LAB — COMPREHENSIVE METABOLIC PANEL
ALT: 72 U/L — ABNORMAL HIGH (ref 0–44)
AST: 50 U/L — ABNORMAL HIGH (ref 15–41)
Albumin: 2 g/dL — ABNORMAL LOW (ref 3.5–5.0)
Alkaline Phosphatase: 104 U/L (ref 38–126)
Anion gap: 13 (ref 5–15)
BUN: 20 mg/dL (ref 8–23)
CO2: 26 mmol/L (ref 22–32)
Calcium: 8.8 mg/dL — ABNORMAL LOW (ref 8.9–10.3)
Chloride: 102 mmol/L (ref 98–111)
Creatinine, Ser: 1.44 mg/dL — ABNORMAL HIGH (ref 0.44–1.00)
GFR calc Af Amer: 39 mL/min — ABNORMAL LOW (ref >60)
GFR calc non Af Amer: 33 mL/min — ABNORMAL LOW (ref >60)
Glucose, Bld: 112 mg/dL — ABNORMAL HIGH (ref 70–99)
Potassium: 3.1 mmol/L — ABNORMAL LOW (ref 3.5–5.1)
Sodium: 141 mmol/L (ref 135–145)
Total Bilirubin: 0.4 mg/dL (ref 0.3–1.2)
Total Protein: 5.1 g/dL — ABNORMAL LOW (ref 6.5–8.1)

## 2018-09-21 LAB — CBC
HCT: 24.8 % — ABNORMAL LOW (ref 36.0–46.0)
Hemoglobin: 8.1 g/dL — ABNORMAL LOW (ref 12.0–15.0)
MCH: 30.8 pg (ref 26.0–34.0)
MCHC: 32.7 g/dL (ref 30.0–36.0)
MCV: 94.3 fL (ref 80.0–100.0)
Platelets: 203 10*3/uL (ref 150–400)
RBC: 2.63 MIL/uL — ABNORMAL LOW (ref 3.87–5.11)
RDW: 17.5 % — ABNORMAL HIGH (ref 11.5–15.5)
WBC: 4.9 10*3/uL (ref 4.0–10.5)
nRBC: 0 % (ref 0.0–0.2)

## 2018-09-21 LAB — LIPASE, BLOOD: Lipase: 23 U/L (ref 11–51)

## 2018-09-21 LAB — PROCALCITONIN: Procalcitonin: <0.1 ng/mL

## 2018-09-21 LAB — AMYLASE: Amylase: 31 U/L (ref 28–100)

## 2018-09-21 MED ORDER — POTASSIUM CHLORIDE 20 MEQ/15ML (10%) PO SOLN
20.0000 meq | Freq: Every day | ORAL | Status: DC
  Filled 2018-09-21: qty 15

## 2018-09-21 MED ORDER — ENSURE ENLIVE PO LIQD
237.0000 mL | Freq: Three times a day (TID) | ORAL | Status: DC
  Administered 2018-09-22: 10:00:00 237 mL via ORAL

## 2018-09-21 MED ORDER — SODIUM CHLORIDE 0.9 % IV SOLN
INTRAVENOUS | Status: DC
  Administered 2018-09-21: 13:00:00 via INTRAVENOUS
  Administered 2018-09-22: 75 mL/h via INTRAVENOUS

## 2018-09-21 NOTE — Progress Notes (Signed)
Speech Language Pathology Daily Session Note  Patient Details  Name: Maureen Stout MRN: [id number] Date of Birth: 1931-11-17  Today's Date: 09/21/2018 SLP Individual Time: 1000-1025 SLP Individual Time Calculation (min): 25 min  Short Term Goals: Week 1: SLP Short Term Goal 1 (Week 1): Patient will demonstrate sustained attention to functional tasks for 30 minutes with Min A verbal cues for redirection SLP Short Term Goal 2 (Week 1): Patient will demonstrate functional problem solving for basic and familair tasks with Mod A verbal cues.  SLP Short Term Goal 3 (Week 1): Patient will utilize external aids to recall new, daily information with Max A multimodal cues.  SLP Short Term Goal 4 (Week 1): Patient wil follow multi-step commands with Mod A verbal and visual cues.  SLP Short Term Goal 5 (Week 1): Patient will verbalize basic information/wants and needs at the phrase level with Mod A verbal cues for word-finding.   Skilled Therapeutic Interventions: Skilled ST services focused on cogntive skills. Pt was severally lethargic during session falling asleep on and off. SLP facilitated basic problem solving and sustained attention skills utilizing sorting change, pt required extensive extra time in verbal and functional response to request, stating "hold on" and "wait a minute" then a 3-5 second delay and ability to sort with max A verbal cues for each type of coin. Pt missed 5 minutes of session due to lethargy/fatigue. Pt was left in room with call bell within reach and chair alarm set. ST recommends to continue skilled ST services.      Pain Pain Assessment Pain Score: 0-No pain  Therapy/Group: Individual Therapy  Granvil Djordjevic  [facility] 09/21/2018, 12:27 PM

## 2018-09-21 NOTE — Progress Notes (Signed)
Chart reviewed. Patient continues to have bouts of lethargy and remains confused. She did get trazodone past 2 night and this in setting of minimal intake likely the cause of lethargy today. Will d/c trazodone. Labs look stable--will order CT head as she has had intermittent issues with somnolence on acute---4/23, 4/26, 4/27.  Pancreatitis has resolved but she does not have any appetite and is constantly nauseous and question SE of amiodarone. Will contact cardiology for input.

## 2018-09-21 NOTE — Progress Notes (Signed)
Maureen Stout PHYSICAL MEDICINE & REHABILITATION PROGRESS NOTE  Subjective/Complaints: Patient seen sitting up in bed this morning.  She slept fairly overnight.  Discussed with nursing, patient with increased confusion.  Also noted to have questionable bladder prolapse overnight with inconsistent bladder scans.  This morning patient has some word finding difficulties with increased confusion.  ROS: Denies CP, SOB, N/V/D,?  Reliability  Objective: Vital Signs: Blood pressure (!) 163/83, pulse 68, temperature 98.1 F (36.7 C), resp. rate 15, height 5\' 6"  (1.676 m), weight 83.7 kg, SpO2 95 %. No results found. Recent Labs    09/19/18 0509 09/20/18 0336  WBC 5.3 4.7  HGB 7.6* 7.5*  HCT 24.3* 23.7*  PLT 188 190   Recent Labs    09/19/18 0509 09/20/18 0336  NA 139 138  K 3.8 3.6  CL 105 105  CO2 27 25  GLUCOSE 117* 115*  BUN 29* 25*  CREATININE 1.52* 1.49*  CALCIUM 8.5* 8.4*    Physical Exam: BP (!) 163/83 (BP Location: Left Arm)   Pulse 68   Temp 98.1 F (36.7 C)   Resp 15   Ht 5\' 6"  (1.676 m)   Wt 83.7 kg   SpO2 95%   BMI 29.78 kg/m  Constitutional: No distress . Vital signs reviewed. HENT: Normocephalic.  Atraumatic. Eyes: EOMI. No discharge. Cardiovascular: No JVD. Respiratory: Normal effort. GI: Non-distended. Musculoskeletal: Generalized edema Neurological:  Alert and oriented x1 with word finding difficulties Cooperative.  Motor: Grossly 4-/5 throughout, unchanged No obvious asterixis Skin: Frail skin. Psychiatric: Confused  Assessment/Plan: 1. Functional deficits secondary to debility and encephalopathy which require 3+ hours per day of interdisciplinary therapy in a comprehensive inpatient rehab setting.  Physiatrist is providing close team supervision and 24 hour management of active medical problems listed below.  Physiatrist and rehab team continue to assess barriers to discharge/monitor patient progress toward functional and medical goals  Care  Tool:  Bathing  Bathing activity did not occur: Refused           Bathing assist       Upper Body Dressing/Undressing Upper body dressing Upper body dressing/undressing activity did not occur (including orthotics): Refused What is the patient wearing?: Hospital gown only    Upper body assist Assist Level: Moderate Assistance - Patient 50 - 74%    Lower Body Dressing/Undressing Lower body dressing    Lower body dressing activity did not occur: Refused What is the patient wearing?: Hospital gown only, Incontinence brief     Lower body assist Assist for lower body dressing: Maximal Assistance - Patient 25 - 49%     Toileting Toileting    Toileting assist Assist for toileting: Total Assistance - Patient < 25%     Transfers Chair/bed transfer  Transfers assist     Chair/bed transfer assist level: Maximal Assistance - Patient 25 - 49%     Locomotion Ambulation   Ambulation assist      Assist level: Moderate Assistance - Patient 50 - 74%   Max distance: 2   Walk 10 feet activity   Assist  Walk 10 feet activity did not occur: Safety/medical concerns        Walk 50 feet activity   Assist Walk 50 feet with 2 turns activity did not occur: Safety/medical concerns         Walk 150 feet activity   Assist Walk 150 feet activity did not occur: Safety/medical concerns         Walk 10 feet on uneven surface  activity   Assist Walk 10 feet on uneven surfaces activity did not occur: Safety/medical concerns         Wheelchair     Assist Will patient use wheelchair at discharge?: No             Wheelchair 50 feet with 2 turns activity    Assist            Wheelchair 150 feet activity     Assist            Medical Problem List and Plan: 1.Functional deficits and weaknesssecondary to debility and encephalopathy after urosepsis/pancreatitis and multiple medical issues  Cont CIR, made 15/7  Patient with  increased confusion and worsening encephalopathy, stat labs ordered  Team conference today to discuss current and goals and coordination of care, home and environmental barriers, and discharge planning with nursing, case manager, and therapies.  2. Antithrombotics: -PAF/DVT/anticoagulation:Pharmaceutical:Other (comment)- Eliquis -antiplatelet therapy:  3. Pain Management:Tylenol prn. 4. Mood:LCSW to follow for evaluation and support. -antipsychotic agents: N/A 5. Neuropsych: This patientisnot fully capable of making decisions on herown behalf. 6. Skin/Wound Care:Routine pressure relief measures. 7. Fluids/Electrolytes/Nutrition:Strict I/O.   Poor PO intake, ?related to nausea - will consider scheduling antiemetics if necessary-we will hold off for the time being given AMS  IVF initiated, will consult dietitian, may need to consider TPN if necessary 8. Enterococcus bacteremia/UTI: Continue Maxipime D# 14/14-discussed with pharmacy, continue today.  Communication exchange with primary service as well, patient to start amoxicillin 500 mg nightly starting tomorrow. 9. Anasarca/FLuid overload: Added heart healthy restrictions. Weight daily. Lasix daily prn depending stability of weights/symptoms.  Filed Weights   09/19/18 1746 09/20/18 0356  Weight: 87.3 kg 83.7 kg   ?Reliability on 5/5 10. PAF: Monitor HR bid--back on amiodarone and Eliquis. Off metoprolol due to bradycardia. 23. Urinary retention with recurrent UTIs:   PVRs showing retention, however inconsistent readings and asynchronous with I/O caths  ?  Bladder prolapse, will follow-up  Amoxicillin 500 mg nightly started on 5/7 per IM  Cath for volumes > 350 cc. 12. Acute on Chronic renal failure: SCr- 1.32 on 04/2018. Continue to monitor with routine checks.   Cr 1.49 on 5/5, labs ordered for today 13. Delirium: Resolving.  14. Anemia of chronic disease: Baseline Hg around 9.0? Likely  dropafterhydration.   Monitor for signs of bleeding.   Hemoglobin 7.5 on 5/5, labs ordered for today  Continue to monitor 15. Acute pancreatitis: Offer supplements with meals.   LFTs elevated on 5/5, labs ordered for today  Amylase/lipase ordered  Continue to monitor 16. Hypothyroidism: Now synthroid 75 mcg/day.  LOS: 2 days A FACE TO FACE EVALUATION WAS PERFORMED  Maureen Stout Maureen Stout 09/21/2018, 8:08 AM

## 2018-09-21 NOTE — Progress Notes (Signed)
Physical Therapy Session Note  Patient Details  Name: [name] MRN: [id number] Date of Birth: March 20, 1932  Today's Date: 09/21/2018 PT Individual Time:Session1: 3300-7622; Session2: 6333 -1133 PT Individual Time Calculation (min): 49 min & 28 min  Short Term Goals: Week 1:  PT Short Term Goal 1 (Week 1): pt will perform functional transfers with min A PT Short Term Goal 2 (Week 1): pt will perform gait x 15' in controlled environment with min A  Skilled Therapeutic Interventions/Progress Updates:    Session1: Patient in supine and arousable, but lethargic.  Performed supine to sit mod A with cues and using rail.  Sit to stand and stand pivot to w/c with RW mod to max A due to knees flexed and leaning back.  Attempting to get back to bed until cues to scoot over in chair.  Assisted in w/c to sink to perform sit to stand with min to mod A and standing for perineal hygiene, but pt wasn't soiled so sat to replace brief.  Patient washed hair and combed hair at sink with set up to min A.  Patient assisted in w/c to therapy gym.  Sit to stand in parallel bars ambulated 8' with min to mod A turning in bars and turning to sit at w/c.  Patient sat to rest, again stood, but reported unable to take steps.  Returned to room to check orthostatic BP.  See results below.  Assisted to stand pivot with RW to recliner mod A.  Left sitting in recliner with feet elevated and seat alarm activated with call bell in reach.    [place]:  Patient in recliner and more alert, but still with some difficulty expressing herself with incomplete questions.  Performed seated therex consisting of hip flexion, LAQ, ankle DF/PF, seated trunk flexion/extension without UE support, and sit<>stand x 5 with back of chair in front for support when standing and rest in between reps due to c/o nausea x 1.  Pt attempting to step last rep so stood again with RW and ambulated about 5' forward and 5' back.  Left reclined in recliner with seat alarm  activated and call bell in reach, RN in room.   Orthostatic VS for the past 24 hrs (Last 3 readings):  BP- Sitting Pulse- Sitting BP- Standing at 0 minutes Pulse- Standing at 0 minutes  09/21/18 0900 163/63 66 (!) 125/98 71    Therapy Documentation Precautions:  Precautions Precautions: Fall Restrictions Weight Bearing Restrictions: No Pain: Pain Assessment Pain Score: 0-No pain    Therapy/Group: Individual Therapy  Reginia Naas  Magda Kiel, PT 09/21/2018, 11:27 AM

## 2018-09-21 NOTE — Progress Notes (Signed)
Initial Nutrition Assessment  RD working remotely.  DOCUMENTATION CODES:   Not applicable  INTERVENTION:   - Increase Ensure Enlive po to TID, each supplement provides 350 kcal and 20 grams of protein  - Add Magic Cup TID with meals, each supplement provides 290 kcal and 9 grams of protein  NUTRITION DIAGNOSIS:   Inadequate oral intake related to lethargy/confusion, poor appetite as evidenced by meal completion < 50%.  GOAL:   Patient will meet greater than or equal to 90% of their needs  MONITOR:   PO intake, Supplement acceptance, Labs, Weight trends, I & O's  REASON FOR ASSESSMENT:   Consult Assessment of nutrition requirement/status  ASSESSMENT:   85 year old female with PMH of T2DM, PAF, CKD, enterovesicular fistula, sinus bradycardiawho was admitted on 09/06/18 with 1 week history of dizziness and weakness. Pt was found to have marked bradycardia,acute on chronic renal failure as well as elevated TSH. Pt developed hypotension and abdominal pain due to urosepsis and was started on IV abx for treatment. CT abdomen revealed acute pancreatitis and treated with NPO and D5NS. Pt has had issues with confusion and agitation due to delirium. Mentation improved briefly but she developed lethargy with hypothermia felt to be due to sepsis. Follow-up CT abdomen 4/27 showed no change in mild acute pancreatitis with increase in diffuse body wall edema with small to moderate bilateral pleural effusions. Pt continues to have intermittent hypothermia and mentation slowly improving. Pt admitted to CIR on 5/04.  Per P. Love, PA note today: "Patient continues to have bouts of lethargy and remains confused. She did get trazodone past 2 night and this in setting of minimal intake likely the cause of lethargy today. Will d/c trazodone...pancreatitis has resolved but she does not have any appetite and is constantly nauseous and question SE of amiodarone. Will contact cardiology for input."  Did  not attempt to call pt in room today as therapy notes indicate pt has remained lethargic and confused throughout the day. Will attempt to speak with pt in person to obtain diet and weight history.  Reviewed weight history in chart. Pt's weight has remained stable over the last year. Weight today is down 8 lbs from weight yesterday. Suspect this is related to scale error vs negative fluid balance. Will continue to monitor trends.  Reviewed RN edema assessment. Pt with moderate pitting generalized edema.  Meal Completion: 0-25% x 3 meals  Medications reviewed and include: Oscal with D, Ensure Enlive BID, Miralax, IV ab IVF: NS @ 75 ml/hr  Labs reviewed: potassium 3.1 (L), creatinine 1.44 (H), elevated LFTs, hemoglobin 8.1 (L)  UOP: 1775 ml x 24 hours I/O's: -2.6 L since admit  NUTRITION - FOCUSED PHYSICAL EXAM:  Unable to complete at this time. RD working remotely.  Diet Order:   Diet Order            DIET SOFT Room service appropriate? No; Fluid consistency: Thin  Diet effective now              EDUCATION NEEDS:   Not appropriate for education at this time  Skin:  Skin Assessment: Skin Integrity Issues: Incisions: right foot, left food (both present on admission) Other: MASD to buttocks, perineum, and vagina; non-pressure wound to right arm  Last BM:  09/18/18  Height:   Ht Readings from Last 1 Encounters:  09/19/18 5\' 6"  (1.676 m)    Weight:   Wt Readings from Last 1 Encounters:  09/20/18 83.7 kg    Ideal Body Weight:  59.1 kg  BMI:  Body mass index is 29.78 kg/m.  Estimated Nutritional Needs:   Kcal:  1550-1750  Protein:  80-95 grams  Fluid:  >/= 1.6 L    Gaynell Face, MS, RD, LDN Inpatient Clinical Dietitian Pager: [phone] Weekend/After Hours: [phone]

## 2018-09-21 NOTE — Treatment Plan (Signed)
Called regarding consult from rehab.   On review of chart, pt just discharged from internal medicine teaching service on 5/4. I discussed with Reesa Chew, recommendation to call teaching service for consult. Please do not hesitate to call hospitalist back if any questions or concerns.

## 2018-09-21 NOTE — Progress Notes (Signed)
Occupational Therapy Session Note  Patient Details  Name: Maureen Stout MRN: [id number] Date of Birth: Aug 05, 1931  Today's Date: 09/21/2018 OT Individual Time: 1230-1310 OT Individual Time Calculation (min): 40 min    Short Term Goals: Week 1:  OT Short Term Goal 1 (Week 1): Pt will complete bathing at sit > stand level with min assist OT Short Term Goal 2 (Week 1): Pt will complete toilet transfers with min assist  OT Short Term Goal 3 (Week 1): Pt will complete LB dressing with min assist OT Short Term Goal 4 (Week 1): Pt will maintain sitting balance at EOB 5 mins with supervision to engage in self-care tasks  Skilled Therapeutic Interventions/Progress Updates:    Treatment session with focus on activity tolerance and standing balance.  Pt received supine in bed agreeable to therapy session  Pt completed bed mobility with min-mod assist and maintained sitting balance at EOB with supervision while RN started IV fluids.  Pt refused bathing/dressing and refused to eat any lunch.  Agreeable to transfer OOB.  Pt completed sit > stand mod assist and transferred min-mod assist with mod facilitation and cues for stand step transfer to recliner.  Engaged in standing activity with focus on balance and activity tolerance.  Pt able to complete sit > stand from recliner mod faded to min throughout session.  Pt demonstrated difficulty following simple commands with pattern replication and demonstrating confusion and inability to problem solve use of phone when it rang.  Pt left upright in recliner with legs elevated and chair alarm on and call bell in reach.  Therapy Documentation Precautions:  Precautions Precautions: Fall Restrictions Weight Bearing Restrictions: No General:   Vital Signs: Therapy Vitals Temp: (!) 94.4 F (34.7 C) Pulse Rate: 62 Resp: 12 BP: (!) 152/66 Patient Position (if appropriate): Sitting Oxygen Therapy SpO2: 97 % O2 Device: Room Air Pain: Pain Assessment Pain Score:  0-No pain   Therapy/Group: Individual Therapy  Simonne Come 09/21/2018, 3:23 PM

## 2018-09-21 NOTE — Progress Notes (Signed)
Pt temp upon arrival to shift was 95.6 Continued to monitor, pt temp currently at 97.7 Pt covered with warm blankets. Heating pad turned off. Wil continue to monitor temp and assess for need of heating pad to be turned back on.

## 2018-09-21 NOTE — Progress Notes (Signed)
At 1400 patient temp was 94.4. Applied blanket and notified PA. Orders given to apply bair hugger. Placed warmed blankets and rechecked and the Temp was 94 at 1700. Waiting on bair hugger at this time. Will recheck and monitor.

## 2018-09-22 ENCOUNTER — Inpatient Hospital Stay (HOSPITAL_COMMUNITY): Payer: Medicare PPO

## 2018-09-22 ENCOUNTER — Inpatient Hospital Stay (HOSPITAL_COMMUNITY): Payer: Medicare PPO | Admitting: Occupational Therapy

## 2018-09-22 ENCOUNTER — Inpatient Hospital Stay (HOSPITAL_COMMUNITY): Payer: Medicare PPO | Admitting: Speech Pathology

## 2018-09-22 ENCOUNTER — Inpatient Hospital Stay (HOSPITAL_COMMUNITY): Payer: Medicare PPO | Admitting: Physical Therapy

## 2018-09-22 ENCOUNTER — Other Ambulatory Visit: Payer: Self-pay | Admitting: Internal Medicine

## 2018-09-22 DIAGNOSIS — I951 Orthostatic hypotension: Secondary | ICD-10-CM

## 2018-09-22 DIAGNOSIS — N183 Chronic kidney disease, stage 3 unspecified: Secondary | ICD-10-CM

## 2018-09-22 DIAGNOSIS — N390 Urinary tract infection, site not specified: Secondary | ICD-10-CM

## 2018-09-22 DIAGNOSIS — R6251 Failure to thrive (child): Secondary | ICD-10-CM

## 2018-09-22 DIAGNOSIS — E876 Hypokalemia: Secondary | ICD-10-CM

## 2018-09-22 LAB — CBC
HCT: 24.7 % — ABNORMAL LOW (ref 36.0–46.0)
Hemoglobin: 7.9 g/dL — ABNORMAL LOW (ref 12.0–15.0)
MCH: 30 pg (ref 26.0–34.0)
MCHC: 32 g/dL (ref 30.0–36.0)
MCV: 93.9 fL (ref 80.0–100.0)
Platelets: 196 10*3/uL (ref 150–400)
RBC: 2.63 MIL/uL — ABNORMAL LOW (ref 3.87–5.11)
RDW: 17.9 % — ABNORMAL HIGH (ref 11.5–15.5)
WBC: 5.1 10*3/uL (ref 4.0–10.5)
nRBC: 0 % (ref 0.0–0.2)

## 2018-09-22 LAB — PROCALCITONIN: Procalcitonin: <0.1 ng/mL

## 2018-09-22 LAB — BASIC METABOLIC PANEL
Anion gap: 12 (ref 5–15)
BUN: 17 mg/dL (ref 8–23)
CO2: 25 mmol/L (ref 22–32)
Calcium: 8.6 mg/dL — ABNORMAL LOW (ref 8.9–10.3)
Chloride: 104 mmol/L (ref 98–111)
Creatinine, Ser: 1.51 mg/dL — ABNORMAL HIGH (ref 0.44–1.00)
GFR calc Af Amer: 36 mL/min — ABNORMAL LOW (ref >60)
GFR calc non Af Amer: 31 mL/min — ABNORMAL LOW (ref >60)
Glucose, Bld: 78 mg/dL (ref 70–99)
Potassium: 3.2 mmol/L — ABNORMAL LOW (ref 3.5–5.1)
Sodium: 141 mmol/L (ref 135–145)

## 2018-09-22 LAB — AMMONIA: Ammonia: 13 umol/L (ref 9–35)

## 2018-09-22 MED ORDER — SODIUM CHLORIDE 0.9 % IV SOLN
INTRAVENOUS | Status: DC
  Administered 2018-09-22: 20:00:00 75 mL/h via INTRAVENOUS

## 2018-09-22 MED ORDER — IOHEXOL 300 MG/ML  SOLN
10.0000 mL | Freq: Once | INTRAMUSCULAR | Status: AC | PRN
  Administered 2018-09-22: 10 mL via ORAL

## 2018-09-22 MED ORDER — PRO-STAT SUGAR FREE PO LIQD: 30.0000 mL | Freq: Two times a day (BID) | ORAL | Status: DC

## 2018-09-22 MED ORDER — LIDOCAINE VISCOUS HCL 2 % MT SOLN
6.0000 mL | Freq: Once | OROMUCOSAL | Status: AC
  Administered 2018-09-22: 6 mL via OROMUCOSAL
  Filled 2018-09-22: qty 15

## 2018-09-22 MED ORDER — POTASSIUM CHLORIDE 20 MEQ/15ML (10%) PO SOLN
30.0000 meq | Freq: Every day | ORAL | Status: DC
  Administered 2018-09-23 – 2018-09-26 (×3): 30 meq via ORAL
  Administered 2018-09-27: 08:00:00 20 meq via ORAL
  Filled 2018-09-22 (×4): qty 30

## 2018-09-22 MED ORDER — PRO-STAT SUGAR FREE PO LIQD
30.0000 mL | Freq: Two times a day (BID) | ORAL | Status: DC
  Administered 2018-09-22 – 2018-09-29 (×12): 30 mL
  Filled 2018-09-22 (×13): qty 30

## 2018-09-22 MED ORDER — POTASSIUM CHLORIDE IN NACL 20-0.9 MEQ/L-% IV SOLN
INTRAVENOUS | Status: DC
  Administered 2018-09-23: via INTRAVENOUS
  Filled 2018-09-22: qty 1000

## 2018-09-22 MED ORDER — FREE WATER
100.0000 mL | Freq: Three times a day (TID) | Status: DC
  Administered 2018-09-23 – 2018-09-28 (×13): 100 mL

## 2018-09-22 MED ORDER — OSMOLITE 1.5 CAL PO LIQD: 237.0000 mL | Freq: Four times a day (QID) | ORAL | Status: DC

## 2018-09-22 MED ORDER — OSMOLITE 1.5 CAL PO LIQD
1000.0000 mL | ORAL | Status: DC
  Administered 2018-09-22: 1000 mL
  Filled 2018-09-22 (×2): qty 1000

## 2018-09-22 MED ORDER — OSMOLITE 1.5 CAL PO LIQD
237.0000 mL | Freq: Four times a day (QID) | ORAL | Status: DC
  Filled 2018-09-22 (×3): qty 237

## 2018-09-22 NOTE — Progress Notes (Signed)
Occupational Therapy Note  Patient Details  Name: EMMARY CULBREATH MRN: [id number] Date of Birth: [birth year]  Today's Date: 09/22/2018 OT Missed Time: 26 Minutes Missed Time Reason: Patient fatigue;Patient unwilling/refused to participate without medical reason  Pt missed 30 mins scheduled OT treatment session secondary to confusion and restlessness.  Attempted to reorient pt to time, place, and situation as pt with increased confusion and tangential, illogical speech.  Therapist attempted to reposition pt in bed to allow for increased participation, however pt demonstrating difficulty following simple commands.  Pt maintained eyes closed majority of attempts, therefore pt left in supine in bed with all needs in reach.  Simonne Come 09/22/2018, 12:56 PM

## 2018-09-22 NOTE — Progress Notes (Signed)
Physical Therapy Session Note  Patient Details  Name: Maureen Stout MRN: [id number] Date of Birth: Dec 18, 1931  Today's Date: 09/22/2018 PT Individual Time: 0802-0902 PT Individual Time Calculation (min): 60 min   Short Term Goals: Week 1:  PT Short Term Goal 1 (Week 1): pt will perform functional transfers with min A PT Short Term Goal 2 (Week 1): pt will perform gait x 15' in controlled environment with min A   Skilled Therapeutic Interventions/Progress Updates:   Pt received supine in bed and agreeable to PT. Supine>sit transfer with max assist, as Pt pulling on PT's shoulders regardless of cues for use of bed features. Sitting balance EOB with min assist initially progressing to supervision assist. Orthostatic vital signs assessed by PT.  Supine:163/72 Sitting. 151/66 Partial stand: 123/73 ( unable to remain standing for entire BP check)  return to sitting 135/73 Temp monitored through rectal thermometer 97.5 deg F throught treatment.    Attempted stand pivot transfer with RW, but Bil knees buckled requiring PT to return pt to EOB. Squat pivot with max assist and max multimodal cues for set up and sequencing of movement.   Attempted to feed pt sitting in WC pt responds yes to wanting to try each food or drink except milk or oatmeal. Pt noted to have difficulty initiating reach for fork or spoon. Multimodal cues for attention to eating task for PT to spoon feed pt and awareness of food on utensil. Pt gagged and spit out all food immediately once in mouth.  Able to take 2 sips of water and 3 sips of ensure before refusing any additional food.  NT reports need for in/out cath. Pt performed squat pivot transfer to bed with max-total assist. Sit>supine completed with +2 assist for trunk and LE control, and left supine in bed with call bell in reach and all needs met.          Therapy Documentation Precautions:  Precautions Precautions: Fall Restrictions Weight Bearing Restrictions:  No Vital Signs: Therapy Vitals Temp: (!) 97.2 F (36.2 C) Temp Source: Rectal Pulse Rate: 73 Resp: 18 BP: (!) 150/70 Patient Position (if appropriate): Lying Oxygen Therapy SpO2: 92 % O2 Device: Room Air Pain: Faces: hurts a little.    Therapy/Group: Individual Therapy  [name] 09/22/2018, 9:41 AM

## 2018-09-22 NOTE — Progress Notes (Signed)
Speech Language Pathology Daily Session Note  Patient Details  Name: Maureen Stout MRN: [id number] Date of Birth: [birth year]  Today's Date: 09/22/2018 SLP Individual Time: 1884-1660 SLP Individual Time Calculation (min): 10 min and Today's Date: 09/22/2018 SLP Missed Time: 35 Minutes Missed Time Reason: Patient fatigue  Short Term Goals: Week 1: SLP Short Term Goal 1 (Week 1): Patient will demonstrate sustained attention to functional tasks for 30 minutes with Min A verbal cues for redirection SLP Short Term Goal 2 (Week 1): Patient will demonstrate functional problem solving for basic and familair tasks with Mod A verbal cues.  SLP Short Term Goal 3 (Week 1): Patient will utilize external aids to recall new, daily information with Max A multimodal cues.  SLP Short Term Goal 4 (Week 1): Patient wil follow multi-step commands with Mod A verbal and visual cues.  SLP Short Term Goal 5 (Week 1): Patient will verbalize basic information/wants and needs at the phrase Stout with Mod A verbal cues for word-finding.   Skilled Therapeutic Interventions: Skilled treatment session focused on cognitive goals. Upon arrival, patient was awake but lethargic. Patient unable to follow commands and demonstrated language of confusion and consistently thinking she knew this clinician and calling her "Mechele Claude." Due to patient inability to participate effectively and fatigue, patient missed remaining 35 minutes of session. RN aware. Patient left upright in bed with alarm on and all needs within reach. Continue with current plan of care.      Pain Pain Assessment Pain Scale: Faces Faces Pain Scale: No hurt  Therapy/Group: Individual Therapy  Rolla Servidio 09/22/2018, 2:06 PM

## 2018-09-22 NOTE — Progress Notes (Signed)
Updated patient's son on patient's condition--she got hypothermic again last last pm requiring bear hugger. Po intake has been 0-20% thorough out her stay per records and her need for nutrition. He reports that she has had issues with dizziness for a couple of months prior to admission as well as poor intake for the past month. Discussed trial of TF to see if she improves mentation/activity tolerance. If she does not rally, palliative care might be needed to help decide on [place].   He had questions about her need to be on acute--discussed that she was cleared for discharge and that we will consult them to follow along of  Input. If needed she can be transferred back. He is agreeable with this plan--orders written for placement of feeding tube.

## 2018-09-22 NOTE — Progress Notes (Signed)
Notified on call provider, Algis Liming PA that patient pulled Cortek out. Order to start IV fluids at 68ml/hr continuous. Patient to have Cortek placed tomorrow per PA request. Will continue to monitor patient and initiate order.

## 2018-09-22 NOTE — Progress Notes (Signed)
Occupational Therapy Session Note  Patient Details  Name: Maureen Stout MRN: [id number] Date of Birth: 1931/11/26  Today's Date: 09/22/2018 OT Individual Time: 1050-1115 OT Individual Time Calculation (min): 25 min    Short Term Goals: Week 1:  OT Short Term Goal 1 (Week 1): Pt will complete bathing at sit > stand level with min assist OT Short Term Goal 2 (Week 1): Pt will complete toilet transfers with min assist  OT Short Term Goal 3 (Week 1): Pt will complete LB dressing with min assist OT Short Term Goal 4 (Week 1): Pt will maintain sitting balance at EOB 5 mins with supervision to engage in self-care tasks  Skilled Therapeutic Interventions/Progress Updates:    Upon entering the room, pt had the phone up to her ear upside down and off. She was talking into the phone in a unclear, unlogical manner, repeatedly saying thank you.   Introduced self to patient and asked who she was talking to and her answer was not clear. Pt then stated she was very afraid and when asked what she was afraid of she said "Posey Pronto" but did not answer who that is.  Attempted several times to have pt sit to EOB to bathe, but she kept laying back down and closing her eyes.  Pt scooted down to EOB and needed +2 A to be moved up in bed. Pt kept closing eyes.  Left laying in side lying with pillows between bed. Bed alarm set.  Therapy Documentation Precautions:  Precautions Precautions: Fall Restrictions Weight Bearing Restrictions: No    Vital Signs: Therapy Vitals Temp: (!) 97.2 F (36.2 C) Temp Source: Rectal Pain: Pain Assessment Pain Scale: Faces Faces Pain Scale: No hurt   Therapy/Group: Individual Therapy  [place] 09/22/2018, 12:15 PM

## 2018-09-22 NOTE — Progress Notes (Addendum)
[place] PHYSICAL MEDICINE & REHABILITATION PROGRESS NOTE  Subjective/Complaints: Patient seen laying in bed this morning.  Discussed with nursing-patient appears to have slept overnight.  She was hypothermic and required [facility].  Patient repeats "yeah" to all conversation.  Discussed with therapies as well.  ROS: Unable to assess due to cognition.  Objective: Vital Signs: Blood pressure (!) 150/70, pulse 73, temperature 97.8 F (36.6 C), resp. rate 18, height 5\' 6"  (1.676 m), weight 81.5 kg, SpO2 92 %. Ct Head Wo Contrast  Result Date: 09/21/2018 CLINICAL DATA:  Altered level of consciousness. EXAM: CT HEAD WITHOUT CONTRAST TECHNIQUE: Contiguous axial images were obtained from the base of the skull through the vertex without intravenous contrast. COMPARISON:  None. FINDINGS: Brain: No evidence of acute infarction, hemorrhage, hydrocephalus, extra-axial collection or mass lesion/mass effect. Vascular: No hyperdense vessel or unexpected calcification. Skull: Intact.  No focal lesion. Sinuses/Orbits: Status post cataract surgery.  Otherwise negative. Other: None. IMPRESSION: Negative head CT. Electronically Signed   By: Inge Rise M.D.   On: 09/21/2018 13:15   Recent Labs    09/20/18 0336 09/21/18 0817  WBC 4.7 4.9  HGB 7.5* 8.1*  HCT 23.7* 24.8*  PLT 190 203   Recent Labs    09/21/18 0817 09/22/18 0516  NA 141 141  K 3.1* 3.2*  CL 102 104  CO2 26 25  GLUCOSE 112* 78  BUN 20 17  CREATININE 1.44* 1.51*  CALCIUM 8.8* 8.6*    Physical Exam: BP (!) 150/70 (BP Location: Left Arm)   Pulse 73   Temp 97.8 F (36.6 C)   Resp 18   Ht 5\' 6"  (1.676 m)   Wt 81.5 kg   SpO2 92%   BMI 29.00 kg/m  Constitutional: No distress . Vital signs reviewed. HENT: Normocephalic.  Atraumatic. Eyes: EOMI. No discharge. Cardiovascular: No JVD. Respiratory: Normal effort. GI: Non-distended. Musculoskeletal: Generalized edema Neurological:  Alert Intermittently cooperative.   Motor: Grossly 4-/5 throughout, appears to be unchanged Skin: Frail skin. Psychiatric: Perseverative  Assessment/Plan: 1. Functional deficits secondary to debility and encephalopathy which require 3+ hours per day of interdisciplinary therapy in a comprehensive inpatient rehab setting.  Physiatrist is providing close team supervision and 24 hour management of active medical problems listed below.  Physiatrist and rehab team continue to assess barriers to discharge/monitor patient progress toward functional and medical goals  Care Tool:  Bathing  Bathing activity did not occur: Refused Body parts bathed by patient: Face         Bathing assist Assist Level: Supervision/Verbal cueing     Upper Body Dressing/Undressing Upper body dressing Upper body dressing/undressing activity did not occur (including orthotics): Refused What is the patient wearing?: Hospital gown only    Upper body assist Assist Level: Maximal Assistance - Patient 25 - 49%    Lower Body Dressing/Undressing Lower body dressing    Lower body dressing activity did not occur: Refused What is the patient wearing?: Hospital gown only     Lower body assist Assist for lower body dressing: Maximal Assistance - Patient 25 - 49%     Toileting Toileting    Toileting assist Assist for toileting: Total Assistance - Patient < 25%     Transfers Chair/bed transfer  Transfers assist     Chair/bed transfer assist level: Moderate Assistance - Patient 50 - 74%     Locomotion Ambulation   Ambulation assist      Assist level: Moderate Assistance - Patient 50 - 74% Assistive device: Walker-rolling  Max distance: 5(8' in parallel bars)   Walk 10 feet activity   Assist  Walk 10 feet activity did not occur: Safety/medical concerns        Walk 50 feet activity   Assist Walk 50 feet with 2 turns activity did not occur: Safety/medical concerns         Walk 150 feet activity   Assist Walk 150  feet activity did not occur: Safety/medical concerns         Walk 10 feet on uneven surface  activity   Assist Walk 10 feet on uneven surfaces activity did not occur: Safety/medical concerns         Wheelchair     Assist Will patient use wheelchair at discharge?: No             Wheelchair 50 feet with 2 turns activity    Assist            Wheelchair 150 feet activity     Assist            Medical Problem List and Plan: 1.Functional deficits and weaknesssecondary to debility and encephalopathy after urosepsis/pancreatitis and multiple medical issues  Cont CIR, made 15/7  Encephalopathy appears to be labile per chart review, however no improvement since admission   CT head reviewed, unremarkable for acute intracranial process  Will discuss with teaching service for multiple medical issues-await recs 2. Antithrombotics: -PAF/DVT/anticoagulation:Pharmaceutical:Other (comment)- Eliquis -antiplatelet therapy:  3. Pain Management:Tylenol prn. 4. Mood:LCSW to follow for evaluation and support. -antipsychotic agents: N/A 5. Neuropsych: This patientisnot fully capable of making decisions on herown behalf. 6. Skin/Wound Care:Routine pressure relief measures. 7. Fluids/Electrolytes/Nutrition:Strict I/O.   Extremely PO intake, ?related to nausea - will consider scheduling antiemetics if necessary-we will hold off for the time being given AMS.  Will attempt to place NG and follow-up for tolerance to feeds  IVF initiated, will consult dietitian, may need to consider TPN if necessary 8. Enterococcus bacteremia/UTI: Completed 14-day course of Maxipime   Amoxicillin 500 mg nightly started on 5/7 9. Anasarca/FLuid overload: Added heart healthy restrictions. Weight daily. Lasix daily prn depending stability of weights/symptoms.  Filed Weights   09/19/18 1746 09/20/18 0356 09/22/18 0500  Weight: 87.3 kg 83.7 kg 81.5 kg    ?Reliability on 5/7 10. PAF: Monitor HR bid--back on amiodarone and Eliquis. Off metoprolol due to bradycardia. 13. Urinary retention with recurrent UTIs:   PVRs showing retention, however inconsistent readings and asynchronous with I/O caths  ?  Bladder prolapse, will follow-up  Amoxicillin 500 mg nightly started on 5/7 per IM  Cath for volumes > 350 cc. 12. Acute on Chronic renal failure: SCr- 1.32 on 04/2018. Continue to monitor with routine checks.   Creatinine 1.51 on 5/7, labs ordered for tomorrow 13. Delirium: Resolving.  14. Anemia of chronic disease: Baseline Hg around 9.0? Likely dropafterhydration.   Monitor for signs of bleeding.   Hemoglobin 8.1 on 5/6  Continue to monitor 15. Acute pancreatitis: Offer supplements with meals.   LFTs elevated on 5/6  Amylase/lipase within normal limits  Ammonia ordered  Continue to monitor 16. Hypothyroidism: Now synthroid 75 mcg/day. 17.  Orthostasis  Blood pressure elevated with orthostasis on 5/6 18.  Hypokalemia  Potassium 3.2 on 5/7  Supplement increased on 5/7 19. Hypotheramia  Require bear hugger, temps dropping without it  LOS: 3 days A FACE TO FACE EVALUATION WAS PERFORMED  Naryah Clenney [name] 09/22/2018, 8:44 AM

## 2018-09-22 NOTE — Progress Notes (Signed)
Social Work Patient ID: Maureen Stout, female   DOB: October 23, 1931, 85 y.o.   MRN: [id number] [place] with son via telephone to inform of team conference and goals of 24 hr care at discharge. They plan to provide this but have concerns regarding her medical issues. Pam-PA to contact son regarding medical issues.

## 2018-09-22 NOTE — Patient Care Conference (Signed)
Inpatient RehabilitationTeam Conference and Plan of Care Update Date: 09/21/2018   Time: 2:25 PM    Patient Name: Maureen Stout      Medical Record Number: [id number]  Date of Birth: 09/23/1931 Sex: Female         Room/Bed: 4M01C/4M01C-01 Payor Info: Payor: [employer] MEDICARE / Plan: [employer] MEDICARE CHOICE PPO / Product Type: *No Product type* /    Admitting Diagnosis: debility  sepsis  Admit Date/Time:  09/19/2018  3:08 PM Admission Comments: No comment available   Primary Diagnosis:  <principal problem not specified> Principal Problem: <principal problem not specified>  Patient Active Problem List   Diagnosis Date Noted  . Encephalopathy   . Acute pancreatitis   . Anasarca   . Anemia of chronic disease   . AKI (acute kidney injury) ([place])   . Stage 3 chronic kidney disease ([place])   . Urinary retention   . Debility 09/19/2018  . Sepsis due to Enterobacter species ([place]) 09/08/2018  . Bacteremia 09/08/2018  . Hypoglycemia without diagnosis of diabetes mellitus 09/08/2018  . Acute on chronic anemia 09/07/2018  . Acute pancreatitis without infection or necrosis 09/07/2018  . Bradycardia 09/07/2018  . Sinus bradycardia 09/06/2018  . Hypothyroidism 09/06/2018  . First degree AV block 09/06/2018  . Abnormal CXR 04/13/2018  . On amiodarone therapy 01/08/2018  . Chronic anticoagulation 01/08/2018  . Hypertensive heart disease 01/08/2018  . Elevated TSH 01/08/2018  . Pressure injury of skin 04/19/2017  . Paroxysmal atrial fibrillation ([place]) 04/18/2017    Expected Discharge Date: Expected Discharge Date: 10/03/18  Team Members Present: Physician leading conference: Dr. Delice Lesch Social Worker Present: Ovidio Kin, LCSW Nurse Present: Other (comment)(Akivia Fransico [place]) PT Present: Other (comment)(Cindy Wynn-PT) OT Present: Willeen Cass, OT;Roanna Epley, COTA SLP Present: Weston Anna, SLP PPS Coordinator present : Gunnar Fusi     Current Status/Progress Goal Weekly Team Focus   Medical   Functional deficits and weakness secondary to debility and encephalopathy after urosepsis/pancreatitis and multiple medical issues  Improve mobility, transfers, safety, cognition, multiple medical issues  See above   Bowel/Bladder   continent of bowel, I/O q6hrs; LBM: 05/04  regain continences of bladder; maintian regular bowel pattern  assist with tolieting needs prn    Swallow/Nutrition/ Hydration             ADL's   mod assist bed mobility, refused any bathing/dressing due to nausea/fatigue  supervision  ADL retraining, functional transfers, sitting balance, activity tolerance   Mobility   mod A transfers, gait only 2' on eval due to fatigue/nausea  supervision overall  activity tolerance, d/c planning   Communication   Max A  Min A  word-finding to express wants/needs and auditory comprehension of basic information    Safety/Cognition/ Behavioral Observations  Max A  Min A  attention, problem solving and memory    Pain   no c/o pain  remain pain free  assess pain level qshift and prn    Skin   abrasion to R arm, blister to L buttocks   treat current skin issues per orders; remain free of new skin breakdown/infection  assess skin qshift and prn       *See Care Plan and progress notes for long and short-term goals.     Barriers to Discharge  Current Status/Progress Possible Resolutions Date Resolved   Physician    Medical stability;IV antibiotics;Nutrition means     See above  Therapies, multiple medical issues, will consider GI consult, CT head ordered  Nursing                  PT  Decreased caregiver support;Medical stability                 OT                  SLP                SW                Discharge Planning/Teaching Needs:  HOme to her home with family Johny Drilling, daughter in-law, brother and sister in-law assisting her. Aware will need 24 hr physical care at DC from rehab.      Team Discussion:  Goals supervision level. Medical issues  IV fluids for hydration-poor po intake. Dietician involved. CKD-anemia. CT of head to rule out out CVA. BP issues. Stage 2 on sacrum-nursing address. I & O cath being done. Current level min-mod level. Difficulty participating due to medial issues and deconditioning-made 15/7  Revisions to Treatment Plan:  DC 5/18    Continued Need for Acute Rehabilitation Level of Care: The patient requires daily medical management by a physician with specialized training in physical medicine and rehabilitation for the following conditions: Daily direction of a multidisciplinary physical rehabilitation program to ensure safe treatment while eliciting the highest outcome that is of practical value to the patient.: Yes Daily medical management of patient stability for increased activity during participation in an intensive rehabilitation regime.: Yes Daily analysis of laboratory values and/or radiology reports with any subsequent need for medication adjustment of medical intervention for : Neurological problems;Renal problems;Other;Mood/behavior problems   I attest that I was present, lead the team conference, and concur with the assessment and plan of the team. Teleconference held due to [place], [place] 09/22/2018, 8:43 AM

## 2018-09-22 NOTE — Progress Notes (Addendum)
Nutrition Follow-up  RD working remotely.  DOCUMENTATION CODES:   Not applicable  INTERVENTION:   ADDENDUM: 1434: RD paged by fluoroscopy. Pt had post-pyloric Cortrak tube placed under fluoro. RD will change TF regimen to continuous as below and change start time to today. Cortrak team available to bridle tube tomorrow.  ADDENDUM 1344: Noted plan for enteral access has changed. Pt now with Cortrak team consult placed. Cortrak team available tomorrow, 09/23/18. Will change start time of TF orders to tomorrow.  Continuous TF via post-pyloric Cortrak: - Osmolite 1.5 @ 50 ml/hr for 20 hours (tube feeding can be held up to 4 hours for therapies, total of 1000 ml daily) - Pro-stat 30 ml BID - Free water per MD  Tube feeding regimen provides 1700 kcal, 93 grams of protein, and 762 ml of H2O (100% of needs).  - Continue Ensure Enlive po TID, each supplement provides 350 kcal and 20 grams of protein  - Continue Magic cup TID with meals, each supplement provides 290 kcal and 9 grams of protein  NUTRITION DIAGNOSIS:   Inadequate oral intake related to lethargy/confusion, poor appetite as evidenced by meal completion < 50%.  Ongoing, being addressed via TF  GOAL:   Patient will meet greater than or equal to 90% of their needs  Met via bolus TF regimen  MONITOR:   PO intake, Supplement acceptance, TF tolerance, Labs, Skin, Weight trends, I & O's  REASON FOR ASSESSMENT:   Consult Enteral/tube feeding initiation and management  ASSESSMENT:   85 year old female with PMH of T2DM, PAF, CKD, enterovesicular fistula, sinus bradycardiawho was admitted on 09/06/18 with 1 week history of dizziness and weakness. Pt was found to have marked bradycardia,acute on chronic renal failure as well as elevated TSH. Pt developed hypotension and abdominal pain due to urosepsis and was started on IV abx for treatment. CT abdomen revealed acute pancreatitis and treated with NPO and D5NS. Pt has had issues  with confusion and agitation due to delirium. Mentation improved briefly but she developed lethargy with hypothermia felt to be due to sepsis. Follow-up CT abdomen 4/27 showed no change in mild acute pancreatitis with increase in diffuse body wall edema with small to moderate bilateral pleural effusions. Pt continues to have intermittent hypothermia and mentation slowly improving. Pt admitted to CIR on 5/04.  Per MD note this AM, "will attempt to place NG and follow-up for tolerance of feeds."  ADDENDUM: Spoke with RN who reports that nursing does not place NG tubes. Noted pt now with Cortrak consult. Cortrak team available tomorrow, 09/23/18. Later, noted that Cortrak tube was placed today under fluoro. Tube is post-pyloric. Will initiate a continuous TF regimen.  Weight down a total of 13 lbs since admission.  Will continue with order for oral nutrition supplements at this time.  Meal Completion: 0-25% x 5 meals  Medications reviewed and include: Oscal with D, Ensure Enlive TID, Miralax, KCl 30 mEq daily IVF: NS @ 75 ml/hr  Labs reviewed: potassium 3.2 (L), elevated LFTs, hemoglobin 8.1 (L)  UOP: 800 ml x 24 hours I/O's: -2.9 L since admit  Diet Order:   Diet Order            DIET SOFT Room service appropriate? No; Fluid consistency: Thin  Diet effective now              EDUCATION NEEDS:   Not appropriate for education at this time  Skin:  Skin Assessment: Skin Integrity Issues: Stage II: buttocks Incisions: right foot, left  food (both present on admission) Other: MASD to buttocks, perineum, and vagina; non-pressure wound to right arm  Last BM:  09/19/18  Height:   Ht Readings from Last 1 Encounters:  09/19/18 '5\' 6"'  (1.676 m)    Weight:   Wt Readings from Last 1 Encounters:  09/22/18 81.5 kg    Ideal Body Weight:  59.1 kg  BMI:  Body mass index is 29 kg/m.  Estimated Nutritional Needs:   Kcal:  1550-1750  Protein:  80-95 grams  Fluid:  >/= 1.6  L    Gaynell Face, MS, RD, LDN Inpatient Clinical Dietitian Pager: [phone] Weekend/After Hours: [phone]

## 2018-09-22 NOTE — Progress Notes (Signed)
Notified provider on call about patients refusal to take PO mediations. Order received from [facility], [place] to iniitated new orders for IV fluids; orders initiated as ordered. Will continue to monitor

## 2018-09-22 NOTE — Consult Note (Addendum)
Date: 09/22/2018               Patient Name:  Maureen Stout MRN: [id number]  DOB: [birth year] Age / Sex: 85 y.o., female   PCP: Nicoletta Dress, MD         Requesting Physician: Dr. Posey Pronto, Domenick Bookbinder, MD    Consulting Reason:  Hospital delirium, failure to thrive, multiple complex medical conditions      History of Present Illness: Maureen Stout well known to our service was recently transferred to [facility] for rehab after treatment for sepsis 2/2 UTI and encephalopathy.  She was not able to answer my questions coherently this morning, does follow some commands.    Allergies: Allergies as of 09/19/2018 - Review Complete 09/19/2018  Allergen Reaction Noted  . Vancomycin Rash 08/04/2017   Past Medical History:  Diagnosis Date  . Acute on chronic anemia 09/07/2018  . Bladder problem   . CKD (chronic kidney disease), stage III ([place])   . Diabetes mellitus ([place])   . Dyslipidemia   . Hypertension   . Paroxysmal A-fib ([place])   . Paroxysmal atrial flutter ([place])   . Sinus bradycardia    Past Surgical History:  Procedure Laterality Date  . PARTIAL HYSTERECTOMY    . TOE SURGERY    . TONSILLECTOMY      Review of Systems: Pt unable to provide history at this time due to delirium   Physical Exam: Blood pressure (!) 150/70, pulse 73, temperature (!) 97.2 F (36.2 C), temperature source Rectal, resp. rate 18, height 5\' 6"  (1.676 m), weight 81.5 kg, SpO2 92 %.  Cardiac: normal rate and rhythm, clear s1 and s2, no murmurs, rubs or gallops Pulmonary: Decreased breath sounds in bases, normal work of breathing, not in distress Abdominal: non distended abdomen, soft and nontender Extremities: 3+ LE edema with minimal pitting bilaterally Psych: disoriented, answering questions as yes despite question, picks up the phone and speaks intelligibly but no one is on the line Neuro: grip strength normal and equal bilaterally, otherwise would not follow commands enough to complete neurological examination    Lab results: BMP Latest Ref Rng & Units 09/22/2018 09/21/2018 09/20/2018  Glucose 70 - 99 mg/dL 78 112(H) 115(H)  BUN 8 - 23 mg/dL 17 20 25(H)  Creatinine 0.44 - 1.00 mg/dL 1.51(H) 1.44(H) 1.49(H)  BUN/Creat Ratio 12 - 28 - - -  Sodium 135 - 145 mmol/L 141 141 138  Potassium 3.5 - 5.1 mmol/L 3.2(L) 3.1(L) 3.6  Chloride 98 - 111 mmol/L 104 102 105  CO2 22 - 32 mmol/L 25 26 25   Calcium 8.9 - 10.3 mg/dL 8.6(L) 8.8(L) 8.4(L)   CBC Latest Ref Rng & Units 09/21/2018 09/20/2018 09/19/2018  WBC 4.0 - 10.5 K/uL 4.9 4.7 5.3  Hemoglobin 12.0 - 15.0 g/dL 8.1(L) 7.5(L) 7.6(L)  Hematocrit 36.0 - 46.0 % 24.8(L) 23.7(L) 24.3(L)  Platelets 150 - 400 K/uL 203 190 188   Procalcitonin ng/mL <0.10  <0.10 CM     Imaging results:  Ct Head Wo Contrast  Result Date: 09/21/2018 CLINICAL DATA:  Altered level of consciousness. EXAM: CT HEAD WITHOUT CONTRAST TECHNIQUE: Contiguous axial images were obtained from the base of the skull through the vertex without intravenous contrast. COMPARISON:  None. FINDINGS: Brain: No evidence of acute infarction, hemorrhage, hydrocephalus, extra-axial collection or mass lesion/mass effect. Vascular: No hyperdense vessel or unexpected calcification. Skull: Intact.  No focal lesion. Sinuses/Orbits: Status post cataract surgery.  Otherwise negative. Other: None. IMPRESSION: Negative head CT. Electronically Signed  By: Inge Rise M.D.   On: 09/21/2018 13:15   Assessment, Plan, & Recommendations by Problem: Active Problems:   Debility   Acute pancreatitis   Anasarca   Anemia of chronic disease   AKI (acute kidney injury) ([place])   Stage 3 chronic kidney disease ([place])   Urinary retention   Encephalopathy   Failure to thrive (child)   Recurrent UTI   CKD (chronic kidney disease), stage III ([facility])   Orthostasis   Hypokalemia  Hypothermia/Encephalopathy: pt likely has recurrence of her hospital delirium.  No metabolic abnormalities to explain current mental status although she is  clearly malnourished which could be contributing.  Do not currently feel this is from infection but will have a low threshold to retest given vesicocolonic fistula.   Head CT 5/6 negative for acute abnormalities.    -continue delirium precautions, please open blinds daily in the morning, encourage being awake during the day and sleeping at night -pt warm today if pt becomes hypothermic again would verify with rectal temp -if rectal temp consistent with hypothermia would repeat urinalysis and urine culture and blood cultures -continue amoxicillin  Pancreatitis: resolved  PAF: currently in sinus rhythm  -continue eliquis, amiodarone  Malnutrition: chronic issue for the patient, appears cortrak has been discussed and starting today.  Nutrition consulted  -continue enteral feeds  CKD: renal function stable, may be retaining some volume   -continue to monitor would favor less NS  Anasarca: in the setting of malnutrition and CKD, pt with severely low albumin  -nutrition  HFpEF: volume status stable  -already switched to free water through tube for hydration which will decrease salt load.  Urinary Retention: continues to be an issue, possibly related to her current mental status as well  -continue bladder scans and in and out cath, high risk for infection with indwelling catheterization  Overall poor prognosis, low threshold to involve palliative care  Signed: Katherine Roan, MD 09/22/2018, 11:48 AM

## 2018-09-23 ENCOUNTER — Inpatient Hospital Stay (HOSPITAL_COMMUNITY): Payer: Medicare PPO | Admitting: Speech Pathology

## 2018-09-23 ENCOUNTER — Inpatient Hospital Stay (HOSPITAL_COMMUNITY): Payer: Medicare PPO | Admitting: Occupational Therapy

## 2018-09-23 ENCOUNTER — Inpatient Hospital Stay (HOSPITAL_COMMUNITY): Payer: Medicare PPO | Admitting: Physical Therapy

## 2018-09-23 DIAGNOSIS — Z7901 Long term (current) use of anticoagulants: Secondary | ICD-10-CM

## 2018-09-23 DIAGNOSIS — R627 Adult failure to thrive: Secondary | ICD-10-CM

## 2018-09-23 DIAGNOSIS — R680 Hypothermia, not associated with low environmental temperature: Secondary | ICD-10-CM

## 2018-09-23 DIAGNOSIS — I169 Hypertensive crisis, unspecified: Secondary | ICD-10-CM

## 2018-09-23 DIAGNOSIS — E43 Unspecified severe protein-calorie malnutrition: Secondary | ICD-10-CM

## 2018-09-23 DIAGNOSIS — I5030 Unspecified diastolic (congestive) heart failure: Secondary | ICD-10-CM

## 2018-09-23 LAB — BASIC METABOLIC PANEL
Anion gap: 16 — ABNORMAL HIGH (ref 5–15)
BUN: 15 mg/dL (ref 8–23)
CO2: 24 mmol/L (ref 22–32)
Calcium: 8.9 mg/dL (ref 8.9–10.3)
Chloride: 103 mmol/L (ref 98–111)
Creatinine, Ser: 1.4 mg/dL — ABNORMAL HIGH (ref 0.44–1.00)
GFR calc Af Amer: 40 mL/min — ABNORMAL LOW (ref >60)
GFR calc non Af Amer: 34 mL/min — ABNORMAL LOW (ref >60)
Glucose, Bld: 62 mg/dL — ABNORMAL LOW (ref 70–99)
Potassium: 3.3 mmol/L — ABNORMAL LOW (ref 3.5–5.1)
Sodium: 143 mmol/L (ref 135–145)

## 2018-09-23 LAB — PROCALCITONIN: Procalcitonin: <0.1 ng/mL

## 2018-09-23 MED ORDER — POTASSIUM CHLORIDE 10 MEQ/100ML IV SOLN
10.0000 meq | INTRAVENOUS | Status: AC
  Administered 2018-09-23 (×4): 10 meq via INTRAVENOUS
  Filled 2018-09-23 (×4): qty 100

## 2018-09-23 MED ORDER — SODIUM CHLORIDE 0.45 % IV SOLN
INTRAVENOUS | Status: DC
  Administered 2018-09-23 – 2018-09-26 (×4): via INTRAVENOUS

## 2018-09-23 MED ORDER — OSMOLITE 1.5 CAL PO LIQD
237.0000 mL | Freq: Four times a day (QID) | ORAL | Status: DC
  Administered 2018-09-23 – 2018-09-28 (×16): 237 mL
  Filled 2018-09-23 (×31): qty 237

## 2018-09-23 NOTE — Progress Notes (Signed)
Pt. Pulled feeding tube for the third time,Dr Naaman Plummer was notified,orders received for 0.45 NS @ 75 ml/hr. Feeding tube placement.Keep monitoring pt. Closely.

## 2018-09-23 NOTE — Progress Notes (Signed)
Speech Language Pathology Daily Session Note  Patient Details  Name: Maureen Stout MRN: [id number] Date of Birth: [birth year]  Today's Date: 09/23/2018 SLP Individual Time: 0700-0730 SLP Individual Time Calculation (min): 30 min  Short Term Goals: Week 1: SLP Short Term Goal 1 (Week 1): Patient will demonstrate sustained attention to functional tasks for 30 minutes with Min A verbal cues for redirection SLP Short Term Goal 2 (Week 1): Patient will demonstrate functional problem solving for basic and familair tasks with Mod A verbal cues.  SLP Short Term Goal 3 (Week 1): Patient will utilize external aids to recall new, daily information with Max A multimodal cues.  SLP Short Term Goal 4 (Week 1): Patient wil follow multi-step commands with Mod A verbal and visual cues.  SLP Short Term Goal 5 (Week 1): Patient will verbalize basic information/wants and needs at the phrase level with Mod A verbal cues for word-finding.   Skilled Therapeutic Interventions: Skilled treatment session focused on cognitive goals. Upon arrival, patient was awake and appeared more alert compared to yesterday. When asked if she was hungry and wanted something to eat, patient replied "yeah." SLP facilitated session by providing a variety of foods for patient to try to maximize PO intake. With every trial, patient appeared to grimace in disgust and would either orally expectorate the food or keep her oral cavity open until it was suctioned. Patient declined liquids. RN and PA aware. Patient followed basic commands consistently and was oriented to self only. Patient would answer basic questions at the word level but was essentially unintelligible when attempting to verbalize at the phrase level. As session continued, patient appeared more lethargic and resting her head on her pillow. Therefore, session ended early and patient missed remaining 15 minutes of session. Patient left with alarm on and all needs within reach. Continue with  current plan of care.      Pain No/Denies Pain   Therapy/Group: Individual Therapy  Jacoby Zanni, [place] 09/23/2018, 10:18 AM

## 2018-09-23 NOTE — Progress Notes (Signed)
[place] PHYSICAL MEDICINE & REHABILITATION PROGRESS NOTE  Subjective/Complaints: Patient seen sitting up in bed this morning.  She slept well overnight, no reported issues.  Discussed with nursing-she did not require bear hugger overnight.  Discussed with SLP-patient pulled out NG overnight.  She is spitting up anything given orally this morning.  She is more alert this morning. Discussed at length with PA, who had discussion with family yesterday.   ROS: Unable to assess due to cognition.  Objective: Vital Signs: Blood pressure (!) 158/72, pulse 78, temperature (!) 97.4 F (36.3 C), resp. rate 18, height 5\' 6"  (1.676 m), weight 82.3 kg, SpO2 94 %. Dg Abd 1 View  Result Date: 09/22/2018 CLINICAL DATA:  Feeding tube placement. EXAM: ABDOMEN - 1 VIEW FLUOROSCOPY TIME:  Fluoroscopy Time: 1 minutes 24 seconds Radiation Exposure Index: 782.39 microGray*m^2 COMPARISON:  CT abdomen and pelvis 09/12/2018 FINDINGS: A single fluoroscopic image is provided. A feeding tube terminates in the central abdomen just left of midline near the ligament of Treitz, and injected contrast opacifies small bowel. IMPRESSION: Feeding tube placement as above. Electronically Signed   By: Logan Bores M.D.   On: 09/22/2018 15:09   Ct Head Wo Contrast  Result Date: 09/21/2018 CLINICAL DATA:  Altered level of consciousness. EXAM: CT HEAD WITHOUT CONTRAST TECHNIQUE: Contiguous axial images were obtained from the base of the skull through the vertex without intravenous contrast. COMPARISON:  None. FINDINGS: Brain: No evidence of acute infarction, hemorrhage, hydrocephalus, extra-axial collection or mass lesion/mass effect. Vascular: No hyperdense vessel or unexpected calcification. Skull: Intact.  No focal lesion. Sinuses/Orbits: Status post cataract surgery.  Otherwise negative. Other: None. IMPRESSION: Negative head CT. Electronically Signed   By: Inge Rise M.D.   On: 09/21/2018 13:15   Recent Labs    09/21/18 0817  09/22/18 1202  WBC 4.9 5.1  HGB 8.1* 7.9*  HCT 24.8* 24.7*  PLT 203 196   Recent Labs    09/22/18 0516 09/23/18 0434  NA 141 143  K 3.2* 3.3*  CL 104 103  CO2 25 24  GLUCOSE 78 62*  BUN 17 15  CREATININE 1.51* 1.40*  CALCIUM 8.6* 8.9    Physical Exam: BP (!) 158/72 (BP Location: Left Arm)   Pulse 78   Temp (!) 97.4 F (36.3 C)   Resp 18   Ht 5\' 6"  (1.676 m)   Wt 82.3 kg   SpO2 94%   BMI 29.28 kg/m  Constitutional: No distress . Vital signs reviewed. HENT: Normocephalic.  Atraumatic. Eyes: EOMI.  No discharge. Cardiovascular: No JVD. Respiratory: Normal effort. GI: Non-distended. Musculoskeletal: Generalized edema, improving Neurological:  Alert, confused Intermittently cooperative.  Motor: Grossly 4-/5 throughout, appears to be stable Skin: Frail skin. Psychiatric: Flat  Assessment/Plan: 1. Functional deficits secondary to debility and encephalopathy which require 3+ hours per day of interdisciplinary therapy in a comprehensive inpatient rehab setting.  Physiatrist is providing close team supervision and 24 hour management of active medical problems listed below.  Physiatrist and rehab team continue to assess barriers to discharge/monitor patient progress toward functional and medical goals  Care Tool:  Bathing  Bathing activity did not occur: Refused Body parts bathed by patient: Face         Bathing assist Assist Level: Supervision/Verbal cueing     Upper Body Dressing/Undressing Upper body dressing Upper body dressing/undressing activity did not occur (including orthotics): Refused What is the patient wearing?: Hospital gown only    Upper body assist Assist Level: Maximal Assistance -  Patient 25 - 49%    Lower Body Dressing/Undressing Lower body dressing    Lower body dressing activity did not occur: Refused What is the patient wearing?: Hospital gown only     Lower body assist Assist for lower body dressing: Maximal Assistance -  Patient 25 - 49%     Toileting Toileting    Toileting assist Assist for toileting: Total Assistance - Patient < 25%     Transfers Chair/bed transfer  Transfers assist     Chair/bed transfer assist level: Maximal Assistance - Patient 25 - 49%     Locomotion Ambulation   Ambulation assist      Assist level: Moderate Assistance - Patient 50 - 74% Assistive device: Walker-rolling Max distance: 5(8' in parallel bars)   Walk 10 feet activity   Assist  Walk 10 feet activity did not occur: Safety/medical concerns        Walk 50 feet activity   Assist Walk 50 feet with 2 turns activity did not occur: Safety/medical concerns         Walk 150 feet activity   Assist Walk 150 feet activity did not occur: Safety/medical concerns         Walk 10 feet on uneven surface  activity   Assist Walk 10 feet on uneven surfaces activity did not occur: Safety/medical concerns         Wheelchair     Assist Will patient use wheelchair at discharge?: No             Wheelchair 50 feet with 2 turns activity    Assist            Wheelchair 150 feet activity     Assist            Medical Problem List and Plan: 1.Functional deficits and weaknesssecondary to debility and encephalopathy after urosepsis/pancreatitis and multiple medical issues  Cont CIR, made 15/7  Encephalopathy appears to be labile per chart review, however no improvement since admission   CT head reviewed, unremarkable for acute intracranial process  Internal medicine notes reviewed- continue current management  Will consider palliative care consult next week pending response to tube feeds 2. Antithrombotics: -PAF/DVT/anticoagulation:Pharmaceutical:Other (comment)- Eliquis -antiplatelet therapy:  3. Pain Management:Tylenol prn. 4. Mood:LCSW to follow for evaluation and support. -antipsychotic agents: N/A 5. Neuropsych: This  patientisnot fully capable of making decisions on herown behalf. 6. Skin/Wound Care:Routine pressure relief measures. 7. Fluids/Electrolytes/Nutrition:Strict I/O.   Extremely poor PO intake, related to cognition +/- nausea - will not schedule antiemetics secondary to AMS.     NGT placed yesterday, however patient pulled overnight, replaced this morning.    IVF [place] on 5/8 8. Enterococcus bacteremia/UTI: Completed 14-day course of Maxipime   Amoxicillin 500 mg nightly started on 5/7 9. Anasarca/FLuid overload: Added heart healthy restrictions. Weight daily. Lasix daily prn depending stability of weights/symptoms.  Filed Weights   09/20/18 0356 09/22/18 0500 09/23/18 0404  Weight: 83.7 kg 81.5 kg 82.3 kg   Stable on 5/8 10. PAF: Monitor HR bid--back on amiodarone and Eliquis. Off metoprolol due to bradycardia. 11. Urinary retention with recurrent UTIs:   PVRs showing retention, however inconsistent readings and asynchronous with I/O caths  Bladder prolapse, will follow-up  Amoxicillin 500 mg nightly started on 5/7 per IM  Cath for volumes > 350 cc. 12. Acute on Chronic renal failure: SCr- 1.32 on 04/2018. Continue to monitor with routine checks.   Creatinine 1.40 on 5/8, labs ordered for tomorrow   Labs  ordered for Monday 13. Delirium: Resolving.  14. Anemia of chronic disease: Baseline Hg around 9.0? Likely dropafterhydration.   Monitor for signs of bleeding.   Hemoglobin 8.1 on 5/6  Continue to monitor 15. Acute pancreatitis: Offer supplements with meals.   LFTs elevated on 5/6  Amylase/lipase within normal limits  Ammonia WNL  Continue to monitor 16. Hypothyroidism: Now synthroid 75 mcg/day. 17.  Orthostasis  Labile with hypertensive crisis overnight, IVF DC'd 18.  Hypokalemia  Potassium 3.3 on 5/8, labs ordered for tomorrow   Labs ordered for Monday  Supplement increased on 5/7  IV supplement ordered on 5/8 19. Hypotheramia  Did not require bear hugger last  night, cont to monitor  LOS: 4 days A FACE TO FACE EVALUATION WAS PERFORMED  Acire Tang [name] 09/23/2018, 8:59 AM

## 2018-09-23 NOTE — Progress Notes (Signed)
Occupational Therapy Session Note  Patient Details  Name: Maureen Stout MRN: [id number] Date of Birth: 07/25/1931  Today's Date: 09/23/2018 OT Individual Time: 1050-1120 OT Individual Time Calculation (min): 30 min  and Today's Date: 09/23/2018 OT Missed Time: 30 Minutes Missed Time Reason: Nursing care   Short Term Goals: Week 1:  OT Short Term Goal 1 (Week 1): Pt will complete bathing at sit > stand level with min assist OT Short Term Goal 2 (Week 1): Pt will complete toilet transfers with min assist  OT Short Term Goal 3 (Week 1): Pt will complete LB dressing with min assist OT Short Term Goal 4 (Week 1): Pt will maintain sitting balance at EOB 5 mins with supervision to engage in self-care tasks  Skilled Therapeutic Interventions/Progress Updates:    Attempted to see pt for scheduled therapy session, Nurse tech present performing I/O cath.  Therapist returned when completed.  Pt more alert this session, oriented to self and place.  Pt willing to engage in therapeutic activity at EOB.  Completed bed mobility with min-mod assist and completed sit > stand from EOB with min-mod assist and manual facilitation for anterior weight shift.  Pt continues to demonstrate posterior lean in standing, requiring tactile cues and manual facilitation for improved postural control/upright standing balance.  Attempted to engage in marching in place with pt unable to fully unweight in stepping pattern, but able to unweight to slide foot.  Pt reports fatigue and requesting to return to supine.  Mod assist bed mobility to return to semi-reclined.  RD in to reinsert Cortrak.  Pt left with all needs in reach.  Therapy Documentation Precautions:  Precautions Precautions: Fall Restrictions Weight Bearing Restrictions: No Pain:  Pt with no c/o pain   Therapy/Group: Individual Therapy  Simonne Come 09/23/2018, 11:46 AM

## 2018-09-23 NOTE — Progress Notes (Signed)
Upon arrival at pt's bedside, unit RN stated pt order just written for pt to receive IV potassium and she was trying to cancel consult.

## 2018-09-23 NOTE — Progress Notes (Signed)
Attempted to give medicine crushed with apple sauce. Patient keep on spitting water and the apple sauce with medicine inspite instruction to swallow and chin tuck. Dr. Naaman Plummer informed with order to try again later. RN asked MD if we need to use SCD and replied no.

## 2018-09-23 NOTE — Progress Notes (Signed)
Nutrition Follow-up  RD working remotely.  DOCUMENTATION CODES:   Severe malnutrition in context of acute illness/injury  INTERVENTION:   Monitor magnesium, potassium, and phosphorus daily for at least 3 days, MD to replete as needed, as pt is at risk for refeeding syndrome given severe acute malnutrition.  Initiate bolus TF regimen via gastric Cortrak: - Osmolite 1.5 cal formula 1 can QID (total of 948 ml daily) - Pro-stat 30 ml BID - Free water per MD, currently 100 ml q 8 hours  Tube feeding regimen provides 1622 kcal, 89 grams of protein, and 1022 ml of H2O (100% of needs).  - will d/c Ensure Enlive at this time  NUTRITION DIAGNOSIS:   Severe Malnutrition related to acute illness (sepsis) as evidenced by energy intake < or equal to 50% for > or equal to 5 days, percent weight loss (5.7% weight loss in less than 1 week).  New diagnosis, being addressed via TF  GOAL:   Patient will meet greater than or equal to 90% of their needs  Met via bolus TF regimen  MONITOR:   PO intake, Labs, Weight trends, I & O's, Skin, TF tolerance  REASON FOR ASSESSMENT:   Consult Enteral/tube feeding initiation and management  ASSESSMENT:   85 year old female with PMH of T2DM, PAF, CKD, enterovesicular fistula, sinus bradycardiawho was admitted on 09/06/18 with 1 week history of dizziness and weakness. Pt was found to have marked bradycardia,acute on chronic renal failure as well as elevated TSH. Pt developed hypotension and abdominal pain due to urosepsis and was started on IV abx for treatment. CT abdomen revealed acute pancreatitis and treated with NPO and D5NS. Pt has had issues with confusion and agitation due to delirium. Mentation improved briefly but she developed lethargy with hypothermia felt to be due to sepsis. Follow-up CT abdomen 4/27 showed no change in mild acute pancreatitis with increase in diffuse body wall edema with small to moderate bilateral pleural effusions. Pt  continues to have intermittent hypothermia and mentation slowly improving. Pt admitted to CIR on 5/04.  5/07 - post-pyloric Cortrak tube placed under fluoro, continuous TF started, pt later pulled tube 5/08 - gastric Cortrak tube placed by Cortrak team and bridled, bolus TF to start  Noted pt is spitting up anything given orally this morning but is more alert. MD considering palliative care consult next week pending response to tube feeds.  Pt with 11 lb weight loss over the last 4 days. This is a 5.7% weight loss which is significant for timeframe. Pt meets criteria for severe acute malnutrition given significant weight loss and inadequate PO intake.  Reviewed RN edema assessment. Pt with mild pitting generalized edema.  Pt with gastric Cortrak placement ans is appropriate for bolus TF regimen.  Meal Completion: 0-25%  Medications reviewed and include: Ensure Enlive TID, Oscal with D, Miralax, KCl 30 mEq daily, KCl 10 mEq x 4 runs  Labs reviewed: potassium 3.3 (L), creatinine 1.40 (H), hemoglobin 7.9 (L)  UOP: 900 ml x 24 hours I/O's: -2.1 L since admit  Diet Order:   Diet Order            DIET SOFT Room service appropriate? No; Fluid consistency: Thin  Diet effective now              EDUCATION NEEDS:   Not appropriate for education at this time  Skin:  Skin Assessment: Skin Integrity Issues: Stage II: buttocks Incisions: right foot, left food (both present on admission) Other: MASD to buttocks,  perineum, and vagina; non-pressure wound to right arm  Last BM:  09/19/18  Height:   Ht Readings from Last 1 Encounters:  09/19/18 _0  (1.676 m)    Weight:   Wt Readings from Last 1 Encounters:  09/23/18 82.3 kg    Ideal Body Weight:  59.1 kg  BMI:  Body mass index is 29.28 kg/m.  Estimated Nutritional Needs:   Kcal:  1550-1750  Protein:  80-95 grams  Fluid:  >/= 1.6 L    Gaynell Face, MS, RD, LDN Inpatient Clinical Dietitian Pager:  [phone] Weekend/After Hours: [phone]

## 2018-09-23 NOTE — Procedures (Signed)
Cortrak  Person Inserting Tube:  Terriah Reggio, Bethann Berkshire, RD Tube Type:  Cortrak - 43 inches Tube Location:  Right nare Initial Placement:  Stomach Secured by: Bridle Technique Used to Measure Tube Placement:  Documented cm marking at nare/ corner of mouth Cortrak Secured At:  63 cm    Pt had tube placed at 10am and pulled it out around 11am. Consult received to re-place a Cortrak feeding tube.   No x-ray is required. RN may begin using tube.   If the tube becomes dislodged please keep the tube and contact the Cortrak team at [email or web address] ([email or web address]) for replacement.  If after hours and replacement cannot be delayed, place a NG tube and confirm placement with an abdominal x-ray.    Mariana Single RD, LDN Clinical Nutrition Pager # [phone]

## 2018-09-23 NOTE — Progress Notes (Addendum)
Subjective: Patient was seen and evaluated at bedside on morning rounds.  NG tube placed yesterday, but she pulled it out overnight.  A new NG tube was placed this morning, but she pulled that out as well.  When we arrived, she was still pulling consistently at the bridle looped through her nose.  She answers with few words.  She denies any pain.  Objective:  Vital signs in last 24 hours: Vitals:   09/22/18 2020 09/23/18 0356 09/23/18 0404 09/23/18 0600  BP: 132/68 (!) 158/72    Pulse:  77  78  Resp:  18    Temp:  (!) 97.4 F (36.3 C)    TempSrc:      SpO2:  94%    Weight:   82.3 kg   Height:       General: Lying in the bed no acute distress, appears confused.  CV: RRR, normal S1-S2, no murmur Pulmonary exam: Normal work of breathing, no crackles no wheezing Abdomen: Soft and nontender to palpation, BS are present Extremities: Has 2+ nonpitting edema in bilateral lower extremities: Neurology exam: Patient is alert but disoriented.  She moves all 4 extremities  Assessment/Plan:  Active Problems:   Debility   Acute pancreatitis   Anasarca   Anemia of chronic disease   AKI (acute kidney injury) ([place])   Stage 3 chronic kidney disease (HCC)   Urinary retention   Encephalopathy   Failure to thrive (child)   Recurrent UTI   CKD (chronic kidney disease), stage III (HCC)   Orthostasis   Hypokalemia  85 year old woman initially admitted to our service with sepsis due to a Enterobacter UTI with resulting bacteremia and accompanied by acute encephalopathy.    Recommendations by problem are as follows:  Hypothermia/Encephalopathy:  Given her nonfocal neurologic exam and negative head imaging, strongly suspect delirium related to acute illness, malnutrition, and unfamiliar setting.  She had significant hypothermia on initial presentation that gradually resolved.  While in rehab, she had a brief recurrence of this hypothermia, but temperature has been stable in the last 24 hours  off of the bear hugger.  She does not have any signs of recurrent infection, though she is at high risk of recurrent UTI.  -Continue delirium precautions (please open blinds daily in the morning, encourage being awake during the day and sleeping at night) -if patient becomes hypothermic again would verify with rectal temp -if rectal temp consistent with hypothermia would repeat urinalysis and urine culture and blood cultures -continue amoxicillin -Continue to ensure adequate bowel and bladder function with intermittent catheterization and bowel regimen as needed, but please avoid indwelling Foley given her risk for recurrent UTI  Severe Malnutrition: Unfortunately, NG tube does not appear to be an option for her currently with her delirium.  Appreciate nutrition consultation.  If she is not able to tolerate an NG tube or resume eating and drinking, will need to consider palliative care consult and consideration of hospice.  Pancreatitis: resolved  PAF: currently in sinus rhythm -continue eliquis, amiodarone  CKD: renal function stable, may be retaining some volume  -continue to monitor BMP -Gentle IV fluids with care given her CKD and HFpEF, but balancing her poor intake  Anasarca: in the setting of malnutrition and CKD, pt with severely low albumin -Continue working on nutrition and fluid balance as above  HFpEF: volume status stable  Urinary Retention: continues to be an issue, possibly related to her current mental status as well -Continue bladder scans and in and out  cath, high risk for infection with indwelling catheterization  We will continue to follow  Dewayne Hatch, MD 09/23/2018, 2:16 PM Pager: [phone]  Internal Medicine Attending Attestation:   I have seen and evaluated this patient and I have discussed the plan of care with the house staff. Please see their note for complete details.  The above note reflects my edits and additions as needed  Oda Kilts, MD 09/23/2018, 4:11 PM

## 2018-09-23 NOTE — Progress Notes (Signed)
Patient alert to self and verbally responsive, denies pain this shift. Refuse meds this shift;remains on continuous IV fluids; intake poor this shift.  Adequate urine output this shift. On call PA made aware; day shift nurse given number to call for Cortek replacement for.   Will continue to monitor

## 2018-09-23 NOTE — Progress Notes (Signed)
Physical Therapy Session Note  Patient Details  Name: SHANTAVIA JHA MRN: [id number] Date of Birth: 1931-06-10  Today's Date: 09/23/2018 PT Individual Time: 0830-0900 PT Individual Time Calculation (min): 30 min   Short Term Goals: Week 1:  PT Short Term Goal 1 (Week 1): pt will perform functional transfers with min A PT Short Term Goal 2 (Week 1): pt will perform gait x 15' in controlled environment with min A  Skilled Therapeutic Interventions/Progress Updates:    pt rec'd attempting to get out of bed.  PT assisted pt to edge of bed with min A.  Supervision sitting balance with mod/max cuing to comb hair due to decreased UE range of motion and impaired attention to task.  Pt performs stand pivot transfer with mod A to recliner.  Seated in recliner pt able to wash face and arms with max A.  Pt able to stand with mod A with total A to don pants.  Pt then with fatigue, unable to participate in further functional activity despite max cuing.  Pt left in recliner with needs at hand, alarm set.  Therapy Documentation Precautions:  Precautions Precautions: Fall Restrictions Weight Bearing Restrictions: No Pain:  no signs/symptoms of pain   Therapy/Group: Individual Therapy  Herman Fiero 09/23/2018, 9:33 AM

## 2018-09-23 NOTE — Procedures (Signed)
Cortrak  Person Inserting Tube:  Nikesha Kwasny, RD Tube Type:  Cortrak - 43 inches Tube Location:  Right nare Initial Placement:  Stomach Secured by: Bridle Technique Used to Measure Tube Placement:  Documented cm marking at nare/ corner of mouth Cortrak Secured At:  65 cm   No x-ray is required. RN may begin using tube.   If the tube becomes dislodged please keep the tube and contact the Cortrak team at [email or web address] ([email or web address]) for replacement.  If after hours and replacement cannot be delayed, place a NG tube and confirm placement with an abdominal x-ray.   Mariana Single RD, LDN Clinical Nutrition Pager # [phone]

## 2018-09-24 ENCOUNTER — Inpatient Hospital Stay (HOSPITAL_COMMUNITY): Payer: Medicare PPO | Admitting: Speech Pathology

## 2018-09-24 ENCOUNTER — Inpatient Hospital Stay (HOSPITAL_COMMUNITY): Payer: Medicare PPO | Admitting: Occupational Therapy

## 2018-09-24 ENCOUNTER — Inpatient Hospital Stay (HOSPITAL_COMMUNITY): Payer: Medicare PPO

## 2018-09-24 DIAGNOSIS — G9341 Metabolic encephalopathy: Secondary | ICD-10-CM

## 2018-09-24 DIAGNOSIS — L97519 Non-pressure chronic ulcer of other part of right foot with unspecified severity: Secondary | ICD-10-CM

## 2018-09-24 LAB — COMPREHENSIVE METABOLIC PANEL
ALT: 71 U/L — ABNORMAL HIGH (ref 0–44)
AST: 53 U/L — ABNORMAL HIGH (ref 15–41)
Albumin: 2.5 g/dL — ABNORMAL LOW (ref 3.5–5.0)
Alkaline Phosphatase: 121 U/L (ref 38–126)
Anion gap: 13 (ref 5–15)
BUN: 19 mg/dL (ref 8–23)
CO2: 23 mmol/L (ref 22–32)
Calcium: 8.7 mg/dL — ABNORMAL LOW (ref 8.9–10.3)
Chloride: 107 mmol/L (ref 98–111)
Creatinine, Ser: 1.51 mg/dL — ABNORMAL HIGH (ref 0.44–1.00)
GFR calc Af Amer: 36 mL/min — ABNORMAL LOW (ref >60)
GFR calc non Af Amer: 31 mL/min — ABNORMAL LOW (ref >60)
Glucose, Bld: 130 mg/dL — ABNORMAL HIGH (ref 70–99)
Potassium: 4 mmol/L (ref 3.5–5.1)
Sodium: 143 mmol/L (ref 135–145)
Total Bilirubin: 0.8 mg/dL (ref 0.3–1.2)
Total Protein: 6 g/dL — ABNORMAL LOW (ref 6.5–8.1)

## 2018-09-24 LAB — PHOSPHORUS: Phosphorus: 2.6 mg/dL (ref 2.5–4.6)

## 2018-09-24 LAB — CK: Total CK: 37 U/L — ABNORMAL LOW (ref 38–234)

## 2018-09-24 LAB — MAGNESIUM: Magnesium: 1.3 mg/dL — ABNORMAL LOW (ref 1.7–2.4)

## 2018-09-24 LAB — TROPONIN I: Troponin I: <0.03 ng/mL (ref <0.03)

## 2018-09-24 LAB — CORTISOL: Cortisol, Plasma: 25.3 ug/dL

## 2018-09-24 MED ORDER — ORAL CARE MOUTH RINSE
15.0000 mL | Freq: Two times a day (BID) | OROMUCOSAL | Status: DC
  Administered 2018-09-24 – 2018-10-10 (×30): 15 mL via OROMUCOSAL

## 2018-09-24 MED ORDER — MAGNESIUM SULFATE 2 GM/50ML IV SOLN
2.0000 g | Freq: Once | INTRAVENOUS | Status: AC
  Administered 2018-09-24: 2 g via INTRAVENOUS
  Filled 2018-09-24: qty 50

## 2018-09-24 NOTE — Progress Notes (Signed)
Occupational Therapy Session Note  Patient Details  Name: Maureen Stout MRN: [id number] Date of Birth: [birth year]  Today's Date: 09/24/2018 OT Individual Time: 1105-1130 OT Individual Time Calculation (min): 25 min  and Today's Date: 09/24/2018 OT Missed Time: 35 Minutes Missed Time Reason: Other (comment)(restless, difficulty following commands)   Short Term Goals: Week 1:  OT Short Term Goal 1 (Week 1): Pt will complete bathing at sit > stand level with min assist OT Short Term Goal 2 (Week 1): Pt will complete toilet transfers with min assist  OT Short Term Goal 3 (Week 1): Pt will complete LB dressing with min assist OT Short Term Goal 4 (Week 1): Pt will maintain sitting balance at EOB 5 mins with supervision to engage in self-care tasks  Skilled Therapeutic Interventions/Progress Updates:    Limited treatment session due to pt with increased restlessness and decreased ability to follow commands.  Pt received awake in bed upon arrival.  When asked if pt wanted to wash and don pants, pt stated "yes".  Attempted to engage pt in bed mobility to come to sitting at EOB.  Pt resistive of assistance and would bring legs back on to bed after therapist assisted in positioning off EOB.  Pt rolled in to sidelying with use of bed rail and scooted up in bed.  When asked again if pt wanted to don pants, she stated "no".  Therefore therapist attempted to reposition bedrail.  Pt then attempting to sit up again and when asked about dressing, she stated "yes".  Therapist assisted pt in sitting to EOB, however pt again resisting and returned to sidelying.  Pt stating " no" to pants.  Therapist positioned pt in sidelying and left with 4 side rails up due to restlessness and all needs in reach.  Notified RN of pt current status.  Therapy Documentation Precautions:  Precautions Precautions: Fall Restrictions Weight Bearing Restrictions: No General: General OT Amount of Missed Time: 35 Minutes Pain:  Pt with  no c/o pain   Therapy/Group: Individual Therapy  Simonne Come 09/24/2018, 12:17 PM

## 2018-09-24 NOTE — Plan of Care (Signed)
  Problem: RH BOWEL ELIMINATION Goal: RH STG MANAGE BOWEL WITH ASSISTANCE Description STG Manage Bowel with Min Assistance.  Outcome: Not Progressing; incontinence   Problem: RH BLADDER ELIMINATION Goal: RH STG MANAGE BLADDER WITH ASSISTANCE Description STG Manage Bladder With Min Assistance  Outcome: Not Progressing; in and out cath   Problem: RH KNOWLEDGE DEFICIT GENERAL Goal: RH STG INCREASE KNOWLEDGE OF SELF CARE AFTER HOSPITALIZATION Outcome: Not Progressing; confused

## 2018-09-24 NOTE — Progress Notes (Signed)
Subjective: Maureen Stout was seen and evaluated this morning. She is awake. Response some of my questions in 1 word. When asking if she is doing ok, she says: "yes". Asking her if she is in pain, she says" No". Unfortunately the NG tube is again out and only the loop is attached through her nose. This is the third time this happens. She is restrained now.Per nurse, core track team is informed and is going to come today again for reinsertion.  I cut the loop and removed it gently.     Objective:  Vital signs in last 24 hours: Vitals:   09/23/18 2025 09/24/18 0024 09/24/18 0029 09/24/18 0413  BP: (!) 171/77 (!) 173/80 (!) 161/85 (!) 164/80  Pulse: 79 79 79 80  Resp: 18   20  Temp: (!) 97.4 F (36.3 C)   97.9 F (36.6 C)  TempSrc: Oral   Oral  SpO2: 93%   91%  Weight:      Height:       General: Lying in the bed no acute distress, but appears ill and malnourished. CV: RRR, normal S1-S2, no murmur Pulmonary exam: Normal work of breathing, no crackles no wheezing Abdomen: Soft and nontender to palpation, BS are present Extremities: Has 2+ nonpitting edema in bilateral lower extremities. Also has nonpitting edema on her hands. Extremities are warm and pulses are present bilaterally. Neurology exam: Patient is alert but disoriented. She moves all 4 extremities  Assessment/Plan:  Active Problems:   Debility   Acute pancreatitis   Anasarca   Anemia of chronic disease   AKI (acute kidney injury) ([place])   Stage 3 chronic kidney disease (HCC)   Urinary retention   Encephalopathy   Failure to thrive (child)   Recurrent UTI   CKD (chronic kidney disease), stage III (HCC)   Orthostasis   Hypokalemia  85 year old woman initially admitted to our service with sepsis due to a Enterobacter UTI with resulting bacteremia and accompanied by acute encephalopathy.   Recommendations by problem are as follows:  Hypothermia/Encephalopathy: No further hypothermia. Likely delirium 2/2 acute  illness.  No focal neurologic abnormality on exam. Head CT scan negative. No evidence of infection but will monitor and will have low threshold for infection work up given her high risk for recurrent UTI. Unfortunately nutrition and feeding has been an issues. NG tube maintenance has been a problem.   Checking labs today. -Continue amoxicillin -Continue delirium precautions (please open blinds daily in the morning, encourage being awake during the day and sleeping at night) -if patient becomes hypothermic again would verify with rectal temp -if rectal temp consistent with hypothermia would repeat urinalysis and urine culture and blood cultures -Continue to ensure adequate bowel and bladder function with intermittent catheterization and bowel regimen as needed, but please avoid indwelling Foley given her risk for recurrent UTI -Repleating electrolytes -Encourage to eat and drink if cooperative and no evidence of aspiration  Severe Malnutrition: Unfortunately, NG tube does not appear to be an option for her currently with her delirium.  She pulled that out NG x 3. Recommending keep restrain  Appreciate nutrition consultation.  If she is not able to tolerate an NG tube or resume eating and drinking, will need to consider palliative care consult and consideration of hospice.  Pancreatitis:resolved  WVP:XTGGYIRSW in sinus rhythm -Continue eliquis, amiodarone  NIO:EVOJJ function stable, Cr 1.5  May be retaining some volume -Continue to monitor BMP -Gentle IV fluids with care given her CKD and HFpEF, but  balancing her poor intake  Anasarca:in the setting of malnutrition and CKD, pt with severely low albumin -Continue working on nutrition and fluid balance as above  HFpEF: volume status stable. Anasarca and non pitting edema seems to mostly be be in setting of low albumin Anasarca: in the setting of malnutrition and CKD, and severly low albumin -nutrition  Urinary Retention:  continues to be an issue, possibly related to her current mental status as well -Continue bladder scans and in and out cath -Avoid foley, since she has high risk for infection with indwelling catheterization  We will continue to follow  Dispo: Anticipated discharge depend on clinical improvement  Maureen Hatch, MD 09/24/2018, 4:55 AM Pager: [phone]

## 2018-09-24 NOTE — Progress Notes (Signed)
Speech Language Pathology Daily Session Note  Patient Details  Name: MAHOGANI HOLOHAN MRN: [id number] Date of Birth: [birth year]  Today's Date: 09/24/2018 SLP Individual Time: 0715-0740 SLP Individual Time Calculation (min): 25 min  Short Term Goals: Week 1: SLP Short Term Goal 1 (Week 1): Patient will demonstrate sustained attention to functional tasks for 30 minutes with Min A verbal cues for redirection SLP Short Term Goal 2 (Week 1): Patient will demonstrate functional problem solving for basic and familair tasks with Mod A verbal cues.  SLP Short Term Goal 3 (Week 1): Patient will utilize external aids to recall new, daily information with Max A multimodal cues.  SLP Short Term Goal 4 (Week 1): Patient wil follow multi-step commands with Mod A verbal and visual cues.  SLP Short Term Goal 5 (Week 1): Patient will verbalize basic information/wants and needs at the phrase level with Mod A verbal cues for word-finding.   Skilled Therapeutic Interventions: Skilled treatment session focused on cognitive goals. SLP facilitated session by providing total hand over hand assist to initiate and follow basic 1-step commands. Patient was lethargic and required Max verbal cues for focused attention to basic tasks and to pictures of family members. Patient was essentially nonverbal despite Max A multimodal cues and only intermittently answered "yes" and called this clinician "Mechele Claude." Patient left upright in bed with alarm on, restraints in place and all needs within reach. Continue with current plan of care.      Pain No/Denies Pain   Therapy/Group: Individual Therapy  Lakeeta Dobosz 09/24/2018, 8:30 AM

## 2018-09-24 NOTE — Plan of Care (Signed)
  Problem: RH SAFETY Goal: RH STG ADHERE TO SAFETY PRECAUTIONS W/ASSISTANCE/DEVICE Description STG Adhere to Safety Precautions With Min Assistance/Device.  Outcome: Progressing   Problem: RH PAIN MANAGEMENT Goal: RH STG PAIN MANAGED AT OR BELOW PT'S PAIN GOAL Description Patient will be pain free or pain less than 3 during admission  Outcome: Progressing   Problem: RH BOWEL ELIMINATION Goal: RH STG MANAGE BOWEL WITH ASSISTANCE Description STG Manage Bowel with Min Assistance.  Outcome: Not Progressing Note:  Patient incontinent of loose stool    Problem: RH BLADDER ELIMINATION Goal: RH STG MANAGE BLADDER WITH ASSISTANCE Description STG Manage Bladder With Min Assistance  Outcome: Not Progressing Note:  Requires in and out catheterization every 6 hours   Problem: RH SKIN INTEGRITY Goal: RH STG SKIN FREE OF INFECTION/BREAKDOWN Description With mod. assist  Outcome: Not Progressing Note:  Moisture-associated skin breakdown/irritation from stool incontinence

## 2018-09-24 NOTE — Progress Notes (Addendum)
[facility] PHYSICAL MEDICINE & REHABILITATION PROGRESS NOTE  Subjective/Complaints: Patient had a reasonable night after initially pulling out her NG tube.  Oxygen saturations dipped into the high 80s  ROS: Limited due to cognitive/behavioral   Objective: Vital Signs: Blood pressure (!) 164/80, pulse 80, temperature 97.9 F (36.6 C), temperature source Oral, resp. rate 20, height 5\' 6"  (1.676 m), weight 81.2 kg, SpO2 93 %. Dg Abd 1 View  Result Date: 09/22/2018 CLINICAL DATA:  Feeding tube placement. EXAM: ABDOMEN - 1 VIEW FLUOROSCOPY TIME:  Fluoroscopy Time: 1 minutes 24 seconds Radiation Exposure Index: 782.39 microGray*m^2 COMPARISON:  CT abdomen and pelvis 09/12/2018 FINDINGS: A single fluoroscopic image is provided. A feeding tube terminates in the central abdomen just left of midline near the ligament of Treitz, and injected contrast opacifies small bowel. IMPRESSION: Feeding tube placement as above. Electronically Signed   By: Logan Bores M.D.   On: 09/22/2018 15:09   Recent Labs    09/22/18 1202  WBC 5.1  HGB 7.9*  HCT 24.7*  PLT 196   Recent Labs    09/23/18 0434 09/24/18 0355  NA 143 143  K 3.3* 4.0  CL 103 107  CO2 24 23  GLUCOSE 62* 130*  BUN 15 19  CREATININE 1.40* 1.51*  CALCIUM 8.9 8.7*    Physical Exam: BP (!) 164/80 (BP Location: Left Arm)   Pulse 80   Temp 97.9 F (36.6 C) (Oral)   Resp 20   Ht 5\' 6"  (1.676 m)   Wt 81.2 kg   SpO2 93%   BMI 28.89 kg/m  Constitutional: No distress . Vital signs reviewed. HEENT: EOMI, oral membranes moist.  NG tube still in place but pulled out of nose partially Neck: supple Cardiovascular: RRR without murmur. No JVD    Respiratory: CTA Bilaterally without wheezes or rales. Normal effort    GI: BS +, non-tender, non-distended . Musculoskeletal: Generalized edema, improving Neurological:  Alert, remains confused.  Nods head yes to most questions.   Motor: Grossly 4-/5 throughout, appears to be stable Skin: Frail  skin. Psychiatric: Flat  Assessment/Plan: 1. Functional deficits secondary to debility and encephalopathy which require 3+ hours per day of interdisciplinary therapy in a comprehensive inpatient rehab setting.  Physiatrist is providing close team supervision and 24 hour management of active medical problems listed below.  Physiatrist and rehab team continue to assess barriers to discharge/monitor patient progress toward functional and medical goals  Care Tool:  Bathing  Bathing activity did not occur: Refused Body parts bathed by patient: Face         Bathing assist Assist Level: Supervision/Verbal cueing     Upper Body Dressing/Undressing Upper body dressing Upper body dressing/undressing activity did not occur (including orthotics): Refused What is the patient wearing?: Hospital gown only    Upper body assist Assist Level: Maximal Assistance - Patient 25 - 49%    Lower Body Dressing/Undressing Lower body dressing    Lower body dressing activity did not occur: Refused What is the patient wearing?: Pants     Lower body assist Assist for lower body dressing: Maximal Assistance - Patient 25 - 49%     Toileting Toileting Toileting Activity did not occur (Clothing management and hygiene only): Safety/medical concerns  Toileting assist Assist for toileting: Total Assistance - Patient < 25%     Transfers Chair/bed transfer  Transfers assist     Chair/bed transfer assist level: Maximal Assistance - Patient 25 - 49%     Locomotion Ambulation  Ambulation assist      Assist level: Moderate Assistance - Patient 50 - 74% Assistive device: Walker-rolling Max distance: 5(8' in parallel bars)   Walk 10 feet activity   Assist  Walk 10 feet activity did not occur: Safety/medical concerns        Walk 50 feet activity   Assist Walk 50 feet with 2 turns activity did not occur: Safety/medical concerns         Walk 150 feet activity   Assist Walk 150  feet activity did not occur: Safety/medical concerns         Walk 10 feet on uneven surface  activity   Assist Walk 10 feet on uneven surfaces activity did not occur: Safety/medical concerns         Wheelchair     Assist Will patient use wheelchair at discharge?: No             Wheelchair 50 feet with 2 turns activity    Assist            Wheelchair 150 feet activity     Assist            Medical Problem List and Plan: 1.Functional deficits and weaknesssecondary to debility and encephalopathy after urosepsis/pancreatitis and multiple medical issues  Cont CIR, made 15/7  Ongoing confusion.  Requires wrist restraints.  Will consider palliative care consult next week pending response to tube feeds 2. Antithrombotics: -PAF/DVT/anticoagulation:Pharmaceutical:Other (comment)- Eliquis   -Finally took Eliquis last night when given to her whole -antiplatelet therapy:  3. Pain Management:Tylenol prn. 4. Mood:LCSW to follow for evaluation and support. -antipsychotic agents: N/A 5. Neuropsych: This patientisnot fully capable of making decisions on herown behalf. 6. Skin/Wound Care:Routine pressure relief measures. 7. Fluids/Electrolytes/Nutrition:Strict I/O.   Extremely poor PO intake, related to cognition +/- nausea - will not schedule antiemetics secondary to AMS.     NGT placed yesterday, however patient pulled overnight once again yesterday evening--replaced today    -IV fluid restarted yesterday evening 8. Enterococcus bacteremia/UTI: Completed 14-day course of Maxipime   Amoxicillin 500 mg nightly started on 5/7 9. Anasarca/FLuid overload: Added heart healthy restrictions. Weight daily. Lasix daily prn depending stability of weights/symptoms.  Filed Weights   09/22/18 0500 09/23/18 0404 09/24/18 0413  Weight: 81.5 kg 82.3 kg 81.2 kg   Stable on 5/9 10. PAF: Monitor HR bid--back on amiodarone and Eliquis.  Off metoprolol due to bradycardia. 11. Urinary retention with recurrent UTIs:   PVRs showing retention, however inconsistent readings and asynchronous with I/O caths  Bladder prolapse, will follow-up  Amoxicillin 500 mg nightly started on 5/7 per IM  Cath for volumes > 350 cc. 12. Acute on Chronic renal failure: SCr- 1.32 on 04/2018. Continue to monitor with routine checks.   Creatinine at 1.5 today which is near her baseline 13. Delirium: Resolving.  14. Anemia of chronic disease: Baseline Hg around 9.0? Likely dropafterhydration.   Monitor for signs of bleeding.   Hemoglobin 8.1 on 5/6  Continue to monitor 15. Acute pancreatitis: Offer supplements with meals.   LFTs elevated on 5/6  Amylase/lipase within normal limits  Ammonia WNL  Continue to monitor 16. Hypothyroidism: Now synthroid 75 mcg/day. 17.  Orthostasis  Labile with hypertensive crisis overnight, IVF DC'd 18.  Hypokalemia  Potassium up to 4.0 today   Labs ordered for Monday  Supplement increased on 5/7  IV supplement ordered on 5/8 19. Hypotheramia  Did not require bear hugger last night, cont to monitor 20.  Nocturnal  hypoxia: Oxygen via nasal cannula as needed to keep saturation greater than 90%  LOS: 5 days A FACE TO [place] 09/24/2018, 8:25 AM

## 2018-09-24 NOTE — Progress Notes (Addendum)
Followed up cortrak team re: insertion of cortrak ;  on [facility]  sched it says no service for weekend ; paged one of their numbers. MD notified new orders noted.

## 2018-09-24 NOTE — Progress Notes (Signed)
Internal Medicine Attending:   I saw and examined the patient. I reviewed the resident's note and I agree with the resident's findings and plan as documented in the resident's note.  Maureen Stout remains encephalopathic this morning.  She denies any acute complaints.  On examination she is in no respiratory distress, heart is regular rate and rhythm she has no abdominal tenderness lungs are clear to auscultation.  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  She does have generalized edema.  I have been told that she has pulled out her NG tube multiple times.  Assessment and plan Acute metabolic encephalopathy -No clear signs of recurrent infection, there is no evidence of recurrent pancreatitis.  We obtained troponin CK mag and phosphorus this morning to expand work-up she was a little bit hypo-Magness anemic and we are replacing this today.  Overall we cannot find any clear source of a reversible cause at this time certainly her poor nutritional status is playing a role.  We will try to replace the NG tube again today to see if improving nutritional status improves however if this does not I cannot see any medical reason that she is declining to this extent and we may need to consider a more palliative approach.

## 2018-09-24 NOTE — Progress Notes (Signed)
Patient unable to take meds keeps spittiing it out and coughs after; Cortrak out only the end is hanging on her nose MD from acute snipped it out. Order is active for cortrak. Very edematous on upper arm.

## 2018-09-24 NOTE — Progress Notes (Signed)
Physical Therapy Session Note  Patient Details  Name: Maureen Stout MRN: [id number] Date of Birth: 10/13/1931  Today's Date: 09/24/2018 PT Individual Time: 1455-1535 PT Individual Time Calculation (min): 40 min   Short Term Goals: Week 1:  PT Short Term Goal 1 (Week 1): pt will perform functional transfers with min A PT Short Term Goal 2 (Week 1): pt will perform gait x 15' in controlled environment with min A  Skilled Therapeutic Interventions/Progress Updates:     Patient in bed upon PT arrival. Patient lethargic, but responsive to PT's voice. Patient unable to state her name and perseverating on the word "yes" today, asked several orientation questions and patient responded yes each time. Patient was oriented to self, time, place, and situation by PT at beginning of session. PT explained the purpose of the NG tube and restraints and patient was restless and pulling against the restraints. RN came in to change IV dressing during session, missed 5 minutes of skilled PT due to nursing care. B UE restraints removed during session for mobility.   Therapeutic Activity: Bed Mobility: Patient performed supine to/from sit with max A for LE and trunk management. She would initiate movement then fail to follow through. Provided verbal cues for moving LEs and pushing up from the bed to sit up. She performed scooting to EOB x5 with mod A with increased time to initiate movement.  Transfers: Patient attempted sit to/from stand x2 with max A. During first trial she was unable to lift her bottom off of the bed and during the second trial she was able to clear her bottom 2" and shift up in the bed.  Provided verbal cues for leaning forward and pushing up through her legs.  Therapeutic Exercise: Patient performed the following exercises with verbal and tactile cues for proper technique. -B LAQs x20 with PT's hand as a target and the cue of "kick"  -attempted marching in sitting with PT hand as a target for her  knee and demonstration, patient unable to follow cues and continued to "kick" performing LAQs.   Patient in bed at end of session with breaks locked, bed alarm set, B UE restraints re-applied, R UE and B LE elevated on pillows, and all needs within reach.    Therapy Documentation Precautions:  Precautions Precautions: Fall Restrictions Weight Bearing Restrictions: No General: PT Amount of Missed Time (min): 5 Minutes PT Missed Treatment Reason: Nursing care(change IV dressing) Pain:  Patient displayed no outward signs of pain throughout session.    Therapy/Group: Individual Therapy  [name] [name] PT, DPT  09/24/2018, 3:10 PM

## 2018-09-25 ENCOUNTER — Inpatient Hospital Stay (HOSPITAL_COMMUNITY): Payer: Medicare PPO

## 2018-09-25 ENCOUNTER — Inpatient Hospital Stay (HOSPITAL_COMMUNITY): Payer: Medicare PPO | Admitting: Occupational Therapy

## 2018-09-25 DIAGNOSIS — Z931 Gastrostomy status: Secondary | ICD-10-CM

## 2018-09-25 LAB — BASIC METABOLIC PANEL
Anion gap: 9 (ref 5–15)
BUN: 22 mg/dL (ref 8–23)
CO2: 23 mmol/L (ref 22–32)
Calcium: 8.1 mg/dL — ABNORMAL LOW (ref 8.9–10.3)
Chloride: 109 mmol/L (ref 98–111)
Creatinine, Ser: 1.48 mg/dL — ABNORMAL HIGH (ref 0.44–1.00)
GFR calc Af Amer: 37 mL/min — ABNORMAL LOW (ref >60)
GFR calc non Af Amer: 32 mL/min — ABNORMAL LOW (ref >60)
Glucose, Bld: 109 mg/dL — ABNORMAL HIGH (ref 70–99)
Potassium: 3.8 mmol/L (ref 3.5–5.1)
Sodium: 141 mmol/L (ref 135–145)

## 2018-09-25 MED ORDER — PROCHLORPERAZINE EDISYLATE 10 MG/2ML IJ SOLN
5.0000 mg | Freq: Four times a day (QID) | INTRAMUSCULAR | Status: DC | PRN
  Administered 2018-10-01: 5 mg via INTRAMUSCULAR
  Filled 2018-09-25: qty 2

## 2018-09-25 MED ORDER — PROCHLORPERAZINE 25 MG RE SUPP: 12.5000 mg | Freq: Four times a day (QID) | RECTAL | Status: DC | PRN

## 2018-09-25 MED ORDER — PROCHLORPERAZINE MALEATE 5 MG PO TABS
5.0000 mg | ORAL_TABLET | Freq: Four times a day (QID) | ORAL | Status: DC | PRN
  Administered 2018-09-27: 5 mg via ORAL
  Filled 2018-09-25 (×2): qty 1

## 2018-09-25 MED ORDER — GERHARDT'S BUTT CREAM
TOPICAL_CREAM | Freq: Two times a day (BID) | CUTANEOUS | Status: DC
  Administered 2018-09-25 – 2018-10-03 (×17): via TOPICAL
  Administered 2018-10-03 – 2018-10-04 (×2): 1 via TOPICAL
  Administered 2018-10-04 – 2018-10-10 (×12): via TOPICAL
  Filled 2018-09-25 (×2): qty 1

## 2018-09-25 NOTE — Progress Notes (Signed)
Subjective: Patient was seen and evaluated at bedside on morning rounds. No acute events overnight. She is sitting in the chair. Denies any pain.  Objective:  Vital signs in last 24 hours: Vitals:   09/24/18 1848 09/24/18 2054 09/24/18 2153 09/25/18 0332  BP:  (!) 152/70  137/70  Pulse:  73  81  Resp:  12  15  Temp:   (!) 97.5 F (36.4 C) 97.8 F (36.6 C)  TempSrc:   Axillary Axillary  SpO2: 91% 93%  94%  Weight:    82.5 kg  Height:       General:Ill appearing and malnourished. no acute distress, but appears ill and malnourished. NG tube is in place. CV: RRR, normal S1-S2, no murmur Pulmonary exam: Normal work of breathing, no cracklesno wheezing Abdomen:Soft and nontender to palpation, BS are present Extremities: Has2+ nonpitting edema in bilateral lower extremities. (Improved today)  Extremities are warm and pulses are present bilaterally. Neurology exam: Patient is alert but disoriented. She movesall 4extremities. She follows the commands today.  Assessment/Plan:  Active Problems:   Debility   Acute pancreatitis   Anasarca   Anemia of chronic disease   AKI (acute kidney injury) ([place])   Stage 3 chronic kidney disease (HCC)   Urinary retention   Encephalopathy   Failure to thrive (child)   Recurrent UTI   CKD (chronic kidney disease), stage III (HCC)   Orthostasis   Hypokalemia   85 year old woman initially admitted to our service with sepsis due to a Enterobacter UTI with resulting bacteremia and accompanied by acute encephalopathy.  Hypothermia/Encephalopathy: No further hypothermia. Likely delirium 2/2 acute illness. No other underlying cause or reversible cause found. No focal neurologic abnormality on exam. Head CT scan negative. No adrenal insufficiency as Cortisol level is not low. No evidence of infection but will monitor and will have low threshold for infection work up given her high risk for recurrent UTI. Labs are stable, will replete  electrolytes as needed.  NG tube placed again. We hope that she shows improvement with giving her some nutritions through NG tube now.  -Continue amoxicillin -Continue delirium precautions(please open blinds daily in the morning, encourage being awake during the day and sleeping at night) -ifpatientbecomes hypothermic again would verify with rectal temp -if rectal temp consistent with hypothermia would repeat urinalysis and urine culture and blood cultures -Continue to ensure adequate bowel and bladder function with intermittent catheterization and bowel regimen as needed, but please avoid indwelling Foley given her risk for recurrent UTI -Repleating electrolytes -Encourage to eat and drink if cooperative and no evidence of aspiration -repeating TSH, T4, T3 today. -Continue levothyroxine  SevereMalnutrition: Has NG tube currently Recommending keep restrain  -Continue nutritions   Pancreatitis:resolved  P. AFib:currently in sinus rhythm -Continue eliquis, amiodarone  LTJ:QZESP function stable, Cr 1.5  May be retaining some volume -Continue to monitorBMP -Gentle IV fluids with care given her CKD and HFpEF, but balancing her poor intake  Anasarca:in the setting of malnutrition and CKD, pt with severely low albumin. Improved today. -Continue working on nutrition and fluid balance as above  HFpEF: volume status stable. Anasarca and non pitting edema seems to mostly be be in setting of low albumin  Anasarca:in the setting of malnutrition and CKD, and severly low albumin -nutrition  Urinary Retention: continues to be an issue, possibly related to her current mental status as well  -Continue bladder scans and in and out cath -Avoid foley, since she has high risk for infection with indwelling  catheterization   Dispo: Anticipated discharge depend on clinical improvement   Dewayne Hatch, MD 09/25/2018, 6:04 AM Pager: [phone]

## 2018-09-25 NOTE — Progress Notes (Signed)
Physical Therapy Session Note  Patient Details  Name: Maureen Stout MRN: [id number] Date of Birth: Oct 04, 1931  Today's Date: 09/25/2018 PT Individual Time: 0800-0900 PT Individual Time Calculation (min): 60 min   Short Term Goals: Week 1:  PT Short Term Goal 1 (Week 1): pt will perform functional transfers with min A PT Short Term Goal 2 (Week 1): pt will perform gait x 15' in controlled environment with min A  Skilled Therapeutic Interventions/Progress Updates:     Patient in bed asleep upon PT arrival. Patient easily aroused to PT calling her name and agreeable to PT session. Continues to perseverate on "yes" today, however able to answer no when asked if she wanted any food and "I think so" when asked if she was comfortable. All restraints were removed at beginning of session for mobility.   Therapeutic Activity: Bed Mobility: Patient performed supine to sit with mod A with HOB elevated to 30 degrees. Provided verbal cues for moving LEs off the bed and pushing to sit up. Patient sat EOB for 4 minutes with min A-CGA with cues for erect posture. Transfers: Patient performed sit to/from stand x2 and a stand pivot transfer with the bed elevated x1 with mod-max A using the RW. Provided verbal cues for hand placement and erect posture.  Patient in recliner at end of session with breaks locked, seat belt alarm set, B UE restraints and hand mitts donned with the door open, RN made aware patient OOB.    Therapy Documentation Precautions:  Precautions Precautions: Fall Restrictions Weight Bearing Restrictions: No Pain:  Patient displayed no outward signs of distress or pain throughout session. Unable to rate pain due to cognitive deficits.     Therapy/Group: Individual Therapy  [place] Shannahan L Ilan Kahrs PT, DPT  09/25/2018, 12:29 PM

## 2018-09-25 NOTE — Progress Notes (Signed)
MASD noted on patient's bottom, vaginal area gerhardts cream placed turned patient to her side maintaining restraints.

## 2018-09-25 NOTE — Plan of Care (Signed)
  Problem: RH BLADDER ELIMINATION Goal: RH STG MANAGE BLADDER WITH ASSISTANCE Description STG Manage Bladder With Min Assistance  Outcome: Not Progressing; I and o cath

## 2018-09-25 NOTE — Progress Notes (Signed)
Internal Medicine Attending:   I saw and examined the patient. I reviewed Dr Darcey Nora note and I agree with the resident's findings and plan as documented in the resident's note.  Patient is sitting up in bedside chair today NG tube is in place.  She seems more oriented and conversant today however still not fully oriented.  We will continue supportive care most notably through nutrition with NG tube.

## 2018-09-25 NOTE — Progress Notes (Signed)
Occupational Therapy Session Note  Patient Details  Name: Maureen Stout MRN: [id number] Date of Birth: May 14, 1932  Today's Date: 09/25/2018 OT Individual Time: 1045-1130 and 1220-1257 OT Individual Time Calculation (min): 45 min and 37 min  Short Term Goals: Week 1:  OT Short Term Goal 1 (Week 1): Pt will complete bathing at sit > stand level with min assist OT Short Term Goal 2 (Week 1): Pt will complete toilet transfers with min assist  OT Short Term Goal 3 (Week 1): Pt will complete LB dressing with min assist OT Short Term Goal 4 (Week 1): Pt will maintain sitting balance at EOB 5 mins with supervision to engage in self-care tasks  Skilled Therapeutic Interventions/Progress Updates:    Session One: PT seen for OT session focusing on functioanl engagement and attention tot task. PT sitting up in recliner upon arrival, complaints of "being hot"otherwise agreeable to tx session. She washed her face with set-up of wash cloth and VCs, assist to ensure no pulling of NG tube, completed x2 during session. Completed 3x sit>stand from recliner to RW with max A each trial. Pt tolerating ~15 seconds in standing before requiring seated rest break. Min-mod A for static standing balance 2/2 posterior bias with VCs for weight shift. Attempted to have pt don pants. With significantly increased time, pt able to initiate anterior lean in order to thread pants on, however, ultimately required therapist's assist while pt lifted leg into pants hole. With fatigue, unable to stand erect enough to get pants completely over hips. Ultimately used STEDY with max A to stand and transfer back to bed as pt began closing her eyes and having difficult time maintaining arousal. +2 for transfer back to bed and to position in supine. Pt already asleep while therapist fastened hand mitts and wrist restraints per order. Vitals in supine, BP 150/65, HR 80 Pt left in supine resting. Will attempt to make up time as able.   Session  Two: Therapist returned to have lunch with pt. Pt asleep in supine, but easily awoken and agreeable to transfer to EOB for meal. Transferred to sitting with mod A using hospital bed functions. She tolerated sitting EOB ~30 minutes to eat lunch. Initially required total A for initiation of scooping ice cream, however, progressed to scooping minimal amount of ice cream onto utensil herself with significantly increased time and encouragement required. She refused to eat any other parts of her meal and ate ~10% of Magic Cup in 30 minute time period.  Pt appearing tearful throughout session, however, when asked "what is wrong" or "are you in pain", pt would state "I just don't know" or "I'm really sick" She was oriented to place this session. Pt then stated she was done with meal and requesting to return to supine. Mod A for return to supine. Pt left sitting up in bed with restraints and mitts on, bed alarm activated.   Therapy Documentation Precautions:  Precautions Precautions: Fall Restrictions Weight Bearing Restrictions: No   Therapy/Group: Individual Therapy  Orvel Cutsforth L 09/25/2018, 6:53 AM

## 2018-09-25 NOTE — Progress Notes (Signed)
[facility] PHYSICAL MEDICINE & REHABILITATION PROGRESS NOTE  Subjective/Complaints: Pt slept thru the night. No new issues reported by RN. NGT stayed in! Coretrak replacement not avail this weekend  ROS: Limited due to cognitive/behavioral    Objective: Vital Signs: Blood pressure 137/70, pulse 81, temperature 97.8 F (36.6 C), temperature source Axillary, resp. rate 15, height 5\' 6"  (1.676 m), weight 82.5 kg, SpO2 94 %. Dg Abd 1 View  Result Date: 09/24/2018 CLINICAL DATA:  85 year old female with gastric tube placement EXAM: ABDOMEN - 1 VIEW COMPARISON:  09/22/2018 FINDINGS: Limited plain film abdomen demonstrates gastric tube terminating within the stomach in left upper quadrant. No distended stomach small bowel or colon. Aortic calcifications. Enteric tube has been removed. IMPRESSION: Gastric tube terminates within the stomach. Electronically Signed   By: Corrie Mckusick D.O.   On: 09/24/2018 13:12   Recent Labs    09/22/18 1202  WBC 5.1  HGB 7.9*  HCT 24.7*  PLT 196   Recent Labs    09/24/18 0355 09/25/18 0356  NA 143 141  K 4.0 3.8  CL 107 109  CO2 23 23  GLUCOSE 130* 109*  BUN 19 22  CREATININE 1.51* 1.48*  CALCIUM 8.7* 8.1*    Physical Exam: BP 137/70 (BP Location: Left Arm)   Pulse 81   Temp 97.8 F (36.6 C) (Axillary)   Resp 15   Ht 5\' 6"  (1.676 m)   Wt 82.5 kg   SpO2 94%   BMI 29.36 kg/m  Constitutional: No distress . Vital signs reviewed.wrist restraints HEENT: EOMI, oral membranes moist, NGT in place Neck: supple Cardiovascular: RRR without murmur. No JVD    Respiratory: CTA Bilaterally without wheezes or rales. Normal effort    GI: BS +, non-tender, non-distended  Musculoskeletal: Generalized edema, improving Neurological:  Awakens but remains confused.  Nods head yes to most questions.   Motor: Grossly 4-/5 throughout, appears to be stable Skin: Frail skin. Psychiatric: Flat  Assessment/Plan: 1. Functional deficits secondary to debility and  encephalopathy which require 3+ hours per day of interdisciplinary therapy in a comprehensive inpatient rehab setting.  Physiatrist is providing close team supervision and 24 hour management of active medical problems listed below.  Physiatrist and rehab team continue to assess barriers to discharge/monitor patient progress toward functional and medical goals  Care Tool:  Bathing  Bathing activity did not occur: Refused Body parts bathed by patient: Face         Bathing assist Assist Level: Supervision/Verbal cueing     Upper Body Dressing/Undressing Upper body dressing Upper body dressing/undressing activity did not occur (including orthotics): Refused What is the patient wearing?: Hospital gown only    Upper body assist Assist Level: Maximal Assistance - Patient 25 - 49%    Lower Body Dressing/Undressing Lower body dressing    Lower body dressing activity did not occur: Refused What is the patient wearing?: Pants     Lower body assist Assist for lower body dressing: Maximal Assistance - Patient 25 - 49%     Toileting Toileting Toileting Activity did not occur (Clothing management and hygiene only): Safety/medical concerns  Toileting assist Assist for toileting: Total Assistance - Patient < 25%     Transfers Chair/bed transfer  Transfers assist     Chair/bed transfer assist level: Maximal Assistance - Patient 25 - 49%     Locomotion Ambulation   Ambulation assist      Assist level: Moderate Assistance - Patient 50 - 74% Assistive device: Walker-rolling Max distance:  5(8' in parallel bars)   Walk 10 feet activity   Assist  Walk 10 feet activity did not occur: Safety/medical concerns        Walk 50 feet activity   Assist Walk 50 feet with 2 turns activity did not occur: Safety/medical concerns         Walk 150 feet activity   Assist Walk 150 feet activity did not occur: Safety/medical concerns         Walk 10 feet on uneven  surface  activity   Assist Walk 10 feet on uneven surfaces activity did not occur: Safety/medical concerns         Wheelchair     Assist Will patient use wheelchair at discharge?: No             Wheelchair 50 feet with 2 turns activity    Assist            Wheelchair 150 feet activity     Assist            Medical Problem List and Plan: 1.Functional deficits and weaknesssecondary to debility and encephalopathy after urosepsis/pancreatitis and multiple medical issues  Cont CIR, made 15/7  Ongoing confusion.  Requires wrist restraints. Not progressing from a cognitive standpoint.  Recent HCT reviewed. Called son who had questions about study and mother's state. Pt remains encephalopathic  Stopped remeron and reduced compazine   Recent cortisol level was normal. Check thyroid studies in AM  Remains on abx for urine/bladder covg. Will recheck ucx however  Will consider palliative care consult this week pending response to tube feeds 2. Antithrombotics: -PAF/DVT/anticoagulation:Pharmaceutical:Other (comment)- Eliquis   -Finally took Eliquis last night when given to her whole -antiplatelet therapy:  3. Pain Management:Tylenol prn. 4. Mood:LCSW to follow for evaluation and support. -antipsychotic agents: N/A 5. Neuropsych: This patientisnot fully capable of making decisions on herown behalf. 6. Skin/Wound Care:Routine pressure relief measures. 7. Fluids/Electrolytes/Nutrition:Strict I/O.   Extremely poor PO intake, related to cognition +/- nausea - will not schedule antiemetics secondary to AMS.     NGT placed yesterday, however patient pulled overnight once again yesterday evening--replaced today    -IV fluid restarted yesterday evening 8. Enterococcus bacteremia/UTI: Completed 14-day course of Maxipime   Amoxicillin 500 mg nightly started on 5/7 9. Anasarca/FLuid overload: Added heart healthy restrictions.  Weight daily. Lasix daily prn depending stability of weights/symptoms.  Filed Weights   09/23/18 0404 09/24/18 0413 09/25/18 0332  Weight: 82.3 kg 81.2 kg 82.5 kg   Stable on 5/10 10. PAF: Monitor HR bid--back on amiodarone and Eliquis. Off metoprolol due to bradycardia. 11. Urinary retention with recurrent UTIs:   PVRs showing retention, however inconsistent readings and asynchronous with I/O caths  Bladder prolapse, will follow-up  Amoxicillin 500 mg nightly started on 5/7 per IM  Cath for volumes > 350 cc. 12. Acute on Chronic renal failure: SCr- 1.32 on 04/2018. Continue to monitor with routine checks.   Creatinine at 1.5 today which is near her baseline 13. Delirium: Resolving.  14. Anemia of chronic disease: Baseline Hg around 9.0? Likely dropafterhydration.   Monitor for signs of bleeding.   Hemoglobin 8.1 on 5/6  Continue to monitor 15. Acute pancreatitis: Offer supplements with meals.   LFTs elevated on 5/6  Amylase/lipase within normal limits  Ammonia WNL  Continue to monitor 16. Hypothyroidism: Now synthroid 75 mcg/day. 17.  Orthostasis  Labile with hypertensive crisis overnight, IVF DC'd 18.  Hypokalemia  Potassium up to 4.0 today  Labs ordered for Monday  Supplement increased on 5/7  IV supplement ordered on 5/8 19. Hypotheramia  Did not require bear hugger last night, cont to monitor 20.  Nocturnal hypoxia: Oxygen via nasal cannula as needed to keep saturation greater than 90%  LOS: 6 days A FACE TO FACE EVALUATION WAS PERFORMED  Meredith Staggers 09/25/2018, 11:17 AM

## 2018-09-25 NOTE — Progress Notes (Signed)
Patient holds and spits out food; patient tolerates meds and osmolite via NGT. Restraints on.

## 2018-09-26 ENCOUNTER — Inpatient Hospital Stay (HOSPITAL_COMMUNITY): Payer: Medicare PPO | Admitting: Physical Therapy

## 2018-09-26 ENCOUNTER — Inpatient Hospital Stay (HOSPITAL_COMMUNITY): Payer: Medicare PPO | Admitting: Occupational Therapy

## 2018-09-26 ENCOUNTER — Inpatient Hospital Stay (HOSPITAL_COMMUNITY): Payer: Medicare PPO

## 2018-09-26 DIAGNOSIS — Z931 Gastrostomy status: Secondary | ICD-10-CM

## 2018-09-26 DIAGNOSIS — Z9189 Other specified personal risk factors, not elsewhere classified: Secondary | ICD-10-CM

## 2018-09-26 DIAGNOSIS — M7989 Other specified soft tissue disorders: Secondary | ICD-10-CM

## 2018-09-26 LAB — CBC WITH DIFFERENTIAL/PLATELET
Abs Immature Granulocytes: 0.19 10*3/uL — ABNORMAL HIGH (ref 0.00–0.07)
Basophils Absolute: 0 10*3/uL (ref 0.0–0.1)
Basophils Relative: 1 %
Eosinophils Absolute: 0 10*3/uL (ref 0.0–0.5)
Eosinophils Relative: 0 %
HCT: 28.7 % — ABNORMAL LOW (ref 36.0–46.0)
Hemoglobin: 9 g/dL — ABNORMAL LOW (ref 12.0–15.0)
Immature Granulocytes: 4 %
Lymphocytes Relative: 23 %
Lymphs Abs: 1.2 10*3/uL (ref 0.7–4.0)
MCH: 30 pg (ref 26.0–34.0)
MCHC: 31.4 g/dL (ref 30.0–36.0)
MCV: 95.7 fL (ref 80.0–100.0)
Monocytes Absolute: 0.5 10*3/uL (ref 0.1–1.0)
Monocytes Relative: 10 %
Neutro Abs: 3.1 10*3/uL (ref 1.7–7.7)
Neutrophils Relative %: 62 %
Platelets: 185 10*3/uL (ref 150–400)
RBC: 3 MIL/uL — ABNORMAL LOW (ref 3.87–5.11)
RDW: 18.6 % — ABNORMAL HIGH (ref 11.5–15.5)
WBC: 5 10*3/uL (ref 4.0–10.5)
nRBC: 0 % (ref 0.0–0.2)

## 2018-09-26 LAB — CBC
HCT: 25.7 % — ABNORMAL LOW (ref 36.0–46.0)
Hemoglobin: 8 g/dL — ABNORMAL LOW (ref 12.0–15.0)
MCH: 30.2 pg (ref 26.0–34.0)
MCHC: 31.1 g/dL (ref 30.0–36.0)
MCV: 97 fL (ref 80.0–100.0)
Platelets: 171 10*3/uL (ref 150–400)
RBC: 2.65 MIL/uL — ABNORMAL LOW (ref 3.87–5.11)
RDW: 18.8 % — ABNORMAL HIGH (ref 11.5–15.5)
WBC: 4.7 10*3/uL (ref 4.0–10.5)
nRBC: 0 % (ref 0.0–0.2)

## 2018-09-26 LAB — URINE CULTURE: Culture: 40000 — AB

## 2018-09-26 LAB — BASIC METABOLIC PANEL
Anion gap: 8 (ref 5–15)
Anion gap: 11 (ref 5–15)
BUN: 30 mg/dL — ABNORMAL HIGH (ref 8–23)
BUN: 27 mg/dL — ABNORMAL HIGH (ref 8–23)
CO2: 24 mmol/L (ref 22–32)
CO2: 24 mmol/L (ref 22–32)
Calcium: 8.2 mg/dL — ABNORMAL LOW (ref 8.9–10.3)
Calcium: 8.7 mg/dL — ABNORMAL LOW (ref 8.9–10.3)
Chloride: 108 mmol/L (ref 98–111)
Chloride: 104 mmol/L (ref 98–111)
Creatinine, Ser: 1.46 mg/dL — ABNORMAL HIGH (ref 0.44–1.00)
Creatinine, Ser: 1.53 mg/dL — ABNORMAL HIGH (ref 0.44–1.00)
GFR calc Af Amer: 38 mL/min — ABNORMAL LOW (ref >60)
GFR calc Af Amer: 36 mL/min — ABNORMAL LOW (ref >60)
GFR calc non Af Amer: 33 mL/min — ABNORMAL LOW (ref >60)
GFR calc non Af Amer: 31 mL/min — ABNORMAL LOW (ref >60)
Glucose, Bld: 103 mg/dL — ABNORMAL HIGH (ref 70–99)
Glucose, Bld: 120 mg/dL — ABNORMAL HIGH (ref 70–99)
Potassium: 4.5 mmol/L (ref 3.5–5.1)
Potassium: 4.6 mmol/L (ref 3.5–5.1)
Sodium: 140 mmol/L (ref 135–145)
Sodium: 139 mmol/L (ref 135–145)

## 2018-09-26 LAB — T4, FREE: Free T4: 1.33 ng/dL (ref 0.82–1.77)

## 2018-09-26 LAB — TSH: TSH: 4.341 u[IU]/mL (ref 0.350–4.500)

## 2018-09-26 NOTE — Progress Notes (Signed)
Speech Language Pathology Daily Session Note  Patient Details  Name: Maureen Stout MRN: [id number] Date of Birth: 24-Feb-1932  Today's Date: 09/26/2018 SLP Individual Time: 1000-1030 SLP Individual Time Calculation (min): 30 min  Short Term Goals: Week 1: SLP Short Term Goal 1 (Week 1): Patient will demonstrate sustained attention to functional tasks for 30 minutes with Min A verbal cues for redirection SLP Short Term Goal 2 (Week 1): Patient will demonstrate functional problem solving for basic and familair tasks with Mod A verbal cues.  SLP Short Term Goal 3 (Week 1): Patient will utilize external aids to recall new, daily information with Max A multimodal cues.  SLP Short Term Goal 4 (Week 1): Patient wil follow multi-step commands with Mod A verbal and visual cues.  SLP Short Term Goal 5 (Week 1): Patient will verbalize basic information/wants and needs at the phrase level with Mod A verbal cues for word-finding.   Skilled Therapeutic Interventions: Skilled ST services focused on swallow and cognitive skills. Pt was alert and agreeable to participate in ST services. SLP facilitated PO consumption of thin via straw and dys 3 snack, pt demonstrated x2 immediate cough on thin (initial reflexive cough and another noted during administration of medication in [place]), mild anterior spillage with dys 3 and appropriate oral clearance given extra time for mastication/attention to bolus. Pt demonstrated sustained attention during verbal questions, demonstrated immedaite recall in 3 out 4 words and delayed recall (5 minute delay) 4 out 4 words. Pt was left in room with call bell within reach and bed alarm set. ST recommends to continue skilled ST services.      Pain    Therapy/Group: Individual Therapy  Eligio Angert  [facility] 09/26/2018, 7:48 AM

## 2018-09-26 NOTE — Plan of Care (Signed)
  Problem: RH BOWEL ELIMINATION Goal: RH STG MANAGE BOWEL WITH ASSISTANCE Description STG Manage Bowel with Min Assistance.  Outcome: Progressing Goal: RH STG MANAGE BOWEL W/MEDICATION W/ASSISTANCE Description STG Manage Bowel with Medication with mod.Assistance.  Outcome: Progressing

## 2018-09-26 NOTE — Progress Notes (Signed)
Occupational Therapy Session Note  Patient Details  Name: Maureen Stout MRN: [id number] Date of Birth: 11-30-1931  Today's Date: 09/26/2018 OT Individual Time: 4585-9292 OT Individual Time Calculation (min): 28 min    Short Term Goals: Week 1:  OT Short Term Goal 1 (Week 1): Pt will complete bathing at sit > stand level with min assist OT Short Term Goal 2 (Week 1): Pt will complete toilet transfers with min assist  OT Short Term Goal 3 (Week 1): Pt will complete LB dressing with min assist OT Short Term Goal 4 (Week 1): Pt will maintain sitting balance at EOB 5 mins with supervision to engage in self-care tasks  Skilled Therapeutic Interventions/Progress Updates:    Patient supine in bed with bilateral wrist restraints - restraints removed for therapy session.  Noted significant edema bilateral hands/forearms ( right side greater than left)  Skin intact but wrist bands tight on skin - nursing notified.  Patient denies pain.  She is alert today and aware of surroundings, asking how long she has been in the hospital and unaware that she had been pulling at NG tube.  Patient able to wash her face.  Completed supine to SSP with mod A.  Able to tolerate unsupported sitting for 15 minutes with light UE mobility activities.  Sit to stand with CG/min A, able to side step toward [facility] with CGA.  Patient fatigues quickly and returned to supine position with min A.  Placed bilateral UEs on elevated surface and spoke with nurse about restraints as they would not allow for appropriate elevation.  Patient left supine in bed with bed alarm set, call bell in reach and nursing present.    Therapy Documentation Precautions:  Precautions Precautions: Fall Restrictions Weight Bearing Restrictions: No General:   Vital Signs: Therapy Vitals Temp: (!) 97.5 F (36.4 C) Temp Source: Oral Pulse Rate: 65 BP: (!) 162/78 Patient Position (if appropriate): Lying Pain: Pain Assessment Pain Scale: 0-10 Pain Score:  0-No pain   Other Treatments:     Therapy/Group: Individual Therapy  Carlos Levering 09/26/2018, 3:23 PM

## 2018-09-26 NOTE — Progress Notes (Addendum)
Occupational Therapy Session Note  Patient Details  Name: Maureen Stout MRN: [id number] Date of Birth: 08-14-1931  Today's Date: 09/26/2018 OT Individual Time: 1415-1440 OT Individual Time Calculation (min):25 min    Short Term Goals: Week 1:  OT Short Term Goal 1 (Week 1): Pt will complete bathing at sit > stand level with min assist OT Short Term Goal 2 (Week 1): Pt will complete toilet transfers with min assist  OT Short Term Goal 3 (Week 1): Pt will complete LB dressing with min assist OT Short Term Goal 4 (Week 1): Pt will maintain sitting balance at EOB 5 mins with supervision to engage in self-care tasks  Skilled Therapeutic Interventions/Progress Updates:    Pt greeted asleep in recliner, easy to wake, and agreeable to OT treatment session. Pt reported need to go to the bathroom. OT placed BSC beside recliner and pt completed stand-pivot with mod A. Assistance to remove pants, then came to sitting on commode. Pt voided bowel and loose BM. Total A for peri-care and new brief donned. Sit<>stand with RW and min A. Pt needed verbal cues to maintain standing while OT fastened new brief, Pt then was able to pivot over to the bed with min A overall and verbal cues for technique to manipulate RW. Pt sat EOB and practiced 3 more sit<>stands with RW, verbal cues for hand placement, and min A. Pt then reported max fatigue and returned to bed with min A to lift LEs. OT provided pt with foam cube and practiced grasp/release for edema management. Pt falling asleep during exercises. Pt missed 20 mins of skilled OT treatment session. Pt left semi-reclined in bed with needs met.   Therapy Documentation Precautions:  Precautions Precautions: Fall Restrictions Weight Bearing Restrictions: No Pain: Pain Assessment Pain Scale: 0-10 Pain Score: 0-No pain   Therapy/Group: Individual Therapy  Valma Cava 09/26/2018, 2:56 PM

## 2018-09-26 NOTE — Progress Notes (Signed)
Physical Therapy Session Note  Patient Details  Name: Maureen Stout MRN: [id number] Date of Birth: 1931/10/27  Today's Date: 09/26/2018 PT Individual Time: 0677-0340 PT Individual Time Calculation (min): 40 min   Short Term Goals: Week 1:  PT Short Term Goal 1 (Week 1): pt will perform functional transfers with min A PT Short Term Goal 2 (Week 1): pt will perform gait x 15' in controlled environment with min A  Skilled Therapeutic Interventions/Progress Updates:    pt rec'd in bed on bed pan.  Pt able to roll both directions with min A and use of bedrails.  Pt total A for hygiene and donning new brief.  Supine to sit with min A.  Sitting balance with supervision.  Stand pivot transfer with min/mod A to recliner.  Sit <> stand x 3 with mod/max A due to posterior lean.  Pt then participates in peg board activity with simple pattern with 100% accuracy and increased time.  More complex pattern with cues for attention to task due to fatigue, 50% accuracy.  Pt left in recliner with alarm set, needs at hand.  Therapy Documentation Precautions:  Precautions Precautions: Fall Restrictions Weight Bearing Restrictions: No Pain: Pain Assessment Pain Scale: 0-10 Pain Score: 0-No pain    Therapy/Group: Individual Therapy  DONAWERTH,KAREN 09/26/2018, 1:28 PM

## 2018-09-26 NOTE — Progress Notes (Signed)
   Subjective: Patient was seen and evaluated at bedside on morning rounds. No acute events overnight. She is answering our questions with full sentences today, says that she is much better today. She can name her son and her daughters in law name. She reports swelling and some mild pain at right hand. No other complaint today.   objective:  Vital signs in last 24 hours: Vitals:   09/25/18 0332 09/25/18 1419 09/25/18 2046 09/26/18 0617  BP: 137/70 (!) 160/71 (!) 151/75 (!) 158/81  Pulse: 81 68 73 69  Resp: 15 (!) 22 12 20   Temp: 97.8 F (36.6 C) (!) 97.5 F (36.4 C) 99 F (37.2 C) (!) 97.5 F (36.4 C)  TempSrc: Axillary Oral  Oral  SpO2: 94% 90% 92% 90%  Weight: 82.5 kg     Height:       General:Ms. Heuer looks much better today. She is in no acute distress. NG tube is in place. CV: RRR, normal S1-S2, no murmur Pulmonary exam: Normal work of breathing, no cracklesno wheezing Abdomen:Soft and nontender to palpation, BS are present Extremities: Has2+ nonpitting edema in bilateral lower extremities. (Improved today)  Extremities: Non pitting swelling of rt hand. No erythema. No tenderness. pulses are present bilaterally. Bilateral lower extremity edema are improved. Neurology exam: Patient is alert and oriented today. She movesall 4extremities. She follows the commands  Assessment/Plan:  Active Problems:   Debility   Acute pancreatitis   Anasarca   Anemia of chronic disease   AKI (acute kidney injury) ([facility])   Stage 3 chronic kidney disease (HCC)   Urinary retention   Encephalopathy   Failure to thrive (child)   Recurrent UTI   CKD (chronic kidney disease), stage III (HCC)   Orthostasis   Hypokalemia   Feeding by G-tube [facility])  85 year old woman initially admitted to our service with sepsis due to a Enterobacter UTI with resulting bacteremia and accompanied by acute encephalopathy.  Encephalopathy: Significantly improved today, she is now alert and oriented x  2. No more hypothermia. Labs are stable. Was likely dilirium in setting of acute illness, malnutrition. She is getting nutrition through NG tube   -Continuing amoxicillin. (Hx of recurrent UTI, colonovesical fistula) -Continuing levothyroxine.  SevereMalnutrition: Has NG tube currently -Continuing nutritions  Right hand swelling:  No tenderness. No erythema. Pulses are present. No evidence of DVT at this time. Will monitor closely.  -Recommending limb elevation as possible and examine restrain band frequently and slightly loosen that if needed to avoid pressure.  Pancreatitis:resolved  P. AFib:currently in sinus rhythm -Recommending continue eliquis, amiodarone  RXV:QMGQQ function stable, Cr 1.5  -MonitoringBMP -Recommending gentle IV fluids if needed   HFpEF: volume status stable. Anasarca and non pitting edema seems to mostly be be in setting of low albumin and is improving as she started to get nutrition through NG tube.  Anasarca:in the setting of malnutrition and CKD,and severlylow albumin. Improving. -Is receiving nutrition through NG tube  Urinary Retention: continues to be an issue, possibly related to her current mental status as well  -Continue bladder scans and in and out cath -Trying to avoid foley, since she hashigh risk for infection with indwelling catheterization  Thank you for the consult, we will continue follow up.   Dewayne Hatch, MD 09/26/2018, 6:47 AM Pager: [phone]

## 2018-09-26 NOTE — Progress Notes (Signed)
[facility] PHYSICAL MEDICINE & REHABILITATION PROGRESS NOTE  Subjective/Complaints: Patient seen sitting up in bed this morning.  She states she slept well overnight.  She states she had a good weekend.  No reported issues overnight.  ROS: Denies CP, shortness of breath, nausea, vomiting, diarrhea.   Objective: Vital Signs: Blood pressure (!) 158/81, pulse 69, temperature (!) 97.5 F (36.4 C), temperature source Oral, resp. rate 20, height 5\' 6"  (1.676 m), weight 84.4 kg, SpO2 90 %. Dg Abd 1 View  Result Date: 09/24/2018 CLINICAL DATA:  85 year old female with gastric tube placement EXAM: ABDOMEN - 1 VIEW COMPARISON:  09/22/2018 FINDINGS: Limited plain film abdomen demonstrates gastric tube terminating within the stomach in left upper quadrant. No distended stomach small bowel or colon. Aortic calcifications. Enteric tube has been removed. IMPRESSION: Gastric tube terminates within the stomach. Electronically Signed   By: Corrie Mckusick D.O.   On: 09/24/2018 13:12   Recent Labs    09/26/18 0420  WBC 4.7  HGB 8.0*  HCT 25.7*  PLT 171   Recent Labs    09/25/18 0356 09/26/18 0420  NA 141 140  K 3.8 4.5  CL 109 108  CO2 23 24  GLUCOSE 109* 103*  BUN 22 30*  CREATININE 1.48* 1.46*  CALCIUM 8.1* 8.2*    Physical Exam: BP (!) 158/81 (BP Location: Left Arm)   Pulse 69   Temp (!) 97.5 F (36.4 C) (Oral)   Resp 20   Ht 5\' 6"  (1.676 m)   Wt 84.4 kg   SpO2 90%   BMI 30.03 kg/m  Constitutional: No distress . Vital signs reviewed. HENT: Normocephalic.  Atraumatic. Eyes: EOMI. No discharge. Cardiovascular: No JVD. Respiratory: Normal effort. GI: Non-distended. Musc: Generalized edema Neurological: Alert and oriented x3 Motor: Grossly 4-/5 throughout, unchanged Skin: Frail skin. Psychiatric: Flat  Assessment/Plan: 1. Functional deficits secondary to debility and encephalopathy which require 3+ hours per day of interdisciplinary therapy in a comprehensive inpatient rehab  setting.  Physiatrist is providing close team supervision and 24 hour management of active medical problems listed below.  Physiatrist and rehab team continue to assess barriers to discharge/monitor patient progress toward functional and medical goals  Care Tool:  Bathing  Bathing activity did not occur: Refused Body parts bathed by patient: Face         Bathing assist Assist Level: Supervision/Verbal cueing     Upper Body Dressing/Undressing Upper body dressing Upper body dressing/undressing activity did not occur (including orthotics): Refused What is the patient wearing?: Hospital gown only    Upper body assist Assist Level: Maximal Assistance - Patient 25 - 49%    Lower Body Dressing/Undressing Lower body dressing    Lower body dressing activity did not occur: Refused What is the patient wearing?: Pants     Lower body assist Assist for lower body dressing: Total Assistance - Patient < 25%     Toileting Toileting Toileting Activity did not occur Landscape architect and hygiene only): Safety/medical concerns  Toileting assist Assist for toileting: Total Assistance - Patient < 25%     Transfers Chair/bed transfer  Transfers assist     Chair/bed transfer assist level: Maximal Assistance - Patient 25 - 49%     Locomotion Ambulation   Ambulation assist      Assist level: Moderate Assistance - Patient 50 - 74% Assistive device: Walker-rolling Max distance: 5(8' in parallel bars)   Walk 10 feet activity   Assist  Walk 10 feet activity did not occur: Safety/medical  concerns        Walk 50 feet activity   Assist Walk 50 feet with 2 turns activity did not occur: Safety/medical concerns         Walk 150 feet activity   Assist Walk 150 feet activity did not occur: Safety/medical concerns         Walk 10 feet on uneven surface  activity   Assist Walk 10 feet on uneven surfaces activity did not occur: Safety/medical concerns          Wheelchair     Assist Will patient use wheelchair at discharge?: No             Wheelchair 50 feet with 2 turns activity    Assist            Wheelchair 150 feet activity     Assist            Medical Problem List and Plan: 1.Functional deficits and weaknesssecondary to debility and encephalopathy after urosepsis/pancreatitis and multiple medical issues  Cont CIR, made 15/7  Will consider palliative care consult this week pending response to tube feeds  Thyroid function within normal limits  Weekend notes reviewed-stable  Discussed with weekend MD regarding updating son.   DJD abdomen reviewed- limited breath unremarkable for thoracic infectious process, scoliosis 2. Antithrombotics: -PAF/DVT/anticoagulation:Pharmaceutical:Other (comment)- Eliquis 3. Pain Management:Tylenol prn. 4. Mood:LCSW to follow for evaluation and support. -antipsychotic agents: N/A 5. Neuropsych: This patientisnot fully capable of making decisions on herown behalf. 6. Skin/Wound Care:Routine pressure relief measures. 7. Fluids/Electrolytes/Nutrition:Strict I/O.   Extremely poor PO intake, related to cognition +/- nausea - will not schedule antiemetics secondary to AMS.     Cont NGT   IVF d/ced 8. Enterococcus bacteremia/UTI: Completed 14-day course of Maxipime   Amoxicillin 500 mg nightly started on 5/7 9. Anasarca/FLuid overload: Added heart healthy restrictions. Weight daily. Lasix daily prn depending stability of weights/symptoms.  Filed Weights   09/24/18 0413 09/25/18 0332 09/26/18 0617  Weight: 81.2 kg 82.5 kg 84.4 kg   ?  Reliability on 5/11 10. PAF: Monitor HR bid--back on amiodarone and Eliquis. Off metoprolol due to bradycardia. 11. Urinary retention with recurrent UTIs:   Bladder prolapse  Amoxicillin 500 mg nightly started on 5/7 per IM  Repeat urine culture pending  Cath for volumes > 350 cc. 12. Acute on Chronic renal failure:  SCr- 1.32 on 04/2018. Continue to monitor with routine checks.   Creatinine at 1.46 on 5/11 13. Delirium: Resolving.  14. Anemia of chronic disease: Baseline Hg around 9.0? Likely dropafterhydration.   Monitor for signs of bleeding.   Hemoglobin 8.0 on 5/11  Continue to monitor 15. Acute pancreatitis: Offer supplements with meals.   LFTs elevated on 5/6  Amylase/lipase within normal limits  Ammonia WNL  Continue to monitor 16. Hypothyroidism: Now synthroid 75 mcg/day. 17.  Orthostasis  Hypertension, IVF DC'd 18.  Hypokalemia  Potassium 4.5 on 5/11  Supplement increased on 5/7  IV supplement ordered on 5/8 19. Hypotheramia  Did not require bear hugger last night, cont to monitor 20.  Nocturnal hypoxia: Oxygen via nasal cannula as needed to keep saturation greater than 90%  LOS: 7 days A FACE TO FACE EVALUATION WAS PERFORMED  Ankit [name] 09/26/2018, 8:52 AM

## 2018-09-26 NOTE — Progress Notes (Signed)
Nutrition Follow-up  DOCUMENTATION CODES:   Severe malnutrition in context of acute illness/injury  INTERVENTION:   Continue bolus TF regimen: - Osmolite 1.5 cal formula 1 can QID (total of 948 ml daily) - Pro-stat 30 ml BID - Free water per MD, currently 100 ml q 8 hours  Tube feeding regimen provides1622kcal,89grams of protein, and 1070m of H2O (100% of needs).  NUTRITION DIAGNOSIS:   Severe Malnutrition related to acute illness (sepsis) as evidenced by energy intake < or equal to 50% for > or equal to 5 days, percent weight loss (5.7% weight loss in less than 1 week).  Progressing, being addressed via TF  GOAL:   Patient will meet greater than or equal to 90% of their needs  Met via bolus TF regimen  MONITOR:   PO intake, Labs, Weight trends, I & O's, Skin, TF tolerance  REASON FOR ASSESSMENT:   Consult Enteral/tube feeding initiation and management  ASSESSMENT:   85year old female with PMH of T2DM, PAF, CKD, enterovesicular fistula, sinus bradycardiawho was admitted on 09/06/18 with 1 week history of dizziness and weakness. Pt was found to have marked bradycardia,acute on chronic renal failure as well as elevated TSH. Pt developed hypotension and abdominal pain due to urosepsis and was started on IV abx for treatment. CT abdomen revealed acute pancreatitis and treated with NPO and D5NS. Pt has had issues with confusion and agitation due to delirium. Mentation improved briefly but she developed lethargy with hypothermia felt to be due to sepsis. Follow-up CT abdomen 4/27 showed no change in mild acute pancreatitis with increase in diffuse body wall edema with small to moderate bilateral pleural effusions. Pt continues to have intermittent hypothermia and mentation slowly improving. Pt admitted to CIR on 5/04.  5/07 - post-pyloric Cortrak tube placed under fluoro, continuous TF started, pt later pulled tube 5/08 - gastric Cortrak tube placed by Cortrak team and  bridled but pt later pulled, gastric Cortrak tube replaced by Cortrak team, bolus TF to start 5/09 - pt pulled Cortrak, NGT placed by nursing  Spoke with RN who reports pt is tolerating TF well. No issues administering boluses and no signs of intolerance. RN reports pt had a graham cracker this AM with SLP and had water but had some choking with water.  Spoke with pt at bedside. Pt reports not having any N/V, bloating, or abdominal pain. Abdomen soft upon exam. Pt states she is doing well. Pt denies pain or discomfort with NGT. Pt states, "I didn't even know I was messing with it."  Meal Completion: 0-10% x last 3 days  Medications reviewed and include: Oscal with D, Miralax, KCl 30 mEq daily  Labs reviewed: BUN 27 (H), creatinine 1.53 (H), hemoglobin 9.0 (L)  UOP: 800 ml x 24 hours I/O's: -1.1 L since admit  NUTRITION - FOCUSED PHYSICAL EXAM:    Most Recent Value  Orbital Region  Moderate depletion  Upper Arm Region  No depletion  Thoracic and Lumbar Region  No depletion  Buccal Region  No depletion  Temple Region  Mild depletion  Clavicle Bone Region  Mild depletion  Clavicle and Acromion Bone Region  Moderate depletion  Scapular Bone Region  Unable to assess  Dorsal Hand  No depletion  Patellar Region  No depletion  Anterior Thigh Region  Mild depletion  Posterior Calf Region  No depletion  Edema (RD Assessment)  Moderate [BUE, BLE]  Hair  Reviewed  Eyes  Reviewed  Mouth  Reviewed  Skin  Reviewed  Nails  Reviewed       Diet Order:   Diet Order            DIET SOFT Room service appropriate? No; Fluid consistency: Thin  Diet effective now              EDUCATION NEEDS:   Not appropriate for education at this time  Skin:  Skin Assessment: Skin Integrity Issues: Stage II: buttocks Incisions: right foot, left foot (both present on admission) Other: MASD to buttocks, perineum, and vagina; non-pressure wound to right arm  Last BM:  09/26/18 large type  6  Height:   Ht Readings from Last 1 Encounters:  09/19/18 5' 6" (1.676 m)    Weight:   Wt Readings from Last 1 Encounters:  09/26/18 84.4 kg    Ideal Body Weight:  59.1 kg  BMI:  Body mass index is 30.03 kg/m.  Estimated Nutritional Needs:   Kcal:  1550-1750  Protein:  80-95 grams  Fluid:  >/= 1.6 L    Kate Jablonski Holmes, MS, RD, LDN Inpatient Clinical Dietitian Pager: [phone] Weekend/After Hours: [phone]  

## 2018-09-27 ENCOUNTER — Inpatient Hospital Stay (HOSPITAL_COMMUNITY): Payer: Medicare PPO | Admitting: Speech Pathology

## 2018-09-27 ENCOUNTER — Inpatient Hospital Stay (HOSPITAL_COMMUNITY): Payer: Medicare PPO | Admitting: Occupational Therapy

## 2018-09-27 ENCOUNTER — Inpatient Hospital Stay (HOSPITAL_COMMUNITY): Payer: Medicare PPO | Admitting: Physical Therapy

## 2018-09-27 DIAGNOSIS — E1122 Type 2 diabetes mellitus with diabetic chronic kidney disease: Secondary | ICD-10-CM

## 2018-09-27 DIAGNOSIS — I130 Hypertensive heart and chronic kidney disease with heart failure and stage 1 through stage 4 chronic kidney disease, or unspecified chronic kidney disease: Secondary | ICD-10-CM

## 2018-09-27 DIAGNOSIS — Z79899 Other long term (current) drug therapy: Secondary | ICD-10-CM

## 2018-09-27 DIAGNOSIS — Z87440 Personal history of urinary (tract) infections: Secondary | ICD-10-CM

## 2018-09-27 LAB — BASIC METABOLIC PANEL
Anion gap: 12 (ref 5–15)
BUN: 30 mg/dL — ABNORMAL HIGH (ref 8–23)
CO2: 23 mmol/L (ref 22–32)
Calcium: 8.9 mg/dL (ref 8.9–10.3)
Chloride: 103 mmol/L (ref 98–111)
Creatinine, Ser: 1.47 mg/dL — ABNORMAL HIGH (ref 0.44–1.00)
GFR calc Af Amer: 38 mL/min — ABNORMAL LOW (ref >60)
GFR calc non Af Amer: 32 mL/min — ABNORMAL LOW (ref >60)
Glucose, Bld: 112 mg/dL — ABNORMAL HIGH (ref 70–99)
Potassium: 4.7 mmol/L (ref 3.5–5.1)
Sodium: 138 mmol/L (ref 135–145)

## 2018-09-27 LAB — T4: T4, Total: 11.8 ug/dL (ref 4.5–12.0)

## 2018-09-27 MED ORDER — FLUCONAZOLE 150 MG PO TABS
150.0000 mg | ORAL_TABLET | Freq: Once | ORAL | Status: AC
  Administered 2018-09-27: 150 mg via ORAL
  Filled 2018-09-27: qty 1

## 2018-09-27 MED ORDER — POTASSIUM CHLORIDE 20 MEQ/15ML (10%) PO SOLN
20.0000 meq | Freq: Every day | ORAL | Status: DC
  Administered 2018-09-28: 20 meq via ORAL
  Filled 2018-09-27 (×3): qty 15

## 2018-09-27 NOTE — Progress Notes (Signed)
[facility] PHYSICAL MEDICINE & REHABILITATION PROGRESS NOTE  Subjective/Complaints: Patient seen sitting up in bed this morning.  She states she did not sleep well overnight.  Confirmed with nursing.  She is much more alert today.  Patient states that the smell of food upon opening her tray causes her to have nausea.  Discussed with nursing removing tray covers.  Emphasized to patient importance of p.o. intake in order to DC NG.  She was seen by IM yesterday, no new recs.  ROS: Denies CP, shortness of breath, nausea, vomiting, diarrhea.   Objective: Vital Signs: Blood pressure (!) 176/83, pulse 72, temperature 98.1 F (36.7 C), resp. rate 20, height 5\' 6"  (1.676 m), weight 87.6 kg, SpO2 92 %. No results found. Recent Labs    09/26/18 0420 09/26/18 1040  WBC 4.7 5.0  HGB 8.0* 9.0*  HCT 25.7* 28.7*  PLT 171 185   Recent Labs    09/26/18 1040 09/27/18 0416  NA 139 138  K 4.6 4.7  CL 104 103  CO2 24 23  GLUCOSE 120* 112*  BUN 27* 30*  CREATININE 1.53* 1.47*  CALCIUM 8.7* 8.9    Physical Exam: BP (!) 176/83 (BP Location: Left Arm)   Pulse 72   Temp 98.1 F (36.7 C)   Resp 20   Ht 5\' 6"  (1.676 m)   Wt 87.6 kg   SpO2 92%   BMI 31.17 kg/m  Constitutional: No distress . Vital signs reviewed. HENT: Normocephalic.  Atraumatic. Eyes: EOMI. No discharge. Cardiovascular: No JVD. Respiratory: Normal effort. GI: Non-distended. Musc: Generalized edema Neurological: Alert and oriented x3 Motor: Grossly 4-/5 throughout, stable Skin: Frail skin. Psychiatric: Flat  Assessment/Plan: 1. Functional deficits secondary to debility and encephalopathy which require 3+ hours per day of interdisciplinary therapy in a comprehensive inpatient rehab setting.  Physiatrist is providing close team supervision and 24 hour management of active medical problems listed below.  Physiatrist and rehab team continue to assess barriers to discharge/monitor patient progress toward functional and  medical goals  Care Tool:  Bathing  Bathing activity did not occur: Refused Body parts bathed by patient: Face         Bathing assist Assist Level: Maximal Assistance - Patient 24 - 49%     Upper Body Dressing/Undressing Upper body dressing Upper body dressing/undressing activity did not occur (including orthotics): Refused What is the patient wearing?: Hospital gown only    Upper body assist Assist Level: Maximal Assistance - Patient 25 - 49%    Lower Body Dressing/Undressing Lower body dressing    Lower body dressing activity did not occur: Refused What is the patient wearing?: Pants     Lower body assist Assist for lower body dressing: Total Assistance - Patient < 25%     Toileting Toileting Toileting Activity did not occur Landscape architect and hygiene only): Safety/medical concerns  Toileting assist Assist for toileting: Total Assistance - Patient < 25%     Transfers Chair/bed transfer  Transfers assist     Chair/bed transfer assist level: Maximal Assistance - Patient 25 - 49%     Locomotion Ambulation   Ambulation assist      Assist level: Moderate Assistance - Patient 50 - 74% Assistive device: Walker-rolling Max distance: 5(8' in parallel bars)   Walk 10 feet activity   Assist  Walk 10 feet activity did not occur: Safety/medical concerns        Walk 50 feet activity   Assist Walk 50 feet with 2 turns activity did  not occur: Safety/medical concerns         Walk 150 feet activity   Assist Walk 150 feet activity did not occur: Safety/medical concerns         Walk 10 feet on uneven surface  activity   Assist Walk 10 feet on uneven surfaces activity did not occur: Safety/medical concerns         Wheelchair     Assist Will patient use wheelchair at discharge?: No             Wheelchair 50 feet with 2 turns activity    Assist            Wheelchair 150 feet activity     Assist             Medical Problem List and Plan: 1.Functional deficits and weaknesssecondary to debility and encephalopathy after urosepsis/pancreatitis and multiple medical issues  Cont CIR, 15/7  Thyroid function within normal limits 2. Antithrombotics: -PAF/DVT/anticoagulation:Pharmaceutical:Other (comment)- Eliquis 3. Pain Management:Tylenol prn. 4. Mood:LCSW to follow for evaluation and support. -antipsychotic agents: N/A 5. Neuropsych: This patientisnot fully capable of making decisions on herown behalf. 6. Skin/Wound Care:Routine pressure relief measures. 7. Fluids/Electrolytes/Nutrition:Strict I/O.   Extremely poor PO intake, appears to be related to smell of food.     Cont NGT   IVF d/ced 8. Enterococcus bacteremia/UTI: Completed 14-day course of Maxipime   Amoxicillin 500 mg nightly started on 5/7 9. Anasarca/FLuid overload: Added heart healthy restrictions. Weight daily. Lasix daily prn depending stability of weights/symptoms.  Filed Weights   09/25/18 0332 09/26/18 0617 09/27/18 0346  Weight: 82.5 kg 84.4 kg 87.6 kg   ?  Reliability on 5/12 10. PAF: Monitor HR bid--back on amiodarone and Eliquis. Off metoprolol due to bradycardia. 11. Urinary retention with recurrent UTIs:   Bladder prolapse  Amoxicillin 500 mg nightly started on 5/7 per IM  Repeat urine culture with yeast-Diflucan x1 ordered  Cath for volumes > 350 cc. 12. Acute on Chronic renal failure: SCr- 1.32 on 04/2018. Continue to monitor with routine checks.   Creatinine at 1.47 on 5/12 13. Delirium: Resolving.  14. Anemia of chronic disease: Baseline Hg around 9.0? Likely dropafterhydration.   Monitor for signs of bleeding.   Hemoglobin 9.0 on 5/11  Continue to monitor 15. Acute pancreatitis: Offer supplements with meals.   LFTs elevated on 5/6  Amylase/lipase within normal limits  Ammonia WNL  Continue to monitor 16. Hypothyroidism: Now synthroid 75 mcg/day. 17.   Orthostasis  Blood pressures?  Trending up, to do medication adjustments tomorrow if persistent 18.  Hypokalemia  Potassium 4.7 on 5/12  Supplement increased on 5/7, decreased on 5/12  IV supplement ordered on 5/8 19. Hypotheramia  Did not require bear hugger last night, cont to monitor 20.  Nocturnal hypoxia: Oxygen via nasal cannula as needed to keep saturation greater than 90% as needed  LOS: 8 days A FACE TO FACE EVALUATION WAS PERFORMED  Ankit [name] 09/27/2018, 8:38 AM

## 2018-09-27 NOTE — Progress Notes (Signed)
Speech Language Pathology Weekly Progress and Session Note  Patient Details  Name: Maureen Stout MRN: [id number] Date of Birth: Jan 30, 1932  Beginning of progress report period: Sep 20, 2018 End of progress report period: Sep 27, 2018  Today's Date: 09/27/2018 SLP Individual Time: 2119-4174 SLP Individual Time Calculation (min): 45 min  Short Term Goals: Week 1: SLP Short Term Goal 1 (Week 1): Patient will demonstrate sustained attention to functional tasks for 30 minutes with Min A verbal cues for redirection SLP Short Term Goal 1 - Progress (Week 1): Not met SLP Short Term Goal 2 (Week 1): Patient will demonstrate functional problem solving for basic and familiar tasks with Mod A verbal cues.  SLP Short Term Goal 2 - Progress (Week 1): Met SLP Short Term Goal 3 (Week 1): Patient will utilize external aids to recall new, daily information with Max A multimodal cues.  SLP Short Term Goal 3 - Progress (Week 1): Met SLP Short Term Goal 4 (Week 1): Patient wil follow multi-step commands with Mod A verbal and visual cues.  SLP Short Term Goal 4 - Progress (Week 1): Not met SLP Short Term Goal 5 (Week 1): Patient will verbalize basic information/wants and needs at the phrase level with Mod A verbal cues for word-finding.  SLP Short Term Goal 5 - Progress (Week 1): Met    New Short Term Goals: Week 2: SLP Short Term Goal 1 (Week 2): Patient will demonstrate sustained attention to functional tasks for 30 minutes with Min A verbal cues for redirection SLP Short Term Goal 2 (Week 2): Patient will demonstrate functional problem solving for basic and familiar tasks with Min A verbal cues.  SLP Short Term Goal 3 (Week 2): Patient will utilize external aids to recall new, daily information with Mod A multimodal cues.  SLP Short Term Goal 4 (Week 2): Patient wil follow multi-step commands with Mod A verbal and visual cues.   Weekly Progress Updates: Pt has made slow progress toward goals for improved  cognition, meeting 3/5 goals this week. Pt did not meet goals for sustained attention, or following multi-step commands consistently. Goals were continued or updated accordingly. Pt has exhibited poor po intake and frequent regurgitation at mealtime this week. This raises concern for esophageal dysmotility. Consideration of a regular barium swallow is recommended to evaluate esophageal dysmotility.  Intensity: Minumum of 1-2 x/day, 30 to 90 minutes Frequency: 3 to 5 out of 7 days Duration/Length of Stay: 10/03/2018 Treatment/Interventions: Cognitive remediation/compensation;Environmental controls;Internal/external aids;Speech/Language facilitation;Therapeutic Activities;Patient/family education;Functional tasks;Cueing hierarchy   Daily Session Skilled Therapeutic Interventions:   Pt was observed during lunch today. She took sips of water via straw, and exhibited no obvious oral issues and no overt s/s aspiration. Soft solid and puree consistencies were also accepted, however, after the swallow pt was noted to spit the material back out after she had swallowed it. This raises concern for esophageal dysmotility. Currently pt has NGT in place, which may be exacerbating the issue. Recommend consideration of completing a regular barium swallow to evaluate esophageal motility. SLP provided diagnostic treatment via administration of the [place] (SLUMS) Examination. Pt scored 14/30. Points were lost on immediate and delayed recall, functional mental math, thought organization, clock drawing, and auditory retention. Pt was oriented to person, place, and time, but was unable to state reason for hospitalization.   General    Pain None reported  Therapy/Group: Individual Therapy    B. Quentin Ore, [place], [place] Speech Language Pathologist  Colon Flattery  Brown 09/27/2018, 3:04 PM

## 2018-09-27 NOTE — Telephone Encounter (Signed)
error 

## 2018-09-27 NOTE — Progress Notes (Signed)
Physical Therapy Weekly Progress Note  Patient Details  Name: Maureen Stout MRN: [id number] Date of Birth: 13-Aug-1931  Beginning of progress report period: Sep 20, 2018 End of progress report period: Sep 27, 2018  Today's Date: 09/27/2018 PT Individual Time: 7494-4967 PT Individual Time Calculation (min): 23 min   Pt rec'd in recliner, agreeable to therapy.  More alert this session. Pt performs sit <> stand with RW with min A, stepping fwd/bkwd x 8 with RW and min A.  Gait with RW and min A 10' x 2. Much improved activity tolerance, balance and mobility this session.  Pt very fatigued after gait, but pleased with progress. Pt left in recliner with needs at hand, alarm set.  Patient has met 0 of 2 short term goals.  Pt limited by impaired cognition, decreased activity tolerance, decreased arousal. Pt currently min/mod A with transfers, min A gait 10', mod A bed mobility.  Patient continues to demonstrate the following deficits muscle weakness, decreased cardiorespiratoy endurance, decreased attention, decreased awareness, decreased problem solving, decreased safety awareness, decreased memory and delayed processing and decreased standing balance, decreased postural control and decreased balance strategies and therefore will continue to benefit from skilled PT intervention to increase functional independence with mobility.  Patient progressing toward long term goals..  Continue plan of care.  PT Short Term Goals Week 1:  PT Short Term Goal 1 (Week 1): pt will perform functional transfers with min A PT Short Term Goal 1 - Progress (Week 1): Progressing toward goal PT Short Term Goal 2 (Week 1): pt will perform gait x 15' in controlled environment with min A PT Short Term Goal 2 - Progress (Week 1): Progressing toward goal Week 2:  PT Short Term Goal 1 (Week 2): pt will consistently perform transfers at min A PT Short Term Goal 2 (Week 2): pt will perform gait wtih min A x 15' in controlled  environment  Skilled Therapeutic Interventions/Progress Updates:  Ambulation/gait training;Community reintegration;DME/adaptive equipment instruction;Neuromuscular re-education;Stair training;UE/LE Strength taining/ROM;Wheelchair propulsion/positioning;UE/LE Coordination activities;Therapeutic Activities;Pain management;Discharge planning;Balance/vestibular training;Cognitive remediation/compensation;Functional mobility training;Splinting/orthotics;Patient/family education;Therapeutic Exercise   Therapy Documentation Precautions:  Precautions Precautions: Fall Restrictions Weight Bearing Restrictions: No Pain:  no c/o pain  Therapy/Group: Individual Therapy  Kaisen Ackers 09/27/2018, 11:20 AM

## 2018-09-27 NOTE — Progress Notes (Signed)
Occupational Therapy Weekly Progress Note  Patient Details  Name: Maureen Stout MRN: [id number] Date of Birth: 1931/07/14  Beginning of progress report period: Sep 20, 2018 End of progress report period: Sep 27, 2018  Today's Date: 09/27/2018 OT Individual Time: 7628-3151 OT Individual Time Calculation (min): 55 min    Patient has met 1 of 4 short term goals.  Pt is making slow progress towards goals.  Progress has been impacted due to multiple medical issues impacting arousal, orientation, initiation, and participation in familiar, functional tasks.  During last couple of days, pt more alert and oriented to self and place with increased participation in basic tasks. Pt easily fatigued with sit > stand and stand pivot transfers.  Pt currently requires mod assist for LB dressing and stand pivot transfers.  Pt has begun eating small amounts of food and is demonstrating improvements in participation and alertness.  Pt continues to be very deconditioned and will continue to require intensive OT to prepare for d/c home with family.  Patient continues to demonstrate the following deficits: muscle weakness, decreased cardiorespiratoy endurance, decreased attention, decreased awareness, decreased problem solving, decreased safety awareness, decreased memory and delayed processing and decreased sitting balance, decreased standing balance and decreased postural control and therefore will continue to benefit from skilled OT intervention to enhance overall performance with BADL and Reduce care partner burden.  Patient progressing toward long term goals..  Continue plan of care.  OT Short Term Goals Week 1:  OT Short Term Goal 1 (Week 1): Pt will complete bathing at sit > stand level with min assist OT Short Term Goal 1 - Progress (Week 1): Progressing toward goal OT Short Term Goal 2 (Week 1): Pt will complete toilet transfers with min assist  OT Short Term Goal 2 - Progress (Week 1): Progressing toward  goal OT Short Term Goal 3 (Week 1): Pt will complete LB dressing with min assist OT Short Term Goal 3 - Progress (Week 1): Progressing toward goal OT Short Term Goal 4 (Week 1): Pt will maintain sitting balance at EOB 5 mins with supervision to engage in self-care tasks OT Short Term Goal 4 - Progress (Week 1): Met Week 2:  OT Short Term Goal 1 (Week 2): Pt will complete bathing at sit > stand level with min assist OT Short Term Goal 2 (Week 2): Pt will complete toilet transfers with min assist  OT Short Term Goal 3 (Week 2): Pt will complete LB dressing with min assist  Skilled Therapeutic Interventions/Progress Updates:    Treatment session with focus on ADL retraining and activity tolerance.  Pt received upright in bed with RN present and pt eating a few bites of breakfast.  Pt appears more alert and oriented to self and place this session.  Pt agreeable to bathing and dressing tasks.  Completed bed mobility with mod assist and maintained sitting balance >5 mins with supervision on EOB.  Completed sit > stand for LB hygiene with min-mod assist for sit > stand and therapist completing perineal hygiene.  Stand pivot transfer mod assist to recliner with use of RW to facilitate more upright standing posture.  Completed UB bathing and LB dressing from recliner with increased initiation and sequencing during bathing, still requiring min-mod cues for sequencing and problem solving.  Pt with improved mentation and engagement in session.  Pt left semi-reclined in recliner with seat belt alarm on and RUE propped on pillow for edema management and all needs in reach.  Wrist restraints left  off per discussion with RN.  Therapy Documentation Precautions:  Precautions Precautions: Fall Restrictions Weight Bearing Restrictions: No Pain:  Pt with no c/o pain   Therapy/Group: Individual Therapy  [place] Natarajan, [place] 09/27/2018, 10:00 AM

## 2018-09-27 NOTE — Progress Notes (Signed)
   Subjective: Patient was seen and evaluated at bedside on morning rounds. She is awake, interactive and communicates well today. Endorses that right hand swelling is better a little bit. Doe snot have any pain ar rt upper limb.   Objective:  Vital signs in last 24 hours: Vitals:   09/26/18 1500 09/26/18 1933 09/27/18 0346 09/27/18 0355  BP: (!) 162/78 (!) 156/82  (!) 176/83  Pulse: 65 68  72  Resp:  14  20  Temp: (!) 97.5 F (36.4 C) 98 F (36.7 C)  98.1 F (36.7 C)  TempSrc: Oral     SpO2:  94%  92%  Weight:   87.6 kg   Height:       General:Maureen Stout looks much better today. She is in no acute distress.NG tube is in place. CV: RRR, normal S1-S2, no murmur Pulmonary exam: Normal work of breathing, no cracklesno wheezing Abdomen:Soft and nontender to palpation, BS are present Extremities: Has2+ nonpitting edema in bilateral lower extremities.(Improved today) Extremities: 1-2+ Non pitting edema of rt hand, improved today. No erythema. No tenderness. pulses are present bilaterally. 1+ bilateral nonpitting edema (improved.) Neurology exam: Patient is alert and oriented. She follows the commands  Assessment/Plan:  Active Problems:   Debility   Acute pancreatitis   Anasarca   Anemia of chronic disease   AKI (acute kidney injury) ([place])   Stage 3 chronic kidney disease (HCC)   Urinary retention   Encephalopathy   Failure to thrive (child)   Recurrent UTI   CKD (chronic kidney disease), stage III (HCC)   Orthostasis   Hypokalemia   Feeding by G-tube ([place])   At risk for dehydration due to poor fluid intake  85 year old woman initially admitted to our service with sepsis due to a Enterobacter UTI with resulting bacteremia and accompanied by acute encephalopathy.  Encephalopathy: Was likely dilirium in setting of acute illness, malnutrition. Significantly improved, she is now alert and oriented x 2. No more hypothermia. Labs are stable. She is getting nutrition  through NG tube  Labs are unremarkable and stable.  -Continuing amoxicillin. (Hx of recurrent UTI, colonovesical fistula) -Continuing levothyroxine.  SevereMalnutrition:Fortunately NG tube maintained and she is getting nutrition. -Continuing nutritions  Right hand swelling: Still present but improved. Denies any pain.  No tenderness. No erythema. Pulses are present. No evidence of DVT  -Recommending limb elevation as possible and examine restrain band frequently and slightly loosen that if needed to avoid pressure.  Pancreatitis:resolved  P.AFib:currently in sinus rhythm -Recommending continue eliquis, amiodarone  TOI:ZTIWP function stable -MonitoringBMP -Recommending gentle IV fluids if needed   HFpEF: volume status stable. Anasarca and non pitting edema seems to mostly be be in setting of low albumin and is improving as she started to get nutrition through NG tube.  Anasarca:in the setting of malnutrition and CKD,and severlylow albumin. Improving with nutrition. -Is receiving nutrition through NG tube  Urinary Retention: continues to be an issue, possibly related to her current mental status as well  -Continue bladder scans and in and out cath -Trying to avoid foley, since she hashigh risk for infection with indwelling catheterization  Thank you for the consult, we will continue follow up.    Dewayne Hatch, MD 09/27/2018, 5:38 AM Pager: @[email or web address] @

## 2018-09-27 NOTE — Progress Notes (Signed)
Wrist restaint applied and monitor per protocol

## 2018-09-27 NOTE — Progress Notes (Signed)
  Date: 09/27/2018  Patient name: Maureen Stout  Medical record number: [id number]  Date of birth: May 02, 1932   I have seen and evaluated this patient and I have discussed the plan of care with the house staff. Please see their note for complete details. I concur with their findings with the following additions/corrections:   Seen with the team on rounds this morning in follow-up consultation.  She is much more alert and interactive today.  She had just finished bathing with OT and was able to assist with bathing her upper body and redressing afterward.  Intake remains poor, with only 5 to 10% of meals eaten yesterday.  Recommendations: 1.  Acute encephalopathy: Improving, likely delirium related to illness and malnutrition. -Continue efforts to maximize nutritional intake. -Continue to ensure adequate bowel and bladder function.  2.  Recurrent UTI with enterovesicular fistula and recent Enterobacter UTI with bacteremia: -Continue amoxicillin prophylaxis and intermittent catheterization as needed for urinary retention  3.  Severe malnutrition: -Continue supplemental NG tube feeds until she is able to resume adequate oral nutrition. -IV fluids have been stopped, volume status looks appropriate, okay to hold off on further IV fluids for now. -Reasonable to continue daily BMP with magnesium and phosphorus for 1 or 2 more days, then can likely space out as long as she continues to take in daily nutrition  4.  Paroxysmal atrial fibrillation: Heart rate at goal -Continue amiodarone and apixaban -Do not restart metoprolol  5.  Hypertension: Blood pressures have been up pretty consistently over the past 3 days.  Previously was on amlodipine and metoprolol.  Would continue holding metoprolol for symptomatic bradycardia earlier this admission bradycardia.  Amlodipine is not ideal with her significant edema.  Could consider initiation of low-dose ARB such as losartan given her diabetes and CKD. -  Consider losartan 50 mg daily if persistently hypertensive  Remainder of her medical problems appear stable.  We will sign off at this time, but please feel free to reach out with additional questions or concerns and we will be happy to see her again.  Thank you for the excellent care you are providing.  Lenice Pressman, M.D., Ph.D. 09/27/2018, 2:22 PM

## 2018-09-28 ENCOUNTER — Inpatient Hospital Stay (HOSPITAL_COMMUNITY): Payer: Medicare PPO

## 2018-09-28 ENCOUNTER — Inpatient Hospital Stay (HOSPITAL_COMMUNITY): Payer: Medicare PPO | Admitting: Speech Pathology

## 2018-09-28 DIAGNOSIS — R1310 Dysphagia, unspecified: Secondary | ICD-10-CM

## 2018-09-28 LAB — BASIC METABOLIC PANEL
Anion gap: 10 (ref 5–15)
BUN: 33 mg/dL — ABNORMAL HIGH (ref 8–23)
CO2: 26 mmol/L (ref 22–32)
Calcium: 9.1 mg/dL (ref 8.9–10.3)
Chloride: 104 mmol/L (ref 98–111)
Creatinine, Ser: 1.43 mg/dL — ABNORMAL HIGH (ref 0.44–1.00)
GFR calc Af Amer: 39 mL/min — ABNORMAL LOW (ref >60)
GFR calc non Af Amer: 34 mL/min — ABNORMAL LOW (ref >60)
Glucose, Bld: 113 mg/dL — ABNORMAL HIGH (ref 70–99)
Potassium: 5.1 mmol/L (ref 3.5–5.1)
Sodium: 140 mmol/L (ref 135–145)

## 2018-09-28 LAB — BRAIN NATRIURETIC PEPTIDE: B Natriuretic Peptide: 241.8 pg/mL — ABNORMAL HIGH (ref 0.0–100.0)

## 2018-09-28 LAB — GLUCOSE, CAPILLARY: Glucose-Capillary: 95 mg/dL (ref 70–99)

## 2018-09-28 MED ORDER — POTASSIUM CHLORIDE 20 MEQ/15ML (10%) PO SOLN
10.0000 meq | Freq: Every day | ORAL | Status: DC
  Administered 2018-09-29 – 2018-10-05 (×7): 10 meq via ORAL
  Filled 2018-09-28 (×7): qty 15

## 2018-09-28 NOTE — Progress Notes (Signed)
Speech Language Pathology Daily Session Note  Patient Details  Name: DHANA TOTTON MRN: [id number] Date of Birth: 1931-06-05  Today's Date: 09/28/2018 SLP Individual Time: 1130-1155 SLP Individual Time Calculation (min): 25 min  Short Term Goals: Week 2: SLP Short Term Goal 1 (Week 2): Patient will demonstrate sustained attention to functional tasks for 30 minutes with Min A verbal cues for redirection SLP Short Term Goal 2 (Week 2): Patient will demonstrate functional problem solving for basic and familiar tasks with Min A verbal cues.  SLP Short Term Goal 3 (Week 2): Patient will utilize external aids to recall new, daily information with Mod A multimodal cues.  SLP Short Term Goal 4 (Week 2): Patient wil follow multi-step commands with Mod A verbal and visual cues.   Skilled Therapeutic Interventions: Skilled treatment session focused on cognitive goals. Upon arrival, patient was awake and alert while upright in bed. Patient independent reported she had been incontinent of urine and followed 1 step commands with extra time during self-care tasks. Patient was independently oriented to place and time and was able to participate in a basic conversation that focused on intellectual awareness of deficits with overall Mod  A verbal cues. Patient NPO during session due to barium swallow test scheduled for this afternoon. Patient left upright in bed with alarm on and all needs within reach. Continue with current plan of care.      Pain Pain Assessment Pain Scale: 0-10 Pain Score: 0-No pain  Therapy/Group: Individual Therapy  Mellie Buccellato 09/28/2018, 12:25 PM

## 2018-09-28 NOTE — Progress Notes (Signed)
Social Work Patient ID: Maureen Stout, female   DOB: 07/26/1931, 85 y.o.   MRN: [id number] Spoke with son-[name] via telephone to inform of team conference goals supervision level and target discharge date 5/25. He is glad to hear she is doing better and making progress. Discussed having him come in for education prior to pt's discharge-he would like to come in Thursday. Will ask team time that works and get back with son.

## 2018-09-28 NOTE — Patient Care Conference (Signed)
Inpatient RehabilitationTeam Conference and Plan of Care Update Date: 09/28/2018   Time: 2:20 PM    Patient Name: Maureen Stout      Medical Record Number: [id number]  Date of Birth: 30-Nov-1931 Sex: Female         Room/Bed: 4M01C/4M01C-01 Payor Info: Payor: [employer] MEDICARE / Plan: [employer] MEDICARE CHOICE PPO / Product Type: *No Product type* /    Admitting Diagnosis: Debility, sepsis;  2004D; 13-16days  Admit Date/Time:  09/19/2018  3:08 PM Admission Comments: No comment available   Primary Diagnosis:  <principal problem not specified> Principal Problem: <principal problem not specified>  Patient Active Problem List   Diagnosis Date Noted  . Dysphagia   . At risk for dehydration due to poor fluid intake   . Feeding by G-tube ([place])   . Failure to thrive (child)   . Recurrent UTI   . CKD (chronic kidney disease), stage III ([place])   . Orthostasis   . Hypokalemia   . Encephalopathy   . Acute pancreatitis   . Anasarca   . Anemia of chronic disease   . AKI (acute kidney injury) ([place])   . Stage 3 chronic kidney disease ([place])   . Urinary retention   . Debility 09/19/2018  . Sepsis due to Enterobacter species ([place]) 09/08/2018  . Bacteremia 09/08/2018  . Hypoglycemia without diagnosis of diabetes mellitus 09/08/2018  . Acute on chronic anemia 09/07/2018  . Acute pancreatitis without infection or necrosis 09/07/2018  . Bradycardia 09/07/2018  . Sinus bradycardia 09/06/2018  . Hypothyroidism 09/06/2018  . First degree AV block 09/06/2018  . Abnormal CXR 04/13/2018  . On amiodarone therapy 01/08/2018  . Chronic anticoagulation 01/08/2018  . Hypertensive heart disease 01/08/2018  . Elevated TSH 01/08/2018  . Pressure injury of skin 04/19/2017  . Paroxysmal atrial fibrillation ([place]) 04/18/2017    Expected Discharge Date: Expected Discharge Date: 10/10/18  Team Members Present: Physician leading conference: Dr. Delice Lesch Social Worker Present: Ovidio Kin, LCSW Nurse Present:  Other (comment)(Tonia Gane-LPN) PT Present: Georjean Mode, PT OT Present: Roanna Epley, COTA SLP Present: Weston Anna, SLP PPS Coordinator present : Gunnar Fusi     Current Status/Progress Goal Weekly Team Focus  Medical   Functional deficits and weakness secondary to debility and encephalopathy after urosepsis/pancreatitis and multiple medical issues  Improve mobility, transfers, dysphagia, multiple medical issues, HTN  See above   Bowel/Bladder   Continent of Bladder/Bowel, voiding frequently,,Bladder scan Q4-6 hrs, I/O > 350, LBM 09/28/18  regain continences of bladder; maintian regular bowel pattern  QS assessment ,address toileting needs,   Swallow/Nutrition/ Hydration             ADL's   mod assist transfers, mod assist LB dressing, min-mod assist sit > stand for bathing/dressing, poor activity tolerance, impaired cognition (although improving)  supervision.  May have to downgrade, however hopeful due to improving mentition that goals can stay supervision  ADL retraining, functional tranfers, standing balance, sit > stand, activity tolerance, d/c planning, family education   Mobility   mod A transfers and bed mobility, gait limited due to fatigue, cognition, poor activity tolerance  supervision overall  activity tolerance, d/c planning   Communication   Min A  Min A  verbal expression of wants/needs    Safety/Cognition/ Behavioral Observations  Mod-Max A   Min A  attention, problem solving and recall    Pain   No c/o pain  remain pain free  QS pain and prn,with evaluation     Skin  Right arm abrasion with edema , no draiange open air, continue elevation of right arm/ pillows, Buttocks with some redness repostioned    treat current skin issues per orders; remain free of new skin breakdown/infection  QS/PRN assessment with f/u prn        *See Care Plan and progress notes for long and short-term goals.     Barriers to Discharge  Current Status/Progress Possible Resolutions  Date Resolved   Physician    Medical stability;Nutrition means     See above  Therapies, multiple medical issues, follow labs, barrium swallow, follow BP      Nursing                  PT                    OT                  SLP                SW                Discharge Planning/Teaching Needs:  Pt doing better since NG place able to participate more in therpaies. Will need to extend her stay due to lost a week from medical issues last week.      Team Discussion:  Making progress this week due to more alert and can participate more in therpaies. MBS today. NG place last Friday. Chest x-ray today. Edema worse.  Medically better and plan still for supervision level for OT & PT. Will need family education prior to DC home with son and daughter in-law.  Revisions to Treatment Plan:  DC 5/25    Continued Need for Acute Rehabilitation Level of Care: The patient requires daily medical management by a physician with specialized training in physical medicine and rehabilitation for the following conditions: Daily direction of a multidisciplinary physical rehabilitation program to ensure safe treatment while eliciting the highest outcome that is of practical value to the patient.: Yes Daily medical management of patient stability for increased activity during participation in an intensive rehabilitation regime.: Yes Daily analysis of laboratory values and/or radiology reports with any subsequent need for medication adjustment of medical intervention for : Neurological problems;Renal problems;Other;Mood/behavior problems   I attest that I was present, lead the team conference, and concur with the assessment and plan of the team. Teleconference held due to [place]   Obinna Ehresman, [place] 09/28/2018, 2:20 PM

## 2018-09-28 NOTE — Progress Notes (Addendum)
PHYSICAL MEDICINE & REHABILITATION PROGRESS NOTE  Subjective/Complaints: Patient seen sitting up in bed this morning.  She states she slept fairly overnight.  She wants to know when she can go home.  Discussed with SLP regarding barium swallow.  ROS: Denies CP, shortness of breath, nausea, vomiting, diarrhea.   Objective: Vital Signs: Blood pressure (!) 173/93, pulse 68, temperature 98.4 F (36.9 C), resp. rate 20, height 5\' 6"  (1.676 m), weight 85.9 kg, SpO2 98 %. No results found. Recent Labs    09/26/18 0420 09/26/18 1040  WBC 4.7 5.0  HGB 8.0* 9.0*  HCT 25.7* 28.7*  PLT 171 185   Recent Labs    09/27/18 0416 09/28/18 0350  NA 138 140  K 4.7 5.1  CL 103 104  CO2 23 26  GLUCOSE 112* 113*  BUN 30* 33*  CREATININE 1.47* 1.43*  CALCIUM 8.9 9.1    Physical Exam: BP (!) 173/93 (BP Location: Left Arm)   Pulse 68   Temp 98.4 F (36.9 C)   Resp 20   Ht 5\' 6"  (1.676 m)   Wt 85.9 kg   SpO2 98%   BMI 30.57 kg/m  Constitutional: No distress . Vital signs reviewed. HENT: Normocephalic.  Atraumatic. Eyes: EOMI. No discharge. Cardiovascular: No JVD. Respiratory: Normal effort. GI: Non-distended. Musc: Generalized edema,?  Increasing Neurological: Alert and oriented x3 Motor: Grossly 4-/5 throughout, unchanged Skin: Frail skin. Psychiatric: Flat  Assessment/Plan: 1. Functional deficits secondary to debility and encephalopathy which require 3+ hours per day of interdisciplinary therapy in a comprehensive inpatient rehab setting.  Physiatrist is providing close team supervision and 24 hour management of active medical problems listed below.  Physiatrist and rehab team continue to assess barriers to discharge/monitor patient progress toward functional and medical goals  Care Tool:  Bathing  Bathing activity did not occur: Refused Body parts bathed by patient: Right arm, Left arm, Chest, Abdomen, Face   Body parts bathed by helper: Front perineal area,  Buttocks     Bathing assist Assist Level: Moderate Assistance - Patient 50 - 74%     Upper Body Dressing/Undressing Upper body dressing Upper body dressing/undressing activity did not occur (including orthotics): Refused What is the patient wearing?: Hospital gown only    Upper body assist Assist Level: Moderate Assistance - Patient 50 - 74%    Lower Body Dressing/Undressing Lower body dressing    Lower body dressing activity did not occur: Refused What is the patient wearing?: Pants     Lower body assist Assist for lower body dressing: Moderate Assistance - Patient 50 - 74%     Toileting Toileting Toileting Activity did not occur Landscape architect and hygiene only): Safety/medical concerns  Toileting assist Assist for toileting: Total Assistance - Patient < 25%     Transfers Chair/bed transfer  Transfers assist     Chair/bed transfer assist level: Minimal Assistance - Patient > 75%     Locomotion Ambulation   Ambulation assist      Assist level: Minimal Assistance - Patient > 75% Assistive device: Walker-rolling Max distance: 10   Walk 10 feet activity   Assist  Walk 10 feet activity did not occur: Safety/medical concerns  Assist level: Minimal Assistance - Patient > 75% Assistive device: Walker-rolling   Walk 50 feet activity   Assist Walk 50 feet with 2 turns activity did not occur: Safety/medical concerns         Walk 150 feet activity   Assist Walk 150 feet activity did not  occur: Safety/medical concerns         Walk 10 feet on uneven surface  activity   Assist Walk 10 feet on uneven surfaces activity did not occur: Safety/medical concerns         Wheelchair     Assist Will patient use wheelchair at discharge?: No             Wheelchair 50 feet with 2 turns activity    Assist            Wheelchair 150 feet activity     Assist            Medical Problem List and Plan: 1.Functional  deficits and weaknesssecondary to debility and encephalopathy after urosepsis/pancreatitis and multiple medical issues  Cont CIR, 15/7  Thyroid function within normal limits  Team conference today to discuss current and goals and coordination of care, home and environmental barriers, and discharge planning with nursing, case manager, and therapies.  2. Antithrombotics: -PAF/DVT/anticoagulation:Pharmaceutical:Other (comment)- Eliquis 3. Pain Management:Tylenol prn. 4. Mood:LCSW to follow for evaluation and support. -antipsychotic agents: N/A 5. Neuropsych: This patientisnot fully capable of making decisions on herown behalf. 6. Skin/Wound Care:Routine pressure relief measures. 7. Fluids/Electrolytes/Nutrition:Strict I/O.   Extremely poor PO intake, with minimal improvement   Cont NGT   IVF d/ced  Barium swallow ordered after discussion with SLP. 8. Enterococcus bacteremia/UTI: Completed 14-day course of Maxipime   Amoxicillin 500 mg nightly started on 5/7 9. Anasarca/FLuid overload: Added heart healthy restrictions. Weight daily. Lasix daily prn depending stability of weights/symptoms.  Filed Weights   09/26/18 0617 09/27/18 0346 09/28/18 0543  Weight: 84.4 kg 87.6 kg 85.9 kg   Relatively controlled on 5/13 10. PAF: Monitor HR bid--back on amiodarone and Eliquis. Off metoprolol due to bradycardia. 11. Urinary retention with recurrent UTIs:   Bladder prolapse  Amoxicillin 500 mg nightly started on 5/7 per IM  Repeat urine culture with yeast-Diflucan x1 completed  Cath for volumes > 350 cc. 12. Acute on Chronic renal failure: SCr- 1.32 on 04/2018. Continue to monitor with routine checks.   Creatinine at 1.43 on 5/13 13. Delirium: Resolving.  14. Anemia of chronic disease: Baseline Hg around 9.0? Likely dropafterhydration.   Monitor for signs of bleeding.   Hemoglobin 9.0 on 5/11  Continue to monitor 15. Acute pancreatitis: Offer supplements with meals.    LFTs elevated on 5/6  Amylase/lipase within normal limits  Ammonia WNL  Continue to monitor 16. Hypothyroidism: Now synthroid 75 mcg/day. 17.  Hypertension  Trending up  Will order chest x-ray and BNP to evaluate for fluid overload.  Edema also looks to be getting worse.  Discussed with PA.  May require diuretic 18.  Hypokalemia  Potassium 5.1 on 5/13  Supplement increased on 5/7, decreased on 5/12, decreased again on 5/13  IV supplement ordered on 5/8 19. Hypotheramia  Did not require bear hugger last night, cont to monitor  LOS: 9 days A FACE TO FACE EVALUATION WAS PERFORMED   [name] 09/28/2018, 8:36 AM

## 2018-09-28 NOTE — Progress Notes (Signed)
This Patient has had a restless night and did not sleep much as all, Denies pain, respiration and O2 sats monitored.

## 2018-09-28 NOTE — Progress Notes (Signed)
Occupational Therapy Note  Patient Details  Name: Maureen Stout MRN: [id number] Date of Birth: 07/31/1931  Today's Date: 09/28/2018 OT Missed Time: 3 Minutes Missed Time Reason: Patient fatigue  Pt missed 45 mins skilled OT servies secondary to fatigue.  Pt alert but with difficulty keeping eyes open when engaged.    Leotis Shames [facility] 09/28/2018, 1:01 PM

## 2018-09-28 NOTE — Progress Notes (Addendum)
Physical Therapy Session Note  Patient Details  Name: Maureen Stout MRN: [id number] Date of Birth: October 18, 1931  Today's Date: 09/28/2018 PT Individual Time:1035-1100  -  and 1540-1500   25 and 20 minutes  Short Term Goals: Week 2:  PT Short Term Goal 1 (Week 2): pt will consistently perform transfers at min A PT Short Term Goal 2 (Week 2): pt will perform gait wtih min A x 15' in controlled environment      Skilled Therapeutic Interventions/Progress Updates:   tx 1:  Pt resting in bed, holding an emesis bag. She reported that she vomited this morning.  Tonya, LPN confirmed that pt vomited her breakfast this AM; she has just started on soft foods.  She was SOB when conversing and had difficulty with breath control  Supine with HOB at 50 degrees, 10 x 1 each alternating ankle pumps, R/L straight leg raises, bil glut sets, pelvic tilts in hook lying, active assistive lower trunk rotation and R/L shoulder protraction.  Pt left resting in bed with needs at hand and alarm set. Pillow used to elevate RUE due to significant edema.  tx 2:  Pt resting in bed.  She was very fatigued and unable to keep her eyes open and missed 10 minutes of tx. bil UEs now noted with significant edema.   Supine at 30 degrees HOB, 10 x 1 : lower trunk rotation, active assistive pelvic tilts, bil glut sets, bil adductor squeezes.  Rolling R with supervision, using bed features.  In R side lying" 10 x 1 L hip abduction with flexed knees and hips.  Pt remained in bed with alarm set and needs at hand, pillows for positioning for comfort and edema control.  Towel rolls used behind back to get pt slightly onto R side.       Therapy Documentation Precautions:  Precautions Precautions: Fall Restrictions Weight Bearing Restrictions: No Therapy Vitals Pulse Rate: 69 Patient Position (if appropriate): Lying Oxygen Therapy SpO2: 96 % O2 Device: Room Air Pain: pt denies AM and pm        Therapy/Group:  Individual Therapy  Stacie Templin 09/28/2018, 3:55 PM

## 2018-09-28 NOTE — Progress Notes (Signed)
Patient temperature was 95.7 at 1600. NT placed warm blanket and heating pad underneath the patient. Temperature returned to 97.9. NT rechecked the temp after dinner at 1730 and it was 95.7, heating pad temp was increased, pt offered warm tea. Warm blankets( out of the dryer) were placed on patient. Pt temp was 97.7 axillary at 1830. Pt also removed NG tube at 1845.  Doy Hutching, LPN

## 2018-09-29 ENCOUNTER — Inpatient Hospital Stay (HOSPITAL_COMMUNITY): Payer: Medicare PPO | Admitting: Occupational Therapy

## 2018-09-29 ENCOUNTER — Inpatient Hospital Stay (HOSPITAL_COMMUNITY): Payer: Medicare PPO

## 2018-09-29 ENCOUNTER — Inpatient Hospital Stay (HOSPITAL_COMMUNITY): Payer: Medicare PPO | Admitting: Speech Pathology

## 2018-09-29 DIAGNOSIS — R609 Edema, unspecified: Secondary | ICD-10-CM

## 2018-09-29 DIAGNOSIS — I340 Nonrheumatic mitral (valve) insufficiency: Secondary | ICD-10-CM

## 2018-09-29 DIAGNOSIS — K224 Dyskinesia of esophagus: Secondary | ICD-10-CM

## 2018-09-29 DIAGNOSIS — I361 Nonrheumatic tricuspid (valve) insufficiency: Secondary | ICD-10-CM

## 2018-09-29 DIAGNOSIS — I517 Cardiomegaly: Secondary | ICD-10-CM

## 2018-09-29 LAB — ECHOCARDIOGRAM COMPLETE
Height: 66 in
Weight: 3030 oz

## 2018-09-29 LAB — BASIC METABOLIC PANEL
Anion gap: 14 (ref 5–15)
BUN: 31 mg/dL — ABNORMAL HIGH (ref 8–23)
CO2: 22 mmol/L (ref 22–32)
Calcium: 9.5 mg/dL (ref 8.9–10.3)
Chloride: 104 mmol/L (ref 98–111)
Creatinine, Ser: 1.56 mg/dL — ABNORMAL HIGH (ref 0.44–1.00)
GFR calc Af Amer: 35 mL/min — ABNORMAL LOW (ref >60)
GFR calc non Af Amer: 30 mL/min — ABNORMAL LOW (ref >60)
Glucose, Bld: 90 mg/dL (ref 70–99)
Potassium: 4.9 mmol/L (ref 3.5–5.1)
Sodium: 140 mmol/L (ref 135–145)

## 2018-09-29 MED ORDER — ENSURE ENLIVE PO LIQD
237.0000 mL | Freq: Three times a day (TID) | ORAL | Status: DC
  Administered 2018-09-29 – 2018-10-09 (×28): 237 mL via ORAL

## 2018-09-29 MED ORDER — FUROSEMIDE 20 MG PO TABS
20.0000 mg | ORAL_TABLET | Freq: Two times a day (BID) | ORAL | Status: DC
  Administered 2018-09-29 – 2018-09-30 (×2): 20 mg via ORAL
  Filled 2018-09-29 (×2): qty 1

## 2018-09-29 MED ORDER — FUROSEMIDE 20 MG PO TABS: 20.0000 mg | ORAL_TABLET | Freq: Two times a day (BID) | ORAL | Status: DC

## 2018-09-29 MED ORDER — FUROSEMIDE 10 MG/ML IJ SOLN
40.0000 mg | Freq: Once | INTRAMUSCULAR | Status: AC
  Administered 2018-09-29: 10:00:00 40 mg via INTRAVENOUS
  Filled 2018-09-29: qty 4

## 2018-09-29 MED ORDER — PRO-STAT SUGAR FREE PO LIQD
30.0000 mL | Freq: Two times a day (BID) | ORAL | Status: DC
  Administered 2018-09-29 – 2018-10-10 (×22): 30 mL via ORAL
  Filled 2018-09-29 (×22): qty 30

## 2018-09-29 NOTE — Progress Notes (Signed)
Speech Language Pathology Daily Session Note  Patient Details  Name: Maureen Stout MRN: [id number] Date of Birth: 03-06-1932  Today's Date: 09/29/2018 SLP Individual Time: 0830-0900 SLP Individual Time Calculation (min): 30 min  Short Term Goals: Week 2: SLP Short Term Goal 1 (Week 2): Patient will demonstrate sustained attention to functional tasks for 30 minutes with Min A verbal cues for redirection SLP Short Term Goal 2 (Week 2): Patient will demonstrate functional problem solving for basic and familiar tasks with Min A verbal cues.  SLP Short Term Goal 3 (Week 2): Patient will utilize external aids to recall new, daily information with Mod A multimodal cues.  SLP Short Term Goal 4 (Week 2): Patient wil follow multi-step commands with Mod A verbal and visual cues.   Skilled Therapeutic Interventions:  Skilled treatment session focused on cognition goals. SLP received pt attempting to consume regular breakfast tray. Pt self-feeding magic cup with very small bites. SLP reviewed recent esophagram that revealed dysmotility. SLP provided pt with jello, ice cream and thin liquids. Pt required Max A cues to attempt to self-feeding with difficulty consuming any items this session. Pt unable to verbalize specifics regarding current difficulty with consumption. She was not able to follow any commands/directions and required supervision cues for awareness that she had not loaded spoon when attempting to self-feed jello. This Probation officer touched base with PA regarding plan for PO intake. MD is targeting.      Pain Pain Assessment Pain Scale: 0-10 Pain Score: 0-No pain  Therapy/Group: Individual Therapy  Arti Trang 09/29/2018, 12:04 PM

## 2018-09-29 NOTE — Progress Notes (Signed)
  Echocardiogram 2D Echocardiogram has been performed.  Gennavieve Huq L Androw 09/29/2018, 2:45 PM

## 2018-09-29 NOTE — Progress Notes (Signed)
Nutrition Follow-up  RD working remotely.  DOCUMENTATION CODES:   Severe malnutrition in context of acute illness/injury  INTERVENTION:   - Add Ensure Enlive po TID, each supplement provides 350 kcal and 20 grams of protein  - Magic Cup TID with meals, each supplement provides 290 kcal and 9 grams of protein  - Change Pro-stat 30 ml BID from per tube to PO, each supplement provides 100 kcal and 15 grams of protein  - Will keep TF orders in place until decision is made regarding replacement of NGT  NUTRITION DIAGNOSIS:   Severe Malnutrition related to acute illness (sepsis) as evidenced by energy intake < or equal to 50% for > or equal to 5 days, percent weight loss (5.7% weight loss in less than 1 week).  Ongoing, being addressed via oral nutrition supplements  GOAL:   Patient will meet greater than or equal to 90% of their needs  Progressing  MONITOR:   PO intake, Labs, Weight trends, I & O's, Skin, TF tolerance  REASON FOR ASSESSMENT:   Consult Enteral/tube feeding initiation and management  ASSESSMENT:   85 year old female with PMH of T2DM, PAF, CKD, enterovesicular fistula, sinus bradycardiawho was admitted on 09/06/18 with 1 week history of dizziness and weakness. Pt was found to have marked bradycardia,acute on chronic renal failure as well as elevated TSH. Pt developed hypotension and abdominal pain due to urosepsis and was started on IV abx for treatment. CT abdomen revealed acute pancreatitis and treated with NPO and D5NS. Pt has had issues with confusion and agitation due to delirium. Mentation improved briefly but she developed lethargy with hypothermia felt to be due to sepsis. Follow-up CT abdomen 4/27 showed no change in mild acute pancreatitis with increase in diffuse body wall edema with small to moderate bilateral pleural effusions. Pt continues to have intermittent hypothermia and mentation slowly improving. Pt admitted to CIR on 5/04.  5/07 -  post-pyloric Cortrak tube placed under fluoro, continuous TF started, pt later pulled tube 5/08 - gastric Cortrak tube placed by Cortrak team and bridled but pt later pulled, gastric Cortrak tube replaced by Cortrak team, bolus TF to start 5/09 - pt pulled Cortrak, NGT placed by nursing 5/13 - , barium swallow showing mild esophageal dysmotility, pt pulled NGT  Noted pt with an episode of emesis this morning.  Discussed pt with RN via phone call. RN reports MD wants to monitor pt's PO intake today prior to making decision regarding replacement of NGT. Pt has pulled her NGT 4 times now. Do not suspect pt is a candidate for NGT replacement. Will TF orders in place for use in case NGT is replaced.  Will order oral nutrition supplements to help bolster pt's PO intake.  Noted pt's weight is down 3 lbs since admission. Last weight is from 5/13. Will continue to monitor trends.  Reviewed RN edema assessment. Pt with mild pitting generalized edema and moderate pitting edema to BUE and BLE.  Meal Completion: 0-60% x last 8 recorded meals  Medications reviewed and include: Oscal with D, Lasix 20 mg BID, Miralax, KCl 10 mEq daily  Labs reviewed: BUN 31 (H), creatinine 1.56 (H)  UOP: 442 ml x 24 hours I/O's: -1.6 L since admit  Diet Order:   Diet Order            DIET SOFT Room service appropriate? No; Fluid consistency: Thin  Diet effective now              EDUCATION NEEDS:  Not appropriate for education at this time  Skin:  Skin Assessment: Skin Integrity Issues: Stage II: buttocks Incisions: right foot, left food (both present on admission) Other: MASD to buttocks, perineum, and vagina; non-pressure wound to right arm  Last BM:  09/29/18 small type 6  Height:   Ht Readings from Last 1 Encounters:  09/19/18 5\' 6"  (1.676 m)    Weight:   Wt Readings from Last 1 Encounters:  09/28/18 85.9 kg    Ideal Body Weight:  59.1 kg  BMI:  Body mass index is 30.57  kg/m.  Estimated Nutritional Needs:   Kcal:  1550-1750  Protein:  80-95 grams  Fluid:  >/= 1.6 L    Gaynell Face, MS, RD, LDN Inpatient Clinical Dietitian Pager: [phone] Weekend/After Hours: [phone]

## 2018-09-29 NOTE — Progress Notes (Signed)
[facility] PHYSICAL MEDICINE & REHABILITATION PROGRESS NOTE  Subjective/Complaints: Patient seen laying in bed this AM.  She states she slept fairly overnight.  Discussed with covering practitioner, patient pulled NG overnight.  Discussed with nursing, encourage fluids and PO nutrition, pt in agreement. Discussed with PA.    ROS: Denies CP, shortness of breath, nausea, vomiting, diarrhea.   Objective: Vital Signs: Blood pressure (!) 166/76, pulse 83, temperature (!) 97.5 F (36.4 C), temperature source Oral, resp. rate 18, height 5\' 6"  (1.676 m), weight 85.9 kg, SpO2 92 %. Dg Chest 2 View  Result Date: 09/28/2018 CLINICAL DATA:  Edema. EXAM: CHEST - 2 VIEW COMPARISON:  09/06/2018 FINDINGS: The cardiac silhouette is enlarged. There are small moderate bilateral pleural effusions. There is generalized volume overload with developing pulmonary edema. There is no pneumothorax. Enteric tube is noted, the tip of which terminates below the left hemidiaphragm. The patient's previously ingested 13 mm barium tablet again projects near the GE junction. Aortic calcium is noted. Bibasilar atelectasis is seen. IMPRESSION: 1. Cardiomegaly with small to moderate-sized bilateral pleural effusions and generalized volume overload/pulmonary edema. 2. Enteric tube terminates below the left hemidiaphragm. 3. The previously ingested 13 mm barium tablet remains at the GE junction. Electronically Signed   By: Constance Holster M.D.   On: 09/28/2018 15:18   Dg Esophagus W Single Cm (sol Or Thin Ba)  Result Date: 09/28/2018 CLINICAL DATA:  Dysphagia. Poor p.o. intake and regurgitation during meals. EXAM: ESOPHOGRAM/BARIUM SWALLOW TECHNIQUE: Single contrast examination was performed using  thin barium. FLUOROSCOPY TIME:  Fluoroscopy Time:  3 minutes, 18 seconds. Radiation Exposure Index (if provided by the fluoroscopic device): 12.8 mGy Number of Acquired Spot Images: 0 COMPARISON:  None. FINDINGS: Nasogastric tube in place.  Reduced peristalsis with mild tertiary contractions occurred during four of the patient's nine swallows. No obstruction to the forward flow of contrast throughout the esophagus and into the stomach. Normal esophageal course and contour. Normal esophageal mucosal pattern. No esophageal stricture, ulceration, or other significant abnormality. No hiatal hernia. No gastroesophageal reflux occurred spontaneously. A 13 mm barium tablet reached the distal esophagus but did not pass into the stomach. IMPRESSION: 1. Findings consistent with mild esophageal dysmotility. 2. Barium tablet reached the distal esophagus but did not pass into the stomach. No discrete stricture identified. The passage of the tablet may have been inhibited by the indwelling nasogastric tube. Electronically Signed   By: Titus Dubin M.D.   On: 09/28/2018 14:19   Recent Labs    09/26/18 1040  WBC 5.0  HGB 9.0*  HCT 28.7*  PLT 185   Recent Labs    09/28/18 0350 09/29/18 0433  NA 140 140  K 5.1 4.9  CL 104 104  CO2 26 22  GLUCOSE 113* 90  BUN 33* 31*  CREATININE 1.43* 1.56*  CALCIUM 9.1 9.5    Physical Exam: BP (!) 166/76 (BP Location: Left Arm)   Pulse 83   Temp (!) 97.5 F (36.4 C) (Oral)   Resp 18   Ht 5\' 6"  (1.676 m)   Wt 85.9 kg   SpO2 92%   BMI 30.57 kg/m  Constitutional: No distress . Vital signs reviewed. HENT: Normocephalic.  Atraumatic. Eyes: EOMI. No discharge. Cardiovascular: No JVD. Respiratory: Normal effort. GI: Non-distended. Musc: Generalized edema, increasing Neurological: Alert and oriented x3 Motor: Grossly 4-/5 throughout, stable Skin: Frail skin. Psychiatric: Flat  Assessment/Plan: 1. Functional deficits secondary to debility and encephalopathy which require 3+ hours per day of interdisciplinary therapy  in a comprehensive inpatient rehab setting.  Physiatrist is providing close team supervision and 24 hour management of active medical problems listed below.  Physiatrist and  rehab team continue to assess barriers to discharge/monitor patient progress toward functional and medical goals  Care Tool:  Bathing  Bathing activity did not occur: Refused Body parts bathed by patient: Right arm, Left arm, Chest, Abdomen, Face   Body parts bathed by helper: Front perineal area, Buttocks     Bathing assist Assist Level: Moderate Assistance - Patient 50 - 74%     Upper Body Dressing/Undressing Upper body dressing Upper body dressing/undressing activity did not occur (including orthotics): Refused What is the patient wearing?: Hospital gown only    Upper body assist Assist Level: Moderate Assistance - Patient 50 - 74%    Lower Body Dressing/Undressing Lower body dressing    Lower body dressing activity did not occur: Refused What is the patient wearing?: Pants     Lower body assist Assist for lower body dressing: Moderate Assistance - Patient 50 - 74%     Toileting Toileting Toileting Activity did not occur Landscape architect and hygiene only): Safety/medical concerns  Toileting assist Assist for toileting: Total Assistance - Patient < 25%     Transfers Chair/bed transfer  Transfers assist     Chair/bed transfer assist level: Minimal Assistance - Patient > 75%     Locomotion Ambulation   Ambulation assist      Assist level: Minimal Assistance - Patient > 75% Assistive device: Walker-rolling Max distance: 10   Walk 10 feet activity   Assist  Walk 10 feet activity did not occur: Safety/medical concerns  Assist level: Minimal Assistance - Patient > 75% Assistive device: Walker-rolling   Walk 50 feet activity   Assist Walk 50 feet with 2 turns activity did not occur: Safety/medical concerns         Walk 150 feet activity   Assist Walk 150 feet activity did not occur: Safety/medical concerns         Walk 10 feet on uneven surface  activity   Assist Walk 10 feet on uneven surfaces activity did not occur:  Safety/medical concerns         Wheelchair     Assist Will patient use wheelchair at discharge?: No             Wheelchair 50 feet with 2 turns activity    Assist            Wheelchair 150 feet activity     Assist            Medical Problem List and Plan: 1.Functional deficits and weaknesssecondary to debility and encephalopathy after urosepsis/pancreatitis and multiple medical issues  Cont CIR, 15/7  Thyroid function within normal limits 2. Antithrombotics: -PAF/DVT/anticoagulation:Pharmaceutical:Other (comment)- Eliquis 3. Pain Management:Tylenol prn. 4. Mood:LCSW to follow for evaluation and support. -antipsychotic agents: N/A 5. Neuropsych: This patientisnot fully capable of making decisions on herown behalf. 6. Skin/Wound Care:Routine pressure relief measures. 7. Fluids/Electrolytes/Nutrition:Strict I/O.   PO intake slowly improving   Cont NGT   IVF d/ced  Barium swallow showing mild esophageal dysmotility 8. Enterococcus bacteremia/UTI: Completed 14-day course of Maxipime   Amoxicillin 500 mg nightly started on 5/7 9. Anasarca/FLuid overload: Added heart healthy restrictions. Weight daily. Lasix daily prn depending stability of weights/symptoms.  Filed Weights   09/26/18 0617 09/27/18 0346 09/28/18 0543  Weight: 84.4 kg 87.6 kg 85.9 kg   Relatively controlled on 5/13, no weights for today 10.  PAF: Monitor HR bid--back on amiodarone and Eliquis. Off metoprolol due to bradycardia. 11. Urinary retention with recurrent UTIs:   Bladder prolapse  Amoxicillin 500 mg nightly started on 5/7 per IM  Repeat urine culture with yeast-Diflucan x1 completed  Cath for volumes > 350 cc. 12. Acute on Chronic renal failure: SCr- 1.32 on 04/2018. Continue to monitor with routine checks.   Creatinine at 1.56 on 5/14 13. Delirium: Resolving.  14. Anemia of chronic disease: Baseline Hg around 9.0? Likely dropafterhydration.    Monitor for signs of bleeding.   Hemoglobin 9.0 on 5/11  Continue to monitor 15. Acute pancreatitis: Offer supplements with meals.   LFTs elevated on 5/6, labs ordered for tomorrow  Amylase/lipase within normal limits  Ammonia WNL  Continue to monitor 16. Hypothyroidism: Now synthroid 75 mcg/day. 78.  Hypertension - likely secondary to fluid overload  Trending up  Chest x-ray reviewed, showing cardiomegaly.  BNP elevated.  See #20 18.  Hypokalemia  Potassium 4.9 on 5/14  Supplement increased on 5/7, decreased on 5/12, decreased again on 5/13  IV supplement ordered on 5/8 19. Hypotheramia: Resolved  Did not require bear hugger last night, cont to monitor 20. CHF  CXR showing cardiomegaly  BNP elevated  Lasix 20 BID started on 5/14  IV lasix 40x1 ordered on 5/14  Echo 2018 reviewed, showing EF 60-65%, repeat Echo ordered 21. Esophageal Dysmotility  Per barium swallow  GI consulted.  LOS: 10 days A FACE TO FACE EVALUATION WAS PERFORMED  Ankit [name] 09/29/2018, 8:19 AM

## 2018-09-29 NOTE — Progress Notes (Signed)
Physical Therapy Session Note  Patient Details  Name: Maureen Stout MRN: [id number] Date of Birth: 1931-09-27  Today's Date: 09/29/2018 PT Individual Time: 1100-1158 PT Individual Time Calculation (min): 58 min   Short Term Goals: Week 1:  PT Short Term Goal 1 (Week 1): pt will perform functional transfers with min A PT Short Term Goal 1 - Progress (Week 1): Progressing toward goal PT Short Term Goal 2 (Week 1): pt will perform gait x 15' in controlled environment with min A PT Short Term Goal 2 - Progress (Week 1): Progressing toward goal Week 2:  PT Short Term Goal 1 (Week 2): pt will consistently perform transfers at min A PT Short Term Goal 2 (Week 2): pt will perform gait wtih min A x 15' in controlled environment     Skilled Therapeutic Interventions/Progress Updates:   Pt resting in bed.  She stated that she wanted to get OOB.  bil UE edema noted.     With HOB raised, supine> sit EOB with mod assist.  C/o mild dizziness in sitting, improving with time. See vitals.  In sitting, pt appears apraxic RUE , placing it far behind her for support on the bed.  From raised bed, sit> stand with min assist, leaning LEs against bed for support. Wt shifting in standing L><R x 5.  C/o dizziness and assisted to sitting.  Pt rested a couple of minutes; she was unable to state whether she was dizzy or not.  Stand pivot to w/c with min/mod assist.  Pt sat in w/c with bil LE elevated on footstool.  Wc propulsion using bil UEs with max assist/hand over hand for placement of hands and pushing.  Pt would benefit from ELRs to address edema bil LEs.   Gait training iwht w/c follow, with mod assist x 2' with RW, tiny steps.  PT progressed the RW.  Pt stated that she was weak.  Pt presents with crouched position bil LEs, somewhat improved with cues.  Pt stated that she needed to urinate.  Attempted stand pivot transfer with RW to L back to bed, but pt unable to pivot or step.  Squat pivot to L with mod  assist.   Rolling R with  supervision and bed features.  With set-up and assistance, pt able to bridge up slightly to help with placement of bed pain.  Tanya, LPN aware that pt on bedpan at end of session.  Needs left within reach, RUE elevated on pillow,  and bed alarm set.     Therapy Documentation Precautions:  Precautions Precautions: Fall Restrictions Weight Bearing Restrictions: No  Pain: Pain Assessment Pain Scale: 0-10 Pain Score: 0-No pain     Therapy/Group: Individual Therapy  Baldo Hufnagle 09/29/2018, 12:10 PM

## 2018-09-29 NOTE — Progress Notes (Signed)
Occupational Therapy Session Note  Patient Details  Name: Maureen Stout MRN: [id number] Date of Birth: 03-Oct-1931  Today's Date: 09/29/2018 OT Individual Time: 0900-0930 OT Individual Time Calculation (min): 30 min    Short Term Goals: Week 2:  OT Short Term Goal 1 (Week 2): Pt will complete bathing at sit > stand level with min assist OT Short Term Goal 2 (Week 2): Pt will complete toilet transfers with min assist  OT Short Term Goal 3 (Week 2): Pt will complete LB dressing with min assist  Skilled Therapeutic Interventions/Progress Updates:    Patient in bed, agreeable to participate in therapy session.  Sponge bath completed with max A.  UB dressing mod A.  LB dressing max a/dep.  Supine to SSP with max A.  Patient able to stand at bed side briefly for washing of bottom and to change brief - fatigues quickly.  Sit to stand with min a - for 3 trials.  SSP to supine with mod A.  Patient remained in bed with bilateral UEs elevated, bed alarm set and call bell in reach.    Therapy Documentation Precautions:  Precautions Precautions: Fall Restrictions Weight Bearing Restrictions: No General:   Vital Signs: Therapy Vitals Pulse Rate: 77 BP: (!) 156/75 Patient Position (if appropriate): Sitting Pain: Pain Assessment Pain Scale: 0-10 Pain Score: 0-No pain   Other Treatments:     Therapy/Group: Individual Therapy  Carlos Levering 09/29/2018, 12:20 PM

## 2018-09-30 ENCOUNTER — Inpatient Hospital Stay (HOSPITAL_COMMUNITY): Payer: Medicare PPO | Admitting: Occupational Therapy

## 2018-09-30 ENCOUNTER — Inpatient Hospital Stay (HOSPITAL_COMMUNITY): Payer: Medicare PPO

## 2018-09-30 ENCOUNTER — Inpatient Hospital Stay (HOSPITAL_COMMUNITY): Payer: Medicare PPO | Admitting: Physical Therapy

## 2018-09-30 DIAGNOSIS — I5041 Acute combined systolic (congestive) and diastolic (congestive) heart failure: Secondary | ICD-10-CM

## 2018-09-30 LAB — COMPREHENSIVE METABOLIC PANEL
ALT: 53 U/L — ABNORMAL HIGH (ref 0–44)
AST: 46 U/L — ABNORMAL HIGH (ref 15–41)
Albumin: 2.6 g/dL — ABNORMAL LOW (ref 3.5–5.0)
Alkaline Phosphatase: 142 U/L — ABNORMAL HIGH (ref 38–126)
Anion gap: 10 (ref 5–15)
BUN: 29 mg/dL — ABNORMAL HIGH (ref 8–23)
CO2: 25 mmol/L (ref 22–32)
Calcium: 9.1 mg/dL (ref 8.9–10.3)
Chloride: 104 mmol/L (ref 98–111)
Creatinine, Ser: 1.58 mg/dL — ABNORMAL HIGH (ref 0.44–1.00)
GFR calc Af Amer: 34 mL/min — ABNORMAL LOW (ref >60)
GFR calc non Af Amer: 30 mL/min — ABNORMAL LOW (ref >60)
Glucose, Bld: 106 mg/dL — ABNORMAL HIGH (ref 70–99)
Potassium: 4.3 mmol/L (ref 3.5–5.1)
Sodium: 139 mmol/L (ref 135–145)
Total Bilirubin: 0.5 mg/dL (ref 0.3–1.2)
Total Protein: 5.5 g/dL — ABNORMAL LOW (ref 6.5–8.1)

## 2018-09-30 MED ORDER — HYDRALAZINE HCL 10 MG PO TABS
10.0000 mg | ORAL_TABLET | Freq: Three times a day (TID) | ORAL | Status: DC
  Administered 2018-09-30 – 2018-10-02 (×5): 10 mg via ORAL
  Filled 2018-09-30 (×5): qty 1

## 2018-09-30 MED ORDER — FUROSEMIDE 40 MG PO TABS
40.0000 mg | ORAL_TABLET | Freq: Two times a day (BID) | ORAL | Status: DC
  Administered 2018-09-30 – 2018-10-05 (×11): 40 mg via ORAL
  Filled 2018-09-30 (×11): qty 1

## 2018-09-30 MED ORDER — FUROSEMIDE 10 MG/ML IJ SOLN
40.0000 mg | Freq: Once | INTRAMUSCULAR | Status: AC
  Administered 2018-09-30: 40 mg via INTRAVENOUS
  Filled 2018-09-30: qty 4

## 2018-09-30 MED ORDER — ISOSORBIDE DINITRATE 5 MG PO TABS
5.0000 mg | ORAL_TABLET | Freq: Three times a day (TID) | ORAL | Status: DC
  Administered 2018-09-30 – 2018-10-04 (×11): 5 mg via ORAL
  Filled 2018-09-30 (×14): qty 1

## 2018-09-30 NOTE — Progress Notes (Signed)
Speech Language Pathology Daily Session Note  Patient Details  Name: Maureen Stout MRN: [id number] Date of Birth: [birth year]  Today's Date: 09/30/2018 SLP Individual Time: 1300-1345 SLP Individual Time Calculation (min): 45 min  Short Term Goals: Week 2: SLP Short Term Goal 1 (Week 2): Patient will demonstrate sustained attention to functional tasks for 30 minutes with Min A verbal cues for redirection SLP Short Term Goal 2 (Week 2): Patient will demonstrate functional problem solving for basic and familiar tasks with Min A verbal cues.  SLP Short Term Goal 3 (Week 2): Patient will utilize external aids to recall new, daily information with Mod A multimodal cues.  SLP Short Term Goal 4 (Week 2): Patient wil follow multi-step commands with Mod A verbal and visual cues.   Skilled Therapeutic Interventions:  Skilled ST services focused on sustained attention, follow commands, and recall. Patient presented upright in chair with fluctuating alertness and demeanor. Able to sustain attention to unstructured task re: reading personal letters/cards to increase alertness and demeanor, however only sustained for ~2-3 minutes even with max verbal/visual cues. Patient verbalized need to use bathroom, however unable to verbalize use of call button to notify nursing staff. SLP notified nursing staff for assistance to transfer to [facility]. She was able to follow simple 1-step directions given mod-max verbal cues and utilizing concrete, simple commands. Patient then assisted back into bed per patient preference. SLP attempted use of spaced retrieval to verbalize use of call button - able to state "press button" following 2 minute delay given prompt phrase and moderate verbal cues, however attention to task decreased as lethargy increased. Patient refused PO snack during session and stated preference to rest. Patient was left slightly reclined in bed, bed alarm on, bed in lowest position, and call bell within reach. Continue  POC.  Pain Pain Assessment Pain Scale: 0-10 Pain Score: 0-No pain  Therapy/Group: Individual Therapy  Loni Beckwith, M.S. CCC-SLP Speech-Language Pathologist  Loni Beckwith 09/30/2018, 3:12 PM

## 2018-09-30 NOTE — Consult Note (Signed)
Cardiology Consultation:   Patient ID: Maureen Stout MRN: [id number]; DOB: March 25, 1932  Admit date: 09/19/2018 Date of Consult: 09/30/2018  Primary Care Provider: Nicoletta Dress, MD Primary Cardiologist: Maureen More, MD  Primary Electrophysiologist:  None    Patient Profile:   Maureen Stout is a 85 y.o. female with a hx of paroxysmal atrial fibrillation on sinus bradycardia beta-blocker previously held, CKD stage III,, dyslipidemia, hypertension, recent urosepsis and pancreatitis now in CIR who is being seen today for the evaluation of new onset systolic heart failure at the request of Maureen Stout.  History of Present Illness:   Maureen Stout was admitted to the hospital 09/06/18 and found to have sepsis due to enterobacter UTI treated with IV and PO ABX with associated encephalopathy. Hospitalization complicated by declining functional status and delirium, pancreatitis and anemia requiring transfusions. Cardiology was consulted for bradycardia in the 30s. Her beta blocker was held. Amiodarone decreased to 100 mg daily and AC  continued for PAF. She required ongoing therapy and was admitted to CIR on 09/19/18. She was noted to have anasarca and was hypervolemic, treated with PRN lasix and heart healthy diet.   She has been working with multiple therapies and slowly improving. CXR on 5/13 with cardiomegaly, bilateral pleural effusion and generalized volume overload. BNP elevated 242 with a creatinine of 1.58. Lasix increased to 40 mg and repeat echocardiogram ordered. Echo showed new reduction in EF to 30-35%, previously normal in 2018. Diffuse hypokinesis and grade 2 DD with severe pulmonary hypertension and a large left pleural effusion.   On exam, patient is on room air. Remains confused, but not visibly short of breath. All she can tell me is that she feels weak. Denies any other complaints to me.  Past Medical History:  Diagnosis Date  . Acute on chronic anemia 09/07/2018  . Bladder problem   .  CKD (chronic kidney disease), stage III ([place])   . Diabetes mellitus ([place])   . Dyslipidemia   . Hypertension   . Paroxysmal A-fib ([place])   . Paroxysmal atrial flutter ([place])   . Sinus bradycardia     Past Surgical History:  Procedure Laterality Date  . PARTIAL HYSTERECTOMY    . TOE SURGERY    . TONSILLECTOMY       Home Medications:  Prior to Admission medications   Medication Sig Start Date End Date Taking? Authorizing Provider  allopurinol (ZYLOPRIM) 100 MG tablet Take 100 mg by mouth at bedtime.  11/05/14   [provider]  amiodarone (PACERONE) 100 MG tablet Take 1 tablet (100 mg total) by mouth daily. 09/20/18   Maureen Stout, Elhamalsadat, MD  atorvastatin (LIPITOR) 10 MG tablet Take 10 mg by mouth daily.    [provider]  Calcium Carbonate-Vitamin D (CALCIUM-D PO) Take 1 tablet by mouth daily.     [provider]  cefdinir (OMNICEF) 300 MG capsule Take 1 capsule (300 mg total) by mouth 2 (two) times daily. 09/19/18   Maureen Stout, Elhamalsadat, MD  ELIQUIS 2.5 MG TABS tablet TAKE 1 TABLET TWICE DAILY Patient taking differently: Take 2.5 mg by mouth 2 (two) times daily.  07/06/18   Maureen Priest, MD  levothyroxine (SYNTHROID) 75 MCG tablet Take 1 tablet (75 mcg total) by mouth daily before breakfast. 09/20/18   Maureen Stout, Elhamalsadat, MD  Multiple Vitamin (MULTI-VITAMINS) TABS Take 1 tablet by mouth daily.     [provider]  niacin 500 MG CR capsule Take 500 mg by mouth daily.  [provider]  Omega-3 Fatty Acids (FISH OIL) 1200 MG CAPS Take 1,200 mg by mouth 2 (two) times daily.    [provider]  silver sulfADIAZINE (SILVADENE) 1 % cream Apply 1 application topically daily. To foot ulcers Patient taking differently: Apply 1 application topically daily as needed (foot ulcers).  11/11/17   Maureen Stout, DPM  Wound Dressings (MEDIHONEY WOUND/BURN DRESSING) GEL Apply a small amount to wounds daily 04/27/18   Maureen Stout, DPM     Inpatient Medications: Scheduled Meds: . amiodarone  100 mg Oral Daily  . amoxicillin  500 mg Oral QHS  . apixaban  2.5 mg Oral BID  . atorvastatin  10 mg Oral Daily  . calcium-vitamin D  1 tablet Oral BID  . feeding supplement (ENSURE ENLIVE)  237 mL Oral TID BM  . feeding supplement (PRO-STAT SUGAR FREE 64)  30 mL Oral BID  . furosemide  40 mg Oral BID  . Gerhardt's butt cream   Topical BID  . hydrALAZINE  10 mg Oral TID  . isosorbide dinitrate  5 mg Oral TID  . levothyroxine  75 mcg Oral Q0600  . mouth rinse  15 mL Mouth Rinse BID  . polyethylene glycol  17 g Oral Daily  . potassium chloride  10 mEq Oral Daily  . sodium chloride flush  10-40 mL Intracatheter Q12H   Continuous Infusions:  PRN Meds: acetaminophen, alum & mag hydroxide-simeth, bisacodyl, calcium carbonate, diphenhydrAMINE, guaiFENesin-dextromethorphan, polyethylene glycol, prochlorperazine **OR** prochlorperazine **OR** prochlorperazine, senna-docusate, sodium chloride flush, sodium phosphate  Allergies:    Allergies  Allergen Reactions  . Vancomycin Rash    Social History:   Social History   Socioeconomic History  . Marital status: Married    Spouse name: Not on file  . Number of children: Not on file  . Years of education: Not on file  . Highest education level: Not on file  Occupational History  . Not on file  Social Needs  . Financial resource strain: Not on file  . Food insecurity:    Worry: Not on file    Inability: Not on file  . Transportation needs:    Medical: Not on file    Non-medical: Not on file  Tobacco Use  . Smoking status: Never Smoker  . Smokeless tobacco: Never Used  Substance and Sexual Activity  . Alcohol use: No  . Drug use: No  . Sexual activity: Not on file  Lifestyle  . Physical activity:    Days per week: Not on file    Minutes per session: Not on file  . Stress: Not on file  Relationships  . Social connections:    Talks on phone: Not on file    Gets  together: Not on file    Attends religious service: Not on file    Active member of club or organization: Not on file    Attends meetings of clubs or organizations: Not on file    Relationship status: Not on file  . Intimate partner violence:    Fear of current or ex partner: Not on file    Emotionally abused: Not on file    Physically abused: Not on file    Forced sexual activity: Not on file  Other Topics Concern  . Not on file  Social History Narrative  . Not on file    Family History:    Family History  Problem Relation Age of Onset  . Cancer Father   . Breast cancer Sister   .  Diabetes Neg Hx   . Heart attack Neg Hx      ROS:  Please see the history of present illness.  Attempted ROS, all she can say is that she feels weak. Denies all other symptoms.    Physical Exam/Data:   Vitals:   09/29/18 2002 09/30/18 0500 09/30/18 0540 09/30/18 1511  BP: (!) 163/94  (!) 154/97 (!) 146/66  Pulse: 81  81 76  Resp: 19  18 18   Temp: 97.8 F (36.6 C)  97.6 F (36.4 C) 97.9 F (36.6 C)  TempSrc: Oral     SpO2: 94%  93% 96%  Weight:  82.2 kg    Height:        Intake/Output Summary (Last 24 hours) at 09/30/2018 1626 Last data filed at 09/30/2018 1420 Gross per 24 hour  Intake 780 ml  Output -  Net 780 ml   Last 3 Weights 09/30/2018 09/28/2018 09/27/2018  Weight (lbs) 181 lb 3.5 oz 189 lb 6 oz 193 lb 2 oz  Weight (kg) 82.2 kg 85.9 kg 87.6 kg     Body mass index is 29.25 kg/m.  General:  Well nourished, well developed, in no acute distress. Frail appearing HEENT: normal Lymph: no adenopathy Neck: JVD to earlobe at 30 degrees. She is unable to sit up for me for further evaluation Endocrine:  No thryomegaly Vascular: No carotid bruits; RA pulses 2+ bilaterally Cardiac:  normal S1, S2; RRR; no murmur Lungs:  Coarse throughout anterior and laterally, with diminished sounds at bilateral bases Abd: soft, nontender, no hepatomegaly  Ext: diffuse nonpitting edema in bilateral  upper and lower extremities. Musculoskeletal:  Weak but moves all 4 limbs indepedently. Skin: warm and dry  Neuro:  no focal abnormalities noted Psych:  Normal affect   EKG:  The EKG was personally reviewed and demonstrates:  Repeat pending Telemetry:  Telemetry was personally reviewed and demonstrates:  Not on telemetry  Relevant CV Studies:  Echo 09/29/18: IMPRESSIONS  1. The left ventricle has moderate-severely reduced systolic function, with an ejection fraction of 30-35%. The cavity size was mildly dilated. Left ventricular diastolic Doppler parameters are consistent with pseudonormalization. Elevated left atrial  and left ventricular end-diastolic pressures Left ventricular diffuse hypokinesis.  2. The right ventricle has mildly reduced systolic function. The cavity was mildly enlarged. There is no increase in right ventricular wall thickness. Right ventricular systolic pressure is severely elevated with an estimated pressure of 56.6 mmHg.  3. Left atrial size was mildly dilated.  4. Right atrial size was mildly dilated.  5. Large pleural effusion in the left lateral region.  6. Trivial pericardial effusion is present.  7. Tricuspid valve regurgitation is mild-moderate.  8. The aortic valve is tricuspid. Mild thickening of the aortic valve. Mild calcification of the aortic valve. Aortic valve regurgitation is mild by color flow Doppler. No stenosis of the aortic valve.  SUMMARY  When compared to the prior study from 04/29/2017 there has been a significant change, LVEF has decreased from 60-65% to 30-35% with diffuse hypokinesis. Ther eis stage 2 diastolic dysfunction and elevated filling pressures. RVEF is at least mildly decreased. There is severe pulmonary hypertension. Large left pleural effusion.  Laboratory Data:  Chemistry Recent Labs  Lab 09/28/18 0350 09/29/18 0433 09/30/18 0336  NA 140 140 139  K 5.1 4.9 4.3  CL 104 104 104  CO2 26 22 25   GLUCOSE 113* 90 106*   BUN 33* 31* 29*  CREATININE 1.43* 1.56* 1.58*  CALCIUM 9.1 9.5  9.1  GFRNONAA 34* 30* 30*  GFRAA 39* 35* 34*  ANIONGAP 10 14 10     Recent Labs  Lab 09/24/18 0355 09/30/18 0336  PROT 6.0* 5.5*  ALBUMIN 2.5* 2.6*  AST 53* 46*  ALT 71* 53*  ALKPHOS 121 142*  BILITOT 0.8 0.5   Hematology Recent Labs  Lab 09/26/18 0420 09/26/18 1040  WBC 4.7 5.0  RBC 2.65* 3.00*  HGB 8.0* 9.0*  HCT 25.7* 28.7*  MCV 97.0 95.7  MCH 30.2 30.0  MCHC 31.1 31.4  RDW 18.8* 18.6*  PLT 171 185   Cardiac Enzymes Recent Labs  Lab 09/24/18 0355  TROPONINI <0.03   No results for input(s): TROPIPOC in the last 168 hours.  BNP Recent Labs  Lab 09/28/18 1117  BNP 241.8*    DDimer No results for input(s): DDIMER in the last 168 hours.  Radiology/Studies:  Dg Chest 2 View  Result Date: 09/28/2018 CLINICAL DATA:  Edema. EXAM: CHEST - 2 VIEW COMPARISON:  09/06/2018 FINDINGS: The cardiac silhouette is enlarged. There are small moderate bilateral pleural effusions. There is generalized volume overload with developing pulmonary edema. There is no pneumothorax. Enteric tube is noted, the tip of which terminates below the left hemidiaphragm. The patient's previously ingested 13 mm barium tablet again projects near the GE junction. Aortic calcium is noted. Bibasilar atelectasis is seen. IMPRESSION: 1. Cardiomegaly with small to moderate-sized bilateral pleural effusions and generalized volume overload/pulmonary edema. 2. Enteric tube terminates below the left hemidiaphragm. 3. The previously ingested 13 mm barium tablet remains at the GE junction. Electronically Signed   By: Constance Holster M.D.   On: 09/28/2018 15:18   Dg Esophagus W Single Cm (sol Or Thin Ba)  Result Date: 09/28/2018 CLINICAL DATA:  Dysphagia. Poor p.o. intake and regurgitation during meals. EXAM: ESOPHOGRAM/BARIUM SWALLOW TECHNIQUE: Single contrast examination was performed using  thin barium. FLUOROSCOPY TIME:  Fluoroscopy Time:  3  minutes, 18 seconds. Radiation Exposure Index (if provided by the fluoroscopic device): 12.8 mGy Number of Acquired Spot Images: 0 COMPARISON:  None. FINDINGS: Nasogastric tube in place. Reduced peristalsis with mild tertiary contractions occurred during four of the patient's nine swallows. No obstruction to the forward flow of contrast throughout the esophagus and into the stomach. Normal esophageal course and contour. Normal esophageal mucosal pattern. No esophageal stricture, ulceration, or other significant abnormality. No hiatal hernia. No gastroesophageal reflux occurred spontaneously. A 13 mm barium tablet reached the distal esophagus but did not pass into the stomach. IMPRESSION: 1. Findings consistent with mild esophageal dysmotility. 2. Barium tablet reached the distal esophagus but did not pass into the stomach. No discrete stricture identified. The passage of the tablet may have been inhibited by the indwelling nasogastric tube. Electronically Signed   By: Titus Dubin M.D.   On: 09/28/2018 14:19    Assessment and Plan:   1. New onset systolic heart failure - EF 30-35%, diffuse hypokinesis 2. Grade 2 DD 3. Severe pulmonary hypertension - CXR and labs corroborate hypervolemic state. However, her edema is diffuse and nonpitting. Her albumin is only 2.6. I suspect this is multifactorial, as her JVD is elevated suggestive of elevated filling pressures, but her edema is also diffuse, suggestive of a systemic process - lasix was increased to 40 mg BID PO after 1 dose of IV lasix 40 mg - diuresis will be complicated by underlying renal insufficiency and urinary retention (requiring I&O caths). [place] system would be ideal if available. - etiology for reduced function unknown, but diffuse  hypokinesis. Given her current functional status, I do not think she would be a candidate for invasive studies at this time. I would recommend medical management - recommend repeat EKG (last was 4/22) - I&Os  not charted completely, overall net negative ? 1.2L - weight 181 lbs from 193 lbs on 09/27/18. May need to rely on weights for diuresis. - will hold ACEI/ARB in the setting of CKD stage III - initiate goal directed therapy, though this is limited. Had to have beta blocker stopped during recent admission for bradycardia. BP has been mildly elevated, so will start with low dose hydralazine/isordil given her renal function. Hold parameters for SBP <110.  4. Hypertension - pressures elevated - add hydralazine/isordil as above  5. Paroxysmal atrial fibrillation - continue holding BB given bradycardia during admission - amiodarone 100 mg daily - AC with 2.5 mg eliquis for age and renal function - EKG pending, no telemetry available  6. CKD stage III - renal function appears to be at baseline - daily BMP in the setting of IV diuresis - hold ACEI/ARB for now, including entresto  For questions or updates, please contact [facility] Please consult [email or web address] for contact info under   Signed, Buford Dresser, MD  09/30/2018 4:26 PM

## 2018-09-30 NOTE — Progress Notes (Signed)
Physical Therapy Session Note  Patient Details  Name: [name] MRN: [id number] Date of Birth: [birth year]  Today's Date: 09/30/2018 PT Individual Time: 1432-1500 PT Individual Time Calculation (min): 28 min   Short Term Goals: Week 1:  PT Short Term Goal 1 (Week 1): pt will perform functional transfers with min A PT Short Term Goal 1 - Progress (Week 1): Progressing toward goal PT Short Term Goal 2 (Week 1): pt will perform gait x 15' in controlled environment with min A PT Short Term Goal 2 - Progress (Week 1): Progressing toward goal  Skilled Therapeutic Interventions/Progress Updates:    Pt received supine in bed stating "I'm just so confused," "I usually have a good memory but I just can't remember nothing," and "I can't seem to get my barings." Therapist reoriented pt and despite pt knowing where she is located she continues to report being confused, but isn't able to elaborate further. Pt agreeable to therapy session. Supine>sit, HOB partially elevated and using bedrails, with close supervision and significantly increased time. Sit<>stand x2 from elevated EOB>RW with CGA/min assist for balance and from lowered EOB>RW with min/mod assist for lifting into standing. Performed standing balance marches x2 bouts to patient fatigue with cuing for increased upright trunk posture and min assist/CGA for balance throughout. Performed lateral sidestepping at EOB 2 bouts using RW with min assist for balance - continues to require cuing for increased trunk upright and pt demonstrates R lateral trunk lean with increased fatigue. Sit>supine with min assist for B LE management. Pt left supine in bed with needs in reach and bed alarm on.  Therapy Documentation Precautions:  Precautions Precautions: Fall Restrictions Weight Bearing Restrictions: No  Pain: Pt reports no pain during session.   Therapy/Group: Individual Therapy  Tawana Scale, PT, DPT 09/30/2018, 2:39 PM

## 2018-09-30 NOTE — Progress Notes (Signed)
[facility] PHYSICAL MEDICINE & REHABILITATION PROGRESS NOTE  Subjective/Complaints: Patient seen laying in bed this morning.  She states that she slept well overnight.  She states that she is just waking up.  Yesterday echocardiogram was performed.  ROS: Denies CP, shortness of breath, nausea, vomiting, diarrhea.   Objective: Vital Signs: Blood pressure (!) 154/97, pulse 81, temperature 97.6 F (36.4 C), resp. rate 18, height 5\' 6"  (1.676 m), weight 82.2 kg, SpO2 93 %. Dg Chest 2 View  Result Date: 09/28/2018 CLINICAL DATA:  Edema. EXAM: CHEST - 2 VIEW COMPARISON:  09/06/2018 FINDINGS: The cardiac silhouette is enlarged. There are small moderate bilateral pleural effusions. There is generalized volume overload with developing pulmonary edema. There is no pneumothorax. Enteric tube is noted, the tip of which terminates below the left hemidiaphragm. The patient's previously ingested 13 mm barium tablet again projects near the GE junction. Aortic calcium is noted. Bibasilar atelectasis is seen. IMPRESSION: 1. Cardiomegaly with small to moderate-sized bilateral pleural effusions and generalized volume overload/pulmonary edema. 2. Enteric tube terminates below the left hemidiaphragm. 3. The previously ingested 13 mm barium tablet remains at the GE junction. Electronically Signed   By: Constance Holster M.D.   On: 09/28/2018 15:18   Dg Esophagus W Single Cm (sol Or Thin Ba)  Result Date: 09/28/2018 CLINICAL DATA:  Dysphagia. Poor p.o. intake and regurgitation during meals. EXAM: ESOPHOGRAM/BARIUM SWALLOW TECHNIQUE: Single contrast examination was performed using  thin barium. FLUOROSCOPY TIME:  Fluoroscopy Time:  3 minutes, 18 seconds. Radiation Exposure Index (if provided by the fluoroscopic device): 12.8 mGy Number of Acquired Spot Images: 0 COMPARISON:  None. FINDINGS: Nasogastric tube in place. Reduced peristalsis with mild tertiary contractions occurred during four of the patient's nine swallows.  No obstruction to the forward flow of contrast throughout the esophagus and into the stomach. Normal esophageal course and contour. Normal esophageal mucosal pattern. No esophageal stricture, ulceration, or other significant abnormality. No hiatal hernia. No gastroesophageal reflux occurred spontaneously. A 13 mm barium tablet reached the distal esophagus but did not pass into the stomach. IMPRESSION: 1. Findings consistent with mild esophageal dysmotility. 2. Barium tablet reached the distal esophagus but did not pass into the stomach. No discrete stricture identified. The passage of the tablet may have been inhibited by the indwelling nasogastric tube. Electronically Signed   By: Titus Dubin M.D.   On: 09/28/2018 14:19   No results for input(s): WBC, HGB, HCT, PLT in the last 72 hours. Recent Labs    09/29/18 0433 09/30/18 0336  NA 140 139  K 4.9 4.3  CL 104 104  CO2 22 25  GLUCOSE 90 106*  BUN 31* 29*  CREATININE 1.56* 1.58*  CALCIUM 9.5 9.1    Physical Exam: BP (!) 154/97 (BP Location: Left Arm)   Pulse 81   Temp 97.6 F (36.4 C)   Resp 18   Ht 5\' 6"  (1.676 m)   Wt 82.2 kg   SpO2 93%   BMI 29.25 kg/m  Constitutional: No distress . Vital signs reviewed. HENT: Normocephalic.  Atraumatic. Eyes: EOMI. No discharge. Cardiovascular: No JVD. Respiratory: Normal effort. GI: Non-distended. Musc: Generalized edema, stable Neurological: Alert and oriented x to, but states that she just woke up. Motor: Grossly 4-/5 throughout, unchanged Skin: Frail skin. Psychiatric: Flat  Assessment/Plan: 1. Functional deficits secondary to debility and encephalopathy which require 3+ hours per day of interdisciplinary therapy in a comprehensive inpatient rehab setting.  Physiatrist is providing close team supervision and 24 hour management  of active medical problems listed below.  Physiatrist and rehab team continue to assess barriers to discharge/monitor patient progress toward functional  and medical goals  Care Tool:  Bathing  Bathing activity did not occur: Refused Body parts bathed by patient: Right arm, Left arm, Chest, Abdomen, Face   Body parts bathed by helper: Front perineal area, Buttocks     Bathing assist Assist Level: Moderate Assistance - Patient 50 - 74%     Upper Body Dressing/Undressing Upper body dressing Upper body dressing/undressing activity did not occur (including orthotics): Refused What is the patient wearing?: Pull over shirt    Upper body assist Assist Level: Moderate Assistance - Patient 50 - 74%    Lower Body Dressing/Undressing Lower body dressing    Lower body dressing activity did not occur: Refused What is the patient wearing?: Pants, Incontinence brief     Lower body assist Assist for lower body dressing: Total Assistance - Patient < 25%     Toileting Toileting Toileting Activity did not occur Landscape architect and hygiene only): Safety/medical concerns  Toileting assist Assist for toileting: Total Assistance - Patient < 25%     Transfers Chair/bed transfer  Transfers assist     Chair/bed transfer assist level: Moderate Assistance - Patient 50 - 74%     Locomotion Ambulation   Ambulation assist      Assist level: Moderate Assistance - Patient 50 - 74% Assistive device: Walker-rolling Max distance: 2   Walk 10 feet activity   Assist  Walk 10 feet activity did not occur: Safety/medical concerns  Assist level: Minimal Assistance - Patient > 75% Assistive device: Walker-rolling   Walk 50 feet activity   Assist Walk 50 feet with 2 turns activity did not occur: Safety/medical concerns         Walk 150 feet activity   Assist Walk 150 feet activity did not occur: Safety/medical concerns         Walk 10 feet on uneven surface  activity   Assist Walk 10 feet on uneven surfaces activity did not occur: Safety/medical concerns         Wheelchair     Assist Will patient use  wheelchair at discharge?: No Type of Wheelchair: Manual    Wheelchair assist level: Maximal Assistance - Patient 25 - 49% Max wheelchair distance: 10    Wheelchair 50 feet with 2 turns activity    Assist            Wheelchair 150 feet activity     Assist            Medical Problem List and Plan: 1.Functional deficits and weaknesssecondary to debility and encephalopathy after urosepsis/pancreatitis and multiple medical issues  Cont CIR, 15/7  Thyroid function within normal limits 2. Antithrombotics: -PAF/DVT/anticoagulation:Pharmaceutical:Other (comment)- Eliquis 3. Pain Management:Tylenol prn. 4. Mood:LCSW to follow for evaluation and support. -antipsychotic agents: N/A 5. Neuropsych: This patientisnot fully capable of making decisions on herown behalf. 6. Skin/Wound Care:Routine pressure relief measures. 7. Fluids/Electrolytes/Nutrition:Strict I/O.   PO intake slowly improving  IVF d/ced  Barium swallow showing mild esophageal dysmotility 8. Enterococcus bacteremia/UTI: Completed 14-day course of Maxipime   Amoxicillin 500 mg nightly started on 5/7 9. Anasarca/FLuid overload: Added heart healthy restrictions. Weight daily. Lasix daily prn depending stability of weights/symptoms.  Filed Weights   09/27/18 0346 09/28/18 0543 09/30/18 0500  Weight: 87.6 kg 85.9 kg 82.2 kg   ?  Reliability on 5/15 10. PAF: Monitor HR bid--back on amiodarone and Eliquis. Off metoprolol  due to bradycardia. 11. Urinary retention with recurrent UTIs:   Bladder prolapse  Amoxicillin 500 mg nightly started on 5/7 per IM  Repeat urine culture with yeast-Diflucan x1 completed  Cath for volumes > 350 cc. 12. Acute on Chronic renal failure: SCr- 1.32 on 04/2018. Continue to monitor with routine checks.   Creatinine at 1.58 on 5/15, labs ordered for tomorrow   Labs ordered for Monday 13. Delirium: Resolving.  14. Anemia of chronic disease: Baseline Hg  around 9.0? Likely dropafterhydration.   Monitor for signs of bleeding.   Hemoglobin 9.0 on 5/11   Labs ordered for Monday  Continue to monitor 15. Acute pancreatitis: Offer supplements with meals.   LFTs elevated, but improving on 5/15  Amylase/lipase within normal limits  Ammonia WNL  Continue to monitor 16. Hypothyroidism: Now synthroid 75 mcg/day. 43.  Hypertension - likely secondary to fluid overload  Remains elevated on 5/15  Chest x-ray reviewed, showing cardiomegaly.  BNP elevated.  See #20 18.  Hypokalemia  Potassium 4.3 on 5/15, labs ordered for tomorrow   Labs ordered for Monday  Supplement increased on 5/7, decreased on 5/12, decreased again on 5/13  IV supplement ordered on 5/8 19. Hypotheramia: Resolved  Did not require bear hugger last night, cont to monitor 20. CHF-resulting in severe fluid overload  CXR showing cardiomegaly  BNP elevated  Lasix 20 BID started on 5/14, increased on 5/15  IV lasix 40x1 ordered on 5/14, again on 5/15  Echo 2018 reviewed, showing EF 60-65%, repeat Echo showing significant decrease in ejection fraction of 54-56% with diastolic heart failure as well.  Will consult cardiology.  Discussed with PA.  I/Os do not appear accurate 21. Esophageal Dysmotility  Per barium swallow  Per GI monitor now that NG tube has been removed.  LOS: 11 days A FACE TO FACE EVALUATION WAS PERFORMED  Maureen Stout 09/30/2018, 8:54 AM

## 2018-09-30 NOTE — Progress Notes (Signed)
Occupational Therapy Session Note  Patient Details  Name: Maureen Stout MRN: [id number] Date of Birth: [birth year]  Today's Date: 09/30/2018 OT Individual Time: 1002-1100 OT Individual Time Calculation (min): 58 min    Short Term Goals: Week 2:  OT Short Term Goal 1 (Week 2): Pt will complete bathing at sit > stand level with min assist OT Short Term Goal 2 (Week 2): Pt will complete toilet transfers with min assist  OT Short Term Goal 3 (Week 2): Pt will complete LB dressing with min assist  Skilled Therapeutic Interventions/Progress Updates:    Patient supine in bed upon arrival, alert and agreeable to participate in therapy session.  Supine to SSP with CS.  SPT bed to w/c min A.  Patient incontinent of bowel and bladder contained in brief.   Completed SPT to commode with minA.  She was not able to determine if she could void more so remained sitting on commode for LB washing and dressing which both required max A.  Hygiene and pants up/down dependent. SPT from commode to w/c with mod A due to increased fatigue.  Patient completed UB washing, dressing and grooming at w/c level at sink - min A for UB bathing, mod A for OH shirt, mod A for oral care, max A for hair care.  Patient requested to stay sitting up at close of session.  Transferred to recliner for safety with mod A, placed in semi reclined position with LEs elevated and bilateral UEs placed on pillows, seat belt alarm set and call bell in reach.  Nursing aware of positioning.    Therapy Documentation Precautions:  Precautions Precautions: Fall Restrictions Weight Bearing Restrictions: No General:   Vital Signs:  Pain: Pain Assessment Pain Scale: 0-10 Pain Score: 0-No pain ADL:   Vision   Perception    Praxis   Exercises:   Other Treatments:     Therapy/Group: Individual Therapy  Carlos Levering 09/30/2018, 12:30 PM

## 2018-10-01 ENCOUNTER — Inpatient Hospital Stay (HOSPITAL_COMMUNITY): Payer: Medicare PPO

## 2018-10-01 ENCOUNTER — Inpatient Hospital Stay (HOSPITAL_COMMUNITY): Payer: Medicare PPO | Admitting: Occupational Therapy

## 2018-10-01 DIAGNOSIS — Z4659 Encounter for fitting and adjustment of other gastrointestinal appliance and device: Secondary | ICD-10-CM

## 2018-10-01 LAB — BASIC METABOLIC PANEL
Anion gap: 8 (ref 5–15)
BUN: 30 mg/dL — ABNORMAL HIGH (ref 8–23)
CO2: 25 mmol/L (ref 22–32)
Calcium: 8.1 mg/dL — ABNORMAL LOW (ref 8.9–10.3)
Chloride: 107 mmol/L (ref 98–111)
Creatinine, Ser: 1.37 mg/dL — ABNORMAL HIGH (ref 0.44–1.00)
GFR calc Af Amer: 41 mL/min — ABNORMAL LOW (ref >60)
GFR calc non Af Amer: 35 mL/min — ABNORMAL LOW (ref >60)
Glucose, Bld: 92 mg/dL (ref 70–99)
Potassium: 4.1 mmol/L (ref 3.5–5.1)
Sodium: 140 mmol/L (ref 135–145)

## 2018-10-01 MED ORDER — ONDANSETRON HCL 4 MG PO TABS: 4.0000 mg | ORAL_TABLET | Freq: Three times a day (TID) | ORAL | Status: DC | PRN

## 2018-10-01 MED ORDER — ONDANSETRON HCL 4 MG/2ML IJ SOLN: 4.0000 mg | Freq: Three times a day (TID) | INTRAMUSCULAR | Status: DC | PRN

## 2018-10-01 MED ORDER — ONDANSETRON HCL 4 MG/2ML IJ SOLN
4.0000 mg | Freq: Three times a day (TID) | INTRAMUSCULAR | Status: DC | PRN
  Administered 2018-10-01: 4 mg via INTRAVENOUS
  Filled 2018-10-01: qty 2

## 2018-10-01 NOTE — Plan of Care (Signed)
  Problem: RH BLADDER ELIMINATION Goal: RH STG MANAGE BLADDER WITH ASSISTANCE Description STG Manage Bladder With Min  Assistance  Outcome: Not Progressing; incontinence   

## 2018-10-01 NOTE — Progress Notes (Signed)
Physical Therapy Session Note  Patient Details  Name: Maureen Stout MRN: [id number] Date of Birth: [birth year]  Today's Date: 10/01/2018 PT Individual Time: 1302-1317 PT Individual Time Calculation (min): 15 min   Short Term Goals: Week 2:  PT Short Term Goal 1 (Week 2): pt will consistently perform transfers at min A PT Short Term Goal 2 (Week 2): pt will perform gait wtih min A x 15' in controlled environment  Skilled Therapeutic Interventions/Progress Updates:     Patient in bed upon PT arrival. Patient was very lethargic, however responded to PT calling her name, she stated "I just feel terrible" at beginning of session, but was agreeable to trying to sit up with PT. Patient required mod A to sit EOB with HOB elevated and sat EOB for 30 seconds before stating "I have to lie down." Patient was assisted to sidelying with mod A and required min A for rolling to supine and total A of 2 for scooting up in the bed. Patient was initially agreeable to trying bed level exercising, however was unable to stay awake when attempting heel slides x1. Patient was difficult to arouse once in supine and missed 60 minutes of skilled PT due to lethargy this afternoon. PT provided pillows under her LEs and between her knees for comfort and the patient was in bed with breaks locked, bed alarm set, and all needs within reach at end of session.    Therapy Documentation Precautions:  Precautions Precautions: Fall Restrictions Weight Bearing Restrictions: No Vital Signs:  Pain:  Patient denied pain.    Therapy/Group: Individual Therapy  Marcelus Dubberly L Annebelle Bostic PT, DPT  10/01/2018, 1:47 PM

## 2018-10-01 NOTE — Progress Notes (Addendum)
Occupational Therapy Session Note  Patient Details  Name: Maureen Stout MRN: [id number] Date of Birth: 1931-08-08  Patient individual therapy session [phone] for 22 minutes in dividual therapy. She missed 38 minutes of occupational therapy this date.  Skilled Therapeutic Interventions/Progress Updates: Patient c/o nausea and earlier vomiting and could barely speak to this clinician stating she was too sick to do anything.   Also she stated she was thirsty.  This clinician spoke with nurse to relay this info.   RN gave anti nausea and other medications.    This clinician offered water, ginergale, coke and other drinks, as well as the Ensure the RN suggested, but patient stated she does not like gingerale and that she is afraid she will throw up the other liquids.  Patient tolerated oral care session with difficulty initiating steps of the task and tended to hold the brush in her mouth; however, with moderate hand over hand assistance (alternating using hands), she opened mouth and began to actually brush teeth.   She stated she was very nauseated and needed to rest.  Patient allowed to rest and left with call bell and phone within reach.\   Continue OT Plan of care     Therapy Documentation Precautions:  Precautions Precautions: Fall Restrictions Weight Bearing Restrictions: No  Pain:denied    Therapy/Group: Individual Therapy  Maureen Stout [facility] 10/01/2018, 9:07 AM

## 2018-10-01 NOTE — Progress Notes (Addendum)
Physical Therapy Note  Patient Details  Name: Maureen Stout MRN: [id number] Date of Birth: 07/02/1931 Today's Date: 10/01/2018    Attempted PT session with patient this morning. Patient reported having significant nausea, despite medication provided by RN. Patient declined working with PT due to nausea, RN made aware. PT will re-attempt at a later time as able.    Catrina Fellenz L Collen Hostler PT, DPT  10/01/2018, 1:35 PM

## 2018-10-01 NOTE — Progress Notes (Signed)
Per therapy patient c/o nausea still after giving IM promethazine and does not want to participate in therapy . Pt claims she still has nausea but sleepy. MD notified new orders noted.

## 2018-10-01 NOTE — Progress Notes (Addendum)
Maureen Stout is a 85 y.o. female [birth year] [id number]  Subjective: No new complaints. No new problems. Slept well. Feeling OK.  Objective: Vital signs in last 24 hours: Temp:  [97.6 F (36.4 C)-98.3 F (36.8 C)] 98.3 F (36.8 C) (05/16 0627) Pulse Rate:  [73-78] 76 (05/16 1454) Resp:  [14-17] 14 (05/16 1454) BP: (130-162)/(65-80) 130/65 (05/16 1454) SpO2:  [92 %-98 %] 92 % (05/16 1454) Weight:  [79.5 kg] 79.5 kg (05/16 0627) Weight change: -2.7 kg Last BM Date: 09/29/18  Intake/Output from previous day: 05/15 0701 - 05/16 0700 In: 800 [P.O.:800] Out: -  Last cbgs: CBG (last 3)  No results for input(s): GLUCAP in the last 72 hours.   Physical Exam General: No apparent distress   HEENT: not dry Lungs: Normal effort. Lungs clear to auscultation, no crackles or wheezes. Cardiovascular: irregular rate and rhythm, trace edema Abdomen: S/NT/ND; BS(+) Musculoskeletal:  unchanged Neurological: No new neurological deficits Wounds: N/A    Skin: clear  Aging changes Mental state: Alert,  cooperative    Lab Results: BMET    Component Value Date/Time   NA 140 10/01/2018 0414   NA 138 04/18/2018 0000   K 4.1 10/01/2018 0414   CL 107 10/01/2018 0414   CO2 25 10/01/2018 0414   GLUCOSE 92 10/01/2018 0414   BUN 30 (H) 10/01/2018 0414   BUN 31 (H) 04/18/2018 0000   CREATININE 1.37 (H) 10/01/2018 0414   CALCIUM 8.1 (L) 10/01/2018 0414   GFRNONAA 35 (L) 10/01/2018 0414   GFRAA 41 (L) 10/01/2018 0414   CBC    Component Value Date/Time   WBC 5.0 09/26/2018 1040   RBC 3.00 (L) 09/26/2018 1040   HGB 9.0 (L) 09/26/2018 1040   HCT 28.7 (L) 09/26/2018 1040   PLT 185 09/26/2018 1040   MCV 95.7 09/26/2018 1040   MCH 30.0 09/26/2018 1040   MCHC 31.4 09/26/2018 1040   RDW 18.6 (H) 09/26/2018 1040   LYMPHSABS 1.2 09/26/2018 1040   MONOABS 0.5 09/26/2018 1040   EOSABS 0.0 09/26/2018 1040   BASOSABS 0.0 09/26/2018 1040    Studies/Results: No results found.  Medications: I  have reviewed the patient's current medications.  Assessment/Plan:  1.  Functional deficit and weakness due to debility and encephalopathy after urosepsis/pancreatitis and multiple medical issues.  Continue CIR 2.  DVT prophylaxis with Eliquis 3.  Pain management with Tylenol PRN 4.  Emotional support 5.  Enterococcus bacteremia/UTI.  The patient completed a 14-day course of oral Maxipime.  On amoxicillin nightly 6.  Acute on chronic renal failure.  Continue to monitor creatinine 7.  Delirium.  Resolving 8.  Anemia of chronic disease.  Monitor hemoglobin 9.  Acute pancreatitis.  Amylase/lipase was within normal limits 10.  Hypothyroidism.  On Synthroid 11.  Hypertension. 12.  Hypokalemia.  Recheck potassium 13.  CHF.  On Lasix.  Cardiology is following 14.  Esophageal dysmotility.  GI was consulted       Length of stay, days: 12  Walker Kehr , MD 10/01/2018, 3:56 PM

## 2018-10-01 NOTE — Progress Notes (Signed)
Progress Note   Subjective   Doing well today, the patient denies CP or SOB.  No new concerns  Inpatient Medications    Scheduled Meds: . amiodarone  100 mg Oral Daily  . amoxicillin  500 mg Oral QHS  . apixaban  2.5 mg Oral BID  . atorvastatin  10 mg Oral Daily  . calcium-vitamin D  1 tablet Oral BID  . feeding supplement (ENSURE ENLIVE)  237 mL Oral TID BM  . feeding supplement (PRO-STAT SUGAR FREE 64)  30 mL Oral BID  . furosemide  40 mg Oral BID  . Gerhardt's butt cream   Topical BID  . hydrALAZINE  10 mg Oral TID  . isosorbide dinitrate  5 mg Oral TID  . levothyroxine  75 mcg Oral Q0600  . mouth rinse  15 mL Mouth Rinse BID  . polyethylene glycol  17 g Oral Daily  . potassium chloride  10 mEq Oral Daily  . sodium chloride flush  10-40 mL Intracatheter Q12H   Continuous Infusions:  PRN Meds: acetaminophen, alum & mag hydroxide-simeth, bisacodyl, calcium carbonate, diphenhydrAMINE, guaiFENesin-dextromethorphan, ondansetron (ZOFRAN) IV **OR** ondansetron, polyethylene glycol, prochlorperazine **OR** prochlorperazine **OR** prochlorperazine, senna-docusate, sodium chloride flush, sodium phosphate   Vital Signs    Vitals:   09/30/18 0540 09/30/18 1511 09/30/18 2017 10/01/18 0627  BP: (!) 154/97 (!) 146/66 138/75 (!) 162/80  Pulse: 81 76 73 78  Resp: 18 18 14 17   Temp: 97.6 F (36.4 C) 97.9 F (36.6 C) 97.6 F (36.4 C) 98.3 F (36.8 C)  TempSrc:   Oral   SpO2: 93% 96% 98%   Weight:    79.5 kg  Height:        Intake/Output Summary (Last 24 hours) at 10/01/2018 1327 Last data filed at 10/01/2018 0944 Gross per 24 hour  Intake 715 ml  Output -  Net 715 ml   Filed Weights   09/28/18 0543 09/30/18 0500 10/01/18 0627  Weight: 85.9 kg 82.2 kg 79.5 kg      Physical Exam   GEN- The patient is elderly appearing, alert and oriented x 3 today.  frail Head- normocephalic, atraumatic Eyes-  Sclera clear, conjunctiva pink Ears- hearing intact Oropharynx- clear  Neck- supple, + JVD Lungs- normal work of breathing Heart- Regular rate and rhythm  GI- soft, NT, ND, + BS Extremities- no clubbing, cyanosis, + dependant edema  MS- diffuse atrophy Skin- no rash or lesion Psych- euthymic mood, full affect Neuro- strength and sensation are intact   Labs    Chemistry Recent Labs  Lab 09/29/18 0433 09/30/18 0336 10/01/18 0414  NA 140 139 140  K 4.9 4.3 4.1  CL 104 104 107  CO2 22 25 25   GLUCOSE 90 106* 92  BUN 31* 29* 30*  CREATININE 1.56* 1.58* 1.37*  CALCIUM 9.5 9.1 8.1*  PROT  --  5.5*  --   ALBUMIN  --  2.6*  --   AST  --  46*  --   ALT  --  53*  --   ALKPHOS  --  142*  --   BILITOT  --  0.5  --   GFRNONAA 30* 30* 35*  GFRAA 35* 34* 41*  ANIONGAP 14 10 8      Hematology Recent Labs  Lab 09/26/18 0420 09/26/18 1040  WBC 4.7 5.0  RBC 2.65* 3.00*  HGB 8.0* 9.0*  HCT 25.7* 28.7*  MCV 97.0 95.7  MCH 30.2 30.0  MCHC 31.1 31.4  RDW 18.8* 18.6*  PLT 171 185    Cardiac EnzymesNo results for input(s): TROPONINI in the last 168 hours. No results for input(s): TROPIPOC in the last 168 hours.      Patient Profile:   Maureen Stout is a 85 y.o. female with a hx of paroxysmal atrial fibrillation on sinus bradycardia beta-blocker previously held, CKD stage III,, dyslipidemia, hypertension, recent urosepsis and pancreatitis now in CIR who is being seen today for the evaluation of new onset systolic heart failure at the request of Dr. Posey Pronto.  Assessment & Plan    1.  Acute combined systolic and diastolic CHF (EF 94%) with severe pulmonary hypertension Remains volume overloaded but appears to be diuresing. Continue current lasix. I agree with Dr Harrell Gave that she is not a candidate for invasive procedures. Holding ACEi/ARB for now given stage III renal failure Off beta blocker due to prior bradycardia Doing well with imdur/hydralazine.  Will likely titrate if BP remains elevated tomorrow.  2 afib Continue amiodarone and  eliquis  3. HTN Elevated Recently started on imdur and hydralazine.  Continue to titrate tomorrow if BP is still elevated  4. Stage III renal failure Stable No change required today  Thompson Grayer MD, [facility] 10/01/2018 1:27 PM

## 2018-10-02 ENCOUNTER — Inpatient Hospital Stay (HOSPITAL_COMMUNITY): Payer: Medicare PPO | Admitting: Occupational Therapy

## 2018-10-02 ENCOUNTER — Inpatient Hospital Stay (HOSPITAL_COMMUNITY): Payer: Medicare PPO

## 2018-10-02 MED ORDER — HYDRALAZINE HCL 25 MG PO TABS
25.0000 mg | ORAL_TABLET | Freq: Three times a day (TID) | ORAL | Status: DC
  Administered 2018-10-02 – 2018-10-04 (×6): 25 mg via ORAL
  Filled 2018-10-02 (×6): qty 1

## 2018-10-02 NOTE — Progress Notes (Signed)
Progress Note   Subjective   General malaise no cardiac symptoms   Inpatient Medications    Scheduled Meds: . amiodarone  100 mg Oral Daily  . amoxicillin  500 mg Oral QHS  . apixaban  2.5 mg Oral BID  . atorvastatin  10 mg Oral Daily  . calcium-vitamin D  1 tablet Oral BID  . feeding supplement (ENSURE ENLIVE)  237 mL Oral TID BM  . feeding supplement (PRO-STAT SUGAR FREE 64)  30 mL Oral BID  . furosemide  40 mg Oral BID  . Gerhardt's butt cream   Topical BID  . hydrALAZINE  10 mg Oral TID  . isosorbide dinitrate  5 mg Oral TID  . levothyroxine  75 mcg Oral Q0600  . mouth rinse  15 mL Mouth Rinse BID  . polyethylene glycol  17 g Oral Daily  . potassium chloride  10 mEq Oral Daily  . sodium chloride flush  10-40 mL Intracatheter Q12H   Continuous Infusions:  PRN Meds: acetaminophen, alum & mag hydroxide-simeth, bisacodyl, calcium carbonate, diphenhydrAMINE, guaiFENesin-dextromethorphan, ondansetron (ZOFRAN) IV **OR** ondansetron, polyethylene glycol, prochlorperazine **OR** prochlorperazine **OR** prochlorperazine, senna-docusate, sodium chloride flush, sodium phosphate   Vital Signs    Vitals:   10/01/18 0627 10/01/18 1454 10/01/18 1954 10/02/18 0422  BP: (!) 162/80 130/65 137/66 (!) 146/74  Pulse: 78 76 74 79  Resp: 17 14 16 14   Temp: 98.3 F (36.8 C)  97.6 F (36.4 C) (!) 97.5 F (36.4 C)  TempSrc:      SpO2:  92% 94% 93%  Weight: 79.5 kg   79.6 kg  Height:        Intake/Output Summary (Last 24 hours) at 10/02/2018 0913 Last data filed at 10/02/2018 0800 Gross per 24 hour  Intake 517 ml  Output -  Net 517 ml   Filed Weights   09/30/18 0500 10/01/18 0627 10/02/18 0422  Weight: 82.2 kg 79.5 kg 79.6 kg      Physical Exam   GEN- The patient is elderly appearing, alert and oriented x 3 today.  frail Head- normocephalic, atraumatic Eyes-  Sclera clear, conjunctiva pink Ears- hearing intact Oropharynx- clear Neck- supple, + JVD Lungs- normal work of  breathing Heart- Regular rate and rhythm  GI- soft, NT, ND, + BS Extremities- no clubbing, cyanosis, + dependant edema  MS- diffuse atrophy Skin- no rash or lesion Psych- euthymic mood, full affect Neuro- strength and sensation are intact Post bilateral TKR Duoderm on RUE    Labs    Chemistry Recent Labs  Lab 09/29/18 0433 09/30/18 0336 10/01/18 0414  NA 140 139 140  K 4.9 4.3 4.1  CL 104 104 107  CO2 22 25 25   GLUCOSE 90 106* 92  BUN 31* 29* 30*  CREATININE 1.56* 1.58* 1.37*  CALCIUM 9.5 9.1 8.1*  PROT  --  5.5*  --   ALBUMIN  --  2.6*  --   AST  --  46*  --   ALT  --  53*  --   ALKPHOS  --  142*  --   BILITOT  --  0.5  --   GFRNONAA 30* 30* 35*  GFRAA 35* 34* 41*  ANIONGAP 14 10 8      Hematology Recent Labs  Lab 09/26/18 0420 09/26/18 1040  WBC 4.7 5.0  RBC 2.65* 3.00*  HGB 8.0* 9.0*  HCT 25.7* 28.7*  MCV 97.0 95.7  MCH 30.2 30.0  MCHC 31.1 31.4  RDW 18.8* 18.6*  PLT  171 185    Cardiac EnzymesNo results for input(s): TROPONINI in the last 168 hours. No results for input(s): TROPIPOC in the last 168 hours.      Patient Profile:   Maureen Stout is a 85 y.o. female with a hx of paroxysmal atrial fibrillation on sinus bradycardia beta-blocker previously held, CKD stage III,, dyslipidemia, hypertension, recent urosepsis and pancreatitis now in CIR who is being seen today for the evaluation of new onset systolic heart failure at the request of Dr. Posey Pronto.  Assessment & Plan    1.  Acute combined systolic and diastolic CHF (EF 11%) with severe pulmonary hypertension Continue bid lasix nitrates and hydralazine increase latter to 25 tid .  2 afib Continue amiodarone and eliquis in NSR on exam   3. HTN See above increasing hydralazine   4. Stage III renal failure Stable No change required today  Thompson Grayer MD, [facility] 10/02/2018 9:13 AM

## 2018-10-02 NOTE — Progress Notes (Addendum)
Maureen Stout is a 85 y.o. female 30-Oct-1931 [id number]  Subjective: No new complaints. No new problems. Slept well. Feeling OK.  Objective: Vital signs in last 24 hours: Temp:  [97.5 F (36.4 C)-97.6 F (36.4 C)] 97.5 F (36.4 C) (05/17 0422) Pulse Rate:  [74-79] 79 (05/17 0422) Resp:  [14-16] 14 (05/17 0422) BP: (130-146)/(65-74) 146/74 (05/17 0422) SpO2:  [92 %-94 %] 93 % (05/17 0422) Weight:  [79.6 kg] 79.6 kg (05/17 0422) Weight change: 0.1 kg Last BM Date: 09/29/18  Intake/Output from previous day: 05/16 0701 - 05/17 0700 In: 595 [P.O.:138] Out: -  Last cbgs: CBG (last 3)  No results for input(s): GLUCAP in the last 72 hours.   Physical Exam General: No apparent distress   HEENT: not dry Lungs: Normal effort. Lungs clear to auscultation, no crackles or wheezes. Cardiovascular: irregular rate and rhythm, trace edema Abdomen: S/NT/ND; BS(+) Musculoskeletal:  unchanged Neurological: No new neurological deficits Wounds: N/A    Skin: clear  Aging changes Mental state: Alert,  cooperative    Lab Results: BMET    Component Value Date/Time   NA 140 10/01/2018 0414   NA 138 04/18/2018 0000   K 4.1 10/01/2018 0414   CL 107 10/01/2018 0414   CO2 25 10/01/2018 0414   GLUCOSE 92 10/01/2018 0414   BUN 30 (H) 10/01/2018 0414   BUN 31 (H) 04/18/2018 0000   CREATININE 1.37 (H) 10/01/2018 0414   CALCIUM 8.1 (L) 10/01/2018 0414   GFRNONAA 35 (L) 10/01/2018 0414   GFRAA 41 (L) 10/01/2018 0414   CBC    Component Value Date/Time   WBC 5.0 09/26/2018 1040   RBC 3.00 (L) 09/26/2018 1040   HGB 9.0 (L) 09/26/2018 1040   HCT 28.7 (L) 09/26/2018 1040   PLT 185 09/26/2018 1040   MCV 95.7 09/26/2018 1040   MCH 30.0 09/26/2018 1040   MCHC 31.4 09/26/2018 1040   RDW 18.6 (H) 09/26/2018 1040   LYMPHSABS 1.2 09/26/2018 1040   MONOABS 0.5 09/26/2018 1040   EOSABS 0.0 09/26/2018 1040   BASOSABS 0.0 09/26/2018 1040    Studies/Results: No results found.  Medications: I  have reviewed the patient's current medications.  Assessment/Plan:  1.  Functional deficits of the weakness due to debility and encephalopathy after urosepsis/pancreatitis and multiple medical issues.  Continue CIR 2.  DVT prophylaxis with Eliquis 3.  Pain management with Tylenol PRN 4.  Emotional support 5.  Enterococcus bacteremia/UTI.  The patient completed a 14-day course of Maxipime.  On amoxicillin nightly for prophylaxis 6.  Acute on chronic renal failure.  Continue to monitor creatinine 7.  Delirium.  Resolving 8.  Anemia of chronic disease.  Monitor hemoglobin 9.  Acute pancreatitis.  Amylase/lipase was within normal limits 10.  Hypothyroidism.  On Synthroid 11.  Hypertension. 12.  Hypokalemia.  We will recheck potassium 13.  CHF.  On Lasix.  [name] help is very appreciated 68.  Esophageal dysmotility.  GI was consulted 15.  Atrial fibrillation.  On Eliquis     Length of stay, days: [place] , MD 10/02/2018, 12:06 PM

## 2018-10-02 NOTE — Plan of Care (Signed)
  Problem: RH BLADDER ELIMINATION Goal: RH STG MANAGE BLADDER WITH ASSISTANCE Description STG Manage Bladder With Min Assistance  Outcome: Not Progressing; incontinence Problem: RH SKIN INTEGRITY Goal: RH STG SKIN FREE OF INFECTION/BREAKDOWN Description With mod. assist  Outcome: Progressing; MASD bottom

## 2018-10-02 NOTE — Progress Notes (Signed)
Occupational Therapy Session Note  Patient Details  Name: Maureen Stout MRN: [id number] Date of Birth: [birth year]  Today's Date: 10/02/2018 OT Individual Time: 1000-1015 OT Individual Time Calculation (min): 15 min   Skilled Therapeutic Interventions/Progress Updates:    Patient in supine, agreeable for therapy session.  Supine to SSP with min A.  Sit to stand min A, able to tolerate standing for 1-2 minutes.  Completed unsupported sitting UE and LE mobility exercises with CS/CG, tends to lean to the right today - cues to correct.  3-4 reps of each exercise with fatigue noted and rest breaks needed.  Patient returned to supine position with mod A.  Positioned with bilateral UEs elevated on pillows.  Bed alarm set and call bell in reach.     Therapy Documentation Precautions:  Precautions Precautions: Fall Restrictions Weight Bearing Restrictions: No General:   Vital Signs:  Pain: Pain Assessment Pain Scale: 0-10 Pain Score: 0-No pain   Other Treatments:     Therapy/Group: Individual Therapy  Carlos Levering 10/02/2018, 11:33 AM

## 2018-10-03 ENCOUNTER — Inpatient Hospital Stay (HOSPITAL_COMMUNITY): Payer: Medicare PPO

## 2018-10-03 ENCOUNTER — Inpatient Hospital Stay (HOSPITAL_COMMUNITY): Payer: Medicare PPO | Admitting: Occupational Therapy

## 2018-10-03 ENCOUNTER — Inpatient Hospital Stay (HOSPITAL_COMMUNITY): Payer: Medicare PPO | Admitting: Speech Pathology

## 2018-10-03 LAB — CBC
HCT: 24.3 % — ABNORMAL LOW (ref 36.0–46.0)
Hemoglobin: 7.4 g/dL — ABNORMAL LOW (ref 12.0–15.0)
MCH: 30.2 pg (ref 26.0–34.0)
MCHC: 30.5 g/dL (ref 30.0–36.0)
MCV: 99.2 fL (ref 80.0–100.0)
Platelets: 161 10*3/uL (ref 150–400)
RBC: 2.45 MIL/uL — ABNORMAL LOW (ref 3.87–5.11)
RDW: 18.6 % — ABNORMAL HIGH (ref 11.5–15.5)
WBC: 5.5 10*3/uL (ref 4.0–10.5)
nRBC: 0 % (ref 0.0–0.2)

## 2018-10-03 LAB — BASIC METABOLIC PANEL
Anion gap: 12 (ref 5–15)
BUN: 40 mg/dL — ABNORMAL HIGH (ref 8–23)
CO2: 32 mmol/L (ref 22–32)
Calcium: 9.4 mg/dL (ref 8.9–10.3)
Chloride: 97 mmol/L — ABNORMAL LOW (ref 98–111)
Creatinine, Ser: 2 mg/dL — ABNORMAL HIGH (ref 0.44–1.00)
GFR calc Af Amer: 26 mL/min — ABNORMAL LOW (ref >60)
GFR calc non Af Amer: 22 mL/min — ABNORMAL LOW (ref >60)
Glucose, Bld: 152 mg/dL — ABNORMAL HIGH (ref 70–99)
Potassium: 4.8 mmol/L (ref 3.5–5.1)
Sodium: 141 mmol/L (ref 135–145)

## 2018-10-03 MED ORDER — MEGESTROL ACETATE 40 MG PO TABS
40.0000 mg | ORAL_TABLET | Freq: Every day | ORAL | Status: DC
  Filled 2018-10-03: qty 1

## 2018-10-03 MED ORDER — MEGESTROL ACETATE 400 MG/10ML PO SUSP
400.0000 mg | Freq: Every day | ORAL | Status: DC
  Administered 2018-10-03 – 2018-10-10 (×8): 400 mg via ORAL
  Filled 2018-10-03 (×8): qty 10

## 2018-10-03 NOTE — Progress Notes (Addendum)
Physical Therapy Session Note  Patient Details  Name: Maureen Stout MRN: [id number] Date of Birth: [birth year]  Today's Date: 10/03/2018 PT Individual Time: 4270-6237 PT Individual Time Calculation (min): 60 min   Short Term Goals: Week 2:  PT Short Term Goal 1 (Week 2): pt will consistently perform transfers at min A PT Short Term Goal 2 (Week 2): pt will perform gait wtih min A x 15' in controlled environment  Skilled Therapeutic Interventions/Progress Updates:     Patient in bed upon PT arrival. Patient alert and agreeable to PT session. Patient stated that she needed to void at beginning of session.  Therapeutic Activity: Bed Mobility: Patient performed supine to/from sit with mod A with HOB elevated. Provided verbal cues for bringing LEs off the bed and pushing to sit up. Patient sat EOB with CGA-supervision for safety x2 minutes. Transfers: Patient performed stand pivot transfers using the RW to/from the [facility] with mod-min A. Provided verbal cues for leaning forward to stand, management of RW, and erect posture in standing. She required min A-CGA for trunk support while toileting and total A for peri-care and LB dressing. She reported feeling dizzy and light headed in standing, BP following toileting 142/65 and HR 80. She performed a sit to/from stand using the RW x1 with mod A and performed standing balance for 2 minutes before stating she needed to sit down, BP 139/46 in standing and HR 80. Patient demonstrated signs of apraxia with transfers, requiring heavy verbal cues with hand-overhand assistance for placing hands on RW with all transfers and manual facilitation for pivoting.   Patient in recliner in room at end of session with breaks locked, seat belt alarm set, and all needs within reach.    Therapy Documentation Precautions:  Precautions Precautions: Fall Restrictions Weight Bearing Restrictions: No Pain:  Patient denied pain throughout session.    Therapy/Group:  Individual Therapy  Rickey Farrier L Amarie Tarte PT, DPT  10/03/2018, 3:43 PM

## 2018-10-03 NOTE — Progress Notes (Signed)
Speech Language Pathology Daily Session Note  Patient Details  Name: Maureen Stout MRN: [id number] Date of Birth: Sep 09, 1931  Today's Date: 10/03/2018 SLP Individual Time: 4580-9983 SLP Individual Time Calculation (min): 25 min  Short Term Goals: Week 2: SLP Short Term Goal 1 (Week 2): Patient will demonstrate sustained attention to functional tasks for 30 minutes with Min A verbal cues for redirection SLP Short Term Goal 2 (Week 2): Patient will demonstrate functional problem solving for basic and familiar tasks with Min A verbal cues.  SLP Short Term Goal 3 (Week 2): Patient will utilize external aids to recall new, daily information with Mod A multimodal cues.  SLP Short Term Goal 4 (Week 2): Patient wil follow multi-step commands with Mod A verbal and visual cues.   Skilled Therapeutic Interventions: Skilled treatment session focused on cognitive goals. SLP facilitated session by providing Max A verbal cues for problem solving while donning glasses. When glasses were donned, patient stated she could not see but had her eyes closed. When cued to open them, she reported, that's better. Patient participated in a simple sorting task from a field of 4 with total A needed for problem solving and initiation. Patient reported she could not complete the task because it was "making her sick." When asked to elaborate, she reported, "I'm dizzy." RN aware. Patient unable to participate effectively in remainder of session due to dizziness and nausea, therefore, patient missed remaining 20 minutes of session. Patient left upright in bed with alarm on and all needs within reach. Continue with current plan of care.      Pain No/Denies Pain   Therapy/Group: Individual Therapy  Eliezer Khawaja 10/03/2018, 8:48 AM

## 2018-10-03 NOTE — Progress Notes (Addendum)
Progress Note  Primary Cardiologist: Shirlee More, MD  Subjective   Very weak, no palpitations, SOB w/ minimal activity. Not aware of any blood loss  Inpatient Medications    Scheduled Meds: . amiodarone  100 mg Oral Daily  . amoxicillin  500 mg Oral QHS  . apixaban  2.5 mg Oral BID  . atorvastatin  10 mg Oral Daily  . calcium-vitamin D  1 tablet Oral BID  . feeding supplement (ENSURE ENLIVE)  237 mL Oral TID BM  . feeding supplement (PRO-STAT SUGAR FREE 64)  30 mL Oral BID  . furosemide  40 mg Oral BID  . Gerhardt's butt cream   Topical BID  . hydrALAZINE  25 mg Oral TID  . isosorbide dinitrate  5 mg Oral TID  . levothyroxine  75 mcg Oral Q0600  . mouth rinse  15 mL Mouth Rinse BID  . megestrol  400 mg Oral Daily  . polyethylene glycol  17 g Oral Daily  . potassium chloride  10 mEq Oral Daily  . sodium chloride flush  10-40 mL Intracatheter Q12H   Continuous Infusions:  PRN Meds: acetaminophen, alum & mag hydroxide-simeth, bisacodyl, calcium carbonate, diphenhydrAMINE, guaiFENesin-dextromethorphan, ondansetron (ZOFRAN) IV **OR** ondansetron, polyethylene glycol, prochlorperazine **OR** prochlorperazine **OR** prochlorperazine, senna-docusate, sodium chloride flush, sodium phosphate   Vital Signs    Vitals:   10/02/18 1329 10/02/18 2006 10/03/18 0520 10/03/18 0845  BP: (!) 141/71 (!) 113/57 (!) 160/79 (!) 158/80  Pulse: 76 77 88   Resp: 12 17 20    Temp: 97.6 F (36.4 C) 97.8 F (36.6 C) 97.7 F (36.5 C)   TempSrc:   Oral   SpO2: 99% 97% 96%   Weight:   78.9 kg   Height:        Intake/Output Summary (Last 24 hours) at 10/03/2018 1008 Last data filed at 10/03/2018 0846 Gross per 24 hour  Intake 603 ml  Output -  Net 603 ml   Filed Weights   10/01/18 0627 10/02/18 0422 10/03/18 0520  Weight: 79.5 kg 79.6 kg 78.9 kg     Physical Exam   General: Well developed, frail, elderly female in no acute distress Head: Eyes PERRLA, No xanthomas.   Normocephalic  and atraumatic Lungs: rales bases bilaterally to auscultation. Heart: HRRR S1 S2, without MRG.  Pulses are 2+ & equal. JVD 10 cm Abdomen: Bowel sounds are present, abdomen soft and non-tender without masses or  hernias noted. Msk: Weak strength and tone for age. Extremities: No clubbing, cyanosis or edema.    Skin:  No rashes or lesions noted. Multiple areas are bandaged, not disturbed Neuro: Alert and oriented X 2. Psych:  Kermit Balo affect, responds appropriately   Labs    Chemistry Recent Labs  Lab 09/29/18 0433 09/30/18 0336 10/01/18 0414  NA 140 139 140  K 4.9 4.3 4.1  CL 104 104 107  CO2 22 25 25   GLUCOSE 90 106* 92  BUN 31* 29* 30*  CREATININE 1.56* 1.58* 1.37*  CALCIUM 9.5 9.1 8.1*  PROT  --  5.5*  --   ALBUMIN  --  2.6*  --   AST  --  46*  --   ALT  --  53*  --   ALKPHOS  --  142*  --   BILITOT  --  0.5  --   GFRNONAA 30* 30* 35*  GFRAA 35* 34* 41*  ANIONGAP 14 10 8      Hematology Recent Labs  Lab 09/26/18 1040 10/03/18 0335  WBC 5.0 5.5  RBC 3.00* 2.45*  HGB 9.0* 7.4*  HCT 28.7* 24.3*  MCV 95.7 99.2  MCH 30.0 30.2  MCHC 31.4 30.5  RDW 18.6* 18.6*  PLT 185 161    Cardiac EnzymesNo results for input(s): TROPONINI in the last 168 hours. No results for input(s): TROPIPOC in the last 168 hours.      Patient Profile:   Maureen Stout is a 85 y.o. female with a hx of paroxysmal atrial fibrillation on sinus bradycardia beta-blocker previously held, CKD stage III,, dyslipidemia, hypertension, recent urosepsis and pancreatitis now in CIR who is being seen today for the evaluation of new onset systolic heart failure at the request of Dr. Posey Pronto.  Assessment & Plan    1.  Acute combined systolic and diastolic CHF (EF 73%) with severe pulmonary hypertension - got Lasix 40 mg IV on 05/14 & 05/15, now on Lasix 40 mg po bid - still w/ volume overload by exam - on the po Lasix, her weight has been trending down, so believe she is losing fluid - I/O are incomplete  - renal function is stable - MD advise if more IV Lasix needed - on low-dose hydralazine/nitrates  2 afib - has been maintaining SR - on Eliquis and amio  3. HTN - on Hydralazine 25 mg tid, isordil 5 mg tid plus Lasix - HR 70s, but hx sinus brady so not on BB - discuss w/ MD  4. Stage III renal failure -renal function stable on current Lasix dose  5. Anemia - was transfused 1 u PRBCs on 04/28 - Hgb 7s and low 8s except for 9.0 on 05/11 - MCV is normal -  per Attending  Has CBC and BMET ordered once a week, on Mondays. Otherwise, per Dr Posey Pronto Active Problems:   Debility   Acute pancreatitis   Anasarca   Anemia of chronic disease   AKI (acute kidney injury) ([place])   Stage 3 chronic kidney disease ([place])   Urinary retention   Encephalopathy   Failure to thrive (child)   Recurrent UTI   CKD (chronic kidney disease), stage III (HCC)   Orthostasis   Hypokalemia   Feeding by G-tube ([place])   At risk for dehydration due to poor fluid intake   Dysphagia   Edema   Esophageal dysmotility   Cardiomegaly   Acute combined systolic and diastolic congestive heart failure ([place])   Encounter for feeding tube placement   Rosaria Ferries, PA-C 10/03/2018 10:10 AM Beeper [phone]  I have seen and examined the patient along with Rosaria Ferries, PA-C, PA NP.  I have reviewed the chart, notes and new data.  I agree with PA/NP's note.  Key new complaints: no CV complaints, denies dyspnea. Complains of severe pain in both feet. Key examination changes: JVD 9-10 cm with prominent V waves, very little peripheral edema Key new findings / data: creat stable, maybe even a slight improvement with diuresis  PLAN: Continue diuretics. Exam is not typical for gout, but it is conceivable that this is causing her foot pain, especially while diuresing. Check uric acid. Avoid IV diuretics if we can.  Sanda Klein, MD, [facility] [phone] 10/03/2018, 2:21 PM  Addendum - late draw  BMET shows creatinine suddenly increased to 2.00. Switched to PO diuretics. Recheck in AM.

## 2018-10-03 NOTE — Progress Notes (Signed)
Occupational Therapy Session Note  Patient Details  Name: Maureen Stout MRN: [id number] Date of Birth: Jun 29, 1931  Today's Date: 10/03/2018 OT Individual Time: 1300-1313 OT Individual Time Calculation (min): 13 min  and Today's Date: 10/03/2018 OT Missed Time: 17 Minutes Missed Time Reason: Patient ill (comment);Patient fatigue   Short Term Goals: Week 2:  OT Short Term Goal 1 (Week 2): Pt will complete bathing at sit > stand level with min assist OT Short Term Goal 2 (Week 2): Pt will complete toilet transfers with min assist  OT Short Term Goal 3 (Week 2): Pt will complete LB dressing with min assist  Skilled Therapeutic Interventions/Progress Updates:    Upon entering the room, pt seated in recliner chair and reports, " I just don't feel well at all." Pt reporting pain in B feet and RN notified. OT repositioning pt for comfort and pt closing eyes to rest after washing face with set up A. Call bell and all other needs placed within reach upon exiting the room.   Therapy Documentation Precautions:  Precautions Precautions: Fall Restrictions Weight Bearing Restrictions: No General: General OT Amount of Missed Time: 17 Minutes   Therapy/Group: Individual Therapy  Gypsy Decant 10/03/2018, 1:15 PM

## 2018-10-03 NOTE — Progress Notes (Signed)
[place] PHYSICAL MEDICINE & REHABILITATION PROGRESS NOTE  Subjective/Complaints: Patient seen laying in bed this morning.  She states she did not sleep well overnight.  She states she wants to go better and asks me to pray for her.  She was seen over the weekend by cardiology and medications adjusted.  Appreciate recs.  ROS: Denies CP, shortness of breath, nausea, vomiting, diarrhea.   Objective: Vital Signs: Blood pressure (!) 160/79, pulse 88, temperature 97.7 F (36.5 C), temperature source Oral, resp. rate 20, height 5\' 6"  (1.676 m), weight 78.9 kg, SpO2 96 %. No results found. Recent Labs    10/03/18 0335  WBC 5.5  HGB 7.4*  HCT 24.3*  PLT 161   Recent Labs    10/01/18 0414  NA 140  K 4.1  CL 107  CO2 25  GLUCOSE 92  BUN 30*  CREATININE 1.37*  CALCIUM 8.1*    Physical Exam: BP (!) 160/79 (BP Location: Left Arm)   Pulse 88   Temp 97.7 F (36.5 C) (Oral)   Resp 20   Ht 5\' 6"  (1.676 m)   Wt 78.9 kg   SpO2 96%   BMI 28.08 kg/m  Constitutional: No distress . Vital signs reviewed. HENT: Normocephalic.  Atraumatic. Eyes: EOMI. No discharge. Cardiovascular: No JVD. Respiratory: Normal effort.  + Gilbert. GI: Non-distended. Musc: Generalized edema, improving Neurological: Alert and oriented x 2. Motor: Grossly 4-/5 throughout, improving Skin: Frail skin. Psychiatric: Flat  Assessment/Plan: 1. Functional deficits secondary to debility and encephalopathy which require 3+ hours per day of interdisciplinary therapy in a comprehensive inpatient rehab setting.  Physiatrist is providing close team supervision and 24 hour management of active medical problems listed below.  Physiatrist and rehab team continue to assess barriers to discharge/monitor patient progress toward functional and medical goals  Care Tool:  Bathing  Bathing activity did not occur: Refused Body parts bathed by patient: Right arm, Left arm, Chest, Abdomen, Face   Body parts bathed by  helper: Front perineal area, Buttocks, Right upper leg, Left upper leg, Right lower leg, Left lower leg     Bathing assist Assist Level: Maximal Assistance - Patient 24 - 49%     Upper Body Dressing/Undressing Upper body dressing Upper body dressing/undressing activity did not occur (including orthotics): Refused What is the patient wearing?: Pull over shirt    Upper body assist Assist Level: Moderate Assistance - Patient 50 - 74%    Lower Body Dressing/Undressing Lower body dressing    Lower body dressing activity did not occur: Refused What is the patient wearing?: Pants, Incontinence brief     Lower body assist Assist for lower body dressing: Total Assistance - Patient < 25%     Toileting Toileting Toileting Activity did not occur Landscape architect and hygiene only): Safety/medical concerns  Toileting assist Assist for toileting: Total Assistance - Patient < 25%     Transfers Chair/bed transfer  Transfers assist     Chair/bed transfer assist level: Moderate Assistance - Patient 50 - 74%     Locomotion Ambulation   Ambulation assist      Assist level: Moderate Assistance - Patient 50 - 74% Assistive device: Walker-rolling Max distance: 2   Walk 10 feet activity   Assist  Walk 10 feet activity did not occur: Safety/medical concerns  Assist level: Minimal Assistance - Patient > 75% Assistive device: Walker-rolling   Walk 50 feet activity   Assist Walk 50 feet with 2 turns activity did not occur: Safety/medical concerns  Walk 150 feet activity   Assist Walk 150 feet activity did not occur: Safety/medical concerns         Walk 10 feet on uneven surface  activity   Assist Walk 10 feet on uneven surfaces activity did not occur: Safety/medical concerns         Wheelchair     Assist Will patient use wheelchair at discharge?: No Type of Wheelchair: Manual    Wheelchair assist level: Maximal Assistance - Patient 25 -  49% Max wheelchair distance: 10    Wheelchair 50 feet with 2 turns activity    Assist            Wheelchair 150 feet activity     Assist            Medical Problem List and Plan: 1.Functional deficits and weaknesssecondary to debility and encephalopathy after urosepsis/pancreatitis and multiple medical issues  Cont CIR, 15/7  Thyroid function within normal limits  Weekly notes reviewed 2. Antithrombotics: -PAF/DVT/anticoagulation:Pharmaceutical:Other (comment)- Eliquis 3. Pain Management:Tylenol prn. 4. Mood:LCSW to follow for evaluation and support. -antipsychotic agents: N/A 5. Neuropsych: This patientisnot fully capable of making decisions on herown behalf. 6. Skin/Wound Care:Routine pressure relief measures. 7. Fluids/Electrolytes/Nutrition:Strict I/O.   PO intake limited, Megace initiated  IVF d/ced  Barium swallow showing mild esophageal dysmotility 8. Enterococcus bacteremia/UTI: Completed 14-day course of Maxipime   Amoxicillin 500 mg nightly started on 5/7 9. Anasarca/FLuid overload: Added heart healthy restrictions. Weight daily. Lasix daily prn depending stability of weights/symptoms.  Filed Weights   10/01/18 0627 10/02/18 0422 10/03/18 0520  Weight: 79.5 kg 79.6 kg 78.9 kg   Stable on 5/18 10. PAF: Monitor HR bid--back on amiodarone and Eliquis. Off metoprolol due to bradycardia. 11. Urinary retention with recurrent UTIs:   Bladder prolapse  Amoxicillin 500 mg nightly started on 5/7 per IM  Repeat urine culture with yeast-Diflucan x1 completed  Cath for volumes > 350 cc. 12. Acute on Chronic renal failure: SCr- 1.32 on 04/2018. Continue to monitor with routine checks.   Creatinine at 1.37 on 5/16, labs pending 13. Delirium: Resolving.  14. Anemia of chronic disease: Baseline Hg around 9.0? Likely dropafterhydration.   Monitor for signs of bleeding.   Hemoglobin 7.4 on 5/18  Continue to monitor 15. Acute  pancreatitis: Offer supplements with meals.   LFTs elevated, but improving on 5/15  Amylase/lipase within normal limits  Ammonia WNL  Continue to monitor 16. Hypothyroidism: Now synthroid 75 mcg/day. 72.  Hypertension  Chest x-ray reviewed, showing cardiomegaly.  BNP elevated.  See #20  Cards adjusting medications, labile on 5/18 18.  Hypokalemia  Potassium 4.1 on 5/16, labs pending  Supplement increased on 5/7, decreased on 5/12, decreased again on 5/13  IV supplement ordered on 5/8 19. Hypotheramia: Resolved  Did not require bear hugger last night, cont to monitor 20. CHF  CXR showing cardiomegaly  BNP elevated  Echo 2018 reviewed, showing EF 60-65%, repeat Echo showing significant decrease in ejection fraction of 81-19% with diastolic heart failure as well.   Not a candidate for additional procedures at this time, medications being adjusted by cards-appreciate recs  ECG reviewed, stable 21. Esophageal Dysmotility  Per barium swallow  Per GI monitor now that NG tube has been removed.  LOS: 14 days A FACE TO FACE EVALUATION WAS PERFORMED  Chanika Byland [name] 10/03/2018, 8:42 AM

## 2018-10-04 ENCOUNTER — Inpatient Hospital Stay (HOSPITAL_COMMUNITY): Payer: Medicare PPO | Admitting: Speech Pathology

## 2018-10-04 ENCOUNTER — Inpatient Hospital Stay (HOSPITAL_COMMUNITY): Payer: Medicare PPO | Admitting: Occupational Therapy

## 2018-10-04 ENCOUNTER — Inpatient Hospital Stay (HOSPITAL_COMMUNITY): Payer: Medicare PPO | Admitting: Physical Therapy

## 2018-10-04 LAB — BASIC METABOLIC PANEL
Anion gap: 11 (ref 5–15)
BUN: 39 mg/dL — ABNORMAL HIGH (ref 8–23)
CO2: 33 mmol/L — ABNORMAL HIGH (ref 22–32)
Calcium: 9.3 mg/dL (ref 8.9–10.3)
Chloride: 96 mmol/L — ABNORMAL LOW (ref 98–111)
Creatinine, Ser: 1.81 mg/dL — ABNORMAL HIGH (ref 0.44–1.00)
GFR calc Af Amer: 29 mL/min — ABNORMAL LOW (ref >60)
GFR calc non Af Amer: 25 mL/min — ABNORMAL LOW (ref >60)
Glucose, Bld: 105 mg/dL — ABNORMAL HIGH (ref 70–99)
Potassium: 4.7 mmol/L (ref 3.5–5.1)
Sodium: 140 mmol/L (ref 135–145)

## 2018-10-04 LAB — URIC ACID: Uric Acid, Serum: 5 mg/dL (ref 2.5–7.1)

## 2018-10-04 MED ORDER — ISOSORBIDE DINITRATE 10 MG PO TABS
10.0000 mg | ORAL_TABLET | Freq: Three times a day (TID) | ORAL | Status: DC
  Administered 2018-10-04 – 2018-10-09 (×12): 10 mg via ORAL
  Filled 2018-10-04 (×21): qty 1

## 2018-10-04 MED ORDER — HYDRALAZINE HCL 25 MG PO TABS
37.5000 mg | ORAL_TABLET | Freq: Three times a day (TID) | ORAL | Status: DC
  Administered 2018-10-04 – 2018-10-07 (×7): 37.5 mg via ORAL
  Filled 2018-10-04 (×11): qty 2

## 2018-10-04 NOTE — Progress Notes (Signed)
Occupational Therapy Session Note  Patient Details  Name: Maureen Stout MRN: [id number] Date of Birth: Jun 18, 1931  Today's Date: 10/04/2018 OT Individual Time: 1330-1355 OT Individual Time Calculation (min): 25 min    Short Term Goals: Week 2:  OT Short Term Goal 1 (Week 2): Pt will complete bathing at sit > stand level with min assist OT Short Term Goal 2 (Week 2): Pt will complete toilet transfers with min assist  OT Short Term Goal 3 (Week 2): Pt will complete LB dressing with min assist  Skilled Therapeutic Interventions/Progress Updates:    Upon entering the room, pt seated in recliner chair with no c/o pain. OT checking skin integrity of ears and nose where oxygen cord is lying and it appears to be intact. Pt verbalized being unsure if she was wet or not. Pt declines toileting this session. Pt standing from recliner chair with mod lifting assistance and needs total A for clothing management. Pt's brief is soiled and OT provides total A for hygiene and to pull pants back over hips. Pt standing with mod - max A for standing balance and posterior bias. Pt returning to sit in recliner chair and OT assists with repositioning for comfort and safety. Chair alarm belt donned with call bell and all needed items within reach. Pt's is asleep as therapist is exiting the room.  Therapy Documentation Precautions:  Precautions Precautions: Fall Restrictions Weight Bearing Restrictions: No Vital Signs: Therapy Vitals Temp: 98.6 F (37 C) Temp Source: Oral Pulse Rate: 74 Resp: 17 BP: (!) 107/52 Patient Position (if appropriate): Sitting Oxygen Therapy SpO2: 100 % O2 Device: Nasal Cannula O2 Flow Rate (L/min): 2 L/min   Therapy/Group: Individual Therapy  Gypsy Decant 10/04/2018, 3:30 PM

## 2018-10-04 NOTE — Progress Notes (Signed)
Progress Note  Patient Name: Maureen Stout Date of Encounter: 10/04/2018  Primary Cardiologist: Shirlee More, MD   Subjective   Slept well, no orthopnea. Creatinine peaked at 2.0 yesterday and a little lower at 1.8 today.  Inpatient Medications    Scheduled Meds: . amiodarone  100 mg Oral Daily  . amoxicillin  500 mg Oral QHS  . apixaban  2.5 mg Oral BID  . atorvastatin  10 mg Oral Daily  . calcium-vitamin D  1 tablet Oral BID  . feeding supplement (ENSURE ENLIVE)  237 mL Oral TID BM  . feeding supplement (PRO-STAT SUGAR FREE 64)  30 mL Oral BID  . furosemide  40 mg Oral BID  . Gerhardt's butt cream   Topical BID  . hydrALAZINE  25 mg Oral TID  . isosorbide dinitrate  5 mg Oral TID  . levothyroxine  75 mcg Oral Q0600  . mouth rinse  15 mL Mouth Rinse BID  . megestrol  400 mg Oral Daily  . polyethylene glycol  17 g Oral Daily  . potassium chloride  10 mEq Oral Daily  . sodium chloride flush  10-40 mL Intracatheter Q12H   Continuous Infusions:  PRN Meds: acetaminophen, alum & mag hydroxide-simeth, bisacodyl, calcium carbonate, diphenhydrAMINE, guaiFENesin-dextromethorphan, ondansetron (ZOFRAN) IV **OR** ondansetron, polyethylene glycol, prochlorperazine **OR** prochlorperazine **OR** prochlorperazine, senna-docusate, sodium chloride flush, sodium phosphate   Vital Signs    Vitals:   10/03/18 1942 10/04/18 0430 10/04/18 0559 10/04/18 0826  BP: (!) 148/74 (!) 157/71  (!) 154/72  Pulse: 78 79    Resp: 18 18    Temp: 97.7 F (36.5 C) 98 F (36.7 C)    TempSrc: Oral Oral    SpO2: 99% 97%    Weight:   76.4 kg   Height:        Intake/Output Summary (Last 24 hours) at 10/04/2018 1303 Last data filed at 10/04/2018 1251 Gross per 24 hour  Intake 450 ml  Output -  Net 450 ml   Last 3 Weights 10/04/2018 10/03/2018 10/02/2018  Weight (lbs) 168 lb 6.9 oz 173 lb 15.1 oz 175 lb 7.8 oz  Weight (kg) 76.4 kg 78.9 kg 79.6 kg      Telemetry    n/a - Personally Reviewed   ECG    No new tracing - Personally Reviewed  Physical Exam  Comfortable, appears frail GEN: No acute distress.   Neck: Mild  JVD 5 cm Cardiac: RRR, no murmurs, rubs, or gallops.  Respiratory: Clear to auscultation bilaterally. GI: Soft, nontender, non-distended  MS: No edema; No deformity. Neuro:  Nonfocal  Psych: Normal affect   Labs    Chemistry Recent Labs  Lab 09/30/18 0336 10/01/18 0414 10/03/18 1047 10/04/18 0408  NA 139 140 141 140  K 4.3 4.1 4.8 4.7  CL 104 107 97* 96*  CO2 25 25 32 33*  GLUCOSE 106* 92 152* 105*  BUN 29* 30* 40* 39*  CREATININE 1.58* 1.37* 2.00* 1.81*  CALCIUM 9.1 8.1* 9.4 9.3  PROT 5.5*  --   --   --   ALBUMIN 2.6*  --   --   --   AST 46*  --   --   --   ALT 53*  --   --   --   ALKPHOS 142*  --   --   --   BILITOT 0.5  --   --   --   GFRNONAA 30* 35* 22* 25*  GFRAA 34* 41* 26*  29*  ANIONGAP 10 8 12 11      Hematology Recent Labs  Lab 10/03/18 0335  WBC 5.5  RBC 2.45*  HGB 7.4*  HCT 24.3*  MCV 99.2  MCH 30.2  MCHC 30.5  RDW 18.6*  PLT 161    Cardiac EnzymesNo results for input(s): TROPONINI in the last 168 hours. No results for input(s): TROPIPOC in the last 168 hours.   BNP Recent Labs  Lab 09/28/18 1117  BNP 241.8*     DDimer No results for input(s): DDIMER in the last 168 hours.   Radiology    No results found.  Cardiac Studies   2D Echo 09/29/18 IMPRESSIONS   1. The left ventricle has moderate-severely reduced systolic function, with an ejection fraction of 30-35%. The cavity size was mildly dilated. Left ventricular diastolic Doppler parameters are consistent with pseudonormalization. Elevated left atrial  and left ventricular end-diastolic pressures Left ventricular diffuse hypokinesis. 2. The right ventricle has mildly reduced systolic function. The cavity was mildly enlarged. There is no increase in right ventricular wall thickness. Right ventricular systolic pressure is severely elevated with an  estimated pressure of 56.6 mmHg. 3. Left atrial size was mildly dilated. 4. Right atrial size was mildly dilated. 5. Large pleural effusion in the left lateral region. 6. Trivial pericardial effusion is present. 7. Tricuspid valve regurgitation is mild-moderate. 8. The aortic valve is tricuspid. Mild thickening of the aortic valve. Mild calcification of the aortic valve. Aortic valve regurgitation is mild by color flow Doppler. No stenosis of the aortic valve.  Patient Profile     85 y.o. female with a hx of paroxysmal atrial fibrillation, h/o sinus bradycardia beta-blocker previously held, CKD stage III, dyslipidemia, hypertension, recent urosepsis and pancreatitis now in CIRwho is being seen today for the evaluation of new onset systolic heart failureat the request of Dr. Posey Pronto.  Assessment & Plan    1. CHF: LVEF 30% and moderate-to-severe PAH. Probably at "dry weight". Some signs of R heart failure may persist due to [facility]. Transitioned to PO diuretics. Increase hydralazine/nitrates closer to a "Bidil" dose. 2. AKI:  Due to diuretics. Recheck in AM.Creatinine today still elevated, but improving. 3. PAH: at least partly due to L heart failure. 4. Parox AFib: in regular rhythm today, on Amiodarone and Eliquis. 5. Anemia: no overt bleeding.    Hgb 7-9 range during this hospital admission.  For questions or updates, please contact [facility] Please consult [email or web address] for contact info under        Signed, Sanda Klein, MD  10/04/2018, 1:03 PM

## 2018-10-04 NOTE — Progress Notes (Signed)
Patient slept 3.5 hours. Has been awake since 1 a.m. trying to get up, asking "is it time to go to work?" When entering the room I reorient her and she will lay back down. 2 person assist for stand and pivot, weakness in her lower extremities. Patient is currently resting with eyes closed.

## 2018-10-04 NOTE — Plan of Care (Signed)
  Problem: RH BOWEL ELIMINATION Goal: RH STG MANAGE BOWEL W/MEDICATION W/ASSISTANCE Description STG Manage Bowel with Medication with mod.Assistance.  Outcome: Progressing   Problem: RH BLADDER ELIMINATION Goal: RH STG MANAGE BLADDER WITH ASSISTANCE Description STG Manage Bladder With Min Assistance  Outcome: Progressing Goal: RH STG MANAGE BLADDER WITH EQUIPMENT WITH ASSISTANCE Description STG Manage Bladder With Equipment With Mod Assistance  Outcome: Progressing   Problem: RH SKIN INTEGRITY Goal: RH STG SKIN FREE OF INFECTION/BREAKDOWN Description With mod. assist  Outcome: Progressing Goal: RH STG MAINTAIN SKIN INTEGRITY WITH ASSISTANCE Description STG Maintain Skin Integrity With Mod.Assistance.  Outcome: Progressing Goal: RH STG ABLE TO PERFORM INCISION/WOUND CARE W/ASSISTANCE Description Family will Able To Perform Incision/Wound Care With [employer].   Outcome: Progressing   Problem: RH SAFETY Goal: RH STG ADHERE TO SAFETY PRECAUTIONS W/ASSISTANCE/DEVICE Description STG Adhere to Safety Precautions With Min Assistance/Device.  Outcome: Progressing   Problem: RH PAIN MANAGEMENT Goal: RH STG PAIN MANAGED AT OR BELOW PT'S PAIN GOAL Description Patient will be pain free or pain less than 3 during admission  Outcome: Progressing   Problem: RH KNOWLEDGE DEFICIT GENERAL Goal: RH STG INCREASE KNOWLEDGE OF SELF CARE AFTER HOSPITALIZATION Outcome: Progressing

## 2018-10-04 NOTE — Progress Notes (Signed)
Physical Therapy Session Note  Patient Details  Name: Maureen Stout MRN: [id number] Date of Birth: Dec 05, 1931  Today's Date: 10/04/2018 PT Individual Time: 0915-1014 PT Individual Time Calculation (min): 59 min   Short Term Goals: Week 2:  PT Short Term Goal 1 (Week 2): pt will consistently perform transfers at min A PT Short Term Goal 2 (Week 2): pt will perform gait wtih min A x 15' in controlled environment  Skilled Therapeutic Interventions/Progress Updates:      Therapy Documentation Precautions:  Precautions Precautions: Fall Restrictions Weight Bearing Restrictions: No  Treatment: Pt asleep in recliner on arrival to room. Awoke easily and agreed to PT at this time. No other complaints when asked, denied pain.  BP before session began was 122/59 with feet elevated in recliner. Provided max to total assist to don pants and shirt seated in chair. Mod assist to stand from chair to recliner. Pt unable to let go of RW to assist with pulling up pants, needed min to mod assist for standing balance at RW for up to 2 minutes. BP checked with reading of 116/68. Pt needing to sit after ~2 minute mark.  Seated in chair with feet on floor engaged pt in LE exercises- heel/toe raises, long arc quads and marching with max multimodal cues needed on form and to task. BP checked after exercises with reading of 112/49. Elevated pt's feet and worked on the following exercises ankle pums, quad sets, straight leg raises for 10 reps each bil LE's with cues to task and active assist needed. BP rechecked at reading 119/55. Placed feet back on floor- worked on unsupported sitting with ball toss and catch for ~5 minutes, then had pt hold ball and perform UE raises. Pt needed min assist for unsupported balance with ball raises. Performed additional set of LE ex's as follows- long arc quads and manually resisted hamstring curls for 10 reps each. Performed 2cd sit<>stand to RW with mod assist, cues on technique/weight  shifting. Pt able to stand for up to 3 minutes this time with min to mod assist for balance, cues on posture, working on lateral weight shifting once upright posture was achieved. Pt returned to recliner with feel up and belt alarm in place. BP 124/56. Pt left with all needs in reach.    Therapy/Group: Individual Therapy  Lindon Romp, PTA, CLT 10/04/18, 5:50 PM 10/04/2018, 5:50 PM

## 2018-10-04 NOTE — Progress Notes (Signed)
Speech Language Pathology Weekly Progress and Session Note  Patient Details  Name: Maureen Stout MRN: [id number] Date of Birth: 05/14/1932  Beginning of progress report period: Sep 27, 2018 End of progress report period: Oct 04, 2018  Today's Date: 10/04/2018 SLP Individual Time: 1130-1155 SLP Individual Time Calculation (min): 25 min  Short Term Goals: Week 2: SLP Short Term Goal 1 (Week 2): Patient will demonstrate sustained attention to functional tasks for 30 minutes with Min A verbal cues for redirection SLP Short Term Goal 1 - Progress (Week 2): Not met SLP Short Term Goal 2 (Week 2): Patient will demonstrate functional problem solving for basic and familiar tasks with Min A verbal cues.  SLP Short Term Goal 2 - Progress (Week 2): Not met SLP Short Term Goal 3 (Week 2): Patient will utilize external aids to recall new, daily information with Mod A multimodal cues.  SLP Short Term Goal 3 - Progress (Week 2): Not met SLP Short Term Goal 4 (Week 2): Patient wil follow multi-step commands with Mod A verbal and visual cues.  SLP Short Term Goal 4 - Progress (Week 2): Met    New Short Term Goals: Week 3: SLP Short Term Goal 1 (Week 3): Patient will demonstrate sustained attention to functional tasks for 30 minutes with Mod A verbal cues for redirection SLP Short Term Goal 2 (Week 3): Patient will demonstrate functional problem solving for basic and familiar tasks with Mod A verbal cues.  SLP Short Term Goal 3 (Week 3): Patient will utilize external aids to recall new, daily information with Mod A multimodal cues.  SLP Short Term Goal 4 (Week 3): Patient will identify 2 physical and 2 cognitive changes with Mod A verbal cues.   Weekly Progress Updates: Patient continues to make inconsistent gains and has met 1 of 4 STGs this reporting period. Currently, patient is more alert but fatigues quickly throughout session with participation also limited by reports of patient "not feeling well."  Patient is able to independently express her basic wants/needs and follows basic commands but requires extra time and overall Max A multimodal cues to complete functional and familiar tasks safely in regards to problem solving, attention, awareness and recall.  Patient and family education ongoing. Patient would benefit from continued skilled SLP intervention to maximize her cognitive functioning prior to discharge.       Intensity: Minumum of 1-2 x/day, 30 to 90 minutes Frequency: 3 to 5 out of 7 days Duration/Length of Stay: 10/10/18 Treatment/Interventions: Cognitive remediation/compensation;Environmental controls;Internal/external aids;Speech/Language facilitation;Therapeutic Activities;Patient/family education;Functional tasks;Cueing hierarchy   Daily Session  Skilled Therapeutic Interventions: Skilled treatment session focused on cognitive goals. Upon arrival, patient was awake while upright in the recliner. SLP facilitated session by providing Mod-Max A verbal cues for recall of her current medications and their functions. During task, patient requested to use the bathroom. Patient required Max A verbal cues for problem solving and safety with task with frequent reminders that she was on the commode. Although not aware of it, patient able to void a small amount. Patient left upright in the recliner with alarm on and all needs within reach. Continue with current plan of care.      Pain No/Denies Pain   Therapy/Group: Individual Therapy  Dejia Ebron 10/04/2018, 6:48 AM

## 2018-10-04 NOTE — Progress Notes (Signed)
[facility] PHYSICAL MEDICINE & REHABILITATION PROGRESS NOTE  Subjective/Complaints: Patient seen lying in bed this morning.  She states she slept well overnight.  She states she feels better.  She was seen by cards yesterday, notes reviewed.  ROS: Denies CP, shortness of breath, nausea, vomiting, diarrhea.   Objective: Vital Signs: Blood pressure (!) 154/72, pulse 79, temperature 98 F (36.7 C), temperature source Oral, resp. rate 18, height 5\' 6"  (1.676 m), weight 76.4 kg, SpO2 97 %. No results found. Recent Labs    10/03/18 0335  WBC 5.5  HGB 7.4*  HCT 24.3*  PLT 161   Recent Labs    10/03/18 1047 10/04/18 0408  NA 141 140  K 4.8 4.7  CL 97* 96*  CO2 32 33*  GLUCOSE 152* 105*  BUN 40* 39*  CREATININE 2.00* 1.81*  CALCIUM 9.4 9.3    Physical Exam: BP (!) 154/72   Pulse 79   Temp 98 F (36.7 C) (Oral)   Resp 18   Ht 5\' 6"  (1.676 m)   Wt 76.4 kg   SpO2 97%   BMI 27.19 kg/m  Constitutional: No distress . Vital signs reviewed. HENT: Normocephalic.  Atraumatic. Eyes: EOMI.  No discharge. Cardiovascular: No JVD. Respiratory: Normal effort.  + [place]. GI: Non-distended. Musc: Generalized edema, unchanged Neurological: Alert and oriented x 2. Motor: Grossly 4-/5 throughout, improving Skin: Frail skin. Psychiatric: Flat  Assessment/Plan: 1. Functional deficits secondary to debility and encephalopathy which require 3+ hours per day of interdisciplinary therapy in a comprehensive inpatient rehab setting.  Physiatrist is providing close team supervision and 24 hour management of active medical problems listed below.  Physiatrist and rehab team continue to assess barriers to discharge/monitor patient progress toward functional and medical goals  Care Tool:  Bathing  Bathing activity did not occur: Refused Body parts bathed by patient: Right arm, Left arm, Chest, Abdomen, Face   Body parts bathed by helper: Front perineal area, Buttocks, Right upper leg, Left  upper leg, Right lower leg, Left lower leg     Bathing assist Assist Level: Maximal Assistance - Patient 24 - 49%     Upper Body Dressing/Undressing Upper body dressing Upper body dressing/undressing activity did not occur (including orthotics): Refused What is the patient wearing?: Pull over shirt    Upper body assist Assist Level: Moderate Assistance - Patient 50 - 74%    Lower Body Dressing/Undressing Lower body dressing    Lower body dressing activity did not occur: Refused What is the patient wearing?: Pants, Incontinence brief     Lower body assist Assist for lower body dressing: Total Assistance - Patient < 25%     Toileting Toileting Toileting Activity did not occur Landscape architect and hygiene only): Safety/medical concerns  Toileting assist Assist for toileting: Total Assistance - Patient < 25%     Transfers Chair/bed transfer  Transfers assist     Chair/bed transfer assist level: Moderate Assistance - Patient 50 - 74%     Locomotion Ambulation   Ambulation assist      Assist level: Moderate Assistance - Patient 50 - 74% Assistive device: Walker-rolling Max distance: 2   Walk 10 feet activity   Assist  Walk 10 feet activity did not occur: Safety/medical concerns  Assist level: Minimal Assistance - Patient > 75% Assistive device: Walker-rolling   Walk 50 feet activity   Assist Walk 50 feet with 2 turns activity did not occur: Safety/medical concerns         Walk 150 feet  activity   Assist Walk 150 feet activity did not occur: Safety/medical concerns         Walk 10 feet on uneven surface  activity   Assist Walk 10 feet on uneven surfaces activity did not occur: Safety/medical concerns         Wheelchair     Assist Will patient use wheelchair at discharge?: No Type of Wheelchair: Manual    Wheelchair assist level: Maximal Assistance - Patient 25 - 49% Max wheelchair distance: 10    Wheelchair 50 feet with  2 turns activity    Assist            Wheelchair 150 feet activity     Assist            Medical Problem List and Plan: 1.Functional deficits and weaknesssecondary to debility and encephalopathy after urosepsis/pancreatitis and multiple medical issues  Cont CIR, 15/7  Thyroid function within normal limits 2. Antithrombotics: -PAF/DVT/anticoagulation:Pharmaceutical:Other (comment)- Eliquis 3. Pain Management:Tylenol prn. 4. Mood:LCSW to follow for evaluation and support. -antipsychotic agents: N/A 5. Neuropsych: This patientisnot fully capable of making decisions on herown behalf. 6. Skin/Wound Care:Routine pressure relief measures. 7. Fluids/Electrolytes/Nutrition:Strict I/O.   PO intake limited, slowly improving   Megace initiated  IVF d/ced  Barium swallow showing mild esophageal dysmotility 8. Enterococcus bacteremia/UTI: Completed 14-day course of Maxipime   Amoxicillin 500 mg nightly started on 5/7 9. Anasarca/FLuid overload: Added heart healthy restrictions. Weight daily. Lasix daily prn depending stability of weights/symptoms.  Filed Weights   10/02/18 0422 10/03/18 0520 10/04/18 0559  Weight: 79.6 kg 78.9 kg 76.4 kg   Improving on 5/19 10. PAF: Monitor HR bid--back on amiodarone and Eliquis. Off metoprolol due to bradycardia. 11. Urinary retention with recurrent UTIs:   Bladder prolapse  Amoxicillin 500 mg nightly started on 5/7 per IM  Repeat urine culture with yeast-Diflucan x1 completed  Cath for volumes > 350 cc. 12. Acute on Chronic renal failure: SCr- 1.32 on 04/2018. Continue to monitor with routine checks.   Creatinine at 1.81 on 5/19  Encourage fluids 13. Delirium: Resolving.  14. Anemia of chronic disease: Baseline Hg around 9.0? Likely dropafterhydration.   Monitor for signs of bleeding.   Hemoglobin 7.4 on 5/18  Continue to monitor 15. Acute pancreatitis: Offer supplements with meals.   LFTs elevated,  but improving on 5/15  Amylase/lipase within normal limits  Ammonia WNL  Continue to monitor 16. Hypothyroidism: Now synthroid 75 mcg/day. 52.  Hypertension  Chest x-ray reviewed, showing cardiomegaly.  BNP elevated.  See #20  Cards adjusting medications  Elevated on 5/19 18.  Hypokalemia  Potassium 4.7 on 5/19  Supplement increased on 5/7, decreased on 5/12, decreased again on 5/13  IV supplement ordered on 5/8 19. Hypotheramia: Resolved  Did not require bear hugger last night, cont to monitor 20. CHF  CXR showing cardiomegaly  BNP elevated  Echo 2018 reviewed, showing EF 60-65%, repeat Echo showing significant decrease in ejection fraction of 61-60% with diastolic heart failure as well.   Not a candidate for additional procedures at this time, medications being adjusted by cards-appreciate recs  ECG reviewed, stable 21. Esophageal Dysmotility  Per barium swallow  Per GI monitor now that NG tube has been removed. 22. Prediabtes  Labile blood glucose on 5/19  LOS: 15 days A FACE TO FACE EVALUATION WAS PERFORMED  Ike Maragh [name] 10/04/2018, 8:40 AM

## 2018-10-05 ENCOUNTER — Inpatient Hospital Stay (HOSPITAL_COMMUNITY): Payer: Medicare PPO

## 2018-10-05 ENCOUNTER — Inpatient Hospital Stay (HOSPITAL_COMMUNITY): Payer: Medicare PPO | Admitting: Physical Therapy

## 2018-10-05 ENCOUNTER — Ambulatory Visit: Payer: Medicare PPO | Admitting: Sports Medicine

## 2018-10-05 ENCOUNTER — Inpatient Hospital Stay (HOSPITAL_COMMUNITY): Payer: Medicare PPO | Admitting: Speech Pathology

## 2018-10-05 DIAGNOSIS — I2721 Secondary pulmonary arterial hypertension: Secondary | ICD-10-CM

## 2018-10-05 MED ORDER — TAMSULOSIN HCL 0.4 MG PO CAPS
0.4000 mg | ORAL_CAPSULE | Freq: Every day | ORAL | Status: DC
  Administered 2018-10-05 – 2018-10-09 (×5): 0.4 mg via ORAL
  Filled 2018-10-05 (×5): qty 1

## 2018-10-05 NOTE — Progress Notes (Signed)
Nutrition Follow-up  RD working remotely.  DOCUMENTATION CODES:   Severe malnutrition in context of acute illness/injury  INTERVENTION:   - Continue Ensure Enlive po TID, each supplement provides 350 kcal and 20 grams of protein  - Continue Magic Cup TID with meals, each supplement provides 290 kcal and 9 grams of protein  - Continue Pro-stat 30 ml BID po, each supplement provides 100 kcal and 15 grams of protein  - Encourage adequate PO intake and provide feeding assistance as needed  NUTRITION DIAGNOSIS:   Severe Malnutrition related to acute illness (sepsis) as evidenced by energy intake < or equal to 50% for > or equal to 5 days, percent weight loss (5.7% weight loss in less than 1 week).  Ongoing, being addressed via oral nutrition supplements  GOAL:   Patient will meet greater than or equal to 90% of their needs  Unmet  MONITOR:   PO intake, Labs, Weight trends, I & O's, Skin, TF tolerance  REASON FOR ASSESSMENT:   Consult Enteral/tube feeding initiation and management  ASSESSMENT:   85 year old female with PMH of T2DM, PAF, CKD, enterovesicular fistula, sinus bradycardiawho was admitted on 09/06/18 with 1 week history of dizziness and weakness. Pt was found to have marked bradycardia,acute on chronic renal failure as well as elevated TSH. Pt developed hypotension and abdominal pain due to urosepsis and was started on IV abx for treatment. CT abdomen revealed acute pancreatitis and treated with NPO and D5NS. Pt has had issues with confusion and agitation due to delirium. Mentation improved briefly but she developed lethargy with hypothermia felt to be due to sepsis. Follow-up CT abdomen 4/27 showed no change in mild acute pancreatitis with increase in diffuse body wall edema with small to moderate bilateral pleural effusions. Pt continues to have intermittent hypothermia and mentation slowly improving. Pt admitted to CIR on 5/04.  5/07 - post-pyloric Cortrak tube  placed under fluoro, continuous TF started, pt later pulled tube 5/08 - gastric Cortrak tube placed by Cortrak team and bridledbut pt later pulled, gastric Cortrak tube replaced by Cortrak team, bolus TF to start 5/09 - pt pulled Cortrak, NGT placed by nursing 5/13 - barium swallow showing mild esophageal dysmotility, pt pulled NGT  Pt new new onset CHF with severe pulmonary hypertension. Cardiology following.  Weight down a total of 28 lbs since admission to CIR. Pt has been diuresing. Suspect some of weight loss is true weight loss given poor PO intake but unable to confirm at this time.  Reviewed RN edema assessment. Pt with mild pitting edema to BUE and BLE.  PO intake is poor. Given pt has pulled 4 NGTs since admission to CIR, do not believe she is a candidate for replacement. Recommend palliative care team involvement.  Meal Completion: 5-50% x last 8 meals (averaging 18%)  Medications reviewed and include: Oscal with D BID, Ensure Enlive TID, Pro-stat 30 ml BID, Lasix 40 mg BID, Megace 400 mg daily, Miralax, KCl  10 mEq daily  Labs reviewed: hemoglobin 7.4 (L)  UOP: 400 ml x 24 hours, ? accuracy  Diet Order:   Diet Order            DIET SOFT Room service appropriate? No; Fluid consistency: Thin  Diet effective now              EDUCATION NEEDS:   Not appropriate for education at this time  Skin:  Skin Assessment: Skin Integrity Issues: Stage II: buttocks Incisions: right foot, left foot (both present on  admission) Other: MASD to buttocks, perineum, and vagina; non-pressure wound to right arm  Last BM:  09/25/18  Height:   Ht Readings from Last 1 Encounters:  09/19/18 5\' 6"  (1.676 m)    Weight:   Wt Readings from Last 1 Encounters:  10/05/18 74.4 kg    Ideal Body Weight:  59.1 kg  BMI:  Body mass index is 26.47 kg/m.  Estimated Nutritional Needs:   Kcal:  1550-1750  Protein:  80-95 grams  Fluid:  >/= 1.6 L    Gaynell Face, MS, RD,  LDN Inpatient Clinical Dietitian Pager: [phone] Weekend/After Hours: [phone]

## 2018-10-05 NOTE — Progress Notes (Signed)
Physical Therapy Session Note  Patient Details  Name: Maureen Stout MRN: [id number] Date of Birth: 03-04-1932  Today's Date: 10/05/2018 PT Individual Time: 3570-1779 PT Individual Time Calculation (min): 60 min   Short Term Goals: Week 2:  PT Short Term Goal 1 (Week 2): pt will consistently perform transfers at min A PT Short Term Goal 2 (Week 2): pt will perform gait wtih min A x 15' in controlled environment  Skilled Therapeutic Interventions/Progress Updates:      Therapy Documentation Precautions:  Precautions Precautions: Fall Restrictions Weight Bearing Restrictions: No  Treatment: Pt awake in bed on bedpan (per nurse). Pt stating she needs to go to bathroom. Informed pt she is on bed pan with her stating "oh good". Agreeable to PT at this time.   Bed mobility: Rolling right and left with supervision, directional cues to use rails as needed. Dependent for peri care and donning of brief. Pt with hemorids, RN notified and was already aware. BP taken before sitting up with reading of 94/46. Min assist with cues for supine to sitting edge of bed, HOB 30 degrees and rail used. With cues pt able to self scoot closer to edge of bed. Seated edge of bed- scrub pants donned by PT and pulled up to pt's knees. Pt able to bring her arms into her shirt with assist to bring shirt behind her, unable to button due to apraxic movements. Supervision for unsupported sitting balance at edge of bed with these activities.   Transfers: mod assist to stand bed to RW to pull up pants. Pt able to hold balance with min assisit to try to assist with pulling up pants, supervision with walker with PTA completing the task. Pt then needed to sit back down on bed edge. Mod assist to stand a second time from bed to RW for gait with mod assist for controlled descent into chair. Sit<>stand x3 more reps at recliner chair with min assist for each of these reps. Cues needed with all transfers for hand placement and anterior  weight shifting.   Gait: 6 feet from bed to recliner chair with mod assist due to posterior lean and assist to navigate RW. Then 6 feet forward/backward for 2 laps x 2 reps, seated rest between. Min/mod assist for balance with cues on step length, foot clearance and walker use.   Exercises: unsupported sitting in recliner: heel/toe raises, long arc quads, and marching for 2 sets of 10 reps each bil LE's. Hamstring curls with level 1 band for 10 reps each leg. Standing with RW support: marching 2 sets of 10 reps each leg with min assist for balance, cues for high knees. In chair with feet up- straight leg raises for 10 reps each side, AA for controlled lowering. Visual/verbal/tactile cues for all ex's for correct form and technique.   Clinical Impression: Pt left in chair with feet up, alarm belt on and needs in reach. BP at end of session 103/44. Pt with improved activity tolerance and participation with today's session.    Therapy/Group: Individual Therapy  Lindon Romp, PTA, CLT 10/05/18, 4:41 PM

## 2018-10-05 NOTE — Progress Notes (Signed)
Occupational Therapy Session Note  Patient Details  Name: Maureen Stout MRN: [id number] Date of Birth: 23-Mar-1932  Today's Date: 10/05/2018 OT Individual Time: 1100-1125 OT Individual Time Calculation (min): 25 min    Short Term Goals: Week 2:  OT Short Term Goal 1 (Week 2): Pt will complete bathing at sit > stand level with min assist OT Short Term Goal 2 (Week 2): Pt will complete toilet transfers with min assist  OT Short Term Goal 3 (Week 2): Pt will complete LB dressing with min assist  Skilled Therapeutic Interventions/Progress Updates:    Pt asleep in recliner upon arrival.  Pt required mod multimodal cues to arouse.  Attempted to engaged pt in sitting tasks in recliner and sit<>stand from recliner.  Pt stated she was too tired to attempt. Continued encouragement and pt finally agreed to sit<>stand from recliner with max A.  Pt required mod A for standing balance. Pt declined to attempt again and remained seated in recliner with all needs within reach. Pt asleep when therapist exited room.   Therapy Documentation Precautions:  Precautions Precautions: Fall Restrictions Weight Bearing Restrictions: No :   Pain:  Pt with no c/o pain but c/o fatigue and not able to stay awake   Therapy/Group: Individual Therapy  Leroy Libman 10/05/2018, 1:57 PM

## 2018-10-05 NOTE — Patient Care Conference (Signed)
Inpatient RehabilitationTeam Conference and Plan of Care Update Date: 10/05/2018   Time: 2:00 PM    Patient Name: Maureen Stout      Medical Record Number: [id number]  Date of Birth: November 23, 1931 Sex: Female         Room/Bed: 4M01C/4M01C-01 Payor Info: Payor: [employer] MEDICARE / Plan: [employer] MEDICARE CHOICE PPO / Product Type: *No Product type* /    Admitting Diagnosis: Gen Team  Debility, sepsis; 13-16days  Admit Date/Time:  09/19/2018  3:08 PM Admission Comments: No comment available   Primary Diagnosis:  <principal problem not specified> Principal Problem: <principal problem not specified>  Patient Active Problem List   Diagnosis Date Noted  . Hypotension due to drugs   . Acute blood loss anemia   . PAH (pulmonary artery hypertension) ([place])   . Encounter for feeding tube placement   . Acute combined systolic and diastolic congestive heart failure ([place])   . Edema   . Esophageal dysmotility   . Cardiomegaly   . Dysphagia   . At risk for dehydration due to poor fluid intake   . Feeding by G-tube ([place])   . Failure to thrive (child)   . Recurrent UTI   . CKD (chronic kidney disease), stage III ([place])   . Orthostasis   . Hypokalemia   . Encephalopathy   . Acute pancreatitis   . Anasarca   . Anemia of chronic disease   . AKI (acute kidney injury) ([place])   . Stage 3 chronic kidney disease ([place])   . Urinary retention   . Debility 09/19/2018  . Sepsis due to Enterobacter species ([place]) 09/08/2018  . Bacteremia 09/08/2018  . Hypoglycemia without diagnosis of diabetes mellitus 09/08/2018  . Acute on chronic anemia 09/07/2018  . Acute pancreatitis without infection or necrosis 09/07/2018  . Bradycardia 09/07/2018  . Sinus bradycardia 09/06/2018  . Hypothyroidism 09/06/2018  . First degree AV block 09/06/2018  . Abnormal CXR 04/13/2018  . On amiodarone therapy 01/08/2018  . Chronic anticoagulation 01/08/2018  . Hypertensive heart disease 01/08/2018  . Elevated TSH 01/08/2018  .  Pressure injury of skin 04/19/2017  . Paroxysmal atrial fibrillation ([place]) 04/18/2017    Expected Discharge Date: Expected Discharge Date: 10/10/18  Team Members Present: Physician leading conference: Dr. Delice Lesch Social Worker Present: Ovidio Kin, LCSW Nurse Present: Isla Pence, RN PT Present: Roderic Ovens, PT OT Present: Simonne Come, OT SLP Present: Weston Anna, SLP PPS Coordinator present : Ileana Ladd, PT     Current Status/Progress Goal Weekly Team Focus  Medical   Functional deficits and weakness secondary to debility and encephalopathy after urosepsis/pancreatitis and multiple medical issues  Improve mobility, transfers, dysphagia, appetite, CHF  See above   Bowel/Bladder   Continent of bowel, incontinent periods of bladder, bladder scan q4-6hrs, cathed x1 on 5/19, LBM 5/15-prn dulcolax suppository given  Regain control of bowel and bladder, regain regular urinary and bowel pattern  Timed toileting while awake, assist with toileting needs   Swallow/Nutrition/ Hydration             ADL's     progressing toward min-UE and mod-LE    min-mod level   Family training  Mobility     min assist level-low endurance   downgraded goals to min assist level   Family training needed  Communication   Min A   Min A  verbal expression of wants/needs    Safety/Cognition/ Behavioral Observations  Mod-Max A   Min A  attention, problem solving and recall  Pain   Denies pain  Remain pain free  Assess pain every shift and prn   Skin   Right arm skin tear upper arm, MASD to buttocks-gerhardts cream applied  Skin free of infection, no further skin impairment  Assess skin every shift and prn, treat as ordered      *See Care Plan and progress notes for long and short-term goals.     Barriers to Discharge  Current Status/Progress Possible Resolutions Date Resolved   Physician    Medical stability;Nutrition means;Decreased caregiver support;Lack of/limited family support      See above  Therapies, multiple medical issues, follow labs, Cards recs      Nursing                  PT                    OT                  SLP                SW                Discharge Planning/Teaching Needs:  Was doing better with NG tube has had medical issues and Cardiology has been called in. Tends to flucuate in her levels. Unsure what to tell son regarding her care at DC      Team Discussion:  Cardiology seeing adjusting meds for new CHF. Started on appetite stimulant-megace. Cath last pm according to RN. Arousal and level of alertness flucuates. Needs multiple cues in therapies. Activity tolerance poor will need to have family come in to see if can manage at home.  Revisions to Treatment Plan:  DC 5/25?    Continued Need for Acute Rehabilitation Level of Care: The patient requires daily medical management by a physician with specialized training in physical medicine and rehabilitation for the following conditions: Daily direction of a multidisciplinary physical rehabilitation program to ensure safe treatment while eliciting the highest outcome that is of practical value to the patient.: Yes Daily medical management of patient stability for increased activity during participation in an intensive rehabilitation regime.: Yes Daily analysis of laboratory values and/or radiology reports with any subsequent need for medication adjustment of medical intervention for : Renal problems;Other;Mood/behavior problems;Cardiac problems   I attest that I was present, lead the team conference, and concur with the assessment and plan of the team. Teleconference held due to COVID 19   Tyishia Aune, Gardiner Rhyme 10/07/2018, 1:24 PM

## 2018-10-05 NOTE — Progress Notes (Signed)
Speech Language Pathology Daily Session Note  Patient Details  Name: Maureen Stout MRN: [id number] Date of Birth: April 24, 1932  Today's Date: 10/05/2018 SLP Individual Time: 0700-0725 SLP Individual Time Calculation (min): 25 min  Short Term Goals: Week 3: SLP Short Term Goal 1 (Week 3): Patient will demonstrate sustained attention to functional tasks for 30 minutes with Mod A verbal cues for redirection SLP Short Term Goal 2 (Week 3): Patient will demonstrate functional problem solving for basic and familiar tasks with Mod A verbal cues.  SLP Short Term Goal 3 (Week 3): Patient will utilize external aids to recall new, daily information with Mod A multimodal cues.  SLP Short Term Goal 4 (Week 3): Patient will identify 2 physical and 2 cognitive changes with Mod A verbal cues.   Skilled Therapeutic Interventions: Skilled treatment session focused on cognitive goals. Upon arrival, patient was awake in bed with her nasal cannula off, up around her nose without awareness. SLP repositioned her nasal cannula and sat patient up in bed to maximize alertness and safety with PO intake. Patient required Mod A verbal cues for problem solving with tray set-up and supervision verbal cues to express wants/needs and initiate self-feeding. Patient consumed ~40% of meal without overt s/s of aspiration and demonstrated sustained attention to meal for ~20 minutes with supervision verbal cues. Recommend patient continue current diet with full supervision. Patient left upright in bed with alarm on and all needs within reach. Continue with current plan of care.      Pain Pain Assessment Pain Scale: 0-10 Pain Score: 2  Pain Type: Acute pain Pain Location: Generalized Pain Descriptors / Indicators: Aching Pain Frequency: Rarely Pain Onset: Gradual Pain Intervention(s): Medication (See eMAR)  Therapy/Group: Individual Therapy  Mayrene Bastarache 10/05/2018, 8:19 AM

## 2018-10-05 NOTE — Progress Notes (Signed)
Physical Therapy Weekly Progress Note  Patient Details  Name: Maureen Stout MRN: [id number] Date of Birth: [birth year]  Beginning of progress report period: Sep 28, 2018 End of progress report period: Oct 05, 2018  Today's Date: 10/05/2018 PT Individual Time: 3013-1438 PT Individual Time Calculation (min): 60 min (see separate treatment note as well for more details)  Pt with overall improved participation with session today. Has been limited by cardiac issues and cardiology now adjusting her medications. Pt's BP did not decrease with activity today and transfer/gait training resumed. The pt continues to be sleepy and confused at times.   Patient continues to demonstrate the following deficits muscle weakness and decreased sitting balance, decreased standing balance and gait deficits and therefore will continue to benefit from skilled PT intervention to increase functional independence with mobility.  Patient slowly progressing toward long term goals..  Continue plan of care.  PT Short Term Goals Week 2:  PT Short Term Goal 1 (Week 2): pt will consistently perform transfers at min A PT Short Term Goal 2 (Week 2): pt will perform gait wtih min A x 15' in controlled environment  Skilled Therapeutic Interventions/Progress Updates:      Therapy Documentation Precautions:  Precautions Precautions: Fall Restrictions Weight Bearing Restrictions: No   Therapy/Group: Individual Therapy  Willow Ora 10/05/2018, 8:19 PM   Willow Ora, PTA, CLT 10/05/18, 8:29 PM

## 2018-10-05 NOTE — Progress Notes (Signed)
[place] PHYSICAL MEDICINE & REHABILITATION PROGRESS NOTE  Subjective/Complaints: Patient seen sitting up in bed this morning.  She states she slept fairly overnight, confirmed with sleep chart.  She is more alert this morning.  She has questions regarding discharge.  She was seen by cardiology yesterday, notes reviewed.  ROS: Denies CP, shortness of breath, nausea, vomiting, diarrhea.   Objective: Vital Signs: Blood pressure 123/77, pulse 75, temperature 97.8 F (36.6 C), temperature source Oral, resp. rate 18, height 5\' 6"  (1.676 m), weight 74.4 kg, SpO2 100 %. No results found. Recent Labs    10/03/18 0335  WBC 5.5  HGB 7.4*  HCT 24.3*  PLT 161   Recent Labs    10/03/18 1047 10/04/18 0408  NA 141 140  K 4.8 4.7  CL 97* 96*  CO2 32 33*  GLUCOSE 152* 105*  BUN 40* 39*  CREATININE 2.00* 1.81*  CALCIUM 9.4 9.3    Physical Exam: BP 123/77 (BP Location: Left Arm)   Pulse 75   Temp 97.8 F (36.6 C) (Oral)   Resp 18   Ht 5\' 6"  (1.676 m)   Wt 74.4 kg   SpO2 100%   BMI 26.47 kg/m  Constitutional: No distress . Vital signs reviewed. HENT: Normocephalic.  Atraumatic Eyes: EOMI.  No discharge. Cardiovascular: No JVD. Respiratory: Normal effort.  + [place]. GI: Non-distended. Musc: Generalized edema, stable Neurological: Alert and oriented x 3. Motor: Grossly 4-/5 throughout, unchanged Skin: Frail skin. Psychiatric: Flat  Assessment/Plan: 1. Functional deficits secondary to debility and encephalopathy which require 3+ hours per day of interdisciplinary therapy in a comprehensive inpatient rehab setting.  Physiatrist is providing close team supervision and 24 hour management of active medical problems listed below.  Physiatrist and rehab team continue to assess barriers to discharge/monitor patient progress toward functional and medical goals  Care Tool:  Bathing  Bathing activity did not occur: Refused Body parts bathed by patient: Right arm, Left arm, Chest,  Abdomen, Face   Body parts bathed by helper: Front perineal area, Buttocks, Right upper leg, Left upper leg, Right lower leg, Left lower leg     Bathing assist Assist Level: Maximal Assistance - Patient 24 - 49%     Upper Body Dressing/Undressing Upper body dressing Upper body dressing/undressing activity did not occur (including orthotics): Refused What is the patient wearing?: Pull over shirt    Upper body assist Assist Level: Moderate Assistance - Patient 50 - 74%    Lower Body Dressing/Undressing Lower body dressing    Lower body dressing activity did not occur: Refused What is the patient wearing?: Pants, Incontinence brief     Lower body assist Assist for lower body dressing: Total Assistance - Patient < 25%     Toileting Toileting Toileting Activity did not occur Landscape architect and hygiene only): Safety/medical concerns  Toileting assist Assist for toileting: Total Assistance - Patient < 25%     Transfers Chair/bed transfer  Transfers assist     Chair/bed transfer assist level: Moderate Assistance - Patient 50 - 74%     Locomotion Ambulation   Ambulation assist      Assist level: Moderate Assistance - Patient 50 - 74% Assistive device: Walker-rolling Max distance: 2   Walk 10 feet activity   Assist  Walk 10 feet activity did not occur: Safety/medical concerns  Assist level: Minimal Assistance - Patient > 75% Assistive device: Walker-rolling   Walk 50 feet activity   Assist Walk 50 feet with 2 turns activity did not  occur: Safety/medical concerns         Walk 150 feet activity   Assist Walk 150 feet activity did not occur: Safety/medical concerns         Walk 10 feet on uneven surface  activity   Assist Walk 10 feet on uneven surfaces activity did not occur: Safety/medical concerns         Wheelchair     Assist Will patient use wheelchair at discharge?: No Type of Wheelchair: Manual    Wheelchair assist  level: Maximal Assistance - Patient 25 - 49% Max wheelchair distance: 10    Wheelchair 50 feet with 2 turns activity    Assist            Wheelchair 150 feet activity     Assist            Medical Problem List and Plan: 1.Functional deficits and weaknesssecondary to debility and encephalopathy after urosepsis/pancreatitis and multiple medical issues  Cont CIR, 15/7  Thyroid function within normal limits  Team conference today to discuss current and goals and coordination of care, home and environmental barriers, and discharge planning with nursing, case manager, and therapies.  2. Antithrombotics: -PAF/DVT/anticoagulation:Pharmaceutical:Other (comment)- Eliquis 3. Pain Management:Tylenol prn. 4. Mood:LCSW to follow for evaluation and support. -antipsychotic agents: N/A 5. Neuropsych: This patientisnot fully capable of making decisions on herown behalf. 6. Skin/Wound Care:Routine pressure relief measures. 7. Fluids/Electrolytes/Nutrition:Strict I/O.   PO intake remains limited  Megace initiated  IVF d/ced  Barium swallow showing mild esophageal dysmotility 8. Enterococcus bacteremia/UTI: Completed 14-day course of Maxipime   Amoxicillin 500 mg nightly started on 5/7 9. Anasarca/FLuid overload: Added heart healthy restrictions. Weight daily. Lasix daily prn depending stability of weights/symptoms.  Filed Weights   10/03/18 0520 10/04/18 0559 10/05/18 0356  Weight: 78.9 kg 76.4 kg 74.4 kg   Improving on 5/20 10. PAF: Monitor HR bid--back on amiodarone and Eliquis. Off metoprolol due to bradycardia. 11. Urinary retention with recurrent UTIs:   Bladder prolapse  Amoxicillin 500 mg nightly started on 5/7 per IM  Repeat urine culture with yeast-Diflucan x1 completed  Cath for volumes > 350 cc. 12. Acute on Chronic renal failure: SCr- 1.32 on 04/2018. Continue to monitor with routine checks.   Creatinine at 1.81 on 5/19, labs ordered  for tomorrow  Encourage fluids 13. Delirium: Resolving.  14. Anemia of chronic disease: Baseline Hg around 9.0? Likely dropafterhydration.   Monitor for signs of bleeding.   Hemoglobin 7.4 on 5/18  Continue to monitor 15. Acute pancreatitis: Offer supplements with meals.   LFTs elevated, but improving on 5/15  Amylase/lipase within normal limits  Ammonia WNL  Continue to monitor 16. Hypothyroidism: Now synthroid 75 mcg/day. 27.  Hypertension  Chest x-ray reviewed, showing cardiomegaly.  BNP elevated.  See #20  Cards adjusting medications  Labile on 5/20 18.  Hypokalemia  Potassium 4.7 on 5/19, labs ordered for tomorrow  Supplement increased on 5/7, decreased on 5/12, decreased again on 5/13  IV supplement ordered on 5/8 19. Hypotheramia: Resolved  Did not require bear hugger last night, cont to monitor 20.  Acute combined CHF  CXR showing cardiomegaly  BNP elevated  Echo 2018 reviewed, showing EF 60-65%, repeat Echo showing significant decrease in ejection fraction of 27-25% with diastolic heart failure as well.   Not a candidate for additional procedures at this time, medications being adjusted by cards-appreciate recs  ECG reviewed, stable 21. Esophageal Dysmotility  Per barium swallow  Per GI monitor now that  NG tube has been removed. 22. Prediabtes  Labile blood glucose on 5/19  LOS: 16 days A FACE TO FACE EVALUATION WAS PERFORMED   [name] 10/05/2018, 8:39 AM

## 2018-10-05 NOTE — Progress Notes (Signed)
Social Work Patient ID: Maureen Stout, female   DOB: [birth year], 85 y.o.   MRN: [id number] [place] with son via telephone to discuss team conference and how his Mom flucuates in her levels and her alertness It comes down to if he and his wife can manage her care at home. Will have them come in tomorrow for her PT session at 2:30. He reports tomorrow is better than Friday. Have let PT know and will see how it goes tomorrow. See when here tomorrow.

## 2018-10-05 NOTE — Progress Notes (Addendum)
Progress Note  Patient Name: Maureen Stout Date of Encounter: 10/05/2018  Primary Cardiologist: Shirlee More, MD   Subjective   No complaints this morning. Denies CP and dyspnea. Progressing well in rehab.   Inpatient Medications    Scheduled Meds: . amiodarone  100 mg Oral Daily  . amoxicillin  500 mg Oral QHS  . apixaban  2.5 mg Oral BID  . atorvastatin  10 mg Oral Daily  . calcium-vitamin D  1 tablet Oral BID  . feeding supplement (ENSURE ENLIVE)  237 mL Oral TID BM  . feeding supplement (PRO-STAT SUGAR FREE 64)  30 mL Oral BID  . furosemide  40 mg Oral BID  . Gerhardt's butt cream   Topical BID  . hydrALAZINE  37.5 mg Oral TID  . isosorbide dinitrate  10 mg Oral TID  . levothyroxine  75 mcg Oral Q0600  . mouth rinse  15 mL Mouth Rinse BID  . megestrol  400 mg Oral Daily  . polyethylene glycol  17 g Oral Daily  . potassium chloride  10 mEq Oral Daily  . sodium chloride flush  10-40 mL Intracatheter Q12H   Continuous Infusions:  PRN Meds: acetaminophen, alum & mag hydroxide-simeth, bisacodyl, calcium carbonate, diphenhydrAMINE, guaiFENesin-dextromethorphan, ondansetron (ZOFRAN) IV **OR** ondansetron, polyethylene glycol, prochlorperazine **OR** prochlorperazine **OR** prochlorperazine, senna-docusate, sodium chloride flush, sodium phosphate   Vital Signs    Vitals:   10/04/18 1509 10/04/18 1945 10/04/18 1950 10/05/18 0356  BP: (!) 107/52 (!) 113/52 116/62 123/77  Pulse: 74 67  75  Resp: 17 19  18   Temp: 98.6 F (37 C) (!) 97.5 F (36.4 C)  97.8 F (36.6 C)  TempSrc: Oral   Oral  SpO2: 100% 100%  100%  Weight:    74.4 kg  Height:        Intake/Output Summary (Last 24 hours) at 10/05/2018 3086 Last data filed at 10/04/2018 2220 Gross per 24 hour  Intake 250 ml  Output 400 ml  Net -150 ml   Last 3 Weights 10/05/2018 10/04/2018 10/03/2018  Weight (lbs) 164 lb 0.4 oz 168 lb 6.9 oz 173 lb 15.1 oz  Weight (kg) 74.4 kg 76.4 kg 78.9 kg      Telemetry    N/A-  Personally Reviewed  ECG    Not performed today - Personally Reviewed  Physical Exam   GEN: No acute distress.   Neck: No JVD Cardiac: RRR, no murmurs, rubs, or gallops.  Respiratory: Clear to auscultation bilaterally. GI: Soft, nontender, non-distended  MS: No edema; No deformity. Neuro:  Nonfocal  Psych: Normal affect   Labs    Chemistry Recent Labs  Lab 09/30/18 0336 10/01/18 0414 10/03/18 1047 10/04/18 0408  NA 139 140 141 140  K 4.3 4.1 4.8 4.7  CL 104 107 97* 96*  CO2 25 25 32 33*  GLUCOSE 106* 92 152* 105*  BUN 29* 30* 40* 39*  CREATININE 1.58* 1.37* 2.00* 1.81*  CALCIUM 9.1 8.1* 9.4 9.3  PROT 5.5*  --   --   --   ALBUMIN 2.6*  --   --   --   AST 46*  --   --   --   ALT 53*  --   --   --   ALKPHOS 142*  --   --   --   BILITOT 0.5  --   --   --   GFRNONAA 30* 35* 22* 25*  GFRAA 34* 41* 26* 29*  ANIONGAP 10 8  12 11     Hematology Recent Labs  Lab 10/03/18 0335  WBC 5.5  RBC 2.45*  HGB 7.4*  HCT 24.3*  MCV 99.2  MCH 30.2  MCHC 30.5  RDW 18.6*  PLT 161    Cardiac EnzymesNo results for input(s): TROPONINI in the last 168 hours. No results for input(s): TROPIPOC in the last 168 hours.   BNP Recent Labs  Lab 09/28/18 1117  BNP 241.8*     DDimer No results for input(s): DDIMER in the last 168 hours.   Radiology    No results found.  Cardiac Studies   2D Echo 09/29/18 IMPRESSIONS   1. The left ventricle has moderate-severely reduced systolic function, with an ejection fraction of 30-35%. The cavity size was mildly dilated. Left ventricular diastolic Doppler parameters are consistent with pseudonormalization. Elevated left atrial  and left ventricular end-diastolic pressures Left ventricular diffuse hypokinesis. 2. The right ventricle has mildly reduced systolic function. The cavity was mildly enlarged. There is no increase in right ventricular wall thickness. Right ventricular systolic pressure is severely elevated with an estimated  pressure of 56.6 mmHg. 3. Left atrial size was mildly dilated. 4. Right atrial size was mildly dilated. 5. Large pleural effusion in the left lateral region. 6. Trivial pericardial effusion is present. 7. Tricuspid valve regurgitation is mild-moderate. 8. The aortic valve is tricuspid. Mild thickening of the aortic valve. Mild calcification of the aortic valve. Aortic valve regurgitation is mild by color flow Doppler. No stenosis of the aortic valve.  Patient Profile     Renaldo Reel Connoris a 85 y.o.femalewith a hx of paroxysmal atrial fibrillation, h/o sinus bradycardia beta-blocker previously held, CKD stage III, dyslipidemia, hypertension, recent urosepsis and pancreatitis now in CIRwho is being seen today for the evaluation of new onset systolic heart failureat the request of Dr. Posey Pronto.  Assessment & Plan    1. Acute Combined Systolic and Diastolic CHF: EF 73% with severe pulmonary HTN. Treated w/ Lasix. Has been transitioned from IV to PO Lasix.  I/Os not accurate as UOP has not been regularly recorded day to day. MD to examine pt and will assess volume status and will make decision regarding diuretic dosing.   2. Atrial Fibrillation: on amiodarone and Eliquis. On non tele unit but based on vital sign checks, her pulse rates have been controlled in the 60s-70s.   3. HTN: BP better this morning after increasing hydralazine and isordil. Continue to monitor.   4. Stage III CKD: AKI earlier likely secondary to diuresis. Diuretics adjusted from IV to PO. SCr improving, trending down from 2.00>>1.81. Continue to monitor.   5. Anemia: may need transfusion. Hgb on 5/18 was 7.4. CBC not repeated. Will defer to primary team.   For questions or updates, please contact [place] Please consult [email or web address] for contact info under        Signed, Lyda Jester, PA-C  10/05/2018, 8:37 AM    I have seen and examined the patient along with Lyda Jester, PA-C .  I have  reviewed the chart, notes and new data.  I agree with PA's note.  Key new complaints: denies dyspnea, feels better, interested in plans for DC Key examination changes: JVP 4-5 cm, legs showing some wrinkles, no pedal edema, some ankle puffiness remains Key new findings / data: improved renal parameters after transition to PO diuretics  PLAN: Tolerating higher dose of hydralazine/nitrates, biut it is unlikely that her BP will allow further dose titration.. Suspect she is close to euvolemia.  Will continue the current diuretic prescription. Weight is almost 30 lb lower than peak weight this admission. Some degree of edema and JVD may persist due to PAH/RHF.  Sanda Klein, MD, [place] [phone] 10/05/2018, 1:07 PM

## 2018-10-06 ENCOUNTER — Inpatient Hospital Stay (HOSPITAL_COMMUNITY): Payer: Medicare PPO

## 2018-10-06 ENCOUNTER — Inpatient Hospital Stay (HOSPITAL_COMMUNITY): Payer: Medicare PPO | Admitting: Speech Pathology

## 2018-10-06 ENCOUNTER — Inpatient Hospital Stay (HOSPITAL_COMMUNITY): Payer: Medicare PPO | Admitting: Occupational Therapy

## 2018-10-06 LAB — BASIC METABOLIC PANEL
Anion gap: 9 (ref 5–15)
BUN: 57 mg/dL — ABNORMAL HIGH (ref 8–23)
CO2: 32 mmol/L (ref 22–32)
Calcium: 9 mg/dL (ref 8.9–10.3)
Chloride: 98 mmol/L (ref 98–111)
Creatinine, Ser: 2.42 mg/dL — ABNORMAL HIGH (ref 0.44–1.00)
GFR calc Af Amer: 21 mL/min — ABNORMAL LOW (ref >60)
GFR calc non Af Amer: 18 mL/min — ABNORMAL LOW (ref >60)
Glucose, Bld: 97 mg/dL (ref 70–99)
Potassium: 5 mmol/L (ref 3.5–5.1)
Sodium: 139 mmol/L (ref 135–145)

## 2018-10-06 MED ORDER — FUROSEMIDE 20 MG PO TABS: 20.0000 mg | ORAL_TABLET | Freq: Two times a day (BID) | ORAL | Status: DC

## 2018-10-06 NOTE — Progress Notes (Signed)
Occupational Therapy Session Note  Patient Details  Name: Maureen Stout MRN: [id number] Date of Birth: [birth year]  Today's Date: 10/06/2018 OT Individual Time: 1300-1330 OT Individual Time Calculation (min): 30 min    Skilled Therapeutic Interventions/Progress Updates:    1:1 Pt when bed when arrived. Pt required min A to come to EOB. Engaged in donning pants but required min cues for sitting balance to maintain forward weight shift and then  A for threading. Pt able to perform sit to stands with RW with min A- assistance to pull up pants. Ambulated 6 steps to get into w/c with min A with A for steering RW. Pt transitioned into the hallway and perform functional ambulation 2x 20 feet with min A with cues (tactile and verbal) for RW safety and turning. Pt perform toilet transfer with min A with grab bar - contact guard for sit to stands off the commode. Pt did require total A for toileting tasks. Returned to w/c again with min A. Left sitting up in w/   Therapy Documentation Precautions:  Precautions Precautions: Fall Restrictions Weight Bearing Restrictions: No Pain: No c/o pain in session nor c/o dizziness   Therapy/Group: Individual Therapy  Willeen [facility] 10/06/2018, 3:40 PM

## 2018-10-06 NOTE — Progress Notes (Signed)
Social Work Patient ID: Maureen Stout, female   DOB: [birth year], 85 y.o.   MRN: [id number] Son and daughter in-law here to go through family education to see if can be managed at home. Both did well and feel hey can provide the care pt requires at discharge. [name]-PT completed family education. Discussed discharge needs-son has access to al of the equipment except O2. Will make sure needs at home. Plan to go to her home and will arrange follow up there. Pam-PA discussing I & O cath and or home with a foley. Daughter in-law to think about it and get back with Pam-PA. All aware plan for discharge on Monday. Family will provide 24 hr care at home. Pt doing well today with family here. Work toward discharge Monday.

## 2018-10-06 NOTE — Progress Notes (Signed)
PHYSICAL MEDICINE & REHABILITATION PROGRESS NOTE  Subjective/Complaints: Patient seen laying in bed this morning.  She did not sleep well per sleep chart, patient states she slept well.  Encourage patient to increase oral nutritional intake.  ROS: Denies CP, shortness of breath, nausea, vomiting, diarrhea.   Objective: Vital Signs: Blood pressure (!) 102/54, pulse 83, temperature 99.1 F (37.3 C), temperature source Oral, resp. rate 19, height 5\' 6"  (1.676 m), weight 74.2 kg, SpO2 97 %. No results found. No results for input(s): WBC, HGB, HCT, PLT in the last 72 hours. Recent Labs    10/04/18 0408 10/06/18 0601  NA 140 139  K 4.7 5.0  CL 96* 98  CO2 33* 32  GLUCOSE 105* 97  BUN 39* 57*  CREATININE 1.81* 2.42*  CALCIUM 9.3 9.0    Physical Exam: BP (!) 102/54 (BP Location: Left Arm)   Pulse 83   Temp 99.1 F (37.3 C) (Oral)   Resp 19   Ht 5\' 6"  (1.676 m)   Wt 74.2 kg   SpO2 97%   BMI 26.40 kg/m  Constitutional: No distress . Vital signs reviewed. HENT: Normocephalic.  Atraumatic. Eyes: EOMI.  No discharge. Cardiovascular: No JVD. Respiratory: Normal effort.  + [place]. GI: Non-distended. Musc: Generalized edema, improving Neurological: Alert and oriented, except for date of month. Motor: Grossly 4-/5 throughout, stable Skin: Frail skin. Psychiatric: Flat  Assessment/Plan: 1. Functional deficits secondary to debility and encephalopathy which require 3+ hours per day of interdisciplinary therapy in a comprehensive inpatient rehab setting.  Physiatrist is providing close team supervision and 24 hour management of active medical problems listed below.  Physiatrist and rehab team continue to assess barriers to discharge/monitor patient progress toward functional and medical goals  Care Tool:  Bathing  Bathing activity did not occur: Refused Body parts bathed by patient: Right arm, Left arm, Chest, Abdomen, Face   Body parts bathed by helper: Front  perineal area, Buttocks, Right upper leg, Left upper leg, Right lower leg, Left lower leg     Bathing assist Assist Level: Maximal Assistance - Patient 24 - 49%     Upper Body Dressing/Undressing Upper body dressing Upper body dressing/undressing activity did not occur (including orthotics): Refused What is the patient wearing?: Pull over shirt    Upper body assist Assist Level: Moderate Assistance - Patient 50 - 74%    Lower Body Dressing/Undressing Lower body dressing    Lower body dressing activity did not occur: Refused What is the patient wearing?: Pants, Incontinence brief     Lower body assist Assist for lower body dressing: Total Assistance - Patient < 25%     Toileting Toileting Toileting Activity did not occur Landscape architect and hygiene only): Safety/medical concerns  Toileting assist Assist for toileting: Total Assistance - Patient < 25%     Transfers Chair/bed transfer  Transfers assist     Chair/bed transfer assist level: Moderate Assistance - Patient 50 - 74%     Locomotion Ambulation   Ambulation assist      Assist level: Moderate Assistance - Patient 50 - 74% Assistive device: Walker-rolling Max distance: 6   Walk 10 feet activity   Assist  Walk 10 feet activity did not occur: Safety/medical concerns  Assist level: Minimal Assistance - Patient > 75% Assistive device: Walker-rolling   Walk 50 feet activity   Assist Walk 50 feet with 2 turns activity did not occur: Safety/medical concerns         Walk 150 feet activity  Assist Walk 150 feet activity did not occur: Safety/medical concerns         Walk 10 feet on uneven surface  activity   Assist Walk 10 feet on uneven surfaces activity did not occur: Safety/medical concerns         Wheelchair     Assist Will patient use wheelchair at discharge?: No Type of Wheelchair: Manual    Wheelchair assist level: Maximal Assistance - Patient 25 - 49% Max  wheelchair distance: 10    Wheelchair 50 feet with 2 turns activity    Assist            Wheelchair 150 feet activity     Assist            Medical Problem List and Plan: 1.Functional deficits and weaknesssecondary to debility and encephalopathy after urosepsis/pancreatitis and multiple medical issues  Cont CIR, 15/7  Thyroid function within normal limits 2. Antithrombotics: -PAF/DVT/anticoagulation:Pharmaceutical:Other (comment)- Eliquis 3. Pain Management:Tylenol prn. 4. Mood:LCSW to follow for evaluation and support. -antipsychotic agents: N/A 5. Neuropsych: This patientisnot fully capable of making decisions on herown behalf. 6. Skin/Wound Care:Routine pressure relief measures. 7. Fluids/Electrolytes/Nutrition:Strict I/O.   PO intake remains limited, appears to be slowly improving  Megace initiated  IVF d/ced  Barium swallow showing mild esophageal dysmotility 8. Enterococcus bacteremia/UTI: Completed 14-day course of Maxipime   Amoxicillin 500 mg nightly started on 5/7 9. Anasarca/FLuid overload: Added heart healthy restrictions. Weight daily. Lasix daily prn depending stability of weights/symptoms.  Filed Weights   10/04/18 0559 10/05/18 0356 10/06/18 0326  Weight: 76.4 kg 74.4 kg 74.2 kg   Stable on 5/21 10. PAF: Monitor HR bid--back on amiodarone and Eliquis. Off metoprolol due to bradycardia. 11. Urinary retention with recurrent UTIs:   Bladder prolapse  Amoxicillin 500 mg nightly started on 5/7 per IM  Repeat urine culture with yeast-Diflucan x1 completed  Cath for volumes > 350 cc. 12. Acute on Chronic renal failure: SCr- 1.32 on 04/2018. Continue to monitor with routine checks.   Creatinine at 2.42 on 5/21  Lasix decreased on 5/21  Encourage fluids 13. Delirium: Resolving.  14. Anemia of chronic disease: Baseline Hg around 9.0? Likely dropafterhydration.   Monitor for signs of bleeding.   Hemoglobin 7.4 on  5/18  Continue to monitor 15. Acute pancreatitis: Offer supplements with meals.   LFTs elevated, but improving on 5/15  Amylase/lipase within normal limits  Ammonia WNL  Continue to monitor 16. Hypothyroidism: Now synthroid 75 mcg/day. 63.  Hypertension  Chest x-ray reviewed, showing cardiomegaly.  BNP elevated.  See #20  Cards adjusting medications  Labile on 5/20 18.  Hypokalemia  Potassium 4.7 on 5/19, labs ordered for tomorrow  Supplement increased on 5/7, decreased on 5/12, decreased again on 5/13, DC'd on 5/21  IV supplement ordered on 5/8 19. Hypotheramia: Resolved  Did not require bear hugger last night, cont to monitor 20.  Acute combined CHF  CXR showing cardiomegaly  BNP elevated  Echo 2018 reviewed, showing EF 60-65%, repeat Echo showing significant decrease in ejection fraction of 73-42% with diastolic heart failure as well.   Not a candidate for additional procedures at this time, medications being adjusted by cards-appreciate recs  ECG reviewed, stable 21. Esophageal Dysmotility  Per barium swallow  Per GI monitor now that NG tube has been removed. 22. Prediabtes  Labile blood glucose on 5/19  LOS: 17 days A FACE TO FACE EVALUATION WAS PERFORMED  Everhett Bozard [name] 10/06/2018, 9:24 AM

## 2018-10-06 NOTE — Progress Notes (Signed)
Physical Therapy Session Note  Patient Details  Name: Maureen Stout MRN: [id number] Date of Birth: [birth year]  Today's Date: 10/06/2018 PT Individual Time: 8185-6314 PT Individual Time Calculation (min): 43 min   Short Term Goals: Week 1:  PT Short Term Goal 1 (Week 1): pt will perform functional transfers with min A PT Short Term Goal 1 - Progress (Week 1): Progressing toward goal PT Short Term Goal 2 (Week 1): pt will perform gait x 15' in controlled environment with min A PT Short Term Goal 2 - Progress (Week 1): Progressing toward goal Week 2:  PT Short Term Goal 1 (Week 2): pt will consistently perform transfers at min A PT Short Term Goal 2 (Week 2): pt will perform gait wtih min A x 15' in controlled environment Week 3:   = LTGs  Skilled Therapeutic Interventions/Progress Updates:    Session focused on family education (hands on training) with pt's son and daughter in law in preparation for d/c. Pt able to participate and perform functional transfers including bed <> chair with RW, bed mobility, short distance gait with RW, simulated car transfer <> w/c, and stepping over simulated shower threshold backwards with RW all with overall CGA to min assist. Pt requires mod verbal and tactile cues for technique and sequencing. Family return demonstrated various transfers with cues in regards to body mechanics, encouraging patient to perform as much as possible before intervening, and appropriate hand placement for guarding and facilitation techniques. Both were able to provide appropriate level of min assist with cues from therapist. Discussed fluctuating levels of assist and best way to facilitate movement if pt requires more assist. Educated on importance of assessing BP and symptoms of dizziness as pt high fall risk. Report they already have a RW and son plans on getting w/c. Pt and family deny concerns in regards to d/c home or mobility at this time.   Therapy Documentation Precautions:   Precautions Precautions: Fall Restrictions Weight Bearing Restrictions: No   Vital Signs: Therapy Vitals Pulse Rate: 75 Resp: 18 BP: (!) 104/48 Patient Position (if appropriate): Sitting Pain: Denies pain.   Therapy/Group: Individual Therapy  Canary Brim Ivory Broad, PT, DPT, CBIS  10/06/2018, 3:47 PM

## 2018-10-06 NOTE — Progress Notes (Signed)
[facility] PHYSICAL MEDICINE & REHABILITATION PROGRESS NOTE  Subjective/Complaints:   ROS: Denies CP, shortness of breath, nausea, vomiting, diarrhea.   Objective: Vital Signs: Blood pressure (!) 102/54, pulse 83, temperature 99.1 F (37.3 C), temperature source Oral, resp. rate 19, height 5\' 6"  (1.676 m), weight 74.2 kg, SpO2 97 %. No results found. No results for input(s): WBC, HGB, HCT, PLT in the last 72 hours. Recent Labs    10/04/18 0408 10/06/18 0601  NA 140 139  K 4.7 5.0  CL 96* 98  CO2 33* 32  GLUCOSE 105* 97  BUN 39* 57*  CREATININE 1.81* 2.42*  CALCIUM 9.3 9.0    Physical Exam: BP (!) 102/54 (BP Location: Left Arm)   Pulse 83   Temp 99.1 F (37.3 C) (Oral)   Resp 19   Ht 5\' 6"  (1.676 m)   Wt 74.2 kg   SpO2 97%   BMI 26.40 kg/m  Constitutional: No distress . Vital signs reviewed. HENT: Normocephalic.  Atraumatic Eyes: EOMI.  No discharge. Cardiovascular: No JVD. Respiratory: Normal effort.  + [place]. GI: Non-distended. Musc: Generalized edema, stable Neurological: Alert and oriented x 3. Motor: Grossly 4-/5 throughout, unchanged Skin: Frail skin. Psychiatric: Flat  Assessment/Plan: 1. Functional deficits secondary to debility and encephalopathy which require 3+ hours per day of interdisciplinary therapy in a comprehensive inpatient rehab setting.  Physiatrist is providing close team supervision and 24 hour management of active medical problems listed below.  Physiatrist and rehab team continue to assess barriers to discharge/monitor patient progress toward functional and medical goals  Care Tool:  Bathing  Bathing activity did not occur: Refused Body parts bathed by patient: Right arm, Left arm, Chest, Abdomen, Face   Body parts bathed by helper: Front perineal area, Buttocks, Right upper leg, Left upper leg, Right lower leg, Left lower leg     Bathing assist Assist Level: Maximal Assistance - Patient 24 - 49%     Upper Body  Dressing/Undressing Upper body dressing Upper body dressing/undressing activity did not occur (including orthotics): Refused What is the patient wearing?: Pull over shirt    Upper body assist Assist Level: Moderate Assistance - Patient 50 - 74%    Lower Body Dressing/Undressing Lower body dressing    Lower body dressing activity did not occur: Refused What is the patient wearing?: Pants, Incontinence brief     Lower body assist Assist for lower body dressing: Total Assistance - Patient < 25%     Toileting Toileting Toileting Activity did not occur Landscape architect and hygiene only): Safety/medical concerns  Toileting assist Assist for toileting: Total Assistance - Patient < 25%     Transfers Chair/bed transfer  Transfers assist     Chair/bed transfer assist level: Moderate Assistance - Patient 50 - 74%     Locomotion Ambulation   Ambulation assist      Assist level: Moderate Assistance - Patient 50 - 74% Assistive device: Walker-rolling Max distance: 6   Walk 10 feet activity   Assist  Walk 10 feet activity did not occur: Safety/medical concerns  Assist level: Minimal Assistance - Patient > 75% Assistive device: Walker-rolling   Walk 50 feet activity   Assist Walk 50 feet with 2 turns activity did not occur: Safety/medical concerns         Walk 150 feet activity   Assist Walk 150 feet activity did not occur: Safety/medical concerns         Walk 10 feet on uneven surface  activity   Assist  Walk 10 feet on uneven surfaces activity did not occur: Safety/medical concerns         Wheelchair     Assist Will patient use wheelchair at discharge?: No Type of Wheelchair: Manual    Wheelchair assist level: Maximal Assistance - Patient 25 - 49% Max wheelchair distance: 10    Wheelchair 50 feet with 2 turns activity    Assist            Wheelchair 150 feet activity     Assist            Medical Problem List  and Plan: 1.Functional deficits and weaknesssecondary to debility and encephalopathy after urosepsis/pancreatitis and multiple medical issues  Cont CIR, 15/7  Thyroid function within normal limits  Team conference today to discuss current and goals and coordination of care, home and environmental barriers, and discharge planning with nursing, case manager, and therapies.  2. Antithrombotics: -PAF/DVT/anticoagulation:Pharmaceutical:Other (comment)- Eliquis 3. Pain Management:Tylenol prn. 4. Mood:LCSW to follow for evaluation and support. -antipsychotic agents: N/A 5. Neuropsych: This patientisnot fully capable of making decisions on herown behalf. 6. Skin/Wound Care:Routine pressure relief measures. 7. Fluids/Electrolytes/Nutrition:Strict I/O.   PO intake remains limited  Megace initiated  IVF d/ced  Barium swallow showing mild esophageal dysmotility 8. Enterococcus bacteremia/UTI: Completed 14-day course of Maxipime   Amoxicillin 500 mg nightly started on 5/7 9. Anasarca/FLuid overload: Added heart healthy restrictions. Weight daily. Lasix daily prn depending stability of weights/symptoms.  Filed Weights   10/04/18 0559 10/05/18 0356 10/06/18 0326  Weight: 76.4 kg 74.4 kg 74.2 kg   Improving on 5/20 10. PAF: Monitor HR bid--back on amiodarone and Eliquis. Off metoprolol due to bradycardia. 11. Urinary retention with recurrent UTIs:   Bladder prolapse  Amoxicillin 500 mg nightly started on 5/7 per IM  Repeat urine culture with yeast-Diflucan x1 completed  Cath for volumes > 350 cc. 12. Acute on Chronic renal failure: SCr- 1.32 on 04/2018. Continue to monitor with routine checks.   Creatinine at 1.81 on 5/19, labs ordered for tomorrow  Encourage fluids 13. Delirium: Resolving.  14. Anemia of chronic disease: Baseline Hg around 9.0? Likely dropafterhydration.   Monitor for signs of bleeding.   Hemoglobin 7.4 on 5/18  Continue to monitor 15. Acute  pancreatitis: Offer supplements with meals.   LFTs elevated, but improving on 5/15  Amylase/lipase within normal limits  Ammonia WNL  Continue to monitor 16. Hypothyroidism: Now synthroid 75 mcg/day. 94.  Hypertension  Chest x-ray reviewed, showing cardiomegaly.  BNP elevated.  See #20  Cards adjusting medications  Labile on 5/20 18.  Hypokalemia  Potassium 4.7 on 5/19, labs ordered for tomorrow  Supplement increased on 5/7, decreased on 5/12, decreased again on 5/13  IV supplement ordered on 5/8 19. Hypotheramia: Resolved  Did not require bear hugger last night, cont to monitor 20.  Acute combined CHF  CXR showing cardiomegaly  BNP elevated  Echo 2018 reviewed, showing EF 60-65%, repeat Echo showing significant decrease in ejection fraction of 34-19% with diastolic heart failure as well.   Not a candidate for additional procedures at this time, medications being adjusted by cards-appreciate recs  ECG reviewed, stable 21. Esophageal Dysmotility  Per barium swallow  Per GI monitor now that NG tube has been removed. 22. Prediabtes  Labile blood glucose on 5/19  LOS: 17 days A FACE TO FACE EVALUATION WAS PERFORMED  Amarah Brossman [name] 10/06/2018, 9:16 AM

## 2018-10-06 NOTE — Progress Notes (Signed)
Speech Language Pathology Daily Session Note  Patient Details  Name: Maureen Stout MRN: [id number] Date of Birth: 1932/05/05  Today's Date: 10/06/2018 SLP Individual Time: 9244-6286 SLP Individual Time Calculation (min): 40 min  Short Term Goals: Week 3: SLP Short Term Goal 1 (Week 3): Patient will demonstrate sustained attention to functional tasks for 30 minutes with Mod A verbal cues for redirection SLP Short Term Goal 2 (Week 3): Patient will demonstrate functional problem solving for basic and familiar tasks with Mod A verbal cues.  SLP Short Term Goal 3 (Week 3): Patient will utilize external aids to recall new, daily information with Mod A multimodal cues.  SLP Short Term Goal 4 (Week 3): Patient will identify 2 physical and 2 cognitive changes with Mod A verbal cues.   Skilled Therapeutic Interventions: Skilled treatment session focused on cognitive goals. SLP facilitated session by providing overall Max A verbal cues for problem solving and organization during a familiar task of organizing a QD pill box. Patient's function was impacted by decreased mental flexibility with task. Patient left upright in bed with alarm on and all needs within reach. Continue with current plan of care.      Pain No/Denies Pain   Therapy/Group: Individual Therapy  Maureen Stout 10/06/2018, 1:04 PM

## 2018-10-06 NOTE — Progress Notes (Signed)
Slept fairly well throughout the night. Required In and Out cath x2, this shift. Voidded x2 this shift. Pleasant and cooperative, requiring frequent reorientation to place and situation. No c/o pain.

## 2018-10-06 NOTE — Plan of Care (Signed)
See care plan for details but some goals downgraded from supervision to min assist due to fluctuating levels of assist needed by patient.

## 2018-10-06 NOTE — Progress Notes (Signed)
Progress Note  Patient Name: Maureen Stout Date of Encounter: 10/06/2018  Primary Cardiologist: Shirlee More, MD   Subjective   Not feeling as well as yesterday, but denies dyspnea. BP relatively low and creatinine has jumped back up.  Inpatient Medications    Scheduled Meds: . amiodarone  100 mg Oral Daily  . amoxicillin  500 mg Oral QHS  . apixaban  2.5 mg Oral BID  . atorvastatin  10 mg Oral Daily  . calcium-vitamin D  1 tablet Oral BID  . feeding supplement (ENSURE ENLIVE)  237 mL Oral TID BM  . feeding supplement (PRO-STAT SUGAR FREE 64)  30 mL Oral BID  . furosemide  20 mg Oral BID  . Gerhardt's butt cream   Topical BID  . hydrALAZINE  37.5 mg Oral TID  . isosorbide dinitrate  10 mg Oral TID  . levothyroxine  75 mcg Oral Q0600  . mouth rinse  15 mL Mouth Rinse BID  . megestrol  400 mg Oral Daily  . polyethylene glycol  17 g Oral Daily  . sodium chloride flush  10-40 mL Intracatheter Q12H  . tamsulosin  0.4 mg Oral QPC supper   Continuous Infusions:  PRN Meds: acetaminophen, alum & mag hydroxide-simeth, bisacodyl, calcium carbonate, diphenhydrAMINE, guaiFENesin-dextromethorphan, ondansetron (ZOFRAN) IV **OR** ondansetron, polyethylene glycol, prochlorperazine **OR** prochlorperazine **OR** prochlorperazine, senna-docusate, sodium chloride flush, sodium phosphate   Vital Signs    Vitals:   10/05/18 2015 10/05/18 2047 10/06/18 0326 10/06/18 0530  BP: 118/62 (!) 106/50 (!) 92/46 (!) 102/54  Pulse: 81 74 83   Resp:  17 19   Temp:  98.4 F (36.9 C) 99.1 F (37.3 C)   TempSrc:  Oral Oral   SpO2:  100% 97%   Weight:   74.2 kg   Height:        Intake/Output Summary (Last 24 hours) at 10/06/2018 1002 Last data filed at 10/06/2018 0830 Gross per 24 hour  Intake 400 ml  Output 1350 ml  Net -950 ml   Last 3 Weights 10/06/2018 10/05/2018 10/04/2018  Weight (lbs) 163 lb 9.3 oz 164 lb 0.4 oz 168 lb 6.9 oz  Weight (kg) 74.2 kg 74.4 kg 76.4 kg      Telemetry     Non-tele  ECG    No new tracings - Personally Reviewed  Physical Exam  Elderly and frail GEN: No acute distress.   Neck: 4-5 cm JVD Cardiac: RRR, no murmurs, rubs, or gallops.  Respiratory: Clear to auscultation bilaterally. GI: Soft, nontender, non-distended  MS: No edema; No deformity. Neuro:  Nonfocal  Psych: Normal affect   Labs    Chemistry Recent Labs  Lab 09/30/18 0336  10/03/18 1047 10/04/18 0408 10/06/18 0601  NA 139   < > 141 140 139  K 4.3   < > 4.8 4.7 5.0  CL 104   < > 97* 96* 98  CO2 25   < > 32 33* 32  GLUCOSE 106*   < > 152* 105* 97  BUN 29*   < > 40* 39* 57*  CREATININE 1.58*   < > 2.00* 1.81* 2.42*  CALCIUM 9.1   < > 9.4 9.3 9.0  PROT 5.5*  --   --   --   --   ALBUMIN 2.6*  --   --   --   --   AST 46*  --   --   --   --   ALT 53*  --   --   --   --  ALKPHOS 142*  --   --   --   --   BILITOT 0.5  --   --   --   --   GFRNONAA 30*   < > 22* 25* 18*  GFRAA 34*   < > 26* 29* 21*  ANIONGAP 10   < > 12 11 9    < > = values in this interval not displayed.     Hematology Recent Labs  Lab 10/03/18 0335  WBC 5.5  RBC 2.45*  HGB 7.4*  HCT 24.3*  MCV 99.2  MCH 30.2  MCHC 30.5  RDW 18.6*  PLT 161    Cardiac EnzymesNo results for input(s): TROPONINI in the last 168 hours. No results for input(s): TROPIPOC in the last 168 hours.   BNPNo results for input(s): BNP, PROBNP in the last 168 hours.   DDimer No results for input(s): DDIMER in the last 168 hours.   Radiology    No results found.  Cardiac Studies   2D Echo 09/29/18 IMPRESSIONS  1. The left ventricle has moderate-severely reduced systolic function, with an ejection fraction of 30-35%. The cavity size was mildly dilated. Left ventricular diastolic Doppler parameters are consistent with pseudonormalization. Elevated left atrial  and left ventricular end-diastolic pressures Left ventricular diffuse hypokinesis. 2. The right ventricle has mildly reduced systolic function. The  cavity was mildly enlarged. There is no increase in right ventricular wall thickness. Right ventricular systolic pressure is severely elevated with an estimated pressure of 56.6 mmHg. 3. Left atrial size was mildly dilated. 4. Right atrial size was mildly dilated. 5. Large pleural effusion in the left lateral region. 6. Trivial pericardial effusion is present. 7. Tricuspid valve regurgitation is mild-moderate. 8. The aortic valve is tricuspid. Mild thickening of the aortic valve. Mild calcification of the aortic valve. Aortic valve regurgitation is mild by color flow Doppler. No stenosis of the aortic valve.  Patient Profile     85 y.o. female with a hx of paroxysmal atrial fibrillation, h/osinus bradycardia beta-blocker previously held, CKD stage III, dyslipidemia, hypertension, recent urosepsis and pancreatitis, seen in CIR  for the evaluation of new onset systolic heart failure.  Assessment & Plan    1. Acute combined Systolic and Diastolic CHF -EF 57% by echo with severe pulmonary hypertension. -Treated with a few dose of IV lasix, transitioned to oral dose, 40 mg BID.  -Weight is down 0.5lbs from yesterday. Down 30 lbs from peak wt.  -I&O has apparently not been accurate. Looks like 1.3L UOP yesterday.  -MD to do physical assessment for volume status.  -BP is soft but stable. 102/54 today.  -Medical therapy includes hydralazine/isordil, loop diuretic.  -SCr has bumped up today so will hold diuretic.   2. Atrial fibrillation -On amiodarone 100 mg daily -On Eliquis for stroke risk reduction.  - On non tele unit but based on vital sign checks, her pulse rates have been controlled in the 74-83.   3. Stage 3 CKD, acute injury -SCr up to 2.42 today with BUN of 57. Will hold diuretic.  4. Hypertension -With addition of hydralazine and isordil, BP soft but stable. Continue current medication and watch. Stopping lasix for now.   5. Anemia: may need transfusion. Hgb on 5/18 was  7.4. CBC not repeated. Will defer to primary team.  For questions or updates, please contact [place] Please consult [email or web address] for contact info under        Signed, Daune Perch, NP  10/06/2018, 10:02 AM  I have seen and examined the patient along with Daune Perch, NP .  I have reviewed the chart, notes and new data.  I agree with PA/NP's note.  Key new complaints: tired Key examination changes: BP on low side Key new findings / data: creat up to 2.4  PLAN: Stop diuretics until creatinine back to 1.5 baseline.  Sanda Klein, MD, [place] [phone] 10/06/2018, 11:08 AM

## 2018-10-06 NOTE — Progress Notes (Signed)
Family education done with daughter in law.  Explanation for I/O cath provided.  She was able to watch cath and answer questions. She will need additional education as well as performing I/O cath.

## 2018-10-06 NOTE — Plan of Care (Signed)
  Problem: RH SAFETY Goal: RH STG ADHERE TO SAFETY PRECAUTIONS W/ASSISTANCE/DEVICE Description STG Adhere to Safety Precautions With Min Assistance/Device.  Outcome: Progressing   Problem: RH PAIN MANAGEMENT Goal: RH STG PAIN MANAGED AT OR BELOW PT'S PAIN GOAL Description Patient will be pain free or pain less than 3 during admission  Outcome: Progressing   Problem: RH BLADDER ELIMINATION Goal: RH STG MANAGE BLADDER WITH ASSISTANCE Description STG Manage Bladder With Min Assistance  Outcome: Not Progressing

## 2018-10-07 ENCOUNTER — Inpatient Hospital Stay (HOSPITAL_COMMUNITY): Payer: Medicare PPO | Admitting: Speech Pathology

## 2018-10-07 ENCOUNTER — Inpatient Hospital Stay (HOSPITAL_COMMUNITY): Payer: Medicare PPO | Admitting: Occupational Therapy

## 2018-10-07 ENCOUNTER — Inpatient Hospital Stay (HOSPITAL_COMMUNITY): Payer: Medicare PPO

## 2018-10-07 DIAGNOSIS — I952 Hypotension due to drugs: Secondary | ICD-10-CM

## 2018-10-07 DIAGNOSIS — I4891 Unspecified atrial fibrillation: Secondary | ICD-10-CM

## 2018-10-07 DIAGNOSIS — D62 Acute posthemorrhagic anemia: Secondary | ICD-10-CM

## 2018-10-07 DIAGNOSIS — I10 Essential (primary) hypertension: Secondary | ICD-10-CM

## 2018-10-07 DIAGNOSIS — I5043 Acute on chronic combined systolic (congestive) and diastolic (congestive) heart failure: Secondary | ICD-10-CM

## 2018-10-07 LAB — BASIC METABOLIC PANEL
Anion gap: 10 (ref 5–15)
BUN: 69 mg/dL — ABNORMAL HIGH (ref 8–23)
CO2: 30 mmol/L (ref 22–32)
Calcium: 8.7 mg/dL — ABNORMAL LOW (ref 8.9–10.3)
Chloride: 97 mmol/L — ABNORMAL LOW (ref 98–111)
Creatinine, Ser: 2.6 mg/dL — ABNORMAL HIGH (ref 0.44–1.00)
GFR calc Af Amer: 19 mL/min — ABNORMAL LOW (ref >60)
GFR calc non Af Amer: 16 mL/min — ABNORMAL LOW (ref >60)
Glucose, Bld: 106 mg/dL — ABNORMAL HIGH (ref 70–99)
Potassium: 4.6 mmol/L (ref 3.5–5.1)
Sodium: 137 mmol/L (ref 135–145)

## 2018-10-07 LAB — PREPARE RBC (CROSSMATCH)

## 2018-10-07 LAB — CBC
HCT: 20.3 % — ABNORMAL LOW (ref 36.0–46.0)
HCT: 20.9 % — ABNORMAL LOW (ref 36.0–46.0)
Hemoglobin: 6.5 g/dL — CL (ref 12.0–15.0)
Hemoglobin: 6.6 g/dL — CL (ref 12.0–15.0)
MCH: 30.2 pg (ref 26.0–34.0)
MCH: 30.4 pg (ref 26.0–34.0)
MCHC: 32 g/dL (ref 30.0–36.0)
MCHC: 31.6 g/dL (ref 30.0–36.0)
MCV: 94.4 fL (ref 80.0–100.0)
MCV: 96.3 fL (ref 80.0–100.0)
Platelets: 154 10*3/uL (ref 150–400)
Platelets: 162 10*3/uL (ref 150–400)
RBC: 2.15 MIL/uL — ABNORMAL LOW (ref 3.87–5.11)
RBC: 2.17 MIL/uL — ABNORMAL LOW (ref 3.87–5.11)
RDW: 18 % — ABNORMAL HIGH (ref 11.5–15.5)
RDW: 18.2 % — ABNORMAL HIGH (ref 11.5–15.5)
WBC: 6 10*3/uL (ref 4.0–10.5)
WBC: 5.4 10*3/uL (ref 4.0–10.5)
nRBC: 0 % (ref 0.0–0.2)
nRBC: 0 % (ref 0.0–0.2)

## 2018-10-07 MED ORDER — ACETAMINOPHEN 325 MG PO TABS
650.0000 mg | ORAL_TABLET | Freq: Once | ORAL | Status: AC
  Administered 2018-10-07: 650 mg via ORAL
  Filled 2018-10-07: qty 2

## 2018-10-07 MED ORDER — FUROSEMIDE 10 MG/ML IJ SOLN
20.0000 mg | Freq: Once | INTRAMUSCULAR | Status: AC
  Administered 2018-10-07: 20 mg via INTRAVENOUS
  Filled 2018-10-07: qty 2

## 2018-10-07 MED ORDER — DIPHENHYDRAMINE HCL 25 MG PO CAPS
25.0000 mg | ORAL_CAPSULE | Freq: Once | ORAL | Status: AC
  Administered 2018-10-07: 25 mg via ORAL
  Filled 2018-10-07: qty 1

## 2018-10-07 MED ORDER — SODIUM CHLORIDE 0.9% IV SOLUTION
Freq: Once | INTRAVENOUS | Status: AC
  Administered 2018-10-07: 14:00:00 via INTRAVENOUS

## 2018-10-07 NOTE — Progress Notes (Addendum)
[facility] PHYSICAL MEDICINE & REHABILITATION PROGRESS NOTE  Subjective/Complaints: Patient seen laying in bed this morning.  She states she slept well overnight.  She slept fairly well per sleep chart.  She was seen by cards yesterday, notes reviewed, diuretics on hold.  ROS: Denies CP, shortness of breath, nausea, vomiting, diarrhea.   Objective: Vital Signs: Blood pressure (!) 113/50, pulse 81, temperature 98.6 F (37 C), temperature source Oral, resp. rate 12, height 5\' 6"  (1.676 m), weight 74.2 kg, SpO2 97 %. No results found. No results for input(s): WBC, HGB, HCT, PLT in the last 72 hours. Recent Labs    10/06/18 0601  NA 139  K 5.0  CL 98  CO2 32  GLUCOSE 97  BUN 57*  CREATININE 2.42*  CALCIUM 9.0    Physical Exam: BP (!) 113/50 (BP Location: Left Arm)   Pulse 81   Temp 98.6 F (37 C) (Oral)   Resp 12   Ht 5\' 6"  (1.676 m)   Wt 74.2 kg   SpO2 97%   BMI 26.40 kg/m  Constitutional: No distress . Vital signs reviewed. HENT: Normocephalic.  Atraumatic. Eyes: EOMI.  No discharge. Cardiovascular: No JVD. Respiratory: Normal effort.  + [place]. GI: Non-distended. Musc: Generalized edema, stable Neurological: Alert and oriented x2. Motor: Grossly 4-/5 throughout, stable Skin: Frail skin. Psychiatric: Flat  Assessment/Plan: 1. Functional deficits secondary to debility and encephalopathy which require 3+ hours per day of interdisciplinary therapy in a comprehensive inpatient rehab setting.  Physiatrist is providing close team supervision and 24 hour management of active medical problems listed below.  Physiatrist and rehab team continue to assess barriers to discharge/monitor patient progress toward functional and medical goals  Care Tool:  Bathing  Bathing activity did not occur: Refused Body parts bathed by patient: Right arm, Left arm, Chest, Abdomen, Face   Body parts bathed by helper: Front perineal area, Buttocks, Right upper leg, Left upper leg, Right  lower leg, Left lower leg     Bathing assist Assist Level: Maximal Assistance - Patient 24 - 49%     Upper Body Dressing/Undressing Upper body dressing Upper body dressing/undressing activity did not occur (including orthotics): Refused What is the patient wearing?: Pull over shirt    Upper body assist Assist Level: Moderate Assistance - Patient 50 - 74%    Lower Body Dressing/Undressing Lower body dressing    Lower body dressing activity did not occur: Refused What is the patient wearing?: Pants, Incontinence brief     Lower body assist Assist for lower body dressing: Total Assistance - Patient < 25%     Toileting Toileting Toileting Activity did not occur Landscape architect and hygiene only): Safety/medical concerns  Toileting assist Assist for toileting: Total Assistance - Patient < 25%     Transfers Chair/bed transfer  Transfers assist     Chair/bed transfer assist level: Contact Guard/Touching assist     Locomotion Ambulation   Ambulation assist      Assist level: Minimal Assistance - Patient > 75% Assistive device: Walker-rolling Max distance: 5'   Walk 10 feet activity   Assist  Walk 10 feet activity did not occur: Safety/medical concerns  Assist level: Minimal Assistance - Patient > 75% Assistive device: Walker-rolling   Walk 50 feet activity   Assist Walk 50 feet with 2 turns activity did not occur: Safety/medical concerns         Walk 150 feet activity   Assist Walk 150 feet activity did not occur: Safety/medical concerns  Walk 10 feet on uneven surface  activity   Assist Walk 10 feet on uneven surfaces activity did not occur: Safety/medical concerns         Wheelchair     Assist Will patient use wheelchair at discharge?: No Type of Wheelchair: Manual    Wheelchair assist level: Maximal Assistance - Patient 25 - 49% Max wheelchair distance: 10    Wheelchair 50 feet with 2 turns  activity    Assist            Wheelchair 150 feet activity     Assist            Medical Problem List and Plan: 1.Functional deficits and weaknesssecondary to debility and encephalopathy after urosepsis/pancreatitis and multiple medical issues  Cont CIR, 15/7  Thyroid function within normal limits 2. Antithrombotics: -PAF/DVT/anticoagulation:Pharmaceutical:Other (comment)- Eliquis 3. Pain Management:Tylenol prn. 4. Mood:LCSW to follow for evaluation and support. -antipsychotic agents: N/A 5. Neuropsych: This patientisnot fully capable of making decisions on herown behalf. 6. Skin/Wound Care:Routine pressure relief measures. 7. Fluids/Electrolytes/Nutrition:Strict I/O.   PO intake gradually improving  Megace initiated  IVF d/ced  Barium swallow showing mild esophageal dysmotility 8. Enterococcus bacteremia/UTI: Completed 14-day course of Maxipime   Amoxicillin 500 mg nightly started on 5/7 9. Anasarca/FLuid overload: Added heart healthy restrictions. Weight daily. Lasix daily prn depending stability of weights/symptoms.  Filed Weights   10/04/18 0559 10/05/18 0356 10/06/18 0326  Weight: 76.4 kg 74.4 kg 74.2 kg   Stable on 5/22 10. PAF: Monitor HR bid--back on amiodarone and Eliquis. Off metoprolol due to bradycardia. 11. Urinary retention with recurrent UTIs:   Bladder prolapse  Amoxicillin 500 mg nightly started on 5/7 per IM  Repeat urine culture with yeast-Diflucan x1 completed  Cath for volumes > 350 cc.  Repeat UA/urine culture ordered  Urecholine started 12. Acute on Chronic renal failure: SCr- 1.32 on 04/2018. Continue to monitor with routine checks.   Creatinine at 2.42 on 5/21, labs pending  Lasix decreased on 5/21, on hold per cards  Encourage fluids 13. Delirium: Resolving.  14. Anemia of chronic disease: Baseline Hg around 9.0? Likely dropafterhydration.   Monitor for signs of bleeding.   Hemoglobin 7.4 on  5/18, labs pending.  Critical value called in.  Will transfuse.  Continue to monitor 15. Acute pancreatitis: Offer supplements with meals.   LFTs elevated, but improving on 5/15  Amylase/lipase within normal limits  Ammonia WNL  Continue to monitor 16. Hypothyroidism: Now synthroid 75 mcg/day. 77.  Hypertension  Chest x-ray reviewed, showing cardiomegaly.  BNP elevated.  See #20  Cards adjusting medications  Stopped on 5/22, diuretics on hold 18.  Hypokalemia  Potassium 5.0 on 5/21, labs pending  Supplement increased on 5/7, decreased on 5/12, decreased again on 5/13, DC'd on 5/21  IV supplement ordered on 5/8 19. Hypotheramia: Resolved  Did not require bear hugger last night, cont to monitor 20.  Acute combined CHF  CXR showing cardiomegaly  BNP elevated  Echo 2018 reviewed, showing EF 60-65%, repeat Echo showing significant decrease in ejection fraction of 76-19% with diastolic heart failure as well.   Not a candidate for additional procedures at this time, medications being adjusted by cards-appreciate recs  ECG reviewed, stable 21. Esophageal Dysmotility  Per barium swallow  Per GI monitor now that NG tube has been removed. 22. Prediabtes  Labile blood glucose on 5/19  LOS: 18 days A FACE TO FACE EVALUATION WAS PERFORMED  Ankit [name] 10/07/2018, 8:59 AM

## 2018-10-07 NOTE — Progress Notes (Addendum)
Occupational Therapy Session Note  Patient Details  Name: Maureen Stout MRN: [id number] Date of Birth: 11/25/1931  Today's Date: 10/07/2018 OT Individual Time: 0925-1006 OT Individual Time Calculation (min): 41 min    Short Term Goals: Week 1:  OT Short Term Goal 1 (Week 1): Pt will complete bathing at sit > stand level with min assist OT Short Term Goal 1 - Progress (Week 1): Progressing toward goal OT Short Term Goal 2 (Week 1): Pt will complete toilet transfers with min assist  OT Short Term Goal 2 - Progress (Week 1): Progressing toward goal OT Short Term Goal 3 (Week 1): Pt will complete LB dressing with min assist OT Short Term Goal 3 - Progress (Week 1): Progressing toward goal OT Short Term Goal 4 (Week 1): Pt will maintain sitting balance at EOB 5 mins with supervision to engage in self-care tasks OT Short Term Goal 4 - Progress (Week 1): Met Week 2:  OT Short Term Goal 1 (Week 2): Pt will complete bathing at sit > stand level with min assist OT Short Term Goal 2 (Week 2): Pt will complete toilet transfers with min assist  OT Short Term Goal 3 (Week 2): Pt will complete LB dressing with min assist      Skilled Therapeutic Interventions/Progress Updates:    Pt received in bed finishing receiving meds from nursing. Nursing wants to have pt to try to toilet, if no void then they will cath her. Pt has slow processing and needs considerable cues for initiating activity and movement.  Pt did sit to EOB and stood to RW with only min A, but she then needed to sit down right away. She stood again but could not sequence how to move her feet and within seconds needed to sit, so did a squat pivot to wC instead with min-mod A as once she transferred she only sat on L side of cushion and was leaning over to her R .  Pt needed MAX cues to lean forward to readjust her hips with mod A.  Pt taken into bathroom to use bars to transfer to toilet with mod A (min to stand but mod to sequence to transfer).   Pt sat on toilet and had a small bowel movement but no urine.  It took considerable time to complete cleansing, changing her brief and donning her pants as pt would only tolerate standing for 2-3 seconds at a time.  She did wt shift to her L with min A to support balance to do some of the self cleansing, but it ended up getting on her thigh and the toilet seat so therapist completed cleansing.   At end of session, nurse just received critical lab values of low hemoglobin.  Pt resting in w/c with belt alarm on and all needs met.  Nursing arrived to room.    LTGs initially set at S.  Goals have been downgraded to min A for mobility, mod A LB dressing, and max toileting due to decreased processing skills, poor standing endurance.  Therapy Documentation Precautions:  Precautions Precautions: Fall Restrictions Weight Bearing Restrictions: No   Pain: Pain Assessment Pain Score: 0-No pain        Therapy/Group: Individual Therapy  [place] 10/07/2018, 10:50 AM

## 2018-10-07 NOTE — Progress Notes (Signed)
Physical Therapy Discharge Summary  Patient Details  Name: Maureen Stout MRN: [id number] Date of Birth: 03/11/1932  Today's Date: 10/09/2018    Patient has met 8 of 8 long term goals due to improved activity tolerance, improved balance, improved postural control, increased strength and ability to compensate for deficits.  Patient to discharge at w/c level and short distance gait with RW level supervision to min assist. Pt's level of assist seems to fluctuate pending fatigue level but overall at min assist level. Patient's care partner is independent to provide the necessary physical and cognitive assistance at discharge. Pt's son and daughter in law came in for family education training session prior to d/c.  Reasons goals not met: N/A  Recommendation:  Patient will benefit from ongoing skilled PT services in home health setting to continue to advance safe functional mobility, address ongoing impairments in strength, balance, cognition, endurance, functional mobility, and minimize fall risk.  Equipment: No equipment provided. Pt already has RW and son to purchase w/c.   Reasons for discharge: treatment goals met  Patient/family agrees with progress made and goals achieved: Yes  PT Discharge Precautions/Restrictions Precautions Precautions: Fall Restrictions Weight Bearing Restrictions: No Pain Pain Assessment Pain Scale: 0-10 Vision/Perception  Perception Perception: Within Functional Limits Praxis Praxis: Intact  Cognition Overall Cognitive Status: Within Functional Limits for tasks assessed Arousal/Alertness: Awake/alert Orientation Level: Oriented to person;Oriented to place;Oriented to time;Disoriented to situation Attention: Sustained Sustained Attention: Appears intact Memory: Impaired Awareness: Impaired Awareness Impairment: Intellectual impairment Problem Solving: Impaired Safety/Judgment: Impaired Sensation Coordination Gross Motor Movements are Fluid and  Coordinated: No Fine Motor Movements are Fluid and Coordinated: No Motor  Motor Motor: Abnormal postural alignment and control Motor - Discharge Observations: generalized debility and weakness  Mobility Transfers Transfers: Sit to Stand;Stand to Sit;Stand Pivot Transfers Sit to Stand: Contact Guard/Touching assist Stand to Sit: Contact Guard/Touching assist Stand Pivot Transfers: Contact Guard/Touching assist Transfer (Assistive device): Rolling walker Locomotion  Gait Ambulation: Yes Gait Assistance: Contact Guard/Touching assist;Minimal Assistance - Patient > 75% Gait Distance (Feet): 20 Feet Assistive device: Rolling walker Gait Assistance Details: Verbal cues for sequencing;Verbal cues for gait pattern;Tactile cues for posture  Trunk/Postural Assessment  Cervical Assessment Cervical Assessment: Exceptions to WFL(forward head) Thoracic Assessment Thoracic Assessment: Exceptions to WFL(kyphosis) Lumbar Assessment Lumbar Assessment: Exceptions to WFL(posterior pelvic tilt) Postural Control Postural Control: Deficits on evaluation Righting Reactions: delayed  Balance Balance Balance Assessed: Yes Static Sitting Balance Static Sitting - Level of Assistance: 5: Stand by assistance Dynamic Sitting Balance Dynamic Sitting - Level of Assistance: 5: Stand by assistance Static Standing Balance Static Standing - Level of Assistance: 5: Stand by assistance Dynamic Standing Balance Dynamic Standing - Level of Assistance: 5: Stand by assistance Extremity Assessment      RLE Assessment RLE Assessment: Within Functional Limits General Strength Comments: grossly 4/5 LLE Assessment LLE Assessment: Within Functional Limits General Strength Comments: grossly 4/5   Yury Schaus PT, DPT  10/07/2018, 3:38 PM

## 2018-10-07 NOTE — Progress Notes (Signed)
Blood ready, pt in therapy

## 2018-10-07 NOTE — Progress Notes (Signed)
Physical Therapy Session Note  Patient Details  Name: Maureen Stout MRN: [id number] Date of Birth: 12/21/1931  Today's Date: 10/07/2018 PT Individual Time: 1300-1328 PT Individual Time Calculation (min): 28 min   Short Term Goals: Week 2:  PT Short Term Goal 1 (Week 2): pt will consistently perform transfers at min A PT Short Term Goal 1 - Progress (Week 2): Met PT Short Term Goal 2 (Week 2): pt will perform gait wtih min A x 15' in controlled environment PT Short Term Goal 2 - Progress (Week 2): Met Week 3:  PT Short Term Goal 1 (Week 3): = LTGs at min assist level (downgraded due to fluctuating levels)  Skilled Therapeutic Interventions/Progress Updates:    Pt with low Hg and going to get a unit of blood. Pt asymptomatic and per RN ok for mobility to tolerance. Pt denies any dizzines or fatigue with mobility. Assessed for O2 need during activity. Pt engaged sit <> stand x 3 reps with CGA and verbal cues for hand placement with RW and O2 remained = 97% and pt asymptomatic. Pt request to use bathroom. Functional gait with RW x 20' with CGA to min assist in and out of bathroom including navigating through doorway. Pt performed several sit <> stands with CGA (once with Supervision) for clothing management and hygiene (PT checked for thoroughness but pt completed hygiene herself). After returned to w/c with gait on second time, room air = 95% and pt continues to be asymptomatic. RN made aware.   Therapy Documentation Precautions:  Precautions Precautions: Fall Restrictions Weight Bearing Restrictions: No  Pain: Pain Assessment Pain Score: 0-No pain   Therapy/Group: Individual Therapy  Maureen Stout, PT, DPT, CBIS  10/07/2018, 1:35 PM

## 2018-10-07 NOTE — Progress Notes (Signed)
Slept for 6 hours this shift.Awakening several times throguhout the night. Pt incontinent of urine x1, with voiding noted x2. Pt required in and out cath x2 due to bladder scan volume above 350 ml. No prn medications given.

## 2018-10-07 NOTE — Progress Notes (Signed)
Progress Note  Patient Name: Maureen Stout Date of Encounter: 10/07/2018  Primary Cardiologist: Shirlee More, MD   Subjective   Feels well - no dyspnea.  Inpatient Medications    Scheduled Meds: . amiodarone  100 mg Oral Daily  . amoxicillin  500 mg Oral QHS  . apixaban  2.5 mg Oral BID  . atorvastatin  10 mg Oral Daily  . calcium-vitamin D  1 tablet Oral BID  . feeding supplement (ENSURE ENLIVE)  237 mL Oral TID BM  . feeding supplement (PRO-STAT SUGAR FREE 64)  30 mL Oral BID  . Gerhardt's butt cream   Topical BID  . hydrALAZINE  37.5 mg Oral TID  . isosorbide dinitrate  10 mg Oral TID  . levothyroxine  75 mcg Oral Q0600  . mouth rinse  15 mL Mouth Rinse BID  . megestrol  400 mg Oral Daily  . polyethylene glycol  17 g Oral Daily  . sodium chloride flush  10-40 mL Intracatheter Q12H  . tamsulosin  0.4 mg Oral QPC supper   Continuous Infusions:  PRN Meds: acetaminophen, alum & mag hydroxide-simeth, bisacodyl, calcium carbonate, diphenhydrAMINE, guaiFENesin-dextromethorphan, ondansetron (ZOFRAN) IV **OR** ondansetron, polyethylene glycol, prochlorperazine **OR** prochlorperazine **OR** prochlorperazine, senna-docusate, sodium chloride flush, sodium phosphate   Vital Signs    Vitals:   10/06/18 1447 10/06/18 1930 10/06/18 1932 10/07/18 0342  BP: (!) 104/48 (!) 110/45 (!) 96/50 (!) 113/50  Pulse: 75 81  81  Resp: 18 18  12   Temp:  97.8 F (36.6 C)  98.6 F (37 C)  TempSrc:    Oral  SpO2:  100%  97%  Weight:      Height:        Intake/Output Summary (Last 24 hours) at 10/07/2018 0812 Last data filed at 10/07/2018 0536 Gross per 24 hour  Intake 490 ml  Output 1300 ml  Net -810 ml   Last 3 Weights 10/06/2018 10/05/2018 10/04/2018  Weight (lbs) 163 lb 9.3 oz 164 lb 0.4 oz 168 lb 6.9 oz  Weight (kg) 74.2 kg 74.4 kg 76.4 kg      Telemetry    N/A  ECG    No recent tracing - Personally Reviewed  Physical Exam   GEN: No acute distress.   Neck: 4 cm JVD  Cardiac: RRR, no murmurs, rubs, or gallops.  Respiratory: Clear to auscultation bilaterally. GI: Soft, nontender, non-distended  MS: 1 + ankle edema; No deformity. Neuro:  Nonfocal  Psych: Normal affect   Labs    Chemistry Recent Labs  Lab 10/03/18 1047 10/04/18 0408 10/06/18 0601  NA 141 140 139  K 4.8 4.7 5.0  CL 97* 96* 98  CO2 32 33* 32  GLUCOSE 152* 105* 97  BUN 40* 39* 57*  CREATININE 2.00* 1.81* 2.42*  CALCIUM 9.4 9.3 9.0  GFRNONAA 22* 25* 18*  GFRAA 26* 29* 21*  ANIONGAP 12 11 9      Hematology Recent Labs  Lab 10/03/18 0335  WBC 5.5  RBC 2.45*  HGB 7.4*  HCT 24.3*  MCV 99.2  MCH 30.2  MCHC 30.5  RDW 18.6*  PLT 161    Cardiac EnzymesNo results for input(s): TROPONINI in the last 168 hours. No results for input(s): TROPIPOC in the last 168 hours.   BNPNo results for input(s): BNP, PROBNP in the last 168 hours.   DDimer No results for input(s): DDIMER in the last 168 hours.   Radiology    No results found.  Cardiac Studies  2D Echo 09/29/18 IMPRESSIONS  1. The left ventricle has moderate-severely reduced systolic function, with an ejection fraction of 30-35%. The cavity size was mildly dilated. Left ventricular diastolic Doppler parameters are consistent with pseudonormalization. Elevated left atrial  and left ventricular end-diastolic pressures Left ventricular diffuse hypokinesis. 2. The right ventricle has mildly reduced systolic function. The cavity was mildly enlarged. There is no increase in right ventricular wall thickness. Right ventricular systolic pressure is severely elevated with an estimated pressure of 56.6 mmHg. 3. Left atrial size was mildly dilated. 4. Right atrial size was mildly dilated. 5. Large pleural effusion in the left lateral region. 6. Trivial pericardial effusion is present. 7. Tricuspid valve regurgitation is mild-moderate. 8. The aortic valve is tricuspid. Mild thickening of the aortic valve. Mild  calcification of the aortic valve. Aortic valve regurgitation is mild by color flow Doppler. No stenosis of the aortic valve.  Patient Profile     85 y.o. female with a hx of paroxysmal atrial fibrillation, h/osinus bradycardia beta-blocker previously held, CKD stage III, dyslipidemia, hypertension, recent urosepsis and pancreatitis, seen in CIR  for the evaluation of new onset systolic heart failure.  Assessment & Plan    1. Acute combined systolic and diastolic CHF -EF 89% by echo with severe pulmonary hypertension. -Treated with a few dose of IV lasix, transitioned to oral dose, 40 mg BID.  -Weight down ~30 lbs from peak wt.  -MD to do physical assessment for volume status.  -BP is soft but stable. 113/50 today.  -Medical therapy includes hydralazine/isordil, loop diuretic (on hold until renal function improves).   2. Atrial fibrillation -On amiodarone 100 mg daily -On Eliquis for stroke risk reduction.  - On non tele unit but based on vital sign checks, her pulse rates have been controlled in the75-83.   3. Stage 3 CKD, acute injury -SCr up to 2.42 yesterday with BUN of 57. Diuretic on hold. No lab done today, I have ordered BMet. -Would resume lasix at 40 mg daily (down from BID) once SCr back to 1.5.   4. Hypertension -With addition of hydralazine and isordil, BP soft but stable. Continue current medication and watch. Stopping lasix for now.   5. Anemia  may need transfusion. Hgbon 5/18was 7.4. CBC not repeated.Will obtain CBC with today's lab draw. Will defer treatment to primary team.   For questions or updates, please contact [place] Please consult [email or web address] for contact info under      Signed, Daune Perch, NP  10/07/2018, 8:12 AM    I have seen and examined the patient along with Daune Perch, NP .  I have reviewed the chart, notes and new data.  I agree with NP's note.  Key new complaints: looks comfortable, denies angina or dyspnea, but  feels "something's not quite right" Key examination changes: clear lungs, persistent mild ankle swelling, but no other signs of hypevolemia Key new findings / data: creatinine slightly worse at 2.60, markedly anemic at 6.6  PLAN: Will benefit from 1 u PRBCs. Will keep off diuretics until renal parameters improve.  Sanda Klein, MD, [place] [phone] 10/07/2018, 5:56 PM

## 2018-10-07 NOTE — Progress Notes (Signed)
Social Work Patient ID: Maureen Stout, female   DOB: [birth year], 85 y.o.   MRN: [id number] Spoke with karen-RN and [name]-PT who both have checked pt's O2 level and she is doing well with 94% and 92%. Pt does not need home O2 and will let her son know. Pt getting blood today. Still working toward discharge for Monday.

## 2018-10-07 NOTE — Progress Notes (Signed)
Critical lab value Hbg 6.5, called P.Love, PA, notified of value. O2 sat 94% RA. Pt sitting in w/c at this time. No new orders rec'd at this time.  Do need to see O2 with activity and PA will reorder lab to see if Hbg value changes.

## 2018-10-07 NOTE — Progress Notes (Signed)
Speech Language Pathology Daily Session Note  Patient Details  Name: Maureen Stout MRN: [id number] Date of Birth: 1932-02-23  Today's Date: 10/07/2018 SLP Individual Time: 1115-1155 SLP Individual Time Calculation (min): 40 min  Short Term Goals: Week 3: SLP Short Term Goal 1 (Week 3): Patient will demonstrate sustained attention to functional tasks for 30 minutes with Mod A verbal cues for redirection SLP Short Term Goal 2 (Week 3): Patient will demonstrate functional problem solving for basic and familiar tasks with Mod A verbal cues.  SLP Short Term Goal 3 (Week 3): Patient will utilize external aids to recall new, daily information with Mod A multimodal cues.  SLP Short Term Goal 4 (Week 3): Patient will identify 2 physical and 2 cognitive changes with Mod A verbal cues.   Skilled Therapeutic Interventions: Skilled treatment session focused on cognitive-linguistic goals. SLP facilitated session by providing overall Min A verbal cues for recall of functional information in regards to d/c planning and events from previous therapy sessions. Attempted to engage in structured task, however, patient demonstrated difficulty with task due to decreased mental flexibility. Patient participated in a functional conversation that focused on anticipatory awareness with overall Mod A verbal cue. Throughout session, patient required extra time for communication and was overall supervision for word-finding at the conversation level. Patient appeared much brighter, alert and oriented this session. Patient left upright in bed wheelchair with alarm on and all needs within reach. Continue with current plan of care.      Pain Pain Assessment Pain Score: 0-No pain  Therapy/Group: Individual Therapy  Tallie Dodds 10/07/2018, 12:28 PM

## 2018-10-07 NOTE — Plan of Care (Signed)
  Problem: RH SAFETY Goal: RH STG ADHERE TO SAFETY PRECAUTIONS W/ASSISTANCE/DEVICE Description STG Adhere to Safety Precautions With Min Assistance/Device.  Outcome: Progressing   Problem: RH PAIN MANAGEMENT Goal: RH STG PAIN MANAGED AT OR BELOW PT'S PAIN GOAL Description Patient will be pain free or pain less than 3 during admission  Outcome: Progressing

## 2018-10-08 ENCOUNTER — Inpatient Hospital Stay (HOSPITAL_COMMUNITY): Payer: Medicare PPO | Admitting: Occupational Therapy

## 2018-10-08 ENCOUNTER — Inpatient Hospital Stay (HOSPITAL_COMMUNITY): Payer: Medicare PPO | Admitting: Speech Pathology

## 2018-10-08 ENCOUNTER — Inpatient Hospital Stay (HOSPITAL_COMMUNITY): Payer: Medicare PPO | Admitting: Physical Therapy

## 2018-10-08 LAB — BPAM RBC
Blood Product Expiration Date: 202005272359
ISSUE DATE / TIME: 202005220831
Unit Type and Rh: 5100

## 2018-10-08 LAB — BASIC METABOLIC PANEL
Anion gap: 15 (ref 5–15)
BUN: 79 mg/dL — ABNORMAL HIGH (ref 8–23)
CO2: 25 mmol/L (ref 22–32)
Calcium: 8.8 mg/dL — ABNORMAL LOW (ref 8.9–10.3)
Chloride: 98 mmol/L (ref 98–111)
Creatinine, Ser: 2.73 mg/dL — ABNORMAL HIGH (ref 0.44–1.00)
GFR calc Af Amer: 18 mL/min — ABNORMAL LOW (ref >60)
GFR calc non Af Amer: 15 mL/min — ABNORMAL LOW (ref >60)
Glucose, Bld: 112 mg/dL — ABNORMAL HIGH (ref 70–99)
Potassium: 5.7 mmol/L — ABNORMAL HIGH (ref 3.5–5.1)
Sodium: 138 mmol/L (ref 135–145)

## 2018-10-08 LAB — TYPE AND SCREEN
ABO/RH(D): O POS
Antibody Screen: NEGATIVE
Unit division: 0

## 2018-10-08 MED ORDER — HYDRALAZINE HCL 25 MG PO TABS
25.0000 mg | ORAL_TABLET | Freq: Three times a day (TID) | ORAL | Status: DC
  Administered 2018-10-08 – 2018-10-09 (×5): 25 mg via ORAL
  Filled 2018-10-08 (×6): qty 1

## 2018-10-08 MED ORDER — SODIUM CHLORIDE 0.9 % IV SOLN
INTRAVENOUS | Status: DC
  Administered 2018-10-08 (×2): via INTRAVENOUS

## 2018-10-08 MED ORDER — SODIUM POLYSTYRENE SULFONATE 15 GM/60ML PO SUSP
15.0000 g | Freq: Once | ORAL | Status: AC
  Administered 2018-10-08: 15 g via ORAL
  Filled 2018-10-08: qty 60

## 2018-10-08 NOTE — Progress Notes (Signed)
PHYSICAL MEDICINE & REHABILITATION PROGRESS NOTE  Subjective/Complaints: Patient without new issues  ROS: Denies CP, shortness of breath, nausea, vomiting, diarrhea.   Objective: Vital Signs: Blood pressure (!) 123/47, pulse 72, temperature 99 F (37.2 C), temperature source Oral, resp. rate 14, height 5\' 6"  (1.676 m), weight 74.7 kg, SpO2 92 %. No results found. Recent Labs    10/07/18 0838 10/07/18 1039  WBC 6.0 5.4  HGB 6.5* 6.6*  HCT 20.3* 20.9*  PLT 154 162   Recent Labs    10/07/18 0838 10/08/18 0528  NA 137 138  K 4.6 5.7*  CL 97* 98  CO2 30 25  GLUCOSE 106* 112*  BUN 69* 79*  CREATININE 2.60* 2.73*  CALCIUM 8.7* 8.8*    Physical Exam: BP (!) 123/47 (BP Location: Left Arm)   Pulse 72   Temp 99 F (37.2 C) (Oral)   Resp 14   Ht 5\' 6"  (1.676 m)   Wt 74.7 kg   SpO2 92%   BMI 26.58 kg/m  Constitutional: No distress . Vital signs reviewed. HENT: Normocephalic.  Atraumatic. Eyes: EOMI.  No discharge. Cardiovascular: No JVD. Respiratory: Normal effort.  + [place]. GI: Non-distended. Musc: Generalized edema, stable Neurological: Alert and oriented x2. Motor: Grossly 4-/5 throughout, stable Skin: Frail skin. Psychiatric: Flat  Assessment/Plan: 1. Functional deficits secondary to debility and encephalopathy which require 3+ hours per day of interdisciplinary therapy in a comprehensive inpatient rehab setting.  Physiatrist is providing close team supervision and 24 hour management of active medical problems listed below.  Physiatrist and rehab team continue to assess barriers to discharge/monitor patient progress toward functional and medical goals  Care Tool:  Bathing  Bathing activity did not occur: Refused Body parts bathed by patient: Right arm, Left arm, Chest, Abdomen, Face   Body parts bathed by helper: Front perineal area, Buttocks, Right upper leg, Left upper leg, Right lower leg, Left lower leg     Bathing assist Assist Level:  Maximal Assistance - Patient 24 - 49%     Upper Body Dressing/Undressing Upper body dressing Upper body dressing/undressing activity did not occur (including orthotics): Refused What is the patient wearing?: Pull over shirt    Upper body assist Assist Level: Moderate Assistance - Patient 50 - 74%    Lower Body Dressing/Undressing Lower body dressing    Lower body dressing activity did not occur: Refused What is the patient wearing?: Pants, Incontinence brief     Lower body assist Assist for lower body dressing: Total Assistance - Patient < 25%     Toileting Toileting Toileting Activity did not occur Landscape architect and hygiene only): Safety/medical concerns  Toileting assist Assist for toileting: Total Assistance - Patient < 25%     Transfers Chair/bed transfer  Transfers assist     Chair/bed transfer assist level: Contact Guard/Touching assist     Locomotion Ambulation   Ambulation assist      Assist level: Minimal Assistance - Patient > 75% Assistive device: Walker-rolling Max distance: 20'   Walk 10 feet activity   Assist  Walk 10 feet activity did not occur: Safety/medical concerns  Assist level: Minimal Assistance - Patient > 75% Assistive device: Walker-rolling   Walk 50 feet activity   Assist Walk 50 feet with 2 turns activity did not occur: Safety/medical concerns         Walk 150 feet activity   Assist Walk 150 feet activity did not occur: Safety/medical concerns  Walk 10 feet on uneven surface  activity   Assist Walk 10 feet on uneven surfaces activity did not occur: Safety/medical concerns         Wheelchair     Assist Will patient use wheelchair at discharge?: No Type of Wheelchair: Manual    Wheelchair assist level: Maximal Assistance - Patient 25 - 49% Max wheelchair distance: 10    Wheelchair 50 feet with 2 turns activity    Assist            Wheelchair 150 feet activity      Assist            Medical Problem List and Plan: 1.Functional deficits and weaknesssecondary to debility and encephalopathy after urosepsis/pancreatitis and multiple medical issues  Cont CIR, 15/7  Thyroid function within normal limits 2. Antithrombotics: -PAF/DVT/anticoagulation:Pharmaceutical:Other (comment)- Eliquis 3. Pain Management:Tylenol prn. 4. Mood:LCSW to follow for evaluation and support. -antipsychotic agents: N/A 5. Neuropsych: This patientisnot fully capable of making decisions on herown behalf. 6. Skin/Wound Care:Routine pressure relief measures. 7. Fluids/Electrolytes/Nutrition:Strict I/O.   PO intake gradually improving  Megace initiated  IVF d/ced  Barium swallow showing mild esophageal dysmotility 8. Enterococcus bacteremia/UTI: Completed 14-day course of Maxipime   Amoxicillin 500 mg nightly started on 5/7 9. Anasarca/FLuid overload: Added heart healthy restrictions. Weight daily. Lasix daily prn depending stability of weights/symptoms.  Filed Weights   10/05/18 0356 10/06/18 0326 10/08/18 0607  Weight: 74.4 kg 74.2 kg 74.7 kg   Stable on 5/23 10. PAF: Monitor HR bid--back on amiodarone and Eliquis. Off metoprolol due to bradycardia. 11. Urinary retention with recurrent UTIs:   Bladder prolapse  Amoxicillin 500 mg nightly started on 5/7 per IM  Repeat urine culture with yeast-Diflucan x1 completed  Cath for volumes > 350 cc.  Repeat UA/urine culture ordered  Urecholine started 12. Acute on Chronic renal failure: SCr- 1.32 on 04/2018. Continue to monitor with routine checks.   Creatinine at 2.42 on 5/21, labs pending  Lasix decreased on 5/21, on hold per cards  Encourage fluids 13. Delirium: Resolving.  14. Anemia of chronic disease: Baseline Hg around 9.0? Likely dropafterhydration.   Monitor for signs of bleeding.   Hemoglobin 6.6 s/p Transfusion 5/22  Continue to monitor 15. Acute pancreatitis: Offer  supplements with meals.   LFTs elevated, but improving on 5/15  Amylase/lipase within normal limits  Ammonia WNL  Continue to monitor 16. Hypothyroidism: Now synthroid 75 mcg/day. 84.  Hypertension  Chest x-ray reviewed, showing cardiomegaly.  BNP elevated.  See #20  Cards adjusting medications  Stopped on 5/22, diuretics on hold Vitals:   10/07/18 2011 10/08/18 0607  BP: (!) 94/52 (!) 123/47  Pulse:  72  Resp:  14  Temp:  99 F (37.2 C)  SpO2:  92%   18.  Hypokalemia, now hyperkalemic  Potassium 5.0 on 5/21, up to 5.7 on 5/23  Supplement increased on 5/7, decreased on 5/12, decreased again on 5/13, DC'd on 5/21  If additional IV lasix is planned will not use kayexalate, will discuss with Cards 19. Hypotheramia: Resolved  Did not require bear hugger last night, cont to monitor 20.  Acute combined CHF  CXR showing cardiomegaly  BNP elevated  Echo 2018 reviewed, showing EF 60-65%, repeat Echo showing significant decrease in ejection fraction of 41-66% with diastolic heart failure as well.   Not a candidate for additional procedures at this time, medications being adjusted by cards-appreciate recs  ECG reviewed, stable 21. Esophageal Dysmotility  Per barium swallow  Per GI monitor now that NG tube has been removed. 22. Prediabtes  Labile blood glucose on 5/19  LOS: 19 days A FACE TO [place] E Edouard Gikas 10/08/2018, 9:07 AM

## 2018-10-08 NOTE — Progress Notes (Signed)
Progress Note  Patient Name: Maureen Stout Date of Encounter: 10/08/2018  Primary Cardiologist: Shirlee More, MD   Subjective   Denies dyspnea, persistent ankle edema. Worsening renal function and mild hyperkalemia after blood transfusion. (she also rec'd 20 mg IV furosemide). Weight steady for last 3 days.  Inpatient Medications    Scheduled Meds:  amiodarone  100 mg Oral Daily   amoxicillin  500 mg Oral QHS   apixaban  2.5 mg Oral BID   atorvastatin  10 mg Oral Daily   calcium-vitamin D  1 tablet Oral BID   feeding supplement (ENSURE ENLIVE)  237 mL Oral TID BM   feeding supplement (PRO-STAT SUGAR FREE 64)  30 mL Oral BID   Gerhardt's butt cream   Topical BID   hydrALAZINE  37.5 mg Oral TID   isosorbide dinitrate  10 mg Oral TID   levothyroxine  75 mcg Oral Q0600   mouth rinse  15 mL Mouth Rinse BID   megestrol  400 mg Oral Daily   polyethylene glycol  17 g Oral Daily   sodium chloride flush  10-40 mL Intracatheter Q12H   sodium polystyrene  15 g Oral Once   tamsulosin  0.4 mg Oral QPC supper   Continuous Infusions:  sodium chloride     PRN Meds: acetaminophen, alum & mag hydroxide-simeth, bisacodyl, calcium carbonate, diphenhydrAMINE, guaiFENesin-dextromethorphan, ondansetron (ZOFRAN) IV **OR** ondansetron, polyethylene glycol, prochlorperazine **OR** prochlorperazine **OR** prochlorperazine, senna-docusate, sodium chloride flush, sodium phosphate   Vital Signs    Vitals:   10/07/18 1945 10/07/18 1949 10/07/18 2011 10/08/18 0607  BP: (!) 117/43 (!) 111/49 (!) 94/52 (!) 123/47  Pulse: 83 74  72  Resp: 16   14  Temp: 98.1 F (36.7 C)   99 F (37.2 C)  TempSrc: Oral   Oral  SpO2: 96%   92%  Weight:    74.7 kg  Height:        Intake/Output Summary (Last 24 hours) at 10/08/2018 0949 Last data filed at 10/08/2018 3762 Gross per 24 hour  Intake 755 ml  Output 1700 ml  Net -945 ml   Last 3 Weights 10/08/2018 10/06/2018 10/05/2018  Weight (lbs)  164 lb 10.9 oz 163 lb 9.3 oz 164 lb 0.4 oz  Weight (kg) 74.7 kg 74.2 kg 74.4 kg      Telemetry    n/a - Personally Reviewed  ECG    n/a - Personally Reviewed  Physical Exam  Pale, comfortable GEN: No acute distress.   Neck: Mild JVD Cardiac: RRR, no murmurs, rubs, or gallops.  Respiratory: Clear to auscultation bilaterally. GI: Soft, nontender, non-distended  MS: 1-2+ soft ankle edema edema; No deformity. Neuro:  Nonfocal  Psych: Normal affect   Labs    Chemistry Recent Labs  Lab 10/06/18 0601 10/07/18 0838 10/08/18 0528  NA 139 137 138  K 5.0 4.6 5.7*  CL 98 97* 98  CO2 32 30 25  GLUCOSE 97 106* 112*  BUN 57* 69* 79*  CREATININE 2.42* 2.60* 2.73*  CALCIUM 9.0 8.7* 8.8*  GFRNONAA 18* 16* 15*  GFRAA 21* 19* 18*  ANIONGAP 9 10 15      Hematology Recent Labs  Lab 10/03/18 0335 10/07/18 0838 10/07/18 1039  WBC 5.5 6.0 5.4  RBC 2.45* 2.15* 2.17*  HGB 7.4* 6.5* 6.6*  HCT 24.3* 20.3* 20.9*  MCV 99.2 94.4 96.3  MCH 30.2 30.2 30.4  MCHC 30.5 32.0 31.6  RDW 18.6* 18.0* 18.2*  PLT 161 154 162  Cardiac EnzymesNo results for input(s): TROPONINI in the last 168 hours. No results for input(s): TROPIPOC in the last 168 hours.   BNPNo results for input(s): BNP, PROBNP in the last 168 hours.   DDimer No results for input(s): DDIMER in the last 168 hours.   Radiology    No results found.  Cardiac Studies    2D Echo 09/29/18 IMPRESSIONS  1. The left ventricle has moderate-severely reduced systolic function, with an ejection fraction of 30-35%. The cavity size was mildly dilated. Left ventricular diastolic Doppler parameters are consistent with pseudonormalization. Elevated left atrial  and left ventricular end-diastolic pressures Left ventricular diffuse hypokinesis. 2. The right ventricle has mildly reduced systolic function. The cavity was mildly enlarged. There is no increase in right ventricular wall thickness. Right ventricular systolic pressure is  severely elevated with an estimated pressure of 56.6 mmHg. 3. Left atrial size was mildly dilated. 4. Right atrial size was mildly dilated. 5. Large pleural effusion in the left lateral region. 6. Trivial pericardial effusion is present. 7. Tricuspid valve regurgitation is mild-moderate. 8. The aortic valve is tricuspid. Mild thickening of the aortic valve. Mild calcification of the aortic valve. Aortic valve regurgitation is mild by color flow Doppler. No stenosis of the aortic valve.  Patient Profile     85 y.o. female with a hx of paroxysmal atrial fibrillation, h/osinus bradycardia beta-blocker previously held, CKD stage III, dyslipidemia, hypertension, recent urosepsis and pancreatitis,seen in CIRfor the evaluation of new onset systolic heart failure. Now with slow worsening of renal function, diuretics on hold.  Assessment & Plan    1. Acute combined systolic and diastolic CHF -EF 50% by echo with severe pulmonary hypertension. -diuretics on hold for worsening renal function, will give 500 mL NS slowly today. -Weight down ~30 lbs from peak wt. Now stable last 3-4 days. -Medical therapy includes hydralazine/isordil. Low DBP (could this be contributing to renal insufficiency?) Will reduce the dose of hydralazine.   2. Atrial fibrillation -On amiodarone 100 mg daily -On Eliquis for stroke risk reduction.Dose adjusted for age/renal dysfunction. -rate well controlled by VS checks (no telemetry)  3.Stage 3 CKD, acute injury -SCr continues to rise and now hyperkalemic. Diuretic on hold. NS 500 mL IV over 5 hours. Kayexelate has been ordered. -Would resume lasix at 40 mg daily (down from BID) once SCr back to 1.5.   6.Anemia s/p transfusion. Hgb needs to be rechecked..     For questions or updates, please contact [place] Please consult [email or web address] for contact info under        Signed, Sanda Klein, MD  10/08/2018, 9:49 AM

## 2018-10-08 NOTE — Plan of Care (Signed)
  Problem: RH SKIN INTEGRITY Goal: RH STG MAINTAIN SKIN INTEGRITY WITH ASSISTANCE Description STG Maintain Skin Integrity With Mod.Assistance.  Outcome: Progressing   Problem: RH SAFETY Goal: RH STG ADHERE TO SAFETY PRECAUTIONS W/ASSISTANCE/DEVICE Description STG Adhere to Safety Precautions With Min Assistance/Device.  Outcome: Progressing   Problem: RH PAIN MANAGEMENT Goal: RH STG PAIN MANAGED AT OR BELOW PT'S PAIN GOAL Description Patient will be pain free or pain less than 3 during admission  Outcome: Progressing

## 2018-10-08 NOTE — Progress Notes (Signed)
Speech Language Pathology Daily Session Note  Patient Details  Name: Maureen Stout MRN: [id number] Date of Birth: 05/11/1932  Today's Date: 10/08/2018 SLP Individual Time: 1330-1400 SLP Individual Time Calculation (min): 30 min  Short Term Goals: Week 3: SLP Short Term Goal 1 (Week 3): Patient will demonstrate sustained attention to functional tasks for 30 minutes with Mod A verbal cues for redirection SLP Short Term Goal 2 (Week 3): Patient will demonstrate functional problem solving for basic and familiar tasks with Mod A verbal cues.  SLP Short Term Goal 3 (Week 3): Patient will utilize external aids to recall new, daily information with Mod A multimodal cues.  SLP Short Term Goal 4 (Week 3): Patient will identify 2 physical and 2 cognitive changes with Mod A verbal cues.   Skilled Therapeutic Interventions:  Skilled treatment session focused on cognition goals. SLP recieved pt finishing lunch tray. Pt demonstrated good attention to task and increased consumption over previous sessions wth this Probation officer. Pt also able to recognize this wirter from previous seeing her in the hall earlier in the day. Additionally, with Min A cues, pt able to recall discharge plan and she was able to recall activities from session earlier in day with PTA. Pt continues to demonstrate increased sustained alertness throughout her day.  As a result, she is much better at comprehending information and being a more active participant in her care. Pt able to advocate for toileting and several other needs during session. Pt handed off to nursing. Pt with discharge set for 10/10/18.        Pain    Therapy/Group: Individual Therapy  Maureen Stout 10/08/2018, 4:01 PM

## 2018-10-08 NOTE — Progress Notes (Signed)
Occupational Therapy Session Note  Patient Details  Name: Maureen Stout MRN: [id number] Date of Birth: 30-Mar-1932  Today's Date: 10/08/2018 OT Individual Time: 6394-3200 OT Individual Time Calculation (min): 31 min   Short Term Goals: Week 2:  OT Short Term Goal 1 (Week 2): Pt will complete bathing at sit > stand level with min assist OT Short Term Goal 2 (Week 2): Pt will complete toilet transfers with min assist  OT Short Term Goal 3 (Week 2): Pt will complete LB dressing with min assist    Skilled Therapeutic Interventions/Progress Updates:    Pt greeted in the recliner with no c/o pain or dizziness. Started session with ambulatory toilet transfer using device. She required steady assist and vcs for RW mgt, especially when transferring onto surfaces. Pt able to complete clothing mgt with Min A for balance. She had an incontinent BM in brief, but unable to continue voiding while sitting on toilet. Pt able to complete hygiene thoroughly herself while seated. Mod A for sit<stand from standard toilet with vcs for hand placement. She required assist for donning brief, but able to elevate her pants. Pt then ambulated to EOB where 02 sats were checked. After, she ambulated to sink to complete handwashing with steady assist for standing balance. Pt returned to recliner and was left with all needs within reach and safety belt fastened. Tx focus placed on ADL retraining, standing balance, and functional ambulation.   02 sats monitored during session and remained between 96-98% at rest and during activity (on RA).   Therapy Documentation Precautions:  Precautions Precautions: Fall Restrictions Weight Bearing Restrictions: No Vital Signs: Therapy Vitals Pulse Rate: 75 BP: (!) 130/59 Patient Position (if appropriate): Sitting ADL:       Therapy/Group: Individual Therapy  Zaynab Chipman A Brently Voorhis 10/08/2018, 4:20 PM

## 2018-10-08 NOTE — Progress Notes (Signed)
Physical Therapy Session Note  Patient Details  Name: Maureen Stout MRN: [id number] Date of Birth: May 06, 1932  Today's Date: 10/08/2018 PT Individual Time: 6283-1517 PT Individual Time Calculation (min): 71 min   Short Term Goals: Week 3:  PT Short Term Goal 1 (Week 3): = LTGs at min assist level (downgraded due to fluctuating levels)     Therapy Documentation Precautions:  Precautions Precautions: Fall Restrictions Weight Bearing Restrictions: No  Treatment: Pt awake in bed on arrival and agreeable to PT today. Requesting to use bathroom. Denies any pain.  Bed mobility: supervision for supine to sit at edge of bed with HOB 35 degrees and rail used. Pt then able to use both arms to scoot to edge of bed with directional cues.   There-Activity: Pt able to stand with RW support/min guard assist to self perform peri care. Pt able to sit edge of bed with supervision to perform the following: donning shirt with min assist, donning pants with pt holding legs up for PTA to place pants on, once they were pulled up to knees the pt was able to assist with pulling them fully up. Seated in wheelchair at sink pt able to use comb to brush hair without cues/assitance needed.   Transfers: stand pivot transfer with RW bed<>bsc with min assist for balance with pulling down depends, min guard assist with actual transfer. Sit<>stand transfer performed several times during session with min guard assist, cues each time for hand placement for safety. Needs RW for stability upon standing and with sitting down.    Gait: 10 feet with RW with 180 degree turns to sit in chair x 5 reps, min assist with cues on posture, walker use/positioning and to increase step length with bil LE's.  Exercises: un-supported sitting at edge of mat: heel/toe raies, long arc quads x 10 reps. Sit<>stands x 10 reps with UE support/assist- cues for full upright standing, slow controlled descent with sitting back down. With RW support/min  guard to min assist for balance- fwd step ups with 4 inch box x 10 reps each leg, alternating marching and mini squats for 2 sets of 10 reps each leg. Supported sitting- hamstring curls with level 1 band for 2 sets of 10 reps bil LE's.    Clinical impression: Pt left in recliner with alarm belt on and all needs in reach. More alert and oriented today, conversational and following all commands without delay. Does continue to fatigue with activity, needing frequent short rest breaks. Accurate recall of discharge plan, personal history and when unsure would state "my memory is bad on that one".   Therapy/Group: Individual Therapy  Lindon Romp, PTA, CLT 10/08/18, 12:27 PM

## 2018-10-09 ENCOUNTER — Inpatient Hospital Stay (HOSPITAL_COMMUNITY): Payer: Medicare PPO | Admitting: Occupational Therapy

## 2018-10-09 ENCOUNTER — Inpatient Hospital Stay (HOSPITAL_COMMUNITY): Payer: Medicare PPO | Admitting: Physical Therapy

## 2018-10-09 LAB — CBC
HCT: 22.3 % — ABNORMAL LOW (ref 36.0–46.0)
Hemoglobin: 7.2 g/dL — ABNORMAL LOW (ref 12.0–15.0)
MCH: 31 pg (ref 26.0–34.0)
MCHC: 32.3 g/dL (ref 30.0–36.0)
MCV: 96.1 fL (ref 80.0–100.0)
Platelets: 166 10*3/uL (ref 150–400)
RBC: 2.32 MIL/uL — ABNORMAL LOW (ref 3.87–5.11)
RDW: 17.9 % — ABNORMAL HIGH (ref 11.5–15.5)
WBC: 8.3 10*3/uL (ref 4.0–10.5)
nRBC: 0 % (ref 0.0–0.2)

## 2018-10-09 LAB — BASIC METABOLIC PANEL
Anion gap: 11 (ref 5–15)
BUN: 70 mg/dL — ABNORMAL HIGH (ref 8–23)
CO2: 28 mmol/L (ref 22–32)
Calcium: 8.7 mg/dL — ABNORMAL LOW (ref 8.9–10.3)
Chloride: 101 mmol/L (ref 98–111)
Creatinine, Ser: 2.35 mg/dL — ABNORMAL HIGH (ref 0.44–1.00)
GFR calc Af Amer: 21 mL/min — ABNORMAL LOW (ref >60)
GFR calc non Af Amer: 18 mL/min — ABNORMAL LOW (ref >60)
Glucose, Bld: 109 mg/dL — ABNORMAL HIGH (ref 70–99)
Potassium: 4.3 mmol/L (ref 3.5–5.1)
Sodium: 140 mmol/L (ref 135–145)

## 2018-10-09 NOTE — Progress Notes (Signed)
Feeling well. No dyspnea. Normal BP/HR. Creatinine improving. Would keep off diuretics for another 24 hours. Sanda Klein, MD, [facility] [phone] office [phone] pager

## 2018-10-09 NOTE — Discharge Summary (Signed)
Occupational Therapy Discharge Summary  Patient Details  Name: Maureen Stout MRN: [id number] Date of Birth: 1932/05/06  Today's Date: 10/09/2018 OT Individual Time: 1050-1201 OT Individual Time Calculation (min): 71 min   Patient has met 7 of 10 long term goals due to improved activity tolerance, improved balance, postural control, ability to compensate for deficits, improved attention, improved awareness and improved coordination.  Patient to discharge at overall Mod Assist level.  Patient's son Vicente Serene was called to discuss d/c plans for ADL completion at home. He reported reviewing shower transfers during PT family education session and that he already had the needed bathroom equipment. Discussed safety recommendations for self care post d/c and he verbalized understanding. Vicente Serene reported having no further questions from OT in regards to d/c.    Reasons goals not met: Pt still requires Max A for LB dressing due to limited LE mobility. Pt also still requires Mod A for toilet transfers and Min A for UB dressing. Therefore stated goals were unable to be met. Family has verbalized that they can provide this needed ADL assist at d/c    Recommendation:  Patient will benefit from ongoing skilled OT services in home health setting to continue to advance functional skills in the area of BADL.  Equipment: No equipment provided. Son has confirmed that he has a BSC and shower chair  Reasons for discharge: treatment goals met and discharge from hospital  Patient/family agrees with progress made and goals achieved: Yes   Skilled Therapeutic Intervention:  Pt greeted in recliner with no c/o pain. Agreeable to complete bathing/dressing sit<stand from recliner using RW. UB self care completed with Min A for bra. Sit<stand completed with steady assist, and steady assist for balance during perihygiene completion and elevating pants over hips. She needed Max A for LB dressing overall due to decreased functional  LE mobility. Pt ambulated short distance to w/c placed at sink using RW with steady assist. While seated she washed her dentures with setup and increased time. Pt then ambulated back to recliner. We called her son, Vicente Serene, to discuss DME needs for home and solidify d/c plans. At end of call, Vicente Serene reported he had no further questions from OT. Pt was left in recliner with LEs elevated, safety belt fastened, and all needs within reach.   02 sats on RA remained between 96-100% during tx   OT Discharge Precautions/Restrictions  Precautions Precautions: Fall Vital Signs Therapy Vitals BP: 132/63 Pain Pain Assessment Pain Scale: 0-10 ADL ADL Grooming: Setup Where Assessed-Grooming: Sitting at sink, Wheelchair Upper Body Bathing: Supervision/safety Where Assessed-Upper Body Bathing: Other (Comment)(recliner) Lower Body Bathing: Minimal assistance Where Assessed-Lower Body Bathing: Other (Comment)(sit<stand from recliner) Upper Body Dressing: Minimal assistance Where Assessed-Upper Body Dressing: Other (Comment)(sitting in recliner) Lower Body Dressing: Maximal assistance Where Assessed-Lower Body Dressing: Other (Comment)(sit<stand from recliner) Toileting: Moderate assistance Where Assessed-Toileting: Glass blower/designer: Moderate assistance Toilet Transfer Method: Counselling psychologist: Energy manager: Not assessed ADL Comments: All sit<stands and functional transfers completed using RW Vision Baseline Vision/History: Wears glasses Wears Glasses: Reading only Patient Visual Report: No change from baseline Vision Assessment?: No apparent visual deficits Perception  Perception: Within Functional Limits Praxis Praxis: Intact Cognition Overall Cognitive Status: Within Functional Limits for tasks assessed Arousal/Alertness: Awake/alert Orientation Level: Oriented to person;Oriented to place;Oriented to time;Disoriented to situation Attention:  Sustained Sustained Attention: Appears intact Memory: Impaired Awareness: Impaired Awareness Impairment: Intellectual impairment Problem Solving: Impaired Safety/Judgment: Impaired Sensation Coordination Gross Motor Movements are Fluid and  Coordinated: No Fine Motor Movements are Fluid and Coordinated: No Motor  Motor Motor: Abnormal postural alignment and control Motor - Discharge Observations: generalized debility and weakness Mobility    Mod A for toilet transfers at ambulatory level using device Trunk/Postural Assessment  Cervical Assessment Cervical Assessment: Exceptions to WFL(forward head) Thoracic Assessment Thoracic Assessment: Exceptions to WFL(kyphotic) Lumbar Assessment Lumbar Assessment: Exceptions to WFL(posterior pelvic tilt) Postural Control Postural Control: Deficits on evaluation  Balance Dynamic Sitting Balance Dynamic Sitting - Level of Assistance: 5: Stand by assistance(washing LEs when sitting at edge of recliner) Dynamic Standing Balance Dynamic Standing - Level of Assistance: 4: Min assist(Perihygiene completion using RW) Extremity/Trunk Assessment RUE Assessment RUE Assessment: Within Functional Limits Active Range of Motion (AROM) Comments: WFL General Strength Comments: overall deconditioned 3+ to 4/5 LUE Assessment LUE Assessment: Within Functional Limits Active Range of Motion (AROM) Comments: WFL General Strength Comments: overall deconditioned 3+ to 4/5   Breann Losano A Krystine Pabst 10/09/2018, 12:42 PM

## 2018-10-09 NOTE — Progress Notes (Signed)
Physical Therapy Session Note  Patient Details  Name: Maureen Stout MRN: [id number] Date of Birth: [birth year]  Today's Date: 10/09/2018 PT Individual Time: 0900-1008 PT Individual Time Calculation (min): 68 min   Short Term Goals: Week 3:  PT Short Term Goal 1 (Week 3): = LTGs at min assist level (downgraded due to fluctuating levels)  Skilled Therapeutic Interventions/Progress Updates: Pt presented in bed agreeable to therapy. Session focused on functional activities in preparation for d/c. Performed supine to sit CGA with increased time and use of bed features. Pt noted to not have pants on, PTA threaded pants total A for time management and pt performed STS from EOB with CGA and increased time. Pt was able to assist pulling up pants to complete clothing management. Pt then transported to rehab gym and performed car transfer with CGA to enter car but required minA to perform stand to exit vehicle. Pt then transported to rehab gym and transferred to mat CGA. Pt participated in seated and standing therex including seated, LAQ, seated marches, and hamstring pulls. Pt performed standing hip abd/add, marches, and SLR with RW. All performed 10-15 reps with frequent rests. Pt also participated in ball toss in seated for endurance and sitting balance as well as 2 bouts of horseshoes in standing for static balance. Pt with no occurances of LOB with all activities. Pt ambulated approx 85f to w/c from mat and returned to room. Once in room pt indicated need for bathroom, ambulation additional 136fto toilet and performed toilet transfer with CGA but required minA for clothing management (+void). Pt ambulated from toilet to recliner in same manner and pt left in recliner at end of session with belt alarm on, call bell within reach and needs met.      Therapy Documentation Precautions:  Precautions Precautions: Fall Restrictions Weight Bearing Restrictions: No General:   Vital Signs: Therapy Vitals BP:  132/63 Pain: Pain Assessment Pain Scale: 0-10 Mobility:   Locomotion :    Trunk/Postural Assessment : Cervical Assessment Cervical Assessment: Exceptions to WFL(forward head) Thoracic Assessment Thoracic Assessment: Exceptions to WFL(kyphotic) Lumbar Assessment Lumbar Assessment: Exceptions to WFL(posterior pelvic tilt) Postural Control Postural Control: Deficits on evaluation  Balance: Dynamic Sitting Balance Dynamic Sitting - Level of Assistance: 5: Stand by assistance(washing LEs when sitting at edge of recliner) Dynamic Standing Balance Dynamic Standing - Level of Assistance: 4: Min assist(Perihygiene completion using RW) Exercises:   Other Treatments:      Therapy/Group: Individual Therapy  Ryan Ogborn 10/09/2018, 12:24 PM

## 2018-10-09 NOTE — Progress Notes (Signed)
[facility] PHYSICAL MEDICINE & REHABILITATION PROGRESS NOTE  Subjective/Complaints: Patient feels okay today she is breathing well.  We discussed her blood work from this morning. Reviewed cardiology note, recommended fluid bolus of 500 mL  ROS: Denies CP, shortness of breath, nausea, vomiting, diarrhea.   Objective: Vital Signs: Blood pressure (!) 119/53, pulse 73, temperature 99.4 F (37.4 C), temperature source Oral, resp. rate 18, height 5\' 6"  (1.676 m), weight 75.7 kg, SpO2 94 %. No results found. Recent Labs    10/07/18 1039 10/09/18 0624  WBC 5.4 8.3  HGB 6.6* 7.2*  HCT 20.9* 22.3*  PLT 162 166   Recent Labs    10/08/18 0528 10/09/18 0624  NA 138 140  K 5.7* 4.3  CL 98 101  CO2 25 28  GLUCOSE 112* 109*  BUN 79* 70*  CREATININE 2.73* 2.35*  CALCIUM 8.8* 8.7*    Physical Exam: BP (!) 119/53 (BP Location: Left Arm)   Pulse 73   Temp 99.4 F (37.4 C) (Oral)   Resp 18   Ht 5\' 6"  (1.676 m)   Wt 75.7 kg   SpO2 94%   BMI 26.94 kg/m  Constitutional: No distress . Vital signs reviewed. HENT: Normocephalic.  Atraumatic. Eyes: EOMI.  No discharge. Cardiovascular: No JVD. Respiratory: Normal effort.  + [place]. GI: Non-distended. Musc: Generalized edema, stable Neurological: Alert and oriented x2. Motor: Grossly 4-/5 throughout, stable Skin: Frail skin. Psychiatric: Flat  Assessment/Plan: 1. Functional deficits secondary to debility and encephalopathy which require 3+ hours per day of interdisciplinary therapy in a comprehensive inpatient rehab setting.  Physiatrist is providing close team supervision and 24 hour management of active medical problems listed below.  Physiatrist and rehab team continue to assess barriers to discharge/monitor patient progress toward functional and medical goals  Care Tool:  Bathing  Bathing activity did not occur: Refused Body parts bathed by patient: Right arm, Left arm, Chest, Abdomen, Face   Body parts bathed by helper:  Front perineal area, Buttocks, Right upper leg, Left upper leg, Right lower leg, Left lower leg     Bathing assist Assist Level: Maximal Assistance - Patient 24 - 49%     Upper Body Dressing/Undressing Upper body dressing Upper body dressing/undressing activity did not occur (including orthotics): Refused What is the patient wearing?: Pull over shirt    Upper body assist Assist Level: Moderate Assistance - Patient 50 - 74%    Lower Body Dressing/Undressing Lower body dressing    Lower body dressing activity did not occur: Refused What is the patient wearing?: Pants, Incontinence brief     Lower body assist Assist for lower body dressing: Total Assistance - Patient < 25%     Toileting Toileting Toileting Activity did not occur Landscape architect and hygiene only): Safety/medical concerns  Toileting assist Assist for toileting: Moderate Assistance - Patient 50 - 74%     Transfers Chair/bed transfer  Transfers assist     Chair/bed transfer assist level: Contact Guard/Touching assist     Locomotion Ambulation   Ambulation assist      Assist level: Minimal Assistance - Patient > 75% Assistive device: Walker-rolling Max distance: 20'   Walk 10 feet activity   Assist  Walk 10 feet activity did not occur: Safety/medical concerns  Assist level: Minimal Assistance - Patient > 75% Assistive device: Walker-rolling   Walk 50 feet activity   Assist Walk 50 feet with 2 turns activity did not occur: Safety/medical concerns         Walk  150 feet activity   Assist Walk 150 feet activity did not occur: Safety/medical concerns         Walk 10 feet on uneven surface  activity   Assist Walk 10 feet on uneven surfaces activity did not occur: Safety/medical concerns         Wheelchair     Assist Will patient use wheelchair at discharge?: No Type of Wheelchair: Manual    Wheelchair assist level: Maximal Assistance - Patient 25 - 49% Max  wheelchair distance: 10    Wheelchair 50 feet with 2 turns activity    Assist            Wheelchair 150 feet activity     Assist            Medical Problem List and Plan: 1.Functional deficits and weaknesssecondary to debility and encephalopathy after urosepsis/pancreatitis and multiple medical issues  Cont CIR, 15/7  Thyroid function within normal limits 2. Antithrombotics: -PAF/DVT/anticoagulation:Pharmaceutical:Other (comment)- Eliquis 3. Pain Management:Tylenol prn. 4. Mood:LCSW to follow for evaluation and support. -antipsychotic agents: N/A 5. Neuropsych: This patientisnot fully capable of making decisions on herown behalf. 6. Skin/Wound Care:Routine pressure relief measures. 7. Fluids/Electrolytes/Nutrition:Strict I/O.   PO intake gradually improving  Megace initiated  IVF d/ced  Barium swallow showing mild esophageal dysmotility 8. Enterococcus bacteremia/UTI: Completed 14-day course of Maxipime   Amoxicillin 500 mg nightly started on 5/7 9. Anasarca/FLuid overload: Added heart healthy restrictions. Weight daily. Lasix daily prn depending stability of weights/symptoms.  Filed Weights   10/06/18 0326 10/08/18 0607 10/09/18 0444  Weight: 74.2 kg 74.7 kg 75.7 kg   Stable on 5/23 10. PAF: Monitor HR bid--back on amiodarone and Eliquis. Off metoprolol due to bradycardia. 11. Urinary retention with recurrent UTIs:   Bladder prolapse  Amoxicillin 500 mg nightly started on 5/7 per IM  Repeat urine culture with yeast-Diflucan x1 completed  Cath for volumes > 350 cc.  Repeat UA/urine culture ordered  Urecholine started 12. Acute on Chronic renal failure: SCr- 1.32 on 04/2018. Continue to monitor with routine checks.   Creatinine at 2.42 on 5/21, labs pending  Lasix decreased on 5/21, on hold per cards  Received IV fluids mL per hour for the last 16 hours, will discontinue 13. Delirium: Resolving.  14. Anemia of chronic  disease: Baseline Hg around 9.0? Likely dropafterhydration.   Monitor for signs of bleeding.   Hemoglobin 6.6 s/p Transfusion 5/22  Continue to monitor 15. Acute pancreatitis: Offer supplements with meals.   LFTs elevated, but improving on 5/15  Amylase/lipase within normal limits  Ammonia WNL  Continue to monitor 16. Hypothyroidism: Now synthroid 75 mcg/day. 33.  Hypertension  Chest x-ray reviewed, showing cardiomegaly.  BNP elevated.  Hydralazine 25 mg 3 times daily ordered 5/23, Vitals:   10/08/18 1938 10/09/18 0432  BP: (!) 125/51 (!) 119/53  Pulse: 71 73  Resp: 16 18  Temp: 98 F (36.7 C) 99.4 F (37.4 C)  SpO2: 96% 94%  Looks well controlled today, cardiology is adjusting medications 18.  Hypokalemia, now hyperkalemic  Potassium 5.0 on 5/21, up to 5.7 on 5/23  Supplement increased on 5/7, decreased on 5/12, decreased again on 5/13, DC'd on 5/21  If additional IV lasix is planned will not use kayexalate, will discuss with Cards 19. Hypotheramia: Resolved  Did not require bear hugger last night, cont to monitor 20.  Acute combined CHF  CXR showing cardiomegaly  BNP elevated  Echo 2018 reviewed, showing EF 60-65%, repeat Echo showing significant decrease  in ejection fraction of 22-02% with diastolic heart failure as well.   Not a candidate for additional procedures at this time, medications being adjusted by cards-appreciate recs  ECG reviewed, stable 21. Esophageal Dysmotility  Per barium swallow  Per GI monitor now that NG tube has been removed. 22. Prediabtes  Labile blood glucose on 5/19  LOS: 20 days A FACE TO Adona E Loni Abdon 10/09/2018, 8:43 AM

## 2018-10-10 LAB — BASIC METABOLIC PANEL
Anion gap: 10 (ref 5–15)
BUN: 69 mg/dL — ABNORMAL HIGH (ref 8–23)
CO2: 28 mmol/L (ref 22–32)
Calcium: 9 mg/dL (ref 8.9–10.3)
Chloride: 102 mmol/L (ref 98–111)
Creatinine, Ser: 2.05 mg/dL — ABNORMAL HIGH (ref 0.44–1.00)
GFR calc Af Amer: 25 mL/min — ABNORMAL LOW (ref >60)
GFR calc non Af Amer: 22 mL/min — ABNORMAL LOW (ref >60)
Glucose, Bld: 118 mg/dL — ABNORMAL HIGH (ref 70–99)
Potassium: 4.5 mmol/L (ref 3.5–5.1)
Sodium: 140 mmol/L (ref 135–145)

## 2018-10-10 LAB — CBC
HCT: 22.9 % — ABNORMAL LOW (ref 36.0–46.0)
Hemoglobin: 7.3 g/dL — ABNORMAL LOW (ref 12.0–15.0)
MCH: 30.7 pg (ref 26.0–34.0)
MCHC: 31.9 g/dL (ref 30.0–36.0)
MCV: 96.2 fL (ref 80.0–100.0)
Platelets: 175 10*3/uL (ref 150–400)
RBC: 2.38 MIL/uL — ABNORMAL LOW (ref 3.87–5.11)
RDW: 17.4 % — ABNORMAL HIGH (ref 11.5–15.5)
WBC: 7.8 10*3/uL (ref 4.0–10.5)
nRBC: 0 % (ref 0.0–0.2)

## 2018-10-10 MED ORDER — PRO-STAT SUGAR FREE PO LIQD: 30.0000 mL | Freq: Two times a day (BID) | ORAL | 0 refills | Status: DC

## 2018-10-10 MED ORDER — CALCIUM-D 600-400 MG-UNIT PO TABS: 1.0000 | ORAL_TABLET | Freq: Two times a day (BID) | ORAL | 1 refills | Status: AC

## 2018-10-10 MED ORDER — POLYETHYLENE GLYCOL 3350 17 G PO PACK: 17.0000 g | PACK | Freq: Every day | ORAL | 0 refills | Status: AC

## 2018-10-10 MED ORDER — TAMSULOSIN HCL 0.4 MG PO CAPS: 0.4000 mg | ORAL_CAPSULE | Freq: Every day | ORAL | 1 refills | Status: DC

## 2018-10-10 MED ORDER — MEGESTROL ACETATE 400 MG/10ML PO SUSP: 400.0000 mg | Freq: Every day | ORAL | 1 refills | Status: DC

## 2018-10-10 MED ORDER — AMOXICILLIN 500 MG PO CAPS: 500.0000 mg | ORAL_CAPSULE | Freq: Every day | ORAL | 1 refills | Status: DC

## 2018-10-10 MED ORDER — HYDRALAZINE HCL 25 MG PO TABS: 25.0000 mg | ORAL_TABLET | Freq: Three times a day (TID) | ORAL | 1 refills | Status: DC

## 2018-10-10 MED ORDER — ISOSORBIDE DINITRATE 10 MG PO TABS: 10.0000 mg | ORAL_TABLET | Freq: Three times a day (TID) | ORAL | 1 refills | Status: DC

## 2018-10-10 MED ORDER — APIXABAN 2.5 MG PO TABS: 2.5000 mg | ORAL_TABLET | Freq: Two times a day (BID) | ORAL | 1 refills | Status: AC

## 2018-10-10 MED ORDER — LEVOTHYROXINE SODIUM 75 MCG PO TABS: 75.0000 ug | ORAL_TABLET | Freq: Every day | ORAL | 1 refills | Status: DC

## 2018-10-10 MED ORDER — AMIODARONE HCL 100 MG PO TABS: 100.0000 mg | ORAL_TABLET | Freq: Every day | ORAL | 1 refills | Status: DC

## 2018-10-10 MED ORDER — ATORVASTATIN CALCIUM 10 MG PO TABS: 10.0000 mg | ORAL_TABLET | Freq: Every day | ORAL | 1 refills | Status: AC

## 2018-10-10 NOTE — Discharge Instructions (Signed)
Inpatient Rehab Discharge Instructions  Maureen Stout Discharge date and time:  10/10/18  Activities/Precautions/ Functional Status: Activity: no lifting, driving, or strenuous exercise till cleared by MD.  Diet: cardiac diet Low salt.   Wound Care: none needed    Functional status:  ___ No restrictions     ___ Walk up steps independently _X__ 24/7 supervision/assistance   ___ Walk up steps with assistance ___ Intermittent supervision/assistance  ___ Bathe/dress independently ___ Walk with walker     _X__ Bathe/dress with assistance ___ Walk Independently    ___ Shower independently ___ Walk with assistance    ___ Shower with assistance _X__ No alcohol     ___ Return to work/school ________   Special Instructions: 1. Assist with medications. 2. Perform foley care twice a day. Can put foley in pillow case or a clean bag to cover it.  3.  Offer supplements during the day to help improve intake.     COMMUNITY REFERRALS UPON DISCHARGE:    Home Health:   PT, OT, RN   Agency:[facility]   Phone:[phone]   Date of last service:10/10/2018  Medical Equipment/Items Ordered: NO NEEDS SON TO GET EQUIPMENT SHE WILL NEED AT HOME. HAS THE LIST    My questions have been answered and I understand these instructions. I will adhere to these goals and the provided educational materials after my discharge from the hospital.  Patient/Caregiver Signature _______________________________ Date __________  Clinician Signature _______________________________________ Date __________  Please bring this form and your medication list with you to all your follow-up doctor's appointments.

## 2018-10-10 NOTE — Progress Notes (Signed)
Speech Language Pathology Discharge Summary  Patient Details  Name: Maureen Stout MRN: [id number] Date of Birth: Feb 10, 1932  Today's Date: 10/10/2018     Patient has met 6 of 6 long term goals. Patient to discharge at overall Min;Mod level.    Clinical Impression/Discharge Summary:   Pt has made progress in skilled ST sessions and as a result she has met all of her STGs. She is currently at [place] A to Mod A for all cognitive tasks including memory, problem solving, task flexibility, insight and attention. Skilled St recommends continued therapy thru [place] to target safety within home environment.   Care Partner:  Caregiver Able to Provide Assistance: Yes  Type of Caregiver Assistance: Physical;Cognitive  Recommendation:  24 hour supervision/assistance;Home Health SLP  Rationale for SLP Follow Up: Reduce caregiver burden;Maximize functional communication;Maximize cognitive function and independence;Maximize swallowing safety   Equipment:   N/A  Reasons for discharge: Discharged from hospital   Patient/Family Agrees with Progress Made and Goals Achieved: Yes    Achilles Neville 10/10/2018, 9:00 AM

## 2018-10-10 NOTE — Progress Notes (Signed)
Patient slept for 8 hours this shift. No complaints verbalized. In and out cath performed x2 per prn orders. No prn medications.

## 2018-10-10 NOTE — Progress Notes (Signed)
Social Work  Discharge Note  The overall goal for the admission was met for:   Discharge location: [place] [facility]  Length of Stay: Yes-21 DAYS  Discharge activity level: Yes-MIN ASSIST LEVEL  Home/community participation: Yes  Services provided included: MD, RD, PT, OT, SLP, RN, CM, Pharmacy and SW  Financial Services: Private Insurance: [place]  Follow-up services arranged: Home Health: [facility] and Patient/Family has no preference for HH/DME agencies  Comments (or additional information):SON AND DAUGHTER [place] WERE HERE FOR TRAINING AND FEEL THEY CAN PROVIDE THE MIN ASSIST LEVEL OF CARE AND 24 HR. FAMILY AND PT DECIDED UPON [place] INSTEAD OF I & O CATH.  Patient/Family verbalized understanding of follow-up arrangements: Yes  Individual responsible for coordination of the follow-up plan: [name]-SON  Confirmed correct DME delivered: [name] 10/10/2018    [name]

## 2018-10-10 NOTE — Discharge Summary (Addendum)
Physician Discharge Summary  Patient ID: Maureen Stout MRN: [id number] DOB/AGE: Oct 09, 1931 85 y.o.  Admit date: 09/19/2018 Discharge date: 10/10/2018  Discharge Diagnoses:  Principal Problem:   Debility Active Problems:   Acute on chronic anemia   Acute on chronic kidney failure (HCC)   Urinary retention   Failure to thrive (child)   Recurrent UTI   CKD (chronic kidney disease), stage III (HCC)   Edema   Esophageal dysmotility   Cardiomegaly   Acute combined systolic and diastolic congestive heart failure (HCC)   PAH (pulmonary artery hypertension) ([place])   Discharged Condition: stable   Significant Diagnostic Studies: Dg Chest 2 View  Result Date: 09/28/2018 CLINICAL DATA:  Edema. EXAM: CHEST - 2 VIEW COMPARISON:  09/06/2018 FINDINGS: The cardiac silhouette is enlarged. There are small moderate bilateral pleural effusions. There is generalized volume overload with developing pulmonary edema. There is no pneumothorax. Enteric tube is noted, the tip of which terminates below the left hemidiaphragm. The patient's previously ingested 13 mm barium tablet again projects near the GE junction. Aortic calcium is noted. Bibasilar atelectasis is seen. IMPRESSION: 1. Cardiomegaly with small to moderate-sized bilateral pleural effusions and generalized volume overload/pulmonary edema. 2. Enteric tube terminates below the left hemidiaphragm. 3. The previously ingested 13 mm barium tablet remains at the GE junction. Electronically Signed   By: Constance Holster M.D.   On: 09/28/2018 15:18   Dg Abd 1 View  Result Date: 09/24/2018 CLINICAL DATA:  85 year old female with gastric tube placement EXAM: ABDOMEN - 1 VIEW COMPARISON:  09/22/2018 FINDINGS: Limited plain film abdomen demonstrates gastric tube terminating within the stomach in left upper quadrant. No distended stomach small bowel or colon. Aortic calcifications. Enteric tube has been removed. IMPRESSION: Gastric tube terminates within the stomach.  Electronically Signed   By: Corrie Mckusick D.O.   On: 09/24/2018 13:12   Dg Abd 1 View  Result Date: 09/22/2018 CLINICAL DATA:  Feeding tube placement. EXAM: ABDOMEN - 1 VIEW FLUOROSCOPY TIME:  Fluoroscopy Time: 1 minutes 24 seconds Radiation Exposure Index: 782.39 microGray*m^2 COMPARISON:  CT abdomen and pelvis 09/12/2018 FINDINGS: A single fluoroscopic image is provided. A feeding tube terminates in the central abdomen just left of midline near the ligament of Treitz, and injected contrast opacifies small bowel. IMPRESSION: Feeding tube placement as above. Electronically Signed   By: Logan Bores M.D.   On: 09/22/2018 15:09   Ct Head Wo Contrast  Result Date: 09/21/2018 CLINICAL DATA:  Altered level of consciousness. EXAM: CT HEAD WITHOUT CONTRAST TECHNIQUE: Contiguous axial images were obtained from the base of the skull through the vertex without intravenous contrast. COMPARISON:  None. FINDINGS: Brain: No evidence of acute infarction, hemorrhage, hydrocephalus, extra-axial collection or mass lesion/mass effect. Vascular: No hyperdense vessel or unexpected calcification. Skull: Intact.  No focal lesion. Sinuses/Orbits: Status post cataract surgery.  Otherwise negative. Other: None. IMPRESSION: Negative head CT. Electronically Signed   By: Inge Rise M.D.   On: 09/21/2018 13:15   Dg Esophagus W Single Cm (sol Or Thin Ba)  Result Date: 09/28/2018 CLINICAL DATA:  Dysphagia. Poor p.o. intake and regurgitation during meals. EXAM: ESOPHOGRAM/BARIUM SWALLOW TECHNIQUE: Single contrast examination was performed using  thin barium. FLUOROSCOPY TIME:  Fluoroscopy Time:  3 minutes, 18 seconds. Radiation Exposure Index (if provided by the fluoroscopic device): 12.8 mGy Number of Acquired Spot Images: 0 COMPARISON:  None. FINDINGS: Nasogastric tube in place. Reduced peristalsis with mild tertiary contractions occurred during four of the patient's nine swallows. No obstruction to  the forward flow of contrast  throughout the esophagus and into the stomach. Normal esophageal course and contour. Normal esophageal mucosal pattern. No esophageal stricture, ulceration, or other significant abnormality. No hiatal hernia. No gastroesophageal reflux occurred spontaneously. A 13 mm barium tablet reached the distal esophagus but did not pass into the stomach. IMPRESSION: 1. Findings consistent with mild esophageal dysmotility. 2. Barium tablet reached the distal esophagus but did not pass into the stomach. No discrete stricture identified. The passage of the tablet may have been inhibited by the indwelling nasogastric tube. Electronically Signed   By: Titus Dubin M.D.   On: 09/28/2018 14:19    Labs:  Basic Metabolic Panel: Recent Labs  Lab 10/04/18 0408 10/06/18 0601 10/07/18 2876 10/08/18 0528 10/09/18 0624 10/10/18 0640  NA 140 139 137 138 140 140  K 4.7 5.0 4.6 5.7* 4.3 4.5  CL 96* 98 97* 98 101 102  CO2 33* 32 30 25 28 28   GLUCOSE 105* 97 106* 112* 109* 118*  BUN 39* 57* 69* 79* 70* 69*  CREATININE 1.81* 2.42* 2.60* 2.73* 2.35* 2.05*  CALCIUM 9.3 9.0 8.7* 8.8* 8.7* 9.0    CBC: Recent Labs  Lab 10/07/18 1039 10/09/18 0624 10/10/18 0640  WBC 5.4 8.3 7.8  HGB 6.6* 7.2* 7.3*  HCT 20.9* 22.3* 22.9*  MCV 96.3 96.1 96.2  PLT 162 166 175    CBG: No results for input(s): GLUCAP in the last 168 hours.  Brief HPI:    Maureen Stout is an 85 year old female with history of T2DM, PAF, CKD, enterovesicular fistula, sinus bradycardia who was admitted on 09/06/18 with marked bradycardia and acute on chronic renal failure. She was treated with IVF and amiodarone discontinued.  Hospital course complicated by urosepsis with recurrent bouts of hypothermia, acute pancreatitis, acute on chronic anemia requiring 2 units of PRBC as well as development of anasarca with fluid overload treated with IV diuresis.  She also had issues with urinary retention, minimal po intake and significant debility. CIR was  recommended due to functional decline.    Hospital Course: Maureen Stout was admitted to rehab 09/19/2018 for inpatient therapies to consist of PT, ST and OT at least three hours five days a week. Past admission physiatrist, therapy team and rehab RN have worked together to provide customized collaborative inpatient rehab.  She continues to have bouts of lethargy with delirium and poor p.o. intake.  She had poor energy levels with orthostatic symptoms and to feeds were attempted however is better she continued to pull tube out.  She also had issues with recurrent hypothermia with concerns of sepsis requiring Bair hugger for a couple of days.  She completed 14 out of 14 days of Maxipime.  She was maintained on amiodarone for rate control.  Bladder function was monitored with PVR checks and showed ongoing retention.  Teaching service was consulted for input due to ongoing issues with delirium, poor p.o. intake and concerns of recurrent sepsis..  They recommended supportive care as well as low-dose amoxicillin at nights for UTI prophylaxis as well as consulting palliative care to help with goals of care. She required bear hugger for a couple of days.    Nutritional supplements were offered multiple times throughout the day to help with intake.  Barium swallow was done revealing esophageal dysmotility.  Family does report issues with poor p.o. intake in the past and was educated on providing multiple meals throughout the day.  She dislikes Ensure supplements and she was  started on IV fluids for gentle hydration due to acute on chronic renal failure and Megace was also added to help with appetite.  Chest x-ray on 5/13 showed generalized volume overload and cardiology was consulted for input.  2D echo was done showing reduction in EF to 30 to 35% with diffuse hypokinesis and severe pulmonary hypertension as well as large left pleural effusion. She was treated with IV  lasix and isordil and hydralazine were added with  resolution of acute on chronic renal failure. Diuretics were adjusted as po route due to acute on chronic renal failure with rise in SCr to 2.42. Lasix was decreased to once a day and she was treated with 500 cc fluid for hydration with SCr trending down to 2.05. Lasix was discontinued on 5/25 due to acute on chronic renal failure and weight is stable around 170 lbs.    Chronic wound on right arm which is treated with Aquacel silver and foam dressing.  This had dried out and scabbed over by discharge. Repeat urine culture of 5/10 showed 40,000 yeast and she ws treated with a dose of diflucan. She did have recurrent drop in H/H to 6.6/820.9 and was transfused with one additional unit of PRBC on 5/22 with improvement in H/H to 7.3/22.9.  Family was given option of in and out caths versus Foley placement due to ongoing issues with urinary retention.  They elected on placement of Foley at discharge and she has been set to follow-up with urology on Friday.  Her energy levels have greatly improved and lethargy has resolved.  Respiratory status is stable.  Family education was completed with son and daughter-in-law and they feel that they can provide amount of care needed past discharge.  She has progressed to min assist level and will continue to receive further follow-up home health PT, OT, RN and an aide by [facility] after discharge.   Rehab course: During patient's stay in rehab weekly team conferences were held to monitor patient's progress, set goals and discuss barriers to discharge. At admission, patient required mod assist for mobility and max assist with basic ADLs. She exhibited severe cognitive linguistic deficits with generalized confusion and word finding deficits affecting expression of wants and needs. She  has had improvement in activity tolerance, balance, postural control as well as ability to compensate for deficits. She requires min assist with upper body dressing and max assist for lower  body dressing.  She requires min to mod assist all cognitive task including memory, problem-solving, insight and attention.  Family education has been completed regarding all aspects of mobility and care with son and daughter-in-law.    Disposition: Home  Diet: Heart healthy/Low salt  Special Instructions: 1. Family to offer supplements during the day.  2. Perform foley care twice a day.  3. Needs assistance with all activity.  4.  Repeat CBC and BMET on 5/28 with results to Dr.Schultz and Dr. Hulda Humphrey.   Discharge Instructions    Ambulatory referral to Physical Medicine Rehab   Complete by:  As directed    1-2 weeks transitional care appt     Allergies as of 10/10/2018      Reactions   Vancomycin Rash      Medication List    STOP taking these medications   allopurinol 100 MG tablet Commonly known as:  ZYLOPRIM   cefdinir 300 MG capsule Commonly known as:  OMNICEF   Fish Oil 1200 MG Caps   niacin 500 MG CR capsule  silver sulfADIAZINE 1 % cream Commonly known as:  Silvadene     TAKE these medications   amiodarone 100 MG tablet Commonly known as:  PACERONE Take 1 tablet (100 mg total) by mouth daily.   amoxicillin 500 MG capsule Commonly known as:  AMOXIL Take 1 capsule (500 mg total) by mouth at bedtime.   apixaban 2.5 MG Tabs tablet Commonly known as:  Eliquis Take 1 tablet (2.5 mg total) by mouth 2 (two) times daily.   atorvastatin 10 MG tablet Commonly known as:  LIPITOR Take 1 tablet (10 mg total) by mouth daily.   Calcium-D 600-400 MG-UNIT Tabs Take 1 tablet by mouth 2 (two) times daily. What changed:    medication strength  when to take this   feeding supplement (PRO-STAT SUGAR FREE 64) Liqd Take 30 mLs by mouth 2 (two) times daily.   hydrALAZINE 25 MG tablet Commonly known as:  APRESOLINE Take 1 tablet (25 mg total) by mouth 3 (three) times daily.   isosorbide dinitrate 10 MG tablet Commonly known as:  ISORDIL Take 1 tablet (10 mg  total) by mouth 3 (three) times daily.   levothyroxine 75 MCG tablet Commonly known as:  SYNTHROID Take 1 tablet (75 mcg total) by mouth daily before breakfast.   Medihoney Wound/Burn Dressing Gel Apply a small amount to wounds daily   megestrol 400 MG/10ML suspension Commonly known as:  MEGACE Take 10 mLs (400 mg total) by mouth daily. Start taking on:  Oct 11, 2018   Multi-Vitamins Tabs Take 1 tablet by mouth daily.   polyethylene glycol 17 g packet Commonly known as:  MIRALAX / GLYCOLAX Take 17 g by mouth daily. For constipation. Start taking on:  Oct 11, 2018   tamsulosin 0.4 MG Caps capsule Commonly known as:  FLOMAX Take 1 capsule (0.4 mg total) by mouth daily after supper. Notes to patient:  To help with bladder function.       Follow-up Information    Jamse Arn, MD Follow up.   Specialty:  Physical Medicine and Rehabilitation Why:  OFfice will call you with follow up appointment Contact information: [address] [place] [address] [phone]        Kerry Fort., MD Follow up on 10/14/2018.   Specialty:  Urology Why:  Be there at 9:50 for 10 am appointment Contact information: [address] [place] [address] [phone]        Nicoletta Dress, MD. Call on 10/11/2018.   Specialty:  Internal Medicine Why:  for post hospital follow up in 1-2 weeks Contact information: [place] [address] [phone]        Richardo Priest, MD Follow up on 10/11/2018.   Specialty:  Cardiology Why:  For follow up on cardiac issues.  Contact information: [place] [address] [phone]           Signed: Bary Leriche 10/10/2018, 12:06 PM Patient seen and examined by me on day of discharge. Delice Lesch, MD, ABPMR

## 2018-10-10 NOTE — Progress Notes (Signed)
Progress Note  Patient Name: Maureen Stout Date of Encounter: 10/10/2018  Primary Cardiologist: Shirlee More, MD   Subjective   She has no complaints this AM.Denies dyspnea and chest pain.  Inpatient Medications    Scheduled Meds: . amiodarone  100 mg Oral Daily  . amoxicillin  500 mg Oral QHS  . apixaban  2.5 mg Oral BID  . atorvastatin  10 mg Oral Daily  . calcium-vitamin D  1 tablet Oral BID  . feeding supplement (ENSURE ENLIVE)  237 mL Oral TID BM  . feeding supplement (PRO-STAT SUGAR FREE 64)  30 mL Oral BID  . Gerhardt's butt cream   Topical BID  . hydrALAZINE  25 mg Oral TID  . isosorbide dinitrate  10 mg Oral TID  . levothyroxine  75 mcg Oral Q0600  . mouth rinse  15 mL Mouth Rinse BID  . megestrol  400 mg Oral Daily  . polyethylene glycol  17 g Oral Daily  . sodium chloride flush  10-40 mL Intracatheter Q12H  . tamsulosin  0.4 mg Oral QPC supper   Continuous Infusions:  PRN Meds: acetaminophen, alum & mag hydroxide-simeth, bisacodyl, calcium carbonate, diphenhydrAMINE, guaiFENesin-dextromethorphan, ondansetron (ZOFRAN) IV **OR** ondansetron, polyethylene glycol, prochlorperazine **OR** prochlorperazine **OR** prochlorperazine, senna-docusate, sodium chloride flush, sodium phosphate   Vital Signs    Vitals:   10/09/18 1442 10/09/18 2010 10/10/18 0519 10/10/18 0808  BP: (!) 123/57 138/62 (!) 115/55 99/68  Pulse: 72 73 82   Resp: 17 16 18    Temp: (!) 97.4 F (36.3 C) 97.7 F (36.5 C) 98.1 F (36.7 C)   TempSrc:  Oral Oral   SpO2: 96% 97% 98%   Weight:   77.3 kg   Height:        Intake/Output Summary (Last 24 hours) at 10/10/2018 1010 Last data filed at 10/10/2018 0910 Gross per 24 hour  Intake 1317 ml  Output 2812 ml  Net -1495 ml   Last 3 Weights 10/10/2018 10/09/2018 10/08/2018  Weight (lbs) 170 lb 6.7 oz 166 lb 14.2 oz 164 lb 10.9 oz  Weight (kg) 77.3 kg 75.7 kg 74.7 kg      Telemetry    NSR - Personally Reviewed  ECG    No repeated -  Personally Reviewed  Physical Exam  Elderly and frail. GEN: No acute distress.   Neck: No JVD Cardiac: RRR, no murmurs, rubs, or gallops.  Respiratory: Clear to auscultation bilaterally. GI: Soft, nontender, non-distended  MS: No edema; No deformity. Neuro:  Nonfocal  Psych: Normal affect   Labs    Chemistry Recent Labs  Lab 10/08/18 0528 10/09/18 0624 10/10/18 0640  NA 138 140 140  K 5.7* 4.3 4.5  CL 98 101 102  CO2 25 28 28   GLUCOSE 112* 109* 118*  BUN 79* 70* 69*  CREATININE 2.73* 2.35* 2.05*  CALCIUM 8.8* 8.7* 9.0  GFRNONAA 15* 18* 22*  GFRAA 18* 21* 25*  ANIONGAP 15 11 10      Hematology Recent Labs  Lab 10/07/18 1039 10/09/18 0624 10/10/18 0640  WBC 5.4 8.3 7.8  RBC 2.17* 2.32* 2.38*  HGB 6.6* 7.2* 7.3*  HCT 20.9* 22.3* 22.9*  MCV 96.3 96.1 96.2  MCH 30.4 31.0 30.7  MCHC 31.6 32.3 31.9  RDW 18.2* 17.9* 17.4*  PLT 162 166 175    Cardiac EnzymesNo results for input(s): TROPONINI in the last 168 hours. No results for input(s): TROPIPOC in the last 168 hours.   BNPNo results for input(s): BNP, PROBNP  in the last 168 hours.   DDimer No results for input(s): DDIMER in the last 168 hours.   Radiology    No results found.  Cardiac Studies   No new studies  Patient Profile     85 y.o. female  with a hx of paroxysmal atrial fibrillation, h/osinus bradycardia beta-blocker previously held, CKD stage III, dyslipidemia, hypertension, recent urosepsis and pancreatitis,seen in CIRfor the evaluation of new onset systolic heart failure. Now with slow worsening of renal function, diuretics on hold.  Assessment & Plan    1. PAF,  Stable on current regimen -->NSR on 100 mg amiodarone. 2. Acute on chronic combined systolic and diastolic heart failure: Difficult to tell what diuretic dose is needed. Based on today's labs, would hold furosemide. Will likely need Furosemide at DC. My guess would be 20 mg daily. Kidney function will need to be monitored closely  at home. 3. CKD stage 4 - new   Overall, prognosis is guarded.      For questions or updates, please contact [facility] Please consult [email or web address] for contact info under        Signed, Sinclair Grooms, MD  10/10/2018, 10:10 AM

## 2018-10-10 NOTE — Progress Notes (Signed)
Patient discharged at 12:30 this afternoon; taken off the floor by [facility]. Patient was sent home with her son and daughter in law. Midline removed and Foley catheter inserted by this RN prior to discharge; patient tolerated well. Son and daughter in law educated on catheter care, both verbalized understanding.

## 2018-10-12 ENCOUNTER — Telehealth: Payer: Self-pay | Admitting: Registered Nurse

## 2018-10-12 NOTE — Telephone Encounter (Signed)
Placed another call to Mr. Maureen Stout son of [name]. No answer left message to return the call. [name] appointment will be changed to Maureen Stout.

## 2018-10-12 NOTE — Telephone Encounter (Signed)
Placed a call to Mr. Maryjean Morn, no answer on home or mobile number, left message. Awaiting on return call.

## 2018-10-14 DIAGNOSIS — R339 Retention of urine, unspecified: Secondary | ICD-10-CM | POA: Diagnosis not present

## 2018-10-14 DIAGNOSIS — N390 Urinary tract infection, site not specified: Secondary | ICD-10-CM | POA: Diagnosis not present

## 2018-10-16 DIAGNOSIS — I5041 Acute combined systolic (congestive) and diastolic (congestive) heart failure: Secondary | ICD-10-CM | POA: Diagnosis not present

## 2018-10-16 DIAGNOSIS — L97518 Non-pressure chronic ulcer of other part of right foot with other specified severity: Secondary | ICD-10-CM | POA: Diagnosis not present

## 2018-10-16 DIAGNOSIS — L97528 Non-pressure chronic ulcer of other part of left foot with other specified severity: Secondary | ICD-10-CM | POA: Diagnosis not present

## 2018-10-16 DIAGNOSIS — I130 Hypertensive heart and chronic kidney disease with heart failure and stage 1 through stage 4 chronic kidney disease, or unspecified chronic kidney disease: Secondary | ICD-10-CM | POA: Diagnosis not present

## 2018-10-16 DIAGNOSIS — N390 Urinary tract infection, site not specified: Secondary | ICD-10-CM | POA: Diagnosis not present

## 2018-10-16 DIAGNOSIS — N183 Chronic kidney disease, stage 3 (moderate): Secondary | ICD-10-CM | POA: Diagnosis not present

## 2018-10-16 DIAGNOSIS — E1122 Type 2 diabetes mellitus with diabetic chronic kidney disease: Secondary | ICD-10-CM | POA: Diagnosis not present

## 2018-10-16 DIAGNOSIS — E11621 Type 2 diabetes mellitus with foot ulcer: Secondary | ICD-10-CM | POA: Diagnosis not present

## 2018-10-16 DIAGNOSIS — B952 Enterococcus as the cause of diseases classified elsewhere: Secondary | ICD-10-CM | POA: Diagnosis not present

## 2018-10-17 ENCOUNTER — Telehealth: Payer: Self-pay | Admitting: *Deleted

## 2018-10-17 ENCOUNTER — Telehealth: Payer: Self-pay

## 2018-10-17 DIAGNOSIS — L97528 Non-pressure chronic ulcer of other part of left foot with other specified severity: Secondary | ICD-10-CM | POA: Diagnosis not present

## 2018-10-17 DIAGNOSIS — A419 Sepsis, unspecified organism: Secondary | ICD-10-CM | POA: Diagnosis not present

## 2018-10-17 DIAGNOSIS — N390 Urinary tract infection, site not specified: Secondary | ICD-10-CM | POA: Diagnosis not present

## 2018-10-17 DIAGNOSIS — R630 Anorexia: Secondary | ICD-10-CM | POA: Diagnosis not present

## 2018-10-17 DIAGNOSIS — D649 Anemia, unspecified: Secondary | ICD-10-CM | POA: Diagnosis not present

## 2018-10-17 DIAGNOSIS — I5041 Acute combined systolic (congestive) and diastolic (congestive) heart failure: Secondary | ICD-10-CM | POA: Diagnosis not present

## 2018-10-17 DIAGNOSIS — N183 Chronic kidney disease, stage 3 (moderate): Secondary | ICD-10-CM | POA: Diagnosis not present

## 2018-10-17 DIAGNOSIS — I130 Hypertensive heart and chronic kidney disease with heart failure and stage 1 through stage 4 chronic kidney disease, or unspecified chronic kidney disease: Secondary | ICD-10-CM | POA: Diagnosis not present

## 2018-10-17 DIAGNOSIS — B952 Enterococcus as the cause of diseases classified elsewhere: Secondary | ICD-10-CM | POA: Diagnosis not present

## 2018-10-17 DIAGNOSIS — E11621 Type 2 diabetes mellitus with foot ulcer: Secondary | ICD-10-CM | POA: Diagnosis not present

## 2018-10-17 DIAGNOSIS — N179 Acute kidney failure, unspecified: Secondary | ICD-10-CM | POA: Diagnosis not present

## 2018-10-17 DIAGNOSIS — L97518 Non-pressure chronic ulcer of other part of right foot with other specified severity: Secondary | ICD-10-CM | POA: Diagnosis not present

## 2018-10-17 DIAGNOSIS — E1122 Type 2 diabetes mellitus with diabetic chronic kidney disease: Secondary | ICD-10-CM | POA: Diagnosis not present

## 2018-10-17 DIAGNOSIS — N189 Chronic kidney disease, unspecified: Secondary | ICD-10-CM | POA: Diagnosis not present

## 2018-10-17 NOTE — Telephone Encounter (Signed)
Maureen Stout called from [facility] with a question about lab order. I do not see where anyone called but on discharge there was an order for a BMET and CBC to be drawn on 5/28 and I do not see that it was done. I assume that is what she was asking about. They will get it at their next visit with Maureen Stout.  She has an appt on 10/19/18 with Dr Posey Pronto.

## 2018-10-17 NOTE — Telephone Encounter (Signed)
Thanks

## 2018-10-17 NOTE — Telephone Encounter (Signed)
Mark, PT from [facility] called requesting verbal orders for HHPT 2wk6. Orders approved and given

## 2018-10-17 NOTE — Telephone Encounter (Signed)
Maureen Stout with [facility] called stating that they are seeing Mr Konitzer's twice weekly since she was released from hospital.  She wanted to know if there were any orders that they could have to assist with patients foot ulcers.  I told her yes we could add on twice weekly dressing changes with Medi honey.  Patients family will be calling soon to resch he missed appt.

## 2018-10-19 ENCOUNTER — Other Ambulatory Visit: Payer: Self-pay

## 2018-10-19 ENCOUNTER — Encounter: Payer: Medicare PPO | Attending: Physical Medicine & Rehabilitation | Admitting: Physical Medicine & Rehabilitation

## 2018-10-19 ENCOUNTER — Encounter: Payer: Self-pay | Admitting: Physical Medicine & Rehabilitation

## 2018-10-19 VITALS — BP 138/68 | HR 72 | Temp 96.7°F | Ht 66.0 in | Wt 159.0 lb

## 2018-10-19 DIAGNOSIS — N183 Chronic kidney disease, stage 3 unspecified: Secondary | ICD-10-CM

## 2018-10-19 DIAGNOSIS — I5041 Acute combined systolic (congestive) and diastolic (congestive) heart failure: Secondary | ICD-10-CM

## 2018-10-19 DIAGNOSIS — R269 Unspecified abnormalities of gait and mobility: Secondary | ICD-10-CM

## 2018-10-19 DIAGNOSIS — R5381 Other malaise: Secondary | ICD-10-CM | POA: Diagnosis not present

## 2018-10-19 DIAGNOSIS — R339 Retention of urine, unspecified: Secondary | ICD-10-CM

## 2018-10-19 DIAGNOSIS — I10 Essential (primary) hypertension: Secondary | ICD-10-CM

## 2018-10-19 HISTORY — DX: Unspecified abnormalities of gait and mobility: R26.9

## 2018-10-19 NOTE — Progress Notes (Signed)
Subjective:    Patient ID: LAYALI FREUND, female    DOB: 07-22-1931, 85 y.o.   MRN: [id number]  TELEHEALTH NOTE  Due to national recommendations of social distancing due to COVID 19, an audio/video telehealth visit is felt to be most appropriate for this patient at this time.  See Chart message from today for the patient's consent to telehealth from [place].     I verified that I am speaking with the correct person using two identifiers.  Location of patient: Home Location of provider: Office Method of communication: Telephone Names of participants : Zorita Pang scheduling, Marland Mcalpine obtaining consent and vitals if available Established patient Time spent on call: 15 minutes  HPI 85 year old female with history of T2DM, PAF, CKD, enterovesicular fistula, sinus bradycardia presents for hospital follow-up after receiving CIR for debility and encephalopathy secondary to multiple medical reasons.  Daughter-in-law supplements history. At discharge, her appetite has improved.  She followed up with Urology and cath remains in place. She saw PCP and is scheduled to have labs drawn.  She has not followed up with Cards. Denies falls. BPs have been relatively controlled. Edema is improving.  Therapies: 2/week DME: Previously owned Mobility: Walker at all times  Pain Inventory Average Pain 0 Pain Right Now 0 My pain is na  In the last 24 hours, has pain interfered with the following? General activity 0 Relation with others 0 Enjoyment of life 0 What TIME of day is your pain at its worst? na Sleep (in general) na  Pain is worse with: na Pain improves with: na Relief from Meds: na  Mobility walk without assistance use a walker ability to climb steps?  no do you drive?  no  Function retired I need assistance with the following:  dressing, bathing, toileting, meal prep, household duties and shopping  Neuro/Psych trouble walking  Prior  Studies Any changes since last visit?  no  Physicians involved in your care Any changes since last visit?  no   Family History  Problem Relation Age of Onset  . Cancer Father   . Breast cancer Sister   . Diabetes Neg Hx   . Heart attack Neg Hx    Social History   Socioeconomic History  . Marital status: Married    Spouse name: Not on file  . Number of children: Not on file  . Years of education: Not on file  . Highest education level: Not on file  Occupational History  . Not on file  Social Needs  . Financial resource strain: Not on file  . Food insecurity:    Worry: Not on file    Inability: Not on file  . Transportation needs:    Medical: Not on file    Non-medical: Not on file  Tobacco Use  . Smoking status: Never Smoker  . Smokeless tobacco: Never Used  Substance and Sexual Activity  . Alcohol use: No  . Drug use: No  . Sexual activity: Not on file  Lifestyle  . Physical activity:    Days per week: Not on file    Minutes per session: Not on file  . Stress: Not on file  Relationships  . Social connections:    Talks on phone: Not on file    Gets together: Not on file    Attends religious service: Not on file    Active member of club or organization: Not on file    Attends meetings of  clubs or organizations: Not on file    Relationship status: Not on file  Other Topics Concern  . Not on file  Social History Narrative  . Not on file   Past Surgical History:  Procedure Laterality Date  . PARTIAL HYSTERECTOMY    . TOE SURGERY    . TONSILLECTOMY     Past Medical History:  Diagnosis Date  . Acute on chronic anemia 09/07/2018  . Bladder problem   . CKD (chronic kidney disease), stage III ([place])   . Diabetes mellitus ([place])   . Dyslipidemia   . Hypertension   . Paroxysmal A-fib ([place])   . Paroxysmal atrial flutter ([place])   . Sinus bradycardia    BP 138/68   Pulse 72   Temp (!) 96.7 F (35.9 C)   Ht 5\' 6"  (1.676 m)   Wt 159 lb (72.1 kg)   SpO2 95%    BMI 25.66 kg/m   Opioid Risk Score:   Fall Risk Score:  `1  Depression screen PHQ 2/9  No flowsheet data found.   Review of Systems  Constitutional: Negative.   HENT: Negative.   Eyes: Negative.   Respiratory: Negative.   Cardiovascular: Negative.   Gastrointestinal: Negative.   Endocrine: Negative.   Genitourinary: Negative.   Musculoskeletal: Positive for gait problem.  Skin: Negative.   Allergic/Immunologic: Negative.   Hematological: Negative.   Psychiatric/Behavioral: Negative.   All other systems reviewed and are negative.      Objective:   Physical Exam Gen: NAD. Pulm: Effort normal Neuro: Alert and oriented    Assessment & Plan:  85 year old female with history of T2DM, PAF, CKD, enterovesicular fistula, sinus bradycardia presents for hospital follow-up after receiving CIR for debility and encephalopathy secondary to multiple medical reasons.  1.  Functional deficits and weakness secondary to debility and encephalopathy after urosepsis/pancreatitis and multiple medical issues  Cont therapies, encourage HEP  2. Enterococcus bacteremia/UTI:   Cont prophylactic amoxacillin 500 mg nightly   3. Urinary retention with recurrent UTIs:   Cont follow up with Urology, remains with foley at present  4. Acute on Chronic renal failure:    Patient to follow up with Nephro - plan to obtain lans  5.  Hypertension  Cont meds  Controlled at present  6.  Acute combined CHF  Follow up with Cards - needs appointment  Weight slowly improving  7. Gait abnormality  Cont walker for safety  Cont therapies  Meds reviewed Referrals reviewed All questions answered

## 2018-10-20 DIAGNOSIS — N390 Urinary tract infection, site not specified: Secondary | ICD-10-CM | POA: Diagnosis not present

## 2018-10-20 DIAGNOSIS — N183 Chronic kidney disease, stage 3 (moderate): Secondary | ICD-10-CM | POA: Diagnosis not present

## 2018-10-20 DIAGNOSIS — E11621 Type 2 diabetes mellitus with foot ulcer: Secondary | ICD-10-CM | POA: Diagnosis not present

## 2018-10-20 DIAGNOSIS — L97528 Non-pressure chronic ulcer of other part of left foot with other specified severity: Secondary | ICD-10-CM | POA: Diagnosis not present

## 2018-10-20 DIAGNOSIS — E559 Vitamin D deficiency, unspecified: Secondary | ICD-10-CM | POA: Diagnosis not present

## 2018-10-20 DIAGNOSIS — I130 Hypertensive heart and chronic kidney disease with heart failure and stage 1 through stage 4 chronic kidney disease, or unspecified chronic kidney disease: Secondary | ICD-10-CM | POA: Diagnosis not present

## 2018-10-20 DIAGNOSIS — B952 Enterococcus as the cause of diseases classified elsewhere: Secondary | ICD-10-CM | POA: Diagnosis not present

## 2018-10-20 DIAGNOSIS — I5041 Acute combined systolic (congestive) and diastolic (congestive) heart failure: Secondary | ICD-10-CM | POA: Diagnosis not present

## 2018-10-20 DIAGNOSIS — L97518 Non-pressure chronic ulcer of other part of right foot with other specified severity: Secondary | ICD-10-CM | POA: Diagnosis not present

## 2018-10-20 DIAGNOSIS — E1122 Type 2 diabetes mellitus with diabetic chronic kidney disease: Secondary | ICD-10-CM | POA: Diagnosis not present

## 2018-10-21 DIAGNOSIS — R339 Retention of urine, unspecified: Secondary | ICD-10-CM | POA: Diagnosis not present

## 2018-10-21 DIAGNOSIS — N184 Chronic kidney disease, stage 4 (severe): Secondary | ICD-10-CM | POA: Diagnosis not present

## 2018-10-21 DIAGNOSIS — I5022 Chronic systolic (congestive) heart failure: Secondary | ICD-10-CM | POA: Diagnosis not present

## 2018-10-21 DIAGNOSIS — N179 Acute kidney failure, unspecified: Secondary | ICD-10-CM | POA: Diagnosis not present

## 2018-10-21 DIAGNOSIS — I129 Hypertensive chronic kidney disease with stage 1 through stage 4 chronic kidney disease, or unspecified chronic kidney disease: Secondary | ICD-10-CM | POA: Diagnosis not present

## 2018-10-21 DIAGNOSIS — R001 Bradycardia, unspecified: Secondary | ICD-10-CM | POA: Diagnosis not present

## 2018-10-21 DIAGNOSIS — E559 Vitamin D deficiency, unspecified: Secondary | ICD-10-CM | POA: Diagnosis not present

## 2018-10-21 DIAGNOSIS — R809 Proteinuria, unspecified: Secondary | ICD-10-CM | POA: Diagnosis not present

## 2018-10-21 DIAGNOSIS — E1122 Type 2 diabetes mellitus with diabetic chronic kidney disease: Secondary | ICD-10-CM | POA: Diagnosis not present

## 2018-10-22 DIAGNOSIS — E11621 Type 2 diabetes mellitus with foot ulcer: Secondary | ICD-10-CM | POA: Diagnosis not present

## 2018-10-22 DIAGNOSIS — I130 Hypertensive heart and chronic kidney disease with heart failure and stage 1 through stage 4 chronic kidney disease, or unspecified chronic kidney disease: Secondary | ICD-10-CM | POA: Diagnosis not present

## 2018-10-22 DIAGNOSIS — L97528 Non-pressure chronic ulcer of other part of left foot with other specified severity: Secondary | ICD-10-CM | POA: Diagnosis not present

## 2018-10-22 DIAGNOSIS — E1122 Type 2 diabetes mellitus with diabetic chronic kidney disease: Secondary | ICD-10-CM | POA: Diagnosis not present

## 2018-10-22 DIAGNOSIS — N390 Urinary tract infection, site not specified: Secondary | ICD-10-CM | POA: Diagnosis not present

## 2018-10-22 DIAGNOSIS — I5041 Acute combined systolic (congestive) and diastolic (congestive) heart failure: Secondary | ICD-10-CM | POA: Diagnosis not present

## 2018-10-22 DIAGNOSIS — B952 Enterococcus as the cause of diseases classified elsewhere: Secondary | ICD-10-CM | POA: Diagnosis not present

## 2018-10-22 DIAGNOSIS — L97518 Non-pressure chronic ulcer of other part of right foot with other specified severity: Secondary | ICD-10-CM | POA: Diagnosis not present

## 2018-10-22 DIAGNOSIS — N183 Chronic kidney disease, stage 3 (moderate): Secondary | ICD-10-CM | POA: Diagnosis not present

## 2018-10-24 DIAGNOSIS — I5041 Acute combined systolic (congestive) and diastolic (congestive) heart failure: Secondary | ICD-10-CM | POA: Diagnosis not present

## 2018-10-24 DIAGNOSIS — I130 Hypertensive heart and chronic kidney disease with heart failure and stage 1 through stage 4 chronic kidney disease, or unspecified chronic kidney disease: Secondary | ICD-10-CM | POA: Diagnosis not present

## 2018-10-24 DIAGNOSIS — E1122 Type 2 diabetes mellitus with diabetic chronic kidney disease: Secondary | ICD-10-CM | POA: Diagnosis not present

## 2018-10-24 DIAGNOSIS — L97528 Non-pressure chronic ulcer of other part of left foot with other specified severity: Secondary | ICD-10-CM | POA: Diagnosis not present

## 2018-10-24 DIAGNOSIS — N390 Urinary tract infection, site not specified: Secondary | ICD-10-CM | POA: Diagnosis not present

## 2018-10-24 DIAGNOSIS — B952 Enterococcus as the cause of diseases classified elsewhere: Secondary | ICD-10-CM | POA: Diagnosis not present

## 2018-10-24 DIAGNOSIS — L97518 Non-pressure chronic ulcer of other part of right foot with other specified severity: Secondary | ICD-10-CM | POA: Diagnosis not present

## 2018-10-24 DIAGNOSIS — N183 Chronic kidney disease, stage 3 (moderate): Secondary | ICD-10-CM | POA: Diagnosis not present

## 2018-10-24 DIAGNOSIS — E11621 Type 2 diabetes mellitus with foot ulcer: Secondary | ICD-10-CM | POA: Diagnosis not present

## 2018-10-26 DIAGNOSIS — I130 Hypertensive heart and chronic kidney disease with heart failure and stage 1 through stage 4 chronic kidney disease, or unspecified chronic kidney disease: Secondary | ICD-10-CM | POA: Diagnosis not present

## 2018-10-26 DIAGNOSIS — E11621 Type 2 diabetes mellitus with foot ulcer: Secondary | ICD-10-CM | POA: Diagnosis not present

## 2018-10-26 DIAGNOSIS — L97518 Non-pressure chronic ulcer of other part of right foot with other specified severity: Secondary | ICD-10-CM | POA: Diagnosis not present

## 2018-10-26 DIAGNOSIS — N183 Chronic kidney disease, stage 3 (moderate): Secondary | ICD-10-CM | POA: Diagnosis not present

## 2018-10-26 DIAGNOSIS — E1122 Type 2 diabetes mellitus with diabetic chronic kidney disease: Secondary | ICD-10-CM | POA: Diagnosis not present

## 2018-10-26 DIAGNOSIS — B952 Enterococcus as the cause of diseases classified elsewhere: Secondary | ICD-10-CM | POA: Diagnosis not present

## 2018-10-26 DIAGNOSIS — N390 Urinary tract infection, site not specified: Secondary | ICD-10-CM | POA: Diagnosis not present

## 2018-10-26 DIAGNOSIS — L97528 Non-pressure chronic ulcer of other part of left foot with other specified severity: Secondary | ICD-10-CM | POA: Diagnosis not present

## 2018-10-26 DIAGNOSIS — I5041 Acute combined systolic (congestive) and diastolic (congestive) heart failure: Secondary | ICD-10-CM | POA: Diagnosis not present

## 2018-10-27 ENCOUNTER — Encounter: Payer: Self-pay | Admitting: Sports Medicine

## 2018-10-27 ENCOUNTER — Other Ambulatory Visit: Payer: Self-pay

## 2018-10-27 ENCOUNTER — Ambulatory Visit (INDEPENDENT_AMBULATORY_CARE_PROVIDER_SITE_OTHER): Payer: Medicare PPO | Admitting: Sports Medicine

## 2018-10-27 VITALS — Temp 97.2°F | Resp 16

## 2018-10-27 DIAGNOSIS — E1122 Type 2 diabetes mellitus with diabetic chronic kidney disease: Secondary | ICD-10-CM | POA: Diagnosis not present

## 2018-10-27 DIAGNOSIS — N183 Chronic kidney disease, stage 3 (moderate): Secondary | ICD-10-CM | POA: Diagnosis not present

## 2018-10-27 DIAGNOSIS — B952 Enterococcus as the cause of diseases classified elsewhere: Secondary | ICD-10-CM | POA: Diagnosis not present

## 2018-10-27 DIAGNOSIS — M79674 Pain in right toe(s): Secondary | ICD-10-CM

## 2018-10-27 DIAGNOSIS — I5041 Acute combined systolic (congestive) and diastolic (congestive) heart failure: Secondary | ICD-10-CM | POA: Diagnosis not present

## 2018-10-27 DIAGNOSIS — N390 Urinary tract infection, site not specified: Secondary | ICD-10-CM | POA: Diagnosis not present

## 2018-10-27 DIAGNOSIS — M79675 Pain in left toe(s): Secondary | ICD-10-CM | POA: Diagnosis not present

## 2018-10-27 DIAGNOSIS — L97518 Non-pressure chronic ulcer of other part of right foot with other specified severity: Secondary | ICD-10-CM | POA: Diagnosis not present

## 2018-10-27 DIAGNOSIS — E1140 Type 2 diabetes mellitus with diabetic neuropathy, unspecified: Secondary | ICD-10-CM | POA: Diagnosis not present

## 2018-10-27 DIAGNOSIS — E11621 Type 2 diabetes mellitus with foot ulcer: Secondary | ICD-10-CM | POA: Diagnosis not present

## 2018-10-27 DIAGNOSIS — L97528 Non-pressure chronic ulcer of other part of left foot with other specified severity: Secondary | ICD-10-CM | POA: Diagnosis not present

## 2018-10-27 DIAGNOSIS — B351 Tinea unguium: Secondary | ICD-10-CM

## 2018-10-27 DIAGNOSIS — I130 Hypertensive heart and chronic kidney disease with heart failure and stage 1 through stage 4 chronic kidney disease, or unspecified chronic kidney disease: Secondary | ICD-10-CM | POA: Diagnosis not present

## 2018-10-27 DIAGNOSIS — L97511 Non-pressure chronic ulcer of other part of right foot limited to breakdown of skin: Secondary | ICD-10-CM

## 2018-10-27 DIAGNOSIS — L97521 Non-pressure chronic ulcer of other part of left foot limited to breakdown of skin: Secondary | ICD-10-CM

## 2018-10-27 NOTE — Progress Notes (Signed)
Subjective: Maureen Stout is a 85 y.o. female patient with history of diabetes who returns to office today for evaluation of right and left foot ulceration and for diabetic nail care. Patient reports that home nurse said wounds are healed. Patient reports that she spent 5 weeks in hospital and has home PT as well. Patient denies any other issues.   FBS not recorded.   Assisted by Daughter in law who is in waiting room.   Patient Active Problem List  Diagnosis Date Noted . Abnormality of gait 10/19/2018 . Acute blood loss anemia  . PAH (pulmonary artery hypertension) ([place])  . Acute combined systolic and diastolic congestive heart failure ([place])  . Edema  . Esophageal dysmotility  . Cardiomegaly  . Failure to thrive (child)  . Recurrent UTI  . CKD (chronic kidney disease), stage III ([place])  . Anemia of chronic disease  . Acute on chronic kidney failure ([place])  . Urinary retention  . Debility 09/19/2018 . Sepsis due to Enterobacter species ([place]) 09/08/2018 . Bacteremia 09/08/2018 . Hypoglycemia without diagnosis of diabetes mellitus 09/08/2018 . Acute on chronic anemia 09/07/2018 . Acute pancreatitis without infection or necrosis 09/07/2018 . Bradycardia 09/07/2018 . Sinus bradycardia 09/06/2018 . Hypothyroidism 09/06/2018 . First degree AV block 09/06/2018 . Abnormal CXR 04/13/2018 . On amiodarone therapy 01/08/2018 . Chronic anticoagulation 01/08/2018 . Hypertensive heart disease 01/08/2018 . Pressure injury of skin 04/19/2017 . Paroxysmal atrial fibrillation ([place]) 04/18/2017  Current Outpatient Medications on File Prior to Visit Medication Sig Dispense Refill . amiodarone (PACERONE) 100 MG tablet Take 1 tablet (100 mg total) by mouth daily. 30 tablet 1 . amoxicillin (AMOXIL) 500 MG capsule Take 1 capsule (500 mg total) by mouth at bedtime. 30 capsule 1 . apixaban (ELIQUIS) 2.5 MG TABS tablet Take 1 tablet (2.5 mg total) by mouth 2 (two) times daily. 60 tablet 1 . atorvastatin  (LIPITOR) 10 MG tablet Take 1 tablet (10 mg total) by mouth daily. 30 tablet 1 . Calcium Carbonate-Vitamin D (CALCIUM-D) 600-400 MG-UNIT TABS Take 1 tablet by mouth 2 (two) times daily. 60 tablet 1 . furosemide (LASIX) 40 MG tablet    . hydrALAZINE (APRESOLINE) 25 MG tablet Take 1 tablet (25 mg total) by mouth 3 (three) times daily. 90 tablet 1 . isosorbide dinitrate (ISORDIL) 10 MG tablet Take 1 tablet (10 mg total) by mouth 3 (three) times daily. 90 tablet 1 . levothyroxine (SYNTHROID) 50 MCG tablet    . levothyroxine (SYNTHROID) 75 MCG tablet Take 1 tablet (75 mcg total) by mouth daily before breakfast. 30 tablet 1 . Multiple Vitamin (MULTI-VITAMINS) TABS Take 1 tablet by mouth daily.    . polyethylene glycol (MIRALAX / GLYCOLAX) 17 g packet Take 17 g by mouth daily. For constipation. 30 each 0 . Wound Dressings (MEDIHONEY WOUND/BURN DRESSING) GEL Apply a small amount to wounds daily 44 mL 1  No current facility-administered medications on file prior to visit.   Allergies Allergen Reactions . Vancomycin Rash   Recent Results (from the past 2160 hour(s)) Comprehensive metabolic panel     Status: Abnormal  Collection Time: 09/06/18  2:24 PM Result Value Ref Range  Sodium 141 135 - 145 mmol/L  Potassium 4.8 3.5 - 5.1 mmol/L  Chloride 109 98 - 111 mmol/L  CO2 21 (L) 22 - 32 mmol/L  Glucose, Bld 80 70 - 99 mg/dL  BUN 44 (H) 8 - 23 mg/dL  Creatinine, Ser 2.86 (H) 0.44 - 1.00 mg/dL  Calcium 9.5 8.9 - 10.3 mg/dL  Total Protein  6.2 (L) 6.5 - 8.1 g/dL  Albumin 3.1 (L) 3.5 - 5.0 g/dL  AST 34 15 - 41 U/L  ALT 43 0 - 44 U/L  Alkaline Phosphatase 91 38 - 126 U/L  Total Bilirubin 0.5 0.3 - 1.2 mg/dL  GFR calc non Af Amer 15 (L) >60 mL/min  GFR calc Af Amer 17 (L) >60 mL/min  Anion gap 11 5 - 15   Comment: Performed at [facility] [address] [place], [place] [address] CBC with Differential     Status: Abnormal  Collection Time: 09/06/18  2:24 PM Result Value Ref  Range  WBC 4.8 4.0 - 10.5 K/uL  RBC 2.95 (L) 3.87 - 5.11 MIL/uL  Hemoglobin 8.8 (L) 12.0 - 15.0 g/dL  HCT 28.4 (L) 36.0 - 46.0 %  MCV 96.3 80.0 - 100.0 fL  MCH 29.8 26.0 - 34.0 pg  MCHC 31.0 30.0 - 36.0 g/dL  RDW 17.3 (H) 11.5 - 15.5 %  Platelets 186 150 - 400 K/uL  nRBC 0.4 (H) 0.0 - 0.2 %  Neutrophils Relative % 65 %  Neutro Abs 3.1 1.7 - 7.7 K/uL  Lymphocytes Relative 28 %  Lymphs Abs 1.4 0.7 - 4.0 K/uL  Monocytes Relative 6 %  Monocytes Absolute 0.3 0.1 - 1.0 K/uL  Eosinophils Relative 0 %  Eosinophils Absolute 0.0 0.0 - 0.5 K/uL  Basophils Relative 0 %  Basophils Absolute 0.0 0.0 - 0.1 K/uL  Immature Granulocytes 1 %  Abs Immature Granulocytes 0.04 0.00 - 0.07 K/uL   Comment: Performed at [facility], [address], [place], [place] [address] Troponin I - Once     Status: None  Collection Time: 09/06/18  2:24 PM Result Value Ref Range  Troponin I <0.03 <0.03 ng/mL   Comment: Performed at [facility] [address], [place], [place] [address] CBG monitoring, ED     Status: None  Collection Time: 09/06/18  2:25 PM Result Value Ref Range  Glucose-Capillary 80 70 - 99 mg/dL  Comment 1 Notify RN   Comment 2 Document in Chart  TSH     Status: Abnormal  Collection Time: 09/06/18  2:26 PM Result Value Ref Range  TSH 9.219 (H) 0.350 - 4.500 uIU/mL   Comment: Performed by a 3rd Generation assay with a functional sensitivity of <=0.01 uIU/mL. Performed at [facility], [place] [address] [place], [place] [address]  Cortisol     Status: None  Collection Time: 09/06/18  2:26 PM Result Value Ref Range  Cortisol, Plasma 13.9 ug/dL   Comment: (NOTE) AM    6.7 - 22.6 ug/dL PM   <10.0       ug/dL Performed at [facility] [address], [place], [place] [address]  I-stat Creatinine, ED     Status: Abnormal  Collection Time: 09/06/18  2:43 PM Result Value Ref Range  Creatinine, Ser 2.80 (H) 0.44 - 1.00 mg/dL POCT I-Stat EG7     Status:  Abnormal  Collection Time: 09/06/18  2:44 PM Result Value Ref Range  pH, Ven 7.353 7.250 - 7.430  pCO2, Ven 44.6 44.0 - 60.0 mmHg  pO2, Ven 88.0 (H) 32.0 - 45.0 mmHg  Bicarbonate 24.8 20.0 - 28.0 mmol/L  TCO2 26 22 - 32 mmol/L  O2 Saturation 96.0 %  Acid-base deficit 1.0 0.0 - 2.0 mmol/L  Sodium 141 135 - 145 mmol/L  Potassium 4.7 3.5 - 5.1 mmol/L  Calcium, Ion 1.25 1.15 - 1.40 mmol/L  HCT 27.0 (L) 36.0 - 46.0 %  Hemoglobin 9.2 (L) 12.0 - 15.0 g/dL  Patient temperature HIDE   Sample type VENOUS  Urinalysis, Routine w reflex microscopic     Status: Abnormal  Collection Time: 09/06/18  6:40 PM Result Value Ref Range  Color, Urine YELLOW YELLOW  APPearance HAZY (A) CLEAR  Specific Gravity, Urine 1.012 1.005 - 1.030  pH 5.0 5.0 - 8.0  Glucose, UA NEGATIVE NEGATIVE mg/dL  Hgb urine dipstick SMALL (A) NEGATIVE  Bilirubin Urine NEGATIVE NEGATIVE  Ketones, ur NEGATIVE NEGATIVE mg/dL  Protein, ur NEGATIVE NEGATIVE mg/dL  Nitrite NEGATIVE NEGATIVE  Leukocytes,Ua SMALL (A) NEGATIVE  RBC / HPF 6-10 0 - 5 RBC/hpf  WBC, UA 6-10 0 - 5 WBC/hpf  Bacteria, UA FEW (A) NONE SEEN  Squamous Epithelial / LPF 0-5 0 - 5   Comment: Performed at [facility], [address], [place], [place] [address] Glucose, capillary     Status: Abnormal  Collection Time: 09/06/18  6:46 PM Result Value Ref Range  Glucose-Capillary 65 (L) 70 - 99 mg/dL Urine culture     Status: Abnormal  Collection Time: 09/06/18  6:54 PM  Specimen: Urine, Random Result Value Ref Range  Specimen Description URINE, RANDOM   Special Requests     NONE Performed at [facility], [place] [address], [place], [place] [address]   Culture >=100,000 COLONIES/mL ENTEROBACTER SPECIES (A)   Report Status 09/08/2018 FINAL   Organism ID, Bacteria ENTEROBACTER SPECIES (A)      Susceptibility  Enterobacter species - MIC*   CEFAZOLIN >=64 RESISTANT Resistant    CEFTRIAXONE <=1 SENSITIVE Sensitive    CIPROFLOXACIN <=0.25  SENSITIVE Sensitive    GENTAMICIN >=16 RESISTANT Resistant    IMIPENEM 0.5 SENSITIVE Sensitive    NITROFURANTOIN 64 INTERMEDIATE Intermediate    TRIMETH/SULFA >=320 RESISTANT Resistant    PIP/TAZO 32 INTERMEDIATE Intermediate    * >=100,000 COLONIES/mL ENTEROBACTER SPECIES Glucose, capillary     Status: Abnormal  Collection Time: 09/06/18  7:18 PM Result Value Ref Range  Glucose-Capillary 69 (L) 70 - 99 mg/dL Brain natriuretic peptide     Status: Abnormal  Collection Time: 09/06/18  7:36 PM Result Value Ref Range  B Natriuretic Peptide 192.9 (H) 0.0 - 100.0 pg/mL   Comment: Performed at [facility] [address] [place], [place] [address] Troponin I - Now Then Q6H     Status: None  Collection Time: 09/06/18  7:36 PM Result Value Ref Range  Troponin I <0.03 <0.03 ng/mL   Comment: Performed at [facility] [address], [place], [place] [address] Glucose, capillary     Status: None  Collection Time: 09/06/18  7:55 PM Result Value Ref Range  Glucose-Capillary 89 70 - 99 mg/dL Glucose, capillary     Status: None  Collection Time: 09/06/18  9:17 PM Result Value Ref Range  Glucose-Capillary 87 70 - 99 mg/dL Glucose, capillary     Status: None  Collection Time: 09/06/18 11:29 PM Result Value Ref Range  Glucose-Capillary 88 70 - 99 mg/dL Blood culture (routine x 2)     Status: None  Collection Time: 09/07/18 12:00 AM  Specimen: BLOOD Result Value Ref Range  Specimen Description BLOOD LEFT ANTECUBITAL   Special Requests     BOTTLES DRAWN AEROBIC ONLY Blood Culture adequate volume  Culture     NO GROWTH 5 DAYS Performed at [facility], [place] [address], [place], [place] [address]   Report  Status 09/12/2018 FINAL  Troponin I - Now Then Q6H     Status: None  Collection Time: 09/07/18 12:05 AM Result Value Ref Range  Troponin I <0.03 <0.03 ng/mL   Comment: Performed at [facility] [address], [place], [place] [address] T4, free     Status:  None  Collection Time: 09/07/18 12:05 AM Result Value Ref Range  Free T4 1.14 0.82 - 1.77 ng/dL   Comment: (NOTE) Biotin ingestion may interfere with free T4 tests. If the results are inconsistent with the TSH level, previous test results, or the clinical presentation, then consider biotin interference. If needed, order repeat testing after stopping biotin. Performed at [facility], [place] [address] [place], [place] [address]  Blood culture (routine x 2)     Status: Abnormal  Collection Time: 09/07/18 12:05 AM  Specimen: BLOOD Result Value Ref Range  Specimen Description BLOOD LEFT ANTECUBITAL   Special Requests     BOTTLES DRAWN AEROBIC ONLY Blood Culture adequate volume  Culture  Setup Time     GRAM NEGATIVE RODS AEROBIC BOTTLE ONLY CRITICAL RESULT CALLED TO, READ BACK BY AND VERIFIED WITHDiona Browner PHARMD 1835 09/07/18 A BROWNING Performed at [facility], [place] [address], [place], [place] [address]   Culture ENTEROBACTER CLOACAE (A)   Report Status 09/09/2018 FINAL   Organism ID, Bacteria ENTEROBACTER CLOACAE      Susceptibility  Enterobacter cloacae - MIC*   CEFAZOLIN >=64 RESISTANT Resistant    CEFEPIME <=1 SENSITIVE Sensitive    CEFTAZIDIME 4 SENSITIVE Sensitive    CEFTRIAXONE <=1 SENSITIVE Sensitive    CIPROFLOXACIN <=0.25 SENSITIVE Sensitive    GENTAMICIN >=16 RESISTANT Resistant    IMIPENEM 1 SENSITIVE Sensitive    TRIMETH/SULFA >=320 RESISTANT Resistant    PIP/TAZO 64 INTERMEDIATE Intermediate    * ENTEROBACTER CLOACAE Blood Culture ID Panel (Reflexed)     Status: Abnormal  Collection Time: 09/07/18 12:05 AM Result Value Ref Range  Enterococcus species NOT DETECTED NOT DETECTED  Listeria monocytogenes NOT DETECTED NOT DETECTED  Staphylococcus species NOT DETECTED NOT DETECTED  Staphylococcus aureus (BCID) NOT DETECTED NOT DETECTED  Streptococcus species NOT DETECTED NOT DETECTED  Streptococcus agalactiae NOT DETECTED NOT DETECTED  Streptococcus  pneumoniae NOT DETECTED NOT DETECTED  Streptococcus pyogenes NOT DETECTED NOT DETECTED  Acinetobacter baumannii NOT DETECTED NOT DETECTED  Enterobacteriaceae species DETECTED (A) NOT DETECTED   Comment: Enterobacteriaceae represent a large family of gram-negative bacteria, not a single organism. CRITICAL RESULT CALLED TO, READ BACK BY AND VERIFIED WITH: T DANG PHARMD 1835 09/07/18 A BROWNING   Enterobacter cloacae complex DETECTED (A) NOT DETECTED   Comment: CRITICAL RESULT CALLED TO, READ BACK BY AND VERIFIED WITH: T DANG PHARMD 1835 09/07/18 A BROWNING   Escherichia coli NOT DETECTED NOT DETECTED  Klebsiella oxytoca NOT DETECTED NOT DETECTED  Klebsiella pneumoniae NOT DETECTED NOT DETECTED  Proteus species NOT DETECTED NOT DETECTED  Serratia marcescens NOT DETECTED NOT DETECTED  Carbapenem resistance NOT DETECTED NOT DETECTED  Haemophilus influenzae NOT DETECTED NOT DETECTED  Neisseria meningitidis NOT DETECTED NOT DETECTED  Pseudomonas aeruginosa NOT DETECTED NOT DETECTED  Candida albicans NOT DETECTED NOT DETECTED  Candida glabrata NOT DETECTED NOT DETECTED  Candida krusei NOT DETECTED NOT DETECTED  Candida parapsilosis NOT DETECTED NOT DETECTED  Candida tropicalis NOT DETECTED NOT DETECTED   Comment: Performed at [facility], [address] [place], [place] [address] Glucose, capillary     Status: Abnormal  Collection Time: 09/07/18  4:53 AM Result Value Ref Range  Glucose-Capillary 59 (L) 70 - 99 mg/dL Glucose, capillary     Status: Abnormal  Collection Time: 09/07/18  5:21 AM Result Value Ref Range  Glucose-Capillary 63 (L) 70 - 99 mg/dL Basic metabolic panel     Status: Abnormal  Collection Time: 09/07/18  5:54 AM Result Value Ref Range  Sodium 140 135 - 145 mmol/L  Potassium 5.0 3.5 - 5.1 mmol/L  Chloride 108 98 - 111 mmol/L  CO2 20 (L) 22 - 32 mmol/L  Glucose, Bld 72 70 - 99 mg/dL  BUN 46 (H) 8 - 23 mg/dL  Creatinine, Ser 3.05 (H) 0.44 - 1.00  mg/dL  Calcium 9.3 8.9 - 10.3 mg/dL  GFR calc non Af Amer 13 (L) >60 mL/min  GFR calc Af Amer 16 (L) >60 mL/min  Anion gap 12 5 - 15   Comment: Performed at [facility], [place] [address] [place], [place] [address] CBC     Status: Abnormal  Collection Time: 09/07/18  5:54 AM Result Value Ref Range  WBC 6.0 4.0 - 10.5 K/uL  RBC 3.01 (L) 3.87 - 5.11 MIL/uL  Hemoglobin 9.0 (L) 12.0 - 15.0 g/dL  HCT 28.6 (L) 36.0 - 46.0 %  MCV 95.0 80.0 - 100.0 fL  MCH 29.9 26.0 - 34.0 pg  MCHC 31.5 30.0 - 36.0 g/dL  RDW 17.4 (H) 11.5 - 15.5 %  Platelets 168 150 - 400 K/uL  nRBC 0.0 0.0 - 0.2 %   Comment: Performed at [facility], [place] [address] [place], [place] [address] Troponin I - Now Then Q6H     Status: None  Collection Time: 09/07/18  5:54 AM Result Value Ref Range  Troponin I <0.03 <0.03 ng/mL   Comment: Performed at [facility] [address], [place], [place] [address] Hepatic function panel     Status: Abnormal  Collection Time: 09/07/18  5:54 AM Result Value Ref Range  Total Protein 5.4 (L) 6.5 - 8.1 g/dL  Albumin 2.6 (L) 3.5 - 5.0 g/dL  AST 30 15 - 41 U/L  ALT 37 0 - 44 U/L  Alkaline Phosphatase 80 38 - 126 U/L  Total Bilirubin 0.4 0.3 - 1.2 mg/dL  Bilirubin, Direct <0.1 0.0 - 0.2 mg/dL  Indirect Bilirubin NOT CALCULATED 0.3 - 0.9 mg/dL   Comment: Performed at [facility] [address] [place], [place] [address] Lipase, blood     Status: Abnormal  Collection Time: 09/07/18  5:54 AM Result Value Ref Range  Lipase 311 (H) 11 - 51 U/L   Comment: Performed at [facility], [place] [address], [place], [place] [address] Glucose, capillary     Status: Abnormal  Collection Time: 09/07/18  5:59 AM Result Value Ref Range  Glucose-Capillary 57 (L) 70 - 99 mg/dL Glucose, capillary     Status: Abnormal  Collection Time: 09/07/18  6:28 AM Result Value Ref Range  Glucose-Capillary 109 (H) 70 - 99 mg/dL Glucose, capillary     Status: None  Collection Time:  09/07/18  8:20 AM Result Value Ref Range  Glucose-Capillary 96 70 - 99 mg/dL Glucose, capillary     Status: Abnormal  Collection Time: 09/07/18 12:36 PM Result Value Ref Range  Glucose-Capillary 116 (H) 70 - 99 mg/dL Glucose, capillary     Status: None  Collection Time: 09/07/18  5:21 PM Result Value Ref Range  Glucose-Capillary 89 70 - 99 mg/dL Glucose, capillary  Status: None  Collection Time: 09/07/18  8:01 PM Result Value Ref Range  Glucose-Capillary 90 70 - 99 mg/dL Heparin level (unfractionated)     Status: Abnormal  Collection Time: 09/07/18  9:31 PM Result Value Ref Range  Heparin Unfractionated >2.20 (H) 0.30 - 0.70 IU/mL   Comment: RESULTS CONFIRMED BY MANUAL DILUTION (NOTE) If heparin results are below expected values, and patient dosage has  been confirmed, suggest follow up testing of antithrombin III levels. Performed at [facility], [place] [address], [place], [place] [address]  APTT     Status: Abnormal  Collection Time: 09/07/18  9:31 PM Result Value Ref Range  aPTT >200 (HH) 24 - 36 seconds   Comment:        IF BASELINE aPTT IS ELEVATED, SUGGEST PATIENT RISK ASSESSMENT BE USED TO DETERMINE APPROPRIATE ANTICOAGULANT THERAPY. REPEATED TO VERIFY CRITICAL RESULT CALLED TO, READ BACK BY AND VERIFIED WITH: B BUCKNER,RN 2239 09/07/2018 D BRADLEY Performed at [facility], [place] [address], [place], [place] [address]  Glucose, capillary     Status: None  Collection Time: 09/08/18 12:07 AM Result Value Ref Range  Glucose-Capillary 70 70 - 99 mg/dL Glucose, capillary     Status: Abnormal  Collection Time: 09/08/18  3:55 AM Result Value Ref Range  Glucose-Capillary 64 (L) 70 - 99 mg/dL Glucose, capillary     Status: None  Collection Time: 09/08/18  4:39 AM Result Value Ref Range  Glucose-Capillary 94 70 - 99 mg/dL Basic metabolic panel     Status: Abnormal  Collection Time: 09/08/18  5:29 AM Result Value Ref Range  Sodium 137 135 - 145  mmol/L  Potassium 5.0 3.5 - 5.1 mmol/L  Chloride 107 98 - 111 mmol/L  CO2 22 22 - 32 mmol/L  Glucose, Bld 81 70 - 99 mg/dL  BUN 50 (H) 8 - 23 mg/dL  Creatinine, Ser 3.24 (H) 0.44 - 1.00 mg/dL  Calcium 8.9 8.9 - 10.3 mg/dL  GFR calc non Af Amer 12 (L) >60 mL/min  GFR calc Af Amer 14 (L) >60 mL/min  Anion gap 8 5 - 15   Comment: Performed at [facility], [place] [address], [place], [place] [address] CBC with Differential     Status: Abnormal  Collection Time: 09/08/18  5:29 AM Result Value Ref Range  WBC 11.3 (H) 4.0 - 10.5 K/uL  RBC 2.74 (L) 3.87 - 5.11 MIL/uL  Hemoglobin 8.3 (L) 12.0 - 15.0 g/dL  HCT 26.1 (L) 36.0 - 46.0 %  MCV 95.3 80.0 - 100.0 fL  MCH 30.3 26.0 - 34.0 pg  MCHC 31.8 30.0 - 36.0 g/dL  RDW 17.6 (H) 11.5 - 15.5 %  Platelets 169 150 - 400 K/uL  nRBC 0.2 0.0 - 0.2 %  Neutrophils Relative % 93 %  Neutro Abs 10.5 (H) 1.7 - 7.7 K/uL  Lymphocytes Relative 6 %  Lymphs Abs 0.6 (L) 0.7 - 4.0 K/uL  Monocytes Relative 1 %  Monocytes Absolute 0.2 0.1 - 1.0 K/uL  Eosinophils Relative 0 %  Eosinophils Absolute 0.0 0.0 - 0.5 K/uL  Basophils Relative 0 %  Basophils Absolute 0.0 0.0 - 0.1 K/uL  WBC Morphology See Note    Comment: Increased Bands. >20% Bands  Immature Granulocytes 0 %  Abs Immature Granulocytes 0.04 0.00 - 0.07 K/uL   Comment: Performed at [facility], [place] [address], [place], [place] [address] Lipid panel     Status: Abnormal  Collection Time: 09/08/18  5:29  AM Result Value Ref Range  Cholesterol 81 0 - 200 mg/dL  Triglycerides 33 <150 mg/dL  HDL 37 (L) >40 mg/dL  Total CHOL/HDL Ratio 2.2 RATIO  VLDL 7 0 - 40 mg/dL  LDL Cholesterol 37 0 - 99 mg/dL   Comment:        Total Cholesterol/HDL:CHD Risk Coronary Heart Disease Risk Table                     Men   Women  1/2 Average Risk   3.4   3.3  Average Risk       5.0   4.4  2 X Average Risk   9.6   7.1  3 X Average Risk  23.4   11.0        Use the calculated Patient Ratio above and the  CHD Risk Table to determine the patient's CHD Risk.        ATP III CLASSIFICATION (LDL):  <100     mg/dL   Optimal  100-129  mg/dL   Near or Above                    Optimal  130-159  mg/dL   Borderline  160-189  mg/dL   High  >190     mg/dL   Very High Performed at [facility] [address], [place], [place] [address]  Glucose, capillary     Status: None  Collection Time: 09/08/18  5:41 AM Result Value Ref Range  Glucose-Capillary 72 70 - 99 mg/dL Glucose, capillary     Status: Abnormal  Collection Time: 09/08/18  7:55 AM Result Value Ref Range  Glucose-Capillary 49 (L) 70 - 99 mg/dL Glucose, capillary     Status: Abnormal  Collection Time: 09/08/18  9:10 AM Result Value Ref Range  Glucose-Capillary 61 (L) 70 - 99 mg/dL Glucose, capillary     Status: None  Collection Time: 09/08/18 10:29 AM Result Value Ref Range  Glucose-Capillary 75 70 - 99 mg/dL Glucose, capillary     Status: None  Collection Time: 09/08/18 11:07 AM Result Value Ref Range  Glucose-Capillary 71 70 - 99 mg/dL Glucose, capillary     Status: Abnormal  Collection Time: 09/08/18 12:46 PM Result Value Ref Range  Glucose-Capillary 52 (L) 70 - 99 mg/dL Glucose, capillary     Status: Abnormal  Collection Time: 09/08/18  1:28 PM Result Value Ref Range  Glucose-Capillary 118 (H) 70 - 99 mg/dL Glucose, capillary     Status: None  Collection Time: 09/08/18  2:13 PM Result Value Ref Range  Glucose-Capillary 97 70 - 99 mg/dL Glucose, capillary     Status: None  Collection Time: 09/08/18  5:05 PM Result Value Ref Range  Glucose-Capillary 99 70 - 99 mg/dL Glucose, capillary     Status: None  Collection Time: 09/08/18  8:38 PM Result Value Ref Range  Glucose-Capillary 70 70 - 99 mg/dL Glucose, capillary     Status: None  Collection Time: 09/08/18 10:25 PM Result Value Ref Range  Glucose-Capillary 90 70 - 99 mg/dL Glucose, capillary     Status: None  Collection Time: 09/08/18 11:45  PM Result Value Ref Range  Glucose-Capillary 81 70 - 99 mg/dL Glucose, capillary     Status: None  Collection Time: 09/09/18  4:22 AM Result Value Ref Range  Glucose-Capillary 83 70 - 99 mg/dL CBC with Differential     Status: Abnormal  Collection Time: 09/09/18  9:23 AM Result Value  Ref Range  WBC 9.6 4.0 - 10.5 K/uL  RBC 2.72 (L) 3.87 - 5.11 MIL/uL  Hemoglobin 8.2 (L) 12.0 - 15.0 g/dL  HCT 25.8 (L) 36.0 - 46.0 %  MCV 94.9 80.0 - 100.0 fL  MCH 30.1 26.0 - 34.0 pg  MCHC 31.8 30.0 - 36.0 g/dL  RDW 17.9 (H) 11.5 - 15.5 %  Platelets 138 (L) 150 - 400 K/uL  nRBC 0.0 0.0 - 0.2 %  Neutrophils Relative % 89 %  Lymphocytes Relative 9 %  Monocytes Relative 2 %  Eosinophils Relative 0 %  Basophils Relative 0 %  Band Neutrophils 0 %  Metamyelocytes Relative 0 %  Myelocytes 0 %  Promyelocytes Relative 0 %  Blasts 0 %  nRBC 0 0 /100 WBC  Other 0 %  Neutro Abs 8.5 (H) 1.7 - 7.7 K/uL  Lymphs Abs 0.9 0.7 - 4.0 K/uL  Monocytes Absolute 0.2 0.1 - 1.0 K/uL  Eosinophils Absolute 0.0 0.0 - 0.5 K/uL  Basophils Absolute 0.0 0.0 - 0.1 K/uL  RBC Morphology BURR CELLS   WBC Morphology TOXIC GRANULATION    Comment: Performed at [facility], [address] [place], [place] [address] Basic metabolic panel     Status: Abnormal  Collection Time: 09/09/18  9:23 AM Result Value Ref Range  Sodium 140 135 - 145 mmol/L  Potassium 4.5 3.5 - 5.1 mmol/L  Chloride 110 98 - 111 mmol/L  CO2 21 (L) 22 - 32 mmol/L  Glucose, Bld 92 70 - 99 mg/dL  BUN 48 (H) 8 - 23 mg/dL  Creatinine, Ser 3.33 (H) 0.44 - 1.00 mg/dL  Calcium 9.1 8.9 - 10.3 mg/dL  GFR calc non Af Amer 12 (L) >60 mL/min  GFR calc Af Amer 14 (L) >60 mL/min  Anion gap 9 5 - 15   Comment: Performed at [facility], [place] [address] [place], [place] [address] Glucose, capillary     Status: Abnormal  Collection Time: 09/09/18 11:14 AM Result Value Ref Range  Glucose-Capillary 117 (H) 70 - 99 mg/dL Glucose, capillary     Status:  None  Collection Time: 09/09/18  4:57 PM Result Value Ref Range  Glucose-Capillary 92 70 - 99 mg/dL Glucose, capillary     Status: Abnormal  Collection Time: 09/09/18  8:03 PM Result Value Ref Range  Glucose-Capillary 132 (H) 70 - 99 mg/dL Glucose, capillary     Status: None  Collection Time: 09/09/18 11:54 PM Result Value Ref Range  Glucose-Capillary 92 70 - 99 mg/dL Glucose, capillary     Status: None  Collection Time: 09/10/18  4:33 AM Result Value Ref Range  Glucose-Capillary 93 70 - 99 mg/dL CBC with Differential     Status: Abnormal  Collection Time: 09/10/18  6:10 AM Result Value Ref Range  WBC 7.9 4.0 - 10.5 K/uL  RBC 2.34 (L) 3.87 - 5.11 MIL/uL  Hemoglobin 7.1 (L) 12.0 - 15.0 g/dL  HCT 22.4 (L) 36.0 - 46.0 %  MCV 95.7 80.0 - 100.0 fL  MCH 30.3 26.0 - 34.0 pg  MCHC 31.7 30.0 - 36.0 g/dL  RDW 18.0 (H) 11.5 - 15.5 %  Platelets 108 (L) 150 - 400 K/uL   Comment: REPEATED TO VERIFY PLATELET COUNT CONFIRMED BY SMEAR SPECIMEN CHECKED FOR CLOTS Immature Platelet Fraction may be clinically indicated, consider ordering this additional test [id number]   nRBC 0.0 0.0 - 0.2 %  Neutrophils Relative % 87 %  Neutro Abs 7.0 1.7 - 7.7 K/uL  Lymphocytes Relative  9 %  Lymphs Abs 0.7 0.7 - 4.0 K/uL  Monocytes Relative 3 %  Monocytes Absolute 0.2 0.1 - 1.0 K/uL  Eosinophils Relative 0 %  Eosinophils Absolute 0.0 0.0 - 0.5 K/uL  Basophils Relative 0 %  Basophils Absolute 0.0 0.0 - 0.1 K/uL  Immature Granulocytes 1 %  Abs Immature Granulocytes 0.07 0.00 - 0.07 K/uL   Comment: Performed at [facility], [place] [address] [place], [place] [address] Basic metabolic panel     Status: Abnormal  Collection Time: 09/10/18  6:10 AM Result Value Ref Range  Sodium 141 135 - 145 mmol/L  Potassium 4.2 3.5 - 5.1 mmol/L  Chloride 112 (H) 98 - 111 mmol/L  CO2 21 (L) 22 - 32 mmol/L  Glucose, Bld 99 70 - 99 mg/dL  BUN 48 (H) 8 - 23 mg/dL  Creatinine, Ser 3.14 (H) 0.44 - 1.00  mg/dL  Calcium 8.9 8.9 - 10.3 mg/dL  GFR calc non Af Amer 13 (L) >60 mL/min  GFR calc Af Amer 15 (L) >60 mL/min  Anion gap 8 5 - 15   Comment: Performed at [facility] [address], [place], [place] [address] Culture, blood (routine x 2)     Status: None  Collection Time: 09/10/18  7:43 AM  Specimen: BLOOD LEFT HAND Result Value Ref Range  Specimen Description BLOOD LEFT HAND   Special Requests AEROBIC BOTTLE ONLY Blood Culture adequate volume   Culture     NO GROWTH 5 DAYS Performed at [facility], [place] [address] [place], [place] [address]   Report Status 09/15/2018 FINAL  Culture, blood (routine x 2)     Status: None  Collection Time: 09/10/18  7:52 AM  Specimen: BLOOD RIGHT HAND Result Value Ref Range  Specimen Description BLOOD RIGHT HAND   Special Requests     AEROBIC BOTTLE ONLY Blood Culture results may not be optimal due to an inadequate volume of blood received in culture bottles  Culture     NO GROWTH 5 DAYS Performed at [facility], [place] [address] [place], [place] [address]   Report Status 09/15/2018 FINAL  Glucose, capillary     Status: None  Collection Time: 09/10/18  8:16 AM Result Value Ref Range  Glucose-Capillary 85 70 - 99 mg/dL Glucose, capillary     Status: None  Collection Time: 09/10/18 12:53 PM Result Value Ref Range  Glucose-Capillary 94 70 - 99 mg/dL Glucose, capillary     Status: Abnormal  Collection Time: 09/10/18  4:04 PM Result Value Ref Range  Glucose-Capillary 120 (H) 70 - 99 mg/dL Glucose, capillary     Status: None  Collection Time: 09/10/18  7:53 PM Result Value Ref Range  Glucose-Capillary 78 70 - 99 mg/dL Glucose, capillary     Status: Abnormal  Collection Time: 09/11/18 12:01 AM Result Value Ref Range  Glucose-Capillary 103 (H) 70 - 99 mg/dL Glucose, capillary     Status: None  Collection Time: 09/11/18  4:15 AM Result Value Ref Range  Glucose-Capillary 88 70 - 99 mg/dL Basic metabolic panel      Status: Abnormal  Collection Time: 09/11/18  5:00 AM Result Value Ref Range  Sodium 143 135 - 145 mmol/L  Potassium 4.0 3.5 - 5.1 mmol/L  Chloride 112 (H) 98 - 111 mmol/L  CO2 22 22 - 32 mmol/L  Glucose, Bld 87 70 - 99 mg/dL  BUN 43 (H) 8 - 23 mg/dL  Creatinine, Ser 2.98 (H) 0.44 -  1.00 mg/dL  Calcium 9.1 8.9 - 10.3 mg/dL  GFR calc non Af Amer 14 (L) >60 mL/min  GFR calc Af Amer 16 (L) >60 mL/min  Anion gap 9 5 - 15   Comment: Performed at [facility] [address], [place], [place] [address] Glucose, capillary     Status: None  Collection Time: 09/11/18  7:44 AM Result Value Ref Range  Glucose-Capillary 73 70 - 99 mg/dL Glucose, capillary     Status: None  Collection Time: 09/11/18 11:01 AM Result Value Ref Range  Glucose-Capillary 94 70 - 99 mg/dL Glucose, capillary     Status: Abnormal  Collection Time: 09/11/18  4:01 PM Result Value Ref Range  Glucose-Capillary 151 (H) 70 - 99 mg/dL Glucose, capillary     Status: None  Collection Time: 09/11/18  7:56 PM Result Value Ref Range  Glucose-Capillary 88 70 - 99 mg/dL  Comment 1 Notify RN   Comment 2 Document in Chart  Glucose, capillary     Status: None  Collection Time: 09/11/18 11:40 PM Result Value Ref Range  Glucose-Capillary 78 70 - 99 mg/dL  Comment 1 Notify RN   Comment 2 Document in Chart  Glucose, capillary     Status: None  Collection Time: 09/12/18  4:12 AM Result Value Ref Range  Glucose-Capillary 75 70 - 99 mg/dL  Comment 1 Notify RN   Comment 2 Document in Chart  CBC with Differential     Status: Abnormal  Collection Time: 09/12/18  4:54 AM Result Value Ref Range  WBC 7.4 4.0 - 10.5 K/uL  RBC 2.36 (L) 3.87 - 5.11 MIL/uL  Hemoglobin 7.2 (L) 12.0 - 15.0 g/dL  HCT 22.7 (L) 36.0 - 46.0 %  MCV 96.2 80.0 - 100.0 fL  MCH 30.5 26.0 - 34.0 pg  MCHC 31.7 30.0 - 36.0 g/dL  RDW 18.1 (H) 11.5 - 15.5 %  Platelets 98 (L) 150 - 400 K/uL   Comment: REPEATED TO VERIFY Immature Platelet Fraction may  be clinically indicated, consider ordering this additional test [id number] CONSISTENT WITH PREVIOUS RESULT   nRBC 0.3 (H) 0.0 - 0.2 %  Neutrophils Relative % 81 %  Neutro Abs 5.9 1.7 - 7.7 K/uL  Lymphocytes Relative 12 %  Lymphs Abs 0.9 0.7 - 4.0 K/uL  Monocytes Relative 5 %  Monocytes Absolute 0.4 0.1 - 1.0 K/uL  Eosinophils Relative 0 %  Eosinophils Absolute 0.0 0.0 - 0.5 K/uL  Basophils Relative 0 %  Basophils Absolute 0.0 0.0 - 0.1 K/uL  Immature Granulocytes 2 %  Abs Immature Granulocytes 0.15 (H) 0.00 - 0.07 K/uL   Comment: Performed at [facility], [address] [place], [place] [address] Basic metabolic panel     Status: Abnormal  Collection Time: 09/12/18  4:54 AM Result Value Ref Range  Sodium 144 135 - 145 mmol/L  Potassium 4.0 3.5 - 5.1 mmol/L  Chloride 113 (H) 98 - 111 mmol/L  CO2 23 22 - 32 mmol/L  Glucose, Bld 75 70 - 99 mg/dL  BUN 43 (H) 8 - 23 mg/dL  Creatinine, Ser 2.89 (H) 0.44 - 1.00 mg/dL  Calcium 9.0 8.9 - 10.3 mg/dL  GFR calc non Af Amer 14 (L) >60 mL/min  GFR calc Af Amer 17 (L) >60 mL/min  Anion gap 8 5 - 15   Comment: Performed at [facility] [address], [place], [place] [address] Glucose, capillary     Status: None  Collection Time: 09/12/18  7:53  AM Result Value Ref Range  Glucose-Capillary 77 70 - 99 mg/dL Reticulocytes     Status: Abnormal  Collection Time: 09/12/18  9:38 AM Result Value Ref Range  Retic Ct Pct 1.5 0.4 - 3.1 %  RBC. 2.37 (L) 3.87 - 5.11 MIL/uL  Retic Count, Absolute 34.4 19.0 - 186.0 K/uL  Immature Retic Fract 14.4 2.3 - 15.9 %   Comment: Performed at [facility] [address] [place], [place] [address] Glucose, capillary     Status: Abnormal  Collection Time: 09/12/18 11:02 AM Result Value Ref Range  Glucose-Capillary 122 (H) 70 - 99 mg/dL Glucose, capillary     Status: Abnormal  Collection Time: 09/12/18  9:05 PM Result Value Ref Range  Glucose-Capillary 123 (H) 70 - 99 mg/dL Glucose,  capillary     Status: Abnormal  Collection Time: 09/13/18 12:00 AM Result Value Ref Range  Glucose-Capillary 109 (H) 70 - 99 mg/dL Glucose, capillary     Status: None  Collection Time: 09/13/18  5:17 AM Result Value Ref Range  Glucose-Capillary 96 70 - 99 mg/dL CBC     Status: Abnormal  Collection Time: 09/13/18  6:13 AM Result Value Ref Range  WBC 7.1 4.0 - 10.5 K/uL  RBC 2.21 (L) 3.87 - 5.11 MIL/uL  Hemoglobin 6.7 (LL) 12.0 - 15.0 g/dL   Comment: REPEATED TO VERIFY THIS CRITICAL RESULT HAS VERIFIED AND BEEN CALLED TO A. HODGES, RN BY JULIE MACEDA DEL ANGEL ON 04 28 2020 AT 0702, AND HAS BEEN READ BACK.    HCT 21.6 (L) 36.0 - 46.0 %  MCV 97.7 80.0 - 100.0 fL  MCH 30.3 26.0 - 34.0 pg  MCHC 31.0 30.0 - 36.0 g/dL  RDW 17.8 (H) 11.5 - 15.5 %  Platelets 93 (L) 150 - 400 K/uL   Comment: REPEATED TO VERIFY Immature Platelet Fraction may be clinically indicated, consider ordering this additional test [id number] CONSISTENT WITH PREVIOUS RESULT   nRBC 0.3 (H) 0.0 - 0.2 %   Comment: Performed at [facility] [address], [place], [place] [address] Comprehensive metabolic panel     Status: Abnormal  Collection Time: 09/13/18  6:13 AM Result Value Ref Range  Sodium 145 135 - 145 mmol/L  Potassium 3.5 3.5 - 5.1 mmol/L  Chloride 116 (H) 98 - 111 mmol/L  CO2 22 22 - 32 mmol/L  Glucose, Bld 90 70 - 99 mg/dL  BUN 39 (H) 8 - 23 mg/dL  Creatinine, Ser 2.40 (H) 0.44 - 1.00 mg/dL  Calcium 8.3 (L) 8.9 - 10.3 mg/dL  Total Protein 4.9 (L) 6.5 - 8.1 g/dL  Albumin 1.8 (L) 3.5 - 5.0 g/dL  AST 60 (H) 15 - 41 U/L  ALT 61 (H) 0 - 44 U/L  Alkaline Phosphatase 73 38 - 126 U/L  Total Bilirubin 0.5 0.3 - 1.2 mg/dL  GFR calc non Af Amer 18 (L) >60 mL/min  GFR calc Af Amer 21 (L) >60 mL/min  Anion gap 7 5 - 15   Comment: Performed at [facility], [place] [address] [place], [place] [address] Type and screen [place]     Status: None  Collection Time: 09/13/18  7:46  AM Result Value Ref Range  ABO/RH(D) O POS   Antibody Screen NEG   Sample Expiration 09/16/2018   Unit Number [id number]   Blood Component Type RBC LR PHER1   Unit division 00   Status of Unit ISSUED,FINAL   Transfusion Status OK TO TRANSFUSE  Crossmatch Result     Compatible Performed at [facility], [place] [address], [place], [place] [address]  ABO/Rh     Status: None  Collection Time: 09/13/18  7:46 AM Result Value Ref Range  ABO/RH(D)     O POS Performed at [facility] [address], [place], [place] [address]  Prepare RBC     Status: None  Collection Time: 09/13/18  7:46 AM Result Value Ref Range  Order Confirmation     ORDER PROCESSED BY BLOOD BANK Performed at [facility], [place] [address] [place], [place] [address]  BPAM RBC     Status: None  Collection Time: 09/13/18  7:46 AM Result Value Ref Range  ISSUE DATE / TIME [id number]   Blood Product Unit Number [id number]   PRODUCT CODE [id number]   Unit Type and Rh 5100   Blood Product Expiration Date [id number]  Glucose, capillary     Status: Abnormal  Collection Time: 09/13/18  8:38 AM Result Value Ref Range  Glucose-Capillary 111 (H) 70 - 99 mg/dL Glucose, capillary     Status: Abnormal  Collection Time: 09/13/18 11:42 AM Result Value Ref Range  Glucose-Capillary 193 (H) 70 - 99 mg/dL Hemoglobin and hematocrit, blood     Status: Abnormal  Collection Time: 09/13/18  2:42 PM Result Value Ref Range  Hemoglobin 8.1 (L) 12.0 - 15.0 g/dL  HCT 25.7 (L) 36.0 - 46.0 %   Comment: Performed at [facility], [place]. [address] [place], [place] [address] Glucose, capillary     Status: Abnormal  Collection Time: 09/13/18  4:38 PM Result Value Ref Range  Glucose-Capillary 165 (H) 70 - 99 mg/dL Glucose, capillary     Status: Abnormal  Collection Time: 09/13/18  7:56 PM Result Value Ref Range  Glucose-Capillary 126 (H) 70 - 99 mg/dL Glucose, capillary     Status: Abnormal  Collection  Time: 09/13/18 11:51 PM Result Value Ref Range  Glucose-Capillary 106 (H) 70 - 99 mg/dL Glucose, capillary     Status: None  Collection Time: 09/14/18  4:12 AM Result Value Ref Range  Glucose-Capillary 98 70 - 99 mg/dL CBC     Status: Abnormal  Collection Time: 09/14/18  6:41 AM Result Value Ref Range  WBC 6.7 4.0 - 10.5 K/uL  RBC 2.56 (L) 3.87 - 5.11 MIL/uL  Hemoglobin 7.6 (L) 12.0 - 15.0 g/dL  HCT 24.1 (L) 36.0 - 46.0 %  MCV 94.1 80.0 - 100.0 fL  MCH 29.7 26.0 - 34.0 pg  MCHC 31.5 30.0 - 36.0 g/dL  RDW 17.6 (H) 11.5 - 15.5 %  Platelets 100 (L) 150 - 400 K/uL   Comment: REPEATED TO VERIFY Immature Platelet Fraction may be clinically indicated, consider ordering this additional test [id number] CONSISTENT WITH PREVIOUS RESULT   nRBC 0.0 0.0 - 0.2 %   Comment: Performed at [facility], [place] [address], [place], [place] [address] Comprehensive metabolic panel     Status: Abnormal  Collection Time: 09/14/18  6:41 AM Result Value Ref Range  Sodium 143 135 - 145 mmol/L  Potassium 3.6 3.5 - 5.1 mmol/L  Chloride 110 98 - 111 mmol/L  CO2 23 22 - 32 mmol/L  Glucose, Bld 90 70 - 99 mg/dL  BUN 40 (H) 8 - 23 mg/dL  Creatinine, Ser 2.29 (H) 0.44 - 1.00 mg/dL  Calcium 8.8 (L) 8.9 - 10.3 mg/dL  Total Protein 5.0 (L) 6.5 - 8.1 g/dL  Albumin 1.9 (L) 3.5 -  5.0 g/dL  AST 50 (H) 15 - 41 U/L  ALT 60 (H) 0 - 44 U/L  Alkaline Phosphatase 79 38 - 126 U/L  Total Bilirubin 0.5 0.3 - 1.2 mg/dL  GFR calc non Af Amer 19 (L) >60 mL/min  GFR calc Af Amer 22 (L) >60 mL/min  Anion gap 10 5 - 15   Comment: Performed at [facility] [address], [place], [place] [address] Glucose, capillary     Status: None  Collection Time: 09/14/18  8:01 AM Result Value Ref Range  Glucose-Capillary 94 70 - 99 mg/dL Glucose, capillary     Status: Abnormal  Collection Time: 09/14/18 11:39 AM Result Value Ref Range  Glucose-Capillary 133 (H) 70 - 99 mg/dL Glucose, capillary     Status:  Abnormal  Collection Time: 09/14/18  4:23 PM Result Value Ref Range  Glucose-Capillary 108 (H) 70 - 99 mg/dL Glucose, capillary     Status: Abnormal  Collection Time: 09/14/18  7:38 PM Result Value Ref Range  Glucose-Capillary 164 (H) 70 - 99 mg/dL Glucose, capillary     Status: Abnormal  Collection Time: 09/14/18 11:30 PM Result Value Ref Range  Glucose-Capillary 136 (H) 70 - 99 mg/dL Glucose, capillary     Status: Abnormal  Collection Time: 09/15/18  4:11 AM Result Value Ref Range  Glucose-Capillary 106 (H) 70 - 99 mg/dL Comprehensive metabolic panel     Status: Abnormal  Collection Time: 09/15/18  7:31 AM Result Value Ref Range  Sodium 140 135 - 145 mmol/L  Potassium 3.6 3.5 - 5.1 mmol/L  Chloride 107 98 - 111 mmol/L  CO2 25 22 - 32 mmol/L  Glucose, Bld 103 (H) 70 - 99 mg/dL  BUN 40 (H) 8 - 23 mg/dL  Creatinine, Ser 1.96 (H) 0.44 - 1.00 mg/dL  Calcium 8.7 (L) 8.9 - 10.3 mg/dL  Total Protein 5.0 (L) 6.5 - 8.1 g/dL  Albumin 2.0 (L) 3.5 - 5.0 g/dL  AST 66 (H) 15 - 41 U/L  ALT 73 (H) 0 - 44 U/L  Alkaline Phosphatase 85 38 - 126 U/L  Total Bilirubin 0.5 0.3 - 1.2 mg/dL  GFR calc non Af Amer 23 (L) >60 mL/min  GFR calc Af Amer 27 (L) >60 mL/min  Anion gap 8 5 - 15   Comment: Performed at [facility], [address], [place], [place] [address] CBC with Differential/Platelet     Status: Abnormal  Collection Time: 09/15/18  7:31 AM Result Value Ref Range  WBC 5.8 4.0 - 10.5 K/uL  RBC 2.72 (L) 3.87 - 5.11 MIL/uL  Hemoglobin 8.2 (L) 12.0 - 15.0 g/dL  HCT 25.3 (L) 36.0 - 46.0 %  MCV 93.0 80.0 - 100.0 fL  MCH 30.1 26.0 - 34.0 pg  MCHC 32.4 30.0 - 36.0 g/dL  RDW 17.5 (H) 11.5 - 15.5 %  Platelets 114 (L) 150 - 400 K/uL   Comment: REPEATED TO VERIFY Immature Platelet Fraction may be clinically indicated, consider ordering this additional test [id number] CONSISTENT WITH PREVIOUS RESULT   nRBC 0.0 0.0 - 0.2 %  Neutrophils Relative % 70 %  Neutro Abs 4.1 1.7 - 7.7  K/uL  Lymphocytes Relative 18 %  Lymphs Abs 1.0 0.7 - 4.0 K/uL  Monocytes Relative 8 %  Monocytes Absolute 0.4 0.1 - 1.0 K/uL  Eosinophils Relative 0 %  Eosinophils Absolute 0.0 0.0 - 0.5 K/uL  Basophils Relative 0 %  Basophils Absolute 0.0 0.0 - 0.1 K/uL  Immature Granulocytes 4 %  Abs Immature Granulocytes 0.20 (H) 0.00 - 0.07 K/uL  Schistocytes PRESENT    Comment: Performed at [facility], [place] [address], [place], [place] [address] Glucose, capillary     Status: None  Collection Time: 09/15/18  8:18 AM Result Value Ref Range  Glucose-Capillary 99 70 - 99 mg/dL Glucose, capillary     Status: Abnormal  Collection Time: 09/15/18 12:20 PM Result Value Ref Range  Glucose-Capillary 173 (H) 70 - 99 mg/dL Glucose, capillary     Status: Abnormal  Collection Time: 09/15/18  4:30 PM Result Value Ref Range  Glucose-Capillary 178 (H) 70 - 99 mg/dL Glucose, capillary     Status: Abnormal  Collection Time: 09/15/18  8:49 PM Result Value Ref Range  Glucose-Capillary 164 (H) 70 - 99 mg/dL Glucose, capillary     Status: Abnormal  Collection Time: 09/16/18 12:31 AM Result Value Ref Range  Glucose-Capillary 142 (H) 70 - 99 mg/dL Comprehensive metabolic panel     Status: Abnormal  Collection Time: 09/16/18  4:12 AM Result Value Ref Range  Sodium 140 135 - 145 mmol/L  Potassium 3.8 3.5 - 5.1 mmol/L  Chloride 107 98 - 111 mmol/L  CO2 24 22 - 32 mmol/L  Glucose, Bld 116 (H) 70 - 99 mg/dL  BUN 39 (H) 8 - 23 mg/dL  Creatinine, Ser 1.70 (H) 0.44 - 1.00 mg/dL  Calcium 8.6 (L) 8.9 - 10.3 mg/dL  Total Protein 5.1 (L) 6.5 - 8.1 g/dL  Albumin 2.0 (L) 3.5 - 5.0 g/dL  AST 61 (H) 15 - 41 U/L  ALT 71 (H) 0 - 44 U/L  Alkaline Phosphatase 91 38 - 126 U/L  Total Bilirubin 0.5 0.3 - 1.2 mg/dL  GFR calc non Af Amer 27 (L) >60 mL/min  GFR calc Af Amer 32 (L) >60 mL/min  Anion gap 9 5 - 15   Comment: Performed at [facility], [place] [address], [place], [place] [address] CBC with  Differential/Platelet     Status: Abnormal  Collection Time: 09/16/18  4:12 AM Result Value Ref Range  WBC 6.7 4.0 - 10.5 K/uL  RBC 2.60 (L) 3.87 - 5.11 MIL/uL  Hemoglobin 7.9 (L) 12.0 - 15.0 g/dL  HCT 24.5 (L) 36.0 - 46.0 %  MCV 94.2 80.0 - 100.0 fL  MCH 30.4 26.0 - 34.0 pg  MCHC 32.2 30.0 - 36.0 g/dL  RDW 17.2 (H) 11.5 - 15.5 %  Platelets 135 (L) 150 - 400 K/uL  nRBC 0.0 0.0 - 0.2 %  Neutrophils Relative % 77 %  Neutro Abs 5.1 1.7 - 7.7 K/uL  Lymphocytes Relative 15 %  Lymphs Abs 1.0 0.7 - 4.0 K/uL  Monocytes Relative 6 %  Monocytes Absolute 0.4 0.1 - 1.0 K/uL  Eosinophils Relative 0 %  Eosinophils Absolute 0.0 0.0 - 0.5 K/uL  Basophils Relative 0 %  Basophils Absolute 0.0 0.0 - 0.1 K/uL  Immature Granulocytes 2 %  Abs Immature Granulocytes 0.15 (H) 0.00 - 0.07 K/uL   Comment: Performed at [facility], [place] [address] [place], [place] [address] Glucose, capillary     Status: Abnormal  Collection Time: 09/16/18  4:16 AM Result Value Ref Range  Glucose-Capillary 116 (H) 70 - 99 mg/dL Glucose, capillary     Status: Abnormal  Collection Time: 09/16/18  7:57 AM Result Value Ref Range  Glucose-Capillary 121 (H) 70 - 99 mg/dL Glucose, capillary     Status: Abnormal  Collection Time: 09/16/18 11:42 AM Result Value Ref Range  Glucose-Capillary 117 (H) 70 - 99 mg/dL Glucose, capillary     Status: Abnormal  Collection Time: 09/16/18  4:35 PM Result Value Ref Range  Glucose-Capillary 140 (H) 70 - 99 mg/dL Novel Coronavirus, NAA (hospital order; send-out to ref lab)     Status: None  Collection Time: 09/16/18  6:44 PM  Specimen: Nasopharyngeal Swab; Respiratory Result Value Ref Range  SARS-CoV-2, NAA NOT DETECTED NOT DETECTED   Comment: (NOTE) This test was developed and its performance characteristics determined by [employer]. This test has not been FDA cleared or approved. This test has been authorized by FDA under an Emergency Use Authorization (EUA). This  test is only authorized for the duration of time the declaration that circumstances exist justifying the authorization of the emergency use of in vitro diagnostic tests for detection of SARS-CoV-2 virus and/or diagnosis of COVID-19 infection under section 564(b)(1) of the Act, 21 U.S.C. 161WRU-0(A)(5), unless the authorization is terminated or revoked sooner. When diagnostic testing is negative, the possibility of a false negative result should be considered in the context of a patient's recent exposures and the presence of clinical signs and symptoms consistent with COVID-19. An individual without symptoms of COVID-19 and who is not shedding SARS-CoV-2 virus would expect to have a negative (not detected) result in this assay. Performed  At: [facility] [place], [place] [id number] Rush Farmer MD NW:[id number]   Coronavirus Source NASOPHARYNGEAL    Comment: Performed at [facility], [place] [address], [place], [place] [address] Comprehensive metabolic panel     Status: Abnormal  Collection Time: 09/17/18  2:19 AM Result Value Ref Range  Sodium 138 135 - 145 mmol/L  Potassium 3.7 3.5 - 5.1 mmol/L  Chloride 106 98 - 111 mmol/L  CO2 26 22 - 32 mmol/L  Glucose, Bld 116 (H) 70 - 99 mg/dL  BUN 34 (H) 8 - 23 mg/dL  Creatinine, Ser 1.58 (H) 0.44 - 1.00 mg/dL  Calcium 8.8 (L) 8.9 - 10.3 mg/dL  Total Protein 5.3 (L) 6.5 - 8.1 g/dL  Albumin 2.1 (L) 3.5 - 5.0 g/dL  AST 55 (H) 15 - 41 U/L  ALT 72 (H) 0 - 44 U/L  Alkaline Phosphatase 95 38 - 126 U/L  Total Bilirubin 0.5 0.3 - 1.2 mg/dL  GFR calc non Af Amer 30 (L) >60 mL/min  GFR calc Af Amer 34 (L) >60 mL/min  Anion gap 6 5 - 15   Comment: Performed at [facility], [place] [address], [place], [place] [address] CBC with Differential/Platelet     Status: Abnormal  Collection Time: 09/17/18  2:19 AM Result Value Ref Range  WBC 6.2 4.0 - 10.5 K/uL  RBC 2.83 (L) 3.87 - 5.11 MIL/uL  Hemoglobin 8.5 (L) 12.0  - 15.0 g/dL  HCT 26.3 (L) 36.0 - 46.0 %  MCV 92.9 80.0 - 100.0 fL  MCH 30.0 26.0 - 34.0 pg  MCHC 32.3 30.0 - 36.0 g/dL  RDW 17.2 (H) 11.5 - 15.5 %  Platelets 160 150 - 400 K/uL  nRBC 0.0 0.0 - 0.2 %  Neutrophils Relative % 76 %  Neutro Abs 4.7 1.7 - 7.7 K/uL  Lymphocytes Relative 16 %  Lymphs Abs 1.0 0.7 - 4.0 K/uL  Monocytes Relative 6 %  Monocytes Absolute 0.4 0.1 - 1.0 K/uL  Eosinophils Relative 0 %  Eosinophils Absolute 0.0 0.0 - 0.5 K/uL  Basophils Relative 0 %  Basophils Absolute 0.0 0.0 - 0.1 K/uL  Immature Granulocytes 2 %  Abs Immature Granulocytes 0.11 (H) 0.00 - 0.07 K/uL   Comment: Performed at [facility], [place] [address] [place], [place] [address] Glucose, capillary     Status: Abnormal  Collection Time: 09/17/18  5:59 AM Result Value Ref Range  Glucose-Capillary 103 (H) 70 - 99 mg/dL Glucose, capillary     Status: None  Collection Time: 09/17/18  7:32 AM Result Value Ref Range  Glucose-Capillary 98 70 - 99 mg/dL Glucose, capillary     Status: None  Collection Time: 09/17/18 11:35 AM Result Value Ref Range  Glucose-Capillary 87 70 - 99 mg/dL Glucose, capillary     Status: Abnormal  Collection Time: 09/17/18  4:07 PM Result Value Ref Range  Glucose-Capillary 116 (H) 70 - 99 mg/dL Glucose, capillary     Status: Abnormal  Collection Time: 09/17/18  8:01 PM Result Value Ref Range  Glucose-Capillary 187 (H) 70 - 99 mg/dL Glucose, capillary     Status: Abnormal  Collection Time: 09/18/18 12:13 AM Result Value Ref Range  Glucose-Capillary 159 (H) 70 - 99 mg/dL Glucose, capillary     Status: Abnormal  Collection Time: 09/18/18  5:20 AM Result Value Ref Range  Glucose-Capillary 109 (H) 70 - 99 mg/dL CBC with Differential/Platelet     Status: Abnormal  Collection Time: 09/18/18  5:22 AM Result Value Ref Range  WBC 6.6 4.0 - 10.5 K/uL  RBC 2.70 (L) 3.87 - 5.11 MIL/uL  Hemoglobin 8.1 (L) 12.0 - 15.0 g/dL  HCT 25.5 (L) 36.0 - 46.0 %  MCV 94.4 80.0 -  100.0 fL  MCH 30.0 26.0 - 34.0 pg  MCHC 31.8 30.0 - 36.0 g/dL  RDW 17.3 (H) 11.5 - 15.5 %  Platelets 177 150 - 400 K/uL  nRBC 0.0 0.0 - 0.2 %  Neutrophils Relative % 76 %  Neutro Abs 5.1 1.7 - 7.7 K/uL  Lymphocytes Relative 16 %  Lymphs Abs 1.0 0.7 - 4.0 K/uL  Monocytes Relative 6 %  Monocytes Absolute 0.4 0.1 - 1.0 K/uL  Eosinophils Relative 0 %  Eosinophils Absolute 0.0 0.0 - 0.5 K/uL  Basophils Relative 1 %  Basophils Absolute 0.0 0.0 - 0.1 K/uL  Immature Granulocytes 1 %  Abs Immature Granulocytes 0.09 (H) 0.00 - 0.07 K/uL   Comment: Performed at [facility], [address] [place], [place] [address] Comprehensive metabolic panel     Status: Abnormal  Collection Time: 09/18/18  5:22 AM Result Value Ref Range  Sodium 139 135 - 145 mmol/L  Potassium 4.0 3.5 - 5.1 mmol/L  Chloride 105 98 - 111 mmol/L  CO2 26 22 - 32 mmol/L  Glucose, Bld 114 (H) 70 - 99 mg/dL  BUN 32 (H) 8 - 23 mg/dL  Creatinine, Ser 1.49 (H) 0.44 - 1.00 mg/dL  Calcium 8.8 (L) 8.9 - 10.3 mg/dL  Total Protein 5.0 (L) 6.5 - 8.1 g/dL  Albumin 2.0 (L) 3.5 - 5.0 g/dL  AST 114 (H) 15 - 41 U/L  ALT 108 (H) 0 - 44 U/L  Alkaline Phosphatase 108 38 - 126 U/L  Total Bilirubin 0.4 0.3 - 1.2 mg/dL  GFR calc non Af Amer 32 (L) >60 mL/min  GFR calc Af Amer 37 (L) >60 mL/min  Anion gap 8 5 - 15   Comment: Performed at [facility], [place] [address] [place], [place] [address] Glucose, capillary     Status: Abnormal  Collection Time: 09/18/18  8:14 AM Result Value Ref Range  Glucose-Capillary 111 (H) 70 -  99 mg/dL Glucose, capillary     Status: Abnormal  Collection Time: 09/18/18 11:23 AM Result Value Ref Range  Glucose-Capillary 118 (H) 70 - 99 mg/dL Glucose, capillary     Status: Abnormal  Collection Time: 09/18/18  4:16 PM Result Value Ref Range  Glucose-Capillary 111 (H) 70 - 99 mg/dL Glucose, capillary     Status: Abnormal  Collection Time: 09/18/18  7:51 PM Result Value Ref  Range  Glucose-Capillary 174 (H) 70 - 99 mg/dL Glucose, capillary     Status: Abnormal  Collection Time: 09/19/18 12:06 AM Result Value Ref Range  Glucose-Capillary 127 (H) 70 - 99 mg/dL Glucose, capillary     Status: Abnormal  Collection Time: 09/19/18  3:49 AM Result Value Ref Range  Glucose-Capillary 109 (H) 70 - 99 mg/dL CBC with Differential/Platelet     Status: Abnormal  Collection Time: 09/19/18  5:09 AM Result Value Ref Range  WBC 5.3 4.0 - 10.5 K/uL  RBC 2.56 (L) 3.87 - 5.11 MIL/uL  Hemoglobin 7.6 (L) 12.0 - 15.0 g/dL  HCT 24.3 (L) 36.0 - 46.0 %  MCV 94.9 80.0 - 100.0 fL  MCH 29.7 26.0 - 34.0 pg  MCHC 31.3 30.0 - 36.0 g/dL  RDW 17.5 (H) 11.5 - 15.5 %  Platelets 188 150 - 400 K/uL  nRBC 0.0 0.0 - 0.2 %  Neutrophils Relative % 74 %  Neutro Abs 3.9 1.7 - 7.7 K/uL  Lymphocytes Relative 19 %  Lymphs Abs 1.0 0.7 - 4.0 K/uL  Monocytes Relative 6 %  Monocytes Absolute 0.3 0.1 - 1.0 K/uL  Eosinophils Relative 0 %  Eosinophils Absolute 0.0 0.0 - 0.5 K/uL  Basophils Relative 0 %  Basophils Absolute 0.0 0.0 - 0.1 K/uL  Immature Granulocytes 1 %  Abs Immature Granulocytes 0.06 0.00 - 0.07 K/uL   Comment: Performed at [facility], [address] [place], [place] [address] Comprehensive metabolic panel     Status: Abnormal  Collection Time: 09/19/18  5:09 AM Result Value Ref Range  Sodium 139 135 - 145 mmol/L  Potassium 3.8 3.5 - 5.1 mmol/L  Chloride 105 98 - 111 mmol/L  CO2 27 22 - 32 mmol/L  Glucose, Bld 117 (H) 70 - 99 mg/dL  BUN 29 (H) 8 - 23 mg/dL  Creatinine, Ser 1.52 (H) 0.44 - 1.00 mg/dL  Calcium 8.5 (L) 8.9 - 10.3 mg/dL  Total Protein 4.9 (L) 6.5 - 8.1 g/dL  Albumin 1.9 (L) 3.5 - 5.0 g/dL  AST 88 (H) 15 - 41 U/L  ALT 92 (H) 0 - 44 U/L  Alkaline Phosphatase 103 38 - 126 U/L  Total Bilirubin 0.5 0.3 - 1.2 mg/dL  GFR calc non Af Amer 31 (L) >60 mL/min  GFR calc Af Amer 36 (L) >60 mL/min  Anion gap 7 5 - 15   Comment: Performed at [facility], [place] [address] [place], [place] [address] Glucose, capillary     Status: Abnormal  Collection Time: 09/19/18  7:54 AM Result Value Ref Range  Glucose-Capillary 105 (H) 70 - 99 mg/dL Glucose, capillary     Status: Abnormal  Collection Time: 09/19/18 11:52 AM Result Value Ref Range  Glucose-Capillary 113 (H) 70 - 99 mg/dL CBC WITH DIFFERENTIAL     Status: Abnormal  Collection Time: 09/20/18  3:36 AM Result Value Ref Range  WBC 4.7 4.0 - 10.5 K/uL  RBC 2.49 (L) 3.87 - 5.11 MIL/uL  Hemoglobin 7.5 (L) 12.0 - 15.0 g/dL  HCT 23.7 (L)  36.0 - 46.0 %  MCV 95.2 80.0 - 100.0 fL  MCH 30.1 26.0 - 34.0 pg  MCHC 31.6 30.0 - 36.0 g/dL  RDW 17.4 (H) 11.5 - 15.5 %  Platelets 190 150 - 400 K/uL  nRBC 0.0 0.0 - 0.2 %  Neutrophils Relative % 71 %  Neutro Abs 3.3 1.7 - 7.7 K/uL  Lymphocytes Relative 19 %  Lymphs Abs 0.9 0.7 - 4.0 K/uL  Monocytes Relative 9 %  Monocytes Absolute 0.4 0.1 - 1.0 K/uL  Eosinophils Relative 0 %  Eosinophils Absolute 0.0 0.0 - 0.5 K/uL  Basophils Relative 0 %  Basophils Absolute 0.0 0.0 - 0.1 K/uL  Immature Granulocytes 1 %  Abs Immature Granulocytes 0.06 0.00 - 0.07 K/uL   Comment: Performed at [facility] [address], [place], [place] [address] Comprehensive metabolic panel     Status: Abnormal  Collection Time: 09/20/18  3:36 AM Result Value Ref Range  Sodium 138 135 - 145 mmol/L  Potassium 3.6 3.5 - 5.1 mmol/L  Chloride 105 98 - 111 mmol/L  CO2 25 22 - 32 mmol/L  Glucose, Bld 115 (H) 70 - 99 mg/dL  BUN 25 (H) 8 - 23 mg/dL  Creatinine, Ser 1.49 (H) 0.44 - 1.00 mg/dL  Calcium 8.4 (L) 8.9 - 10.3 mg/dL  Total Protein 4.6 (L) 6.5 - 8.1 g/dL  Albumin 1.8 (L) 3.5 - 5.0 g/dL  AST 66 (H) 15 - 41 U/L  ALT 78 (H) 0 - 44 U/L  Alkaline Phosphatase 92 38 - 126 U/L  Total Bilirubin 0.4 0.3 - 1.2 mg/dL  GFR calc non Af Amer 32 (L) >60 mL/min  GFR calc Af Amer 37 (L) >60 mL/min  Anion gap 8 5 - 15   Comment: Performed at [facility], [place] [address] [place], [place] [address] CBC     Status: Abnormal  Collection Time: 09/21/18  8:17 AM Result Value Ref Range  WBC 4.9 4.0 - 10.5 K/uL  RBC 2.63 (L) 3.87 - 5.11 MIL/uL  Hemoglobin 8.1 (L) 12.0 - 15.0 g/dL  HCT 24.8 (L) 36.0 - 46.0 %  MCV 94.3 80.0 - 100.0 fL  MCH 30.8 26.0 - 34.0 pg  MCHC 32.7 30.0 - 36.0 g/dL  RDW 17.5 (H) 11.5 - 15.5 %  Platelets 203 150 - 400 K/uL  nRBC 0.0 0.0 - 0.2 %   Comment: Performed at [facility], [place] [address], [place], [place] [address] Comprehensive metabolic panel     Status: Abnormal  Collection Time: 09/21/18  8:17 AM Result Value Ref Range  Sodium 141 135 - 145 mmol/L  Potassium 3.1 (L) 3.5 - 5.1 mmol/L  Chloride 102 98 - 111 mmol/L  CO2 26 22 - 32 mmol/L  Glucose, Bld 112 (H) 70 - 99 mg/dL  BUN 20 8 - 23 mg/dL  Creatinine, Ser 1.44 (H) 0.44 - 1.00 mg/dL  Calcium 8.8 (L) 8.9 - 10.3 mg/dL  Total Protein 5.1 (L) 6.5 - 8.1 g/dL  Albumin 2.0 (L) 3.5 - 5.0 g/dL  AST 50 (H) 15 - 41 U/L  ALT 72 (H) 0 - 44 U/L  Alkaline Phosphatase 104 38 - 126 U/L  Total Bilirubin 0.4 0.3 - 1.2 mg/dL  GFR calc non Af Amer 33 (L) >60 mL/min  GFR calc Af Amer 39 (L) >60 mL/min  Anion gap 13 5 - 15   Comment: Performed at [facility], [place] [address] [place], [place] [address] Lipase, blood  Status: None  Collection Time: 09/21/18  8:17 AM Result Value Ref Range  Lipase 23 11 - 51 U/L   Comment: Performed at [facility], [place] [address] [place], [place] [address] Amylase     Status: None  Collection Time: 09/21/18  8:17 AM Result Value Ref Range  Amylase 31 28 - 100 U/L   Comment: Performed at [facility] [address], [place], [place] [address] Procalcitonin - Baseline     Status: None  Collection Time: 09/21/18  4:29 PM Result Value Ref Range  Procalcitonin <0.10 ng/mL   Comment:        Interpretation: PCT (Procalcitonin) <= 0.5 ng/mL: Systemic infection (sepsis) is not likely. Local bacterial infection is  possible. (NOTE)       Sepsis PCT Algorithm           Lower Respiratory Tract                                      Infection PCT Algorithm    ----------------------------     ----------------------------         PCT < 0.25 ng/mL                PCT < 0.10 ng/mL         Strongly encourage             Strongly discourage   discontinuation of antibiotics    initiation of antibiotics    ----------------------------     -----------------------------       PCT 0.25 - 0.50 ng/mL            PCT 0.10 - 0.25 ng/mL               OR       >80% decrease in PCT            Discourage initiation of                                            antibiotics      Encourage discontinuation           of antibiotics    ----------------------------     -----------------------------         PCT >= 0.50 ng/mL              PCT 0.26 - 0.50 ng/mL               AND        <80% decrease in PCT             Encourage initiation of                                             antibiotics       Encourage continuation           of antibiotics    ----------------------------     -----------------------------        PCT >= 0.50 ng/mL                  PCT > 0.50 ng/mL  AND         increase in PCT                  Strongly encourage                                      initiation of antibiotics    Strongly encourage escalation           of antibiotics                                     -----------------------------                                           PCT <= 0.25 ng/mL                                                 OR                                        > 80% decrease in PCT                                     Discontinue / Do not initiate                                             antibiotics Performed at [facility], [address], [place], [place] [address]  Procalcitonin     Status: None  Collection Time: 09/22/18  5:16 AM Result Value Ref  Range  Procalcitonin <0.10 ng/mL   Comment:        Interpretation: PCT (Procalcitonin) <= 0.5 ng/mL: Systemic infection (sepsis) is not likely. Local bacterial infection is possible. (NOTE)       Sepsis PCT Algorithm           Lower Respiratory Tract                                      Infection PCT Algorithm    ----------------------------     ----------------------------         PCT < 0.25 ng/mL                PCT < 0.10 ng/mL         Strongly encourage             Strongly discourage   discontinuation of antibiotics    initiation of antibiotics    ----------------------------     -----------------------------       PCT 0.25 - 0.50 ng/mL            PCT 0.10 - 0.25 ng/mL  OR       >80% decrease in PCT            Discourage initiation of                                            antibiotics      Encourage discontinuation           of antibiotics    ----------------------------     -----------------------------         PCT >= 0.50 ng/mL              PCT 0.26 - 0.50 ng/mL               AND        <80% decrease in PCT             Encourage initiation of                                             antibiotics       Encourage continuation           of antibiotics    ----------------------------     -----------------------------        PCT >= 0.50 ng/mL                  PCT > 0.50 ng/mL               AND         increase in PCT                  Strongly encourage                                      initiation of antibiotics    Strongly encourage escalation           of antibiotics                                     -----------------------------                                           PCT <= 0.25 ng/mL                                                 OR                                        > 80% decrease in PCT                                     Discontinue / Do not initiate                                               antibiotics Performed at [facility], [place] [address] [place], [place] [address]  Basic metabolic panel     Status: Abnormal  Collection Time: 09/22/18  5:16 AM Result Value Ref Range  Sodium 141 135 - 145 mmol/L  Potassium 3.2 (L) 3.5 - 5.1 mmol/L  Chloride 104 98 - 111 mmol/L  CO2 25 22 - 32 mmol/L  Glucose, Bld 78 70 - 99 mg/dL  BUN 17 8 - 23 mg/dL  Creatinine, Ser 1.51 (H) 0.44 - 1.00 mg/dL  Calcium 8.6 (L) 8.9 - 10.3 mg/dL  GFR calc non Af Amer 31 (L) >60 mL/min  GFR calc Af Amer 36 (L) >60 mL/min  Anion gap 12 5 - 15   Comment: Performed at [facility] [address] [place], [place] [address] Ammonia     Status: None  Collection Time: 09/22/18 12:02 PM Result Value Ref Range  Ammonia 13 9 - 35 umol/L   Comment: Performed at [facility], [place] [address] [place], [place] [address] CBC     Status: Abnormal  Collection Time: 09/22/18 12:02 PM Result Value Ref Range  WBC 5.1 4.0 - 10.5 K/uL  RBC 2.63 (L) 3.87 - 5.11 MIL/uL  Hemoglobin 7.9 (L) 12.0 - 15.0 g/dL  HCT 24.7 (L) 36.0 - 46.0 %  MCV 93.9 80.0 - 100.0 fL  MCH 30.0 26.0 - 34.0 pg  MCHC 32.0 30.0 - 36.0 g/dL  RDW 17.9 (H) 11.5 - 15.5 %  Platelets 196 150 - 400 K/uL  nRBC 0.0 0.0 - 0.2 %   Comment: Performed at [facility], [place] [address] [place], [place] [address] Procalcitonin     Status: None  Collection Time: 09/23/18  4:34 AM Result Value Ref Range  Procalcitonin <0.10 ng/mL   Comment:        Interpretation: PCT (Procalcitonin) <= 0.5 ng/mL: Systemic infection (sepsis) is not likely. Local bacterial infection is possible. (NOTE)       Sepsis PCT Algorithm           Lower Respiratory Tract                                      Infection PCT Algorithm    ----------------------------     ----------------------------         PCT < 0.25 ng/mL                PCT < 0.10 ng/mL         Strongly encourage             Strongly discourage   discontinuation of antibiotics    initiation of antibiotics     ----------------------------     -----------------------------       PCT 0.25 - 0.50 ng/mL            PCT 0.10 - 0.25 ng/mL               OR       >80% decrease in PCT            Discourage initiation of                                            antibiotics      Encourage discontinuation  of antibiotics    ----------------------------     -----------------------------         PCT >= 0.50 ng/mL              PCT 0.26 - 0.50 ng/mL               AND        <80% decrease in PCT             Encourage initiation of                                             antibiotics       Encourage continuation           of antibiotics    ----------------------------     -----------------------------        PCT >= 0.50 ng/mL                  PCT > 0.50 ng/mL               AND         increase in PCT                  Strongly encourage                                      initiation of antibiotics    Strongly encourage escalation           of antibiotics                                     -----------------------------                                           PCT <= 0.25 ng/mL                                                 OR                                        > 80% decrease in PCT                                     Discontinue / Do not initiate                                             antibiotics Performed at [facility], [address] [place], [place] [address]  Basic metabolic panel     Status: Abnormal  Collection Time: 09/23/18  4:34 AM Result Value Ref Range  Sodium 143 135 - 145 mmol/L  Potassium 3.3 (L) 3.5 -  5.1 mmol/L  Chloride 103 98 - 111 mmol/L  CO2 24 22 - 32 mmol/L  Glucose, Bld 62 (L) 70 - 99 mg/dL  BUN 15 8 - 23 mg/dL  Creatinine, Ser 1.40 (H) 0.44 - 1.00 mg/dL  Calcium 8.9 8.9 - 10.3 mg/dL  GFR calc non Af Amer 34 (L) >60 mL/min  GFR calc Af Amer 40 (L) >60 mL/min  Anion gap 16 (H) 5 - 15   Comment: Performed at [facility]  [address], [place], [place] [address] CK     Status: Abnormal  Collection Time: 09/24/18  3:55 AM Result Value Ref Range  Total CK 37 (L) 38 - 234 U/L   Comment: Performed at [facility], [place] [address] [place], [place] [address] Troponin I - Tomorrow AM 0500     Status: None  Collection Time: 09/24/18  3:55 AM Result Value Ref Range  Troponin I <0.03 <0.03 ng/mL   Comment: Performed at [facility] [address], [place], [place] [address] Phosphorus     Status: None  Collection Time: 09/24/18  3:55 AM Result Value Ref Range  Phosphorus 2.6 2.5 - 4.6 mg/dL   Comment: Performed at [facility] [address] [place], [place] [address] Magnesium     Status: Abnormal  Collection Time: 09/24/18  3:55 AM Result Value Ref Range  Magnesium 1.3 (L) 1.7 - 2.4 mg/dL   Comment: Performed at [facility] [address], [place], [place] [address] Comprehensive metabolic panel     Status: Abnormal  Collection Time: 09/24/18  3:55 AM Result Value Ref Range  Sodium 143 135 - 145 mmol/L  Potassium 4.0 3.5 - 5.1 mmol/L  Chloride 107 98 - 111 mmol/L  CO2 23 22 - 32 mmol/L  Glucose, Bld 130 (H) 70 - 99 mg/dL  BUN 19 8 - 23 mg/dL  Creatinine, Ser 1.51 (H) 0.44 - 1.00 mg/dL  Calcium 8.7 (L) 8.9 - 10.3 mg/dL  Total Protein 6.0 (L) 6.5 - 8.1 g/dL  Albumin 2.5 (L) 3.5 - 5.0 g/dL  AST 53 (H) 15 - 41 U/L  ALT 71 (H) 0 - 44 U/L  Alkaline Phosphatase 121 38 - 126 U/L  Total Bilirubin 0.8 0.3 - 1.2 mg/dL  GFR calc non Af Amer 31 (L) >60 mL/min  GFR calc Af Amer 36 (L) >60 mL/min  Anion gap 13 5 - 15   Comment: Performed at [facility], [place] [address], [place], [place] [address] Cortisol     Status: None  Collection Time: 09/24/18  3:55 AM Result Value Ref Range  Cortisol, Plasma 25.3 ug/dL   Comment: (NOTE) AM    6.7 - 22.6 ug/dL PM   <10.0       ug/dL Performed at [facility] [address], [place], [place] [address]  Basic metabolic panel     Status:  Abnormal  Collection Time: 09/25/18  3:56 AM Result Value Ref Range  Sodium 141 135 - 145 mmol/L  Potassium 3.8 3.5 - 5.1 mmol/L  Chloride 109 98 - 111 mmol/L  CO2 23 22 - 32 mmol/L  Glucose, Bld 109 (H) 70 - 99 mg/dL  BUN 22 8 - 23 mg/dL  Creatinine, Ser 1.48 (H) 0.44 - 1.00 mg/dL  Calcium 8.1 (L) 8.9 - 10.3 mg/dL  GFR calc non Af Amer 32 (L) >60 mL/min  GFR calc Af Amer 37 (L) >60 mL/min  Anion gap 9  5 - 15   Comment: Performed at [facility], [place] [address], [place], [place] [address] Urine Culture     Status: Abnormal  Collection Time: 09/25/18 11:26 AM  Specimen: Urine, Catheterized Result Value Ref Range  Specimen Description URINE, CATHETERIZED   Special Requests     NONE Performed at [facility] [address] [place], [place] [address]   Culture 40,000 COLONIES/mL YEAST (A)   Report Status 09/26/2018 FINAL  CBC     Status: Abnormal  Collection Time: 09/26/18  4:20 AM Result Value Ref Range  WBC 4.7 4.0 - 10.5 K/uL  RBC 2.65 (L) 3.87 - 5.11 MIL/uL  Hemoglobin 8.0 (L) 12.0 - 15.0 g/dL  HCT 25.7 (L) 36.0 - 46.0 %  MCV 97.0 80.0 - 100.0 fL  MCH 30.2 26.0 - 34.0 pg  MCHC 31.1 30.0 - 36.0 g/dL  RDW 18.8 (H) 11.5 - 15.5 %  Platelets 171 150 - 400 K/uL  nRBC 0.0 0.0 - 0.2 %   Comment: Performed at [facility], [place] [address] [place], [place] [address] Basic metabolic panel     Status: Abnormal  Collection Time: 09/26/18  4:20 AM Result Value Ref Range  Sodium 140 135 - 145 mmol/L  Potassium 4.5 3.5 - 5.1 mmol/L  Chloride 108 98 - 111 mmol/L  CO2 24 22 - 32 mmol/L  Glucose, Bld 103 (H) 70 - 99 mg/dL  BUN 30 (H) 8 - 23 mg/dL  Creatinine, Ser 1.46 (H) 0.44 - 1.00 mg/dL  Calcium 8.2 (L) 8.9 - 10.3 mg/dL  GFR calc non Af Amer 33 (L) >60 mL/min  GFR calc Af Amer 38 (L) >60 mL/min  Anion gap 8 5 - 15   Comment: Performed at [facility] [address] [place], [place] [address] TSH     Status: None  Collection Time: 09/26/18  4:20  AM Result Value Ref Range  TSH 4.341 0.350 - 4.500 uIU/mL   Comment: Performed by a 3rd Generation assay with a functional sensitivity of <=0.01 uIU/mL. Performed at [facility], [place] [address], [place], [place] [address]  T4, free     Status: None  Collection Time: 09/26/18  4:20 AM Result Value Ref Range  Free T4 1.33 0.82 - 1.77 ng/dL   Comment: (NOTE) Biotin ingestion may interfere with free T4 tests. If the results are inconsistent with the TSH level, previous test results, or the clinical presentation, then consider biotin interference. If needed, order repeat testing after stopping biotin. Performed at [facility], [place] [address], [place], [place] [address]  T4     Status: None  Collection Time: 09/26/18  4:20 AM Result Value Ref Range  T4, Total 11.8 4.5 - 12.0 ug/dL   Comment: (NOTE) Performed At: [facility] [place], [place] [id number] Rush Farmer MD [id number]  Basic metabolic panel     Status: Abnormal  Collection Time: 09/26/18 10:40 AM Result Value Ref Range  Sodium 139 135 - 145 mmol/L  Potassium 4.6 3.5 - 5.1 mmol/L  Chloride 104 98 - 111 mmol/L  CO2 24 22 - 32 mmol/L  Glucose, Bld 120 (H) 70 - 99 mg/dL  BUN 27 (H) 8 - 23 mg/dL  Creatinine, Ser 1.53 (H) 0.44 - 1.00 mg/dL  Calcium 8.7 (L) 8.9 - 10.3 mg/dL  GFR calc non Af Amer 31 (L) >60 mL/min  GFR calc Af Amer 36 (L) >60 mL/min  Anion gap  11 5 - 15   Comment: Performed at [facility], [place] [address], [place], [place] [address] CBC with Differential/Platelet     Status: Abnormal  Collection Time: 09/26/18 10:40 AM Result Value Ref Range  WBC 5.0 4.0 - 10.5 K/uL  RBC 3.00 (L) 3.87 - 5.11 MIL/uL  Hemoglobin 9.0 (L) 12.0 - 15.0 g/dL  HCT 28.7 (L) 36.0 - 46.0 %  MCV 95.7 80.0 - 100.0 fL  MCH 30.0 26.0 - 34.0 pg  MCHC 31.4 30.0 - 36.0 g/dL  RDW 18.6 (H) 11.5 - 15.5 %  Platelets 185 150 - 400 K/uL  nRBC 0.0 0.0 - 0.2 %  Neutrophils Relative  % 62 %  Neutro Abs 3.1 1.7 - 7.7 K/uL  Lymphocytes Relative 23 %  Lymphs Abs 1.2 0.7 - 4.0 K/uL  Monocytes Relative 10 %  Monocytes Absolute 0.5 0.1 - 1.0 K/uL  Eosinophils Relative 0 %  Eosinophils Absolute 0.0 0.0 - 0.5 K/uL  Basophils Relative 1 %  Basophils Absolute 0.0 0.0 - 0.1 K/uL  Immature Granulocytes 4 %  Abs Immature Granulocytes 0.19 (H) 0.00 - 0.07 K/uL   Comment: Performed at [facility] [address] [place], [place] [address] Basic metabolic panel     Status: Abnormal  Collection Time: 09/27/18  4:16 AM Result Value Ref Range  Sodium 138 135 - 145 mmol/L  Potassium 4.7 3.5 - 5.1 mmol/L  Chloride 103 98 - 111 mmol/L  CO2 23 22 - 32 mmol/L  Glucose, Bld 112 (H) 70 - 99 mg/dL  BUN 30 (H) 8 - 23 mg/dL  Creatinine, Ser 1.47 (H) 0.44 - 1.00 mg/dL  Calcium 8.9 8.9 - 10.3 mg/dL  GFR calc non Af Amer 32 (L) >60 mL/min  GFR calc Af Amer 38 (L) >60 mL/min  Anion gap 12 5 - 15   Comment: Performed at [facility] [address] [place], [place] [address] Basic metabolic panel     Status: Abnormal  Collection Time: 09/28/18  3:50 AM Result Value Ref Range  Sodium 140 135 - 145 mmol/L  Potassium 5.1 3.5 - 5.1 mmol/L  Chloride 104 98 - 111 mmol/L  CO2 26 22 - 32 mmol/L  Glucose, Bld 113 (H) 70 - 99 mg/dL  BUN 33 (H) 8 - 23 mg/dL  Creatinine, Ser 1.43 (H) 0.44 - 1.00 mg/dL  Calcium 9.1 8.9 - 10.3 mg/dL  GFR calc non Af Amer 34 (L) >60 mL/min  GFR calc Af Amer 39 (L) >60 mL/min  Anion gap 10 5 - 15   Comment: Performed at [facility] [address], [place], [place] [address] Glucose, capillary     Status: None  Collection Time: 09/28/18  6:55 AM Result Value Ref Range  Glucose-Capillary 95 70 - 99 mg/dL Brain natriuretic peptide     Status: Abnormal  Collection Time: 09/28/18 11:17 AM Result Value Ref Range  B Natriuretic Peptide 241.8 (H) 0.0 - 100.0 pg/mL   Comment: Performed at [facility] [address], [place], [place]  [address] Basic metabolic panel     Status: Abnormal  Collection Time: 09/29/18  4:33 AM Result Value Ref Range  Sodium 140 135 - 145 mmol/L  Potassium 4.9 3.5 - 5.1 mmol/L  Chloride 104 98 - 111 mmol/L  CO2 22 22 - 32 mmol/L  Glucose, Bld 90 70 - 99 mg/dL  BUN 31 (H) 8 - 23 mg/dL  Creatinine, Ser 1.56 (H) 0.44 -  1.00 mg/dL  Calcium 9.5 8.9 - 10.3 mg/dL  GFR calc non Af Amer 30 (L) >60 mL/min  GFR calc Af Amer 35 (L) >60 mL/min  Anion gap 14 5 - 15   Comment: Performed at [facility] [address] [place], [place] [address] ECHOCARDIOGRAM COMPLETE     Status: None  Collection Time: 09/29/18  2:45 PM Result Value Ref Range  Weight 3,030 oz  Height 66 in  BP 174/82 mmHg Comprehensive metabolic panel     Status: Abnormal  Collection Time: 09/30/18  3:36 AM Result Value Ref Range  Sodium 139 135 - 145 mmol/L  Potassium 4.3 3.5 - 5.1 mmol/L  Chloride 104 98 - 111 mmol/L  CO2 25 22 - 32 mmol/L  Glucose, Bld 106 (H) 70 - 99 mg/dL  BUN 29 (H) 8 - 23 mg/dL  Creatinine, Ser 1.58 (H) 0.44 - 1.00 mg/dL  Calcium 9.1 8.9 - 10.3 mg/dL  Total Protein 5.5 (L) 6.5 - 8.1 g/dL  Albumin 2.6 (L) 3.5 - 5.0 g/dL  AST 46 (H) 15 - 41 U/L  ALT 53 (H) 0 - 44 U/L  Alkaline Phosphatase 142 (H) 38 - 126 U/L  Total Bilirubin 0.5 0.3 - 1.2 mg/dL  GFR calc non Af Amer 30 (L) >60 mL/min  GFR calc Af Amer 34 (L) >60 mL/min  Anion gap 10 5 - 15   Comment: Performed at [facility], [place] [address], [place], [place] [address] Basic metabolic panel     Status: Abnormal  Collection Time: 10/01/18  4:14 AM Result Value Ref Range  Sodium 140 135 - 145 mmol/L  Potassium 4.1 3.5 - 5.1 mmol/L  Chloride 107 98 - 111 mmol/L  CO2 25 22 - 32 mmol/L  Glucose, Bld 92 70 - 99 mg/dL  BUN 30 (H) 8 - 23 mg/dL  Creatinine, Ser 1.37 (H) 0.44 - 1.00 mg/dL  Calcium 8.1 (L) 8.9 - 10.3 mg/dL  GFR calc non Af Amer 35 (L) >60 mL/min  GFR calc Af Amer 41 (L) >60 mL/min  Anion gap 8 5 - 15   Comment: Performed at  [facility] [address] [place], [place] [address] CBC     Status: Abnormal  Collection Time: 10/03/18  3:35 AM Result Value Ref Range  WBC 5.5 4.0 - 10.5 K/uL  RBC 2.45 (L) 3.87 - 5.11 MIL/uL  Hemoglobin 7.4 (L) 12.0 - 15.0 g/dL  HCT 24.3 (L) 36.0 - 46.0 %  MCV 99.2 80.0 - 100.0 fL  MCH 30.2 26.0 - 34.0 pg  MCHC 30.5 30.0 - 36.0 g/dL  RDW 18.6 (H) 11.5 - 15.5 %  Platelets 161 150 - 400 K/uL  nRBC 0.0 0.0 - 0.2 %   Comment: Performed at [facility], [place] [address] [place], [place] [address] Basic metabolic panel     Status: Abnormal  Collection Time: 10/03/18 10:47 AM Result Value Ref Range  Sodium 141 135 - 145 mmol/L  Potassium 4.8 3.5 - 5.1 mmol/L  Chloride 97 (L) 98 - 111 mmol/L  CO2 32 22 - 32 mmol/L  Glucose, Bld 152 (H) 70 - 99 mg/dL  BUN 40 (H) 8 - 23 mg/dL  Creatinine, Ser 2.00 (H) 0.44 - 1.00 mg/dL  Calcium 9.4 8.9 - 10.3 mg/dL  GFR calc non Af Amer 22 (L) >60 mL/min  GFR calc Af Amer 26 (L) >60 mL/min  Anion gap 12 5 - 15   Comment: Performed at [facility], [place] [address] [place], [place] [address] Uric acid     Status: None  Collection Time: 10/04/18  4:08 AM Result Value Ref Range  Uric Acid, Serum 5.0 2.5 - 7.1 mg/dL   Comment: Performed at [facility] [address] [place], [place] [address] Basic metabolic panel     Status: Abnormal  Collection Time: 10/04/18  4:08 AM Result Value Ref Range  Sodium 140 135 - 145 mmol/L  Potassium 4.7 3.5 - 5.1 mmol/L  Chloride 96 (L) 98 - 111 mmol/L  CO2 33 (H) 22 - 32 mmol/L  Glucose, Bld 105 (H) 70 - 99 mg/dL  BUN 39 (H) 8 - 23 mg/dL  Creatinine, Ser 1.81 (H) 0.44 - 1.00 mg/dL  Calcium 9.3 8.9 - 10.3 mg/dL  GFR calc non Af Amer 25 (L) >60 mL/min  GFR calc Af Amer 29 (L) >60 mL/min  Anion gap 11 5 - 15   Comment: Performed at [facility] [address], [place], [place] [address] Basic metabolic panel     Status: Abnormal  Collection Time: 10/06/18  6:01  AM Result Value Ref Range  Sodium 139 135 - 145 mmol/L  Potassium 5.0 3.5 - 5.1 mmol/L  Chloride 98 98 - 111 mmol/L  CO2 32 22 - 32 mmol/L  Glucose, Bld 97 70 - 99 mg/dL  BUN 57 (H) 8 - 23 mg/dL  Creatinine, Ser 2.42 (H) 0.44 - 1.00 mg/dL  Calcium 9.0 8.9 - 10.3 mg/dL  GFR calc non Af Amer 18 (L) >60 mL/min  GFR calc Af Amer 21 (L) >60 mL/min  Anion gap 9 5 - 15   Comment: Performed at [facility], [place] [address], [place], [place] [address] Basic metabolic panel     Status: Abnormal  Collection Time: 10/07/18  8:38 AM Result Value Ref Range  Sodium 137 135 - 145 mmol/L  Potassium 4.6 3.5 - 5.1 mmol/L  Chloride 97 (L) 98 - 111 mmol/L  CO2 30 22 - 32 mmol/L  Glucose, Bld 106 (H) 70 - 99 mg/dL  BUN 69 (H) 8 - 23 mg/dL  Creatinine, Ser 2.60 (H) 0.44 - 1.00 mg/dL  Calcium 8.7 (L) 8.9 - 10.3 mg/dL  GFR calc non Af Amer 16 (L) >60 mL/min  GFR calc Af Amer 19 (L) >60 mL/min  Anion gap 10 5 - 15   Comment: Performed at [facility] [address], [place], [place] [address]  *Note: Due to a large number of results and/or encounters for the requested time period, some results have not been displayed. A complete set of results can be found in Results Review.   Objective: General: Patient is awake, alert, and oriented x 3 and in no acute distress.  Integument: Skin is warm, dry and supple bilateral. Nails are elongated thickened and dystrophic with subungual debris, consistent with onychomycosis, 1-5 on right 1,2,4,5 on left. No signs of infection.  Healed ulceration sub met 1 bilateral. No surrounding signs of infection, remaining integument unremarkable.  Vasculature:  Dorsalis Pedis pulse 1/4 bilateral. Posterior Tibial pulse  1/4 bilateral. Capillary fill time <3 sec 1-5 bilateral. Scant hair growth to the level of the digits.Temperature gradient within normal limits. Mild varicosities present bilateral. No edema present bilateral.   Neurology: The patient has absent  sensation measured with a 5.07/10g Semmes Weinstein Monofilament at all pedal sites bilateral . Vibratory sensation absent bilateral with tuning fork. No Babinski sign present bilateral.  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 Asymptomatic bunion and hammertoe pedal deformities noted bilateral. Muscular strength 5/5 in all lower extremity muscular groups bilateral without pain on range of motion. No tenderness with calf compression bilateral.  Assessment and Plan: Problem List Items Addressed This Visit   None  Visit Diagnoses   Pain due to onychomycosis of toenails of both feet    -  Primary  Type 2 diabetes, controlled, with neuropathy ([place])      Foot ulcer, left, limited to breakdown of skin ([place])      Healed  Foot ulcer, limited to breakdown of skin, right ([place])      healed   -Examined patient. -Discussed and educated patient on diabetic foot care, especially with  regards to the vascular, neurological and musculoskeletal systems.  -Mechanically debrided all nails x9 using sterile nail nipper without incident and smooth with rotary bur - Ulcerations healed -Home nurse may continue with medihoney on left for 1 more week  -Return to office in 10-12 weeks for nail care  -Patient advised to call the office if any problems or questions arise in the meantime.  Landis Martins, DPM

## 2018-10-28 DIAGNOSIS — N183 Chronic kidney disease, stage 3 (moderate): Secondary | ICD-10-CM | POA: Diagnosis not present

## 2018-10-28 DIAGNOSIS — L97528 Non-pressure chronic ulcer of other part of left foot with other specified severity: Secondary | ICD-10-CM | POA: Diagnosis not present

## 2018-10-28 DIAGNOSIS — I130 Hypertensive heart and chronic kidney disease with heart failure and stage 1 through stage 4 chronic kidney disease, or unspecified chronic kidney disease: Secondary | ICD-10-CM | POA: Diagnosis not present

## 2018-10-28 DIAGNOSIS — L97518 Non-pressure chronic ulcer of other part of right foot with other specified severity: Secondary | ICD-10-CM | POA: Diagnosis not present

## 2018-10-28 DIAGNOSIS — E11621 Type 2 diabetes mellitus with foot ulcer: Secondary | ICD-10-CM | POA: Diagnosis not present

## 2018-10-28 DIAGNOSIS — N390 Urinary tract infection, site not specified: Secondary | ICD-10-CM | POA: Diagnosis not present

## 2018-10-28 DIAGNOSIS — I5041 Acute combined systolic (congestive) and diastolic (congestive) heart failure: Secondary | ICD-10-CM | POA: Diagnosis not present

## 2018-10-28 DIAGNOSIS — E1122 Type 2 diabetes mellitus with diabetic chronic kidney disease: Secondary | ICD-10-CM | POA: Diagnosis not present

## 2018-10-28 DIAGNOSIS — B952 Enterococcus as the cause of diseases classified elsewhere: Secondary | ICD-10-CM | POA: Diagnosis not present

## 2018-10-31 DIAGNOSIS — E11621 Type 2 diabetes mellitus with foot ulcer: Secondary | ICD-10-CM | POA: Diagnosis not present

## 2018-10-31 DIAGNOSIS — B952 Enterococcus as the cause of diseases classified elsewhere: Secondary | ICD-10-CM | POA: Diagnosis not present

## 2018-10-31 DIAGNOSIS — E1122 Type 2 diabetes mellitus with diabetic chronic kidney disease: Secondary | ICD-10-CM | POA: Diagnosis not present

## 2018-10-31 DIAGNOSIS — N183 Chronic kidney disease, stage 3 (moderate): Secondary | ICD-10-CM | POA: Diagnosis not present

## 2018-10-31 DIAGNOSIS — N390 Urinary tract infection, site not specified: Secondary | ICD-10-CM | POA: Diagnosis not present

## 2018-10-31 DIAGNOSIS — I5041 Acute combined systolic (congestive) and diastolic (congestive) heart failure: Secondary | ICD-10-CM | POA: Diagnosis not present

## 2018-10-31 DIAGNOSIS — D631 Anemia in chronic kidney disease: Secondary | ICD-10-CM | POA: Diagnosis not present

## 2018-10-31 DIAGNOSIS — L97518 Non-pressure chronic ulcer of other part of right foot with other specified severity: Secondary | ICD-10-CM | POA: Diagnosis not present

## 2018-10-31 DIAGNOSIS — I130 Hypertensive heart and chronic kidney disease with heart failure and stage 1 through stage 4 chronic kidney disease, or unspecified chronic kidney disease: Secondary | ICD-10-CM | POA: Diagnosis not present

## 2018-10-31 DIAGNOSIS — N189 Chronic kidney disease, unspecified: Secondary | ICD-10-CM | POA: Diagnosis not present

## 2018-10-31 DIAGNOSIS — L97528 Non-pressure chronic ulcer of other part of left foot with other specified severity: Secondary | ICD-10-CM | POA: Diagnosis not present

## 2018-11-01 DIAGNOSIS — D631 Anemia in chronic kidney disease: Secondary | ICD-10-CM | POA: Diagnosis not present

## 2018-11-01 DIAGNOSIS — N184 Chronic kidney disease, stage 4 (severe): Secondary | ICD-10-CM | POA: Diagnosis not present

## 2018-11-02 ENCOUNTER — Encounter (HOSPITAL_COMMUNITY): Payer: Self-pay

## 2018-11-02 ENCOUNTER — Emergency Department (HOSPITAL_COMMUNITY)
Admission: EM | Admit: 2018-11-02 | Discharge: 2018-11-02 | Disposition: A | Discharge status: Home / Self Care | Payer: Medicare PPO | Attending: Emergency Medicine | Admitting: Emergency Medicine

## 2018-11-02 ENCOUNTER — Other Ambulatory Visit: Payer: Self-pay

## 2018-11-02 DIAGNOSIS — Z79899 Other long term (current) drug therapy: Secondary | ICD-10-CM | POA: Diagnosis not present

## 2018-11-02 DIAGNOSIS — B952 Enterococcus as the cause of diseases classified elsewhere: Secondary | ICD-10-CM | POA: Diagnosis not present

## 2018-11-02 DIAGNOSIS — D649 Anemia, unspecified: Secondary | ICD-10-CM

## 2018-11-02 DIAGNOSIS — D630 Anemia in neoplastic disease: Secondary | ICD-10-CM | POA: Diagnosis not present

## 2018-11-02 DIAGNOSIS — E039 Hypothyroidism, unspecified: Secondary | ICD-10-CM | POA: Insufficient documentation

## 2018-11-02 DIAGNOSIS — E11621 Type 2 diabetes mellitus with foot ulcer: Secondary | ICD-10-CM | POA: Diagnosis not present

## 2018-11-02 DIAGNOSIS — L97518 Non-pressure chronic ulcer of other part of right foot with other specified severity: Secondary | ICD-10-CM | POA: Diagnosis not present

## 2018-11-02 DIAGNOSIS — R42 Dizziness and giddiness: Secondary | ICD-10-CM | POA: Diagnosis not present

## 2018-11-02 DIAGNOSIS — N189 Chronic kidney disease, unspecified: Secondary | ICD-10-CM | POA: Diagnosis not present

## 2018-11-02 DIAGNOSIS — E1122 Type 2 diabetes mellitus with diabetic chronic kidney disease: Secondary | ICD-10-CM | POA: Diagnosis not present

## 2018-11-02 DIAGNOSIS — L97528 Non-pressure chronic ulcer of other part of left foot with other specified severity: Secondary | ICD-10-CM | POA: Diagnosis not present

## 2018-11-02 DIAGNOSIS — N183 Chronic kidney disease, stage 3 (moderate): Secondary | ICD-10-CM | POA: Diagnosis not present

## 2018-11-02 DIAGNOSIS — I129 Hypertensive chronic kidney disease with stage 1 through stage 4 chronic kidney disease, or unspecified chronic kidney disease: Secondary | ICD-10-CM | POA: Diagnosis not present

## 2018-11-02 DIAGNOSIS — Z7901 Long term (current) use of anticoagulants: Secondary | ICD-10-CM | POA: Diagnosis not present

## 2018-11-02 DIAGNOSIS — N390 Urinary tract infection, site not specified: Secondary | ICD-10-CM | POA: Diagnosis not present

## 2018-11-02 DIAGNOSIS — R55 Syncope and collapse: Secondary | ICD-10-CM | POA: Insufficient documentation

## 2018-11-02 DIAGNOSIS — I5041 Acute combined systolic (congestive) and diastolic (congestive) heart failure: Secondary | ICD-10-CM | POA: Diagnosis not present

## 2018-11-02 DIAGNOSIS — R0602 Shortness of breath: Secondary | ICD-10-CM | POA: Diagnosis not present

## 2018-11-02 DIAGNOSIS — I130 Hypertensive heart and chronic kidney disease with heart failure and stage 1 through stage 4 chronic kidney disease, or unspecified chronic kidney disease: Secondary | ICD-10-CM | POA: Diagnosis not present

## 2018-11-02 DIAGNOSIS — R4781 Slurred speech: Secondary | ICD-10-CM | POA: Diagnosis not present

## 2018-11-02 DIAGNOSIS — R4020 Unspecified coma: Secondary | ICD-10-CM | POA: Diagnosis not present

## 2018-11-02 LAB — URINALYSIS, ROUTINE W REFLEX MICROSCOPIC
Bilirubin Urine: NEGATIVE
Glucose, UA: NEGATIVE mg/dL
Ketones, ur: NEGATIVE mg/dL
Nitrite: NEGATIVE
Protein, ur: 30 mg/dL — AB
Specific Gravity, Urine: 1.009 (ref 1.005–1.030)
pH: 7 (ref 5.0–8.0)

## 2018-11-02 LAB — CBC WITH DIFFERENTIAL/PLATELET
Abs Immature Granulocytes: 0.04 10*3/uL (ref 0.00–0.07)
Basophils Absolute: 0 10*3/uL (ref 0.0–0.1)
Basophils Relative: 1 %
Eosinophils Absolute: 0 10*3/uL (ref 0.0–0.5)
Eosinophils Relative: 0 %
HCT: 31.3 % — ABNORMAL LOW (ref 36.0–46.0)
Hemoglobin: 9.7 g/dL — ABNORMAL LOW (ref 12.0–15.0)
Immature Granulocytes: 1 %
Lymphocytes Relative: 28 %
Lymphs Abs: 1.8 10*3/uL (ref 0.7–4.0)
MCH: 30.1 pg (ref 26.0–34.0)
MCHC: 31 g/dL (ref 30.0–36.0)
MCV: 97.2 fL (ref 80.0–100.0)
Monocytes Absolute: 0.3 10*3/uL (ref 0.1–1.0)
Monocytes Relative: 4 %
Neutro Abs: 4.3 10*3/uL (ref 1.7–7.7)
Neutrophils Relative %: 66 %
Platelets: 299 10*3/uL (ref 150–400)
RBC: 3.22 MIL/uL — ABNORMAL LOW (ref 3.87–5.11)
RDW: 14.7 % (ref 11.5–15.5)
WBC: 6.4 10*3/uL (ref 4.0–10.5)
nRBC: 0 % (ref 0.0–0.2)

## 2018-11-02 LAB — BASIC METABOLIC PANEL
Anion gap: 10 (ref 5–15)
BUN: 22 mg/dL (ref 8–23)
CO2: 22 mmol/L (ref 22–32)
Calcium: 9.4 mg/dL (ref 8.9–10.3)
Chloride: 105 mmol/L (ref 98–111)
Creatinine, Ser: 1.66 mg/dL — ABNORMAL HIGH (ref 0.44–1.00)
GFR calc Af Amer: 32 mL/min — ABNORMAL LOW (ref >60)
GFR calc non Af Amer: 28 mL/min — ABNORMAL LOW (ref >60)
Glucose, Bld: 113 mg/dL — ABNORMAL HIGH (ref 70–99)
Potassium: 4.4 mmol/L (ref 3.5–5.1)
Sodium: 137 mmol/L (ref 135–145)

## 2018-11-02 LAB — CBG MONITORING, ED: Glucose-Capillary: 99 mg/dL (ref 70–99)

## 2018-11-02 MED ORDER — SODIUM CHLORIDE 0.9 % IV BOLUS
500.0000 mL | Freq: Once | INTRAVENOUS | Status: AC
  Administered 2018-11-02: 13:00:00 500 mL via INTRAVENOUS

## 2018-11-02 MED ORDER — HYDRALAZINE HCL 25 MG PO TABS
25.0000 mg | ORAL_TABLET | Freq: Once | ORAL | Status: AC
  Administered 2018-11-02: 25 mg via ORAL
  Filled 2018-11-02: qty 1

## 2018-11-02 NOTE — ED Notes (Addendum)
Pt's son, Rozalyn Osland, would like to be contacted with updates and if any further is needed to call him. Number is [phone].

## 2018-11-02 NOTE — ED Provider Notes (Signed)
[facility] EMERGENCY DEPARTMENT Provider Note   CSN: [id number] Arrival date & time: 11/02/18  1128     History   Chief Complaint Chief Complaint  Patient presents with  . Loss of Consciousness    HPI Maureen Stout is a 85 y.o. female who presents with syncopal episode.  Past medical history significant for paroxysmal A. fib, hypertension, hyperlipidemia, diabetes, chronic kidney disease, urinary retention with Foley catheter, sinus bradycardia, anemia.  The patient states that she had a home health aide helping her take a shower and when she got out of the shower she suddenly felt weak, had a BM, and lost consciousness.  The next thing she remembered was the medics around her.  She states that she recently got out of the hospital a couple of weeks ago.  She was in the hospital for 5 weeks due to urosepsis was a complicated by acute pancreatitis, acute blood loss requiring blood transfusion, and anasarca requiring IV diuresis.  She was discharged home with home health.  She states that overall she has been doing well but has been weaker than normal.  She previously walked without a walker prior to her last admission and now she has to walk with a walker at all times. Currently she feels tired. She denies fever, cough, nausea, vomiting, diarrhea.  She denies any pain. Specifically no headache, chest pain, abdominal pain.  She had a large bowel movement during the syncopal episode.  She states that she took her morning meds this AM. I talked with her son, Vicente Serene, who states that overall she has been doing well since discharge. She has been getting stronger. He did note that his wife was in the home when the patient passed out and was told that her BP dropped to 29B systolic per EMS.   HPI  Past Medical History:  Diagnosis Date  . Acute on chronic anemia 09/07/2018  . Bladder problem   . CKD (chronic kidney disease), stage III ([place])   . Diabetes mellitus ([place])   .  Dyslipidemia   . Hypertension   . Paroxysmal A-fib ([place])   . Paroxysmal atrial flutter ([place])   . Sinus bradycardia     Patient Active Problem List   Diagnosis Date Noted  . Abnormality of gait 10/19/2018  . Acute blood loss anemia   . PAH (pulmonary artery hypertension) ([place])   . Acute combined systolic and diastolic congestive heart failure ([place])   . Edema   . Esophageal dysmotility   . Cardiomegaly   . Failure to thrive (child)   . Recurrent UTI   . CKD (chronic kidney disease), stage III ([place])   . Anemia of chronic disease   . Acute on chronic kidney failure ([place])   . Urinary retention   . Debility 09/19/2018  . Sepsis due to Enterobacter species ([place]) 09/08/2018  . Bacteremia 09/08/2018  . Hypoglycemia without diagnosis of diabetes mellitus 09/08/2018  . Acute on chronic anemia 09/07/2018  . Acute pancreatitis without infection or necrosis 09/07/2018  . Bradycardia 09/07/2018  . Sinus bradycardia 09/06/2018  . Hypothyroidism 09/06/2018  . First degree AV block 09/06/2018  . Abnormal CXR 04/13/2018  . On amiodarone therapy 01/08/2018  . Chronic anticoagulation 01/08/2018  . Hypertensive heart disease 01/08/2018  . Pressure injury of skin 04/19/2017  . Paroxysmal atrial fibrillation ([place]) 04/18/2017    Past Surgical History:  Procedure Laterality Date  . PARTIAL HYSTERECTOMY    . TOE SURGERY    . TONSILLECTOMY  OB History   No obstetric history on file.      Home Medications    Prior to Admission medications   Medication Sig Start Date End Date Taking? Authorizing Provider  amiodarone (PACERONE) 100 MG tablet Take 1 tablet (100 mg total) by mouth daily. 10/10/18   Love, Ivan Anchors, PA-C  amoxicillin (AMOXIL) 500 MG capsule Take 1 capsule (500 mg total) by mouth at bedtime. 10/10/18   Love, Ivan Anchors, PA-C  apixaban (ELIQUIS) 2.5 MG TABS tablet Take 1 tablet (2.5 mg total) by mouth 2 (two) times daily. 10/10/18   Love, Ivan Anchors, PA-C  atorvastatin (LIPITOR)  10 MG tablet Take 1 tablet (10 mg total) by mouth daily. 10/10/18   Love, Ivan Anchors, PA-C  Calcium Carbonate-Vitamin D (CALCIUM-D) 600-400 MG-UNIT TABS Take 1 tablet by mouth 2 (two) times daily. 10/10/18   Love, Ivan Anchors, PA-C  furosemide (LASIX) 40 MG tablet  10/21/18   [provider]  hydrALAZINE (APRESOLINE) 25 MG tablet Take 1 tablet (25 mg total) by mouth 3 (three) times daily. 10/10/18   Love, Ivan Anchors, PA-C  isosorbide dinitrate (ISORDIL) 10 MG tablet Take 1 tablet (10 mg total) by mouth 3 (three) times daily. 10/10/18   Love, Ivan Anchors, PA-C  levothyroxine (SYNTHROID) 50 MCG tablet  10/17/18   [provider]  levothyroxine (SYNTHROID) 75 MCG tablet Take 1 tablet (75 mcg total) by mouth daily before breakfast. 10/10/18   Love, Ivan Anchors, PA-C  Multiple Vitamin (MULTI-VITAMINS) TABS Take 1 tablet by mouth daily.     [provider]  polyethylene glycol (MIRALAX / GLYCOLAX) 17 g packet Take 17 g by mouth daily. For constipation. 10/11/18   Bary Leriche, PA-C  Wound Dressings (MEDIHONEY WOUND/BURN DRESSING) GEL Apply a small amount to wounds daily 04/27/18   Landis Martins, DPM    Family History Family History  Problem Relation Age of Onset  . Cancer Father   . Breast cancer Sister   . Diabetes Neg Hx   . Heart attack Neg Hx     Social History Social History   Tobacco Use  . Smoking status: Never Smoker  . Smokeless tobacco: Never Used  Substance Use Topics  . Alcohol use: No  . Drug use: No     Allergies   Vancomycin   Review of Systems Review of Systems  Constitutional: Positive for activity change (using walker now) and fatigue. Negative for appetite change, chills and fever.  Respiratory: Negative for cough and shortness of breath.   Cardiovascular: Negative for chest pain, palpitations and leg swelling.  Gastrointestinal: Negative for abdominal pain, blood in stool, constipation, diarrhea, nausea and vomiting.  Genitourinary: Positive for  difficulty urinating (has foley catheter). Negative for dysuria and flank pain.  Musculoskeletal: Negative for arthralgias and back pain.  Neurological: Positive for syncope. Negative for dizziness, seizures, weakness, light-headedness and headaches.  Psychiatric/Behavioral: Negative for confusion.  All other systems reviewed and are negative.    Physical Exam Updated Vital Signs BP (!) 182/84 (BP Location: Right Arm)   Pulse 71 Comment: Simultaneous filing. User may not have seen previous data.  Temp 97.6 F (36.4 C) (Oral)   Resp 15 Comment: Simultaneous filing. User may not have seen previous data.  SpO2 98% Comment: Simultaneous filing. User may not have seen previous data.  Physical Exam Vitals signs and nursing note reviewed.  Constitutional:      General: She is not in acute distress.    Appearance: Normal appearance. She  is well-developed. She is not ill-appearing.     Comments: Calm, cooperative. Pleasant elderly female in NAD. No confusion noted.  HENT:     Head: Normocephalic and atraumatic.  Eyes:     General: No scleral icterus.       Right eye: No discharge.        Left eye: No discharge.     Conjunctiva/sclera: Conjunctivae normal.     Pupils: Pupils are equal, round, and reactive to light.  Neck:     Musculoskeletal: Normal range of motion.  Cardiovascular:     Rate and Rhythm: Normal rate and regular rhythm.  Pulmonary:     Effort: Pulmonary effort is normal. No respiratory distress.     Breath sounds: Normal breath sounds.  Abdominal:     General: There is no distension.     Palpations: Abdomen is soft.     Tenderness: There is no abdominal tenderness.  Skin:    General: Skin is warm and dry.  Neurological:     Mental Status: She is alert and oriented to person, place, and time.  Psychiatric:        Behavior: Behavior normal.      ED Treatments / Results  Labs (all labs ordered are listed, but only abnormal results are displayed) Labs Reviewed   BASIC METABOLIC PANEL - Abnormal; Notable for the following components:      Result Value   Glucose, Bld 113 (*)    Creatinine, Ser 1.66 (*)    GFR calc non Af Amer 28 (*)    GFR calc Af Amer 32 (*)    All other components within normal limits  CBC WITH DIFFERENTIAL/PLATELET - Abnormal; Notable for the following components:   RBC 3.22 (*)    Hemoglobin 9.7 (*)    HCT 31.3 (*)    All other components within normal limits  URINALYSIS, ROUTINE W REFLEX MICROSCOPIC - Abnormal; Notable for the following components:   APPearance CLOUDY (*)    Hgb urine dipstick SMALL (*)    Protein, ur 30 (*)    Leukocytes,Ua LARGE (*)    Bacteria, UA MANY (*)    All other components within normal limits  CBG MONITORING, ED    EKG EKG Interpretation  Date/Time:  Wednesday November 02 2018 11:43:25 EDT Ventricular Rate:  70 PR Interval:    QRS Duration: 141 QT Interval:  454 QTC Calculation: 490 R Axis:   -37 Text Interpretation:  Sinus rhythm Left bundle branch block since last tracing no significant change Confirmed by Daleen Bo [id number]) on 11/02/2018 11:48:37 AM   Radiology No results found.  Procedures Procedures (including critical care time)  Medications Ordered in ED Medications  sodium chloride 0.9 % bolus 500 mL (500 mLs Intravenous New Bag/Given 11/02/18 1232)     Initial Impression / Assessment and Plan / ED Course  I have reviewed the triage vital signs and the nursing notes.  Pertinent labs & imaging results that were available during my care of the patient were reviewed by me and considered in my medical decision making (see chart for details).  85 year old female presents with syncopal episode while getting out of the shower today.  Family reports that her pulse and blood pressure dropped during the episode.  Blood pressure is elevated here and other vital signs are normal.  Her exam is unremarkable.  There is no trauma from the syncopal episode.  EKG is sinus rhythm with  left bundle branch block.  Will obtain blood  work, urine, give IV fluids and check orthostatics.  Shared visit with Dr. Eulis Foster  CBC is remarkable for improving hemoglobin.  It is 9.7 today.  Her kidney function is improved as well from her admission last month.  UA has large leukocytes, many bacteria, 21-50 white blood cells however patient is afebrile has no leukocytosis.  Suspect urine is colonized.  She has remained on the cardiac monitor without any events.  Her orthostatics were technically positive although she denied any lightheadedness with standing. Suspect she had a vasovagal event.   Orthostatic VS for the past 24 hrs (Last 3 readings):  BP- Lying Pulse- Lying BP- Sitting Pulse- Sitting BP- Standing at 0 minutes Pulse- Standing at 0 minutes BP- Standing at 3 minutes Pulse- Standing at 3 minutes  11/02/18 1216 175/67 64 162/79 67 144/67 84 144/67 84   Discussed with her son, Vicente Serene who is comfortable taking her home at this time. She has good family support and multiple therapist coming to see her and close outpatient follow up. Return precautions given.  Final Clinical Impressions(s) / ED Diagnoses   Final diagnoses:  Syncope, unspecified syncope type  Anemia, unspecified type  Chronic kidney disease, unspecified CKD stage    ED Discharge Orders    None       Recardo Evangelist, PA-C 11/02/18 1458    Daleen Bo, MD 11/03/18 1326    Daleen Bo, MD 11/03/18 1326

## 2018-11-02 NOTE — Discharge Instructions (Addendum)
Please continue working with therapy to increase your strength Drink plenty of fluids Follow up with your doctor

## 2018-11-02 NOTE — ED Notes (Signed)
Patient verbalizes understanding of discharge instructions. Opportunity for questioning and answers were provided. Armband removed by staff, pt discharged from ED. Reviewed d/c instructions with son via telephone.

## 2018-11-02 NOTE — ED Notes (Signed)
Assisted pt in calling her son, Vicente Serene

## 2018-11-02 NOTE — ED Provider Notes (Signed)
  Face-to-face evaluation   History: Patient presents for evaluation of weakness with reported syncope x2.  She came by EMS for evaluation.  She is being managed, at a rehab facility, following hospitalization, for prolonged period, discharged about 3 weeks ago.  She states that this morning she was taking a shower, felt weak and had sudden fecal incontinence and cannot recall anything else until the paramedics arrived.  She states an aide was with her at the time.  Physical exam: Alert, elderly female.  She is lucid.  Heart regular rate and rhythm without murmur lungs clear anteriorly.  Abdomen soft and nontender.  She moves arms and legs equally.  Medical screening examination/treatment/procedure(s) were conducted as a shared visit with non-physician practitioner(s) and myself.  I personally evaluated the patient during the encounter    Daleen Bo, MD 11/03/18 1326

## 2018-11-02 NOTE — ED Notes (Signed)
Called son, he will pick up pt in 15 min.

## 2018-11-02 NOTE — ED Notes (Signed)
Got patient undress on the monitor patient is resting with call bell in reach 

## 2018-11-02 NOTE — ED Triage Notes (Signed)
Pt from home for syncopal episode. Nurse aide found her bent over in the bathroom. When ems arrived they stood her up and she had another syncopal episode. Ems also reports she had a large BM , possible vagal? Pt arrives to ED alert and oriented. Foley in place from admission 3 weeks ago. Pt was here for 5 weeks due to sepsis. VSS.

## 2018-11-02 NOTE — ED Notes (Signed)
No pacer present. VO orthostatics.

## 2018-11-04 DIAGNOSIS — B952 Enterococcus as the cause of diseases classified elsewhere: Secondary | ICD-10-CM | POA: Diagnosis not present

## 2018-11-04 DIAGNOSIS — L97528 Non-pressure chronic ulcer of other part of left foot with other specified severity: Secondary | ICD-10-CM | POA: Diagnosis not present

## 2018-11-04 DIAGNOSIS — I130 Hypertensive heart and chronic kidney disease with heart failure and stage 1 through stage 4 chronic kidney disease, or unspecified chronic kidney disease: Secondary | ICD-10-CM | POA: Diagnosis not present

## 2018-11-04 DIAGNOSIS — N390 Urinary tract infection, site not specified: Secondary | ICD-10-CM | POA: Diagnosis not present

## 2018-11-04 DIAGNOSIS — E1122 Type 2 diabetes mellitus with diabetic chronic kidney disease: Secondary | ICD-10-CM | POA: Diagnosis not present

## 2018-11-04 DIAGNOSIS — E11621 Type 2 diabetes mellitus with foot ulcer: Secondary | ICD-10-CM | POA: Diagnosis not present

## 2018-11-04 DIAGNOSIS — I5041 Acute combined systolic (congestive) and diastolic (congestive) heart failure: Secondary | ICD-10-CM | POA: Diagnosis not present

## 2018-11-04 DIAGNOSIS — N183 Chronic kidney disease, stage 3 (moderate): Secondary | ICD-10-CM | POA: Diagnosis not present

## 2018-11-04 DIAGNOSIS — L97518 Non-pressure chronic ulcer of other part of right foot with other specified severity: Secondary | ICD-10-CM | POA: Diagnosis not present

## 2018-11-07 DIAGNOSIS — N189 Chronic kidney disease, unspecified: Secondary | ICD-10-CM | POA: Diagnosis not present

## 2018-11-07 DIAGNOSIS — D631 Anemia in chronic kidney disease: Secondary | ICD-10-CM | POA: Diagnosis not present

## 2018-11-08 DIAGNOSIS — N390 Urinary tract infection, site not specified: Secondary | ICD-10-CM | POA: Diagnosis not present

## 2018-11-08 DIAGNOSIS — L97518 Non-pressure chronic ulcer of other part of right foot with other specified severity: Secondary | ICD-10-CM | POA: Diagnosis not present

## 2018-11-08 DIAGNOSIS — L97528 Non-pressure chronic ulcer of other part of left foot with other specified severity: Secondary | ICD-10-CM | POA: Diagnosis not present

## 2018-11-08 DIAGNOSIS — E11621 Type 2 diabetes mellitus with foot ulcer: Secondary | ICD-10-CM | POA: Diagnosis not present

## 2018-11-08 DIAGNOSIS — I5041 Acute combined systolic (congestive) and diastolic (congestive) heart failure: Secondary | ICD-10-CM | POA: Diagnosis not present

## 2018-11-08 DIAGNOSIS — E1122 Type 2 diabetes mellitus with diabetic chronic kidney disease: Secondary | ICD-10-CM | POA: Diagnosis not present

## 2018-11-08 DIAGNOSIS — I130 Hypertensive heart and chronic kidney disease with heart failure and stage 1 through stage 4 chronic kidney disease, or unspecified chronic kidney disease: Secondary | ICD-10-CM | POA: Diagnosis not present

## 2018-11-08 DIAGNOSIS — N183 Chronic kidney disease, stage 3 (moderate): Secondary | ICD-10-CM | POA: Diagnosis not present

## 2018-11-08 DIAGNOSIS — B952 Enterococcus as the cause of diseases classified elsewhere: Secondary | ICD-10-CM | POA: Diagnosis not present

## 2018-11-09 DIAGNOSIS — L97518 Non-pressure chronic ulcer of other part of right foot with other specified severity: Secondary | ICD-10-CM | POA: Diagnosis not present

## 2018-11-09 DIAGNOSIS — N390 Urinary tract infection, site not specified: Secondary | ICD-10-CM | POA: Diagnosis not present

## 2018-11-09 DIAGNOSIS — N183 Chronic kidney disease, stage 3 (moderate): Secondary | ICD-10-CM | POA: Diagnosis not present

## 2018-11-09 DIAGNOSIS — I130 Hypertensive heart and chronic kidney disease with heart failure and stage 1 through stage 4 chronic kidney disease, or unspecified chronic kidney disease: Secondary | ICD-10-CM | POA: Diagnosis not present

## 2018-11-09 DIAGNOSIS — E1122 Type 2 diabetes mellitus with diabetic chronic kidney disease: Secondary | ICD-10-CM | POA: Diagnosis not present

## 2018-11-09 DIAGNOSIS — I5041 Acute combined systolic (congestive) and diastolic (congestive) heart failure: Secondary | ICD-10-CM | POA: Diagnosis not present

## 2018-11-09 DIAGNOSIS — L97528 Non-pressure chronic ulcer of other part of left foot with other specified severity: Secondary | ICD-10-CM | POA: Diagnosis not present

## 2018-11-09 DIAGNOSIS — B952 Enterococcus as the cause of diseases classified elsewhere: Secondary | ICD-10-CM | POA: Diagnosis not present

## 2018-11-09 DIAGNOSIS — E11621 Type 2 diabetes mellitus with foot ulcer: Secondary | ICD-10-CM | POA: Diagnosis not present

## 2018-11-10 DIAGNOSIS — N183 Chronic kidney disease, stage 3 (moderate): Secondary | ICD-10-CM | POA: Diagnosis not present

## 2018-11-10 DIAGNOSIS — B952 Enterococcus as the cause of diseases classified elsewhere: Secondary | ICD-10-CM | POA: Diagnosis not present

## 2018-11-10 DIAGNOSIS — E1122 Type 2 diabetes mellitus with diabetic chronic kidney disease: Secondary | ICD-10-CM | POA: Diagnosis not present

## 2018-11-10 DIAGNOSIS — L97518 Non-pressure chronic ulcer of other part of right foot with other specified severity: Secondary | ICD-10-CM | POA: Diagnosis not present

## 2018-11-10 DIAGNOSIS — E11621 Type 2 diabetes mellitus with foot ulcer: Secondary | ICD-10-CM | POA: Diagnosis not present

## 2018-11-10 DIAGNOSIS — I5041 Acute combined systolic (congestive) and diastolic (congestive) heart failure: Secondary | ICD-10-CM | POA: Diagnosis not present

## 2018-11-10 DIAGNOSIS — N390 Urinary tract infection, site not specified: Secondary | ICD-10-CM | POA: Diagnosis not present

## 2018-11-10 DIAGNOSIS — L97528 Non-pressure chronic ulcer of other part of left foot with other specified severity: Secondary | ICD-10-CM | POA: Diagnosis not present

## 2018-11-10 DIAGNOSIS — I130 Hypertensive heart and chronic kidney disease with heart failure and stage 1 through stage 4 chronic kidney disease, or unspecified chronic kidney disease: Secondary | ICD-10-CM | POA: Diagnosis not present

## 2018-11-13 DIAGNOSIS — L97518 Non-pressure chronic ulcer of other part of right foot with other specified severity: Secondary | ICD-10-CM | POA: Diagnosis not present

## 2018-11-13 DIAGNOSIS — E11621 Type 2 diabetes mellitus with foot ulcer: Secondary | ICD-10-CM | POA: Diagnosis not present

## 2018-11-13 DIAGNOSIS — E1122 Type 2 diabetes mellitus with diabetic chronic kidney disease: Secondary | ICD-10-CM | POA: Diagnosis not present

## 2018-11-13 DIAGNOSIS — I5041 Acute combined systolic (congestive) and diastolic (congestive) heart failure: Secondary | ICD-10-CM | POA: Diagnosis not present

## 2018-11-13 DIAGNOSIS — N390 Urinary tract infection, site not specified: Secondary | ICD-10-CM | POA: Diagnosis not present

## 2018-11-13 DIAGNOSIS — L97528 Non-pressure chronic ulcer of other part of left foot with other specified severity: Secondary | ICD-10-CM | POA: Diagnosis not present

## 2018-11-13 DIAGNOSIS — N183 Chronic kidney disease, stage 3 (moderate): Secondary | ICD-10-CM | POA: Diagnosis not present

## 2018-11-13 DIAGNOSIS — I130 Hypertensive heart and chronic kidney disease with heart failure and stage 1 through stage 4 chronic kidney disease, or unspecified chronic kidney disease: Secondary | ICD-10-CM | POA: Diagnosis not present

## 2018-11-13 DIAGNOSIS — B952 Enterococcus as the cause of diseases classified elsewhere: Secondary | ICD-10-CM | POA: Diagnosis not present

## 2018-11-15 DIAGNOSIS — N184 Chronic kidney disease, stage 4 (severe): Secondary | ICD-10-CM | POA: Diagnosis not present

## 2018-11-15 DIAGNOSIS — D631 Anemia in chronic kidney disease: Secondary | ICD-10-CM | POA: Diagnosis not present

## 2018-11-16 DIAGNOSIS — N390 Urinary tract infection, site not specified: Secondary | ICD-10-CM | POA: Diagnosis not present

## 2018-11-17 DIAGNOSIS — E1122 Type 2 diabetes mellitus with diabetic chronic kidney disease: Secondary | ICD-10-CM | POA: Diagnosis not present

## 2018-11-17 DIAGNOSIS — L97528 Non-pressure chronic ulcer of other part of left foot with other specified severity: Secondary | ICD-10-CM | POA: Diagnosis not present

## 2018-11-17 DIAGNOSIS — N183 Chronic kidney disease, stage 3 (moderate): Secondary | ICD-10-CM | POA: Diagnosis not present

## 2018-11-17 DIAGNOSIS — I130 Hypertensive heart and chronic kidney disease with heart failure and stage 1 through stage 4 chronic kidney disease, or unspecified chronic kidney disease: Secondary | ICD-10-CM | POA: Diagnosis not present

## 2018-11-17 DIAGNOSIS — I5041 Acute combined systolic (congestive) and diastolic (congestive) heart failure: Secondary | ICD-10-CM | POA: Diagnosis not present

## 2018-11-17 DIAGNOSIS — B952 Enterococcus as the cause of diseases classified elsewhere: Secondary | ICD-10-CM | POA: Diagnosis not present

## 2018-11-17 DIAGNOSIS — E11621 Type 2 diabetes mellitus with foot ulcer: Secondary | ICD-10-CM | POA: Diagnosis not present

## 2018-11-17 DIAGNOSIS — L97518 Non-pressure chronic ulcer of other part of right foot with other specified severity: Secondary | ICD-10-CM | POA: Diagnosis not present

## 2018-11-17 DIAGNOSIS — N390 Urinary tract infection, site not specified: Secondary | ICD-10-CM | POA: Diagnosis not present

## 2018-11-18 DIAGNOSIS — I5041 Acute combined systolic (congestive) and diastolic (congestive) heart failure: Secondary | ICD-10-CM | POA: Diagnosis not present

## 2018-11-18 DIAGNOSIS — N390 Urinary tract infection, site not specified: Secondary | ICD-10-CM | POA: Diagnosis not present

## 2018-11-18 DIAGNOSIS — B952 Enterococcus as the cause of diseases classified elsewhere: Secondary | ICD-10-CM | POA: Diagnosis not present

## 2018-11-18 DIAGNOSIS — L97528 Non-pressure chronic ulcer of other part of left foot with other specified severity: Secondary | ICD-10-CM | POA: Diagnosis not present

## 2018-11-18 DIAGNOSIS — N183 Chronic kidney disease, stage 3 (moderate): Secondary | ICD-10-CM | POA: Diagnosis not present

## 2018-11-18 DIAGNOSIS — E11621 Type 2 diabetes mellitus with foot ulcer: Secondary | ICD-10-CM | POA: Diagnosis not present

## 2018-11-18 DIAGNOSIS — I130 Hypertensive heart and chronic kidney disease with heart failure and stage 1 through stage 4 chronic kidney disease, or unspecified chronic kidney disease: Secondary | ICD-10-CM | POA: Diagnosis not present

## 2018-11-18 DIAGNOSIS — E1122 Type 2 diabetes mellitus with diabetic chronic kidney disease: Secondary | ICD-10-CM | POA: Diagnosis not present

## 2018-11-18 DIAGNOSIS — L97518 Non-pressure chronic ulcer of other part of right foot with other specified severity: Secondary | ICD-10-CM | POA: Diagnosis not present

## 2018-11-21 DIAGNOSIS — B952 Enterococcus as the cause of diseases classified elsewhere: Secondary | ICD-10-CM | POA: Diagnosis not present

## 2018-11-21 DIAGNOSIS — N390 Urinary tract infection, site not specified: Secondary | ICD-10-CM | POA: Diagnosis not present

## 2018-11-21 DIAGNOSIS — E1122 Type 2 diabetes mellitus with diabetic chronic kidney disease: Secondary | ICD-10-CM | POA: Diagnosis not present

## 2018-11-21 DIAGNOSIS — I5041 Acute combined systolic (congestive) and diastolic (congestive) heart failure: Secondary | ICD-10-CM | POA: Diagnosis not present

## 2018-11-21 DIAGNOSIS — N183 Chronic kidney disease, stage 3 (moderate): Secondary | ICD-10-CM | POA: Diagnosis not present

## 2018-11-21 DIAGNOSIS — I130 Hypertensive heart and chronic kidney disease with heart failure and stage 1 through stage 4 chronic kidney disease, or unspecified chronic kidney disease: Secondary | ICD-10-CM | POA: Diagnosis not present

## 2018-11-21 DIAGNOSIS — E11621 Type 2 diabetes mellitus with foot ulcer: Secondary | ICD-10-CM | POA: Diagnosis not present

## 2018-11-21 DIAGNOSIS — L97518 Non-pressure chronic ulcer of other part of right foot with other specified severity: Secondary | ICD-10-CM | POA: Diagnosis not present

## 2018-11-21 DIAGNOSIS — L97528 Non-pressure chronic ulcer of other part of left foot with other specified severity: Secondary | ICD-10-CM | POA: Diagnosis not present

## 2018-11-22 DIAGNOSIS — N183 Chronic kidney disease, stage 3 (moderate): Secondary | ICD-10-CM | POA: Diagnosis not present

## 2018-11-22 DIAGNOSIS — I130 Hypertensive heart and chronic kidney disease with heart failure and stage 1 through stage 4 chronic kidney disease, or unspecified chronic kidney disease: Secondary | ICD-10-CM | POA: Diagnosis not present

## 2018-11-22 DIAGNOSIS — E11621 Type 2 diabetes mellitus with foot ulcer: Secondary | ICD-10-CM | POA: Diagnosis not present

## 2018-11-22 DIAGNOSIS — L97518 Non-pressure chronic ulcer of other part of right foot with other specified severity: Secondary | ICD-10-CM | POA: Diagnosis not present

## 2018-11-22 DIAGNOSIS — B952 Enterococcus as the cause of diseases classified elsewhere: Secondary | ICD-10-CM | POA: Diagnosis not present

## 2018-11-22 DIAGNOSIS — L97528 Non-pressure chronic ulcer of other part of left foot with other specified severity: Secondary | ICD-10-CM | POA: Diagnosis not present

## 2018-11-22 DIAGNOSIS — E1122 Type 2 diabetes mellitus with diabetic chronic kidney disease: Secondary | ICD-10-CM | POA: Diagnosis not present

## 2018-11-22 DIAGNOSIS — I5041 Acute combined systolic (congestive) and diastolic (congestive) heart failure: Secondary | ICD-10-CM | POA: Diagnosis not present

## 2018-11-22 DIAGNOSIS — N390 Urinary tract infection, site not specified: Secondary | ICD-10-CM | POA: Diagnosis not present

## 2018-11-23 DIAGNOSIS — I130 Hypertensive heart and chronic kidney disease with heart failure and stage 1 through stage 4 chronic kidney disease, or unspecified chronic kidney disease: Secondary | ICD-10-CM | POA: Diagnosis not present

## 2018-11-23 DIAGNOSIS — L97518 Non-pressure chronic ulcer of other part of right foot with other specified severity: Secondary | ICD-10-CM | POA: Diagnosis not present

## 2018-11-23 DIAGNOSIS — L97528 Non-pressure chronic ulcer of other part of left foot with other specified severity: Secondary | ICD-10-CM | POA: Diagnosis not present

## 2018-11-23 DIAGNOSIS — E11621 Type 2 diabetes mellitus with foot ulcer: Secondary | ICD-10-CM | POA: Diagnosis not present

## 2018-11-23 DIAGNOSIS — B952 Enterococcus as the cause of diseases classified elsewhere: Secondary | ICD-10-CM | POA: Diagnosis not present

## 2018-11-23 DIAGNOSIS — E1122 Type 2 diabetes mellitus with diabetic chronic kidney disease: Secondary | ICD-10-CM | POA: Diagnosis not present

## 2018-11-23 DIAGNOSIS — N390 Urinary tract infection, site not specified: Secondary | ICD-10-CM | POA: Diagnosis not present

## 2018-11-23 DIAGNOSIS — N183 Chronic kidney disease, stage 3 (moderate): Secondary | ICD-10-CM | POA: Diagnosis not present

## 2018-11-23 DIAGNOSIS — I5041 Acute combined systolic (congestive) and diastolic (congestive) heart failure: Secondary | ICD-10-CM | POA: Diagnosis not present

## 2018-11-24 NOTE — Progress Notes (Signed)
Cardiology Office Note:    Date:  11/25/2018   ID:  Maureen Stout, DOB [birth year], MRN [id number]  PCP:  Nicoletta Dress, MD  Cardiologist:  Shirlee More, MD    Referring MD: Nicoletta Dress, MD    ASSESSMENT:    1. Chronic diastolic heart failure ([place])   2. Hypertensive heart and chronic kidney disease with heart failure and stage 1 through stage 4 chronic kidney disease, or chronic kidney disease (HCC)   3. Paroxysmal atrial fibrillation ([place])   4. On amiodarone therapy   5. Chronic anticoagulation   6. PAH (pulmonary artery hypertension) ([place])    PLAN:    In order of problems listed above:  1. Her heart failure is compensated she has no edema New York Heart Association class I.  I am concerned she had acute heart injury in the setting of overwhelming infection would like to recheck her echocardiogram.  Check labs for sake today today including proBNP renal function potassium and continue treatment with hydralazine and isosorbide.  She is not on ACE or arm with her CKD and her beta-blocker was discontinued in the hospital with bradycardia.  Obviously she is not on MRA with her CKD 2. Stable home blood pressure consistently runs 7/82/9562 systolic, just prior to coming to the office she was informed that her niece died in hospice.  Continue current antihypertensives hydralazine and Isordil 3. Stable CKD maintaining sinus rhythm on very low-dose amiodarone 4. Continue amiodarone check liver function TSH 5. Continued reduced dose of Eliquis check CBC with transfusion in hospital 6. Recheck echocardiogram physical exam does not suggest severe pulmonary artery hypertension  30 minutes was spent with the patient and reviewing her records prior to the visit with the patient who has no recall of her hospitalization.  Next appointment: 6 weeks   Medication Adjustments/Labs and Tests Ordered: Current medicines are reviewed at length with the patient today.  Concerns regarding  medicines are outlined above.  No orders of the defined types were placed in this encounter.  No orders of the defined types were placed in this encounter.   Chief Complaint  Patient presents with  . Follow-up    for   . Congestive Heart Failure    History of Present Illness:    Maureen Stout is an 85 year old woman with a history of atrial fibrillation hypertension dyslipidemia chronic anticoagulation last seen 04/12/2018.    She was admitted to  [facility] in May 2020 with heart failure and stage IV CKD.  Echocardiogram performed 09/29/2018 showed an ejection fraction of 30 to 35% mildly dilated ventricle and filling pattern with pseudo-normalized consistent with decompensated heart failure.  There is mild right ventricular dysfunction mild left and right atrial enlargement she had a large left pleural effusion and a trivial pericardial effusion.  Aortic valve was calcified with mild aortic regurgitation and no stenosis.  Her previous ejection fraction 04/29/2017 was normal 60 to 65%.  Her admission to the hospital was preceded by an admission with Enterobacter sepsis complicated with delirium pancreatitis and anemia requiring transfusion.  She was also found to have severe pulmonary artery hypertension.  She maintained sinus rhythm throughout that hospitalization and had bradycardia causing discontinuation of the beta-blocker.  Compliance with diet, lifestyle and medications: Yes  She is slowly and steadily improving in some she is lost over 30 pounds is not having edema shortness of breath chest pain palpitation or syncope but remains diffusely weak.  She has an indwelling Foley  catheter and is being followed by urology.  I reviewed her records prior to the visit with the patient during the visit she is maintaining sinus rhythm. Past Medical History:  Diagnosis Date  . Acute on chronic anemia 09/07/2018  . Bladder problem   . CKD (chronic kidney disease), stage III ([place])   .  Diabetes mellitus ([place])   . Dyslipidemia   . Hypertension   . Paroxysmal A-fib ([place])   . Paroxysmal atrial flutter ([place])   . Sinus bradycardia     Past Surgical History:  Procedure Laterality Date  . PARTIAL HYSTERECTOMY    . TOE SURGERY    . TONSILLECTOMY      Current Medications: Current Meds  Medication Sig  . amiodarone (PACERONE) 100 MG tablet Take 1 tablet (100 mg total) by mouth daily.  Marland Kitchen amoxicillin (AMOXIL) 500 MG capsule Take 1 capsule (500 mg total) by mouth at bedtime.  Marland Kitchen apixaban (ELIQUIS) 2.5 MG TABS tablet Take 1 tablet (2.5 mg total) by mouth 2 (two) times daily.  Marland Kitchen atorvastatin (LIPITOR) 10 MG tablet Take 1 tablet (10 mg total) by mouth daily.  . Calcium Carbonate-Vitamin D (CALCIUM-D) 600-400 MG-UNIT TABS Take 1 tablet by mouth 2 (two) times daily.  . furosemide (LASIX) 40 MG tablet Take 40 mg by mouth daily.   . hydrALAZINE (APRESOLINE) 25 MG tablet Take 12.5 mg by mouth 3 (three) times daily.  . isosorbide dinitrate (ISORDIL) 10 MG tablet Take 1 tablet (10 mg total) by mouth 3 (three) times daily.  Marland Kitchen levothyroxine (SYNTHROID) 75 MCG tablet Take 1 tablet (75 mcg total) by mouth daily before breakfast.  . Multiple Vitamin (MULTI-VITAMINS) TABS Take 1 tablet by mouth daily.   . polyethylene glycol (MIRALAX / GLYCOLAX) 17 g packet Take 17 g by mouth daily. For constipation.  . Wound Dressings (MEDIHONEY WOUND/BURN DRESSING) GEL Apply a small amount to wounds daily     Allergies:   Vancomycin   Social History   Socioeconomic History  . Marital status: Married    Spouse name: Not on file  . Number of children: Not on file  . Years of education: Not on file  . Highest education level: Not on file  Occupational History  . Not on file  Social Needs  . Financial resource strain: Not on file  . Food insecurity    Worry: Not on file    Inability: Not on file  . Transportation needs    Medical: Not on file    Non-medical: Not on file  Tobacco Use  . Smoking  status: Never Smoker  . Smokeless tobacco: Never Used  Substance and Sexual Activity  . Alcohol use: No  . Drug use: No  . Sexual activity: Not on file  Lifestyle  . Physical activity    Days per week: Not on file    Minutes per session: Not on file  . Stress: Not on file  Relationships  . Social Herbalist on phone: Not on file    Gets together: Not on file    Attends religious service: Not on file    Active member of club or organization: Not on file    Attends meetings of clubs or organizations: Not on file    Relationship status: Not on file  Other Topics Concern  . Not on file  Social History Narrative  . Not on file     Family History: The patient's family history includes Breast cancer in her sister;  Cancer in her father. There is no history of Diabetes or Heart attack. ROS:   Please see the history of present illness.    All other systems reviewed and are negative.  EKGs/Labs/Other Studies Reviewed:    The following studies were reviewed today:  EKG 11/02/2018:   [place] LBBB personally reviewed  EKG:  EKG ordered today and personally reviewed.  The ekg ordered today demonstrates sinus rhythm left axis deviation normal QT interval nonspecific conduction delay CXR 09/28/2018:   IMPRESSION: 1. Cardiomegaly with small to moderate-sized bilateral pleural effusions and generalized volume overload/pulmonary edema. 2. Enteric tube terminates below the left hemidiaphragm. 3. The previously ingested 13 mm barium tablet remains at the GE junction.  Recent Labs: 09/24/2018: Magnesium 1.3 09/26/2018: TSH 4.341 09/28/2018: B Natriuretic Peptide 241.8 09/30/2018: ALT 53 11/02/2018: BUN 22; Creatinine, Ser 1.66; Hemoglobin 9.7; Platelets 299; Potassium 4.4; Sodium 137  Recent Lipid Panel    Component Value Date/Time   CHOL 81 09/08/2018 0529   TRIG 33 09/08/2018 0529   HDL 37 (L) 09/08/2018 0529   CHOLHDL 2.2 09/08/2018 0529   VLDL 7 09/08/2018 0529   LDLCALC 37  09/08/2018 0529    Physical Exam:    VS:  BP (!) 178/92 (BP Location: Left Arm, Patient Position: Sitting, Cuff Size: Normal)   Pulse 75   Temp 99.5 F (37.5 C)   Ht 5\' 6"  (1.676 m)   Wt 156 lb 3.2 oz (70.9 kg)   SpO2 94%   BMI 25.21 kg/m     Wt Readings from Last 3 Encounters:  11/25/18 156 lb 3.2 oz (70.9 kg)  10/19/18 159 lb (72.1 kg)  10/10/18 170 lb 6.7 oz (77.3 kg)     GEN: She looks frail she is lost muscle and examined in a wheelchair well nourished, well developed in no acute distress HEENT: Normal NECK: No JVD; No carotid bruits LYMPHATICS: No lymphadenopathy CARDIAC: RRR, no murmurs, rubs, gallops RESPIRATORY:  Clear to auscultation without rales, wheezing or rhonchi  ABDOMEN: Soft, non-tender, non-distended MUSCULOSKELETAL:  No edema; No deformity  SKIN: Warm and dry NEUROLOGIC:  Alert and oriented x 3 PSYCHIATRIC:  Normal affect    Signed, Shirlee More, MD  11/25/2018 2:15 PM    [facility]

## 2018-11-25 ENCOUNTER — Encounter: Payer: Self-pay | Admitting: Cardiology

## 2018-11-25 ENCOUNTER — Other Ambulatory Visit: Payer: Self-pay

## 2018-11-25 ENCOUNTER — Ambulatory Visit (INDEPENDENT_AMBULATORY_CARE_PROVIDER_SITE_OTHER): Payer: Medicare PPO | Admitting: Cardiology

## 2018-11-25 VITALS — BP 178/92 | HR 75 | Temp 99.5°F | Ht 66.0 in | Wt 156.2 lb

## 2018-11-25 DIAGNOSIS — I2721 Secondary pulmonary arterial hypertension: Secondary | ICD-10-CM

## 2018-11-25 DIAGNOSIS — Z7901 Long term (current) use of anticoagulants: Secondary | ICD-10-CM

## 2018-11-25 DIAGNOSIS — I130 Hypertensive heart and chronic kidney disease with heart failure and stage 1 through stage 4 chronic kidney disease, or unspecified chronic kidney disease: Secondary | ICD-10-CM | POA: Diagnosis not present

## 2018-11-25 DIAGNOSIS — Z79899 Other long term (current) drug therapy: Secondary | ICD-10-CM | POA: Diagnosis not present

## 2018-11-25 DIAGNOSIS — I5032 Chronic diastolic (congestive) heart failure: Secondary | ICD-10-CM | POA: Diagnosis not present

## 2018-11-25 DIAGNOSIS — I480 Paroxysmal atrial fibrillation: Secondary | ICD-10-CM

## 2018-11-25 MED ORDER — ISOSORBIDE DINITRATE 10 MG PO TABS: 10.0000 mg | ORAL_TABLET | Freq: Three times a day (TID) | ORAL | 0 refills | Status: AC

## 2018-11-25 MED ORDER — HYDRALAZINE HCL 25 MG PO TABS: 12.5000 mg | ORAL_TABLET | Freq: Three times a day (TID) | ORAL | 0 refills | Status: AC

## 2018-11-25 NOTE — Patient Instructions (Signed)
Medication Instructions:  Your physician recommends that you continue on your current medications as directed. Please refer to the Current Medication list given to you today.  If you need a refill on your cardiac medications before your next appointment, please call your pharmacy.   Lab work: Your physician recommends that you return for lab work today: CMP, TSH, ProBNP, CBC.   If you have labs (blood work) drawn today and your tests are completely normal, you will receive your results only by: Marland Kitchen MyChart Message (if you have MyChart) OR . A paper copy in the mail If you have any lab test that is abnormal or we need to change your treatment, we will call you to review the results.  Testing/Procedures: You had an EKG today.   Your physician has requested that you have an echocardiogram. Echocardiography is a painless test that uses sound waves to create images of your heart. It provides your doctor with information about the size and shape of your heart and how well your heart's chambers and valves are working. This procedure takes approximately one hour. There are no restrictions for this procedure.  Follow-Up: At [facility], you and your health needs are our priority.  As part of our continuing mission to provide you with exceptional heart care, we have created designated Provider Care Teams.  These Care Teams include your primary Cardiologist (physician) and Advanced Practice Providers (APPs -  Physician Assistants and Nurse Practitioners) who all work together to provide you with the care you need, when you need it. You will need a follow up appointment in 6 weeks.

## 2018-11-26 DIAGNOSIS — L97528 Non-pressure chronic ulcer of other part of left foot with other specified severity: Secondary | ICD-10-CM | POA: Diagnosis not present

## 2018-11-26 DIAGNOSIS — L97518 Non-pressure chronic ulcer of other part of right foot with other specified severity: Secondary | ICD-10-CM | POA: Diagnosis not present

## 2018-11-26 DIAGNOSIS — N183 Chronic kidney disease, stage 3 (moderate): Secondary | ICD-10-CM | POA: Diagnosis not present

## 2018-11-26 DIAGNOSIS — E1122 Type 2 diabetes mellitus with diabetic chronic kidney disease: Secondary | ICD-10-CM | POA: Diagnosis not present

## 2018-11-26 DIAGNOSIS — B952 Enterococcus as the cause of diseases classified elsewhere: Secondary | ICD-10-CM | POA: Diagnosis not present

## 2018-11-26 DIAGNOSIS — N390 Urinary tract infection, site not specified: Secondary | ICD-10-CM | POA: Diagnosis not present

## 2018-11-26 DIAGNOSIS — E11621 Type 2 diabetes mellitus with foot ulcer: Secondary | ICD-10-CM | POA: Diagnosis not present

## 2018-11-26 DIAGNOSIS — I5041 Acute combined systolic (congestive) and diastolic (congestive) heart failure: Secondary | ICD-10-CM | POA: Diagnosis not present

## 2018-11-26 DIAGNOSIS — I130 Hypertensive heart and chronic kidney disease with heart failure and stage 1 through stage 4 chronic kidney disease, or unspecified chronic kidney disease: Secondary | ICD-10-CM | POA: Diagnosis not present

## 2018-11-26 LAB — COMPREHENSIVE METABOLIC PANEL
ALT: 32 IU/L (ref 0–32)
AST: 31 IU/L (ref 0–40)
Albumin/Globulin Ratio: 1.8 (ref 1.2–2.2)
Albumin: 4 g/dL (ref 3.6–4.6)
Alkaline Phosphatase: 112 IU/L (ref 39–117)
BUN/Creatinine Ratio: 11 — ABNORMAL LOW (ref 12–28)
BUN: 15 mg/dL (ref 8–27)
Bilirubin Total: 0.3 mg/dL (ref 0.0–1.2)
CO2: 20 mmol/L (ref 20–29)
Calcium: 9.3 mg/dL (ref 8.7–10.3)
Chloride: 95 mmol/L — ABNORMAL LOW (ref 96–106)
Creatinine, Ser: 1.39 mg/dL — ABNORMAL HIGH (ref 0.57–1.00)
GFR calc Af Amer: 40 mL/min/{1.73_m2} — ABNORMAL LOW (ref >59)
GFR calc non Af Amer: 35 mL/min/{1.73_m2} — ABNORMAL LOW (ref >59)
Globulin, Total: 2.2 g/dL (ref 1.5–4.5)
Glucose: 98 mg/dL (ref 65–99)
Potassium: 4 mmol/L (ref 3.5–5.2)
Sodium: 133 mmol/L — ABNORMAL LOW (ref 134–144)
Total Protein: 6.2 g/dL (ref 6.0–8.5)

## 2018-11-26 LAB — CBC
Hematocrit: 33.6 % — ABNORMAL LOW (ref 34.0–46.6)
Hemoglobin: 11.4 g/dL (ref 11.1–15.9)
MCH: 32 pg (ref 26.6–33.0)
MCHC: 33.9 g/dL (ref 31.5–35.7)
MCV: 94 fL (ref 79–97)
Platelets: 255 10*3/uL (ref 150–450)
RBC: 3.56 x10E6/uL — ABNORMAL LOW (ref 3.77–5.28)
RDW: 15.2 % (ref 11.7–15.4)
WBC: 5.3 10*3/uL (ref 3.4–10.8)

## 2018-11-26 LAB — TSH: TSH: 1.13 u[IU]/mL (ref 0.450–4.500)

## 2018-11-26 LAB — PRO B NATRIURETIC PEPTIDE: NT-Pro BNP: 2176 pg/mL — ABNORMAL HIGH (ref 0–738)

## 2018-11-29 DIAGNOSIS — D631 Anemia in chronic kidney disease: Secondary | ICD-10-CM | POA: Diagnosis not present

## 2018-11-29 DIAGNOSIS — N184 Chronic kidney disease, stage 4 (severe): Secondary | ICD-10-CM | POA: Diagnosis not present

## 2018-11-30 ENCOUNTER — Other Ambulatory Visit: Payer: Self-pay

## 2018-11-30 ENCOUNTER — Encounter: Payer: Medicare PPO | Attending: Physical Medicine & Rehabilitation | Admitting: Physical Medicine & Rehabilitation

## 2018-11-30 ENCOUNTER — Encounter: Payer: Self-pay | Admitting: Physical Medicine & Rehabilitation

## 2018-11-30 VITALS — BP 146/66 | HR 72 | Temp 98.4°F | Ht 66.0 in | Wt 155.0 lb

## 2018-11-30 DIAGNOSIS — L97528 Non-pressure chronic ulcer of other part of left foot with other specified severity: Secondary | ICD-10-CM | POA: Diagnosis not present

## 2018-11-30 DIAGNOSIS — R339 Retention of urine, unspecified: Secondary | ICD-10-CM

## 2018-11-30 DIAGNOSIS — I480 Paroxysmal atrial fibrillation: Secondary | ICD-10-CM | POA: Diagnosis not present

## 2018-11-30 DIAGNOSIS — E11621 Type 2 diabetes mellitus with foot ulcer: Secondary | ICD-10-CM | POA: Diagnosis not present

## 2018-11-30 DIAGNOSIS — N390 Urinary tract infection, site not specified: Secondary | ICD-10-CM | POA: Insufficient documentation

## 2018-11-30 DIAGNOSIS — E1122 Type 2 diabetes mellitus with diabetic chronic kidney disease: Secondary | ICD-10-CM | POA: Diagnosis not present

## 2018-11-30 DIAGNOSIS — N179 Acute kidney failure, unspecified: Secondary | ICD-10-CM | POA: Diagnosis not present

## 2018-11-30 DIAGNOSIS — I5041 Acute combined systolic (congestive) and diastolic (congestive) heart failure: Secondary | ICD-10-CM | POA: Insufficient documentation

## 2018-11-30 DIAGNOSIS — N183 Chronic kidney disease, stage 3 unspecified: Secondary | ICD-10-CM

## 2018-11-30 DIAGNOSIS — R531 Weakness: Secondary | ICD-10-CM | POA: Diagnosis not present

## 2018-11-30 DIAGNOSIS — D649 Anemia, unspecified: Secondary | ICD-10-CM | POA: Insufficient documentation

## 2018-11-30 DIAGNOSIS — R5381 Other malaise: Secondary | ICD-10-CM | POA: Diagnosis not present

## 2018-11-30 DIAGNOSIS — I130 Hypertensive heart and chronic kidney disease with heart failure and stage 1 through stage 4 chronic kidney disease, or unspecified chronic kidney disease: Secondary | ICD-10-CM | POA: Insufficient documentation

## 2018-11-30 DIAGNOSIS — B9689 Other specified bacterial agents as the cause of diseases classified elsewhere: Secondary | ICD-10-CM | POA: Insufficient documentation

## 2018-11-30 DIAGNOSIS — G9340 Encephalopathy, unspecified: Secondary | ICD-10-CM | POA: Diagnosis not present

## 2018-11-30 DIAGNOSIS — R269 Unspecified abnormalities of gait and mobility: Secondary | ICD-10-CM | POA: Diagnosis not present

## 2018-11-30 DIAGNOSIS — E785 Hyperlipidemia, unspecified: Secondary | ICD-10-CM | POA: Insufficient documentation

## 2018-11-30 DIAGNOSIS — N321 Vesicointestinal fistula: Secondary | ICD-10-CM | POA: Diagnosis not present

## 2018-11-30 DIAGNOSIS — L97518 Non-pressure chronic ulcer of other part of right foot with other specified severity: Secondary | ICD-10-CM | POA: Diagnosis not present

## 2018-11-30 DIAGNOSIS — B952 Enterococcus as the cause of diseases classified elsewhere: Secondary | ICD-10-CM | POA: Diagnosis not present

## 2018-11-30 NOTE — Progress Notes (Signed)
Subjective:    Patient ID: Maureen Stout, female    DOB: [birth year], 85 y.o.   MRN: [id number]  HPI 85 year old female with history of T2DM, PAF, CKD, enterovesicular fistula, sinus bradycardia presents for hospital follow-up for debility and encephalopathy secondary to multiple medical reasons.  Daughter-in-law supplements history. Last clinic visit 10/19/2018.  Since that time, pt went to the ED for syncope, notes reviewed, thought to be secondary vasovagal. She is still in therapies. She is still on prophylactic abx. She is following up with Urology, cont foley. She is following up with Nephro. BP is relatively controlled. She saw Cards.  Denies falls, using walker at home.   Pain Inventory Average Pain 0 Pain Right Now 0 My pain is na  In the last 24 hours, has pain interfered with the following? General activity 0 Relation with others 0 Enjoyment of life 0 What TIME of day is your pain at its worst? na Sleep (in general) na  Pain is worse with: na Pain improves with: na Relief from Meds: na  Mobility walk without assistance use a walker ability to climb steps?  no do you drive?  no  Function retired I need assistance with the following:  dressing, bathing, toileting, meal prep, household duties and shopping  Neuro/Psych tremor trouble walking  Prior Studies Any changes since last visit?  no  Physicians involved in your care Any changes since last visit?  no   Family History  Problem Relation Age of Onset  . Cancer Father   . Breast cancer Sister   . Diabetes Neg Hx   . Heart attack Neg Hx    Social History   Socioeconomic History  . Marital status: Married    Spouse name: Not on file  . Number of children: Not on file  . Years of education: Not on file  . Highest education level: Not on file  Occupational History  . Not on file  Social Needs  . Financial resource strain: Not on file  . Food insecurity    Worry: Not on file    Inability: Not on  file  . Transportation needs    Medical: Not on file    Non-medical: Not on file  Tobacco Use  . Smoking status: Never Smoker  . Smokeless tobacco: Never Used  Substance and Sexual Activity  . Alcohol use: No  . Drug use: No  . Sexual activity: Not on file  Lifestyle  . Physical activity    Days per week: Not on file    Minutes per session: Not on file  . Stress: Not on file  Relationships  . Social Herbalist on phone: Not on file    Gets together: Not on file    Attends religious service: Not on file    Active member of club or organization: Not on file    Attends meetings of clubs or organizations: Not on file    Relationship status: Not on file  Other Topics Concern  . Not on file  Social History Narrative  . Not on file   Past Surgical History:  Procedure Laterality Date  . PARTIAL HYSTERECTOMY    . TOE SURGERY    . TONSILLECTOMY     Past Medical History:  Diagnosis Date  . Acute on chronic anemia 09/07/2018  . Bladder problem   . CKD (chronic kidney disease), stage III ([place])   . Diabetes mellitus ([place])   . Dyslipidemia   .  Hypertension   . Paroxysmal A-fib ([place])   . Paroxysmal atrial flutter ([place])   . Sinus bradycardia    BP (!) 146/66   Pulse 72   Temp 98.4 F (36.9 C)   Ht 5\' 6"  (1.676 m)   Wt 155 lb (70.3 kg)   SpO2 97%   BMI 25.02 kg/m   Opioid Risk Score:   Fall Risk Score:  `1  Depression screen PHQ 2/9  No flowsheet data found.   Review of Systems  Constitutional: Negative.   HENT: Negative.   Eyes: Negative.   Respiratory: Negative.   Cardiovascular: Negative.   Gastrointestinal: Negative.   Endocrine: Negative.   Genitourinary: Positive for difficulty urinating.  Musculoskeletal: Positive for gait problem.  Skin: Negative.   Allergic/Immunologic: Negative.   Neurological: Positive for tremors.  Hematological: Negative.   Psychiatric/Behavioral: Negative.   All other systems reviewed and are negative.       Objective:   Physical Exam Constitutional: No distress . Vital signs reviewed. HENT: Normocephalic. Atraumatic. Eyes: EOMI. No discharge. Cardiovascular: No JVD. Respiratory: Normal effort.   GI: Non-distended. Musc: Generalized edema, improving Neurological: Alert and oriented x3. Motor: Grossly 4/5 throughout Skin: Frail skin. Psychiatric: Normal mood. Normal behavior.     Assessment & Plan:  85 year old female with history of T2DM, PAF, CKD, enterovesicular fistula, sinus bradycardia presents for hospital follow-up for debility and encephalopathy secondary to multiple medical reasons.  1.  Functional deficits and weakness secondary to debility and encephalopathy after urosepsis/pancreatitis and multiple medical issues  Encephalopathy resolved  Patient now with hired help during the day and alone at night - educated on safety at night  2. Enterococcus bacteremia/UTI:   Cont prophylactic amoxacillin 500 mg nightly   3. Urinary retention with recurrent UTIs:   Cont follow up with Urology, remains with foley at present  4. Acute on Chronic renal failure:    Cont follow up with Nephro  5.  Acute combined CHF  Cont follow up with Cards  6. Gait abnormality  Cont walker for safety  Cont therapies

## 2018-12-01 DIAGNOSIS — I5041 Acute combined systolic (congestive) and diastolic (congestive) heart failure: Secondary | ICD-10-CM | POA: Diagnosis not present

## 2018-12-01 DIAGNOSIS — E1122 Type 2 diabetes mellitus with diabetic chronic kidney disease: Secondary | ICD-10-CM | POA: Diagnosis not present

## 2018-12-01 DIAGNOSIS — Z1231 Encounter for screening mammogram for malignant neoplasm of breast: Secondary | ICD-10-CM | POA: Diagnosis not present

## 2018-12-01 DIAGNOSIS — Z0000 Encounter for general adult medical examination without abnormal findings: Secondary | ICD-10-CM | POA: Diagnosis not present

## 2018-12-01 DIAGNOSIS — N390 Urinary tract infection, site not specified: Secondary | ICD-10-CM | POA: Diagnosis not present

## 2018-12-01 DIAGNOSIS — I130 Hypertensive heart and chronic kidney disease with heart failure and stage 1 through stage 4 chronic kidney disease, or unspecified chronic kidney disease: Secondary | ICD-10-CM | POA: Diagnosis not present

## 2018-12-01 DIAGNOSIS — N183 Chronic kidney disease, stage 3 (moderate): Secondary | ICD-10-CM | POA: Diagnosis not present

## 2018-12-01 DIAGNOSIS — B952 Enterococcus as the cause of diseases classified elsewhere: Secondary | ICD-10-CM | POA: Diagnosis not present

## 2018-12-01 DIAGNOSIS — Z136 Encounter for screening for cardiovascular disorders: Secondary | ICD-10-CM | POA: Diagnosis not present

## 2018-12-01 DIAGNOSIS — Z9181 History of falling: Secondary | ICD-10-CM | POA: Diagnosis not present

## 2018-12-01 DIAGNOSIS — E11621 Type 2 diabetes mellitus with foot ulcer: Secondary | ICD-10-CM | POA: Diagnosis not present

## 2018-12-01 DIAGNOSIS — Z1331 Encounter for screening for depression: Secondary | ICD-10-CM | POA: Diagnosis not present

## 2018-12-01 DIAGNOSIS — L97528 Non-pressure chronic ulcer of other part of left foot with other specified severity: Secondary | ICD-10-CM | POA: Diagnosis not present

## 2018-12-01 DIAGNOSIS — L97518 Non-pressure chronic ulcer of other part of right foot with other specified severity: Secondary | ICD-10-CM | POA: Diagnosis not present

## 2018-12-01 DIAGNOSIS — N959 Unspecified menopausal and perimenopausal disorder: Secondary | ICD-10-CM | POA: Diagnosis not present

## 2018-12-01 DIAGNOSIS — E785 Hyperlipidemia, unspecified: Secondary | ICD-10-CM | POA: Diagnosis not present

## 2018-12-02 DIAGNOSIS — B952 Enterococcus as the cause of diseases classified elsewhere: Secondary | ICD-10-CM | POA: Diagnosis not present

## 2018-12-02 DIAGNOSIS — E11621 Type 2 diabetes mellitus with foot ulcer: Secondary | ICD-10-CM | POA: Diagnosis not present

## 2018-12-02 DIAGNOSIS — L97528 Non-pressure chronic ulcer of other part of left foot with other specified severity: Secondary | ICD-10-CM | POA: Diagnosis not present

## 2018-12-02 DIAGNOSIS — E1122 Type 2 diabetes mellitus with diabetic chronic kidney disease: Secondary | ICD-10-CM | POA: Diagnosis not present

## 2018-12-02 DIAGNOSIS — N390 Urinary tract infection, site not specified: Secondary | ICD-10-CM | POA: Diagnosis not present

## 2018-12-02 DIAGNOSIS — I130 Hypertensive heart and chronic kidney disease with heart failure and stage 1 through stage 4 chronic kidney disease, or unspecified chronic kidney disease: Secondary | ICD-10-CM | POA: Diagnosis not present

## 2018-12-02 DIAGNOSIS — L97518 Non-pressure chronic ulcer of other part of right foot with other specified severity: Secondary | ICD-10-CM | POA: Diagnosis not present

## 2018-12-02 DIAGNOSIS — I5041 Acute combined systolic (congestive) and diastolic (congestive) heart failure: Secondary | ICD-10-CM | POA: Diagnosis not present

## 2018-12-02 DIAGNOSIS — N183 Chronic kidney disease, stage 3 (moderate): Secondary | ICD-10-CM | POA: Diagnosis not present

## 2018-12-05 DIAGNOSIS — L97518 Non-pressure chronic ulcer of other part of right foot with other specified severity: Secondary | ICD-10-CM | POA: Diagnosis not present

## 2018-12-05 DIAGNOSIS — E11621 Type 2 diabetes mellitus with foot ulcer: Secondary | ICD-10-CM | POA: Diagnosis not present

## 2018-12-05 DIAGNOSIS — N390 Urinary tract infection, site not specified: Secondary | ICD-10-CM | POA: Diagnosis not present

## 2018-12-05 DIAGNOSIS — L97528 Non-pressure chronic ulcer of other part of left foot with other specified severity: Secondary | ICD-10-CM | POA: Diagnosis not present

## 2018-12-05 DIAGNOSIS — N183 Chronic kidney disease, stage 3 (moderate): Secondary | ICD-10-CM | POA: Diagnosis not present

## 2018-12-05 DIAGNOSIS — I5041 Acute combined systolic (congestive) and diastolic (congestive) heart failure: Secondary | ICD-10-CM | POA: Diagnosis not present

## 2018-12-05 DIAGNOSIS — B952 Enterococcus as the cause of diseases classified elsewhere: Secondary | ICD-10-CM | POA: Diagnosis not present

## 2018-12-05 DIAGNOSIS — E1122 Type 2 diabetes mellitus with diabetic chronic kidney disease: Secondary | ICD-10-CM | POA: Diagnosis not present

## 2018-12-05 DIAGNOSIS — I130 Hypertensive heart and chronic kidney disease with heart failure and stage 1 through stage 4 chronic kidney disease, or unspecified chronic kidney disease: Secondary | ICD-10-CM | POA: Diagnosis not present

## 2018-12-07 DIAGNOSIS — L97518 Non-pressure chronic ulcer of other part of right foot with other specified severity: Secondary | ICD-10-CM | POA: Diagnosis not present

## 2018-12-07 DIAGNOSIS — N183 Chronic kidney disease, stage 3 (moderate): Secondary | ICD-10-CM | POA: Diagnosis not present

## 2018-12-07 DIAGNOSIS — I130 Hypertensive heart and chronic kidney disease with heart failure and stage 1 through stage 4 chronic kidney disease, or unspecified chronic kidney disease: Secondary | ICD-10-CM | POA: Diagnosis not present

## 2018-12-07 DIAGNOSIS — I5041 Acute combined systolic (congestive) and diastolic (congestive) heart failure: Secondary | ICD-10-CM | POA: Diagnosis not present

## 2018-12-07 DIAGNOSIS — N390 Urinary tract infection, site not specified: Secondary | ICD-10-CM | POA: Diagnosis not present

## 2018-12-07 DIAGNOSIS — L97528 Non-pressure chronic ulcer of other part of left foot with other specified severity: Secondary | ICD-10-CM | POA: Diagnosis not present

## 2018-12-07 DIAGNOSIS — E1122 Type 2 diabetes mellitus with diabetic chronic kidney disease: Secondary | ICD-10-CM | POA: Diagnosis not present

## 2018-12-07 DIAGNOSIS — B952 Enterococcus as the cause of diseases classified elsewhere: Secondary | ICD-10-CM | POA: Diagnosis not present

## 2018-12-07 DIAGNOSIS — E11621 Type 2 diabetes mellitus with foot ulcer: Secondary | ICD-10-CM | POA: Diagnosis not present

## 2018-12-08 ENCOUNTER — Other Ambulatory Visit: Payer: Self-pay | Admitting: *Deleted

## 2018-12-08 ENCOUNTER — Telehealth: Payer: Self-pay | Admitting: *Deleted

## 2018-12-08 DIAGNOSIS — E11621 Type 2 diabetes mellitus with foot ulcer: Secondary | ICD-10-CM | POA: Diagnosis not present

## 2018-12-08 DIAGNOSIS — L97528 Non-pressure chronic ulcer of other part of left foot with other specified severity: Secondary | ICD-10-CM | POA: Diagnosis not present

## 2018-12-08 DIAGNOSIS — L97518 Non-pressure chronic ulcer of other part of right foot with other specified severity: Secondary | ICD-10-CM | POA: Diagnosis not present

## 2018-12-08 DIAGNOSIS — B952 Enterococcus as the cause of diseases classified elsewhere: Secondary | ICD-10-CM | POA: Diagnosis not present

## 2018-12-08 DIAGNOSIS — E1122 Type 2 diabetes mellitus with diabetic chronic kidney disease: Secondary | ICD-10-CM | POA: Diagnosis not present

## 2018-12-08 DIAGNOSIS — N390 Urinary tract infection, site not specified: Secondary | ICD-10-CM | POA: Diagnosis not present

## 2018-12-08 DIAGNOSIS — I5041 Acute combined systolic (congestive) and diastolic (congestive) heart failure: Secondary | ICD-10-CM | POA: Diagnosis not present

## 2018-12-08 DIAGNOSIS — I130 Hypertensive heart and chronic kidney disease with heart failure and stage 1 through stage 4 chronic kidney disease, or unspecified chronic kidney disease: Secondary | ICD-10-CM | POA: Diagnosis not present

## 2018-12-08 DIAGNOSIS — N183 Chronic kidney disease, stage 3 (moderate): Secondary | ICD-10-CM | POA: Diagnosis not present

## 2018-12-08 MED ORDER — AMIODARONE HCL 100 MG PO TABS: 100.0000 mg | ORAL_TABLET | Freq: Every day | ORAL | 1 refills | Status: DC

## 2018-12-08 NOTE — Telephone Encounter (Signed)
*  STAT* If patient is at the pharmacy, call can be transferred to refill team.   1. Which medications need to be refilled? (please list name of each medication and dose if known) Amiodarone 200 mg  2. Which pharmacy/location (including street and city if local pharmacy) is medication to be sent to?[facility]   3. Do they need a 30 day or 90 day supply?

## 2018-12-08 NOTE — Telephone Encounter (Signed)
Refill sent.

## 2018-12-12 DIAGNOSIS — L97528 Non-pressure chronic ulcer of other part of left foot with other specified severity: Secondary | ICD-10-CM | POA: Diagnosis not present

## 2018-12-12 DIAGNOSIS — I5041 Acute combined systolic (congestive) and diastolic (congestive) heart failure: Secondary | ICD-10-CM | POA: Diagnosis not present

## 2018-12-12 DIAGNOSIS — I130 Hypertensive heart and chronic kidney disease with heart failure and stage 1 through stage 4 chronic kidney disease, or unspecified chronic kidney disease: Secondary | ICD-10-CM | POA: Diagnosis not present

## 2018-12-12 DIAGNOSIS — N183 Chronic kidney disease, stage 3 (moderate): Secondary | ICD-10-CM | POA: Diagnosis not present

## 2018-12-12 DIAGNOSIS — L97518 Non-pressure chronic ulcer of other part of right foot with other specified severity: Secondary | ICD-10-CM | POA: Diagnosis not present

## 2018-12-12 DIAGNOSIS — B952 Enterococcus as the cause of diseases classified elsewhere: Secondary | ICD-10-CM | POA: Diagnosis not present

## 2018-12-12 DIAGNOSIS — E1122 Type 2 diabetes mellitus with diabetic chronic kidney disease: Secondary | ICD-10-CM | POA: Diagnosis not present

## 2018-12-12 DIAGNOSIS — N390 Urinary tract infection, site not specified: Secondary | ICD-10-CM | POA: Diagnosis not present

## 2018-12-12 DIAGNOSIS — E11621 Type 2 diabetes mellitus with foot ulcer: Secondary | ICD-10-CM | POA: Diagnosis not present

## 2018-12-13 DIAGNOSIS — N390 Urinary tract infection, site not specified: Secondary | ICD-10-CM | POA: Diagnosis not present

## 2018-12-13 DIAGNOSIS — E11621 Type 2 diabetes mellitus with foot ulcer: Secondary | ICD-10-CM | POA: Diagnosis not present

## 2018-12-13 DIAGNOSIS — I5043 Acute on chronic combined systolic (congestive) and diastolic (congestive) heart failure: Secondary | ICD-10-CM | POA: Diagnosis not present

## 2018-12-13 DIAGNOSIS — N183 Chronic kidney disease, stage 3 (moderate): Secondary | ICD-10-CM | POA: Diagnosis not present

## 2018-12-13 DIAGNOSIS — L97518 Non-pressure chronic ulcer of other part of right foot with other specified severity: Secondary | ICD-10-CM | POA: Diagnosis not present

## 2018-12-13 DIAGNOSIS — L97528 Non-pressure chronic ulcer of other part of left foot with other specified severity: Secondary | ICD-10-CM | POA: Diagnosis not present

## 2018-12-13 DIAGNOSIS — I130 Hypertensive heart and chronic kidney disease with heart failure and stage 1 through stage 4 chronic kidney disease, or unspecified chronic kidney disease: Secondary | ICD-10-CM | POA: Diagnosis not present

## 2018-12-13 DIAGNOSIS — E1122 Type 2 diabetes mellitus with diabetic chronic kidney disease: Secondary | ICD-10-CM | POA: Diagnosis not present

## 2018-12-13 DIAGNOSIS — B952 Enterococcus as the cause of diseases classified elsewhere: Secondary | ICD-10-CM | POA: Diagnosis not present

## 2018-12-13 DIAGNOSIS — I5041 Acute combined systolic (congestive) and diastolic (congestive) heart failure: Secondary | ICD-10-CM | POA: Diagnosis not present

## 2018-12-14 ENCOUNTER — Other Ambulatory Visit: Payer: Self-pay

## 2018-12-14 DIAGNOSIS — I130 Hypertensive heart and chronic kidney disease with heart failure and stage 1 through stage 4 chronic kidney disease, or unspecified chronic kidney disease: Secondary | ICD-10-CM | POA: Diagnosis not present

## 2018-12-14 DIAGNOSIS — N390 Urinary tract infection, site not specified: Secondary | ICD-10-CM | POA: Diagnosis not present

## 2018-12-14 DIAGNOSIS — I5041 Acute combined systolic (congestive) and diastolic (congestive) heart failure: Secondary | ICD-10-CM | POA: Diagnosis not present

## 2018-12-14 DIAGNOSIS — E11621 Type 2 diabetes mellitus with foot ulcer: Secondary | ICD-10-CM | POA: Diagnosis not present

## 2018-12-14 DIAGNOSIS — L97518 Non-pressure chronic ulcer of other part of right foot with other specified severity: Secondary | ICD-10-CM | POA: Diagnosis not present

## 2018-12-14 DIAGNOSIS — N183 Chronic kidney disease, stage 3 (moderate): Secondary | ICD-10-CM | POA: Diagnosis not present

## 2018-12-14 DIAGNOSIS — L97528 Non-pressure chronic ulcer of other part of left foot with other specified severity: Secondary | ICD-10-CM | POA: Diagnosis not present

## 2018-12-14 DIAGNOSIS — E1122 Type 2 diabetes mellitus with diabetic chronic kidney disease: Secondary | ICD-10-CM | POA: Diagnosis not present

## 2018-12-14 DIAGNOSIS — B952 Enterococcus as the cause of diseases classified elsewhere: Secondary | ICD-10-CM | POA: Diagnosis not present

## 2018-12-14 MED ORDER — AMIODARONE HCL 200 MG PO TABS: 100.0000 mg | ORAL_TABLET | Freq: Every day | ORAL | 0 refills | Status: DC

## 2018-12-14 NOTE — Telephone Encounter (Signed)
Rx for amiodarone 200mg  take 0.5 tablet daily sent to [facility]. Patient aware that Rx has been sent to pharmacy.  Patient advised to take amiodarone 200mg  0.5 tablet daily to equal 100mg  daily.  Patient agreed to plan and verbalized understanding. No further questions.

## 2018-12-20 DIAGNOSIS — E1122 Type 2 diabetes mellitus with diabetic chronic kidney disease: Secondary | ICD-10-CM | POA: Diagnosis not present

## 2018-12-20 DIAGNOSIS — L97528 Non-pressure chronic ulcer of other part of left foot with other specified severity: Secondary | ICD-10-CM | POA: Diagnosis not present

## 2018-12-20 DIAGNOSIS — I5041 Acute combined systolic (congestive) and diastolic (congestive) heart failure: Secondary | ICD-10-CM | POA: Diagnosis not present

## 2018-12-20 DIAGNOSIS — I130 Hypertensive heart and chronic kidney disease with heart failure and stage 1 through stage 4 chronic kidney disease, or unspecified chronic kidney disease: Secondary | ICD-10-CM | POA: Diagnosis not present

## 2018-12-20 DIAGNOSIS — L97518 Non-pressure chronic ulcer of other part of right foot with other specified severity: Secondary | ICD-10-CM | POA: Diagnosis not present

## 2018-12-20 DIAGNOSIS — E11621 Type 2 diabetes mellitus with foot ulcer: Secondary | ICD-10-CM | POA: Diagnosis not present

## 2018-12-20 DIAGNOSIS — N390 Urinary tract infection, site not specified: Secondary | ICD-10-CM | POA: Diagnosis not present

## 2018-12-20 DIAGNOSIS — N183 Chronic kidney disease, stage 3 (moderate): Secondary | ICD-10-CM | POA: Diagnosis not present

## 2018-12-20 DIAGNOSIS — B952 Enterococcus as the cause of diseases classified elsewhere: Secondary | ICD-10-CM | POA: Diagnosis not present

## 2018-12-20 DIAGNOSIS — R339 Retention of urine, unspecified: Secondary | ICD-10-CM | POA: Diagnosis not present

## 2018-12-20 DIAGNOSIS — N184 Chronic kidney disease, stage 4 (severe): Secondary | ICD-10-CM | POA: Diagnosis not present

## 2018-12-22 DIAGNOSIS — I951 Orthostatic hypotension: Secondary | ICD-10-CM | POA: Diagnosis not present

## 2018-12-22 DIAGNOSIS — N183 Chronic kidney disease, stage 3 (moderate): Secondary | ICD-10-CM | POA: Diagnosis not present

## 2018-12-22 DIAGNOSIS — L97528 Non-pressure chronic ulcer of other part of left foot with other specified severity: Secondary | ICD-10-CM | POA: Diagnosis not present

## 2018-12-22 DIAGNOSIS — I130 Hypertensive heart and chronic kidney disease with heart failure and stage 1 through stage 4 chronic kidney disease, or unspecified chronic kidney disease: Secondary | ICD-10-CM | POA: Diagnosis not present

## 2018-12-22 DIAGNOSIS — E11621 Type 2 diabetes mellitus with foot ulcer: Secondary | ICD-10-CM | POA: Diagnosis not present

## 2018-12-22 DIAGNOSIS — E871 Hypo-osmolality and hyponatremia: Secondary | ICD-10-CM | POA: Diagnosis not present

## 2018-12-22 DIAGNOSIS — I4891 Unspecified atrial fibrillation: Secondary | ICD-10-CM | POA: Diagnosis not present

## 2018-12-22 DIAGNOSIS — R4020 Unspecified coma: Secondary | ICD-10-CM | POA: Diagnosis not present

## 2018-12-22 DIAGNOSIS — I351 Nonrheumatic aortic (valve) insufficiency: Secondary | ICD-10-CM | POA: Diagnosis not present

## 2018-12-22 DIAGNOSIS — I361 Nonrheumatic tricuspid (valve) insufficiency: Secondary | ICD-10-CM | POA: Diagnosis not present

## 2018-12-22 DIAGNOSIS — R569 Unspecified convulsions: Secondary | ICD-10-CM | POA: Diagnosis not present

## 2018-12-22 DIAGNOSIS — Z7902 Long term (current) use of antithrombotics/antiplatelets: Secondary | ICD-10-CM | POA: Diagnosis not present

## 2018-12-22 DIAGNOSIS — I10 Essential (primary) hypertension: Secondary | ICD-10-CM | POA: Diagnosis not present

## 2018-12-22 DIAGNOSIS — I340 Nonrheumatic mitral (valve) insufficiency: Secondary | ICD-10-CM | POA: Diagnosis not present

## 2018-12-22 DIAGNOSIS — R079 Chest pain, unspecified: Secondary | ICD-10-CM | POA: Diagnosis not present

## 2018-12-22 DIAGNOSIS — E119 Type 2 diabetes mellitus without complications: Secondary | ICD-10-CM | POA: Diagnosis not present

## 2018-12-22 DIAGNOSIS — R0902 Hypoxemia: Secondary | ICD-10-CM | POA: Diagnosis not present

## 2018-12-22 DIAGNOSIS — N390 Urinary tract infection, site not specified: Secondary | ICD-10-CM | POA: Diagnosis not present

## 2018-12-22 DIAGNOSIS — E1122 Type 2 diabetes mellitus with diabetic chronic kidney disease: Secondary | ICD-10-CM | POA: Diagnosis not present

## 2018-12-22 DIAGNOSIS — I5041 Acute combined systolic (congestive) and diastolic (congestive) heart failure: Secondary | ICD-10-CM | POA: Diagnosis not present

## 2018-12-22 DIAGNOSIS — R531 Weakness: Secondary | ICD-10-CM | POA: Diagnosis not present

## 2018-12-22 DIAGNOSIS — R42 Dizziness and giddiness: Secondary | ICD-10-CM | POA: Diagnosis not present

## 2018-12-22 DIAGNOSIS — R55 Syncope and collapse: Secondary | ICD-10-CM | POA: Diagnosis not present

## 2018-12-22 DIAGNOSIS — L97518 Non-pressure chronic ulcer of other part of right foot with other specified severity: Secondary | ICD-10-CM | POA: Diagnosis not present

## 2018-12-22 DIAGNOSIS — I129 Hypertensive chronic kidney disease with stage 1 through stage 4 chronic kidney disease, or unspecified chronic kidney disease: Secondary | ICD-10-CM | POA: Diagnosis not present

## 2018-12-22 DIAGNOSIS — B952 Enterococcus as the cause of diseases classified elsewhere: Secondary | ICD-10-CM | POA: Diagnosis not present

## 2018-12-23 DIAGNOSIS — I361 Nonrheumatic tricuspid (valve) insufficiency: Secondary | ICD-10-CM

## 2018-12-23 DIAGNOSIS — I340 Nonrheumatic mitral (valve) insufficiency: Secondary | ICD-10-CM | POA: Diagnosis not present

## 2018-12-23 DIAGNOSIS — I10 Essential (primary) hypertension: Secondary | ICD-10-CM | POA: Diagnosis not present

## 2018-12-23 DIAGNOSIS — N183 Chronic kidney disease, stage 3 (moderate): Secondary | ICD-10-CM | POA: Diagnosis not present

## 2018-12-23 DIAGNOSIS — E871 Hypo-osmolality and hyponatremia: Secondary | ICD-10-CM | POA: Diagnosis not present

## 2018-12-23 DIAGNOSIS — I351 Nonrheumatic aortic (valve) insufficiency: Secondary | ICD-10-CM

## 2018-12-23 DIAGNOSIS — N390 Urinary tract infection, site not specified: Secondary | ICD-10-CM

## 2018-12-24 DIAGNOSIS — N390 Urinary tract infection, site not specified: Secondary | ICD-10-CM | POA: Diagnosis not present

## 2018-12-24 DIAGNOSIS — I10 Essential (primary) hypertension: Secondary | ICD-10-CM | POA: Diagnosis not present

## 2018-12-24 DIAGNOSIS — E871 Hypo-osmolality and hyponatremia: Secondary | ICD-10-CM | POA: Diagnosis not present

## 2018-12-24 DIAGNOSIS — N183 Chronic kidney disease, stage 3 (moderate): Secondary | ICD-10-CM | POA: Diagnosis not present

## 2018-12-25 DIAGNOSIS — E871 Hypo-osmolality and hyponatremia: Secondary | ICD-10-CM | POA: Diagnosis not present

## 2018-12-25 DIAGNOSIS — N390 Urinary tract infection, site not specified: Secondary | ICD-10-CM | POA: Diagnosis not present

## 2018-12-25 DIAGNOSIS — N183 Chronic kidney disease, stage 3 (moderate): Secondary | ICD-10-CM | POA: Diagnosis not present

## 2018-12-25 DIAGNOSIS — I10 Essential (primary) hypertension: Secondary | ICD-10-CM | POA: Diagnosis not present

## 2018-12-26 DIAGNOSIS — N390 Urinary tract infection, site not specified: Secondary | ICD-10-CM | POA: Diagnosis not present

## 2018-12-26 DIAGNOSIS — E871 Hypo-osmolality and hyponatremia: Secondary | ICD-10-CM | POA: Diagnosis not present

## 2018-12-26 DIAGNOSIS — I10 Essential (primary) hypertension: Secondary | ICD-10-CM | POA: Diagnosis not present

## 2018-12-26 DIAGNOSIS — N183 Chronic kidney disease, stage 3 (moderate): Secondary | ICD-10-CM | POA: Diagnosis not present

## 2018-12-28 DIAGNOSIS — R809 Proteinuria, unspecified: Secondary | ICD-10-CM | POA: Diagnosis not present

## 2018-12-28 DIAGNOSIS — I129 Hypertensive chronic kidney disease with stage 1 through stage 4 chronic kidney disease, or unspecified chronic kidney disease: Secondary | ICD-10-CM | POA: Diagnosis not present

## 2018-12-28 DIAGNOSIS — R339 Retention of urine, unspecified: Secondary | ICD-10-CM | POA: Diagnosis not present

## 2018-12-28 DIAGNOSIS — R001 Bradycardia, unspecified: Secondary | ICD-10-CM | POA: Diagnosis not present

## 2018-12-28 DIAGNOSIS — N390 Urinary tract infection, site not specified: Secondary | ICD-10-CM | POA: Diagnosis not present

## 2018-12-28 DIAGNOSIS — N184 Chronic kidney disease, stage 4 (severe): Secondary | ICD-10-CM | POA: Diagnosis not present

## 2018-12-28 DIAGNOSIS — E1122 Type 2 diabetes mellitus with diabetic chronic kidney disease: Secondary | ICD-10-CM | POA: Diagnosis not present

## 2018-12-28 DIAGNOSIS — E559 Vitamin D deficiency, unspecified: Secondary | ICD-10-CM | POA: Diagnosis not present

## 2018-12-28 DIAGNOSIS — I5022 Chronic systolic (congestive) heart failure: Secondary | ICD-10-CM | POA: Diagnosis not present

## 2018-12-29 ENCOUNTER — Telehealth: Payer: Self-pay | Admitting: Cardiology

## 2018-12-29 NOTE — Telephone Encounter (Signed)
Is scheduled for an echo here on 08/27 but had one at [facility] 08/07

## 2018-12-30 ENCOUNTER — Telehealth: Payer: Self-pay | Admitting: Cardiology

## 2018-12-30 NOTE — Telephone Encounter (Signed)
Dr. Bettina Gavia reviewed the echocardiogram report and advised the result is normal. Patient does not need repeat echo on 01/12/2019. Nevin Bloodgood will cancel this appointment. Will notify patient when she returns call.

## 2018-12-30 NOTE — Telephone Encounter (Signed)
Duplicate encounter. Please see previous encounter.  

## 2018-12-30 NOTE — Telephone Encounter (Signed)
Pulled echocardiogram done at [facility] on 12/23/2018 for Dr. Bettina Gavia to review. Left message for patient to return call.

## 2018-12-30 NOTE — Telephone Encounter (Signed)
Patient informed and is agreeable. No further questions.

## 2019-01-03 DIAGNOSIS — N183 Chronic kidney disease, stage 3 (moderate): Secondary | ICD-10-CM | POA: Diagnosis not present

## 2019-01-03 DIAGNOSIS — I129 Hypertensive chronic kidney disease with stage 1 through stage 4 chronic kidney disease, or unspecified chronic kidney disease: Secondary | ICD-10-CM | POA: Diagnosis not present

## 2019-01-03 DIAGNOSIS — R55 Syncope and collapse: Secondary | ICD-10-CM | POA: Diagnosis not present

## 2019-01-03 DIAGNOSIS — N390 Urinary tract infection, site not specified: Secondary | ICD-10-CM | POA: Diagnosis not present

## 2019-01-04 DIAGNOSIS — L97518 Non-pressure chronic ulcer of other part of right foot with other specified severity: Secondary | ICD-10-CM | POA: Diagnosis not present

## 2019-01-04 DIAGNOSIS — E1122 Type 2 diabetes mellitus with diabetic chronic kidney disease: Secondary | ICD-10-CM | POA: Diagnosis not present

## 2019-01-04 DIAGNOSIS — E11621 Type 2 diabetes mellitus with foot ulcer: Secondary | ICD-10-CM | POA: Diagnosis not present

## 2019-01-04 DIAGNOSIS — L97528 Non-pressure chronic ulcer of other part of left foot with other specified severity: Secondary | ICD-10-CM | POA: Diagnosis not present

## 2019-01-04 DIAGNOSIS — N183 Chronic kidney disease, stage 3 (moderate): Secondary | ICD-10-CM | POA: Diagnosis not present

## 2019-01-04 DIAGNOSIS — B952 Enterococcus as the cause of diseases classified elsewhere: Secondary | ICD-10-CM | POA: Diagnosis not present

## 2019-01-04 DIAGNOSIS — N390 Urinary tract infection, site not specified: Secondary | ICD-10-CM | POA: Diagnosis not present

## 2019-01-04 DIAGNOSIS — I5041 Acute combined systolic (congestive) and diastolic (congestive) heart failure: Secondary | ICD-10-CM | POA: Diagnosis not present

## 2019-01-04 DIAGNOSIS — I130 Hypertensive heart and chronic kidney disease with heart failure and stage 1 through stage 4 chronic kidney disease, or unspecified chronic kidney disease: Secondary | ICD-10-CM | POA: Diagnosis not present

## 2019-01-12 ENCOUNTER — Other Ambulatory Visit: Payer: Medicare PPO

## 2019-01-13 ENCOUNTER — Ambulatory Visit: Payer: Medicare PPO | Admitting: Cardiology

## 2019-01-13 DIAGNOSIS — L97528 Non-pressure chronic ulcer of other part of left foot with other specified severity: Secondary | ICD-10-CM | POA: Diagnosis not present

## 2019-01-13 DIAGNOSIS — N390 Urinary tract infection, site not specified: Secondary | ICD-10-CM | POA: Diagnosis not present

## 2019-01-13 DIAGNOSIS — E1122 Type 2 diabetes mellitus with diabetic chronic kidney disease: Secondary | ICD-10-CM | POA: Diagnosis not present

## 2019-01-13 DIAGNOSIS — I130 Hypertensive heart and chronic kidney disease with heart failure and stage 1 through stage 4 chronic kidney disease, or unspecified chronic kidney disease: Secondary | ICD-10-CM | POA: Diagnosis not present

## 2019-01-13 DIAGNOSIS — L97518 Non-pressure chronic ulcer of other part of right foot with other specified severity: Secondary | ICD-10-CM | POA: Diagnosis not present

## 2019-01-13 DIAGNOSIS — I5041 Acute combined systolic (congestive) and diastolic (congestive) heart failure: Secondary | ICD-10-CM | POA: Diagnosis not present

## 2019-01-13 DIAGNOSIS — N183 Chronic kidney disease, stage 3 (moderate): Secondary | ICD-10-CM | POA: Diagnosis not present

## 2019-01-13 DIAGNOSIS — E11621 Type 2 diabetes mellitus with foot ulcer: Secondary | ICD-10-CM | POA: Diagnosis not present

## 2019-01-13 DIAGNOSIS — B952 Enterococcus as the cause of diseases classified elsewhere: Secondary | ICD-10-CM | POA: Diagnosis not present

## 2019-01-18 DIAGNOSIS — B952 Enterococcus as the cause of diseases classified elsewhere: Secondary | ICD-10-CM | POA: Diagnosis not present

## 2019-01-18 DIAGNOSIS — I480 Paroxysmal atrial fibrillation: Secondary | ICD-10-CM | POA: Diagnosis not present

## 2019-01-18 DIAGNOSIS — E1122 Type 2 diabetes mellitus with diabetic chronic kidney disease: Secondary | ICD-10-CM | POA: Diagnosis not present

## 2019-01-18 DIAGNOSIS — I5041 Acute combined systolic (congestive) and diastolic (congestive) heart failure: Secondary | ICD-10-CM | POA: Diagnosis not present

## 2019-01-18 DIAGNOSIS — I4892 Unspecified atrial flutter: Secondary | ICD-10-CM | POA: Diagnosis not present

## 2019-01-18 DIAGNOSIS — E039 Hypothyroidism, unspecified: Secondary | ICD-10-CM | POA: Diagnosis not present

## 2019-01-18 DIAGNOSIS — I130 Hypertensive heart and chronic kidney disease with heart failure and stage 1 through stage 4 chronic kidney disease, or unspecified chronic kidney disease: Secondary | ICD-10-CM | POA: Diagnosis not present

## 2019-01-18 DIAGNOSIS — N183 Chronic kidney disease, stage 3 (moderate): Secondary | ICD-10-CM | POA: Diagnosis not present

## 2019-01-18 DIAGNOSIS — N390 Urinary tract infection, site not specified: Secondary | ICD-10-CM | POA: Diagnosis not present

## 2019-01-25 ENCOUNTER — Encounter: Payer: Self-pay | Admitting: Sports Medicine

## 2019-01-25 ENCOUNTER — Other Ambulatory Visit: Payer: Self-pay

## 2019-01-25 ENCOUNTER — Ambulatory Visit (INDEPENDENT_AMBULATORY_CARE_PROVIDER_SITE_OTHER): Payer: Medicare PPO | Admitting: Sports Medicine

## 2019-01-25 DIAGNOSIS — M79675 Pain in left toe(s): Secondary | ICD-10-CM

## 2019-01-25 DIAGNOSIS — E1140 Type 2 diabetes mellitus with diabetic neuropathy, unspecified: Secondary | ICD-10-CM | POA: Diagnosis not present

## 2019-01-25 DIAGNOSIS — M79674 Pain in right toe(s): Secondary | ICD-10-CM

## 2019-01-25 DIAGNOSIS — R339 Retention of urine, unspecified: Secondary | ICD-10-CM | POA: Diagnosis not present

## 2019-01-25 DIAGNOSIS — B351 Tinea unguium: Secondary | ICD-10-CM | POA: Diagnosis not present

## 2019-01-25 NOTE — Progress Notes (Signed)
Subjective: Maureen Stout is a 85 y.o. female patient with history of diabetes who returns to office today for diabetic nail care. Patient reports that she is doing ok. No sure what her last sugars are but did see the urologist today and have to continue with foley for 2 more weeks. Patient denies any other issues.   FBS not recorded.   Objective: General: Patient is awake, alert, and oriented x 3 and in no acute distress.  Integument: Skin is warm, dry and supple bilateral. Nails are elongated thickened and dystrophic with subungual debris, consistent with onychomycosis, 1-5 on right 1,2,4,5 on left. No signs of infection.    Vasculature:  Dorsalis Pedis pulse 1/4 bilateral. Posterior Tibial pulse  1/4 bilateral. Capillary fill time <3 sec 1-5 bilateral. Scant hair growth to the level of the digits.Temperature gradient within normal limits. Mild varicosities present bilateral. No edema present bilateral.   Neurology: The patient has absent sensation measured with a 5.07/10g Semmes Weinstein Monofilament at all pedal sites bilateral. Vibratory sensation absent bilateral with tuning fork. No Babinski sign present bilateral.   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 Asymptomatic bunion and hammertoe pedal deformities noted bilateral. Muscular strength 5/5 in all lower extremity muscular groups bilateral without pain on range of motion. No tenderness with calf compression bilateral.  Assessment and plan Problem List Items Addressed This Visit    None    Visit Diagnoses    Pain due to onychomycosis of toenails of both feet    -  Primary   Type 2 diabetes, controlled, with neuropathy ([place])         -Examined patient. -Discussed and educated patient on diabetic foot care, especially with  regards to the vascular, neurological and musculoskeletal systems.  -Mechanically debrided all nails x9 using sterile nail nipper without incident and smooth with rotary bur  -Return to office in  10-12 weeks for nail care  -Patient advised to call the office if any problems or questions arise in the meantime.  Landis Martins, DPM

## 2019-01-26 DIAGNOSIS — I5041 Acute combined systolic (congestive) and diastolic (congestive) heart failure: Secondary | ICD-10-CM | POA: Diagnosis not present

## 2019-01-26 DIAGNOSIS — E039 Hypothyroidism, unspecified: Secondary | ICD-10-CM | POA: Diagnosis not present

## 2019-01-26 DIAGNOSIS — I480 Paroxysmal atrial fibrillation: Secondary | ICD-10-CM | POA: Diagnosis not present

## 2019-01-26 DIAGNOSIS — N183 Chronic kidney disease, stage 3 (moderate): Secondary | ICD-10-CM | POA: Diagnosis not present

## 2019-01-26 DIAGNOSIS — I4892 Unspecified atrial flutter: Secondary | ICD-10-CM | POA: Diagnosis not present

## 2019-01-26 DIAGNOSIS — N390 Urinary tract infection, site not specified: Secondary | ICD-10-CM | POA: Diagnosis not present

## 2019-01-26 DIAGNOSIS — B952 Enterococcus as the cause of diseases classified elsewhere: Secondary | ICD-10-CM | POA: Diagnosis not present

## 2019-01-26 DIAGNOSIS — E1122 Type 2 diabetes mellitus with diabetic chronic kidney disease: Secondary | ICD-10-CM | POA: Diagnosis not present

## 2019-01-26 DIAGNOSIS — I130 Hypertensive heart and chronic kidney disease with heart failure and stage 1 through stage 4 chronic kidney disease, or unspecified chronic kidney disease: Secondary | ICD-10-CM | POA: Diagnosis not present

## 2019-01-31 ENCOUNTER — Ambulatory Visit: Payer: Medicare PPO | Admitting: Physical Medicine & Rehabilitation

## 2019-02-02 DIAGNOSIS — I480 Paroxysmal atrial fibrillation: Secondary | ICD-10-CM | POA: Diagnosis not present

## 2019-02-02 DIAGNOSIS — N390 Urinary tract infection, site not specified: Secondary | ICD-10-CM | POA: Diagnosis not present

## 2019-02-02 DIAGNOSIS — I5041 Acute combined systolic (congestive) and diastolic (congestive) heart failure: Secondary | ICD-10-CM | POA: Diagnosis not present

## 2019-02-02 DIAGNOSIS — N183 Chronic kidney disease, stage 3 (moderate): Secondary | ICD-10-CM | POA: Diagnosis not present

## 2019-02-02 DIAGNOSIS — E1122 Type 2 diabetes mellitus with diabetic chronic kidney disease: Secondary | ICD-10-CM | POA: Diagnosis not present

## 2019-02-02 DIAGNOSIS — I130 Hypertensive heart and chronic kidney disease with heart failure and stage 1 through stage 4 chronic kidney disease, or unspecified chronic kidney disease: Secondary | ICD-10-CM | POA: Diagnosis not present

## 2019-02-02 DIAGNOSIS — I4892 Unspecified atrial flutter: Secondary | ICD-10-CM | POA: Diagnosis not present

## 2019-02-02 DIAGNOSIS — E039 Hypothyroidism, unspecified: Secondary | ICD-10-CM | POA: Diagnosis not present

## 2019-02-02 DIAGNOSIS — B952 Enterococcus as the cause of diseases classified elsewhere: Secondary | ICD-10-CM | POA: Diagnosis not present

## 2019-02-07 DIAGNOSIS — G8918 Other acute postprocedural pain: Secondary | ICD-10-CM | POA: Diagnosis not present

## 2019-02-07 DIAGNOSIS — W19XXXA Unspecified fall, initial encounter: Secondary | ICD-10-CM | POA: Diagnosis not present

## 2019-02-07 DIAGNOSIS — Z4789 Encounter for other orthopedic aftercare: Secondary | ICD-10-CM | POA: Diagnosis not present

## 2019-02-07 DIAGNOSIS — I4891 Unspecified atrial fibrillation: Secondary | ICD-10-CM | POA: Diagnosis not present

## 2019-02-07 DIAGNOSIS — D649 Anemia, unspecified: Secondary | ICD-10-CM | POA: Diagnosis not present

## 2019-02-07 DIAGNOSIS — I10 Essential (primary) hypertension: Secondary | ICD-10-CM | POA: Diagnosis not present

## 2019-02-07 DIAGNOSIS — S8992XA Unspecified injury of left lower leg, initial encounter: Secondary | ICD-10-CM | POA: Diagnosis not present

## 2019-02-07 DIAGNOSIS — I959 Hypotension, unspecified: Secondary | ICD-10-CM | POA: Diagnosis not present

## 2019-02-07 DIAGNOSIS — N184 Chronic kidney disease, stage 4 (severe): Secondary | ICD-10-CM | POA: Diagnosis not present

## 2019-02-07 DIAGNOSIS — Z471 Aftercare following joint replacement surgery: Secondary | ICD-10-CM | POA: Diagnosis not present

## 2019-02-07 DIAGNOSIS — M2550 Pain in unspecified joint: Secondary | ICD-10-CM | POA: Diagnosis not present

## 2019-02-07 DIAGNOSIS — S72042A Displaced fracture of base of neck of left femur, initial encounter for closed fracture: Secondary | ICD-10-CM | POA: Diagnosis not present

## 2019-02-07 DIAGNOSIS — N390 Urinary tract infection, site not specified: Secondary | ICD-10-CM | POA: Diagnosis not present

## 2019-02-07 DIAGNOSIS — M1612 Unilateral primary osteoarthritis, left hip: Secondary | ICD-10-CM | POA: Diagnosis not present

## 2019-02-07 DIAGNOSIS — R262 Difficulty in walking, not elsewhere classified: Secondary | ICD-10-CM | POA: Diagnosis not present

## 2019-02-07 DIAGNOSIS — E042 Nontoxic multinodular goiter: Secondary | ICD-10-CM | POA: Diagnosis not present

## 2019-02-07 DIAGNOSIS — S72002D Fracture of unspecified part of neck of left femur, subsequent encounter for closed fracture with routine healing: Secondary | ICD-10-CM | POA: Diagnosis not present

## 2019-02-07 DIAGNOSIS — R55 Syncope and collapse: Secondary | ICD-10-CM | POA: Diagnosis not present

## 2019-02-07 DIAGNOSIS — S72002A Fracture of unspecified part of neck of left femur, initial encounter for closed fracture: Secondary | ICD-10-CM | POA: Diagnosis not present

## 2019-02-07 DIAGNOSIS — S0990XA Unspecified injury of head, initial encounter: Secondary | ICD-10-CM | POA: Diagnosis not present

## 2019-02-07 DIAGNOSIS — I129 Hypertensive chronic kidney disease with stage 1 through stage 4 chronic kidney disease, or unspecified chronic kidney disease: Secondary | ICD-10-CM | POA: Diagnosis not present

## 2019-02-07 DIAGNOSIS — E041 Nontoxic single thyroid nodule: Secondary | ICD-10-CM | POA: Diagnosis not present

## 2019-02-07 DIAGNOSIS — E039 Hypothyroidism, unspecified: Secondary | ICD-10-CM | POA: Diagnosis not present

## 2019-02-07 DIAGNOSIS — R112 Nausea with vomiting, unspecified: Secondary | ICD-10-CM | POA: Diagnosis not present

## 2019-02-07 DIAGNOSIS — E871 Hypo-osmolality and hyponatremia: Secondary | ICD-10-CM | POA: Diagnosis not present

## 2019-02-07 DIAGNOSIS — Z96642 Presence of left artificial hip joint: Secondary | ICD-10-CM | POA: Diagnosis not present

## 2019-02-07 DIAGNOSIS — Z7401 Bed confinement status: Secondary | ICD-10-CM | POA: Diagnosis not present

## 2019-02-07 DIAGNOSIS — S199XXA Unspecified injury of neck, initial encounter: Secondary | ICD-10-CM | POA: Diagnosis not present

## 2019-02-07 DIAGNOSIS — E43 Unspecified severe protein-calorie malnutrition: Secondary | ICD-10-CM | POA: Diagnosis not present

## 2019-02-07 DIAGNOSIS — S299XXA Unspecified injury of thorax, initial encounter: Secondary | ICD-10-CM | POA: Diagnosis not present

## 2019-02-07 DIAGNOSIS — D62 Acute posthemorrhagic anemia: Secondary | ICD-10-CM | POA: Diagnosis not present

## 2019-02-07 DIAGNOSIS — N183 Chronic kidney disease, stage 3 unspecified: Secondary | ICD-10-CM | POA: Diagnosis not present

## 2019-02-07 DIAGNOSIS — Z7902 Long term (current) use of antithrombotics/antiplatelets: Secondary | ICD-10-CM | POA: Diagnosis not present

## 2019-02-07 DIAGNOSIS — M84652A Pathological fracture in other disease, left femur, initial encounter for fracture: Secondary | ICD-10-CM | POA: Diagnosis not present

## 2019-02-08 DIAGNOSIS — E871 Hypo-osmolality and hyponatremia: Secondary | ICD-10-CM | POA: Diagnosis not present

## 2019-02-08 DIAGNOSIS — R55 Syncope and collapse: Secondary | ICD-10-CM | POA: Diagnosis not present

## 2019-02-08 DIAGNOSIS — S72002A Fracture of unspecified part of neck of left femur, initial encounter for closed fracture: Secondary | ICD-10-CM | POA: Diagnosis not present

## 2019-02-08 DIAGNOSIS — I10 Essential (primary) hypertension: Secondary | ICD-10-CM | POA: Diagnosis not present

## 2019-02-09 DIAGNOSIS — R55 Syncope and collapse: Secondary | ICD-10-CM | POA: Diagnosis not present

## 2019-02-09 DIAGNOSIS — S72002A Fracture of unspecified part of neck of left femur, initial encounter for closed fracture: Secondary | ICD-10-CM | POA: Diagnosis not present

## 2019-02-09 DIAGNOSIS — I10 Essential (primary) hypertension: Secondary | ICD-10-CM | POA: Diagnosis not present

## 2019-02-09 DIAGNOSIS — E871 Hypo-osmolality and hyponatremia: Secondary | ICD-10-CM | POA: Diagnosis not present

## 2019-02-10 DIAGNOSIS — R55 Syncope and collapse: Secondary | ICD-10-CM | POA: Diagnosis not present

## 2019-02-10 DIAGNOSIS — E871 Hypo-osmolality and hyponatremia: Secondary | ICD-10-CM | POA: Diagnosis not present

## 2019-02-10 DIAGNOSIS — I10 Essential (primary) hypertension: Secondary | ICD-10-CM | POA: Diagnosis not present

## 2019-02-10 DIAGNOSIS — S72002A Fracture of unspecified part of neck of left femur, initial encounter for closed fracture: Secondary | ICD-10-CM | POA: Diagnosis not present

## 2019-02-11 DIAGNOSIS — I10 Essential (primary) hypertension: Secondary | ICD-10-CM | POA: Diagnosis not present

## 2019-02-11 DIAGNOSIS — E871 Hypo-osmolality and hyponatremia: Secondary | ICD-10-CM | POA: Diagnosis not present

## 2019-02-11 DIAGNOSIS — S72002A Fracture of unspecified part of neck of left femur, initial encounter for closed fracture: Secondary | ICD-10-CM | POA: Diagnosis not present

## 2019-02-11 DIAGNOSIS — R55 Syncope and collapse: Secondary | ICD-10-CM | POA: Diagnosis not present

## 2019-02-12 DIAGNOSIS — E871 Hypo-osmolality and hyponatremia: Secondary | ICD-10-CM | POA: Diagnosis not present

## 2019-02-12 DIAGNOSIS — S72002A Fracture of unspecified part of neck of left femur, initial encounter for closed fracture: Secondary | ICD-10-CM | POA: Diagnosis not present

## 2019-02-12 DIAGNOSIS — R55 Syncope and collapse: Secondary | ICD-10-CM | POA: Diagnosis not present

## 2019-02-12 DIAGNOSIS — I10 Essential (primary) hypertension: Secondary | ICD-10-CM | POA: Diagnosis not present

## 2019-02-13 DIAGNOSIS — E871 Hypo-osmolality and hyponatremia: Secondary | ICD-10-CM | POA: Diagnosis not present

## 2019-02-13 DIAGNOSIS — S72002A Fracture of unspecified part of neck of left femur, initial encounter for closed fracture: Secondary | ICD-10-CM | POA: Diagnosis not present

## 2019-02-13 DIAGNOSIS — I10 Essential (primary) hypertension: Secondary | ICD-10-CM | POA: Diagnosis not present

## 2019-02-13 DIAGNOSIS — R55 Syncope and collapse: Secondary | ICD-10-CM | POA: Diagnosis not present

## 2019-02-14 DIAGNOSIS — S72002A Fracture of unspecified part of neck of left femur, initial encounter for closed fracture: Secondary | ICD-10-CM | POA: Diagnosis not present

## 2019-02-14 DIAGNOSIS — E871 Hypo-osmolality and hyponatremia: Secondary | ICD-10-CM | POA: Diagnosis not present

## 2019-02-14 DIAGNOSIS — I10 Essential (primary) hypertension: Secondary | ICD-10-CM | POA: Diagnosis not present

## 2019-02-14 DIAGNOSIS — R55 Syncope and collapse: Secondary | ICD-10-CM | POA: Diagnosis not present

## 2019-02-15 DIAGNOSIS — S72002A Fracture of unspecified part of neck of left femur, initial encounter for closed fracture: Secondary | ICD-10-CM | POA: Diagnosis not present

## 2019-02-15 DIAGNOSIS — R55 Syncope and collapse: Secondary | ICD-10-CM | POA: Diagnosis not present

## 2019-02-15 DIAGNOSIS — I10 Essential (primary) hypertension: Secondary | ICD-10-CM | POA: Diagnosis not present

## 2019-02-15 DIAGNOSIS — E871 Hypo-osmolality and hyponatremia: Secondary | ICD-10-CM | POA: Diagnosis not present

## 2019-02-16 DIAGNOSIS — S72002A Fracture of unspecified part of neck of left femur, initial encounter for closed fracture: Secondary | ICD-10-CM | POA: Diagnosis not present

## 2019-02-16 DIAGNOSIS — R55 Syncope and collapse: Secondary | ICD-10-CM | POA: Diagnosis not present

## 2019-02-16 DIAGNOSIS — I10 Essential (primary) hypertension: Secondary | ICD-10-CM | POA: Diagnosis not present

## 2019-02-16 DIAGNOSIS — E871 Hypo-osmolality and hyponatremia: Secondary | ICD-10-CM | POA: Diagnosis not present

## 2019-02-17 DIAGNOSIS — I10 Essential (primary) hypertension: Secondary | ICD-10-CM | POA: Diagnosis not present

## 2019-02-17 DIAGNOSIS — R1111 Vomiting without nausea: Secondary | ICD-10-CM | POA: Diagnosis not present

## 2019-02-17 DIAGNOSIS — S72002A Fracture of unspecified part of neck of left femur, initial encounter for closed fracture: Secondary | ICD-10-CM | POA: Diagnosis not present

## 2019-02-17 DIAGNOSIS — G8918 Other acute postprocedural pain: Secondary | ICD-10-CM | POA: Diagnosis not present

## 2019-02-17 DIAGNOSIS — M2550 Pain in unspecified joint: Secondary | ICD-10-CM | POA: Diagnosis not present

## 2019-02-17 DIAGNOSIS — T83518A Infection and inflammatory reaction due to other urinary catheter, initial encounter: Secondary | ICD-10-CM | POA: Diagnosis not present

## 2019-02-17 DIAGNOSIS — R319 Hematuria, unspecified: Secondary | ICD-10-CM | POA: Diagnosis not present

## 2019-02-17 DIAGNOSIS — Z471 Aftercare following joint replacement surgery: Secondary | ICD-10-CM | POA: Diagnosis not present

## 2019-02-17 DIAGNOSIS — N183 Chronic kidney disease, stage 3 unspecified: Secondary | ICD-10-CM | POA: Diagnosis not present

## 2019-02-17 DIAGNOSIS — E041 Nontoxic single thyroid nodule: Secondary | ICD-10-CM | POA: Diagnosis not present

## 2019-02-17 DIAGNOSIS — I129 Hypertensive chronic kidney disease with stage 1 through stage 4 chronic kidney disease, or unspecified chronic kidney disease: Secondary | ICD-10-CM | POA: Diagnosis not present

## 2019-02-17 DIAGNOSIS — S72002D Fracture of unspecified part of neck of left femur, subsequent encounter for closed fracture with routine healing: Secondary | ICD-10-CM | POA: Diagnosis not present

## 2019-02-17 DIAGNOSIS — Z4789 Encounter for other orthopedic aftercare: Secondary | ICD-10-CM | POA: Diagnosis not present

## 2019-02-17 DIAGNOSIS — R55 Syncope and collapse: Secondary | ICD-10-CM | POA: Diagnosis not present

## 2019-02-17 DIAGNOSIS — D649 Anemia, unspecified: Secondary | ICD-10-CM | POA: Diagnosis not present

## 2019-02-17 DIAGNOSIS — Z23 Encounter for immunization: Secondary | ICD-10-CM | POA: Diagnosis not present

## 2019-02-17 DIAGNOSIS — E46 Unspecified protein-calorie malnutrition: Secondary | ICD-10-CM | POA: Diagnosis not present

## 2019-02-17 DIAGNOSIS — B9689 Other specified bacterial agents as the cause of diseases classified elsewhere: Secondary | ICD-10-CM | POA: Diagnosis not present

## 2019-02-17 DIAGNOSIS — E871 Hypo-osmolality and hyponatremia: Secondary | ICD-10-CM | POA: Diagnosis not present

## 2019-02-17 DIAGNOSIS — T83511A Infection and inflammatory reaction due to indwelling urethral catheter, initial encounter: Secondary | ICD-10-CM | POA: Diagnosis not present

## 2019-02-17 DIAGNOSIS — R4020 Unspecified coma: Secondary | ICD-10-CM | POA: Diagnosis not present

## 2019-02-17 DIAGNOSIS — Z79899 Other long term (current) drug therapy: Secondary | ICD-10-CM | POA: Diagnosis not present

## 2019-02-17 DIAGNOSIS — I959 Hypotension, unspecified: Secondary | ICD-10-CM | POA: Diagnosis not present

## 2019-02-17 DIAGNOSIS — S31819D Unspecified open wound of right buttock, subsequent encounter: Secondary | ICD-10-CM | POA: Diagnosis not present

## 2019-02-17 DIAGNOSIS — R0902 Hypoxemia: Secondary | ICD-10-CM | POA: Diagnosis not present

## 2019-02-17 DIAGNOSIS — R0602 Shortness of breath: Secondary | ICD-10-CM | POA: Diagnosis not present

## 2019-02-17 DIAGNOSIS — R112 Nausea with vomiting, unspecified: Secondary | ICD-10-CM | POA: Diagnosis not present

## 2019-02-17 DIAGNOSIS — R262 Difficulty in walking, not elsewhere classified: Secondary | ICD-10-CM | POA: Diagnosis not present

## 2019-02-17 DIAGNOSIS — E039 Hypothyroidism, unspecified: Secondary | ICD-10-CM | POA: Diagnosis not present

## 2019-02-17 DIAGNOSIS — N390 Urinary tract infection, site not specified: Secondary | ICD-10-CM | POA: Diagnosis not present

## 2019-02-17 DIAGNOSIS — S31819A Unspecified open wound of right buttock, initial encounter: Secondary | ICD-10-CM | POA: Diagnosis not present

## 2019-02-17 DIAGNOSIS — B952 Enterococcus as the cause of diseases classified elsewhere: Secondary | ICD-10-CM | POA: Diagnosis not present

## 2019-02-17 DIAGNOSIS — R531 Weakness: Secondary | ICD-10-CM | POA: Diagnosis not present

## 2019-02-17 DIAGNOSIS — Z20828 Contact with and (suspected) exposure to other viral communicable diseases: Secondary | ICD-10-CM | POA: Diagnosis not present

## 2019-02-17 DIAGNOSIS — Z96642 Presence of left artificial hip joint: Secondary | ICD-10-CM | POA: Diagnosis not present

## 2019-02-17 DIAGNOSIS — Z7401 Bed confinement status: Secondary | ICD-10-CM | POA: Diagnosis not present

## 2019-02-21 DIAGNOSIS — S31819A Unspecified open wound of right buttock, initial encounter: Secondary | ICD-10-CM | POA: Diagnosis not present

## 2019-02-28 DIAGNOSIS — S31819D Unspecified open wound of right buttock, subsequent encounter: Secondary | ICD-10-CM | POA: Diagnosis not present

## 2019-03-07 DIAGNOSIS — S31819D Unspecified open wound of right buttock, subsequent encounter: Secondary | ICD-10-CM | POA: Diagnosis not present

## 2019-03-08 DIAGNOSIS — Z23 Encounter for immunization: Secondary | ICD-10-CM | POA: Diagnosis not present

## 2019-03-08 DIAGNOSIS — E871 Hypo-osmolality and hyponatremia: Secondary | ICD-10-CM | POA: Diagnosis not present

## 2019-03-08 DIAGNOSIS — B952 Enterococcus as the cause of diseases classified elsewhere: Secondary | ICD-10-CM | POA: Diagnosis not present

## 2019-03-08 DIAGNOSIS — R112 Nausea with vomiting, unspecified: Secondary | ICD-10-CM | POA: Diagnosis not present

## 2019-03-08 DIAGNOSIS — T83511A Infection and inflammatory reaction due to indwelling urethral catheter, initial encounter: Secondary | ICD-10-CM | POA: Diagnosis not present

## 2019-03-08 DIAGNOSIS — R4020 Unspecified coma: Secondary | ICD-10-CM | POA: Diagnosis not present

## 2019-03-08 DIAGNOSIS — R531 Weakness: Secondary | ICD-10-CM | POA: Diagnosis not present

## 2019-03-08 DIAGNOSIS — N183 Chronic kidney disease, stage 3 unspecified: Secondary | ICD-10-CM | POA: Diagnosis not present

## 2019-03-08 DIAGNOSIS — E039 Hypothyroidism, unspecified: Secondary | ICD-10-CM | POA: Diagnosis not present

## 2019-03-08 DIAGNOSIS — K224 Dyskinesia of esophagus: Secondary | ICD-10-CM | POA: Diagnosis not present

## 2019-03-08 DIAGNOSIS — B9689 Other specified bacterial agents as the cause of diseases classified elsewhere: Secondary | ICD-10-CM | POA: Diagnosis not present

## 2019-03-08 DIAGNOSIS — E46 Unspecified protein-calorie malnutrition: Secondary | ICD-10-CM | POA: Diagnosis not present

## 2019-03-08 DIAGNOSIS — T83518A Infection and inflammatory reaction due to other urinary catheter, initial encounter: Secondary | ICD-10-CM | POA: Diagnosis not present

## 2019-03-08 DIAGNOSIS — R0902 Hypoxemia: Secondary | ICD-10-CM | POA: Diagnosis not present

## 2019-03-08 DIAGNOSIS — I129 Hypertensive chronic kidney disease with stage 1 through stage 4 chronic kidney disease, or unspecified chronic kidney disease: Secondary | ICD-10-CM | POA: Diagnosis not present

## 2019-03-08 DIAGNOSIS — I10 Essential (primary) hypertension: Secondary | ICD-10-CM | POA: Diagnosis not present

## 2019-03-08 DIAGNOSIS — R1111 Vomiting without nausea: Secondary | ICD-10-CM | POA: Diagnosis not present

## 2019-03-08 DIAGNOSIS — N390 Urinary tract infection, site not specified: Secondary | ICD-10-CM | POA: Diagnosis not present

## 2019-03-09 DIAGNOSIS — R112 Nausea with vomiting, unspecified: Secondary | ICD-10-CM | POA: Diagnosis not present

## 2019-03-09 DIAGNOSIS — I129 Hypertensive chronic kidney disease with stage 1 through stage 4 chronic kidney disease, or unspecified chronic kidney disease: Secondary | ICD-10-CM | POA: Diagnosis not present

## 2019-03-09 DIAGNOSIS — E871 Hypo-osmolality and hyponatremia: Secondary | ICD-10-CM | POA: Diagnosis not present

## 2019-03-09 DIAGNOSIS — N390 Urinary tract infection, site not specified: Secondary | ICD-10-CM | POA: Diagnosis not present

## 2019-03-09 DIAGNOSIS — E039 Hypothyroidism, unspecified: Secondary | ICD-10-CM | POA: Diagnosis not present

## 2019-03-10 DIAGNOSIS — E871 Hypo-osmolality and hyponatremia: Secondary | ICD-10-CM | POA: Diagnosis not present

## 2019-03-10 DIAGNOSIS — E039 Hypothyroidism, unspecified: Secondary | ICD-10-CM | POA: Diagnosis not present

## 2019-03-10 DIAGNOSIS — R112 Nausea with vomiting, unspecified: Secondary | ICD-10-CM | POA: Diagnosis not present

## 2019-03-10 DIAGNOSIS — N390 Urinary tract infection, site not specified: Secondary | ICD-10-CM | POA: Diagnosis not present

## 2019-03-11 DIAGNOSIS — N390 Urinary tract infection, site not specified: Secondary | ICD-10-CM | POA: Diagnosis not present

## 2019-03-11 DIAGNOSIS — R112 Nausea with vomiting, unspecified: Secondary | ICD-10-CM | POA: Diagnosis not present

## 2019-03-11 DIAGNOSIS — E871 Hypo-osmolality and hyponatremia: Secondary | ICD-10-CM | POA: Diagnosis not present

## 2019-03-11 DIAGNOSIS — E039 Hypothyroidism, unspecified: Secondary | ICD-10-CM | POA: Diagnosis not present

## 2019-03-12 DIAGNOSIS — I5041 Acute combined systolic (congestive) and diastolic (congestive) heart failure: Secondary | ICD-10-CM | POA: Diagnosis not present

## 2019-03-12 DIAGNOSIS — I130 Hypertensive heart and chronic kidney disease with heart failure and stage 1 through stage 4 chronic kidney disease, or unspecified chronic kidney disease: Secondary | ICD-10-CM | POA: Diagnosis not present

## 2019-03-12 DIAGNOSIS — I480 Paroxysmal atrial fibrillation: Secondary | ICD-10-CM | POA: Diagnosis not present

## 2019-03-12 DIAGNOSIS — N184 Chronic kidney disease, stage 4 (severe): Secondary | ICD-10-CM | POA: Diagnosis not present

## 2019-03-12 DIAGNOSIS — S72002D Fracture of unspecified part of neck of left femur, subsequent encounter for closed fracture with routine healing: Secondary | ICD-10-CM | POA: Diagnosis not present

## 2019-03-12 DIAGNOSIS — I4892 Unspecified atrial flutter: Secondary | ICD-10-CM | POA: Diagnosis not present

## 2019-03-12 DIAGNOSIS — E1122 Type 2 diabetes mellitus with diabetic chronic kidney disease: Secondary | ICD-10-CM | POA: Diagnosis not present

## 2019-03-12 DIAGNOSIS — N390 Urinary tract infection, site not specified: Secondary | ICD-10-CM | POA: Diagnosis not present

## 2019-03-12 DIAGNOSIS — D631 Anemia in chronic kidney disease: Secondary | ICD-10-CM | POA: Diagnosis not present

## 2019-03-12 NOTE — Progress Notes (Deleted)
Cardiology Office Note:    Date:  03/12/2019   ID:  SHANNELL MIKKELSEN, DOB 1932-02-17, MRN [id number]  PCP:  Nicoletta Dress, MD  Cardiologist:  Shirlee More, MD    Referring MD: Nicoletta Dress, MD    ASSESSMENT:    1. Paroxysmal atrial fibrillation (HCC)   2. On amiodarone therapy   3. Chronic anticoagulation   4. Hypertensive heart and chronic kidney disease with heart failure and stage 1 through stage 4 chronic kidney disease, or chronic kidney disease ([place])   5. Chronic combined systolic and diastolic heart failure (HCC)   6. Stage 3b chronic kidney disease    PLAN:    In order of problems listed above:  1. ***   Next appointment: ***   Medication Adjustments/Labs and Tests Ordered: Current medicines are reviewed at length with the patient today.  Concerns regarding medicines are outlined above.  No orders of the defined types were placed in this encounter.  No orders of the defined types were placed in this encounter.   No chief complaint on file.   History of Present Illness:    CELA NEWCOM is a 85 y.o. female with a hx of  atrial fibrillation hypertension dyslipidemia chronic anticoagulation    last seen 11/25/2018.She was admitted to  [facility] in May 2020 with heart failure and stage IV CKD.  Echocardiogram performed 09/29/2018 showed an ejection fraction of 30 to 35% mildly dilated ventricle and filling pattern with pseudo-normalized consistent with decompensated heart failure.  There is mild right ventricular dysfunction mild left and right atrial enlargement she had a large left pleural effusion and a trivial pericardial effusion.  Aortic valve was calcified with mild aortic regurgitation and no stenosis.  Her previous ejection fraction 04/29/2017 was normal 60 to 65%.  Her admission to the hospital was preceded by an admission with Enterobacter sepsis complicated with delirium pancreatitis and anemia requiring transfusion.  She was also found to  have severe pulmonary artery hypertension.  She maintained sinus rhythm throughout that hospitalization and had bradycardia causing discontinuation of the beta-blocker   Compliance with diet, lifestyle and medications: *** Past Medical History:  Diagnosis Date   Acute on chronic anemia 09/07/2018   Bladder problem    CKD (chronic kidney disease), stage III (HCC)    Diabetes mellitus (HCC)    Dyslipidemia    Hypertension    Paroxysmal A-fib (HCC)    Paroxysmal atrial flutter (HCC)    Sinus bradycardia     Past Surgical History:  Procedure Laterality Date   PARTIAL HYSTERECTOMY     TOE SURGERY     TONSILLECTOMY      Current Medications: No outpatient medications have been marked as taking for the 03/14/19 encounter (Appointment) with Richardo Priest, MD.     Allergies:   Vancomycin   Social History   Socioeconomic History   Marital status: Married    Spouse name: Not on file   Number of children: Not on file   Years of education: Not on file   Highest education level: Not on file  Occupational History   Not on file  Social Needs   Financial resource strain: Not on file   Food insecurity    Worry: Not on file    Inability: Not on file   Transportation needs    Medical: Not on file    Non-medical: Not on file  Tobacco Use   Smoking status: Never Smoker   Smokeless tobacco:  Never Used  Substance and Sexual Activity   Alcohol use: No   Drug use: No   Sexual activity: Not on file  Lifestyle   Physical activity    Days per week: Not on file    Minutes per session: Not on file   Stress: Not on file  Relationships   Social connections    Talks on phone: Not on file    Gets together: Not on file    Attends religious service: Not on file    Active member of club or organization: Not on file    Attends meetings of clubs or organizations: Not on file    Relationship status: Not on file  Other Topics Concern   Not on file  Social  History Narrative   Not on file     Family History: The patient's ***family history includes Breast cancer in her sister; Cancer in her father. There is no history of Diabetes or Heart attack. ROS:   Please see the history of present illness.    All other systems reviewed and are negative.  EKGs/Labs/Other Studies Reviewed:    The following studies were reviewed today:  EKG:  EKG ordered today and personally reviewed.  The ekg ordered today demonstrates ***  Recent Labs: 09/24/2018: Magnesium 1.3 09/28/2018: B Natriuretic Peptide 241.8 11/25/2018: ALT 32; BUN 15; Creatinine, Ser 1.39; Hemoglobin 11.4; NT-Pro BNP 2,176; Platelets 255; Potassium 4.0; Sodium 133; TSH 1.130  Recent Lipid Panel    Component Value Date/Time   CHOL 81 09/08/2018 0529   TRIG 33 09/08/2018 0529   HDL 37 (L) 09/08/2018 0529   CHOLHDL 2.2 09/08/2018 0529   VLDL 7 09/08/2018 0529   LDLCALC 37 09/08/2018 0529    Physical Exam:    VS:  There were no vitals taken for this visit.    Wt Readings from Last 3 Encounters:  11/30/18 155 lb (70.3 kg)  11/25/18 156 lb 3.2 oz (70.9 kg)  10/19/18 159 lb (72.1 kg)     GEN: *** Well nourished, well developed in no acute distress HEENT: Normal NECK: No JVD; No carotid bruits LYMPHATICS: No lymphadenopathy CARDIAC: ***RRR, no murmurs, rubs, gallops RESPIRATORY:  Clear to auscultation without rales, wheezing or rhonchi  ABDOMEN: Soft, non-tender, non-distended MUSCULOSKELETAL:  No edema; No deformity  SKIN: Warm and dry NEUROLOGIC:  Alert and oriented x 3 PSYCHIATRIC:  Normal affect    Signed, Shirlee More, MD  03/12/2019 2:04 PM    [place]

## 2019-03-14 ENCOUNTER — Ambulatory Visit: Payer: Medicare PPO | Admitting: Cardiology

## 2019-03-14 DIAGNOSIS — S72002D Fracture of unspecified part of neck of left femur, subsequent encounter for closed fracture with routine healing: Secondary | ICD-10-CM | POA: Diagnosis not present

## 2019-03-14 DIAGNOSIS — N184 Chronic kidney disease, stage 4 (severe): Secondary | ICD-10-CM | POA: Diagnosis not present

## 2019-03-14 DIAGNOSIS — D631 Anemia in chronic kidney disease: Secondary | ICD-10-CM | POA: Diagnosis not present

## 2019-03-14 DIAGNOSIS — E1122 Type 2 diabetes mellitus with diabetic chronic kidney disease: Secondary | ICD-10-CM | POA: Diagnosis not present

## 2019-03-14 DIAGNOSIS — I5041 Acute combined systolic (congestive) and diastolic (congestive) heart failure: Secondary | ICD-10-CM | POA: Diagnosis not present

## 2019-03-14 DIAGNOSIS — I480 Paroxysmal atrial fibrillation: Secondary | ICD-10-CM | POA: Diagnosis not present

## 2019-03-14 DIAGNOSIS — I4892 Unspecified atrial flutter: Secondary | ICD-10-CM | POA: Diagnosis not present

## 2019-03-14 DIAGNOSIS — I130 Hypertensive heart and chronic kidney disease with heart failure and stage 1 through stage 4 chronic kidney disease, or unspecified chronic kidney disease: Secondary | ICD-10-CM | POA: Diagnosis not present

## 2019-03-14 DIAGNOSIS — N390 Urinary tract infection, site not specified: Secondary | ICD-10-CM | POA: Diagnosis not present

## 2019-03-15 DIAGNOSIS — N390 Urinary tract infection, site not specified: Secondary | ICD-10-CM | POA: Diagnosis not present

## 2019-03-15 DIAGNOSIS — E871 Hypo-osmolality and hyponatremia: Secondary | ICD-10-CM | POA: Diagnosis not present

## 2019-03-15 DIAGNOSIS — B952 Enterococcus as the cause of diseases classified elsewhere: Secondary | ICD-10-CM | POA: Diagnosis not present

## 2019-03-15 DIAGNOSIS — R112 Nausea with vomiting, unspecified: Secondary | ICD-10-CM | POA: Diagnosis not present

## 2019-03-15 DIAGNOSIS — Z8781 Personal history of (healed) traumatic fracture: Secondary | ICD-10-CM | POA: Diagnosis not present

## 2019-03-16 DIAGNOSIS — D631 Anemia in chronic kidney disease: Secondary | ICD-10-CM | POA: Diagnosis not present

## 2019-03-16 DIAGNOSIS — S72002D Fracture of unspecified part of neck of left femur, subsequent encounter for closed fracture with routine healing: Secondary | ICD-10-CM | POA: Diagnosis not present

## 2019-03-16 DIAGNOSIS — N390 Urinary tract infection, site not specified: Secondary | ICD-10-CM | POA: Diagnosis not present

## 2019-03-16 DIAGNOSIS — I130 Hypertensive heart and chronic kidney disease with heart failure and stage 1 through stage 4 chronic kidney disease, or unspecified chronic kidney disease: Secondary | ICD-10-CM | POA: Diagnosis not present

## 2019-03-16 DIAGNOSIS — N184 Chronic kidney disease, stage 4 (severe): Secondary | ICD-10-CM | POA: Diagnosis not present

## 2019-03-16 DIAGNOSIS — E1122 Type 2 diabetes mellitus with diabetic chronic kidney disease: Secondary | ICD-10-CM | POA: Diagnosis not present

## 2019-03-16 DIAGNOSIS — I480 Paroxysmal atrial fibrillation: Secondary | ICD-10-CM | POA: Diagnosis not present

## 2019-03-16 DIAGNOSIS — I5041 Acute combined systolic (congestive) and diastolic (congestive) heart failure: Secondary | ICD-10-CM | POA: Diagnosis not present

## 2019-03-16 DIAGNOSIS — I4892 Unspecified atrial flutter: Secondary | ICD-10-CM | POA: Diagnosis not present

## 2019-03-17 DIAGNOSIS — R8271 Bacteriuria: Secondary | ICD-10-CM | POA: Diagnosis not present

## 2019-03-17 DIAGNOSIS — R339 Retention of urine, unspecified: Secondary | ICD-10-CM | POA: Diagnosis not present

## 2019-03-20 DIAGNOSIS — I4892 Unspecified atrial flutter: Secondary | ICD-10-CM | POA: Diagnosis not present

## 2019-03-20 DIAGNOSIS — I5041 Acute combined systolic (congestive) and diastolic (congestive) heart failure: Secondary | ICD-10-CM | POA: Diagnosis not present

## 2019-03-20 DIAGNOSIS — N184 Chronic kidney disease, stage 4 (severe): Secondary | ICD-10-CM | POA: Diagnosis not present

## 2019-03-20 DIAGNOSIS — D631 Anemia in chronic kidney disease: Secondary | ICD-10-CM | POA: Diagnosis not present

## 2019-03-20 DIAGNOSIS — I130 Hypertensive heart and chronic kidney disease with heart failure and stage 1 through stage 4 chronic kidney disease, or unspecified chronic kidney disease: Secondary | ICD-10-CM | POA: Diagnosis not present

## 2019-03-20 DIAGNOSIS — E1122 Type 2 diabetes mellitus with diabetic chronic kidney disease: Secondary | ICD-10-CM | POA: Diagnosis not present

## 2019-03-20 DIAGNOSIS — N390 Urinary tract infection, site not specified: Secondary | ICD-10-CM | POA: Diagnosis not present

## 2019-03-20 DIAGNOSIS — S72002D Fracture of unspecified part of neck of left femur, subsequent encounter for closed fracture with routine healing: Secondary | ICD-10-CM | POA: Diagnosis not present

## 2019-03-20 DIAGNOSIS — I480 Paroxysmal atrial fibrillation: Secondary | ICD-10-CM | POA: Diagnosis not present

## 2019-03-21 DIAGNOSIS — E1122 Type 2 diabetes mellitus with diabetic chronic kidney disease: Secondary | ICD-10-CM | POA: Diagnosis not present

## 2019-03-21 DIAGNOSIS — I5041 Acute combined systolic (congestive) and diastolic (congestive) heart failure: Secondary | ICD-10-CM | POA: Diagnosis not present

## 2019-03-21 DIAGNOSIS — D631 Anemia in chronic kidney disease: Secondary | ICD-10-CM | POA: Diagnosis not present

## 2019-03-21 DIAGNOSIS — I4892 Unspecified atrial flutter: Secondary | ICD-10-CM | POA: Diagnosis not present

## 2019-03-21 DIAGNOSIS — I480 Paroxysmal atrial fibrillation: Secondary | ICD-10-CM | POA: Diagnosis not present

## 2019-03-21 DIAGNOSIS — N184 Chronic kidney disease, stage 4 (severe): Secondary | ICD-10-CM | POA: Diagnosis not present

## 2019-03-21 DIAGNOSIS — S72002D Fracture of unspecified part of neck of left femur, subsequent encounter for closed fracture with routine healing: Secondary | ICD-10-CM | POA: Diagnosis not present

## 2019-03-21 DIAGNOSIS — I130 Hypertensive heart and chronic kidney disease with heart failure and stage 1 through stage 4 chronic kidney disease, or unspecified chronic kidney disease: Secondary | ICD-10-CM | POA: Diagnosis not present

## 2019-03-21 DIAGNOSIS — N390 Urinary tract infection, site not specified: Secondary | ICD-10-CM | POA: Diagnosis not present

## 2019-03-23 DIAGNOSIS — Z96642 Presence of left artificial hip joint: Secondary | ICD-10-CM | POA: Diagnosis not present

## 2019-03-23 DIAGNOSIS — S72009A Fracture of unspecified part of neck of unspecified femur, initial encounter for closed fracture: Secondary | ICD-10-CM | POA: Diagnosis not present

## 2019-03-27 DIAGNOSIS — E1122 Type 2 diabetes mellitus with diabetic chronic kidney disease: Secondary | ICD-10-CM | POA: Diagnosis not present

## 2019-03-27 DIAGNOSIS — N390 Urinary tract infection, site not specified: Secondary | ICD-10-CM | POA: Diagnosis not present

## 2019-03-27 DIAGNOSIS — I130 Hypertensive heart and chronic kidney disease with heart failure and stage 1 through stage 4 chronic kidney disease, or unspecified chronic kidney disease: Secondary | ICD-10-CM | POA: Diagnosis not present

## 2019-03-27 DIAGNOSIS — I4892 Unspecified atrial flutter: Secondary | ICD-10-CM | POA: Diagnosis not present

## 2019-03-27 DIAGNOSIS — S72002D Fracture of unspecified part of neck of left femur, subsequent encounter for closed fracture with routine healing: Secondary | ICD-10-CM | POA: Diagnosis not present

## 2019-03-27 DIAGNOSIS — I480 Paroxysmal atrial fibrillation: Secondary | ICD-10-CM | POA: Diagnosis not present

## 2019-03-27 DIAGNOSIS — N184 Chronic kidney disease, stage 4 (severe): Secondary | ICD-10-CM | POA: Diagnosis not present

## 2019-03-27 DIAGNOSIS — I5041 Acute combined systolic (congestive) and diastolic (congestive) heart failure: Secondary | ICD-10-CM | POA: Diagnosis not present

## 2019-03-27 DIAGNOSIS — D631 Anemia in chronic kidney disease: Secondary | ICD-10-CM | POA: Diagnosis not present

## 2019-03-28 DIAGNOSIS — I5041 Acute combined systolic (congestive) and diastolic (congestive) heart failure: Secondary | ICD-10-CM | POA: Diagnosis not present

## 2019-03-28 DIAGNOSIS — I480 Paroxysmal atrial fibrillation: Secondary | ICD-10-CM | POA: Diagnosis not present

## 2019-03-28 DIAGNOSIS — E1122 Type 2 diabetes mellitus with diabetic chronic kidney disease: Secondary | ICD-10-CM | POA: Diagnosis not present

## 2019-03-28 DIAGNOSIS — I4892 Unspecified atrial flutter: Secondary | ICD-10-CM | POA: Diagnosis not present

## 2019-03-28 DIAGNOSIS — D631 Anemia in chronic kidney disease: Secondary | ICD-10-CM | POA: Diagnosis not present

## 2019-03-28 DIAGNOSIS — S72002D Fracture of unspecified part of neck of left femur, subsequent encounter for closed fracture with routine healing: Secondary | ICD-10-CM | POA: Diagnosis not present

## 2019-03-28 DIAGNOSIS — I130 Hypertensive heart and chronic kidney disease with heart failure and stage 1 through stage 4 chronic kidney disease, or unspecified chronic kidney disease: Secondary | ICD-10-CM | POA: Diagnosis not present

## 2019-03-28 DIAGNOSIS — N390 Urinary tract infection, site not specified: Secondary | ICD-10-CM | POA: Diagnosis not present

## 2019-03-28 DIAGNOSIS — N184 Chronic kidney disease, stage 4 (severe): Secondary | ICD-10-CM | POA: Diagnosis not present

## 2019-03-29 DIAGNOSIS — N390 Urinary tract infection, site not specified: Secondary | ICD-10-CM | POA: Diagnosis not present

## 2019-03-29 DIAGNOSIS — R339 Retention of urine, unspecified: Secondary | ICD-10-CM | POA: Diagnosis not present

## 2019-03-29 DIAGNOSIS — R319 Hematuria, unspecified: Secondary | ICD-10-CM | POA: Diagnosis not present

## 2019-03-30 DIAGNOSIS — R55 Syncope and collapse: Secondary | ICD-10-CM | POA: Diagnosis not present

## 2019-03-30 DIAGNOSIS — R809 Proteinuria, unspecified: Secondary | ICD-10-CM | POA: Diagnosis not present

## 2019-03-30 DIAGNOSIS — I129 Hypertensive chronic kidney disease with stage 1 through stage 4 chronic kidney disease, or unspecified chronic kidney disease: Secondary | ICD-10-CM | POA: Diagnosis not present

## 2019-03-30 DIAGNOSIS — R339 Retention of urine, unspecified: Secondary | ICD-10-CM | POA: Diagnosis not present

## 2019-03-30 DIAGNOSIS — N1831 Chronic kidney disease, stage 3a: Secondary | ICD-10-CM | POA: Diagnosis not present

## 2019-03-30 DIAGNOSIS — I5022 Chronic systolic (congestive) heart failure: Secondary | ICD-10-CM | POA: Diagnosis not present

## 2019-03-30 DIAGNOSIS — E1122 Type 2 diabetes mellitus with diabetic chronic kidney disease: Secondary | ICD-10-CM | POA: Diagnosis not present

## 2019-04-03 DIAGNOSIS — D631 Anemia in chronic kidney disease: Secondary | ICD-10-CM | POA: Diagnosis not present

## 2019-04-03 DIAGNOSIS — I130 Hypertensive heart and chronic kidney disease with heart failure and stage 1 through stage 4 chronic kidney disease, or unspecified chronic kidney disease: Secondary | ICD-10-CM | POA: Diagnosis not present

## 2019-04-03 DIAGNOSIS — I4892 Unspecified atrial flutter: Secondary | ICD-10-CM | POA: Diagnosis not present

## 2019-04-03 DIAGNOSIS — E1122 Type 2 diabetes mellitus with diabetic chronic kidney disease: Secondary | ICD-10-CM | POA: Diagnosis not present

## 2019-04-03 DIAGNOSIS — I480 Paroxysmal atrial fibrillation: Secondary | ICD-10-CM | POA: Diagnosis not present

## 2019-04-03 DIAGNOSIS — I5041 Acute combined systolic (congestive) and diastolic (congestive) heart failure: Secondary | ICD-10-CM | POA: Diagnosis not present

## 2019-04-03 DIAGNOSIS — S72002D Fracture of unspecified part of neck of left femur, subsequent encounter for closed fracture with routine healing: Secondary | ICD-10-CM | POA: Diagnosis not present

## 2019-04-03 DIAGNOSIS — N184 Chronic kidney disease, stage 4 (severe): Secondary | ICD-10-CM | POA: Diagnosis not present

## 2019-04-03 DIAGNOSIS — N390 Urinary tract infection, site not specified: Secondary | ICD-10-CM | POA: Diagnosis not present

## 2019-04-04 DIAGNOSIS — N184 Chronic kidney disease, stage 4 (severe): Secondary | ICD-10-CM | POA: Diagnosis not present

## 2019-04-04 DIAGNOSIS — N390 Urinary tract infection, site not specified: Secondary | ICD-10-CM | POA: Diagnosis not present

## 2019-04-04 DIAGNOSIS — S72002D Fracture of unspecified part of neck of left femur, subsequent encounter for closed fracture with routine healing: Secondary | ICD-10-CM | POA: Diagnosis not present

## 2019-04-04 DIAGNOSIS — I130 Hypertensive heart and chronic kidney disease with heart failure and stage 1 through stage 4 chronic kidney disease, or unspecified chronic kidney disease: Secondary | ICD-10-CM | POA: Diagnosis not present

## 2019-04-04 DIAGNOSIS — I5041 Acute combined systolic (congestive) and diastolic (congestive) heart failure: Secondary | ICD-10-CM | POA: Diagnosis not present

## 2019-04-04 DIAGNOSIS — I480 Paroxysmal atrial fibrillation: Secondary | ICD-10-CM | POA: Diagnosis not present

## 2019-04-04 DIAGNOSIS — D631 Anemia in chronic kidney disease: Secondary | ICD-10-CM | POA: Diagnosis not present

## 2019-04-04 DIAGNOSIS — E1122 Type 2 diabetes mellitus with diabetic chronic kidney disease: Secondary | ICD-10-CM | POA: Diagnosis not present

## 2019-04-04 DIAGNOSIS — I4892 Unspecified atrial flutter: Secondary | ICD-10-CM | POA: Diagnosis not present

## 2019-04-05 DIAGNOSIS — R339 Retention of urine, unspecified: Secondary | ICD-10-CM | POA: Diagnosis not present

## 2019-04-05 DIAGNOSIS — R8271 Bacteriuria: Secondary | ICD-10-CM | POA: Diagnosis not present

## 2019-04-11 DIAGNOSIS — I4892 Unspecified atrial flutter: Secondary | ICD-10-CM | POA: Diagnosis not present

## 2019-04-11 DIAGNOSIS — I5041 Acute combined systolic (congestive) and diastolic (congestive) heart failure: Secondary | ICD-10-CM | POA: Diagnosis not present

## 2019-04-11 DIAGNOSIS — S72002D Fracture of unspecified part of neck of left femur, subsequent encounter for closed fracture with routine healing: Secondary | ICD-10-CM | POA: Diagnosis not present

## 2019-04-11 DIAGNOSIS — I480 Paroxysmal atrial fibrillation: Secondary | ICD-10-CM | POA: Diagnosis not present

## 2019-04-11 DIAGNOSIS — N390 Urinary tract infection, site not specified: Secondary | ICD-10-CM | POA: Diagnosis not present

## 2019-04-11 DIAGNOSIS — E1122 Type 2 diabetes mellitus with diabetic chronic kidney disease: Secondary | ICD-10-CM | POA: Diagnosis not present

## 2019-04-11 DIAGNOSIS — N184 Chronic kidney disease, stage 4 (severe): Secondary | ICD-10-CM | POA: Diagnosis not present

## 2019-04-11 DIAGNOSIS — I130 Hypertensive heart and chronic kidney disease with heart failure and stage 1 through stage 4 chronic kidney disease, or unspecified chronic kidney disease: Secondary | ICD-10-CM | POA: Diagnosis not present

## 2019-04-11 DIAGNOSIS — D631 Anemia in chronic kidney disease: Secondary | ICD-10-CM | POA: Diagnosis not present

## 2019-04-17 DIAGNOSIS — N390 Urinary tract infection, site not specified: Secondary | ICD-10-CM | POA: Diagnosis not present

## 2019-04-17 DIAGNOSIS — E1122 Type 2 diabetes mellitus with diabetic chronic kidney disease: Secondary | ICD-10-CM | POA: Diagnosis not present

## 2019-04-17 DIAGNOSIS — N184 Chronic kidney disease, stage 4 (severe): Secondary | ICD-10-CM | POA: Diagnosis not present

## 2019-04-17 DIAGNOSIS — I480 Paroxysmal atrial fibrillation: Secondary | ICD-10-CM | POA: Diagnosis not present

## 2019-04-17 DIAGNOSIS — I4892 Unspecified atrial flutter: Secondary | ICD-10-CM | POA: Diagnosis not present

## 2019-04-17 DIAGNOSIS — I5041 Acute combined systolic (congestive) and diastolic (congestive) heart failure: Secondary | ICD-10-CM | POA: Diagnosis not present

## 2019-04-17 DIAGNOSIS — I130 Hypertensive heart and chronic kidney disease with heart failure and stage 1 through stage 4 chronic kidney disease, or unspecified chronic kidney disease: Secondary | ICD-10-CM | POA: Diagnosis not present

## 2019-04-17 DIAGNOSIS — S72002D Fracture of unspecified part of neck of left femur, subsequent encounter for closed fracture with routine healing: Secondary | ICD-10-CM | POA: Diagnosis not present

## 2019-04-17 DIAGNOSIS — D631 Anemia in chronic kidney disease: Secondary | ICD-10-CM | POA: Diagnosis not present

## 2019-04-18 DIAGNOSIS — N390 Urinary tract infection, site not specified: Secondary | ICD-10-CM | POA: Diagnosis not present

## 2019-04-18 DIAGNOSIS — I4892 Unspecified atrial flutter: Secondary | ICD-10-CM | POA: Diagnosis not present

## 2019-04-18 DIAGNOSIS — E1122 Type 2 diabetes mellitus with diabetic chronic kidney disease: Secondary | ICD-10-CM | POA: Diagnosis not present

## 2019-04-18 DIAGNOSIS — I480 Paroxysmal atrial fibrillation: Secondary | ICD-10-CM | POA: Diagnosis not present

## 2019-04-18 DIAGNOSIS — D631 Anemia in chronic kidney disease: Secondary | ICD-10-CM | POA: Diagnosis not present

## 2019-04-18 DIAGNOSIS — S72002D Fracture of unspecified part of neck of left femur, subsequent encounter for closed fracture with routine healing: Secondary | ICD-10-CM | POA: Diagnosis not present

## 2019-04-18 DIAGNOSIS — I5041 Acute combined systolic (congestive) and diastolic (congestive) heart failure: Secondary | ICD-10-CM | POA: Diagnosis not present

## 2019-04-18 DIAGNOSIS — I130 Hypertensive heart and chronic kidney disease with heart failure and stage 1 through stage 4 chronic kidney disease, or unspecified chronic kidney disease: Secondary | ICD-10-CM | POA: Diagnosis not present

## 2019-04-18 DIAGNOSIS — N184 Chronic kidney disease, stage 4 (severe): Secondary | ICD-10-CM | POA: Diagnosis not present

## 2019-04-26 ENCOUNTER — Ambulatory Visit: Payer: Medicare PPO | Admitting: Sports Medicine

## 2019-04-26 DIAGNOSIS — R339 Retention of urine, unspecified: Secondary | ICD-10-CM | POA: Diagnosis not present

## 2019-04-27 DIAGNOSIS — S72002D Fracture of unspecified part of neck of left femur, subsequent encounter for closed fracture with routine healing: Secondary | ICD-10-CM | POA: Diagnosis not present

## 2019-04-27 DIAGNOSIS — I130 Hypertensive heart and chronic kidney disease with heart failure and stage 1 through stage 4 chronic kidney disease, or unspecified chronic kidney disease: Secondary | ICD-10-CM | POA: Diagnosis not present

## 2019-04-27 DIAGNOSIS — I480 Paroxysmal atrial fibrillation: Secondary | ICD-10-CM | POA: Diagnosis not present

## 2019-04-27 DIAGNOSIS — E1122 Type 2 diabetes mellitus with diabetic chronic kidney disease: Secondary | ICD-10-CM | POA: Diagnosis not present

## 2019-04-27 DIAGNOSIS — N390 Urinary tract infection, site not specified: Secondary | ICD-10-CM | POA: Diagnosis not present

## 2019-04-27 DIAGNOSIS — I5041 Acute combined systolic (congestive) and diastolic (congestive) heart failure: Secondary | ICD-10-CM | POA: Diagnosis not present

## 2019-04-27 DIAGNOSIS — I4892 Unspecified atrial flutter: Secondary | ICD-10-CM | POA: Diagnosis not present

## 2019-04-27 DIAGNOSIS — D631 Anemia in chronic kidney disease: Secondary | ICD-10-CM | POA: Diagnosis not present

## 2019-04-27 DIAGNOSIS — N184 Chronic kidney disease, stage 4 (severe): Secondary | ICD-10-CM | POA: Diagnosis not present

## 2019-05-02 DIAGNOSIS — D631 Anemia in chronic kidney disease: Secondary | ICD-10-CM | POA: Diagnosis not present

## 2019-05-02 DIAGNOSIS — I480 Paroxysmal atrial fibrillation: Secondary | ICD-10-CM | POA: Diagnosis not present

## 2019-05-02 DIAGNOSIS — I4892 Unspecified atrial flutter: Secondary | ICD-10-CM | POA: Diagnosis not present

## 2019-05-02 DIAGNOSIS — I5041 Acute combined systolic (congestive) and diastolic (congestive) heart failure: Secondary | ICD-10-CM | POA: Diagnosis not present

## 2019-05-02 DIAGNOSIS — S72002D Fracture of unspecified part of neck of left femur, subsequent encounter for closed fracture with routine healing: Secondary | ICD-10-CM | POA: Diagnosis not present

## 2019-05-02 DIAGNOSIS — E1122 Type 2 diabetes mellitus with diabetic chronic kidney disease: Secondary | ICD-10-CM | POA: Diagnosis not present

## 2019-05-02 DIAGNOSIS — N184 Chronic kidney disease, stage 4 (severe): Secondary | ICD-10-CM | POA: Diagnosis not present

## 2019-05-02 DIAGNOSIS — N390 Urinary tract infection, site not specified: Secondary | ICD-10-CM | POA: Diagnosis not present

## 2019-05-02 DIAGNOSIS — I130 Hypertensive heart and chronic kidney disease with heart failure and stage 1 through stage 4 chronic kidney disease, or unspecified chronic kidney disease: Secondary | ICD-10-CM | POA: Diagnosis not present

## 2019-05-04 ENCOUNTER — Encounter: Payer: Self-pay | Admitting: Sports Medicine

## 2019-05-04 ENCOUNTER — Ambulatory Visit (INDEPENDENT_AMBULATORY_CARE_PROVIDER_SITE_OTHER): Payer: Medicare PPO | Admitting: Sports Medicine

## 2019-05-04 ENCOUNTER — Other Ambulatory Visit: Payer: Self-pay

## 2019-05-04 DIAGNOSIS — M79675 Pain in left toe(s): Secondary | ICD-10-CM | POA: Diagnosis not present

## 2019-05-04 DIAGNOSIS — Z96642 Presence of left artificial hip joint: Secondary | ICD-10-CM | POA: Diagnosis not present

## 2019-05-04 DIAGNOSIS — E1140 Type 2 diabetes mellitus with diabetic neuropathy, unspecified: Secondary | ICD-10-CM | POA: Diagnosis not present

## 2019-05-04 DIAGNOSIS — M79674 Pain in right toe(s): Secondary | ICD-10-CM

## 2019-05-04 DIAGNOSIS — B351 Tinea unguium: Secondary | ICD-10-CM | POA: Diagnosis not present

## 2019-05-04 DIAGNOSIS — S72009A Fracture of unspecified part of neck of unspecified femur, initial encounter for closed fracture: Secondary | ICD-10-CM | POA: Diagnosis not present

## 2019-05-04 NOTE — Progress Notes (Signed)
Subjective: Maureen Stout is a 85 y.o. female patient with history of diabetes who returns to office today for diabetic nail care. Patient reports that she is doing good and had a hip replacement. Patient denies any other issues.   FBS not recorded. Does not have battery for meter  Objective: General: Patient is awake, alert, and oriented x 3 and in no acute distress.  Integument: Skin is warm, dry and supple bilateral. Nails are elongated thickened and dystrophic with subungual debris, consistent with onychomycosis, 1-5 on right 1,2,4,5 on left. No signs of infection.    Vasculature:  Dorsalis Pedis pulse 1/4 bilateral. Posterior Tibial pulse  1/4 bilateral. Capillary fill time <3 sec 1-5 bilateral. Scant hair growth to the level of the digits.Temperature gradient within normal limits. Mild varicosities present bilateral. No edema present bilateral.   Neurology: The patient has absent sensation measured with a 5.07/10g Semmes Weinstein Monofilament at all pedal sites bilateral. Vibratory sensation absent bilateral with tuning fork. No Babinski sign present bilateral.   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 Asymptomatic bunion and hammertoe pedal deformities noted bilateral. Muscular strength 5/5 in all lower extremity muscular groups bilateral without pain on range of motion. No tenderness with calf compression bilateral.  Assessment and plan Problem List Items Addressed This Visit    None    Visit Diagnoses    Pain due to onychomycosis of toenails of both feet    -  Primary   Relevant Medications   fluconazole (DIFLUCAN) 100 MG tablet   Type 2 diabetes, controlled, with neuropathy ([place])         -Examined patient. -Re-Discussed and educated patient on diabetic foot care, especially with  regards to the vascular, neurological and musculoskeletal systems.  -Mechanically debrided all nails x9 using sterile nail nipper without incident and smooth with rotary bur  -Return to  office in 10-12 weeks for nail care  -Patient advised to call the office if any problems or questions arise in the meantime.  Landis Martins, DPM

## 2019-05-15 NOTE — Progress Notes (Signed)
Cardiology Office Note:    Date:  05/16/2019   ID:  Maureen Stout, DOB [birth year], MRN [id number]  PCP:  Nicoletta Dress, MD  Cardiologist:  Shirlee More, MD    Referring MD: Nicoletta Dress, MD    ASSESSMENT:    1. Paroxysmal atrial fibrillation (HCC)   2. On amiodarone therapy   3. Chronic anticoagulation   4. Hypertensive heart disease with chronic combined systolic and diastolic congestive heart failure ([place])   5. Other cardiomyopathy ([place])    PLAN:    In order of problems listed above:  1. Stable continue amiodarone anticoagulant 2. Check CMP TSH for toxicity 3. Continue her current reduced dose anticoagulant with age renal function 4. Stable BP 136/80 repeat and continue current treatment including hydralazine and isosorbide 5. Improved on recent echocardiogram   Next appointment: 6 months   Medication Adjustments/Labs and Tests Ordered: Current medicines are reviewed at length with the patient today.  Concerns regarding medicines are outlined above.  No orders of the defined types were placed in this encounter.  No orders of the defined types were placed in this encounter.   No chief complaint on file.   History of Present Illness:    Maureen Stout is a 85 y.o. female with a hx of atrial fibrillation hypertension dyslipidemia chronic anticoagulation  last seen 11/25/2018.  She was admitted to  [facility] in May 2020 with heart failure and stage IV CKD. Echocardiogram performed 09/29/2018 showed an ejection fraction of 30 to 35% mildly dilated ventricle and filling pattern with pseudo-normalized consistent with decompensated heart failure. There is mild right ventricular dysfunction mild left and right atrial enlargement she had a large left pleural effusion and a trivial pericardial effusion.  Aortic valve was calcified with mild aortic regurgitation and no stenosis.  Her previous ejection fraction 04/29/2017 was normal 60 to 65%.  Her admission to  the hospital was preceded by an admission with Enterobacter sepsis complicated with delirium pancreatitis and anemia requiring transfusion.  She was also found to have severe pulmonary artery hypertension.  She maintained sinus rhythm throughout that hospitalization and had bradycardia causing discontinuation of the beta-blocker   Compliance with diet, lifestyle and medications: Yes I reviewed her echocardiogram with her  Her echocardiogram 08/07/ 2020 showed a marked improvement EF normal 50 to 55% right ventricular function normal and pulmonary artery systolic pressure was only mildly elevated.  She has never fully recovered after her hip fracture but is at home and lives independently.  She has had no edema shortness of breath orthopnea and is stopped taking her diuretic.  No shortness of breath chest pain or palpitation.  She is overdue for labs we will check in my office today including lipids CMP proBNP and TSH copy to Dr. Elissa Hefty  Past Medical History:  Diagnosis Date  . Acute on chronic anemia 09/07/2018  . Bladder problem   . CKD (chronic kidney disease), stage III   . Diabetes mellitus ([place])   . Dyslipidemia   . Hypertension   . Paroxysmal A-fib ([place])   . Paroxysmal atrial flutter ([place])   . Sinus bradycardia     Past Surgical History:  Procedure Laterality Date  . PARTIAL HYSTERECTOMY    . TOE SURGERY    . TONSILLECTOMY      Current Medications: Current Meds  Medication Sig  . amiodarone (PACERONE) 200 MG tablet Take 0.5 tablets (100 mg total) by mouth daily.  Marland Kitchen amoxicillin (AMOXIL) 500 MG capsule  Take 1 capsule (500 mg total) by mouth at bedtime.  Marland Kitchen apixaban (ELIQUIS) 2.5 MG TABS tablet Take 1 tablet (2.5 mg total) by mouth 2 (two) times daily.  Marland Kitchen atorvastatin (LIPITOR) 10 MG tablet Take 1 tablet (10 mg total) by mouth daily.  . Calcium Carbonate-Vitamin D (CALCIUM-D) 600-400 MG-UNIT TABS Take 1 tablet by mouth 2 (two) times daily.  . fluconazole (DIFLUCAN) 100 MG tablet  Take by mouth.  . furosemide (LASIX) 40 MG tablet Take 40 mg by mouth daily.   . hydrALAZINE (APRESOLINE) 25 MG tablet Take 0.5 tablets (12.5 mg total) by mouth 3 (three) times daily.  . isosorbide dinitrate (ISORDIL) 10 MG tablet Take 1 tablet (10 mg total) by mouth 3 (three) times daily.  Marland Kitchen levothyroxine (SYNTHROID) 75 MCG tablet Take 1 tablet (75 mcg total) by mouth daily before breakfast.  . Multiple Vitamin (MULTI-VITAMINS) TABS Take 1 tablet by mouth daily.   . polyethylene glycol (MIRALAX / GLYCOLAX) 17 g packet Take 17 g by mouth daily. For constipation.     Allergies:   Vancomycin   Social History   Socioeconomic History  . Marital status: Married    Spouse name: Not on file  . Number of children: Not on file  . Years of education: Not on file  . Highest education level: Not on file  Occupational History  . Not on file  Tobacco Use  . Smoking status: Never Smoker  . Smokeless tobacco: Never Used  Substance and Sexual Activity  . Alcohol use: No  . Drug use: No  . Sexual activity: Not on file  Other Topics Concern  . Not on file  Social History Narrative  . Not on file   Social Determinants of Health   Financial Resource Strain:   . Difficulty of Paying Living Expenses: Not on file  Food Insecurity:   . Worried About Charity fundraiser in the Last Year: Not on file  . Ran Out of Food in the Last Year: Not on file  Transportation Needs:   . Lack of Transportation (Medical): Not on file  . Lack of Transportation (Non-Medical): Not on file  Physical Activity:   . Days of Exercise per Week: Not on file  . Minutes of Exercise per Session: Not on file  Stress:   . Feeling of Stress : Not on file  Social Connections:   . Frequency of Communication with Friends and Family: Not on file  . Frequency of Social Gatherings with Friends and Family: Not on file  . Attends Religious Services: Not on file  . Active Member of Clubs or Organizations: Not on file  . Attends  Archivist Meetings: Not on file  . Marital Status: Not on file     Family History: The patient's family history includes Breast cancer in her sister; Cancer in her father. There is no history of Diabetes or Heart attack. ROS:   Please see the history of present illness.    All other systems reviewed and are negative.  EKGs/Labs/Other Studies Reviewed:    The following studies were reviewed today:    Recent Labs: 09/24/2018: Magnesium 1.3 09/28/2018: B Natriuretic Peptide 241.8 11/25/2018: ALT 32; BUN 15; Creatinine, Ser 1.39; Hemoglobin 11.4; NT-Pro BNP 2,176; Platelets 255; Potassium 4.0; Sodium 133; TSH 1.130  Recent Lipid Panel    Component Value Date/Time   CHOL 81 09/08/2018 0529   TRIG 33 09/08/2018 0529   HDL 37 (L) 09/08/2018 0529   CHOLHDL 2.2  09/08/2018 0529   VLDL 7 09/08/2018 0529   LDLCALC 37 09/08/2018 0529    Physical Exam:    VS:  BP 140/60   Pulse 78   Ht 5\' 6"  (1.676 m)   Wt 132 lb 9.6 oz (60.1 kg)   SpO2 97%   BMI 21.40 kg/m     Wt Readings from Last 3 Encounters:  05/16/19 132 lb 9.6 oz (60.1 kg)  11/30/18 155 lb (70.3 kg)  11/25/18 156 lb 3.2 oz (70.9 kg)     GEN:  Well nourished, well developed in no acute distress HEENT: Normal NECK: No JVD; No carotid bruits LYMPHATICS: No lymphadenopathy CARDIAC: RRR, no murmurs, rubs, gallops RESPIRATORY:  Clear to auscultation without rales, wheezing or rhonchi  ABDOMEN: Soft, non-tender, non-distended MUSCULOSKELETAL:  No edema; No deformity  SKIN: Warm and dry NEUROLOGIC:  Alert and oriented x 3 PSYCHIATRIC:  Normal affect    Signed, Shirlee More, MD  05/16/2019 3:12 PM    [place]

## 2019-05-16 ENCOUNTER — Other Ambulatory Visit: Payer: Self-pay

## 2019-05-16 ENCOUNTER — Ambulatory Visit (INDEPENDENT_AMBULATORY_CARE_PROVIDER_SITE_OTHER): Payer: Medicare PPO | Admitting: Cardiology

## 2019-05-16 ENCOUNTER — Encounter: Payer: Self-pay | Admitting: Cardiology

## 2019-05-16 VITALS — BP 140/60 | HR 78 | Ht 66.0 in | Wt 132.6 lb

## 2019-05-16 DIAGNOSIS — I110 Hypertensive heart disease with heart failure: Secondary | ICD-10-CM

## 2019-05-16 DIAGNOSIS — I5042 Chronic combined systolic (congestive) and diastolic (congestive) heart failure: Secondary | ICD-10-CM

## 2019-05-16 DIAGNOSIS — Z79899 Other long term (current) drug therapy: Secondary | ICD-10-CM

## 2019-05-16 DIAGNOSIS — I480 Paroxysmal atrial fibrillation: Secondary | ICD-10-CM

## 2019-05-16 DIAGNOSIS — I428 Other cardiomyopathies: Secondary | ICD-10-CM

## 2019-05-16 DIAGNOSIS — Z7901 Long term (current) use of anticoagulants: Secondary | ICD-10-CM

## 2019-05-16 DIAGNOSIS — Z1329 Encounter for screening for other suspected endocrine disorder: Secondary | ICD-10-CM

## 2019-05-16 NOTE — Patient Instructions (Signed)
Medication Instructions:  Your physician recommends that you continue on your current medications as directed. Please refer to the Current Medication list given to you today.  *If you need a refill on your cardiac medications before your next appointment, please call your pharmacy*  Lab Work: Your physician recommends that you have a CMP, lipid, ProBNP and TSH drawn today  If you have labs (blood work) drawn today and your tests are completely normal, you will receive your results only by: Marland Kitchen MyChart Message (if you have MyChart) OR . A paper copy in the mail If you have any lab test that is abnormal or we need to change your treatment, we will call you to review the results.  Testing/Procedures: NONE  Follow-Up: At [facility], you and your health needs are our priority.  As part of our continuing mission to provide you with exceptional heart care, we have created designated Provider Care Teams.  These Care Teams include your primary Cardiologist (physician) and Advanced Practice Providers (APPs -  Physician Assistants and Nurse Practitioners) who all work together to provide you with the care you need, when you need it.  Your next appointment:   6 month(s)  The format for your next appointment:   In Person  Provider:   Shirlee More, MD

## 2019-05-17 ENCOUNTER — Telehealth: Payer: Self-pay

## 2019-05-17 LAB — COMPREHENSIVE METABOLIC PANEL
ALT: 12 IU/L (ref 0–32)
AST: 22 IU/L (ref 0–40)
Albumin/Globulin Ratio: 1.9 (ref 1.2–2.2)
Albumin: 3.9 g/dL (ref 3.6–4.6)
Alkaline Phosphatase: 124 IU/L — ABNORMAL HIGH (ref 39–117)
BUN/Creatinine Ratio: 15 (ref 12–28)
BUN: 22 mg/dL (ref 8–27)
Bilirubin Total: 0.2 mg/dL (ref 0.0–1.2)
CO2: 25 mmol/L (ref 20–29)
Calcium: 9.5 mg/dL (ref 8.7–10.3)
Chloride: 101 mmol/L (ref 96–106)
Creatinine, Ser: 1.5 mg/dL — ABNORMAL HIGH (ref 0.57–1.00)
GFR calc Af Amer: 36 mL/min/{1.73_m2} — ABNORMAL LOW (ref >59)
GFR calc non Af Amer: 32 mL/min/{1.73_m2} — ABNORMAL LOW (ref >59)
Globulin, Total: 2.1 g/dL (ref 1.5–4.5)
Glucose: 108 mg/dL — ABNORMAL HIGH (ref 65–99)
Potassium: 4.3 mmol/L (ref 3.5–5.2)
Sodium: 140 mmol/L (ref 134–144)
Total Protein: 6 g/dL (ref 6.0–8.5)

## 2019-05-17 LAB — TSH: TSH: 0.748 u[IU]/mL (ref 0.450–4.500)

## 2019-05-17 LAB — LIPID PANEL
Chol/HDL Ratio: 2.8 ratio (ref 0.0–4.4)
Cholesterol, Total: 151 mg/dL (ref 100–199)
HDL: 53 mg/dL (ref >39)
LDL Chol Calc (NIH): 74 mg/dL (ref 0–99)
Triglycerides: 140 mg/dL (ref 0–149)
VLDL Cholesterol Cal: 24 mg/dL (ref 5–40)

## 2019-05-17 LAB — PRO B NATRIURETIC PEPTIDE: NT-Pro BNP: 3973 pg/mL — ABNORMAL HIGH (ref 0–738)

## 2019-05-17 NOTE — Telephone Encounter (Signed)
-----   Message from Richardo Priest, MD sent at 05/17/2019  9:14 AM EST ----- Good result no changes in treatment

## 2019-05-17 NOTE — Telephone Encounter (Signed)
Results relayed, no further questions. 

## 2019-06-16 DIAGNOSIS — I482 Chronic atrial fibrillation, unspecified: Secondary | ICD-10-CM | POA: Diagnosis not present

## 2019-06-16 DIAGNOSIS — D638 Anemia in other chronic diseases classified elsewhere: Secondary | ICD-10-CM | POA: Diagnosis not present

## 2019-06-16 DIAGNOSIS — I5040 Unspecified combined systolic (congestive) and diastolic (congestive) heart failure: Secondary | ICD-10-CM | POA: Diagnosis not present

## 2019-06-16 DIAGNOSIS — E1142 Type 2 diabetes mellitus with diabetic polyneuropathy: Secondary | ICD-10-CM | POA: Diagnosis not present

## 2019-06-16 DIAGNOSIS — E785 Hyperlipidemia, unspecified: Secondary | ICD-10-CM | POA: Diagnosis not present

## 2019-06-16 DIAGNOSIS — E039 Hypothyroidism, unspecified: Secondary | ICD-10-CM | POA: Diagnosis not present

## 2019-06-16 DIAGNOSIS — E1122 Type 2 diabetes mellitus with diabetic chronic kidney disease: Secondary | ICD-10-CM | POA: Diagnosis not present

## 2019-06-16 DIAGNOSIS — I10 Essential (primary) hypertension: Secondary | ICD-10-CM | POA: Diagnosis not present

## 2019-06-16 DIAGNOSIS — N184 Chronic kidney disease, stage 4 (severe): Secondary | ICD-10-CM | POA: Diagnosis not present

## 2019-07-10 DIAGNOSIS — N183 Chronic kidney disease, stage 3 unspecified: Secondary | ICD-10-CM | POA: Diagnosis not present

## 2019-07-10 DIAGNOSIS — E039 Hypothyroidism, unspecified: Secondary | ICD-10-CM | POA: Diagnosis not present

## 2019-07-10 DIAGNOSIS — R55 Syncope and collapse: Secondary | ICD-10-CM | POA: Diagnosis not present

## 2019-07-10 DIAGNOSIS — S299XXA Unspecified injury of thorax, initial encounter: Secondary | ICD-10-CM | POA: Diagnosis not present

## 2019-07-10 DIAGNOSIS — R8271 Bacteriuria: Secondary | ICD-10-CM | POA: Diagnosis not present

## 2019-07-10 DIAGNOSIS — R531 Weakness: Secondary | ICD-10-CM | POA: Diagnosis not present

## 2019-07-10 DIAGNOSIS — R4182 Altered mental status, unspecified: Secondary | ICD-10-CM | POA: Diagnosis not present

## 2019-07-11 DIAGNOSIS — R531 Weakness: Secondary | ICD-10-CM | POA: Diagnosis not present

## 2019-07-11 DIAGNOSIS — R55 Syncope and collapse: Secondary | ICD-10-CM | POA: Diagnosis not present

## 2019-07-11 DIAGNOSIS — N183 Chronic kidney disease, stage 3 unspecified: Secondary | ICD-10-CM | POA: Diagnosis not present

## 2019-07-11 DIAGNOSIS — E039 Hypothyroidism, unspecified: Secondary | ICD-10-CM | POA: Diagnosis not present

## 2019-07-11 DIAGNOSIS — R8271 Bacteriuria: Secondary | ICD-10-CM | POA: Diagnosis not present

## 2019-07-12 DIAGNOSIS — M2550 Pain in unspecified joint: Secondary | ICD-10-CM | POA: Diagnosis not present

## 2019-07-12 DIAGNOSIS — I10 Essential (primary) hypertension: Secondary | ICD-10-CM | POA: Diagnosis not present

## 2019-07-12 DIAGNOSIS — R8271 Bacteriuria: Secondary | ICD-10-CM | POA: Diagnosis not present

## 2019-07-12 DIAGNOSIS — N183 Chronic kidney disease, stage 3 unspecified: Secondary | ICD-10-CM | POA: Diagnosis not present

## 2019-07-12 DIAGNOSIS — E785 Hyperlipidemia, unspecified: Secondary | ICD-10-CM | POA: Diagnosis not present

## 2019-07-12 DIAGNOSIS — R55 Syncope and collapse: Secondary | ICD-10-CM | POA: Diagnosis not present

## 2019-07-12 DIAGNOSIS — N390 Urinary tract infection, site not specified: Secondary | ICD-10-CM | POA: Diagnosis not present

## 2019-07-12 DIAGNOSIS — I129 Hypertensive chronic kidney disease with stage 1 through stage 4 chronic kidney disease, or unspecified chronic kidney disease: Secondary | ICD-10-CM | POA: Diagnosis not present

## 2019-07-12 DIAGNOSIS — R0902 Hypoxemia: Secondary | ICD-10-CM | POA: Diagnosis not present

## 2019-07-12 DIAGNOSIS — R531 Weakness: Secondary | ICD-10-CM | POA: Diagnosis not present

## 2019-07-12 DIAGNOSIS — L03113 Cellulitis of right upper limb: Secondary | ICD-10-CM | POA: Diagnosis not present

## 2019-07-12 DIAGNOSIS — R42 Dizziness and giddiness: Secondary | ICD-10-CM | POA: Diagnosis not present

## 2019-07-12 DIAGNOSIS — I447 Left bundle-branch block, unspecified: Secondary | ICD-10-CM | POA: Diagnosis not present

## 2019-07-12 DIAGNOSIS — Z7401 Bed confinement status: Secondary | ICD-10-CM | POA: Diagnosis not present

## 2019-07-12 DIAGNOSIS — R110 Nausea: Secondary | ICD-10-CM | POA: Diagnosis not present

## 2019-07-12 DIAGNOSIS — I482 Chronic atrial fibrillation, unspecified: Secondary | ICD-10-CM | POA: Diagnosis not present

## 2019-07-12 DIAGNOSIS — E039 Hypothyroidism, unspecified: Secondary | ICD-10-CM | POA: Diagnosis not present

## 2019-07-12 DIAGNOSIS — I951 Orthostatic hypotension: Secondary | ICD-10-CM | POA: Diagnosis not present

## 2019-07-12 DIAGNOSIS — S40021A Contusion of right upper arm, initial encounter: Secondary | ICD-10-CM | POA: Diagnosis not present

## 2019-07-13 DIAGNOSIS — E039 Hypothyroidism, unspecified: Secondary | ICD-10-CM | POA: Diagnosis not present

## 2019-07-13 DIAGNOSIS — R55 Syncope and collapse: Secondary | ICD-10-CM

## 2019-07-13 DIAGNOSIS — R8271 Bacteriuria: Secondary | ICD-10-CM | POA: Diagnosis not present

## 2019-07-13 DIAGNOSIS — R531 Weakness: Secondary | ICD-10-CM | POA: Diagnosis not present

## 2019-07-14 DIAGNOSIS — E039 Hypothyroidism, unspecified: Secondary | ICD-10-CM | POA: Diagnosis not present

## 2019-07-14 DIAGNOSIS — R531 Weakness: Secondary | ICD-10-CM | POA: Diagnosis not present

## 2019-07-14 DIAGNOSIS — R55 Syncope and collapse: Secondary | ICD-10-CM | POA: Diagnosis not present

## 2019-07-14 DIAGNOSIS — R8271 Bacteriuria: Secondary | ICD-10-CM | POA: Diagnosis not present

## 2019-07-15 DIAGNOSIS — R55 Syncope and collapse: Secondary | ICD-10-CM | POA: Diagnosis not present

## 2019-07-15 DIAGNOSIS — R8271 Bacteriuria: Secondary | ICD-10-CM | POA: Diagnosis not present

## 2019-07-15 DIAGNOSIS — R531 Weakness: Secondary | ICD-10-CM | POA: Diagnosis not present

## 2019-07-15 DIAGNOSIS — E039 Hypothyroidism, unspecified: Secondary | ICD-10-CM | POA: Diagnosis not present

## 2019-07-16 DIAGNOSIS — R531 Weakness: Secondary | ICD-10-CM | POA: Diagnosis not present

## 2019-07-16 DIAGNOSIS — R8271 Bacteriuria: Secondary | ICD-10-CM | POA: Diagnosis not present

## 2019-07-16 DIAGNOSIS — E039 Hypothyroidism, unspecified: Secondary | ICD-10-CM | POA: Diagnosis not present

## 2019-07-16 DIAGNOSIS — R55 Syncope and collapse: Secondary | ICD-10-CM | POA: Diagnosis not present

## 2019-07-17 DIAGNOSIS — E039 Hypothyroidism, unspecified: Secondary | ICD-10-CM | POA: Diagnosis not present

## 2019-07-17 DIAGNOSIS — Z7401 Bed confinement status: Secondary | ICD-10-CM | POA: Diagnosis not present

## 2019-07-17 DIAGNOSIS — R0902 Hypoxemia: Secondary | ICD-10-CM | POA: Diagnosis not present

## 2019-07-17 DIAGNOSIS — M2550 Pain in unspecified joint: Secondary | ICD-10-CM | POA: Diagnosis not present

## 2019-07-17 DIAGNOSIS — N1831 Chronic kidney disease, stage 3a: Secondary | ICD-10-CM | POA: Diagnosis not present

## 2019-07-17 DIAGNOSIS — R42 Dizziness and giddiness: Secondary | ICD-10-CM | POA: Diagnosis not present

## 2019-07-17 DIAGNOSIS — I482 Chronic atrial fibrillation, unspecified: Secondary | ICD-10-CM | POA: Diagnosis not present

## 2019-07-17 DIAGNOSIS — E785 Hyperlipidemia, unspecified: Secondary | ICD-10-CM | POA: Diagnosis not present

## 2019-07-17 DIAGNOSIS — N390 Urinary tract infection, site not specified: Secondary | ICD-10-CM | POA: Diagnosis not present

## 2019-07-17 DIAGNOSIS — R110 Nausea: Secondary | ICD-10-CM | POA: Diagnosis not present

## 2019-07-17 DIAGNOSIS — R262 Difficulty in walking, not elsewhere classified: Secondary | ICD-10-CM | POA: Diagnosis not present

## 2019-07-17 DIAGNOSIS — R55 Syncope and collapse: Secondary | ICD-10-CM | POA: Diagnosis not present

## 2019-07-17 DIAGNOSIS — D649 Anemia, unspecified: Secondary | ICD-10-CM | POA: Diagnosis not present

## 2019-07-17 DIAGNOSIS — N183 Chronic kidney disease, stage 3 unspecified: Secondary | ICD-10-CM | POA: Diagnosis not present

## 2019-07-17 DIAGNOSIS — I10 Essential (primary) hypertension: Secondary | ICD-10-CM | POA: Diagnosis not present

## 2019-07-17 DIAGNOSIS — I951 Orthostatic hypotension: Secondary | ICD-10-CM | POA: Diagnosis not present

## 2019-07-17 DIAGNOSIS — R531 Weakness: Secondary | ICD-10-CM | POA: Diagnosis not present

## 2019-07-17 DIAGNOSIS — I4891 Unspecified atrial fibrillation: Secondary | ICD-10-CM | POA: Diagnosis not present

## 2019-07-17 DIAGNOSIS — R8271 Bacteriuria: Secondary | ICD-10-CM | POA: Diagnosis not present

## 2019-07-24 DIAGNOSIS — R262 Difficulty in walking, not elsewhere classified: Secondary | ICD-10-CM | POA: Diagnosis not present

## 2019-07-24 DIAGNOSIS — N1831 Chronic kidney disease, stage 3a: Secondary | ICD-10-CM | POA: Diagnosis not present

## 2019-07-24 DIAGNOSIS — I4891 Unspecified atrial fibrillation: Secondary | ICD-10-CM | POA: Diagnosis not present

## 2019-07-24 DIAGNOSIS — D649 Anemia, unspecified: Secondary | ICD-10-CM | POA: Diagnosis not present

## 2019-08-03 ENCOUNTER — Ambulatory Visit: Payer: Medicare PPO | Admitting: Sports Medicine

## 2019-08-04 DIAGNOSIS — R55 Syncope and collapse: Secondary | ICD-10-CM | POA: Diagnosis not present

## 2019-08-04 DIAGNOSIS — I129 Hypertensive chronic kidney disease with stage 1 through stage 4 chronic kidney disease, or unspecified chronic kidney disease: Secondary | ICD-10-CM | POA: Diagnosis not present

## 2019-08-04 DIAGNOSIS — N390 Urinary tract infection, site not specified: Secondary | ICD-10-CM | POA: Diagnosis not present

## 2019-08-04 DIAGNOSIS — B965 Pseudomonas (aeruginosa) (mallei) (pseudomallei) as the cause of diseases classified elsewhere: Secondary | ICD-10-CM | POA: Diagnosis not present

## 2019-08-04 DIAGNOSIS — R531 Weakness: Secondary | ICD-10-CM | POA: Diagnosis not present

## 2019-08-04 DIAGNOSIS — I951 Orthostatic hypotension: Secondary | ICD-10-CM | POA: Diagnosis not present

## 2019-08-10 DIAGNOSIS — I129 Hypertensive chronic kidney disease with stage 1 through stage 4 chronic kidney disease, or unspecified chronic kidney disease: Secondary | ICD-10-CM | POA: Diagnosis not present

## 2019-08-10 DIAGNOSIS — N183 Chronic kidney disease, stage 3 unspecified: Secondary | ICD-10-CM | POA: Diagnosis not present

## 2019-08-10 DIAGNOSIS — D631 Anemia in chronic kidney disease: Secondary | ICD-10-CM | POA: Diagnosis not present

## 2019-08-10 DIAGNOSIS — M810 Age-related osteoporosis without current pathological fracture: Secondary | ICD-10-CM | POA: Diagnosis not present

## 2019-08-10 DIAGNOSIS — Z9181 History of falling: Secondary | ICD-10-CM | POA: Diagnosis not present

## 2019-08-10 DIAGNOSIS — I482 Chronic atrial fibrillation, unspecified: Secondary | ICD-10-CM | POA: Diagnosis not present

## 2019-08-10 DIAGNOSIS — I951 Orthostatic hypotension: Secondary | ICD-10-CM | POA: Diagnosis not present

## 2019-08-10 DIAGNOSIS — E1122 Type 2 diabetes mellitus with diabetic chronic kidney disease: Secondary | ICD-10-CM | POA: Diagnosis not present

## 2019-08-10 DIAGNOSIS — N390 Urinary tract infection, site not specified: Secondary | ICD-10-CM | POA: Diagnosis not present

## 2019-08-15 DIAGNOSIS — N183 Chronic kidney disease, stage 3 unspecified: Secondary | ICD-10-CM | POA: Diagnosis not present

## 2019-08-15 DIAGNOSIS — E1122 Type 2 diabetes mellitus with diabetic chronic kidney disease: Secondary | ICD-10-CM | POA: Diagnosis not present

## 2019-08-15 DIAGNOSIS — N390 Urinary tract infection, site not specified: Secondary | ICD-10-CM | POA: Diagnosis not present

## 2019-08-15 DIAGNOSIS — D631 Anemia in chronic kidney disease: Secondary | ICD-10-CM | POA: Diagnosis not present

## 2019-08-15 DIAGNOSIS — I482 Chronic atrial fibrillation, unspecified: Secondary | ICD-10-CM | POA: Diagnosis not present

## 2019-08-15 DIAGNOSIS — M810 Age-related osteoporosis without current pathological fracture: Secondary | ICD-10-CM | POA: Diagnosis not present

## 2019-08-15 DIAGNOSIS — I951 Orthostatic hypotension: Secondary | ICD-10-CM | POA: Diagnosis not present

## 2019-08-15 DIAGNOSIS — I129 Hypertensive chronic kidney disease with stage 1 through stage 4 chronic kidney disease, or unspecified chronic kidney disease: Secondary | ICD-10-CM | POA: Diagnosis not present

## 2019-08-15 DIAGNOSIS — Z9181 History of falling: Secondary | ICD-10-CM | POA: Diagnosis not present

## 2019-08-16 DIAGNOSIS — I482 Chronic atrial fibrillation, unspecified: Secondary | ICD-10-CM | POA: Diagnosis not present

## 2019-08-16 DIAGNOSIS — E1122 Type 2 diabetes mellitus with diabetic chronic kidney disease: Secondary | ICD-10-CM | POA: Diagnosis not present

## 2019-08-16 DIAGNOSIS — I129 Hypertensive chronic kidney disease with stage 1 through stage 4 chronic kidney disease, or unspecified chronic kidney disease: Secondary | ICD-10-CM | POA: Diagnosis not present

## 2019-08-16 DIAGNOSIS — I951 Orthostatic hypotension: Secondary | ICD-10-CM | POA: Diagnosis not present

## 2019-08-16 DIAGNOSIS — D631 Anemia in chronic kidney disease: Secondary | ICD-10-CM | POA: Diagnosis not present

## 2019-08-16 DIAGNOSIS — M810 Age-related osteoporosis without current pathological fracture: Secondary | ICD-10-CM | POA: Diagnosis not present

## 2019-08-16 DIAGNOSIS — N390 Urinary tract infection, site not specified: Secondary | ICD-10-CM | POA: Diagnosis not present

## 2019-08-16 DIAGNOSIS — N183 Chronic kidney disease, stage 3 unspecified: Secondary | ICD-10-CM | POA: Diagnosis not present

## 2019-08-16 DIAGNOSIS — Z9181 History of falling: Secondary | ICD-10-CM | POA: Diagnosis not present

## 2019-08-18 DIAGNOSIS — I482 Chronic atrial fibrillation, unspecified: Secondary | ICD-10-CM | POA: Diagnosis not present

## 2019-08-18 DIAGNOSIS — D631 Anemia in chronic kidney disease: Secondary | ICD-10-CM | POA: Diagnosis not present

## 2019-08-18 DIAGNOSIS — M810 Age-related osteoporosis without current pathological fracture: Secondary | ICD-10-CM | POA: Diagnosis not present

## 2019-08-18 DIAGNOSIS — N183 Chronic kidney disease, stage 3 unspecified: Secondary | ICD-10-CM | POA: Diagnosis not present

## 2019-08-18 DIAGNOSIS — E1122 Type 2 diabetes mellitus with diabetic chronic kidney disease: Secondary | ICD-10-CM | POA: Diagnosis not present

## 2019-08-18 DIAGNOSIS — I129 Hypertensive chronic kidney disease with stage 1 through stage 4 chronic kidney disease, or unspecified chronic kidney disease: Secondary | ICD-10-CM | POA: Diagnosis not present

## 2019-08-18 DIAGNOSIS — I951 Orthostatic hypotension: Secondary | ICD-10-CM | POA: Diagnosis not present

## 2019-08-18 DIAGNOSIS — Z9181 History of falling: Secondary | ICD-10-CM | POA: Diagnosis not present

## 2019-08-18 DIAGNOSIS — N390 Urinary tract infection, site not specified: Secondary | ICD-10-CM | POA: Diagnosis not present

## 2019-08-21 DIAGNOSIS — M810 Age-related osteoporosis without current pathological fracture: Secondary | ICD-10-CM | POA: Diagnosis not present

## 2019-08-21 DIAGNOSIS — D631 Anemia in chronic kidney disease: Secondary | ICD-10-CM | POA: Diagnosis not present

## 2019-08-21 DIAGNOSIS — I951 Orthostatic hypotension: Secondary | ICD-10-CM | POA: Diagnosis not present

## 2019-08-21 DIAGNOSIS — I129 Hypertensive chronic kidney disease with stage 1 through stage 4 chronic kidney disease, or unspecified chronic kidney disease: Secondary | ICD-10-CM | POA: Diagnosis not present

## 2019-08-21 DIAGNOSIS — N183 Chronic kidney disease, stage 3 unspecified: Secondary | ICD-10-CM | POA: Diagnosis not present

## 2019-08-21 DIAGNOSIS — I482 Chronic atrial fibrillation, unspecified: Secondary | ICD-10-CM | POA: Diagnosis not present

## 2019-08-21 DIAGNOSIS — N390 Urinary tract infection, site not specified: Secondary | ICD-10-CM | POA: Diagnosis not present

## 2019-08-21 DIAGNOSIS — Z9181 History of falling: Secondary | ICD-10-CM | POA: Diagnosis not present

## 2019-08-21 DIAGNOSIS — E1122 Type 2 diabetes mellitus with diabetic chronic kidney disease: Secondary | ICD-10-CM | POA: Diagnosis not present

## 2019-08-28 DIAGNOSIS — N390 Urinary tract infection, site not specified: Secondary | ICD-10-CM | POA: Diagnosis not present

## 2019-08-28 DIAGNOSIS — D631 Anemia in chronic kidney disease: Secondary | ICD-10-CM | POA: Diagnosis not present

## 2019-08-28 DIAGNOSIS — N183 Chronic kidney disease, stage 3 unspecified: Secondary | ICD-10-CM | POA: Diagnosis not present

## 2019-08-28 DIAGNOSIS — I129 Hypertensive chronic kidney disease with stage 1 through stage 4 chronic kidney disease, or unspecified chronic kidney disease: Secondary | ICD-10-CM | POA: Diagnosis not present

## 2019-08-28 DIAGNOSIS — E1122 Type 2 diabetes mellitus with diabetic chronic kidney disease: Secondary | ICD-10-CM | POA: Diagnosis not present

## 2019-08-28 DIAGNOSIS — Z9181 History of falling: Secondary | ICD-10-CM | POA: Diagnosis not present

## 2019-08-28 DIAGNOSIS — I951 Orthostatic hypotension: Secondary | ICD-10-CM | POA: Diagnosis not present

## 2019-08-28 DIAGNOSIS — I482 Chronic atrial fibrillation, unspecified: Secondary | ICD-10-CM | POA: Diagnosis not present

## 2019-08-28 DIAGNOSIS — M810 Age-related osteoporosis without current pathological fracture: Secondary | ICD-10-CM | POA: Diagnosis not present

## 2019-09-07 DIAGNOSIS — Z9181 History of falling: Secondary | ICD-10-CM | POA: Diagnosis not present

## 2019-09-07 DIAGNOSIS — D631 Anemia in chronic kidney disease: Secondary | ICD-10-CM | POA: Diagnosis not present

## 2019-09-07 DIAGNOSIS — I129 Hypertensive chronic kidney disease with stage 1 through stage 4 chronic kidney disease, or unspecified chronic kidney disease: Secondary | ICD-10-CM | POA: Diagnosis not present

## 2019-09-07 DIAGNOSIS — E1122 Type 2 diabetes mellitus with diabetic chronic kidney disease: Secondary | ICD-10-CM | POA: Diagnosis not present

## 2019-09-07 DIAGNOSIS — I951 Orthostatic hypotension: Secondary | ICD-10-CM | POA: Diagnosis not present

## 2019-09-07 DIAGNOSIS — N390 Urinary tract infection, site not specified: Secondary | ICD-10-CM | POA: Diagnosis not present

## 2019-09-07 DIAGNOSIS — N183 Chronic kidney disease, stage 3 unspecified: Secondary | ICD-10-CM | POA: Diagnosis not present

## 2019-09-07 DIAGNOSIS — I482 Chronic atrial fibrillation, unspecified: Secondary | ICD-10-CM | POA: Diagnosis not present

## 2019-09-07 DIAGNOSIS — M810 Age-related osteoporosis without current pathological fracture: Secondary | ICD-10-CM | POA: Diagnosis not present

## 2019-09-09 DIAGNOSIS — N390 Urinary tract infection, site not specified: Secondary | ICD-10-CM | POA: Diagnosis not present

## 2019-09-09 DIAGNOSIS — D631 Anemia in chronic kidney disease: Secondary | ICD-10-CM | POA: Diagnosis not present

## 2019-09-09 DIAGNOSIS — I951 Orthostatic hypotension: Secondary | ICD-10-CM | POA: Diagnosis not present

## 2019-09-09 DIAGNOSIS — M810 Age-related osteoporosis without current pathological fracture: Secondary | ICD-10-CM | POA: Diagnosis not present

## 2019-09-09 DIAGNOSIS — I482 Chronic atrial fibrillation, unspecified: Secondary | ICD-10-CM | POA: Diagnosis not present

## 2019-09-09 DIAGNOSIS — N183 Chronic kidney disease, stage 3 unspecified: Secondary | ICD-10-CM | POA: Diagnosis not present

## 2019-09-09 DIAGNOSIS — E1122 Type 2 diabetes mellitus with diabetic chronic kidney disease: Secondary | ICD-10-CM | POA: Diagnosis not present

## 2019-09-09 DIAGNOSIS — I129 Hypertensive chronic kidney disease with stage 1 through stage 4 chronic kidney disease, or unspecified chronic kidney disease: Secondary | ICD-10-CM | POA: Diagnosis not present

## 2019-09-09 DIAGNOSIS — Z9181 History of falling: Secondary | ICD-10-CM | POA: Diagnosis not present

## 2019-09-12 DIAGNOSIS — N390 Urinary tract infection, site not specified: Secondary | ICD-10-CM | POA: Diagnosis not present

## 2019-09-12 DIAGNOSIS — Z9181 History of falling: Secondary | ICD-10-CM | POA: Diagnosis not present

## 2019-09-12 DIAGNOSIS — E1122 Type 2 diabetes mellitus with diabetic chronic kidney disease: Secondary | ICD-10-CM | POA: Diagnosis not present

## 2019-09-12 DIAGNOSIS — I482 Chronic atrial fibrillation, unspecified: Secondary | ICD-10-CM | POA: Diagnosis not present

## 2019-09-12 DIAGNOSIS — M810 Age-related osteoporosis without current pathological fracture: Secondary | ICD-10-CM | POA: Diagnosis not present

## 2019-09-12 DIAGNOSIS — I951 Orthostatic hypotension: Secondary | ICD-10-CM | POA: Diagnosis not present

## 2019-09-12 DIAGNOSIS — I129 Hypertensive chronic kidney disease with stage 1 through stage 4 chronic kidney disease, or unspecified chronic kidney disease: Secondary | ICD-10-CM | POA: Diagnosis not present

## 2019-09-12 DIAGNOSIS — N183 Chronic kidney disease, stage 3 unspecified: Secondary | ICD-10-CM | POA: Diagnosis not present

## 2019-09-12 DIAGNOSIS — D631 Anemia in chronic kidney disease: Secondary | ICD-10-CM | POA: Diagnosis not present

## 2019-09-14 ENCOUNTER — Ambulatory Visit (INDEPENDENT_AMBULATORY_CARE_PROVIDER_SITE_OTHER): Payer: Medicare PPO | Admitting: Sports Medicine

## 2019-09-14 ENCOUNTER — Encounter: Payer: Self-pay | Admitting: Sports Medicine

## 2019-09-14 ENCOUNTER — Other Ambulatory Visit: Payer: Self-pay

## 2019-09-14 DIAGNOSIS — I482 Chronic atrial fibrillation, unspecified: Secondary | ICD-10-CM | POA: Diagnosis not present

## 2019-09-14 DIAGNOSIS — M79674 Pain in right toe(s): Secondary | ICD-10-CM | POA: Diagnosis not present

## 2019-09-14 DIAGNOSIS — I5040 Unspecified combined systolic (congestive) and diastolic (congestive) heart failure: Secondary | ICD-10-CM | POA: Diagnosis not present

## 2019-09-14 DIAGNOSIS — E785 Hyperlipidemia, unspecified: Secondary | ICD-10-CM | POA: Diagnosis not present

## 2019-09-14 DIAGNOSIS — N184 Chronic kidney disease, stage 4 (severe): Secondary | ICD-10-CM | POA: Diagnosis not present

## 2019-09-14 DIAGNOSIS — E1140 Type 2 diabetes mellitus with diabetic neuropathy, unspecified: Secondary | ICD-10-CM

## 2019-09-14 DIAGNOSIS — B351 Tinea unguium: Secondary | ICD-10-CM

## 2019-09-14 DIAGNOSIS — D649 Anemia, unspecified: Secondary | ICD-10-CM | POA: Diagnosis not present

## 2019-09-14 DIAGNOSIS — I110 Hypertensive heart disease with heart failure: Secondary | ICD-10-CM | POA: Diagnosis not present

## 2019-09-14 DIAGNOSIS — M79675 Pain in left toe(s): Secondary | ICD-10-CM

## 2019-09-14 DIAGNOSIS — L84 Corns and callosities: Secondary | ICD-10-CM

## 2019-09-14 DIAGNOSIS — E039 Hypothyroidism, unspecified: Secondary | ICD-10-CM | POA: Diagnosis not present

## 2019-09-14 DIAGNOSIS — E1142 Type 2 diabetes mellitus with diabetic polyneuropathy: Secondary | ICD-10-CM | POA: Diagnosis not present

## 2019-09-14 NOTE — Progress Notes (Signed)
Subjective: Maureen Stout is a 86 y.o. female patient with history of diabetes who returns to office today for diabetic nail and callus care. Patient reports that she is doing good and is trying to walk more and has noticed a little bit of buildup on the bottom of her left foot.  Patient denies any other issues.   FBS not recorded.   Objective: General: Patient is awake, alert, and oriented x 3 and in no acute distress.  Integument: Skin is warm, dry and supple bilateral. Nails are elongated thickened and dystrophic with subungual debris, consistent with onychomycosis, 1-5 on right 1,2,4,5 on left.  Callus with pinpoint bleeding noted to the left plantar forefoot submet 1.  No signs of infection.    Vasculature:  Dorsalis Pedis pulse 1/4 bilateral. Posterior Tibial pulse  1/4 bilateral. Capillary fill time <3 sec 1-5 bilateral. Scant hair growth to the level of the digits.Temperature gradient within normal limits. Mild varicosities present bilateral. No edema present bilateral.   Neurology: The patient has absent sensation measured with a 5.07/10g Semmes Weinstein Monofilament at all pedal sites bilateral. Vibratory sensation absent bilateral with tuning fork. No Babinski sign present bilateral.   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 Asymptomatic bunion and hammertoe pedal deformities noted bilateral. Muscular strength 5/5 in all lower extremity muscular groups bilateral without pain on range of motion. No tenderness with calf compression bilateral.  Assessment and plan Problem List Items Addressed This Visit    None    Visit Diagnoses    Pain due to onychomycosis of toenails of both feet    -  Primary   Pre-ulcerative calluses       Type 2 diabetes, controlled, with neuropathy ([place])         -Examined patient. -Re-Discussed and educated patient on diabetic foot care, especially with  regards to the vascular, neurological and musculoskeletal systems.  -Mechanically debrided  all nails x9 using sterile nail nipper without incident and smooth with rotary bur  -At no additional charge mechanically debrided callus plantar left forefoot submet 1 using a sterile chisel blade without incident and advised patient to refrain from walking barefoot or just with socks to keep pressure off this area to prevent ulceration -Return to office in 10-12 weeks for nail care  -Patient advised to call the office if any problems or questions arise in the meantime.  Landis Martins, DPM

## 2019-09-19 DIAGNOSIS — Z9181 History of falling: Secondary | ICD-10-CM | POA: Diagnosis not present

## 2019-09-19 DIAGNOSIS — N390 Urinary tract infection, site not specified: Secondary | ICD-10-CM | POA: Diagnosis not present

## 2019-09-19 DIAGNOSIS — I951 Orthostatic hypotension: Secondary | ICD-10-CM | POA: Diagnosis not present

## 2019-09-19 DIAGNOSIS — E1122 Type 2 diabetes mellitus with diabetic chronic kidney disease: Secondary | ICD-10-CM | POA: Diagnosis not present

## 2019-09-19 DIAGNOSIS — I482 Chronic atrial fibrillation, unspecified: Secondary | ICD-10-CM | POA: Diagnosis not present

## 2019-09-19 DIAGNOSIS — D631 Anemia in chronic kidney disease: Secondary | ICD-10-CM | POA: Diagnosis not present

## 2019-09-19 DIAGNOSIS — M810 Age-related osteoporosis without current pathological fracture: Secondary | ICD-10-CM | POA: Diagnosis not present

## 2019-09-19 DIAGNOSIS — N183 Chronic kidney disease, stage 3 unspecified: Secondary | ICD-10-CM | POA: Diagnosis not present

## 2019-09-19 DIAGNOSIS — I129 Hypertensive chronic kidney disease with stage 1 through stage 4 chronic kidney disease, or unspecified chronic kidney disease: Secondary | ICD-10-CM | POA: Diagnosis not present

## 2019-09-26 DIAGNOSIS — E1122 Type 2 diabetes mellitus with diabetic chronic kidney disease: Secondary | ICD-10-CM | POA: Diagnosis not present

## 2019-09-26 DIAGNOSIS — D631 Anemia in chronic kidney disease: Secondary | ICD-10-CM | POA: Diagnosis not present

## 2019-09-26 DIAGNOSIS — M810 Age-related osteoporosis without current pathological fracture: Secondary | ICD-10-CM | POA: Diagnosis not present

## 2019-09-26 DIAGNOSIS — I129 Hypertensive chronic kidney disease with stage 1 through stage 4 chronic kidney disease, or unspecified chronic kidney disease: Secondary | ICD-10-CM | POA: Diagnosis not present

## 2019-09-26 DIAGNOSIS — Z9181 History of falling: Secondary | ICD-10-CM | POA: Diagnosis not present

## 2019-09-26 DIAGNOSIS — N390 Urinary tract infection, site not specified: Secondary | ICD-10-CM | POA: Diagnosis not present

## 2019-09-26 DIAGNOSIS — N183 Chronic kidney disease, stage 3 unspecified: Secondary | ICD-10-CM | POA: Diagnosis not present

## 2019-09-26 DIAGNOSIS — I482 Chronic atrial fibrillation, unspecified: Secondary | ICD-10-CM | POA: Diagnosis not present

## 2019-09-26 DIAGNOSIS — I951 Orthostatic hypotension: Secondary | ICD-10-CM | POA: Diagnosis not present

## 2019-09-27 DIAGNOSIS — Z79899 Other long term (current) drug therapy: Secondary | ICD-10-CM | POA: Diagnosis not present

## 2019-09-27 DIAGNOSIS — D649 Anemia, unspecified: Secondary | ICD-10-CM | POA: Diagnosis not present

## 2019-09-27 DIAGNOSIS — N189 Chronic kidney disease, unspecified: Secondary | ICD-10-CM | POA: Diagnosis not present

## 2019-09-27 DIAGNOSIS — D631 Anemia in chronic kidney disease: Secondary | ICD-10-CM | POA: Diagnosis not present

## 2019-10-04 DIAGNOSIS — I129 Hypertensive chronic kidney disease with stage 1 through stage 4 chronic kidney disease, or unspecified chronic kidney disease: Secondary | ICD-10-CM | POA: Diagnosis not present

## 2019-10-04 DIAGNOSIS — I951 Orthostatic hypotension: Secondary | ICD-10-CM | POA: Diagnosis not present

## 2019-10-04 DIAGNOSIS — D631 Anemia in chronic kidney disease: Secondary | ICD-10-CM | POA: Diagnosis not present

## 2019-10-04 DIAGNOSIS — N390 Urinary tract infection, site not specified: Secondary | ICD-10-CM | POA: Diagnosis not present

## 2019-10-04 DIAGNOSIS — Z9181 History of falling: Secondary | ICD-10-CM | POA: Diagnosis not present

## 2019-10-04 DIAGNOSIS — N183 Chronic kidney disease, stage 3 unspecified: Secondary | ICD-10-CM | POA: Diagnosis not present

## 2019-10-04 DIAGNOSIS — E1122 Type 2 diabetes mellitus with diabetic chronic kidney disease: Secondary | ICD-10-CM | POA: Diagnosis not present

## 2019-10-04 DIAGNOSIS — I482 Chronic atrial fibrillation, unspecified: Secondary | ICD-10-CM | POA: Diagnosis not present

## 2019-10-04 DIAGNOSIS — M810 Age-related osteoporosis without current pathological fracture: Secondary | ICD-10-CM | POA: Diagnosis not present

## 2019-10-11 DIAGNOSIS — N189 Chronic kidney disease, unspecified: Secondary | ICD-10-CM | POA: Diagnosis not present

## 2019-10-11 DIAGNOSIS — D631 Anemia in chronic kidney disease: Secondary | ICD-10-CM | POA: Diagnosis not present

## 2019-10-13 DIAGNOSIS — N189 Chronic kidney disease, unspecified: Secondary | ICD-10-CM | POA: Diagnosis not present

## 2019-10-13 DIAGNOSIS — D631 Anemia in chronic kidney disease: Secondary | ICD-10-CM | POA: Diagnosis not present

## 2019-10-25 ENCOUNTER — Encounter: Payer: Self-pay | Admitting: Emergency Medicine

## 2019-10-25 NOTE — Telephone Encounter (Signed)
error 

## 2019-11-13 DIAGNOSIS — D631 Anemia in chronic kidney disease: Secondary | ICD-10-CM | POA: Diagnosis not present

## 2019-11-13 DIAGNOSIS — N189 Chronic kidney disease, unspecified: Secondary | ICD-10-CM | POA: Diagnosis not present

## 2019-11-15 DIAGNOSIS — D631 Anemia in chronic kidney disease: Secondary | ICD-10-CM | POA: Diagnosis not present

## 2019-11-15 DIAGNOSIS — N189 Chronic kidney disease, unspecified: Secondary | ICD-10-CM | POA: Diagnosis not present

## 2019-11-27 NOTE — Progress Notes (Signed)
Cardiology Office Note:    Date:  11/28/2019   ID:  Maureen Stout, DOB Nov 29, 1931, MRN [id number]  PCP:  Nicoletta Dress, MD  Cardiologist:  Shirlee More, MD    Referring MD: Nicoletta Dress, MD    ASSESSMENT:    1. Paroxysmal atrial fibrillation (HCC)   2. On amiodarone therapy   3. Chronic anticoagulation   4. Hypertensive heart disease with chronic combined systolic and diastolic congestive heart failure ([place])   5. Other cardiomyopathy ([place])    PLAN:    In order of problems listed above:  1. She maintained sinus rhythm a minimum dose amiodarone if not done at [facility] we will draw a CMP and a TSH T3-T4 as she is on thyroid supplements she has no evidence of liver or thyroid toxicity. 2. Continue low-dose anticoagulant I suspect she is anemic due to her kidney disease 3. BP is elevated she is fluid overloaded I asked her to take her diuretic 3 days a week which should help both problems 4. Improve cardiomyopathy EF was low normal August 2020 when she had syncope and she has had no recurrence  Recent hemoglobin was 7.6  Check labs today for liver thyroid and also draw CBC. When I review the results of send a copy to her primary care and oncologist Next appointment: 6 months   Medication Adjustments/Labs and Tests Ordered: Current medicines are reviewed at length with the patient today.  Concerns regarding medicines are outlined above.  Orders Placed This Encounter  Procedures  . EKG 12-Lead   No orders of the defined types were placed in this encounter.   Chief Complaint  Patient presents with  . Follow-up    History of Present Illness:    Maureen Stout is a 86 y.o. female with a hx of atrial fibrillation hypertension dyslipidemia chronic anticoagulation. She was admitted to  [facility] in May 2020 with heart failure and stage IV CKD.  Echocardiogram performed 09/29/2018 showed an ejection fraction of 30 to 35% mildly  dilated ventricle and filling pattern with pseudo-normalized consistent with decompensated heart failure.  There is mild right ventricular dysfunction mild left and right atrial enlargement she had a large left pleural effusion and a trivial pericardial effusion.  Aortic valve was calcified with mild aortic regurgitation and no stenosis.  Her previous ejection fraction 04/29/2017 was normal 60 to 65%.  Her admission to the hospital was preceded by an admission with Enterobacter sepsis complicated with delirium pancreatitis and anemia requiring transfusion.  She was also found to have severe pulmonary artery hypertension.  She maintained sinus rhythm throughout that hospitalization and had bradycardia causing discontinuation of the beta-blocker.  Echocardiogram performed last 12/23/2018 at [facility] showed ejection fraction 50 to 55% She was last seen 05/16/2019. Compliance with diet, lifestyle and medications: Yes  I do not have records but she tells me she just received 2 units of packed cells at the hospital last week for anemia. She thinks she had full lab work done and will try to access this as she is on amiodarone and has CKD. Her weight is up in the range of 15 pounds she is edematous she is not taking her diuretic except an isolated instances but fortunately she is not short of breath. She is very sedentary stays inside of her home but has no orthopnea chest pain palpitation or syncope. Today she is in sinus rhythm. Past Medical History:  Diagnosis Date  . Acute on  chronic anemia 09/07/2018  . Bladder problem   . CKD (chronic kidney disease), stage III   . Diabetes mellitus ([place])   . Dyslipidemia   . Hypertension   . Paroxysmal A-fib ([place])   . Paroxysmal atrial flutter ([place])   . Sinus bradycardia     Past Surgical History:  Procedure Laterality Date  . PARTIAL HYSTERECTOMY    . TOE SURGERY    . TONSILLECTOMY      Current Medications: Current Meds  Medication Sig  .  atorvastatin (LIPITOR) 10 MG tablet Take 1 tablet (10 mg total) by mouth daily.  . Calcium Carbonate-Vitamin D (CALCIUM-D) 600-400 MG-UNIT TABS Take 1 tablet by mouth 2 (two) times daily.  . furosemide (LASIX) 40 MG tablet Take 40 mg by mouth daily.   . hydrALAZINE (APRESOLINE) 25 MG tablet Take 0.5 tablets (12.5 mg total) by mouth 3 (three) times daily.  . isosorbide dinitrate (ISORDIL) 10 MG tablet Take 1 tablet (10 mg total) by mouth 3 (three) times daily.  Marland Kitchen levothyroxine (SYNTHROID) 75 MCG tablet Take 1 tablet (75 mcg total) by mouth daily before breakfast.  . Multiple Vitamin (MULTI-VITAMINS) TABS Take 1 tablet by mouth daily.   . polyethylene glycol (MIRALAX / GLYCOLAX) 17 g packet Take 17 g by mouth daily. For constipation.     Allergies:   Vancomycin   Social History   Socioeconomic History  . Marital status: Widowed    Spouse name: Not on file  . Number of children: Not on file  . Years of education: Not on file  . Highest education level: Not on file  Occupational History  . Not on file  Tobacco Use  . Smoking status: Never Smoker  . Smokeless tobacco: Never Used  Vaping Use  . Vaping Use: Never used  Substance and Sexual Activity  . Alcohol use: No  . Drug use: No  . Sexual activity: Not on file  Other Topics Concern  . Not on file  Social History Narrative  . Not on file   Social Determinants of Health   Financial Resource Strain:   . Difficulty of Paying Living Expenses:   Food Insecurity:   . Worried About Charity fundraiser in the Last Year:   . Arboriculturist in the Last Year:   Transportation Needs:   . Film/video editor (Medical):   Marland Kitchen Lack of Transportation (Non-Medical):   Physical Activity:   . Days of Exercise per Week:   . Minutes of Exercise per Session:   Stress:   . Feeling of Stress :   Social Connections:   . Frequency of Communication with Friends and Family:   . Frequency of Social Gatherings with Friends and Family:   .  Attends Religious Services:   . Active Member of Clubs or Organizations:   . Attends Archivist Meetings:   Marland Kitchen Marital Status:      Family History: The patient's family history includes Breast cancer in her sister; Cancer in her father. There is no history of Diabetes or Heart attack. ROS:   Please see the history of present illness.    All other systems reviewed and are negative.  EKGs/Labs/Other Studies Reviewed:    The following studies were reviewed today:  EKG:  EKG ordered today and personally reviewed.  The ekg ordered today demonstrates sinus rhythm nonspecific conduction delay left axis deviation  Recent Labs: 05/16/2019: ALT 12; BUN 22; Creatinine, Ser 1.50; NT-Pro BNP 3,973; Potassium 4.3; Sodium  140; TSH 0.748  Recent Lipid Panel    Component Value Date/Time   CHOL 151 05/16/2019 1519   TRIG 140 05/16/2019 1519   HDL 53 05/16/2019 1519   CHOLHDL 2.8 05/16/2019 1519   CHOLHDL 2.2 09/08/2018 0529   VLDL 7 09/08/2018 0529   LDLCALC 74 05/16/2019 1519    Physical Exam:    VS:  BP (!) 180/98 (BP Location: Right Arm, Patient Position: Sitting, Cuff Size: Normal)   Pulse 84   Ht 5\' 6"  (1.676 m)   Wt 154 lb (69.9 kg)   SpO2 96%   BMI 24.86 kg/m     Wt Readings from Last 3 Encounters:  11/28/19 154 lb (69.9 kg)  05/16/19 132 lb 9.6 oz (60.1 kg)  11/30/18 155 lb (70.3 kg)    Repeat blood pressure 162/80 GEN: She looks frail she has pallor of her skin well nourished, well developed in no acute distress HEENT: Normal NECK: Mild JVD; No carotid bruits LYMPHATICS: No lymphadenopathy CARDIAC: RRR, no murmurs, rubs, gallops RESPIRATORY:  Clear to auscultation without rales, wheezing or rhonchi  ABDOMEN: Soft, non-tender, non-distended MUSCULOSKELETAL: 3-4+ boggy lower extremity edema predominantly at the ankle edema; No deformity  SKIN: Warm and dry NEUROLOGIC:  Alert and oriented x 3 PSYCHIATRIC:  Normal affect    Signed, Shirlee More, MD    11/28/2019 4:29 PM    [facility]

## 2019-11-28 ENCOUNTER — Other Ambulatory Visit: Payer: Self-pay

## 2019-11-28 ENCOUNTER — Ambulatory Visit (INDEPENDENT_AMBULATORY_CARE_PROVIDER_SITE_OTHER): Payer: Medicare PPO | Admitting: Cardiology

## 2019-11-28 ENCOUNTER — Encounter: Payer: Self-pay | Admitting: Cardiology

## 2019-11-28 VITALS — BP 180/98 | HR 84 | Ht 66.0 in | Wt 154.0 lb

## 2019-11-28 DIAGNOSIS — I480 Paroxysmal atrial fibrillation: Secondary | ICD-10-CM

## 2019-11-28 DIAGNOSIS — Z79899 Other long term (current) drug therapy: Secondary | ICD-10-CM

## 2019-11-28 DIAGNOSIS — I5042 Chronic combined systolic (congestive) and diastolic (congestive) heart failure: Secondary | ICD-10-CM | POA: Diagnosis not present

## 2019-11-28 DIAGNOSIS — I110 Hypertensive heart disease with heart failure: Secondary | ICD-10-CM

## 2019-11-28 DIAGNOSIS — I428 Other cardiomyopathies: Secondary | ICD-10-CM

## 2019-11-28 DIAGNOSIS — Z7901 Long term (current) use of anticoagulants: Secondary | ICD-10-CM | POA: Diagnosis not present

## 2019-11-28 NOTE — Patient Instructions (Signed)
Medication Instructions:  Your physician has recommended you make the following change in your medication:  DECREASE: Furosemide 40 mg take one tablet by mouth daily on Monday, Wednesday, and Friday only. *If you need a refill on your cardiac medications before your next appointment, please call your pharmacy*   Lab Work: Your physician recommends that you return for lab work in: TODAY CBC, CMP, TSH, T3,T4 If you have labs (blood work) drawn today and your tests are completely normal, you will receive your results only by:  [place] (if you have [place]) OR  A paper copy in the mail If you have any lab test that is abnormal or we need to change your treatment, we will call you to review the results.   Testing/Procedures: None   Follow-Up: At [facility], you and your health needs are our priority.  As part of our continuing mission to provide you with exceptional heart care, we have created designated Provider Care Teams.  These Care Teams include your primary Cardiologist (physician) and Advanced Practice Providers (APPs -  Physician Assistants and Nurse Practitioners) who all work together to provide you with the care you need, when you need it.  We recommend signing up for the patient portal called "MyChart".  Sign up information is provided on this After Visit Summary.  MyChart is used to connect with patients for Virtual Visits (Telemedicine).  Patients are able to view lab/test results, encounter notes, upcoming appointments, etc.  Non-urgent messages can be sent to your provider as well.   To learn more about what you can do with MyChart, go to [email or web address].    Your next appointment:   6 month(s)  The format for your next appointment:   In Person  Provider:   Shirlee More, MD   Other Instructions

## 2019-11-29 ENCOUNTER — Telehealth: Payer: Self-pay

## 2019-11-29 LAB — TSH+T4F+T3FREE
Free T4: 1.35 ng/dL (ref 0.82–1.77)
T3, Free: 1.5 pg/mL — ABNORMAL LOW (ref 2.0–4.4)
TSH: 2.26 u[IU]/mL (ref 0.450–4.500)

## 2019-11-29 LAB — COMPREHENSIVE METABOLIC PANEL
ALT: 9 IU/L (ref 0–32)
AST: 13 IU/L (ref 0–40)
Albumin/Globulin Ratio: 1 — ABNORMAL LOW (ref 1.2–2.2)
Albumin: 3.2 g/dL — ABNORMAL LOW (ref 3.6–4.6)
Alkaline Phosphatase: 103 IU/L (ref 48–121)
BUN/Creatinine Ratio: 14 (ref 12–28)
BUN: 28 mg/dL — ABNORMAL HIGH (ref 8–27)
Bilirubin Total: 0.3 mg/dL (ref 0.0–1.2)
CO2: 22 mmol/L (ref 20–29)
Calcium: 8.7 mg/dL (ref 8.7–10.3)
Chloride: 100 mmol/L (ref 96–106)
Creatinine, Ser: 1.95 mg/dL — ABNORMAL HIGH (ref 0.57–1.00)
GFR calc Af Amer: 26 mL/min/{1.73_m2} — ABNORMAL LOW (ref >59)
GFR calc non Af Amer: 23 mL/min/{1.73_m2} — ABNORMAL LOW (ref >59)
Globulin, Total: 3.1 g/dL (ref 1.5–4.5)
Glucose: 82 mg/dL (ref 65–99)
Potassium: 4.5 mmol/L (ref 3.5–5.2)
Sodium: 134 mmol/L (ref 134–144)
Total Protein: 6.3 g/dL (ref 6.0–8.5)

## 2019-11-29 LAB — CBC
Hematocrit: 29.7 % — ABNORMAL LOW (ref 34.0–46.6)
Hemoglobin: 9.4 g/dL — ABNORMAL LOW (ref 11.1–15.9)
MCH: 28.7 pg (ref 26.6–33.0)
MCHC: 31.6 g/dL (ref 31.5–35.7)
MCV: 91 fL (ref 79–97)
Platelets: 294 10*3/uL (ref 150–450)
RBC: 3.27 x10E6/uL — ABNORMAL LOW (ref 3.77–5.28)
RDW: 13.1 % (ref 11.7–15.4)
WBC: 5.8 10*3/uL (ref 3.4–10.8)

## 2019-11-29 NOTE — Telephone Encounter (Signed)
Left message on patients voicemail to please return our call.   

## 2019-11-29 NOTE — Telephone Encounter (Signed)
-----   Message from Richardo Priest, MD sent at 11/29/2019  7:59 AM EDT ----- Normal or stable result  No changes

## 2019-11-30 NOTE — Telephone Encounter (Signed)
Spoke with patient regarding results and recommendation.  Patient verbalizes understanding and is agreeable to plan of care. Advised patient to call back with any issues or concerns.  

## 2019-12-11 DIAGNOSIS — Z139 Encounter for screening, unspecified: Secondary | ICD-10-CM | POA: Diagnosis not present

## 2019-12-11 DIAGNOSIS — Z9181 History of falling: Secondary | ICD-10-CM | POA: Diagnosis not present

## 2019-12-11 DIAGNOSIS — E785 Hyperlipidemia, unspecified: Secondary | ICD-10-CM | POA: Diagnosis not present

## 2019-12-11 DIAGNOSIS — Z1331 Encounter for screening for depression: Secondary | ICD-10-CM | POA: Diagnosis not present

## 2019-12-11 DIAGNOSIS — Z0000 Encounter for general adult medical examination without abnormal findings: Secondary | ICD-10-CM | POA: Diagnosis not present

## 2019-12-13 DIAGNOSIS — N189 Chronic kidney disease, unspecified: Secondary | ICD-10-CM | POA: Diagnosis not present

## 2019-12-13 DIAGNOSIS — D631 Anemia in chronic kidney disease: Secondary | ICD-10-CM | POA: Diagnosis not present

## 2019-12-20 ENCOUNTER — Ambulatory Visit (INDEPENDENT_AMBULATORY_CARE_PROVIDER_SITE_OTHER): Payer: Medicare PPO | Admitting: Sports Medicine

## 2019-12-20 ENCOUNTER — Encounter: Payer: Self-pay | Admitting: Sports Medicine

## 2019-12-20 ENCOUNTER — Other Ambulatory Visit: Payer: Self-pay

## 2019-12-20 DIAGNOSIS — E039 Hypothyroidism, unspecified: Secondary | ICD-10-CM | POA: Diagnosis not present

## 2019-12-20 DIAGNOSIS — D638 Anemia in other chronic diseases classified elsewhere: Secondary | ICD-10-CM | POA: Diagnosis not present

## 2019-12-20 DIAGNOSIS — E1142 Type 2 diabetes mellitus with diabetic polyneuropathy: Secondary | ICD-10-CM | POA: Diagnosis not present

## 2019-12-20 DIAGNOSIS — B351 Tinea unguium: Secondary | ICD-10-CM | POA: Diagnosis not present

## 2019-12-20 DIAGNOSIS — I110 Hypertensive heart disease with heart failure: Secondary | ICD-10-CM | POA: Diagnosis not present

## 2019-12-20 DIAGNOSIS — M79675 Pain in left toe(s): Secondary | ICD-10-CM

## 2019-12-20 DIAGNOSIS — E1140 Type 2 diabetes mellitus with diabetic neuropathy, unspecified: Secondary | ICD-10-CM

## 2019-12-20 DIAGNOSIS — N184 Chronic kidney disease, stage 4 (severe): Secondary | ICD-10-CM | POA: Diagnosis not present

## 2019-12-20 DIAGNOSIS — I5040 Unspecified combined systolic (congestive) and diastolic (congestive) heart failure: Secondary | ICD-10-CM | POA: Diagnosis not present

## 2019-12-20 DIAGNOSIS — Z6826 Body mass index (BMI) 26.0-26.9, adult: Secondary | ICD-10-CM | POA: Diagnosis not present

## 2019-12-20 DIAGNOSIS — I482 Chronic atrial fibrillation, unspecified: Secondary | ICD-10-CM | POA: Diagnosis not present

## 2019-12-20 DIAGNOSIS — M79674 Pain in right toe(s): Secondary | ICD-10-CM

## 2019-12-20 DIAGNOSIS — E785 Hyperlipidemia, unspecified: Secondary | ICD-10-CM | POA: Diagnosis not present

## 2019-12-20 DIAGNOSIS — L84 Corns and callosities: Secondary | ICD-10-CM

## 2019-12-20 NOTE — Progress Notes (Signed)
Subjective: Maureen Stout is a 86 y.o. female patient with history of diabetes who returns to office today for diabetic nail and callus care. Patient reports that she is doing good but has some bleeding from her left foot.  FBS not recorded.   Objective: General: Patient is awake, alert, and oriented x 3 and in no acute distress.  Integument: Skin is warm, dry and supple bilateral. Nails are elongated thickened and dystrophic with subungual debris, consistent with onychomycosis, 1-5 on right 1,2,4,5 on left.  Callus with pinpoint bleeding noted to the left plantar forefoot submet 1.  No signs of infection.    Vasculature:  Dorsalis Pedis pulse 1/4 bilateral. Posterior Tibial pulse  1/4 bilateral. Capillary fill time <3 sec 1-5 bilateral. Scant hair growth to the level of the digits.Temperature gradient within normal limits. Mild varicosities present bilateral. No edema present bilateral.   Neurology: The patient has absent sensation measured with a 5.07/10g Semmes Weinstein Monofilament at all pedal sites bilateral. Vibratory sensation absent bilateral with tuning fork. No Babinski sign present bilateral.   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 Asymptomatic bunion and hammertoe pedal deformities noted bilateral. Muscular strength 5/5 in all lower extremity muscular groups bilateral without pain on range of motion. No tenderness with calf compression bilateral.  Assessment and plan Problem List Items Addressed This Visit    None    Visit Diagnoses    Pain due to onychomycosis of toenails of both feet    -  Primary   Pre-ulcerative calluses       Type 2 diabetes, controlled, with neuropathy ([place])          -Examined patient. -Re-Discussed and educated patient on diabetic foot care, especially with  regards to the vascular, neurological and musculoskeletal systems.  -Mechanically debrided all nails x9 using sterile nail nipper without incident and smooth with rotary bur  -At no  additional charge mechanically debrided callus plantar left forefoot submet 1 using a sterile chisel blade without incident and applied silver name cream and Band-Aid and advised patient to refrain from walking barefoot because eventually she is going to make this area become an ulcer if she does not offload it properly -Return to office in 10-12 weeks for nail care or sooner if problems or issues arise -Patient advised to call the office if any problems or questions arise in the meantime.  Landis Martins, DPM

## 2020-01-12 ENCOUNTER — Telehealth: Payer: Self-pay | Admitting: Cardiology

## 2020-01-12 DIAGNOSIS — N189 Chronic kidney disease, unspecified: Secondary | ICD-10-CM | POA: Diagnosis not present

## 2020-01-12 DIAGNOSIS — D631 Anemia in chronic kidney disease: Secondary | ICD-10-CM | POA: Diagnosis not present

## 2020-01-12 MED ORDER — FUROSEMIDE 40 MG PO TABS: 40.0000 mg | ORAL_TABLET | ORAL | 3 refills | Status: DC

## 2020-01-12 NOTE — Telephone Encounter (Signed)
Dr. Bobby Rumpf called from the [facility], stating patient has gained 20lbs in the last 3 mos.  He will like to know what you would like to do.

## 2020-01-12 NOTE — Telephone Encounter (Signed)
Spoke to the patient just now and let her know Dr. Joya Gaskins recommendations. She verbalizes understanding and thanks me for the call back.

## 2020-01-12 NOTE — Telephone Encounter (Signed)
New message     *STAT* If patient is at the pharmacy, call can be transferred to refill team.   1. Which medications need to be refilled? (please list name of each medication and dose if known)  Lasix 40mg   2. Which pharmacy/location (including street and city if local pharmacy) is medication to be sent to? [facility]  3. Do they need a 30 day or 90 day supply? Pt has 2 pills left.  Need enough to last until her appt on thurs

## 2020-01-12 NOTE — Telephone Encounter (Signed)
He has a history of heart failure please ask her to take her diuretic every day force her into the schedule next week

## 2020-01-12 NOTE — Telephone Encounter (Signed)
Pt saw Dr. Bettina Gavia in July. Refilled for 90 days. Lasix, 40mg  q M,W,F.

## 2020-01-15 DIAGNOSIS — N189 Chronic kidney disease, unspecified: Secondary | ICD-10-CM | POA: Diagnosis not present

## 2020-01-15 DIAGNOSIS — D631 Anemia in chronic kidney disease: Secondary | ICD-10-CM | POA: Diagnosis not present

## 2020-01-17 DIAGNOSIS — N329 Bladder disorder, unspecified: Secondary | ICD-10-CM | POA: Insufficient documentation

## 2020-01-17 DIAGNOSIS — I4892 Unspecified atrial flutter: Secondary | ICD-10-CM | POA: Insufficient documentation

## 2020-01-17 DIAGNOSIS — E785 Hyperlipidemia, unspecified: Secondary | ICD-10-CM | POA: Insufficient documentation

## 2020-01-17 DIAGNOSIS — I10 Essential (primary) hypertension: Secondary | ICD-10-CM | POA: Insufficient documentation

## 2020-01-17 DIAGNOSIS — I480 Paroxysmal atrial fibrillation: Secondary | ICD-10-CM | POA: Insufficient documentation

## 2020-01-17 DIAGNOSIS — E119 Type 2 diabetes mellitus without complications: Secondary | ICD-10-CM | POA: Insufficient documentation

## 2020-01-17 NOTE — Progress Notes (Signed)
Cardiology Office Note:    Date:  01/18/2020   ID:  Maureen Stout, DOB 06/24/1931, MRN [id number]  PCP:  Nicoletta Dress, MD  Cardiologist:  Shirlee More, MD    Referring MD: Nicoletta Dress, MD    ASSESSMENT:    1. Paroxysmal atrial fibrillation (HCC)   2. On amiodarone therapy   3. Chronic anticoagulation   4. Hypertensive heart disease with chronic combined systolic and diastolic congestive heart failure ([place])   5. Anemia, unspecified type    PLAN:    In order of problems listed above:  1. Maintaining sinus rhythm continue low-dose amiodarone no evidence of toxicity 2. Continue reduced dose anticoagulant with age and renal function 3. Blood pressure is elevated heart failure decompensated and will switch from furosemide to torsemide to induce diuresis if refractory she will require admission to the hospital and IV diuretic and this should improve her blood pressure continue her hydralazine isosorbide 4. This is a big important comorbidity which is worsening her heart failure and being addressed by hematology.  Remains quite anemic 8.2 on recent labs performed at her visit 01/12/2020   Next appointment: 2 weeks with me   Medication Adjustments/Labs and Tests Ordered: Current medicines are reviewed at length with the patient today.  Concerns regarding medicines are outlined above.  No orders of the defined types were placed in this encounter.  No orders of the defined types were placed in this encounter.   Chief Complaint  Patient presents with  . Follow-up  . Congestive Heart Failure  . Atrial Fibrillation    History of Present Illness:    Maureen Stout is a 86 y.o. female with a hx of atrial fibrillation hypertension dyslipidemia chronic anticoagulation. She was admitted to  [facility] in May 2020 with heart failure and stage IV CKD.  Echocardiogram performed 09/29/2018 showed an ejection fraction of 30 to 35% mildly dilated  ventricle and filling pattern with pseudo-normalized consistent with decompensated heart failure.  There is mild right ventricular dysfunction mild left and right atrial enlargement she had a large left pleural effusion and a trivial pericardial effusion.  Aortic valve was calcified with mild aortic regurgitation and no stenosis.  Her previous ejection fraction 04/29/2017 was normal 60 to 65%.  Her admission to the hospital was preceded by an admission with Enterobacter sepsis complicated with delirium pancreatitis and anemia requiring transfusion.  She was also found to have severe pulmonary artery hypertension.  She maintained sinus rhythm throughout that hospitalization and had bradycardia causing discontinuation of the beta-blocker.  Echocardiogram performed last 12/23/2018 at [facility] showed ejection fraction 50 to 55%.  Last month she was transfused 2 units of packed cells at [facility].  She was last seen 11/28/2019.  Compliance with diet, lifestyle and medications: Yes  In general she is not doing well she has residual severe anemia recent hemoglobin was 8.2 has been followed by hematology.  With her edema she is gained in excess of 20 pounds she has marked peripheral edema and she is short of breath when she ambulates outside of her home.  Fortunately she has had no orthopnea.  No chest pain or palpitation she remains sinus rhythm.  She continues Eliquis reduced dose without bleeding and low-dose amiodarone.  Recent TSH on amiodarone was normal.  Seen by me today she is fluid overloaded and decompensated heart failure and despite the severity of her CKD stage IV approaching stage V with a creatinine of 2.4  GFR of 19 cc she needs to undergo diuresis.  I will switch her from furosemide to torsemide as she may not be responsive so double dose for 4 days drop back to 20/day call me if her weight is not down to 140 or less in the next week.  Weighs daily she has lost 4 pounds in the last  week with a note from oncology says she is up greater than 20 pounds. Past Medical History:  Diagnosis Date  . Abnormal CXR 04/13/2018  . Abnormality of gait 10/19/2018  . Acute blood loss anemia   . Acute combined systolic and diastolic congestive heart failure ([place])   . Acute on chronic anemia 09/07/2018  . Acute on chronic kidney failure ([place])   . Acute pancreatitis without infection or necrosis 09/07/2018  . Anemia of chronic disease   . Bacteremia 09/08/2018  . Bladder problem   . Bradycardia 09/07/2018  . Cardiomegaly   . Chronic anticoagulation 01/08/2018  . CKD (chronic kidney disease), stage III   . Debility 09/19/2018  . Diabetes mellitus ([place])   . Dyslipidemia   . Edema   . Esophageal dysmotility   . Failure to thrive (child)   . First degree AV block 09/06/2018  . Gross hematuria 07/05/2017  . Hypertension   . Hypertensive heart disease 01/08/2018  . Hypoglycemia without diagnosis of diabetes mellitus 09/08/2018  . Hypothyroidism 09/06/2018  . OAB (overactive bladder) 07/05/2017  . On amiodarone therapy 01/08/2018  . PAH (pulmonary artery hypertension) ([place])   . Paroxysmal A-fib ([place])   . Paroxysmal atrial fibrillation ([place]) 04/18/2017  . Paroxysmal atrial flutter ([place])   . Recurrent UTI   . Sepsis due to Enterobacter species ([place]) 09/08/2018  . Sinus bradycardia   . Urinary retention     Past Surgical History:  Procedure Laterality Date  . PARTIAL HYSTERECTOMY    . TOE SURGERY    . TONSILLECTOMY      Current Medications: Current Meds  Medication Sig  . amiodarone (PACERONE) 200 MG tablet Take 0.5 tablets (100 mg total) by mouth daily.  Marland Kitchen apixaban (ELIQUIS) 2.5 MG TABS tablet Take 1 tablet (2.5 mg total) by mouth 2 (two) times daily.  Marland Kitchen atorvastatin (LIPITOR) 10 MG tablet Take 1 tablet (10 mg total) by mouth daily.  . Calcium Carbonate-Vitamin D (CALCIUM-D) 600-400 MG-UNIT TABS Take 1 tablet by mouth 2 (two) times daily.  . furosemide (LASIX) 40 MG tablet Take 1  tablet (40 mg total) by mouth 3 (three) times a week. (Patient taking differently: Take 40 mg by mouth daily. )  . hydrALAZINE (APRESOLINE) 25 MG tablet Take 0.5 tablets (12.5 mg total) by mouth 3 (three) times daily.  . isosorbide dinitrate (ISORDIL) 10 MG tablet Take 1 tablet (10 mg total) by mouth 3 (three) times daily.  Marland Kitchen levothyroxine (SYNTHROID) 75 MCG tablet Take 1 tablet (75 mcg total) by mouth daily before breakfast.  . Multiple Vitamin (MULTI-VITAMINS) TABS Take 1 tablet by mouth daily.   . polyethylene glycol (MIRALAX / GLYCOLAX) 17 g packet Take 17 g by mouth daily. For constipation.     Allergies:   Vancomycin   Social History   Socioeconomic History  . Marital status: Widowed    Spouse name: Not on file  . Number of children: Not on file  . Years of education: Not on file  . Highest education level: Not on file  Occupational History  . Not on file  Tobacco Use  . Smoking status: Never Smoker  .  Smokeless tobacco: Never Used  Vaping Use  . Vaping Use: Never used  Substance and Sexual Activity  . Alcohol use: No  . Drug use: No  . Sexual activity: Not on file  Other Topics Concern  . Not on file  Social History Narrative  . Not on file   Social Determinants of Health   Financial Resource Strain:   . Difficulty of Paying Living Expenses: Not on file  Food Insecurity:   . Worried About Charity fundraiser in the Last Year: Not on file  . Ran Out of Food in the Last Year: Not on file  Transportation Needs:   . Lack of Transportation (Medical): Not on file  . Lack of Transportation (Non-Medical): Not on file  Physical Activity:   . Days of Exercise per Week: Not on file  . Minutes of Exercise per Session: Not on file  Stress:   . Feeling of Stress : Not on file  Social Connections:   . Frequency of Communication with Friends and Family: Not on file  . Frequency of Social Gatherings with Friends and Family: Not on file  . Attends Religious Services: Not on  file  . Active Member of Clubs or Organizations: Not on file  . Attends Archivist Meetings: Not on file  . Marital Status: Not on file     Family History: The patient's family history includes Breast cancer in her sister; Cancer in her father. There is no history of Diabetes or Heart attack. ROS:   Please see the history of present illness.    All other systems reviewed and are negative.  EKGs/Labs/Other Studies Reviewed:    The following studies were reviewed today:  EKG:  EKG ordered today and personally reviewed.  The ekg ordered today demonstrates sinus rhythm left axis deviation nonspecific conduction delay  Recent Labs: 05/16/2019: NT-Pro BNP 3,973 11/28/2019: ALT 9; BUN 28; Creatinine, Ser 1.95; Hemoglobin 9.4; Platelets 294; Potassium 4.5; Sodium 134; TSH 2.260  Recent Lipid Panel    Component Value Date/Time   CHOL 151 05/16/2019 1519   TRIG 140 05/16/2019 1519   HDL 53 05/16/2019 1519   CHOLHDL 2.8 05/16/2019 1519   CHOLHDL 2.2 09/08/2018 0529   VLDL 7 09/08/2018 0529   LDLCALC 74 05/16/2019 1519    Physical Exam:    VS:  Ht 5\' 6"  (1.676 m)   Wt 158 lb 3.2 oz (71.8 kg)   BMI 25.53 kg/m     Wt Readings from Last 3 Encounters:  01/18/20 158 lb 3.2 oz (71.8 kg)  11/28/19 154 lb (69.9 kg)  05/16/19 132 lb 9.6 oz (60.1 kg)     GEN:  Well nourished, well developed in no acute distress HEENT: Normal NECK: She has severe JVD; No carotid bruits LYMPHATICS: No lymphadenopathy CARDIAC: RRR, no murmurs, rubs, gallops RESPIRATORY:  Clear to auscultation without rales, wheezing or rhonchi  ABDOMEN: Soft, non-tender, non-distended MUSCULOSKELETAL: She has 4+ or greater bilateral pitting lower extremity edema; No deformity  SKIN: Warm and dry NEUROLOGIC:  Alert and oriented x 3 PSYCHIATRIC:  Normal affect    Signed, Shirlee More, MD  01/18/2020 11:40 AM    [place]

## 2020-01-18 ENCOUNTER — Encounter: Payer: Self-pay | Admitting: Cardiology

## 2020-01-18 ENCOUNTER — Ambulatory Visit (INDEPENDENT_AMBULATORY_CARE_PROVIDER_SITE_OTHER): Payer: Medicare PPO | Admitting: Cardiology

## 2020-01-18 ENCOUNTER — Other Ambulatory Visit: Payer: Self-pay

## 2020-01-18 VITALS — BP 163/91 | HR 89 | Ht 66.0 in | Wt 158.2 lb

## 2020-01-18 DIAGNOSIS — Z7901 Long term (current) use of anticoagulants: Secondary | ICD-10-CM

## 2020-01-18 DIAGNOSIS — D649 Anemia, unspecified: Secondary | ICD-10-CM | POA: Diagnosis not present

## 2020-01-18 DIAGNOSIS — I5042 Chronic combined systolic (congestive) and diastolic (congestive) heart failure: Secondary | ICD-10-CM | POA: Diagnosis not present

## 2020-01-18 DIAGNOSIS — Z79899 Other long term (current) drug therapy: Secondary | ICD-10-CM | POA: Diagnosis not present

## 2020-01-18 DIAGNOSIS — I110 Hypertensive heart disease with heart failure: Secondary | ICD-10-CM | POA: Diagnosis not present

## 2020-01-18 DIAGNOSIS — I480 Paroxysmal atrial fibrillation: Secondary | ICD-10-CM | POA: Diagnosis not present

## 2020-01-18 MED ORDER — TORSEMIDE 20 MG PO TABS: ORAL_TABLET | ORAL | 3 refills | Status: DC

## 2020-01-18 NOTE — Patient Instructions (Signed)
Medication Instructions:  Your physician has recommended you make the following change in your medication:  STOP: Furosemide START: Torsemide 20 mg take one tablet by mouth daily for three days. Then take one tablet by mouth daily.  *If you need a refill on your cardiac medications before your next appointment, please call your pharmacy*   Lab Work: None If you have labs (blood work) drawn today and your tests are completely normal, you will receive your results only by: Marland Kitchen MyChart Message (if you have MyChart) OR . A paper copy in the mail If you have any lab test that is abnormal or we need to change your treatment, we will call you to review the results.   Testing/Procedures: None   Follow-Up: At [facility], you and your health needs are our priority.  As part of our continuing mission to provide you with exceptional heart care, we have created designated Provider Care Teams.  These Care Teams include your primary Cardiologist (physician) and Advanced Practice Providers (APPs -  Physician Assistants and Nurse Practitioners) who all work together to provide you with the care you need, when you need it.  We recommend signing up for the patient portal called "MyChart".  Sign up information is provided on this After Visit Summary.  MyChart is used to connect with patients for Virtual Visits (Telemedicine).  Patients are able to view lab/test results, encounter notes, upcoming appointments, etc.  Non-urgent messages can be sent to your provider as well.   To learn more about what you can do with MyChart, go to [email or web address].    Your next appointment:   2 week(s)  The format for your next appointment:   In Person  Provider:   Shirlee More, MD   Other Instructions Please weigh yourself daily and keep a record of your weights in a notebook. If you still weigh more than 140 lbs in one week please call our office and let us know.

## 2020-02-02 ENCOUNTER — Encounter: Payer: Self-pay | Admitting: Cardiology

## 2020-02-02 ENCOUNTER — Ambulatory Visit (INDEPENDENT_AMBULATORY_CARE_PROVIDER_SITE_OTHER): Payer: Medicare PPO | Admitting: Cardiology

## 2020-02-02 ENCOUNTER — Other Ambulatory Visit: Payer: Self-pay

## 2020-02-02 VITALS — BP 147/78 | HR 83 | Ht 66.0 in | Wt 155.0 lb

## 2020-02-02 DIAGNOSIS — Z7901 Long term (current) use of anticoagulants: Secondary | ICD-10-CM

## 2020-02-02 DIAGNOSIS — I110 Hypertensive heart disease with heart failure: Secondary | ICD-10-CM | POA: Diagnosis not present

## 2020-02-02 DIAGNOSIS — R001 Bradycardia, unspecified: Secondary | ICD-10-CM | POA: Diagnosis not present

## 2020-02-02 DIAGNOSIS — I5042 Chronic combined systolic (congestive) and diastolic (congestive) heart failure: Secondary | ICD-10-CM

## 2020-02-02 DIAGNOSIS — I480 Paroxysmal atrial fibrillation: Secondary | ICD-10-CM | POA: Diagnosis not present

## 2020-02-02 DIAGNOSIS — Z79899 Other long term (current) drug therapy: Secondary | ICD-10-CM | POA: Diagnosis not present

## 2020-02-02 DIAGNOSIS — D649 Anemia, unspecified: Secondary | ICD-10-CM

## 2020-02-02 MED ORDER — TORSEMIDE 20 MG PO TABS: 20.0000 mg | ORAL_TABLET | ORAL | 3 refills | Status: AC

## 2020-02-02 NOTE — Progress Notes (Signed)
Cardiology Office Note:    Date:  02/02/2020   ID:  Maureen Stout, DOB July 15, 1931, MRN [id number]  PCP:  Maureen Dress, MD  Cardiologist:  Maureen More, MD    Referring MD: Maureen Dress, MD    ASSESSMENT:    1. Paroxysmal atrial fibrillation (HCC)   2. Sinus bradycardia   3. On amiodarone therapy   4. Chronic anticoagulation   5. Hypertensive heart disease with chronic combined systolic and diastolic congestive heart failure ([place])   6. Anemia, unspecified type    PLAN:    In order of problems listed above:  1. Atrial arrhythmia stable she is maintaining sinus rhythm on low-dose amiodarone with no evidence of toxicity.  Continue reduced dose anticoagulant with age and renal dysfunction she is in a good sinus rhythm without clinical bradycardia 2. BP at target improved continue her balance vasodilator hydralazine isosorbide along with her diuretic.  Comes obvious one of the problems here she is taking her diuretic 2 days a week and I negotiated with her to take it every other day and she has an appointment to be seen by nephrology next week.  If at all possible I had like to see her take her diuretic daily depending on renal function 3. Stable last hemoglobin greater than 9   Next appointment: 6 months   Medication Adjustments/Labs and Tests Ordered: Current medicines are reviewed at length with the patient today.  Concerns regarding medicines are outlined above.  Orders Placed This Encounter  Procedures  . EKG 12-Lead   No orders of the defined types were placed in this encounter.   No chief complaint on file.   History of Present Illness:    Maureen Stout is a 86 y.o. female with a hx of atrial fibrillation hypertension dyslipidemia chronic anticoagulation. She was admitted to  [facility] in May 2020 with heart failure and stage IV CKD.  Echocardiogram performed 09/29/2018 showed an ejection fraction of 30 to 35% mildly dilated  ventricle and filling pattern with pseudo-normalized consistent with decompensated heart failure.  There is mild right ventricular dysfunction mild left and right atrial enlargement she had a large left pleural effusion and a trivial pericardial effusion.  Aortic valve was calcified with mild aortic regurgitation and no stenosis.  Her previous ejection fraction 04/29/2017 was normal 60 to 65%.  Her admission to the hospital was preceded by an admission with Enterobacter sepsis complicated with delirium pancreatitis and anemia requiring transfusion.  She was also found to have severe pulmonary artery hypertension.  She maintained sinus rhythm throughout that hospitalization and had bradycardia causing discontinuation of the beta-blocker.  She was last seen 01/18/2020 with uncontrolled hypertension and decompensated heart failure well his anemia hemoglobin 8.2. Compliance with diet, lifestyle and medications: Yes  It becomes obvious she is taking her diuretic infrequently 1 to 2 days a week still has fluid overload rule out her blood pressure is at target and she is not short of breath she still has marked lower extremity edema.  No palpitation chest pain and no bleeding complication of her anticoagulant.  She is very hesitant to take her diuretic daily and I negotiated with her to take it Stout frequently she has arrangements to follow-up with her nephrologist. Past Medical History:  Diagnosis Date  . Abnormal CXR 04/13/2018  . Abnormality of gait 10/19/2018  . Acute blood loss anemia   . Acute combined systolic and diastolic congestive heart failure ([place])   .  Acute on chronic anemia 09/07/2018  . Acute on chronic kidney failure ([place])   . Acute pancreatitis without infection or necrosis 09/07/2018  . Anemia of chronic disease   . Bacteremia 09/08/2018  . Bladder problem   . Bradycardia 09/07/2018  . Cardiomegaly   . Chronic anticoagulation 01/08/2018  . CKD (chronic kidney disease), stage III   .  Debility 09/19/2018  . Diabetes mellitus ([place])   . Dyslipidemia   . Edema   . Esophageal dysmotility   . Failure to thrive (child)   . First degree AV block 09/06/2018  . Gross hematuria 07/05/2017  . Hypertension   . Hypertensive heart disease 01/08/2018  . Hypoglycemia without diagnosis of diabetes mellitus 09/08/2018  . Hypothyroidism 09/06/2018  . OAB (overactive bladder) 07/05/2017  . On amiodarone therapy 01/08/2018  . PAH (pulmonary artery hypertension) ([place])   . Paroxysmal A-fib ([place])   . Paroxysmal atrial fibrillation ([place]) 04/18/2017  . Paroxysmal atrial flutter ([place])   . Recurrent UTI   . Sepsis due to Enterobacter species ([place]) 09/08/2018  . Sinus bradycardia   . Urinary retention     Past Surgical History:  Procedure Laterality Date  . PARTIAL HYSTERECTOMY    . TOE SURGERY    . TONSILLECTOMY      Current Medications: Current Meds  Medication Sig  . amiodarone (PACERONE) 200 MG tablet Take 0.5 tablets (100 mg total) by mouth daily.  Marland Kitchen ampicillin (PRINCIPEN) 500 MG capsule Take 1 capsule by mouth daily.  Marland Kitchen apixaban (ELIQUIS) 2.5 MG TABS tablet Take 1 tablet (2.5 mg total) by mouth 2 (two) times daily.  Marland Kitchen atorvastatin (LIPITOR) 10 MG tablet Take 1 tablet (10 mg total) by mouth daily.  . Calcium Carbonate-Vitamin D (CALCIUM-D) 600-400 MG-UNIT TABS Take 1 tablet by mouth 2 (two) times daily.  . hydrALAZINE (APRESOLINE) 25 MG tablet Take 0.5 tablets (12.5 mg total) by mouth 3 (three) times daily.  . isosorbide dinitrate (ISORDIL) 10 MG tablet Take 1 tablet (10 mg total) by mouth 3 (three) times daily.  Marland Kitchen levothyroxine (SYNTHROID) 75 MCG tablet Take 1 tablet (75 mcg total) by mouth daily before breakfast.  . Multiple Vitamin (MULTI-VITAMINS) TABS Take 1 tablet by mouth daily.   . polyethylene glycol (MIRALAX / GLYCOLAX) 17 g packet Take 17 g by mouth daily. For constipation.  . [DISCONTINUED] torsemide (DEMADEX) 20 MG tablet Take one tablet by mouth twice daily for three days.  Then take one tablet by mouth daily.     Allergies:   Vancomycin   Social History   Socioeconomic History  . Marital status: Widowed    Spouse name: Not on file  . Number of children: Not on file  . Years of education: Not on file  . Highest education level: Not on file  Occupational History  . Not on file  Tobacco Use  . Smoking status: Never Smoker  . Smokeless tobacco: Never Used  Vaping Use  . Vaping Use: Never used  Substance and Sexual Activity  . Alcohol use: No  . Drug use: No  . Sexual activity: Not on file  Other Topics Concern  . Not on file  Social History Narrative  . Not on file   Social Determinants of Health   Financial Resource Strain:   . Difficulty of Paying Living Expenses: Not on file  Food Insecurity:   . Worried About Charity fundraiser in the Last Year: Not on file  . Ran Out of Food in the Last  Year: Not on file  Transportation Needs:   . Lack of Transportation (Medical): Not on file  . Lack of Transportation (Non-Medical): Not on file  Physical Activity:   . Days of Exercise per Week: Not on file  . Minutes of Exercise per Session: Not on file  Stress:   . Feeling of Stress : Not on file  Social Connections:   . Frequency of Communication with Friends and Family: Not on file  . Frequency of Social Gatherings with Friends and Family: Not on file  . Attends Religious Services: Not on file  . Active Member of Clubs or Organizations: Not on file  . Attends Archivist Meetings: Not on file  . Marital Status: Not on file     Family History: The patient's family history includes Breast cancer in her sister; Cancer in her father. There is no history of Diabetes or Heart attack. ROS:   Please see the history of present illness.    All other systems reviewed and are negative.  EKGs/Labs/Other Studies Reviewed:    The following studies were reviewed today:  EKG:  EKG ordered today and personally reviewed.  The ekg ordered today  demonstrates sinus rhythm left anterior hemiblock 1 PVC  Recent Labs: 05/16/2019: NT-Pro BNP 3,973 11/28/2019: ALT 9; BUN 28; Creatinine, Ser 1.95; Hemoglobin 9.4; Platelets 294; Potassium 4.5; Sodium 134; TSH 2.260  Recent Lipid Panel    Component Value Date/Time   CHOL 151 05/16/2019 1519   TRIG 140 05/16/2019 1519   HDL 53 05/16/2019 1519   CHOLHDL 2.8 05/16/2019 1519   CHOLHDL 2.2 09/08/2018 0529   VLDL 7 09/08/2018 0529   LDLCALC 74 05/16/2019 1519    Physical Exam:    VS:  BP (!) 147/78   Pulse 83   Ht 5\' 6"  (1.676 m)   Wt 155 lb (70.3 kg)   SpO2 96%   BMI 25.02 kg/m     Wt Readings from Last 3 Encounters:  02/02/20 155 lb (70.3 kg)  01/18/20 158 lb 3.2 oz (71.8 kg)  11/28/19 154 lb (69.9 kg)     GEN: She appears better stronger well nourished, well developed in no acute distress HEENT: Normal NECK: No JVD; No carotid bruits LYMPHATICS: No lymphadenopathy CARDIAC: RRR, no murmurs, rubs, gallops RESPIRATORY:  Clear to auscultation without rales, wheezing or rhonchi  ABDOMEN: Soft, non-tender, non-distended MUSCULOSKELETAL: Still has dependent edema both lower extremities, pitting edema; No deformity  SKIN: Warm and dry NEUROLOGIC:  Alert and oriented x 3 PSYCHIATRIC:  Normal affect    Signed, Maureen More, MD  02/02/2020 3:07 PM    [facility]

## 2020-02-02 NOTE — Patient Instructions (Signed)
Medication Instructions:  Your physician has recommended you make the following change in your medication:  INCREASE: Torsemide 20 mg take one tablet by mouth every other day. *If you need a refill on your cardiac medications before your next appointment, please call your pharmacy*   Lab Work: None If you have labs (blood work) drawn today and your tests are completely normal, you will receive your results only by: Marland Kitchen MyChart Message (if you have MyChart) OR . A paper copy in the mail If you have any lab test that is abnormal or we need to change your treatment, we will call you to review the results.   Testing/Procedures: None   Follow-Up: At [facility], you and your health needs are our priority.  As part of our continuing mission to provide you with exceptional heart care, we have created designated Provider Care Teams.  These Care Teams include your primary Cardiologist (physician) and Advanced Practice Providers (APPs -  Physician Assistants and Nurse Practitioners) who all work together to provide you with the care you need, when you need it.  We recommend signing up for the patient portal called "MyChart".  Sign up information is provided on this After Visit Summary.  MyChart is used to connect with patients for Virtual Visits (Telemedicine).  Patients are able to view lab/test results, encounter notes, upcoming appointments, etc.  Non-urgent messages can be sent to your provider as well.   To learn more about what you can do with MyChart, go to [email or web address].    Your next appointment:   6 month(s)  The format for your next appointment:   In Person  Provider:   Shirlee More, MD   Other Instructions

## 2020-02-05 DIAGNOSIS — I129 Hypertensive chronic kidney disease with stage 1 through stage 4 chronic kidney disease, or unspecified chronic kidney disease: Secondary | ICD-10-CM | POA: Diagnosis not present

## 2020-02-05 DIAGNOSIS — N184 Chronic kidney disease, stage 4 (severe): Secondary | ICD-10-CM | POA: Diagnosis not present

## 2020-02-05 DIAGNOSIS — I5022 Chronic systolic (congestive) heart failure: Secondary | ICD-10-CM | POA: Diagnosis not present

## 2020-02-05 DIAGNOSIS — R809 Proteinuria, unspecified: Secondary | ICD-10-CM | POA: Diagnosis not present

## 2020-02-05 DIAGNOSIS — E1122 Type 2 diabetes mellitus with diabetic chronic kidney disease: Secondary | ICD-10-CM | POA: Diagnosis not present

## 2020-02-05 DIAGNOSIS — R55 Syncope and collapse: Secondary | ICD-10-CM | POA: Diagnosis not present

## 2020-02-05 DIAGNOSIS — R339 Retention of urine, unspecified: Secondary | ICD-10-CM | POA: Diagnosis not present

## 2020-02-08 ENCOUNTER — Other Ambulatory Visit: Payer: Self-pay | Admitting: Nephrology

## 2020-02-08 ENCOUNTER — Other Ambulatory Visit (HOSPITAL_COMMUNITY): Payer: Self-pay | Admitting: *Deleted

## 2020-02-08 DIAGNOSIS — N184 Chronic kidney disease, stage 4 (severe): Secondary | ICD-10-CM

## 2020-02-09 ENCOUNTER — Encounter (HOSPITAL_COMMUNITY): Payer: Medicare PPO

## 2020-02-13 ENCOUNTER — Ambulatory Visit
Admission: RE | Admit: 2020-02-13 | Discharge: 2020-02-13 | Disposition: A | Discharge status: Home / Self Care | Payer: Medicare PPO | Source: Ambulatory Visit | Attending: Nephrology | Admitting: Nephrology

## 2020-02-13 DIAGNOSIS — N184 Chronic kidney disease, stage 4 (severe): Secondary | ICD-10-CM

## 2020-02-13 DIAGNOSIS — N281 Cyst of kidney, acquired: Secondary | ICD-10-CM | POA: Diagnosis not present

## 2020-02-13 DIAGNOSIS — N189 Chronic kidney disease, unspecified: Secondary | ICD-10-CM | POA: Diagnosis not present

## 2020-02-13 DIAGNOSIS — I129 Hypertensive chronic kidney disease with stage 1 through stage 4 chronic kidney disease, or unspecified chronic kidney disease: Secondary | ICD-10-CM | POA: Diagnosis not present

## 2020-02-15 DIAGNOSIS — N189 Chronic kidney disease, unspecified: Secondary | ICD-10-CM | POA: Diagnosis not present

## 2020-02-15 DIAGNOSIS — D631 Anemia in chronic kidney disease: Secondary | ICD-10-CM | POA: Diagnosis not present

## 2020-02-16 DIAGNOSIS — N189 Chronic kidney disease, unspecified: Secondary | ICD-10-CM | POA: Diagnosis not present

## 2020-02-16 DIAGNOSIS — D631 Anemia in chronic kidney disease: Secondary | ICD-10-CM | POA: Diagnosis not present

## 2020-02-20 ENCOUNTER — Other Ambulatory Visit: Payer: Self-pay

## 2020-02-20 MED ORDER — AMIODARONE HCL 200 MG PO TABS
100.0000 mg | ORAL_TABLET | Freq: Every day | ORAL | 3 refills | Status: DC
Start: 2020-02-20 — End: 2021-01-14

## 2020-02-20 NOTE — Telephone Encounter (Signed)
Pt's medication was resent to pt's preferred pharmacy. Confirmation received.

## 2020-03-11 DIAGNOSIS — D631 Anemia in chronic kidney disease: Secondary | ICD-10-CM | POA: Insufficient documentation

## 2020-03-12 ENCOUNTER — Other Ambulatory Visit: Payer: Self-pay | Admitting: Hematology and Oncology

## 2020-03-12 DIAGNOSIS — D631 Anemia in chronic kidney disease: Secondary | ICD-10-CM | POA: Diagnosis not present

## 2020-03-12 DIAGNOSIS — N189 Chronic kidney disease, unspecified: Secondary | ICD-10-CM | POA: Diagnosis not present

## 2020-03-12 LAB — CBC: RBC: 3.15 — AB (ref 3.87–5.11)

## 2020-03-12 LAB — CBC AND DIFFERENTIAL
HCT: 29 — AB (ref 36–46)
Hemoglobin: 9.4 — AB (ref 12.0–16.0)
Platelets: 203 (ref 150–399)
WBC: 4.6

## 2020-03-14 ENCOUNTER — Inpatient Hospital Stay: Payer: Medicare PPO | Attending: Oncology

## 2020-03-14 ENCOUNTER — Other Ambulatory Visit: Payer: Self-pay

## 2020-03-14 VITALS — BP 139/66 | HR 72 | Temp 97.6°F | Resp 18 | Wt 146.5 lb

## 2020-03-14 DIAGNOSIS — D631 Anemia in chronic kidney disease: Secondary | ICD-10-CM | POA: Insufficient documentation

## 2020-03-14 DIAGNOSIS — N189 Chronic kidney disease, unspecified: Secondary | ICD-10-CM | POA: Insufficient documentation

## 2020-03-14 DIAGNOSIS — N184 Chronic kidney disease, stage 4 (severe): Secondary | ICD-10-CM

## 2020-03-14 MED ORDER — EPOETIN ALFA-EPBX 40000 UNIT/ML IJ SOLN
40000.0000 [IU] | Freq: Once | INTRAMUSCULAR | Status: AC
  Administered 2020-03-14: 40000 [IU] via SUBCUTANEOUS

## 2020-03-14 MED ORDER — EPOETIN ALFA-EPBX 40000 UNIT/ML IJ SOLN
INTRAMUSCULAR | Status: AC
  Filled 2020-03-14: qty 1

## 2020-03-14 NOTE — Progress Notes (Signed)
Pt stable at time of discharge. 

## 2020-03-14 NOTE — Patient Instructions (Signed)

## 2020-03-22 ENCOUNTER — Ambulatory Visit (INDEPENDENT_AMBULATORY_CARE_PROVIDER_SITE_OTHER): Payer: Medicare PPO | Admitting: Sports Medicine

## 2020-03-22 ENCOUNTER — Other Ambulatory Visit: Payer: Self-pay

## 2020-03-22 ENCOUNTER — Encounter: Payer: Self-pay | Admitting: Sports Medicine

## 2020-03-22 DIAGNOSIS — E1140 Type 2 diabetes mellitus with diabetic neuropathy, unspecified: Secondary | ICD-10-CM

## 2020-03-22 DIAGNOSIS — L84 Corns and callosities: Secondary | ICD-10-CM

## 2020-03-22 DIAGNOSIS — I482 Chronic atrial fibrillation, unspecified: Secondary | ICD-10-CM | POA: Diagnosis not present

## 2020-03-22 DIAGNOSIS — B351 Tinea unguium: Secondary | ICD-10-CM | POA: Diagnosis not present

## 2020-03-22 DIAGNOSIS — E1142 Type 2 diabetes mellitus with diabetic polyneuropathy: Secondary | ICD-10-CM | POA: Diagnosis not present

## 2020-03-22 DIAGNOSIS — M79674 Pain in right toe(s): Secondary | ICD-10-CM | POA: Diagnosis not present

## 2020-03-22 DIAGNOSIS — M79675 Pain in left toe(s): Secondary | ICD-10-CM

## 2020-03-22 DIAGNOSIS — I5040 Unspecified combined systolic (congestive) and diastolic (congestive) heart failure: Secondary | ICD-10-CM | POA: Diagnosis not present

## 2020-03-22 DIAGNOSIS — Z23 Encounter for immunization: Secondary | ICD-10-CM | POA: Diagnosis not present

## 2020-03-22 DIAGNOSIS — D638 Anemia in other chronic diseases classified elsewhere: Secondary | ICD-10-CM | POA: Diagnosis not present

## 2020-03-22 DIAGNOSIS — I110 Hypertensive heart disease with heart failure: Secondary | ICD-10-CM | POA: Diagnosis not present

## 2020-03-22 DIAGNOSIS — E785 Hyperlipidemia, unspecified: Secondary | ICD-10-CM | POA: Diagnosis not present

## 2020-03-22 DIAGNOSIS — N184 Chronic kidney disease, stage 4 (severe): Secondary | ICD-10-CM | POA: Diagnosis not present

## 2020-03-22 DIAGNOSIS — E039 Hypothyroidism, unspecified: Secondary | ICD-10-CM | POA: Diagnosis not present

## 2020-03-22 NOTE — Progress Notes (Signed)
Subjective: Maureen Stout is a 86 y.o. female patient with history of diabetes who returns to office today for diabetic nail and callus care. Patient reports that she had a fall and hit the door and hurt her head and had some bleeding from her left foot.  FBS not recorded.   Objective: General: Patient is awake, alert, and oriented x 3 and in no acute distress.  Integument: Skin is warm, dry and supple bilateral. Nails are elongated thickened and dystrophic with subungual debris, consistent with onychomycosis, 1-5 on right 1,2,4,5 on left.  Callus with pinpoint bleeding noted to the left plantar forefoot submet 1, measures 0.2x0.3cm, mild ecchymosis noted to dorsum to right foot,  No signs of infection.    Vasculature:  Dorsalis Pedis pulse 1/4 bilateral. Posterior Tibial pulse  1/4 bilateral. Capillary fill time <3 sec 1-5 bilateral. Scant hair growth to the level of the digits.Temperature gradient within normal limits. Mild varicosities present bilateral. No edema present bilateral.   Neurology: The patient has absent sensation measured with a 5.07/10g Semmes Weinstein Monofilament at all pedal sites bilateral. Vibratory sensation absent bilateral with tuning fork. No Babinski sign present bilateral.   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 Asymptomatic bunion and hammertoe pedal deformities noted bilateral. Muscular strength 5/5 in all lower extremity muscular groups bilateral without pain on range of motion. No tenderness with calf compression bilateral.  Assessment and plan Problem List Items Addressed This Visit    None    Visit Diagnoses    Pain due to onychomycosis of toenails of both feet    -  Primary   Pre-ulcerative calluses       Type 2 diabetes, controlled, with neuropathy ([place])          -Examined patient. -Re-Discussed and educated patient on diabetic foot care, especially with regards to the vascular, neurological and musculoskeletal systems.  -Mechanically  debrided all nails x9 using sterile nail nipper without incident and smooth with rotary bur  -At no additional charge mechanically debrided callus plantar left forefoot submet 1 using a sterile chisel blade without incident and applied iodosorb and Band-Aid and advised patient to refrain from walking barefoot like previous and to use house shoes -Return to office in 10-12 weeks for nail care or sooner if problems or issues arise -Patient advised to call the office if any problems or questions arise in the meantime.  Landis Martins, DPM

## 2020-03-27 DIAGNOSIS — R338 Other retention of urine: Secondary | ICD-10-CM | POA: Diagnosis not present

## 2020-03-27 DIAGNOSIS — R339 Retention of urine, unspecified: Secondary | ICD-10-CM | POA: Diagnosis not present

## 2020-03-30 IMAGING — CT CT ABDOMEN AND PELVIS WITHOUT CONTRAST
2 of 4 series · 16 of 46 positions shown, 18 images · non-contrast
Comparison: 09/07/2018

CLINICAL DATA: Pancreatitis. Sepsis, not responsive to antibiotics.
Evaluate for abscess.

EXAM:
CT ABDOMEN AND PELVIS WITHOUT CONTRAST
TECHNIQUE: Multidetector CT imaging of the abdomen and pelvis was performed
following the standard protocol without IV contrast.

[Series 3: abd/ pelvis 5.0 i30f 2 · axial · 0.91mm/px · z∈[+836,+1262]mm · 13 of 93 slices shown, 15 images]
[im 4/93  soft-tissue]
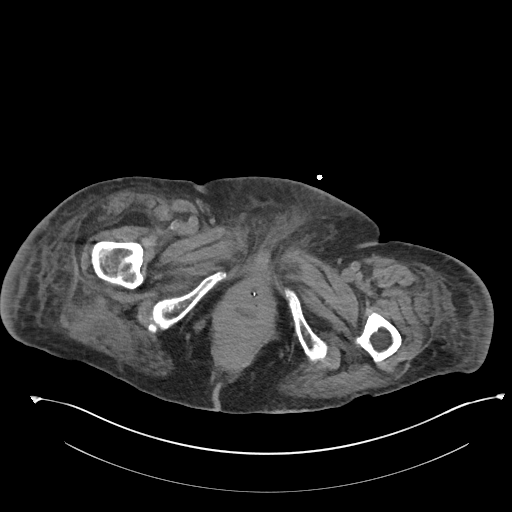
[im 4/93  bone]
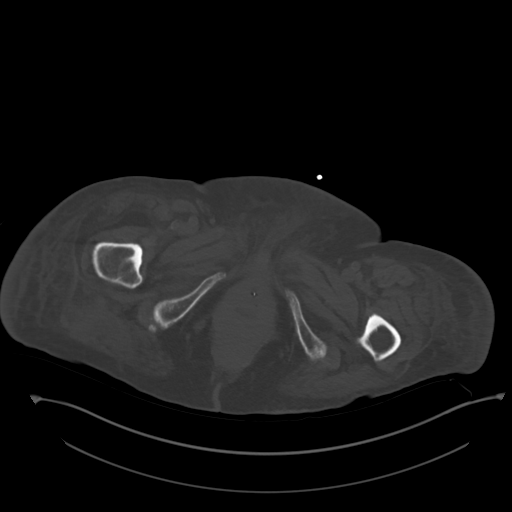
[im 12/93  soft-tissue]
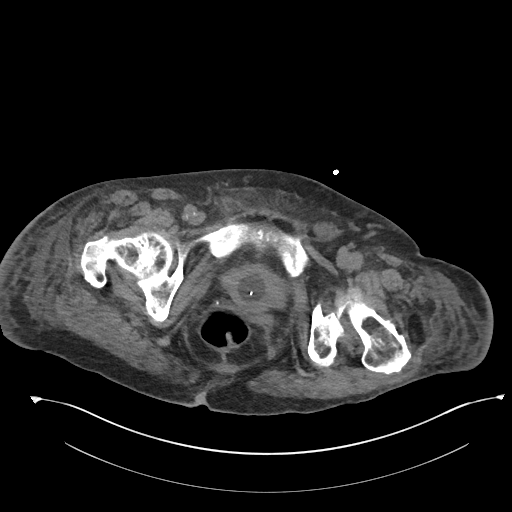
[im 19/93  soft-tissue]
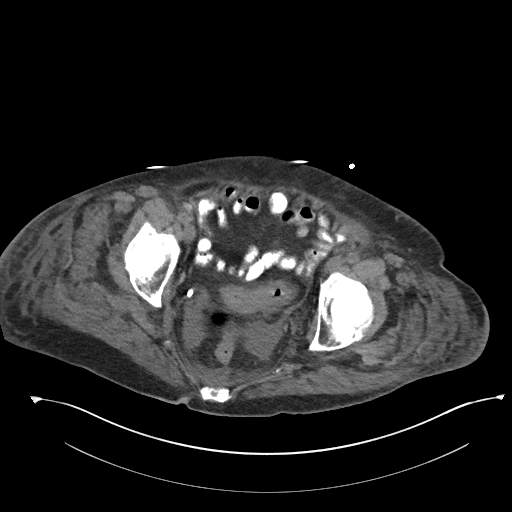
[im 26/93  soft-tissue]
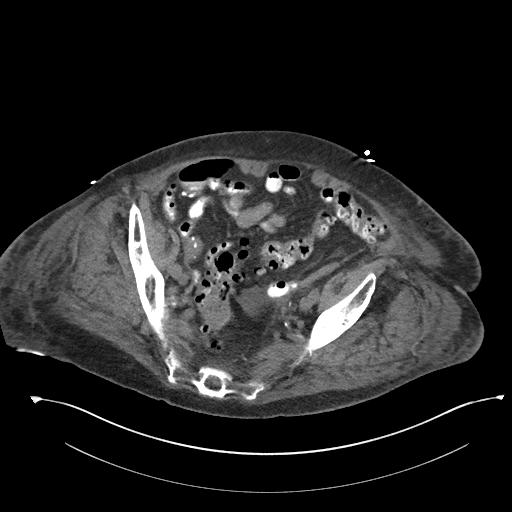
[im 34/93  soft-tissue]
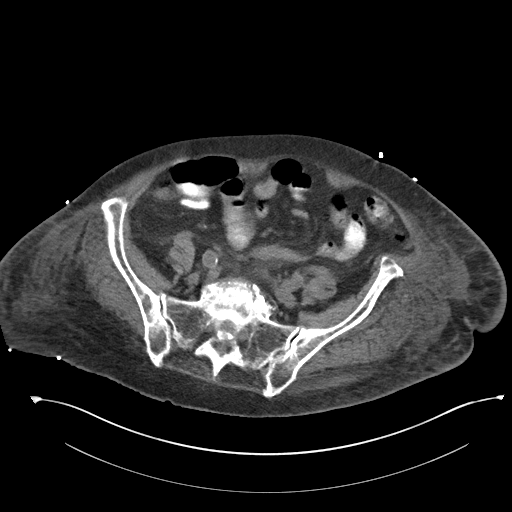
[im 41/93  soft-tissue]
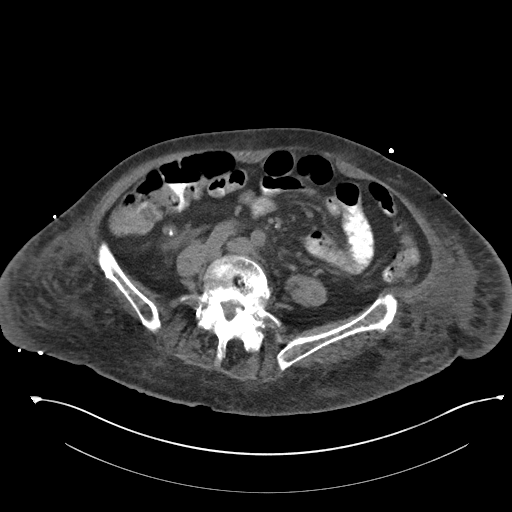
[im 48/93  soft-tissue]
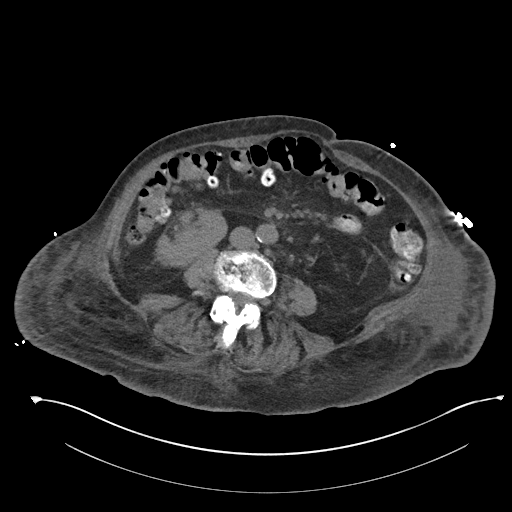
[im 52/93  soft-tissue]
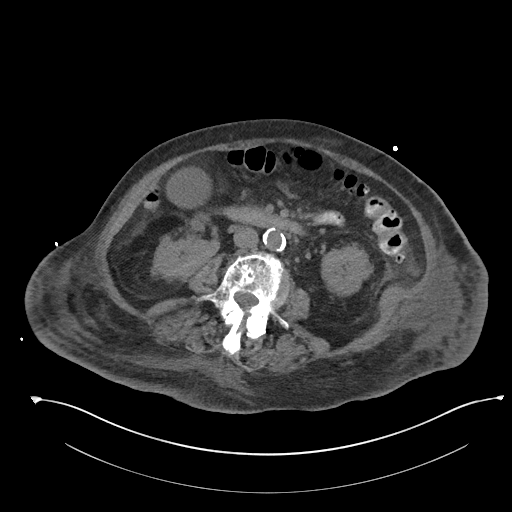
[im 59/93  soft-tissue]
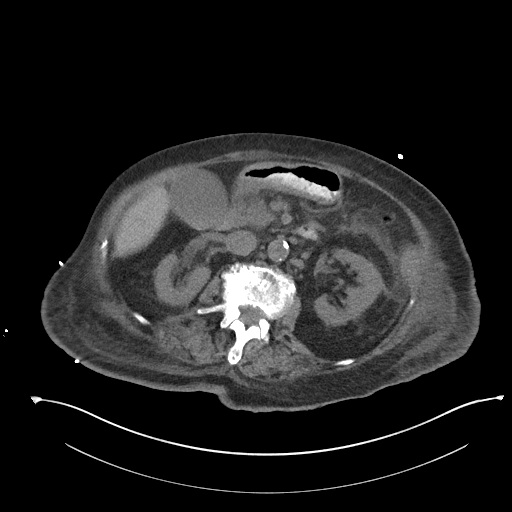
[im 59/93  bone]
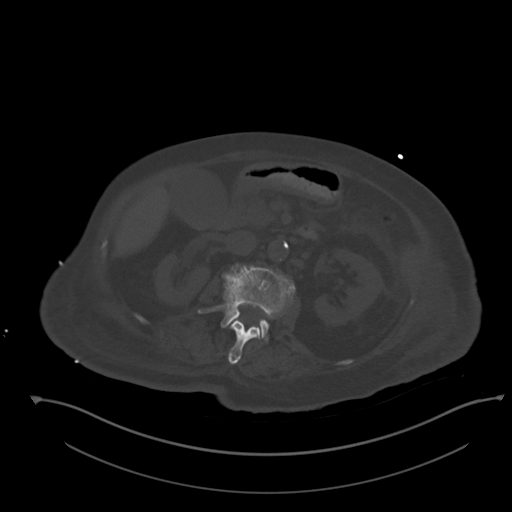
[im 67/93  soft-tissue]
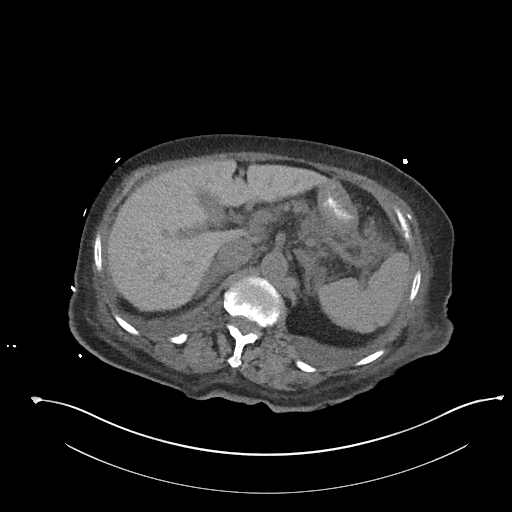
[im 74/93  soft-tissue]
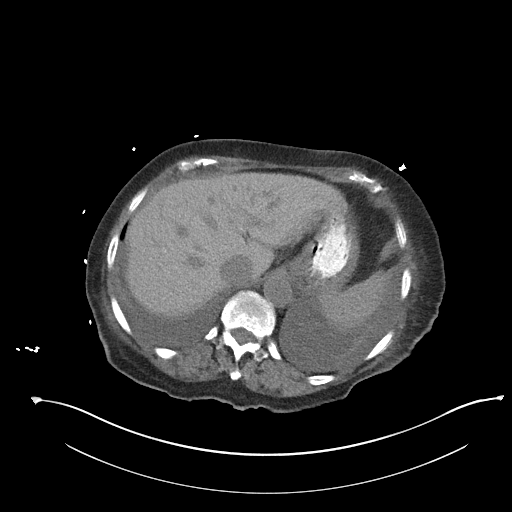
[im 81/93  soft-tissue]
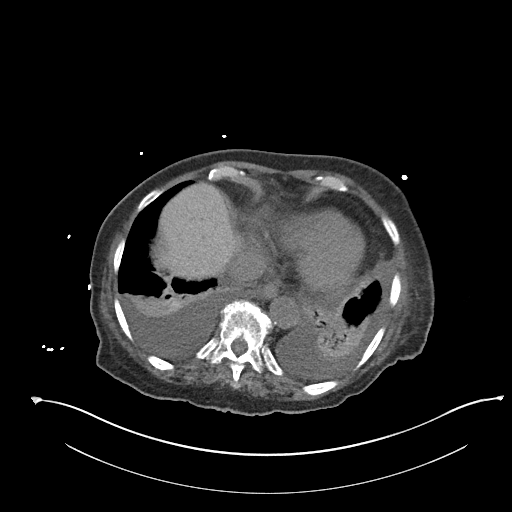
[im 89/93  soft-tissue]
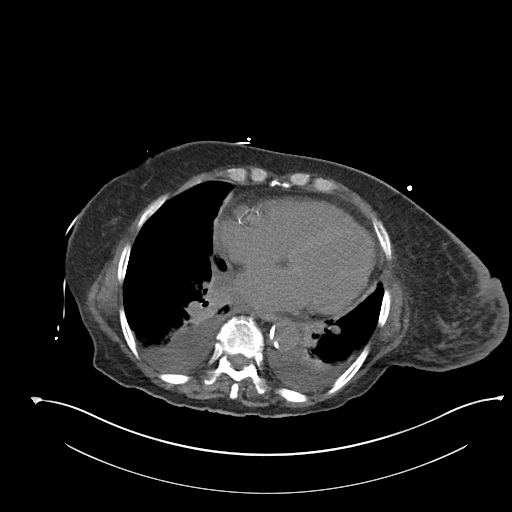

[Series 6: cor st · coronal · 0.85mm/px · 3 of 101 slices shown]
[im 34/101  soft-tissue]
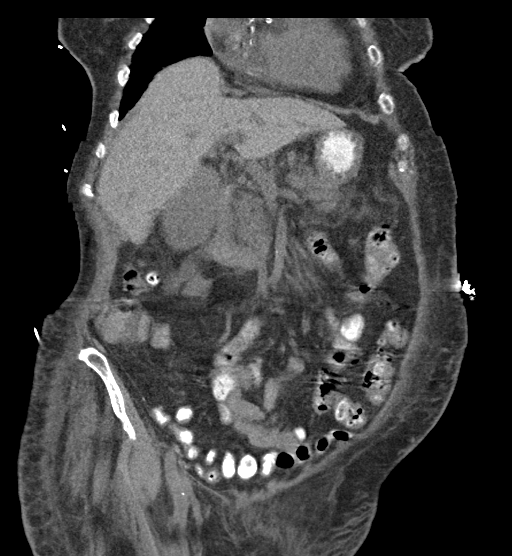
[im 45/101  soft-tissue]
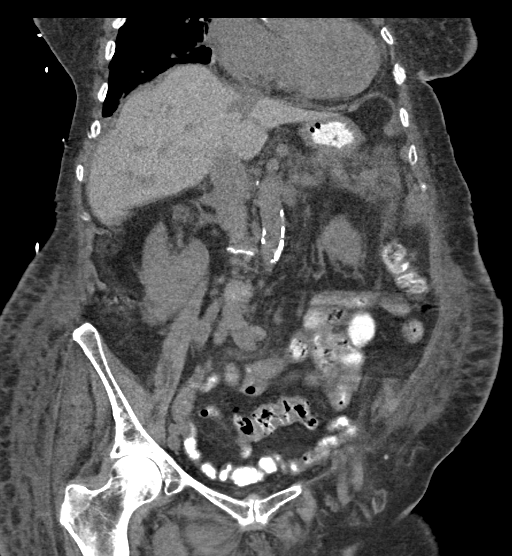
[im 56/101  soft-tissue]
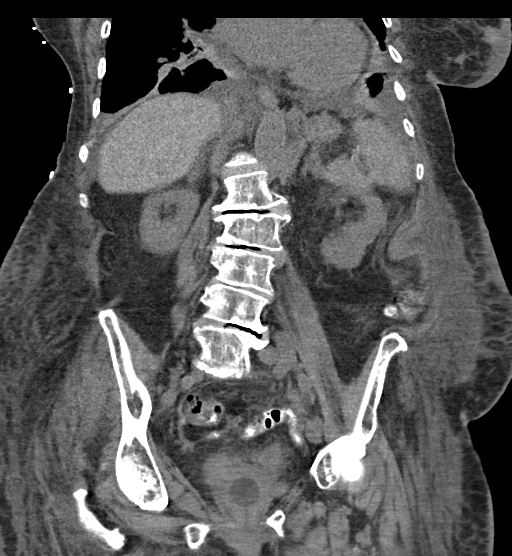

[16 of 46 positions shown; findings below may reference images not displayed]

FINDINGS: Lower chest: Small to moderate bilateral pleural effusions and
bibasilar atelectasis, mildly increased since previous study.

Hepatobiliary: No mass visualized on this unenhanced exam. A few
tiny calcified gallstones are again noted. No evidence of acute
cholecystitis or biliary ductal dilatation.

Pancreas: Swelling and soft tissue stranding involving the
pancreatic body and tail, consistent with acute pancreatitis. This
shows no significant change compared to recent study. No pseudocysts
identified.

Spleen:  Within normal limits in size.

Adrenals/Urinary tract: No evidence of urolithiasis or
hydronephrosis. Ptotic right kidney again noted. Foley catheter is
seen within urinary bladder, which is nearly empty.

Stomach/Bowel: No evidence of obstruction, inflammatory process, or
abnormal fluid collections. Diffuse colonic diverticulosis is noted,
however there is no evidence of diverticulitis.

Vascular/Lymphatic: No pathologically enlarged lymph nodes
identified. No evidence of abdominal aortic aneurysm. Aortic
atherosclerosis.

Reproductive: Prior hysterectomy noted. Adnexal regions are
unremarkable in appearance.

Other: Mild ascites shows no significant change. Increased diffuse
body wall edema noted.

Musculoskeletal:  No suspicious bone lesions identified.
IMPRESSION: 1. No significant change in mild acute pancreatitis. No pseudocysts
identified.
2. No other inflammatory process or abscess identified.
3. Increased diffuse body wall edema, bilateral pleural effusions,
and bibasilar atelectasis.
4. Colonic diverticulosis, without radiographic evidence of
diverticulitis.
5. Cholelithiasis. No radiographic evidence of cholecystitis.

Aortic Atherosclerosis ([id number]).

## 2020-04-10 DIAGNOSIS — N189 Chronic kidney disease, unspecified: Secondary | ICD-10-CM | POA: Diagnosis not present

## 2020-04-10 DIAGNOSIS — D631 Anemia in chronic kidney disease: Secondary | ICD-10-CM | POA: Diagnosis not present

## 2020-04-15 ENCOUNTER — Other Ambulatory Visit: Payer: Self-pay | Admitting: Oncology

## 2020-04-15 DIAGNOSIS — D631 Anemia in chronic kidney disease: Secondary | ICD-10-CM

## 2020-04-15 NOTE — Progress Notes (Signed)
[facility]  [address] [place],  [place]  [address] [phone]  Clinic Day:  04/16/2020  Referring physician: Nicoletta Dress, MD   HISTORY OF PRESENT ILLNESS:  The patient is a 86 y.o. female with anemia secondary to chronic renal insufficiency.  She has been receiving red cell shot therapy intermittently to get her hemoglobin closer to or above 10.  She comes in today to reassess her anemia.  Since her last visit, the patient has been doing okay.  Although she does have some fatigue, she denies having any overt forms of blood loss.  Of note, her nephrologist  recently arranged for her to receive IV iron over these next few weeks.  PHYSICAL EXAM:  Blood pressure (!) 160/75, pulse 70, temperature (!) 97.4 F (36.3 C), resp. rate 14, height 5\' 4"  (1.626 m), weight 146 lb 1.6 oz (66.3 kg), SpO2 98 %. Wt Readings from Last 3 Encounters:  04/16/20 146 lb 1.6 oz (66.3 kg)  03/14/20 146 lb 8 oz (66.5 kg)  02/02/20 155 lb (70.3 kg)   Body mass index is 25.08 kg/m. Performance status (ECOG): 2 Physical Exam Constitutional:      Appearance: Normal appearance. She is not ill-appearing.  HENT:     Mouth/Throat:     Mouth: Mucous membranes are moist.     Pharynx: Oropharynx is clear. No oropharyngeal exudate or posterior oropharyngeal erythema.  Cardiovascular:     Rate and Rhythm: Normal rate and regular rhythm.     Heart sounds: No murmur heard.  No friction rub. No gallop.   Pulmonary:     Effort: Pulmonary effort is normal. No respiratory distress.     Breath sounds: Normal breath sounds. No wheezing, rhonchi or rales.  Abdominal:     General: Bowel sounds are normal. There is no distension.     Palpations: Abdomen is soft. There is no mass.     Tenderness: There is no abdominal tenderness.  Musculoskeletal:        General: No swelling.     Right lower leg: No edema.     Left lower leg: No edema.  Lymphadenopathy:     Cervical: No  cervical adenopathy.     Upper Body:     Right upper body: No supraclavicular or axillary adenopathy.     Left upper body: No supraclavicular or axillary adenopathy.     Lower Body: No right inguinal adenopathy. No left inguinal adenopathy.  Skin:    General: Skin is warm.     Coloration: Skin is not jaundiced.     Findings: No lesion or rash.  Neurological:     General: No focal deficit present.     Mental Status: She is alert and oriented to person, place, and time. Mental status is at baseline.     Cranial Nerves: Cranial nerves are intact.  Psychiatric:        Mood and Affect: Mood normal.        Behavior: Behavior normal.        Thought Content: Thought content normal.     LABS:   CBC Latest Ref Rng & Units 04/16/2020 03/12/2020 11/28/2019  WBC - 5.4 4.6 5.8  Hemoglobin 12.0 - 16.0 8.4(A) 9.4(A) 9.4(L)  Hematocrit 36 - 46 26(A) 29(A) 29.7(L)  Platelets 150 - 399 234 203 294   CMP Latest Ref Rng & Units 04/16/2020 11/28/2019 05/16/2019  Glucose 65 - 99 mg/dL - 82 108(H)  BUN 4 - 21  39(A) 28(H) 22  Creatinine 0.5 - 1.1 2.0(A) 1.95(H) 1.50(H)  Sodium 137 - 147 136(A) 134 140  Potassium 3.4 - 5.3 3.8 4.5 4.3  Chloride 99 - 108 102 100 101  CO2 13 - 22 24(A) 22 25  Calcium 8.7 - 10.7 8.8 8.7 9.5  Total Protein 6.0 - 8.5 g/dL - 6.3 6.0  Total Bilirubin 0.0 - 1.2 mg/dL - 0.3 0.2  Alkaline Phos 25 - 125 127(A) 103 124(H)  AST 13 - 35 22 13 22   ALT 7 - 35 16 9 12      ASSESSMENT & PLAN:   Assessment/Plan:  A 86 y.o. female with anemia secondary to chronic renal insufifciency.  When evaluating her labs, her hemoglobin   remains well below 10.  Based upon this, I will get this patient back to receiving Retacrit in nctions on a monthly basis.  She will receive Retacrit 40,000 units monthly, with her CBC being checked on a monthly basis.  Although I doot have a problem with her receiving IV iron, previous iron studies in our clinic have never shown her to be iron deficient.   Furthermore, her hemoglobin is lower today than what it has been in the past, despite having received IV iron.  I will see this patient back in 3 months for repeat clinical assessment.  The patient understands all the plans discussed today and is in agreement with them.      Andreu Drudge Macarthur Critchley, MD

## 2020-04-16 ENCOUNTER — Inpatient Hospital Stay: Payer: Medicare PPO | Attending: Oncology

## 2020-04-16 ENCOUNTER — Inpatient Hospital Stay (INDEPENDENT_AMBULATORY_CARE_PROVIDER_SITE_OTHER): Payer: Medicare PPO | Admitting: Oncology

## 2020-04-16 ENCOUNTER — Encounter: Payer: Self-pay | Admitting: Oncology

## 2020-04-16 ENCOUNTER — Other Ambulatory Visit: Payer: Self-pay | Admitting: Oncology

## 2020-04-16 ENCOUNTER — Other Ambulatory Visit: Payer: Self-pay | Admitting: Hematology and Oncology

## 2020-04-16 VITALS — BP 160/75 | HR 70 | Temp 97.4°F | Resp 14 | Ht 64.0 in | Wt 146.1 lb

## 2020-04-16 DIAGNOSIS — N189 Chronic kidney disease, unspecified: Secondary | ICD-10-CM | POA: Diagnosis not present

## 2020-04-16 DIAGNOSIS — Z0001 Encounter for general adult medical examination with abnormal findings: Secondary | ICD-10-CM | POA: Diagnosis not present

## 2020-04-16 DIAGNOSIS — D631 Anemia in chronic kidney disease: Secondary | ICD-10-CM

## 2020-04-16 DIAGNOSIS — Z79899 Other long term (current) drug therapy: Secondary | ICD-10-CM | POA: Insufficient documentation

## 2020-04-16 DIAGNOSIS — N184 Chronic kidney disease, stage 4 (severe): Secondary | ICD-10-CM | POA: Diagnosis not present

## 2020-04-16 DIAGNOSIS — D649 Anemia, unspecified: Secondary | ICD-10-CM | POA: Diagnosis not present

## 2020-04-16 LAB — BASIC METABOLIC PANEL
BUN: 39 — AB (ref 4–21)
CO2: 24 — AB (ref 13–22)
Chloride: 102 (ref 99–108)
Creatinine: 2 — AB (ref 0.5–1.1)
Glucose: 161
Potassium: 3.8 (ref 3.4–5.3)
Sodium: 136 — AB (ref 137–147)

## 2020-04-16 LAB — HEPATIC FUNCTION PANEL
ALT: 16 (ref 7–35)
AST: 22 (ref 13–35)
Alkaline Phosphatase: 127 — AB (ref 25–125)
Bilirubin, Total: 0.4

## 2020-04-16 LAB — CBC AND DIFFERENTIAL
HCT: 26 — AB (ref 36–46)
Hemoglobin: 8.4 — AB (ref 12.0–16.0)
Neutrophils Absolute: 4.1
Platelets: 234 (ref 150–399)
WBC: 5.4

## 2020-04-16 LAB — IRON AND TIBC
Iron: 67 ug/dL (ref 28–170)
Saturation Ratios: 23 % (ref 10.4–31.8)
TIBC: 286 ug/dL (ref 250–450)
UIBC: 219 ug/dL

## 2020-04-16 LAB — CBC
MCV: 95 (ref 76–111)
RBC: 2.77 — AB (ref 3.87–5.11)

## 2020-04-16 LAB — CORRECTED CALCIUM (CC13): Calcium, Corrected: 9.4

## 2020-04-16 LAB — COMPREHENSIVE METABOLIC PANEL
Albumin: 3.4 — AB (ref 3.5–5.0)
Calcium: 8.8 (ref 8.7–10.7)

## 2020-04-16 LAB — FERRITIN: Ferritin: 358 ng/mL — ABNORMAL HIGH (ref 11–307)

## 2020-04-18 ENCOUNTER — Inpatient Hospital Stay: Payer: Medicare PPO | Attending: Oncology

## 2020-04-18 ENCOUNTER — Other Ambulatory Visit: Payer: Self-pay

## 2020-04-18 VITALS — BP 164/75 | HR 71 | Temp 97.7°F | Resp 20 | Ht 64.0 in | Wt 147.0 lb

## 2020-04-18 DIAGNOSIS — N189 Chronic kidney disease, unspecified: Secondary | ICD-10-CM | POA: Diagnosis not present

## 2020-04-18 DIAGNOSIS — D631 Anemia in chronic kidney disease: Secondary | ICD-10-CM | POA: Insufficient documentation

## 2020-04-18 MED ORDER — EPOETIN ALFA-EPBX 40000 UNIT/ML IJ SOLN
40000.0000 [IU] | Freq: Once | INTRAMUSCULAR | Status: AC
  Administered 2020-04-18: 40000 [IU] via SUBCUTANEOUS

## 2020-04-18 MED ORDER — EPOETIN ALFA-EPBX 40000 UNIT/ML IJ SOLN
INTRAMUSCULAR | Status: AC
  Filled 2020-04-18: qty 1

## 2020-04-18 NOTE — Patient Instructions (Signed)

## 2020-04-18 NOTE — Progress Notes (Signed)
PT STABLE AT TIME OF DISCHARGE 

## 2020-05-08 NOTE — Progress Notes (Signed)
PT STABLE AT TIME OF DISCHARGE 

## 2020-05-16 ENCOUNTER — Other Ambulatory Visit: Payer: Self-pay

## 2020-05-16 ENCOUNTER — Inpatient Hospital Stay: Payer: Medicare PPO

## 2020-05-16 ENCOUNTER — Other Ambulatory Visit: Payer: Self-pay | Admitting: Hematology and Oncology

## 2020-05-16 DIAGNOSIS — D649 Anemia, unspecified: Secondary | ICD-10-CM | POA: Diagnosis not present

## 2020-05-16 DIAGNOSIS — Z0001 Encounter for general adult medical examination with abnormal findings: Secondary | ICD-10-CM | POA: Diagnosis not present

## 2020-05-16 LAB — BASIC METABOLIC PANEL
BUN: 39 — AB (ref 4–21)
CO2: 27 — AB (ref 13–22)
Chloride: 103 (ref 99–108)
Creatinine: 2 — AB (ref 0.5–1.1)
Glucose: 116
Potassium: 4.2 (ref 3.4–5.3)
Sodium: 136 — AB (ref 137–147)

## 2020-05-16 LAB — COMPREHENSIVE METABOLIC PANEL
Albumin: 3.2 — AB (ref 3.5–5.0)
Calcium: 8.7 (ref 8.7–10.7)

## 2020-05-16 LAB — CBC AND DIFFERENTIAL
HCT: 29 — AB (ref 36–46)
Hemoglobin: 9.4 — AB (ref 12.0–16.0)
Neutrophils Absolute: 3.27
Platelets: 204 (ref 150–399)
WBC: 4.6

## 2020-05-16 LAB — HEPATIC FUNCTION PANEL
ALT: 14 (ref 7–35)
AST: 25 (ref 13–35)
Alkaline Phosphatase: 109 (ref 25–125)
Bilirubin, Total: 0.5

## 2020-05-16 LAB — CORRECTED CALCIUM (CC13): Calcium, Corrected: 9.5

## 2020-05-16 LAB — CBC
MCV: 99 (ref 81–99)
RBC: 2.96 — AB (ref 3.87–5.11)

## 2020-05-20 ENCOUNTER — Inpatient Hospital Stay: Payer: Medicare PPO | Attending: Oncology

## 2020-05-20 ENCOUNTER — Other Ambulatory Visit: Payer: Self-pay

## 2020-05-20 VITALS — BP 145/81 | HR 79 | Temp 98.2°F | Resp 18 | Ht 64.0 in | Wt 154.0 lb

## 2020-05-20 DIAGNOSIS — D631 Anemia in chronic kidney disease: Secondary | ICD-10-CM | POA: Diagnosis not present

## 2020-05-20 DIAGNOSIS — N189 Chronic kidney disease, unspecified: Secondary | ICD-10-CM | POA: Diagnosis not present

## 2020-05-20 DIAGNOSIS — N184 Chronic kidney disease, stage 4 (severe): Secondary | ICD-10-CM

## 2020-05-20 MED ORDER — EPOETIN ALFA-EPBX 40000 UNIT/ML IJ SOLN
INTRAMUSCULAR | Status: AC
  Filled 2020-05-20: qty 1

## 2020-05-20 MED ORDER — EPOETIN ALFA-EPBX 40000 UNIT/ML IJ SOLN
40000.0000 [IU] | Freq: Once | INTRAMUSCULAR | Status: AC
  Administered 2020-05-20: 40000 [IU] via SUBCUTANEOUS

## 2020-05-20 NOTE — Patient Instructions (Signed)

## 2020-06-17 ENCOUNTER — Other Ambulatory Visit: Payer: Self-pay

## 2020-06-17 ENCOUNTER — Other Ambulatory Visit: Payer: Self-pay | Admitting: Hematology and Oncology

## 2020-06-17 ENCOUNTER — Inpatient Hospital Stay: Payer: Medicare PPO

## 2020-06-17 DIAGNOSIS — D631 Anemia in chronic kidney disease: Secondary | ICD-10-CM | POA: Diagnosis not present

## 2020-06-17 DIAGNOSIS — D649 Anemia, unspecified: Secondary | ICD-10-CM | POA: Diagnosis not present

## 2020-06-17 LAB — CBC AND DIFFERENTIAL
HCT: 27 — AB (ref 36–46)
Hemoglobin: 8.8 — AB (ref 12.0–16.0)
Neutrophils Absolute: 2.77
Platelets: 200 (ref 150–399)
WBC: 4.2

## 2020-06-17 LAB — CBC
MCV: 97 (ref 81–99)
RBC: 2.83 — AB (ref 3.87–5.11)

## 2020-06-25 ENCOUNTER — Ambulatory Visit: Payer: Medicare PPO | Admitting: Sports Medicine

## 2020-06-28 ENCOUNTER — Encounter: Payer: Self-pay | Admitting: Sports Medicine

## 2020-06-28 ENCOUNTER — Ambulatory Visit (INDEPENDENT_AMBULATORY_CARE_PROVIDER_SITE_OTHER): Payer: Medicare PPO | Admitting: Sports Medicine

## 2020-06-28 ENCOUNTER — Other Ambulatory Visit: Payer: Self-pay

## 2020-06-28 DIAGNOSIS — M79675 Pain in left toe(s): Secondary | ICD-10-CM

## 2020-06-28 DIAGNOSIS — E1140 Type 2 diabetes mellitus with diabetic neuropathy, unspecified: Secondary | ICD-10-CM

## 2020-06-28 DIAGNOSIS — L84 Corns and callosities: Secondary | ICD-10-CM

## 2020-06-28 DIAGNOSIS — B351 Tinea unguium: Secondary | ICD-10-CM

## 2020-06-28 DIAGNOSIS — D638 Anemia in other chronic diseases classified elsewhere: Secondary | ICD-10-CM | POA: Diagnosis not present

## 2020-06-28 DIAGNOSIS — I110 Hypertensive heart disease with heart failure: Secondary | ICD-10-CM | POA: Diagnosis not present

## 2020-06-28 DIAGNOSIS — E785 Hyperlipidemia, unspecified: Secondary | ICD-10-CM | POA: Diagnosis not present

## 2020-06-28 DIAGNOSIS — M79674 Pain in right toe(s): Secondary | ICD-10-CM | POA: Diagnosis not present

## 2020-06-28 DIAGNOSIS — I5040 Unspecified combined systolic (congestive) and diastolic (congestive) heart failure: Secondary | ICD-10-CM | POA: Diagnosis not present

## 2020-06-28 DIAGNOSIS — E039 Hypothyroidism, unspecified: Secondary | ICD-10-CM | POA: Diagnosis not present

## 2020-06-28 DIAGNOSIS — I482 Chronic atrial fibrillation, unspecified: Secondary | ICD-10-CM | POA: Diagnosis not present

## 2020-06-28 DIAGNOSIS — E1142 Type 2 diabetes mellitus with diabetic polyneuropathy: Secondary | ICD-10-CM | POA: Diagnosis not present

## 2020-06-28 DIAGNOSIS — N184 Chronic kidney disease, stage 4 (severe): Secondary | ICD-10-CM | POA: Diagnosis not present

## 2020-06-28 NOTE — Progress Notes (Signed)
Subjective: Maureen Stout is a 87 y.o. female patient with history of diabetes who returns to office today for diabetic nail and callus care. Reports some bleeding for bottoms from both feet.   FBS not recorded.   Objective: General: Patient is awake, alert, and oriented x 3 and in no acute distress.  Integument: Skin is warm, dry and supple bilateral. Nails are elongated thickened and dystrophic with subungual debris, consistent with onychomycosis, 1-5 on right 1,2,4,5 on left.  Callus with pinpoint bleeding noted to the left plantar forefoot submet 1, measures 0.3x0.3cm, and submet 1 on right that measures 0.2x0.1cm,  No signs of infection.    Vasculature:  Dorsalis Pedis pulse 1/4 bilateral. Posterior Tibial pulse  1/4 bilateral. Capillary fill time <3 sec 1-5 bilateral. Scant hair growth to the level of the digits.Temperature gradient within normal limits. Mild varicosities present bilateral. No edema present bilateral.   Neurology: The patient has absent sensation measured with a 5.07/10g Semmes Weinstein Monofilament at all pedal sites bilateral. Vibratory sensation absent bilateral with tuning fork. No Babinski sign present bilateral.   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 Asymptomatic bunion and hammertoe pedal deformities noted bilateral. Muscular strength 5/5 in all lower extremity muscular groups bilateral without pain on range of motion. No tenderness with calf compression bilateral.  Assessment and plan Problem List Items Addressed This Visit   None   Visit Diagnoses    Pain due to onychomycosis of toenails of both feet    -  Primary   Relevant Medications   nitrofurantoin, macrocrystal-monohydrate, (MACROBID) 100 MG capsule   Pre-ulcerative calluses       Type 2 diabetes, controlled, with neuropathy ([place])          -Examined patient. -Re-Discussed and educated patient on diabetic foot care, especially with regards to the vascular, neurological and  musculoskeletal systems.  -Mechanically debrided all nails x9 using sterile nail nipper without incident and smooth with rotary bur  -At no additional charge mechanically debrided callus plantar left forefoot submet 1 using a sterile chisel blade without incident and applied betadine ointment and Band-Aid and advised patient to refrain from walking barefoot like previous and to use house shoes or tennis shoes -Return to office in 10-12 weeks for nail care or sooner if problems or issues arise -Patient advised to call the office if any problems or questions arise in the meantime.  Landis Martins, DPM

## 2020-07-01 ENCOUNTER — Telehealth: Payer: Self-pay

## 2020-07-01 NOTE — Telephone Encounter (Addendum)
I spoke with Tammy @ Dr Delena Bali office. I told her she needed to fax the labs to attn of Tammy, CMA of Dr Bobby Rumpf for review. I sent in basket note to [facility]    Voice message from [place] @ Dr Denton Lank office left on nurse triage line. They are requesting an appt for IV infusions. [phone].

## 2020-07-04 DIAGNOSIS — E7800 Pure hypercholesterolemia, unspecified: Secondary | ICD-10-CM | POA: Diagnosis not present

## 2020-07-04 DIAGNOSIS — N390 Urinary tract infection, site not specified: Secondary | ICD-10-CM | POA: Diagnosis not present

## 2020-07-04 DIAGNOSIS — J90 Pleural effusion, not elsewhere classified: Secondary | ICD-10-CM | POA: Diagnosis not present

## 2020-07-04 DIAGNOSIS — R0603 Acute respiratory distress: Secondary | ICD-10-CM | POA: Diagnosis not present

## 2020-07-04 DIAGNOSIS — I509 Heart failure, unspecified: Secondary | ICD-10-CM | POA: Diagnosis not present

## 2020-07-04 DIAGNOSIS — R0602 Shortness of breath: Secondary | ICD-10-CM | POA: Diagnosis not present

## 2020-07-04 DIAGNOSIS — N183 Chronic kidney disease, stage 3 unspecified: Secondary | ICD-10-CM | POA: Diagnosis not present

## 2020-07-04 DIAGNOSIS — I110 Hypertensive heart disease with heart failure: Secondary | ICD-10-CM | POA: Diagnosis not present

## 2020-07-04 DIAGNOSIS — J9811 Atelectasis: Secondary | ICD-10-CM | POA: Diagnosis not present

## 2020-07-04 DIAGNOSIS — I482 Chronic atrial fibrillation, unspecified: Secondary | ICD-10-CM | POA: Diagnosis not present

## 2020-07-04 DIAGNOSIS — A419 Sepsis, unspecified organism: Secondary | ICD-10-CM | POA: Diagnosis not present

## 2020-07-04 DIAGNOSIS — I5023 Acute on chronic systolic (congestive) heart failure: Secondary | ICD-10-CM | POA: Diagnosis not present

## 2020-07-04 DIAGNOSIS — R7881 Bacteremia: Secondary | ICD-10-CM | POA: Diagnosis not present

## 2020-07-04 DIAGNOSIS — I130 Hypertensive heart and chronic kidney disease with heart failure and stage 1 through stage 4 chronic kidney disease, or unspecified chronic kidney disease: Secondary | ICD-10-CM | POA: Diagnosis not present

## 2020-07-04 DIAGNOSIS — B9689 Other specified bacterial agents as the cause of diseases classified elsewhere: Secondary | ICD-10-CM | POA: Diagnosis not present

## 2020-07-04 DIAGNOSIS — I361 Nonrheumatic tricuspid (valve) insufficiency: Secondary | ICD-10-CM | POA: Diagnosis not present

## 2020-07-04 DIAGNOSIS — B9620 Unspecified Escherichia coli [E. coli] as the cause of diseases classified elsewhere: Secondary | ICD-10-CM | POA: Diagnosis not present

## 2020-07-04 DIAGNOSIS — I340 Nonrheumatic mitral (valve) insufficiency: Secondary | ICD-10-CM | POA: Diagnosis not present

## 2020-07-04 DIAGNOSIS — I517 Cardiomegaly: Secondary | ICD-10-CM | POA: Diagnosis not present

## 2020-07-10 DIAGNOSIS — E7800 Pure hypercholesterolemia, unspecified: Secondary | ICD-10-CM | POA: Diagnosis not present

## 2020-07-10 DIAGNOSIS — D631 Anemia in chronic kidney disease: Secondary | ICD-10-CM | POA: Diagnosis not present

## 2020-07-10 DIAGNOSIS — I509 Heart failure, unspecified: Secondary | ICD-10-CM | POA: Diagnosis not present

## 2020-07-10 DIAGNOSIS — I130 Hypertensive heart and chronic kidney disease with heart failure and stage 1 through stage 4 chronic kidney disease, or unspecified chronic kidney disease: Secondary | ICD-10-CM | POA: Diagnosis not present

## 2020-07-10 DIAGNOSIS — N184 Chronic kidney disease, stage 4 (severe): Secondary | ICD-10-CM | POA: Diagnosis not present

## 2020-07-10 DIAGNOSIS — M1990 Unspecified osteoarthritis, unspecified site: Secondary | ICD-10-CM | POA: Diagnosis not present

## 2020-07-10 DIAGNOSIS — N390 Urinary tract infection, site not specified: Secondary | ICD-10-CM | POA: Diagnosis not present

## 2020-07-10 DIAGNOSIS — M109 Gout, unspecified: Secondary | ICD-10-CM | POA: Diagnosis not present

## 2020-07-10 DIAGNOSIS — E1122 Type 2 diabetes mellitus with diabetic chronic kidney disease: Secondary | ICD-10-CM | POA: Diagnosis not present

## 2020-07-11 DIAGNOSIS — E7800 Pure hypercholesterolemia, unspecified: Secondary | ICD-10-CM | POA: Diagnosis not present

## 2020-07-11 DIAGNOSIS — D631 Anemia in chronic kidney disease: Secondary | ICD-10-CM | POA: Diagnosis not present

## 2020-07-11 DIAGNOSIS — N390 Urinary tract infection, site not specified: Secondary | ICD-10-CM | POA: Diagnosis not present

## 2020-07-11 DIAGNOSIS — I509 Heart failure, unspecified: Secondary | ICD-10-CM | POA: Diagnosis not present

## 2020-07-11 DIAGNOSIS — M1990 Unspecified osteoarthritis, unspecified site: Secondary | ICD-10-CM | POA: Diagnosis not present

## 2020-07-11 DIAGNOSIS — I130 Hypertensive heart and chronic kidney disease with heart failure and stage 1 through stage 4 chronic kidney disease, or unspecified chronic kidney disease: Secondary | ICD-10-CM | POA: Diagnosis not present

## 2020-07-11 DIAGNOSIS — N184 Chronic kidney disease, stage 4 (severe): Secondary | ICD-10-CM | POA: Diagnosis not present

## 2020-07-11 DIAGNOSIS — E1122 Type 2 diabetes mellitus with diabetic chronic kidney disease: Secondary | ICD-10-CM | POA: Diagnosis not present

## 2020-07-11 DIAGNOSIS — M109 Gout, unspecified: Secondary | ICD-10-CM | POA: Diagnosis not present

## 2020-07-14 DIAGNOSIS — D631 Anemia in chronic kidney disease: Secondary | ICD-10-CM | POA: Diagnosis not present

## 2020-07-14 DIAGNOSIS — N184 Chronic kidney disease, stage 4 (severe): Secondary | ICD-10-CM | POA: Diagnosis not present

## 2020-07-14 DIAGNOSIS — I509 Heart failure, unspecified: Secondary | ICD-10-CM | POA: Diagnosis not present

## 2020-07-14 DIAGNOSIS — E1122 Type 2 diabetes mellitus with diabetic chronic kidney disease: Secondary | ICD-10-CM | POA: Diagnosis not present

## 2020-07-14 DIAGNOSIS — M1990 Unspecified osteoarthritis, unspecified site: Secondary | ICD-10-CM | POA: Diagnosis not present

## 2020-07-14 DIAGNOSIS — M109 Gout, unspecified: Secondary | ICD-10-CM | POA: Diagnosis not present

## 2020-07-14 DIAGNOSIS — E7800 Pure hypercholesterolemia, unspecified: Secondary | ICD-10-CM | POA: Diagnosis not present

## 2020-07-14 DIAGNOSIS — N390 Urinary tract infection, site not specified: Secondary | ICD-10-CM | POA: Diagnosis not present

## 2020-07-14 DIAGNOSIS — I130 Hypertensive heart and chronic kidney disease with heart failure and stage 1 through stage 4 chronic kidney disease, or unspecified chronic kidney disease: Secondary | ICD-10-CM | POA: Diagnosis not present

## 2020-07-14 NOTE — Progress Notes (Signed)
[facility]  [address] [place],  [place]  [address] [phone]  Clinic Day:  07/15/2020  Referring physician: Nicoletta Dress, MD   HISTORY OF PRESENT ILLNESS:  The patient is a 87 y.o. female with anemia secondary to chronic renal insufficiency.  She has been receiving red cell shot therapy intermittently to get her hemoglobin closer to or above 10.  She comes in today to reassess her anemia.  Since her last visit, the patient has been doing okay.  Although she does have some fatigue, she denies having any overt forms of blood loss.    PHYSICAL EXAM:  Blood pressure 132/63, pulse 70, temperature (!) 97.4 F (36.3 C), resp. rate 14, height '5\' 4"'  (1.626 m), weight 151 lb 11.2 oz (68.8 kg), SpO2 96 %. Wt Readings from Last 3 Encounters:  07/17/20 149 lb (67.6 kg)  07/15/20 151 lb 11.2 oz (68.8 kg)  05/20/20 154 lb (69.9 kg)   Body mass index is 26.04 kg/m. Performance status (ECOG): 2 Physical Exam Constitutional:      Appearance: Normal appearance. She is not ill-appearing.  HENT:     Mouth/Throat:     Mouth: Mucous membranes are moist.     Pharynx: Oropharynx is clear. No oropharyngeal exudate or posterior oropharyngeal erythema.  Cardiovascular:     Rate and Rhythm: Normal rate and regular rhythm.     Heart sounds: No murmur heard. No friction rub. No gallop.   Pulmonary:     Effort: Pulmonary effort is normal. No respiratory distress.     Breath sounds: Normal breath sounds. No wheezing, rhonchi or rales.  Chest:  Breasts:     Right: No axillary adenopathy or supraclavicular adenopathy.     Left: No axillary adenopathy or supraclavicular adenopathy.    Abdominal:     General: Bowel sounds are normal. There is no distension.     Palpations: Abdomen is soft. There is no mass.     Tenderness: There is no abdominal tenderness.  Musculoskeletal:        General: No swelling.     Right lower leg: No edema.     Left lower leg: No  edema.  Lymphadenopathy:     Cervical: No cervical adenopathy.     Upper Body:     Right upper body: No supraclavicular or axillary adenopathy.     Left upper body: No supraclavicular or axillary adenopathy.     Lower Body: No right inguinal adenopathy. No left inguinal adenopathy.  Skin:    General: Skin is warm.     Coloration: Skin is not jaundiced.     Findings: No lesion or rash.  Neurological:     General: No focal deficit present.     Mental Status: She is alert and oriented to person, place, and time. Mental status is at baseline.     Cranial Nerves: Cranial nerves are intact.  Psychiatric:        Mood and Affect: Mood normal.        Behavior: Behavior normal.        Thought Content: Thought content normal.     LABS:   CBC Latest Ref Rng & Units 07/15/2020 06/17/2020 05/16/2020  WBC - 4.6 4.2 4.6  Hemoglobin 12.0 - 16.0 7.3(A) 8.8(A) 9.4(A)  Hematocrit 36 - 46 23(A) 27(A) 29(A)  Platelets 150 - 399 191 200 204   CMP Latest Ref Rng & Units 05/16/2020 04/16/2020 11/28/2019  Glucose 65 - 99 mg/dL - -  82  BUN 4 - 21 39(A) 39(A) 28(H)  Creatinine 0.5 - 1.1 2.0(A) 2.0(A) 1.95(H)  Sodium 137 - 147 136(A) 136(A) 134  Potassium 3.4 - 5.3 4.2 3.8 4.5  Chloride 99 - 108 103 102 100  CO2 13 - 22 27(A) 24(A) 22  Calcium 8.7 - 10.7 8.7 8.8 8.7  Total Protein 6.0 - 8.5 g/dL - - 6.3  Total Bilirubin 0.0 - 1.2 mg/dL - - 0.3  Alkaline Phos 25 - 125 109 127(A) 103  AST 13 - 35 '25 22 13  ' ALT 7 - 35 '14 16 9     ' ASSESSMENT & PLAN:  Assessment/Plan:  A 87 y.o. female with anemia secondary to chronic renal insufifciency.  When evaluating her labs, her hemoglobin  remains well below 10.  Based upon this, I will increase her Retacrit injections to every 2 weeks.  I will see her back in 1 month for repeat clinical assessment.  If her hemoglobin remains low at that time, a bone marrow biopsy may need to be considered.  The patient understands all the plans discussed today and is in agreement  with them.      [name], MD

## 2020-07-15 ENCOUNTER — Inpatient Hospital Stay: Payer: Medicare PPO | Attending: Oncology | Admitting: Oncology

## 2020-07-15 ENCOUNTER — Other Ambulatory Visit: Payer: Self-pay | Admitting: Oncology

## 2020-07-15 ENCOUNTER — Other Ambulatory Visit: Payer: Self-pay

## 2020-07-15 ENCOUNTER — Inpatient Hospital Stay: Payer: Medicare PPO

## 2020-07-15 ENCOUNTER — Telehealth: Payer: Self-pay

## 2020-07-15 ENCOUNTER — Other Ambulatory Visit: Payer: Self-pay | Admitting: Hematology and Oncology

## 2020-07-15 VITALS — BP 132/63 | HR 70 | Temp 97.4°F | Resp 14 | Ht 64.0 in | Wt 151.7 lb

## 2020-07-15 DIAGNOSIS — D631 Anemia in chronic kidney disease: Secondary | ICD-10-CM | POA: Diagnosis not present

## 2020-07-15 DIAGNOSIS — M1990 Unspecified osteoarthritis, unspecified site: Secondary | ICD-10-CM | POA: Diagnosis not present

## 2020-07-15 DIAGNOSIS — D649 Anemia, unspecified: Secondary | ICD-10-CM | POA: Diagnosis not present

## 2020-07-15 DIAGNOSIS — N189 Chronic kidney disease, unspecified: Secondary | ICD-10-CM | POA: Diagnosis not present

## 2020-07-15 DIAGNOSIS — I509 Heart failure, unspecified: Secondary | ICD-10-CM | POA: Diagnosis not present

## 2020-07-15 DIAGNOSIS — E7800 Pure hypercholesterolemia, unspecified: Secondary | ICD-10-CM | POA: Diagnosis not present

## 2020-07-15 DIAGNOSIS — I130 Hypertensive heart and chronic kidney disease with heart failure and stage 1 through stage 4 chronic kidney disease, or unspecified chronic kidney disease: Secondary | ICD-10-CM | POA: Diagnosis not present

## 2020-07-15 DIAGNOSIS — N184 Chronic kidney disease, stage 4 (severe): Secondary | ICD-10-CM

## 2020-07-15 DIAGNOSIS — E1122 Type 2 diabetes mellitus with diabetic chronic kidney disease: Secondary | ICD-10-CM | POA: Diagnosis not present

## 2020-07-15 DIAGNOSIS — M109 Gout, unspecified: Secondary | ICD-10-CM | POA: Diagnosis not present

## 2020-07-15 DIAGNOSIS — N390 Urinary tract infection, site not specified: Secondary | ICD-10-CM | POA: Diagnosis not present

## 2020-07-15 LAB — CBC
MCV: 95 (ref 81–99)
RBC: 2.38 — AB (ref 3.87–5.11)

## 2020-07-15 LAB — CBC AND DIFFERENTIAL
HCT: 23 — AB (ref 36–46)
Hemoglobin: 7.3 — AB (ref 12.0–16.0)
Neutrophils Absolute: 3.36
Platelets: 191 (ref 150–399)
WBC: 4.6

## 2020-07-15 NOTE — Telephone Encounter (Addendum)
Iron results are back. Dr Bobby Rumpf states she is not iron deficient. He would like for her to get Retacrit injection. I sent scheduling message to scheduling. I then called pt's daughter back to make her aware that scheduling would be calling her. Pt's granddaughter, Elmyra Ricks, answered the phone as Jeani Hawking was on another line.    Iron results are not back yet. I told them I would give them a call in the morning.

## 2020-07-16 ENCOUNTER — Telehealth: Payer: Self-pay | Admitting: Oncology

## 2020-07-16 DIAGNOSIS — N184 Chronic kidney disease, stage 4 (severe): Secondary | ICD-10-CM | POA: Diagnosis not present

## 2020-07-16 DIAGNOSIS — R7881 Bacteremia: Secondary | ICD-10-CM | POA: Diagnosis not present

## 2020-07-16 DIAGNOSIS — I5040 Unspecified combined systolic (congestive) and diastolic (congestive) heart failure: Secondary | ICD-10-CM | POA: Diagnosis not present

## 2020-07-16 DIAGNOSIS — N189 Chronic kidney disease, unspecified: Secondary | ICD-10-CM | POA: Diagnosis not present

## 2020-07-16 DIAGNOSIS — D631 Anemia in chronic kidney disease: Secondary | ICD-10-CM | POA: Diagnosis not present

## 2020-07-16 DIAGNOSIS — I110 Hypertensive heart disease with heart failure: Secondary | ICD-10-CM | POA: Diagnosis not present

## 2020-07-16 DIAGNOSIS — I482 Chronic atrial fibrillation, unspecified: Secondary | ICD-10-CM | POA: Diagnosis not present

## 2020-07-16 DIAGNOSIS — I5041 Acute combined systolic (congestive) and diastolic (congestive) heart failure: Secondary | ICD-10-CM | POA: Diagnosis not present

## 2020-07-16 NOTE — Telephone Encounter (Signed)
07/16/20 spoke with daughter and sched appt

## 2020-07-17 ENCOUNTER — Inpatient Hospital Stay: Payer: Medicare PPO | Attending: Oncology

## 2020-07-17 ENCOUNTER — Other Ambulatory Visit: Payer: Self-pay

## 2020-07-17 VITALS — BP 122/48 | HR 69 | Temp 97.5°F | Resp 18 | Ht 64.0 in | Wt 149.0 lb

## 2020-07-17 DIAGNOSIS — N184 Chronic kidney disease, stage 4 (severe): Secondary | ICD-10-CM | POA: Diagnosis not present

## 2020-07-17 DIAGNOSIS — M109 Gout, unspecified: Secondary | ICD-10-CM | POA: Diagnosis not present

## 2020-07-17 DIAGNOSIS — N189 Chronic kidney disease, unspecified: Secondary | ICD-10-CM | POA: Diagnosis present

## 2020-07-17 DIAGNOSIS — N390 Urinary tract infection, site not specified: Secondary | ICD-10-CM | POA: Diagnosis not present

## 2020-07-17 DIAGNOSIS — E1122 Type 2 diabetes mellitus with diabetic chronic kidney disease: Secondary | ICD-10-CM | POA: Diagnosis not present

## 2020-07-17 DIAGNOSIS — E7800 Pure hypercholesterolemia, unspecified: Secondary | ICD-10-CM | POA: Diagnosis not present

## 2020-07-17 DIAGNOSIS — D631 Anemia in chronic kidney disease: Secondary | ICD-10-CM | POA: Insufficient documentation

## 2020-07-17 DIAGNOSIS — I509 Heart failure, unspecified: Secondary | ICD-10-CM | POA: Diagnosis not present

## 2020-07-17 DIAGNOSIS — I130 Hypertensive heart and chronic kidney disease with heart failure and stage 1 through stage 4 chronic kidney disease, or unspecified chronic kidney disease: Secondary | ICD-10-CM | POA: Diagnosis not present

## 2020-07-17 DIAGNOSIS — M1990 Unspecified osteoarthritis, unspecified site: Secondary | ICD-10-CM | POA: Diagnosis not present

## 2020-07-17 MED ORDER — EPOETIN ALFA-EPBX 40000 UNIT/ML IJ SOLN
40000.0000 [IU] | Freq: Once | INTRAMUSCULAR | Status: AC
  Administered 2020-07-17: 40000 [IU] via SUBCUTANEOUS

## 2020-07-17 MED ORDER — EPOETIN ALFA-EPBX 40000 UNIT/ML IJ SOLN
INTRAMUSCULAR | Status: AC
  Filled 2020-07-17: qty 1

## 2020-07-17 NOTE — Patient Instructions (Signed)

## 2020-07-19 DIAGNOSIS — I509 Heart failure, unspecified: Secondary | ICD-10-CM | POA: Diagnosis not present

## 2020-07-19 DIAGNOSIS — E1122 Type 2 diabetes mellitus with diabetic chronic kidney disease: Secondary | ICD-10-CM | POA: Diagnosis not present

## 2020-07-19 DIAGNOSIS — N390 Urinary tract infection, site not specified: Secondary | ICD-10-CM | POA: Diagnosis not present

## 2020-07-19 DIAGNOSIS — I130 Hypertensive heart and chronic kidney disease with heart failure and stage 1 through stage 4 chronic kidney disease, or unspecified chronic kidney disease: Secondary | ICD-10-CM | POA: Diagnosis not present

## 2020-07-19 DIAGNOSIS — M109 Gout, unspecified: Secondary | ICD-10-CM | POA: Diagnosis not present

## 2020-07-19 DIAGNOSIS — M1990 Unspecified osteoarthritis, unspecified site: Secondary | ICD-10-CM | POA: Diagnosis not present

## 2020-07-19 DIAGNOSIS — D631 Anemia in chronic kidney disease: Secondary | ICD-10-CM | POA: Diagnosis not present

## 2020-07-19 DIAGNOSIS — N184 Chronic kidney disease, stage 4 (severe): Secondary | ICD-10-CM | POA: Diagnosis not present

## 2020-07-19 DIAGNOSIS — E7800 Pure hypercholesterolemia, unspecified: Secondary | ICD-10-CM | POA: Diagnosis not present

## 2020-07-23 DIAGNOSIS — M1990 Unspecified osteoarthritis, unspecified site: Secondary | ICD-10-CM | POA: Diagnosis not present

## 2020-07-23 DIAGNOSIS — M109 Gout, unspecified: Secondary | ICD-10-CM | POA: Diagnosis not present

## 2020-07-23 DIAGNOSIS — I509 Heart failure, unspecified: Secondary | ICD-10-CM | POA: Diagnosis not present

## 2020-07-23 DIAGNOSIS — N184 Chronic kidney disease, stage 4 (severe): Secondary | ICD-10-CM | POA: Diagnosis not present

## 2020-07-23 DIAGNOSIS — N390 Urinary tract infection, site not specified: Secondary | ICD-10-CM | POA: Diagnosis not present

## 2020-07-23 DIAGNOSIS — E7800 Pure hypercholesterolemia, unspecified: Secondary | ICD-10-CM | POA: Diagnosis not present

## 2020-07-23 DIAGNOSIS — D631 Anemia in chronic kidney disease: Secondary | ICD-10-CM | POA: Diagnosis not present

## 2020-07-23 DIAGNOSIS — E1122 Type 2 diabetes mellitus with diabetic chronic kidney disease: Secondary | ICD-10-CM | POA: Diagnosis not present

## 2020-07-23 DIAGNOSIS — I130 Hypertensive heart and chronic kidney disease with heart failure and stage 1 through stage 4 chronic kidney disease, or unspecified chronic kidney disease: Secondary | ICD-10-CM | POA: Diagnosis not present

## 2020-07-25 DIAGNOSIS — E1122 Type 2 diabetes mellitus with diabetic chronic kidney disease: Secondary | ICD-10-CM | POA: Diagnosis not present

## 2020-07-25 DIAGNOSIS — E7800 Pure hypercholesterolemia, unspecified: Secondary | ICD-10-CM | POA: Diagnosis not present

## 2020-07-25 DIAGNOSIS — I509 Heart failure, unspecified: Secondary | ICD-10-CM | POA: Diagnosis not present

## 2020-07-25 DIAGNOSIS — M109 Gout, unspecified: Secondary | ICD-10-CM | POA: Diagnosis not present

## 2020-07-25 DIAGNOSIS — D631 Anemia in chronic kidney disease: Secondary | ICD-10-CM | POA: Diagnosis not present

## 2020-07-25 DIAGNOSIS — M1990 Unspecified osteoarthritis, unspecified site: Secondary | ICD-10-CM | POA: Diagnosis not present

## 2020-07-25 DIAGNOSIS — N390 Urinary tract infection, site not specified: Secondary | ICD-10-CM | POA: Diagnosis not present

## 2020-07-25 DIAGNOSIS — N184 Chronic kidney disease, stage 4 (severe): Secondary | ICD-10-CM | POA: Diagnosis not present

## 2020-07-25 DIAGNOSIS — I130 Hypertensive heart and chronic kidney disease with heart failure and stage 1 through stage 4 chronic kidney disease, or unspecified chronic kidney disease: Secondary | ICD-10-CM | POA: Diagnosis not present

## 2020-07-30 ENCOUNTER — Other Ambulatory Visit: Payer: Self-pay | Admitting: Sports Medicine

## 2020-07-30 ENCOUNTER — Telehealth: Payer: Self-pay

## 2020-07-30 DIAGNOSIS — D631 Anemia in chronic kidney disease: Secondary | ICD-10-CM | POA: Diagnosis not present

## 2020-07-30 DIAGNOSIS — E1122 Type 2 diabetes mellitus with diabetic chronic kidney disease: Secondary | ICD-10-CM | POA: Diagnosis not present

## 2020-07-30 DIAGNOSIS — I130 Hypertensive heart and chronic kidney disease with heart failure and stage 1 through stage 4 chronic kidney disease, or unspecified chronic kidney disease: Secondary | ICD-10-CM | POA: Diagnosis not present

## 2020-07-30 DIAGNOSIS — N184 Chronic kidney disease, stage 4 (severe): Secondary | ICD-10-CM | POA: Diagnosis not present

## 2020-07-30 DIAGNOSIS — N390 Urinary tract infection, site not specified: Secondary | ICD-10-CM | POA: Diagnosis not present

## 2020-07-30 DIAGNOSIS — I509 Heart failure, unspecified: Secondary | ICD-10-CM | POA: Diagnosis not present

## 2020-07-30 DIAGNOSIS — E7800 Pure hypercholesterolemia, unspecified: Secondary | ICD-10-CM | POA: Diagnosis not present

## 2020-07-30 DIAGNOSIS — M109 Gout, unspecified: Secondary | ICD-10-CM | POA: Diagnosis not present

## 2020-07-30 DIAGNOSIS — M1990 Unspecified osteoarthritis, unspecified site: Secondary | ICD-10-CM | POA: Diagnosis not present

## 2020-07-30 MED ORDER — SILVER SULFADIAZINE 1 % EX CREA: 1.0000 "application " | TOPICAL_CREAM | Freq: Every day | CUTANEOUS | 0 refills | Status: AC

## 2020-07-30 NOTE — Telephone Encounter (Signed)
Maureen Stout from [facility] called and LVM stating pt's Lt callus looks worse and not looking good.  Per Dr. Cannon Kettle home nurse is to start applying silvadene cream wit dry dressing 2 x's per week and if no better pt is to make an appt to re-evaluate.

## 2020-07-30 NOTE — Progress Notes (Signed)
Rx silvadene for wound care.

## 2020-07-31 ENCOUNTER — Telehealth: Payer: Self-pay | Admitting: Oncology

## 2020-07-31 DIAGNOSIS — N184 Chronic kidney disease, stage 4 (severe): Secondary | ICD-10-CM | POA: Diagnosis not present

## 2020-07-31 DIAGNOSIS — M1990 Unspecified osteoarthritis, unspecified site: Secondary | ICD-10-CM | POA: Diagnosis not present

## 2020-07-31 DIAGNOSIS — E7800 Pure hypercholesterolemia, unspecified: Secondary | ICD-10-CM | POA: Diagnosis not present

## 2020-07-31 DIAGNOSIS — M109 Gout, unspecified: Secondary | ICD-10-CM | POA: Diagnosis not present

## 2020-07-31 DIAGNOSIS — I509 Heart failure, unspecified: Secondary | ICD-10-CM | POA: Diagnosis not present

## 2020-07-31 DIAGNOSIS — I130 Hypertensive heart and chronic kidney disease with heart failure and stage 1 through stage 4 chronic kidney disease, or unspecified chronic kidney disease: Secondary | ICD-10-CM | POA: Diagnosis not present

## 2020-07-31 DIAGNOSIS — E1122 Type 2 diabetes mellitus with diabetic chronic kidney disease: Secondary | ICD-10-CM | POA: Diagnosis not present

## 2020-07-31 DIAGNOSIS — D631 Anemia in chronic kidney disease: Secondary | ICD-10-CM | POA: Diagnosis not present

## 2020-07-31 DIAGNOSIS — N390 Urinary tract infection, site not specified: Secondary | ICD-10-CM | POA: Diagnosis not present

## 2020-07-31 NOTE — Telephone Encounter (Signed)
07/31/20 Spoke with patient and rescheduled appt 

## 2020-08-02 DIAGNOSIS — M109 Gout, unspecified: Secondary | ICD-10-CM | POA: Diagnosis not present

## 2020-08-02 DIAGNOSIS — I130 Hypertensive heart and chronic kidney disease with heart failure and stage 1 through stage 4 chronic kidney disease, or unspecified chronic kidney disease: Secondary | ICD-10-CM | POA: Diagnosis not present

## 2020-08-02 DIAGNOSIS — E7800 Pure hypercholesterolemia, unspecified: Secondary | ICD-10-CM | POA: Diagnosis not present

## 2020-08-02 DIAGNOSIS — D631 Anemia in chronic kidney disease: Secondary | ICD-10-CM | POA: Diagnosis not present

## 2020-08-02 DIAGNOSIS — I509 Heart failure, unspecified: Secondary | ICD-10-CM | POA: Diagnosis not present

## 2020-08-02 DIAGNOSIS — N184 Chronic kidney disease, stage 4 (severe): Secondary | ICD-10-CM | POA: Diagnosis not present

## 2020-08-02 DIAGNOSIS — M1990 Unspecified osteoarthritis, unspecified site: Secondary | ICD-10-CM | POA: Diagnosis not present

## 2020-08-02 DIAGNOSIS — E1122 Type 2 diabetes mellitus with diabetic chronic kidney disease: Secondary | ICD-10-CM | POA: Diagnosis not present

## 2020-08-02 DIAGNOSIS — N390 Urinary tract infection, site not specified: Secondary | ICD-10-CM | POA: Diagnosis not present

## 2020-08-04 NOTE — Progress Notes (Deleted)
Cardiology Office Note:    Date:  08/05/2020   ID:  Maureen Stout, DOB 10-03-1931, MRN [id number]  PCP:  Nicoletta Dress, MD  Cardiologist:  Shirlee More, MD    Referring MD: Nicoletta Dress, MD    ASSESSMENT:    1. Chronic systolic heart failure ([place])   2. Hypertensive heart disease with chronic systolic congestive heart failure ([place])   3. CKD (chronic kidney disease), stage IV (HCC)   4. Paroxysmal atrial fibrillation ([place])   5. On amiodarone therapy   6. Chronic anticoagulation   7. Anemia, unspecified type    PLAN:    In order of problems listed above:  1. ***   Next appointment: ***   Medication Adjustments/Labs and Tests Ordered: Current medicines are reviewed at length with the patient today.  Concerns regarding medicines are outlined above.  No orders of the defined types were placed in this encounter.  No orders of the defined types were placed in this encounter.   No chief complaint on file.   History of Present Illness:    Maureen Stout is a 87 y.o. female with a hx of  atrial fibrillation hypertension dyslipidemia chronic anticoagulation. She was admitted to  [facility] in May 2020 with heart failure and stage IV CKD.  Echocardiogram performed 09/29/2018 showed an ejection fraction of 30 to 35% mildly dilated ventricle and filling pattern with pseudo-normalized consistent with decompensated heart failure. There is mild right ventricular dysfunction mild left and right atrial enlargement she had a large left pleural effusion and a trivial pericardial effusion.  Echocardiogram [facility] 12/23/2018 showed EF improved 50 to 55% and mild elevation of pulmonary artery systolic pressure.  She is mid to [facility] 07/04/2020 with decompensated heart failure.  There is a notation that prior to admission to the hospital she was not taking her diuretic.  Laboratory studies showed significant anemia hemoglobin 8.1 creatinine  1.80 GFR 27 cc potassium 3.9 BNP level severely elevated 40,800 troponins negative for infarction.  Echocardiogram was performed showed mild concentric LVH EF 35 to 95% diastolic dysfunction with elevated left atrial pressure severe left atrial enlargement mild right atrial enlargement moderate mitral and tricuspid regurgitation.  EKG showed sinus rhythm left bundle branch block.   she was not seen by cardiology during that admission She was last seen 02/02/2020. Compliance with diet, lifestyle and medications: *** Past Medical History:  Diagnosis Date  . Abnormal CXR 04/13/2018  . Abnormality of gait 10/19/2018  . Acute blood loss anemia   . Acute combined systolic and diastolic congestive heart failure ([place])   . Acute on chronic anemia 09/07/2018  . Acute on chronic kidney failure ([place])   . Acute pancreatitis without infection or necrosis 09/07/2018  . Anemia of chronic disease   . Anemia of chronic renal failure   . Bacteremia 09/08/2018  . Bladder problem   . Bradycardia 09/07/2018  . Cardiomegaly   . Chronic anticoagulation 01/08/2018  . CKD (chronic kidney disease), stage III ([place])   . Debility 09/19/2018  . Diabetes mellitus ([place])   . Dyslipidemia   . Edema   . Esophageal dysmotility   . Failure to thrive (child)   . First degree AV block 09/06/2018  . Gross hematuria 07/05/2017  . Hypertension   . Hypertensive heart disease 01/08/2018  . Hypoglycemia without diagnosis of diabetes mellitus 09/08/2018  . Hypothyroidism 09/06/2018  . OAB (overactive bladder) 07/05/2017  . On amiodarone therapy 01/08/2018  .  PAH (pulmonary artery hypertension) ([place])   . Paroxysmal A-fib ([place])   . Paroxysmal atrial fibrillation ([place]) 04/18/2017  . Paroxysmal atrial flutter ([place])   . Recurrent UTI   . Sepsis due to Enterobacter species ([place]) 09/08/2018  . Sinus bradycardia   . Urinary retention     Past Surgical History:  Procedure Laterality Date  . PARTIAL HYSTERECTOMY    . TOE SURGERY    .  TONSILLECTOMY      Current Medications: No outpatient medications have been marked as taking for the 08/05/20 encounter (Appointment) with Richardo Priest, MD.     Allergies:   Vancomycin and Uribel [meth-hyo-m bl-na phos-ph sal]   Social History   Socioeconomic History  . Marital status: Widowed    Spouse name: Not on file  . Number of children: Not on file  . Years of education: Not on file  . Highest education level: Not on file  Occupational History  . Not on file  Tobacco Use  . Smoking status: Never Smoker  . Smokeless tobacco: Never Used  Vaping Use  . Vaping Use: Never used  Substance and Sexual Activity  . Alcohol use: No  . Drug use: No  . Sexual activity: Not on file  Other Topics Concern  . Not on file  Social History Narrative  . Not on file   Social Determinants of Health   Financial Resource Strain: Not on file  Food Insecurity: Not on file  Transportation Needs: Not on file  Physical Activity: Not on file  Stress: Not on file  Social Connections: Not on file     Family History: The patient's ***family history includes Breast cancer in her sister; Cancer in her father. There is no history of Diabetes or Heart attack. ROS:   Please see the history of present illness.    All other systems reviewed and are negative.  EKGs/Labs/Other Studies Reviewed:    The following studies were reviewed today:  EKG:  EKG ordered today and personally reviewed.  The ekg ordered today demonstrates ***  Recent Labs: 11/28/2019: TSH 2.260 05/16/2020: ALT 14; BUN 39; Creatinine 2.0; Potassium 4.2; Sodium 136 07/15/2020: Hemoglobin 7.3; Platelets 191  Recent Lipid Panel    Component Value Date/Time   CHOL 151 05/16/2019 1519   TRIG 140 05/16/2019 1519   HDL 53 05/16/2019 1519   CHOLHDL 2.8 05/16/2019 1519   CHOLHDL 2.2 09/08/2018 0529   VLDL 7 09/08/2018 0529   LDLCALC 74 05/16/2019 1519    Physical Exam:    VS:  There were no vitals taken for this visit.     Wt Readings from Last 3 Encounters:  07/17/20 149 lb (67.6 kg)  07/15/20 151 lb 11.2 oz (68.8 kg)  05/20/20 154 lb (69.9 kg)     GEN: *** Well nourished, well developed in no acute distress HEENT: Normal NECK: No JVD; No carotid bruits LYMPHATICS: No lymphadenopathy CARDIAC: ***RRR, no murmurs, rubs, gallops RESPIRATORY:  Clear to auscultation without rales, wheezing or rhonchi  ABDOMEN: Soft, non-tender, non-distended MUSCULOSKELETAL:  No edema; No deformity  SKIN: Warm and dry NEUROLOGIC:  Alert and oriented x 3 PSYCHIATRIC:  Normal affect    Signed, Shirlee More, MD  08/05/2020 7:41 AM    [place]

## 2020-08-05 ENCOUNTER — Ambulatory Visit: Payer: Medicare PPO | Admitting: Cardiology

## 2020-08-06 DIAGNOSIS — I130 Hypertensive heart and chronic kidney disease with heart failure and stage 1 through stage 4 chronic kidney disease, or unspecified chronic kidney disease: Secondary | ICD-10-CM | POA: Diagnosis not present

## 2020-08-06 DIAGNOSIS — M1990 Unspecified osteoarthritis, unspecified site: Secondary | ICD-10-CM | POA: Diagnosis not present

## 2020-08-06 DIAGNOSIS — N184 Chronic kidney disease, stage 4 (severe): Secondary | ICD-10-CM | POA: Diagnosis not present

## 2020-08-06 DIAGNOSIS — E1122 Type 2 diabetes mellitus with diabetic chronic kidney disease: Secondary | ICD-10-CM | POA: Diagnosis not present

## 2020-08-06 DIAGNOSIS — N390 Urinary tract infection, site not specified: Secondary | ICD-10-CM | POA: Diagnosis not present

## 2020-08-06 DIAGNOSIS — I509 Heart failure, unspecified: Secondary | ICD-10-CM | POA: Diagnosis not present

## 2020-08-06 DIAGNOSIS — M109 Gout, unspecified: Secondary | ICD-10-CM | POA: Diagnosis not present

## 2020-08-06 DIAGNOSIS — D631 Anemia in chronic kidney disease: Secondary | ICD-10-CM | POA: Diagnosis not present

## 2020-08-06 DIAGNOSIS — E7800 Pure hypercholesterolemia, unspecified: Secondary | ICD-10-CM | POA: Diagnosis not present

## 2020-08-07 ENCOUNTER — Telehealth: Payer: Self-pay | Admitting: Cardiology

## 2020-08-07 DIAGNOSIS — N184 Chronic kidney disease, stage 4 (severe): Secondary | ICD-10-CM | POA: Diagnosis not present

## 2020-08-07 DIAGNOSIS — M109 Gout, unspecified: Secondary | ICD-10-CM | POA: Diagnosis not present

## 2020-08-07 DIAGNOSIS — I130 Hypertensive heart and chronic kidney disease with heart failure and stage 1 through stage 4 chronic kidney disease, or unspecified chronic kidney disease: Secondary | ICD-10-CM | POA: Diagnosis not present

## 2020-08-07 DIAGNOSIS — D631 Anemia in chronic kidney disease: Secondary | ICD-10-CM | POA: Diagnosis not present

## 2020-08-07 DIAGNOSIS — N390 Urinary tract infection, site not specified: Secondary | ICD-10-CM | POA: Diagnosis not present

## 2020-08-07 DIAGNOSIS — E1122 Type 2 diabetes mellitus with diabetic chronic kidney disease: Secondary | ICD-10-CM | POA: Diagnosis not present

## 2020-08-07 DIAGNOSIS — I509 Heart failure, unspecified: Secondary | ICD-10-CM | POA: Diagnosis not present

## 2020-08-07 DIAGNOSIS — M1990 Unspecified osteoarthritis, unspecified site: Secondary | ICD-10-CM | POA: Diagnosis not present

## 2020-08-07 DIAGNOSIS — E7800 Pure hypercholesterolemia, unspecified: Secondary | ICD-10-CM | POA: Diagnosis not present

## 2020-08-07 NOTE — Telephone Encounter (Signed)
I have not seen her in 6 months I would follow the instructions from her primary care physician.

## 2020-08-07 NOTE — Telephone Encounter (Signed)
Spoke to [place] just now and let her know Dr. Arloa Koh recommendations. She verbalizes understanding and thanked me for calling her back.

## 2020-08-07 NOTE — Telephone Encounter (Signed)
Pt c/o medication issue:  1. Name of Medication: torsemide (DEMADEX) 20 MG tablet  2. How are you currently taking this medication (dosage and times per day)? Not currently taking  3. Are you having a reaction (difficulty breathing--STAT)? no  4. What is your medication issue? [name] from [facility] calling to verify what the patient needs to be taking. She states the patient is not taking torsemide. She states her PCP put her on furosemide 40 mg 1 tablet daily, but the patient is only taking it as needed. She would like to know which medication she is supposed to be on and at what dose. Phone: [phone]

## 2020-08-09 DIAGNOSIS — M109 Gout, unspecified: Secondary | ICD-10-CM | POA: Diagnosis not present

## 2020-08-09 DIAGNOSIS — I509 Heart failure, unspecified: Secondary | ICD-10-CM | POA: Diagnosis not present

## 2020-08-09 DIAGNOSIS — I130 Hypertensive heart and chronic kidney disease with heart failure and stage 1 through stage 4 chronic kidney disease, or unspecified chronic kidney disease: Secondary | ICD-10-CM | POA: Diagnosis not present

## 2020-08-09 DIAGNOSIS — E7800 Pure hypercholesterolemia, unspecified: Secondary | ICD-10-CM | POA: Diagnosis not present

## 2020-08-09 DIAGNOSIS — E1122 Type 2 diabetes mellitus with diabetic chronic kidney disease: Secondary | ICD-10-CM | POA: Diagnosis not present

## 2020-08-09 DIAGNOSIS — M1990 Unspecified osteoarthritis, unspecified site: Secondary | ICD-10-CM | POA: Diagnosis not present

## 2020-08-09 DIAGNOSIS — N390 Urinary tract infection, site not specified: Secondary | ICD-10-CM | POA: Diagnosis not present

## 2020-08-09 DIAGNOSIS — N184 Chronic kidney disease, stage 4 (severe): Secondary | ICD-10-CM | POA: Diagnosis not present

## 2020-08-09 DIAGNOSIS — D631 Anemia in chronic kidney disease: Secondary | ICD-10-CM | POA: Diagnosis not present

## 2020-08-12 ENCOUNTER — Other Ambulatory Visit: Payer: Medicare PPO

## 2020-08-12 ENCOUNTER — Ambulatory Visit: Payer: Medicare PPO | Admitting: Oncology

## 2020-08-12 NOTE — Progress Notes (Signed)
[facility]  [address] [place],  [place]  [address] [phone]  Clinic Day:  08/13/2020  Referring physician: Nicoletta Dress, MD   HISTORY OF PRESENT ILLNESS:  The patient is an 87 y.o. female with anemia secondary to chronic renal insufficiency.  She has been receiving red cell shot therapy intermittently to get her hemoglobin closer to or above 10.  She comes in today to reassess her anemia.  Since her last visit, the patient has been doing okay.  Although she does have some fatigue, she denies having any overt forms of blood loss.    PHYSICAL EXAM:  Blood pressure (!) 157/75, pulse 72, temperature 97.8 F (36.6 C), resp. rate 14, height '5\' 4"'  (1.626 m), weight 150 lb 11.2 oz (68.4 kg), SpO2 100 %. Wt Readings from Last 3 Encounters:  08/13/20 150 lb 11.2 oz (68.4 kg)  07/17/20 149 lb (67.6 kg)  07/15/20 151 lb 11.2 oz (68.8 kg)   Body mass index is 25.87 kg/m. Performance status (ECOG): 2 Physical Exam Constitutional:      Appearance: Normal appearance. She is not ill-appearing.  HENT:     Mouth/Throat:     Mouth: Mucous membranes are moist.     Pharynx: Oropharynx is clear. No oropharyngeal exudate or posterior oropharyngeal erythema.  Cardiovascular:     Rate and Rhythm: Normal rate and regular rhythm.     Heart sounds: No murmur heard. No friction rub. No gallop.   Pulmonary:     Effort: Pulmonary effort is normal. No respiratory distress.     Breath sounds: Normal breath sounds. No wheezing, rhonchi or rales.  Chest:  Breasts:     Right: No axillary adenopathy or supraclavicular adenopathy.     Left: No axillary adenopathy or supraclavicular adenopathy.    Abdominal:     General: Bowel sounds are normal. There is no distension.     Palpations: Abdomen is soft. There is no mass.     Tenderness: There is no abdominal tenderness.  Musculoskeletal:        General: No swelling.     Right lower leg: No edema.     Left  lower leg: No edema.  Lymphadenopathy:     Cervical: No cervical adenopathy.     Upper Body:     Right upper body: No supraclavicular or axillary adenopathy.     Left upper body: No supraclavicular or axillary adenopathy.     Lower Body: No right inguinal adenopathy. No left inguinal adenopathy.  Skin:    General: Skin is warm.     Coloration: Skin is not jaundiced.     Findings: No lesion or rash.  Neurological:     General: No focal deficit present.     Mental Status: She is alert and oriented to person, place, and time. Mental status is at baseline.     Cranial Nerves: Cranial nerves are intact.  Psychiatric:        Mood and Affect: Mood normal.        Behavior: Behavior normal.        Thought Content: Thought content normal.     LABS:   CBC Latest Ref Rng & Units 08/13/2020 07/15/2020 06/17/2020  WBC - 5.3 4.6 4.2  Hemoglobin 12.0 - 16.0 7.9(A) 7.3(A) 8.8(A)  Hematocrit 36 - 46 26(A) 23(A) 27(A)  Platelets 150 - 399 228 191 200   CMP Latest Ref Rng & Units 08/13/2020 05/16/2020 04/16/2020  Glucose 65 - 99  mg/dL - - -  BUN 4 - 21 42(A) 39(A) 39(A)  Creatinine 0.5 - 1.1 2.0(A) 2.0(A) 2.0(A)  Sodium 137 - 147 136(A) 136(A) 136(A)  Potassium 3.4 - 5.3 4.2 4.2 3.8  Chloride 99 - 108 104 103 102  CO2 13 - 22 26(A) 27(A) 24(A)  Calcium 8.7 - 10.7 8.3(A) 8.7 8.8  Total Protein 6.0 - 8.5 g/dL - - -  Total Bilirubin 0.0 - 1.2 mg/dL - - -  Alkaline Phos 25 - 125 120 109 127(A)  AST 13 - 35 '25 25 22  ' ALT 7 - 35 '14 14 16     ' ASSESSMENT & PLAN:  Assessment/Plan:  An 87 y.o. female with anemia secondary to chronic renal insufifciency.  When evaluating her labs, her hemoglobin  remains well below 10.  Based upon this, I told her that I believe a bone marrow biopsy is necessary to ensure underlying bone marrow disease is not present.  The patient is not interested in having this procedure done.  I will acquiesce to her wishes and defer this bone marrow until later.  For now, she will  receive Retacrit injections every 3 weeks.  I will see her back in 12 weeks for repeat clinical assessment.  If her hemoglobin remains low at that time, a bone marrow biopsy will need to be re-discussed.  The patient understands all the plans discussed today and is in agreement with them.      Skarlet Lyons Macarthur Critchley, MD

## 2020-08-13 ENCOUNTER — Other Ambulatory Visit: Payer: Self-pay | Admitting: Oncology

## 2020-08-13 ENCOUNTER — Inpatient Hospital Stay (INDEPENDENT_AMBULATORY_CARE_PROVIDER_SITE_OTHER): Payer: Medicare PPO | Admitting: Oncology

## 2020-08-13 ENCOUNTER — Other Ambulatory Visit: Payer: Self-pay | Admitting: Hematology and Oncology

## 2020-08-13 ENCOUNTER — Inpatient Hospital Stay: Payer: Medicare PPO

## 2020-08-13 ENCOUNTER — Other Ambulatory Visit: Payer: Self-pay

## 2020-08-13 VITALS — BP 157/75 | HR 72 | Temp 97.8°F | Resp 14 | Ht 64.0 in | Wt 150.7 lb

## 2020-08-13 DIAGNOSIS — E1122 Type 2 diabetes mellitus with diabetic chronic kidney disease: Secondary | ICD-10-CM | POA: Diagnosis not present

## 2020-08-13 DIAGNOSIS — N184 Chronic kidney disease, stage 4 (severe): Secondary | ICD-10-CM | POA: Diagnosis not present

## 2020-08-13 DIAGNOSIS — D631 Anemia in chronic kidney disease: Secondary | ICD-10-CM

## 2020-08-13 DIAGNOSIS — I509 Heart failure, unspecified: Secondary | ICD-10-CM | POA: Diagnosis not present

## 2020-08-13 DIAGNOSIS — N189 Chronic kidney disease, unspecified: Secondary | ICD-10-CM | POA: Diagnosis not present

## 2020-08-13 DIAGNOSIS — M109 Gout, unspecified: Secondary | ICD-10-CM | POA: Diagnosis not present

## 2020-08-13 DIAGNOSIS — I130 Hypertensive heart and chronic kidney disease with heart failure and stage 1 through stage 4 chronic kidney disease, or unspecified chronic kidney disease: Secondary | ICD-10-CM | POA: Diagnosis not present

## 2020-08-13 DIAGNOSIS — E7800 Pure hypercholesterolemia, unspecified: Secondary | ICD-10-CM | POA: Diagnosis not present

## 2020-08-13 DIAGNOSIS — D649 Anemia, unspecified: Secondary | ICD-10-CM | POA: Diagnosis not present

## 2020-08-13 DIAGNOSIS — N390 Urinary tract infection, site not specified: Secondary | ICD-10-CM | POA: Diagnosis not present

## 2020-08-13 DIAGNOSIS — M1990 Unspecified osteoarthritis, unspecified site: Secondary | ICD-10-CM | POA: Diagnosis not present

## 2020-08-13 LAB — BASIC METABOLIC PANEL
BUN: 42 — AB (ref 4–21)
CO2: 26 — AB (ref 13–22)
Chloride: 104 (ref 99–108)
Creatinine: 2 — AB (ref 0.5–1.1)
Glucose: 129
Potassium: 4.2 (ref 3.4–5.3)
Sodium: 136 — AB (ref 137–147)

## 2020-08-13 LAB — CBC AND DIFFERENTIAL
HCT: 26 — AB (ref 36–46)
Hemoglobin: 7.9 — AB (ref 12.0–16.0)
Neutrophils Absolute: 3.39
Platelets: 228 (ref 150–399)
WBC: 5.3

## 2020-08-13 LAB — HEPATIC FUNCTION PANEL
ALT: 14 (ref 7–35)
AST: 25 (ref 13–35)
Alkaline Phosphatase: 120 (ref 25–125)
Bilirubin, Total: 0.3

## 2020-08-13 LAB — CBC: RBC: 2.58 — AB (ref 3.87–5.11)

## 2020-08-13 LAB — COMPREHENSIVE METABOLIC PANEL
Albumin: 3.4 — AB (ref 3.5–5.0)
Calcium: 8.3 — AB (ref 8.7–10.7)

## 2020-08-15 ENCOUNTER — Inpatient Hospital Stay: Payer: Medicare PPO

## 2020-08-19 DIAGNOSIS — M1990 Unspecified osteoarthritis, unspecified site: Secondary | ICD-10-CM | POA: Diagnosis not present

## 2020-08-19 DIAGNOSIS — I509 Heart failure, unspecified: Secondary | ICD-10-CM | POA: Diagnosis not present

## 2020-08-19 DIAGNOSIS — E1122 Type 2 diabetes mellitus with diabetic chronic kidney disease: Secondary | ICD-10-CM | POA: Diagnosis not present

## 2020-08-19 DIAGNOSIS — N390 Urinary tract infection, site not specified: Secondary | ICD-10-CM | POA: Diagnosis not present

## 2020-08-19 DIAGNOSIS — I130 Hypertensive heart and chronic kidney disease with heart failure and stage 1 through stage 4 chronic kidney disease, or unspecified chronic kidney disease: Secondary | ICD-10-CM | POA: Diagnosis not present

## 2020-08-19 DIAGNOSIS — M109 Gout, unspecified: Secondary | ICD-10-CM | POA: Diagnosis not present

## 2020-08-19 DIAGNOSIS — D631 Anemia in chronic kidney disease: Secondary | ICD-10-CM | POA: Diagnosis not present

## 2020-08-19 DIAGNOSIS — N184 Chronic kidney disease, stage 4 (severe): Secondary | ICD-10-CM | POA: Diagnosis not present

## 2020-08-19 DIAGNOSIS — E7800 Pure hypercholesterolemia, unspecified: Secondary | ICD-10-CM | POA: Diagnosis not present

## 2020-08-20 DIAGNOSIS — D631 Anemia in chronic kidney disease: Secondary | ICD-10-CM | POA: Diagnosis not present

## 2020-08-20 DIAGNOSIS — E1122 Type 2 diabetes mellitus with diabetic chronic kidney disease: Secondary | ICD-10-CM | POA: Diagnosis not present

## 2020-08-20 DIAGNOSIS — N184 Chronic kidney disease, stage 4 (severe): Secondary | ICD-10-CM | POA: Diagnosis not present

## 2020-08-20 DIAGNOSIS — N390 Urinary tract infection, site not specified: Secondary | ICD-10-CM | POA: Diagnosis not present

## 2020-08-20 DIAGNOSIS — I509 Heart failure, unspecified: Secondary | ICD-10-CM | POA: Diagnosis not present

## 2020-08-20 DIAGNOSIS — M109 Gout, unspecified: Secondary | ICD-10-CM | POA: Diagnosis not present

## 2020-08-20 DIAGNOSIS — M1990 Unspecified osteoarthritis, unspecified site: Secondary | ICD-10-CM | POA: Diagnosis not present

## 2020-08-20 DIAGNOSIS — E7800 Pure hypercholesterolemia, unspecified: Secondary | ICD-10-CM | POA: Diagnosis not present

## 2020-08-20 DIAGNOSIS — I130 Hypertensive heart and chronic kidney disease with heart failure and stage 1 through stage 4 chronic kidney disease, or unspecified chronic kidney disease: Secondary | ICD-10-CM | POA: Diagnosis not present

## 2020-08-26 DIAGNOSIS — N184 Chronic kidney disease, stage 4 (severe): Secondary | ICD-10-CM | POA: Diagnosis not present

## 2020-08-26 DIAGNOSIS — E1122 Type 2 diabetes mellitus with diabetic chronic kidney disease: Secondary | ICD-10-CM | POA: Diagnosis not present

## 2020-08-26 DIAGNOSIS — M1990 Unspecified osteoarthritis, unspecified site: Secondary | ICD-10-CM | POA: Diagnosis not present

## 2020-08-26 DIAGNOSIS — M109 Gout, unspecified: Secondary | ICD-10-CM | POA: Diagnosis not present

## 2020-08-26 DIAGNOSIS — I509 Heart failure, unspecified: Secondary | ICD-10-CM | POA: Diagnosis not present

## 2020-08-26 DIAGNOSIS — E7800 Pure hypercholesterolemia, unspecified: Secondary | ICD-10-CM | POA: Diagnosis not present

## 2020-08-26 DIAGNOSIS — N390 Urinary tract infection, site not specified: Secondary | ICD-10-CM | POA: Diagnosis not present

## 2020-08-26 DIAGNOSIS — D631 Anemia in chronic kidney disease: Secondary | ICD-10-CM | POA: Diagnosis not present

## 2020-08-26 DIAGNOSIS — I130 Hypertensive heart and chronic kidney disease with heart failure and stage 1 through stage 4 chronic kidney disease, or unspecified chronic kidney disease: Secondary | ICD-10-CM | POA: Diagnosis not present

## 2020-08-27 DIAGNOSIS — E1122 Type 2 diabetes mellitus with diabetic chronic kidney disease: Secondary | ICD-10-CM | POA: Diagnosis not present

## 2020-08-27 DIAGNOSIS — D631 Anemia in chronic kidney disease: Secondary | ICD-10-CM | POA: Diagnosis not present

## 2020-08-27 DIAGNOSIS — M1990 Unspecified osteoarthritis, unspecified site: Secondary | ICD-10-CM | POA: Diagnosis not present

## 2020-08-27 DIAGNOSIS — M109 Gout, unspecified: Secondary | ICD-10-CM | POA: Diagnosis not present

## 2020-08-27 DIAGNOSIS — I130 Hypertensive heart and chronic kidney disease with heart failure and stage 1 through stage 4 chronic kidney disease, or unspecified chronic kidney disease: Secondary | ICD-10-CM | POA: Diagnosis not present

## 2020-08-27 DIAGNOSIS — N390 Urinary tract infection, site not specified: Secondary | ICD-10-CM | POA: Diagnosis not present

## 2020-08-27 DIAGNOSIS — E7800 Pure hypercholesterolemia, unspecified: Secondary | ICD-10-CM | POA: Diagnosis not present

## 2020-08-27 DIAGNOSIS — N184 Chronic kidney disease, stage 4 (severe): Secondary | ICD-10-CM | POA: Diagnosis not present

## 2020-08-27 DIAGNOSIS — I509 Heart failure, unspecified: Secondary | ICD-10-CM | POA: Diagnosis not present

## 2020-09-02 DIAGNOSIS — N390 Urinary tract infection, site not specified: Secondary | ICD-10-CM | POA: Diagnosis not present

## 2020-09-02 DIAGNOSIS — I130 Hypertensive heart and chronic kidney disease with heart failure and stage 1 through stage 4 chronic kidney disease, or unspecified chronic kidney disease: Secondary | ICD-10-CM | POA: Diagnosis not present

## 2020-09-02 DIAGNOSIS — M109 Gout, unspecified: Secondary | ICD-10-CM | POA: Diagnosis not present

## 2020-09-02 DIAGNOSIS — D631 Anemia in chronic kidney disease: Secondary | ICD-10-CM | POA: Diagnosis not present

## 2020-09-02 DIAGNOSIS — I509 Heart failure, unspecified: Secondary | ICD-10-CM | POA: Diagnosis not present

## 2020-09-02 DIAGNOSIS — E7800 Pure hypercholesterolemia, unspecified: Secondary | ICD-10-CM | POA: Diagnosis not present

## 2020-09-02 DIAGNOSIS — E1122 Type 2 diabetes mellitus with diabetic chronic kidney disease: Secondary | ICD-10-CM | POA: Diagnosis not present

## 2020-09-02 DIAGNOSIS — M1990 Unspecified osteoarthritis, unspecified site: Secondary | ICD-10-CM | POA: Diagnosis not present

## 2020-09-02 DIAGNOSIS — N184 Chronic kidney disease, stage 4 (severe): Secondary | ICD-10-CM | POA: Diagnosis not present

## 2020-09-04 ENCOUNTER — Other Ambulatory Visit: Payer: Self-pay | Admitting: Pharmacist

## 2020-09-05 ENCOUNTER — Inpatient Hospital Stay: Payer: Medicare PPO | Attending: Oncology

## 2020-09-05 ENCOUNTER — Other Ambulatory Visit: Payer: Self-pay

## 2020-09-05 VITALS — BP 140/79 | HR 71 | Temp 98.0°F | Resp 18 | Ht 64.0 in | Wt 149.0 lb

## 2020-09-05 DIAGNOSIS — D631 Anemia in chronic kidney disease: Secondary | ICD-10-CM | POA: Insufficient documentation

## 2020-09-05 DIAGNOSIS — N189 Chronic kidney disease, unspecified: Secondary | ICD-10-CM | POA: Diagnosis not present

## 2020-09-05 MED ORDER — EPOETIN ALFA-EPBX 40000 UNIT/ML IJ SOLN
INTRAMUSCULAR | Status: AC
  Filled 2020-09-05: qty 1

## 2020-09-05 MED ORDER — EPOETIN ALFA-EPBX 40000 UNIT/ML IJ SOLN
40000.0000 [IU] | Freq: Once | INTRAMUSCULAR | Status: AC
  Administered 2020-09-05: 40000 [IU] via SUBCUTANEOUS

## 2020-09-05 NOTE — Patient Instructions (Signed)

## 2020-09-06 DIAGNOSIS — M1990 Unspecified osteoarthritis, unspecified site: Secondary | ICD-10-CM | POA: Diagnosis not present

## 2020-09-06 DIAGNOSIS — N184 Chronic kidney disease, stage 4 (severe): Secondary | ICD-10-CM | POA: Diagnosis not present

## 2020-09-06 DIAGNOSIS — M109 Gout, unspecified: Secondary | ICD-10-CM | POA: Diagnosis not present

## 2020-09-06 DIAGNOSIS — I509 Heart failure, unspecified: Secondary | ICD-10-CM | POA: Diagnosis not present

## 2020-09-06 DIAGNOSIS — D631 Anemia in chronic kidney disease: Secondary | ICD-10-CM | POA: Diagnosis not present

## 2020-09-06 DIAGNOSIS — E7800 Pure hypercholesterolemia, unspecified: Secondary | ICD-10-CM | POA: Diagnosis not present

## 2020-09-06 DIAGNOSIS — E1122 Type 2 diabetes mellitus with diabetic chronic kidney disease: Secondary | ICD-10-CM | POA: Diagnosis not present

## 2020-09-06 DIAGNOSIS — N390 Urinary tract infection, site not specified: Secondary | ICD-10-CM | POA: Diagnosis not present

## 2020-09-06 DIAGNOSIS — I130 Hypertensive heart and chronic kidney disease with heart failure and stage 1 through stage 4 chronic kidney disease, or unspecified chronic kidney disease: Secondary | ICD-10-CM | POA: Diagnosis not present

## 2020-09-25 ENCOUNTER — Ambulatory Visit (INDEPENDENT_AMBULATORY_CARE_PROVIDER_SITE_OTHER): Payer: Medicare PPO | Admitting: Sports Medicine

## 2020-09-25 ENCOUNTER — Other Ambulatory Visit: Payer: Self-pay

## 2020-09-25 ENCOUNTER — Encounter: Payer: Self-pay | Admitting: Sports Medicine

## 2020-09-25 DIAGNOSIS — E1140 Type 2 diabetes mellitus with diabetic neuropathy, unspecified: Secondary | ICD-10-CM | POA: Diagnosis not present

## 2020-09-25 DIAGNOSIS — L84 Corns and callosities: Secondary | ICD-10-CM

## 2020-09-25 DIAGNOSIS — M79675 Pain in left toe(s): Secondary | ICD-10-CM | POA: Diagnosis not present

## 2020-09-25 DIAGNOSIS — M79674 Pain in right toe(s): Secondary | ICD-10-CM | POA: Diagnosis not present

## 2020-09-25 DIAGNOSIS — B351 Tinea unguium: Secondary | ICD-10-CM

## 2020-09-25 DIAGNOSIS — L97521 Non-pressure chronic ulcer of other part of left foot limited to breakdown of skin: Secondary | ICD-10-CM

## 2020-09-25 DIAGNOSIS — L97511 Non-pressure chronic ulcer of other part of right foot limited to breakdown of skin: Secondary | ICD-10-CM

## 2020-09-25 NOTE — Progress Notes (Signed)
Subjective: Maureen Stout is a 87 y.o. female patient with history of diabetes who returns to office today for diabetic nail and callus care.  Denies any bleeding from preulcerative callus lesion bottom of right foot or left foot.  Reports that her blood sugar was 91 this morning does not know her last A1c and last visit to PCP was 3 months ago.   Objective: General: Patient is awake, alert, and oriented x 3 and in no acute distress.  Integument: Skin is warm, dry and supple bilateral. Nails are elongated thickened and dystrophic with subungual debris, consistent with onychomycosis, 1-5 on right 1,2,4,5 on left.  Callus with pinpoint bleeding noted to the left plantar forefoot submet 1, measures 0.5x0.5cm, and submet 1 on right that measures 0.3x0.3cm, all with a granular base, no significant redness, no significant warmth, no malodor, there is also a preulcerative lesion at the left second toe.  No acute signs of infection.    Vasculature:  Dorsalis Pedis pulse 1/4 bilateral. Posterior Tibial pulse  1/4 bilateral. Capillary fill time <3 sec 1-5 bilateral. Scant hair growth to the level of the digits.Temperature gradient within normal limits. Mild varicosities present bilateral. No edema present bilateral.   Neurology: The patient has absent sensation measured with a 5.07/10g Semmes Weinstein Monofilament at all pedal sites bilateral. Vibratory sensation absent bilateral with tuning fork. No Babinski sign present bilateral.   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 Asymptomatic bunion and hammertoe pedal deformities noted bilateral. Muscular strength 5/5 in all lower extremity muscular groups bilateral without pain on range of motion. No tenderness with calf compression bilateral.  Assessment and plan Problem List Items Addressed This Visit   None   Visit Diagnoses    Pain due to onychomycosis of toenails of both feet    -  Primary   Pre-ulcerative calluses       Foot ulcer, left,  limited to breakdown of skin (HCC)       Foot ulcer, limited to breakdown of skin, right (HCC)       Type 2 diabetes, controlled, with neuropathy ([place])          -Examined patient. -Re-Discussed and educated patient on diabetic foot care, especially with regards to the vascular, neurological and musculoskeletal systems.  -Mechanically debrided all nails x9 using sterile nail nipper without incident and smooth with rotary bur  -At no additional charge mechanically debrided plantar ulcer left forefoot submet 1 using a sterile chisel blade without incident and applied Silvadene ointment and Band-Aid and advised patient to refrain from walking barefoot like previous and to use house shoes or tennis shoes -Applied Betadine to the left second toe and advised patient to closely monitor if worsens return to office sooner -Return to office in 10-12 weeks for nail care or sooner if problems or issues arise -Patient advised to call the office if any problems or questions arise in the meantime.  Landis Martins, DPM

## 2020-09-26 ENCOUNTER — Inpatient Hospital Stay: Payer: Medicare PPO

## 2020-09-26 ENCOUNTER — Inpatient Hospital Stay: Payer: Medicare PPO | Attending: Oncology

## 2020-09-26 ENCOUNTER — Other Ambulatory Visit: Payer: Self-pay | Admitting: Pharmacist

## 2020-09-26 ENCOUNTER — Encounter: Payer: Self-pay | Admitting: Hematology and Oncology

## 2020-09-26 VITALS — BP 139/76 | HR 70 | Temp 98.0°F | Resp 18 | Ht 64.0 in | Wt 148.5 lb

## 2020-09-26 DIAGNOSIS — D631 Anemia in chronic kidney disease: Secondary | ICD-10-CM | POA: Diagnosis not present

## 2020-09-26 DIAGNOSIS — N189 Chronic kidney disease, unspecified: Secondary | ICD-10-CM | POA: Diagnosis not present

## 2020-09-26 LAB — CBC: RBC: 3.15 — AB (ref 3.87–5.11)

## 2020-09-26 LAB — CBC AND DIFFERENTIAL
HCT: 32 — AB (ref 36–46)
Hemoglobin: 9.8 — AB (ref 12.0–16.0)
Neutrophils Absolute: 3.6
Platelets: 212 (ref 150–399)
WBC: 5

## 2020-09-26 MED ORDER — EPOETIN ALFA-EPBX 40000 UNIT/ML IJ SOLN
INTRAMUSCULAR | Status: AC
  Filled 2020-09-26: qty 1

## 2020-09-26 MED ORDER — EPOETIN ALFA-EPBX 40000 UNIT/ML IJ SOLN
40000.0000 [IU] | Freq: Once | INTRAMUSCULAR | Status: AC
  Administered 2020-09-26: 40000 [IU] via SUBCUTANEOUS

## 2020-09-26 NOTE — Patient Instructions (Signed)

## 2020-09-27 ENCOUNTER — Ambulatory Visit: Payer: Medicare PPO | Admitting: Sports Medicine

## 2020-09-27 DIAGNOSIS — I5040 Unspecified combined systolic (congestive) and diastolic (congestive) heart failure: Secondary | ICD-10-CM | POA: Diagnosis not present

## 2020-09-27 DIAGNOSIS — E039 Hypothyroidism, unspecified: Secondary | ICD-10-CM | POA: Diagnosis not present

## 2020-09-27 DIAGNOSIS — I482 Chronic atrial fibrillation, unspecified: Secondary | ICD-10-CM | POA: Diagnosis not present

## 2020-09-27 DIAGNOSIS — Z89422 Acquired absence of other left toe(s): Secondary | ICD-10-CM | POA: Diagnosis not present

## 2020-09-27 DIAGNOSIS — N184 Chronic kidney disease, stage 4 (severe): Secondary | ICD-10-CM | POA: Diagnosis not present

## 2020-09-27 DIAGNOSIS — I110 Hypertensive heart disease with heart failure: Secondary | ICD-10-CM | POA: Diagnosis not present

## 2020-09-27 DIAGNOSIS — D638 Anemia in other chronic diseases classified elsewhere: Secondary | ICD-10-CM | POA: Diagnosis not present

## 2020-09-27 DIAGNOSIS — E785 Hyperlipidemia, unspecified: Secondary | ICD-10-CM | POA: Diagnosis not present

## 2020-09-27 DIAGNOSIS — E1142 Type 2 diabetes mellitus with diabetic polyneuropathy: Secondary | ICD-10-CM | POA: Diagnosis not present

## 2020-10-17 ENCOUNTER — Inpatient Hospital Stay: Payer: Medicare PPO

## 2020-10-17 ENCOUNTER — Encounter: Payer: Self-pay | Admitting: Hematology and Oncology

## 2020-10-17 ENCOUNTER — Inpatient Hospital Stay: Payer: Medicare PPO | Attending: Oncology

## 2020-10-17 ENCOUNTER — Other Ambulatory Visit: Payer: Self-pay

## 2020-10-17 DIAGNOSIS — D649 Anemia, unspecified: Secondary | ICD-10-CM | POA: Diagnosis not present

## 2020-10-17 DIAGNOSIS — D631 Anemia in chronic kidney disease: Secondary | ICD-10-CM | POA: Diagnosis not present

## 2020-10-17 DIAGNOSIS — N189 Chronic kidney disease, unspecified: Secondary | ICD-10-CM

## 2020-10-17 DIAGNOSIS — Z79899 Other long term (current) drug therapy: Secondary | ICD-10-CM | POA: Diagnosis not present

## 2020-10-17 DIAGNOSIS — N184 Chronic kidney disease, stage 4 (severe): Secondary | ICD-10-CM

## 2020-10-17 LAB — CBC: RBC: 2.89 — AB (ref 3.87–5.11)

## 2020-10-17 LAB — IRON AND TIBC
Iron: 49 ug/dL (ref 28–170)
Saturation Ratios: 15 % (ref 10.4–31.8)
TIBC: 326 ug/dL (ref 250–450)
UIBC: 277 ug/dL

## 2020-10-17 LAB — BASIC METABOLIC PANEL
BUN: 45 — AB (ref 4–21)
CO2: 27 — AB (ref 13–22)
Chloride: 105 (ref 99–108)
Creatinine: 1.9 — AB (ref 0.5–1.1)
Glucose: 126
Potassium: 4.3 (ref 3.4–5.3)
Sodium: 137 (ref 137–147)

## 2020-10-17 LAB — HEPATIC FUNCTION PANEL
ALT: 15 (ref 7–35)
AST: 28 (ref 13–35)
Alkaline Phosphatase: 122 (ref 25–125)
Bilirubin, Total: 0.3

## 2020-10-17 LAB — FERRITIN: Ferritin: 32 ng/mL (ref 11–307)

## 2020-10-17 LAB — COMPREHENSIVE METABOLIC PANEL
Albumin: 3.4 — AB (ref 3.5–5.0)
Calcium: 8.2 — AB (ref 8.7–10.7)

## 2020-10-17 LAB — CBC AND DIFFERENTIAL
HCT: 28 — AB (ref 36–46)
Hemoglobin: 9 — AB (ref 12.0–16.0)
Neutrophils Absolute: 2.67
Platelets: 230 (ref 150–399)
WBC: 4.6

## 2020-10-17 MED ORDER — EPOETIN ALFA-EPBX 40000 UNIT/ML IJ SOLN
40000.0000 [IU] | Freq: Once | INTRAMUSCULAR | Status: AC
  Administered 2020-10-17: 40000 [IU] via SUBCUTANEOUS

## 2020-10-17 NOTE — Progress Notes (Signed)
1417:PT STABLE AT TIME OF DISCHARGE  

## 2020-10-17 NOTE — Patient Instructions (Signed)

## 2020-10-24 DIAGNOSIS — D509 Iron deficiency anemia, unspecified: Secondary | ICD-10-CM | POA: Diagnosis not present

## 2020-10-24 DIAGNOSIS — R0602 Shortness of breath: Secondary | ICD-10-CM | POA: Diagnosis not present

## 2020-10-24 DIAGNOSIS — E119 Type 2 diabetes mellitus without complications: Secondary | ICD-10-CM | POA: Diagnosis not present

## 2020-10-24 DIAGNOSIS — Z66 Do not resuscitate: Secondary | ICD-10-CM | POA: Diagnosis not present

## 2020-10-24 DIAGNOSIS — Z7901 Long term (current) use of anticoagulants: Secondary | ICD-10-CM | POA: Diagnosis not present

## 2020-10-24 DIAGNOSIS — E1122 Type 2 diabetes mellitus with diabetic chronic kidney disease: Secondary | ICD-10-CM | POA: Diagnosis not present

## 2020-10-24 DIAGNOSIS — E785 Hyperlipidemia, unspecified: Secondary | ICD-10-CM | POA: Diagnosis not present

## 2020-10-24 DIAGNOSIS — J189 Pneumonia, unspecified organism: Secondary | ICD-10-CM | POA: Diagnosis not present

## 2020-10-24 DIAGNOSIS — N183 Chronic kidney disease, stage 3 unspecified: Secondary | ICD-10-CM | POA: Diagnosis not present

## 2020-10-24 DIAGNOSIS — I5022 Chronic systolic (congestive) heart failure: Secondary | ICD-10-CM | POA: Diagnosis not present

## 2020-10-24 DIAGNOSIS — I130 Hypertensive heart and chronic kidney disease with heart failure and stage 1 through stage 4 chronic kidney disease, or unspecified chronic kidney disease: Secondary | ICD-10-CM | POA: Diagnosis not present

## 2020-10-30 DIAGNOSIS — E1122 Type 2 diabetes mellitus with diabetic chronic kidney disease: Secondary | ICD-10-CM | POA: Diagnosis not present

## 2020-10-30 DIAGNOSIS — J189 Pneumonia, unspecified organism: Secondary | ICD-10-CM | POA: Diagnosis not present

## 2020-10-30 DIAGNOSIS — I130 Hypertensive heart and chronic kidney disease with heart failure and stage 1 through stage 4 chronic kidney disease, or unspecified chronic kidney disease: Secondary | ICD-10-CM | POA: Diagnosis not present

## 2020-10-30 DIAGNOSIS — D509 Iron deficiency anemia, unspecified: Secondary | ICD-10-CM | POA: Diagnosis not present

## 2020-10-30 DIAGNOSIS — I509 Heart failure, unspecified: Secondary | ICD-10-CM | POA: Diagnosis not present

## 2020-10-30 DIAGNOSIS — E7800 Pure hypercholesterolemia, unspecified: Secondary | ICD-10-CM | POA: Diagnosis not present

## 2020-10-30 DIAGNOSIS — N184 Chronic kidney disease, stage 4 (severe): Secondary | ICD-10-CM | POA: Diagnosis not present

## 2020-10-30 DIAGNOSIS — D631 Anemia in chronic kidney disease: Secondary | ICD-10-CM | POA: Diagnosis not present

## 2020-10-30 DIAGNOSIS — E039 Hypothyroidism, unspecified: Secondary | ICD-10-CM | POA: Diagnosis not present

## 2020-10-31 DIAGNOSIS — J189 Pneumonia, unspecified organism: Secondary | ICD-10-CM | POA: Diagnosis not present

## 2020-10-31 DIAGNOSIS — N179 Acute kidney failure, unspecified: Secondary | ICD-10-CM | POA: Diagnosis not present

## 2020-10-31 DIAGNOSIS — N189 Chronic kidney disease, unspecified: Secondary | ICD-10-CM | POA: Diagnosis not present

## 2020-10-31 DIAGNOSIS — D649 Anemia, unspecified: Secondary | ICD-10-CM | POA: Diagnosis not present

## 2020-11-01 NOTE — Progress Notes (Signed)
[facility]  [address] [place],  [place]  [address] [phone]  Clinic Day:  11/05/2020  Referring physician: Nicoletta Dress, MD  This document serves as a record of services personally performed by [name], MD. It was created on their behalf by [facility], a trained medical scribe. The creation of this record is based on the scribe's personal observations and the provider's statements to them.  HISTORY OF PRESENT ILLNESS:  The patient is an 87 y.o. female with anemia secondary to chronic renal insufficiency.  She has been receiving red cell shot therapy every 3 weeks as her hemoglobin has been well below 10.  She comes in today to reassess her anemia.  Since her last visit, the patient has been doing okay.  Despite getting red cell shots routinely, her hemoglobin fell to 6.0 a few weeks ago to where she was transfused 1 unit of blood.  Although she does have some fatigue, she continues to deny having any overt forms of blood loss.    PHYSICAL EXAM:  Blood pressure (!) 142/63, pulse 63, temperature 97.6 F (36.4 C), resp. rate 14, height _0  (1.626 m), weight 137 lb 6.4 oz (62.3 kg), SpO2 98 %. Wt Readings from Last 3 Encounters:  11/05/20 137 lb 6.4 oz (62.3 kg)  09/26/20 148 lb 8 oz (67.4 kg)  09/05/20 149 lb (67.6 kg)   Body mass index is 23.58 kg/m. Performance status (ECOG): 2 Physical Exam Constitutional:      Appearance: Normal appearance. She is not ill-appearing.  HENT:     Mouth/Throat:     Mouth: Mucous membranes are moist.     Pharynx: Oropharynx is clear. No oropharyngeal exudate or posterior oropharyngeal erythema.  Cardiovascular:     Rate and Rhythm: Normal rate and regular rhythm.     Heart sounds: No murmur heard.   No friction rub. No gallop.  Pulmonary:     Effort: Pulmonary effort is normal. No respiratory distress.     Breath sounds: Normal breath sounds. No wheezing, rhonchi or rales.  Chest:   Breasts:    Right: No axillary adenopathy or supraclavicular adenopathy.     Left: No axillary adenopathy or supraclavicular adenopathy.  Abdominal:     General: Bowel sounds are normal. There is no distension.     Palpations: Abdomen is soft. There is no mass.     Tenderness: There is no abdominal tenderness.  Musculoskeletal:        General: No swelling.     Right lower leg: No edema.     Left lower leg: No edema.  Lymphadenopathy:     Cervical: No cervical adenopathy.     Upper Body:     Right upper body: No supraclavicular or axillary adenopathy.     Left upper body: No supraclavicular or axillary adenopathy.     Lower Body: No right inguinal adenopathy. No left inguinal adenopathy.  Skin:    General: Skin is warm.     Coloration: Skin is not jaundiced.     Findings: No lesion or rash.  Neurological:     General: No focal deficit present.     Mental Status: She is alert and oriented to person, place, and time. Mental status is at baseline.     Cranial Nerves: Cranial nerves are intact.  Psychiatric:        Mood and Affect: Mood normal.        Behavior: Behavior normal.  Thought Content: Thought content normal.    LABS:   CBC Latest Ref Rng & Units 11/05/2020 10/17/2020 09/26/2020  WBC - 4.6 4.6 5.0  Hemoglobin 12.0 - 16.0 7.5(A) 9.0(A) 9.8(A)  Hematocrit 36 - 46 24(A) 28(A) 32(A)  Platelets 150 - 399 279 230 212   CMP Latest Ref Rng & Units 10/17/2020 08/13/2020 05/16/2020  Glucose 65 - 99 mg/dL - - -  BUN 4 - 21 45(A) 42(A) 39(A)  Creatinine 0.5 - 1.1 1.9(A) 2.0(A) 2.0(A)  Sodium 137 - 147 137 136(A) 136(A)  Potassium 3.4 - 5.3 4.3 4.2 4.2  Chloride 99 - 108 105 104 103  CO2 13 - 22 27(A) 26(A) 27(A)  Calcium 8.7 - 10.7 8.2(A) 8.3(A) 8.7  Total Protein 6.0 - 8.5 g/dL - - -  Total Bilirubin 0.0 - 1.2 mg/dL - - -  Alkaline Phos 25 - 125 122 120 109  AST 13 - 35 _0 ALT 7 - 35 _1 ASSESSMENT & PLAN:  Assessment/Plan:  An 87 y.o. female with  anemia secondary to chronic renal insufifciency.  When evaluating her labs, her hemoglobin  remains well below 10.  This is despite her getting red cell shots routinely.  Her hemoglobin remains low even after receiving 1 unit of blood. As I am concerned her anemia may be due to something more than just her kidney disease, I will arrange for her to undergo a bone marrow biopsy later this week to ensure an infiltrative bone marrow process is not present.  I will see her back 2 weeks later to go over her bone marrow results and their implications.  The patient understands all the plans discussed today and is in agreement with them.     I, Rita Ohara, am acting as scribe for [name], MD    I have reviewed this report as typed by the medical scribe, and it is complete and accurate.  [name], MD

## 2020-11-04 DIAGNOSIS — N184 Chronic kidney disease, stage 4 (severe): Secondary | ICD-10-CM | POA: Diagnosis not present

## 2020-11-04 DIAGNOSIS — E1122 Type 2 diabetes mellitus with diabetic chronic kidney disease: Secondary | ICD-10-CM | POA: Diagnosis not present

## 2020-11-04 DIAGNOSIS — D509 Iron deficiency anemia, unspecified: Secondary | ICD-10-CM | POA: Diagnosis not present

## 2020-11-04 DIAGNOSIS — J189 Pneumonia, unspecified organism: Secondary | ICD-10-CM | POA: Diagnosis not present

## 2020-11-04 DIAGNOSIS — D631 Anemia in chronic kidney disease: Secondary | ICD-10-CM | POA: Diagnosis not present

## 2020-11-04 DIAGNOSIS — E039 Hypothyroidism, unspecified: Secondary | ICD-10-CM | POA: Diagnosis not present

## 2020-11-04 DIAGNOSIS — I509 Heart failure, unspecified: Secondary | ICD-10-CM | POA: Diagnosis not present

## 2020-11-04 DIAGNOSIS — I130 Hypertensive heart and chronic kidney disease with heart failure and stage 1 through stage 4 chronic kidney disease, or unspecified chronic kidney disease: Secondary | ICD-10-CM | POA: Diagnosis not present

## 2020-11-04 DIAGNOSIS — E7800 Pure hypercholesterolemia, unspecified: Secondary | ICD-10-CM | POA: Diagnosis not present

## 2020-11-05 ENCOUNTER — Inpatient Hospital Stay (INDEPENDENT_AMBULATORY_CARE_PROVIDER_SITE_OTHER): Payer: Medicare PPO | Admitting: Oncology

## 2020-11-05 ENCOUNTER — Other Ambulatory Visit: Payer: Self-pay | Admitting: Oncology

## 2020-11-05 ENCOUNTER — Other Ambulatory Visit: Payer: Self-pay

## 2020-11-05 ENCOUNTER — Inpatient Hospital Stay: Payer: Medicare PPO

## 2020-11-05 ENCOUNTER — Encounter: Payer: Self-pay | Admitting: Oncology

## 2020-11-05 VITALS — BP 142/63 | HR 63 | Temp 97.6°F | Resp 14 | Ht 64.0 in | Wt 137.4 lb

## 2020-11-05 DIAGNOSIS — D638 Anemia in other chronic diseases classified elsewhere: Secondary | ICD-10-CM

## 2020-11-05 DIAGNOSIS — D631 Anemia in chronic kidney disease: Secondary | ICD-10-CM

## 2020-11-05 DIAGNOSIS — N184 Chronic kidney disease, stage 4 (severe): Secondary | ICD-10-CM | POA: Diagnosis not present

## 2020-11-05 DIAGNOSIS — N189 Chronic kidney disease, unspecified: Secondary | ICD-10-CM

## 2020-11-05 DIAGNOSIS — Z79899 Other long term (current) drug therapy: Secondary | ICD-10-CM | POA: Diagnosis not present

## 2020-11-05 LAB — CBC: RBC: 2.54 — AB (ref 3.87–5.11)

## 2020-11-05 LAB — CBC AND DIFFERENTIAL
HCT: 24 — AB (ref 36–46)
Hemoglobin: 7.5 — AB (ref 12.0–16.0)
Neutrophils Absolute: 3.27
Platelets: 279 (ref 150–399)
WBC: 4.6

## 2020-11-06 DIAGNOSIS — D631 Anemia in chronic kidney disease: Secondary | ICD-10-CM | POA: Diagnosis not present

## 2020-11-06 DIAGNOSIS — I130 Hypertensive heart and chronic kidney disease with heart failure and stage 1 through stage 4 chronic kidney disease, or unspecified chronic kidney disease: Secondary | ICD-10-CM | POA: Diagnosis not present

## 2020-11-06 DIAGNOSIS — J189 Pneumonia, unspecified organism: Secondary | ICD-10-CM | POA: Diagnosis not present

## 2020-11-06 DIAGNOSIS — N184 Chronic kidney disease, stage 4 (severe): Secondary | ICD-10-CM | POA: Diagnosis not present

## 2020-11-06 DIAGNOSIS — E039 Hypothyroidism, unspecified: Secondary | ICD-10-CM | POA: Diagnosis not present

## 2020-11-06 DIAGNOSIS — D509 Iron deficiency anemia, unspecified: Secondary | ICD-10-CM | POA: Diagnosis not present

## 2020-11-06 DIAGNOSIS — E7800 Pure hypercholesterolemia, unspecified: Secondary | ICD-10-CM | POA: Diagnosis not present

## 2020-11-06 DIAGNOSIS — I509 Heart failure, unspecified: Secondary | ICD-10-CM | POA: Diagnosis not present

## 2020-11-06 DIAGNOSIS — E1122 Type 2 diabetes mellitus with diabetic chronic kidney disease: Secondary | ICD-10-CM | POA: Diagnosis not present

## 2020-11-08 ENCOUNTER — Inpatient Hospital Stay (INDEPENDENT_AMBULATORY_CARE_PROVIDER_SITE_OTHER): Payer: Medicare PPO | Admitting: Oncology

## 2020-11-08 ENCOUNTER — Inpatient Hospital Stay: Payer: Medicare PPO

## 2020-11-08 ENCOUNTER — Encounter: Payer: Self-pay | Admitting: Oncology

## 2020-11-08 ENCOUNTER — Other Ambulatory Visit: Payer: Self-pay | Admitting: Nurse Practitioner

## 2020-11-08 ENCOUNTER — Other Ambulatory Visit: Payer: Self-pay

## 2020-11-08 ENCOUNTER — Other Ambulatory Visit: Payer: Self-pay | Admitting: Oncology

## 2020-11-08 VITALS — BP 149/68 | HR 69 | Temp 97.2°F | Resp 16 | Ht 64.0 in | Wt 137.7 lb

## 2020-11-08 DIAGNOSIS — D649 Anemia, unspecified: Secondary | ICD-10-CM

## 2020-11-08 DIAGNOSIS — D631 Anemia in chronic kidney disease: Secondary | ICD-10-CM

## 2020-11-08 DIAGNOSIS — D6489 Other specified anemias: Secondary | ICD-10-CM | POA: Diagnosis not present

## 2020-11-08 DIAGNOSIS — Z79899 Other long term (current) drug therapy: Secondary | ICD-10-CM | POA: Diagnosis not present

## 2020-11-08 DIAGNOSIS — D72822 Plasmacytosis: Secondary | ICD-10-CM | POA: Diagnosis not present

## 2020-11-08 DIAGNOSIS — N189 Chronic kidney disease, unspecified: Secondary | ICD-10-CM | POA: Diagnosis not present

## 2020-11-08 DIAGNOSIS — D638 Anemia in other chronic diseases classified elsewhere: Secondary | ICD-10-CM

## 2020-11-08 DIAGNOSIS — N184 Chronic kidney disease, stage 4 (severe): Secondary | ICD-10-CM | POA: Diagnosis not present

## 2020-11-08 LAB — CBC AND DIFFERENTIAL
HCT: 21 — AB (ref 36–46)
Hemoglobin: 6.5 — AB (ref 12.0–16.0)
Neutrophils Absolute: 3.38
Platelets: 227 (ref 150–399)
WBC: 4.9

## 2020-11-08 LAB — PREPARE RBC (CROSSMATCH)

## 2020-11-08 LAB — CBC: RBC: 2.23 — AB (ref 3.87–5.11)

## 2020-11-08 MED ORDER — ACETAMINOPHEN 325 MG PO TABS
650.0000 mg | ORAL_TABLET | Freq: Once | ORAL | Status: AC
  Administered 2020-11-08: 650 mg via ORAL

## 2020-11-08 MED ORDER — SODIUM CHLORIDE 0.9% FLUSH
3.0000 mL | INTRAVENOUS | Status: DC | PRN
  Filled 2020-11-08: qty 10

## 2020-11-08 MED ORDER — DIPHENHYDRAMINE HCL 25 MG PO CAPS
25.0000 mg | ORAL_CAPSULE | Freq: Once | ORAL | Status: AC
Start: 2020-11-08 — End: 2020-11-08
  Administered 2020-11-08: 25 mg via ORAL

## 2020-11-08 MED ORDER — HEPARIN SOD (PORK) LOCK FLUSH 100 UNIT/ML IV SOLN
250.0000 [IU] | INTRAVENOUS | Status: DC | PRN
  Filled 2020-11-08: qty 5

## 2020-11-08 MED ORDER — SODIUM CHLORIDE 0.9% FLUSH
10.0000 mL | INTRAVENOUS | Status: DC | PRN
  Filled 2020-11-08: qty 10

## 2020-11-08 MED ORDER — DIPHENHYDRAMINE HCL 25 MG PO CAPS
ORAL_CAPSULE | ORAL | Status: AC
  Filled 2020-11-08: qty 1

## 2020-11-08 MED ORDER — ACETAMINOPHEN 325 MG PO TABS
ORAL_TABLET | ORAL | Status: AC
  Filled 2020-11-08: qty 2

## 2020-11-08 MED ORDER — HEPARIN SOD (PORK) LOCK FLUSH 100 UNIT/ML IV SOLN
500.0000 [IU] | Freq: Every day | INTRAVENOUS | Status: DC | PRN
Start: 2020-11-08 — End: 2020-11-08
  Filled 2020-11-08: qty 5

## 2020-11-08 MED ORDER — SODIUM CHLORIDE 0.9% IV SOLUTION
250.0000 mL | Freq: Once | INTRAVENOUS | Status: DC
  Filled 2020-11-08: qty 250

## 2020-11-08 NOTE — Patient Instructions (Signed)
Blood Transfusion, Adult, Care After This sheet gives you information about how to care for yourself after your procedure. Your doctor may also give you more specific instructions. If youhave problems or questions, contact your doctor. What can I expect after the procedure? After the procedure, it is common to have: Bruising and soreness at the IV site. A fever or chills on the day of the procedure. This may be your body's response to the new blood cells received. A headache. Follow these instructions at home: Insertion site care     Follow instructions from your doctor about how to take care of your insertion site. This is where an IV tube was put into your vein. Make sure you: Wash your hands with soap and water before and after you change your bandage (dressing). If you cannot use soap and water, use hand sanitizer. Change your bandage as told by your doctor. Check your insertion site every day for signs of infection. Check for: Redness, swelling, or pain. Bleeding from the site. Warmth. Pus or a bad smell. General instructions Take over-the-counter and prescription medicines only as told by your doctor. Rest as told by your doctor. Go back to your normal activities as told by your doctor. Keep all follow-up visits as told by your doctor. This is important. Contact a doctor if: You have itching or red, swollen areas of skin (hives). You feel worried or nervous (anxious). You feel weak after doing your normal activities. You have redness, swelling, warmth, or pain around the insertion site. You have blood coming from the insertion site, and the blood does not stop with pressure. You have pus or a bad smell coming from the insertion site. Get help right away if: You have signs of a serious reaction. This may be coming from an allergy or the body's defense system (immune system). Signs include: Trouble breathing or shortness of breath. Swelling of the face or feeling warm  (flushed). Fever or chills. Head, chest, or back pain. Dark pee (urine) or blood in the pee. Widespread rash. Fast heartbeat. Feeling dizzy or light-headed. You may receive your blood transfusion in an outpatient setting. If so, youwill be told whom to contact to report any reactions. These symptoms may be an emergency. Do not wait to see if the symptoms will go away. Get medical help right away. Call your local emergency services (911 in the [place] Do not drive yourself to the hospital. Summary Bruising and soreness at the IV site are common. Check your insertion site every day for signs of infection. Rest as told by your doctor. Go back to your normal activities as told by your doctor. Get help right away if you have signs of a serious reaction. This information is not intended to replace advice given to you by your health care provider. Make sure you discuss any questions you have with your healthcare provider. Document Revised: 10/27/2018 Document Reviewed: 10/27/2018 Elsevier Patient Education  2022 [employer]  

## 2020-11-08 NOTE — Progress Notes (Signed)
BONE MARROW PROCEDURE NOTE Due to the patient's refractory anemia, a bone marrow biopsy was done today for further evaluation.  After consenting for the procedure, the patient's left posterior superior iliac crest was topically sterilized.  Afterwards, a bone marrow core and aspirate were collected, which will be sent for flow cytometry and cytogenetics.  A myelodysplasia FISH panel will also be done if myelodysplasia is seen.  There were no complications experienced with this procedure.   Of note, her hemoglobin was low at 6.5 - we will arrange for her to be transfused 2 units of blood over these next few days.

## 2020-11-08 NOTE — Progress Notes (Signed)
1646: PT STABLE AT TIME OF DISCHARGE

## 2020-11-11 ENCOUNTER — Other Ambulatory Visit: Payer: Self-pay

## 2020-11-11 ENCOUNTER — Inpatient Hospital Stay: Payer: Medicare PPO

## 2020-11-11 DIAGNOSIS — Z79899 Other long term (current) drug therapy: Secondary | ICD-10-CM | POA: Diagnosis not present

## 2020-11-11 DIAGNOSIS — N189 Chronic kidney disease, unspecified: Secondary | ICD-10-CM | POA: Diagnosis not present

## 2020-11-11 DIAGNOSIS — D649 Anemia, unspecified: Secondary | ICD-10-CM

## 2020-11-11 DIAGNOSIS — D631 Anemia in chronic kidney disease: Secondary | ICD-10-CM | POA: Diagnosis not present

## 2020-11-11 MED ORDER — DIPHENHYDRAMINE HCL 25 MG PO CAPS
25.0000 mg | ORAL_CAPSULE | Freq: Once | ORAL | Status: AC
  Administered 2020-11-11: 25 mg via ORAL

## 2020-11-11 MED ORDER — HEPARIN SOD (PORK) LOCK FLUSH 100 UNIT/ML IV SOLN
500.0000 [IU] | Freq: Every day | INTRAVENOUS | Status: DC | PRN
Start: 2020-11-11 — End: 2020-11-11
  Filled 2020-11-11: qty 5

## 2020-11-11 MED ORDER — DIPHENHYDRAMINE HCL 25 MG PO CAPS
ORAL_CAPSULE | ORAL | Status: AC
  Filled 2020-11-11: qty 1

## 2020-11-11 MED ORDER — SODIUM CHLORIDE 0.9% IV SOLUTION
250.0000 mL | Freq: Once | INTRAVENOUS | Status: DC
  Filled 2020-11-11: qty 250

## 2020-11-11 MED ORDER — ACETAMINOPHEN 325 MG PO TABS
ORAL_TABLET | ORAL | Status: AC
  Filled 2020-11-11: qty 2

## 2020-11-11 MED ORDER — ACETAMINOPHEN 325 MG PO TABS
650.0000 mg | ORAL_TABLET | Freq: Once | ORAL | Status: AC
  Administered 2020-11-11: 650 mg via ORAL

## 2020-11-11 NOTE — Patient Instructions (Signed)
Blood Transfusion, Adult, Care After This sheet gives you information about how to care for yourself after your procedure. Your doctor may also give you more specific instructions. If youhave problems or questions, contact your doctor. What can I expect after the procedure? After the procedure, it is common to have: Bruising and soreness at the IV site. A fever or chills on the day of the procedure. This may be your body's response to the new blood cells received. A headache. Follow these instructions at home: Insertion site care     Follow instructions from your doctor about how to take care of your insertion site. This is where an IV tube was put into your vein. Make sure you: Wash your hands with soap and water before and after you change your bandage (dressing). If you cannot use soap and water, use hand sanitizer. Change your bandage as told by your doctor. Check your insertion site every day for signs of infection. Check for: Redness, swelling, or pain. Bleeding from the site. Warmth. Pus or a bad smell. General instructions Take over-the-counter and prescription medicines only as told by your doctor. Rest as told by your doctor. Go back to your normal activities as told by your doctor. Keep all follow-up visits as told by your doctor. This is important. Contact a doctor if: You have itching or red, swollen areas of skin (hives). You feel worried or nervous (anxious). You feel weak after doing your normal activities. You have redness, swelling, warmth, or pain around the insertion site. You have blood coming from the insertion site, and the blood does not stop with pressure. You have pus or a bad smell coming from the insertion site. Get help right away if: You have signs of a serious reaction. This may be coming from an allergy or the body's defense system (immune system). Signs include: Trouble breathing or shortness of breath. Swelling of the face or feeling warm  (flushed). Fever or chills. Head, chest, or back pain. Dark pee (urine) or blood in the pee. Widespread rash. Fast heartbeat. Feeling dizzy or light-headed. You may receive your blood transfusion in an outpatient setting. If so, youwill be told whom to contact to report any reactions. These symptoms may be an emergency. Do not wait to see if the symptoms will go away. Get medical help right away. Call your local emergency services (911 in the [place] Do not drive yourself to the hospital. Summary Bruising and soreness at the IV site are common. Check your insertion site every day for signs of infection. Rest as told by your doctor. Go back to your normal activities as told by your doctor. Get help right away if you have signs of a serious reaction. This information is not intended to replace advice given to you by your health care provider. Make sure you discuss any questions you have with your healthcare provider. Document Revised: 10/27/2018 Document Reviewed: 10/27/2018 Elsevier Patient Education  2022 [employer]  

## 2020-11-12 DIAGNOSIS — J189 Pneumonia, unspecified organism: Secondary | ICD-10-CM | POA: Diagnosis not present

## 2020-11-12 DIAGNOSIS — E1122 Type 2 diabetes mellitus with diabetic chronic kidney disease: Secondary | ICD-10-CM | POA: Diagnosis not present

## 2020-11-12 DIAGNOSIS — I130 Hypertensive heart and chronic kidney disease with heart failure and stage 1 through stage 4 chronic kidney disease, or unspecified chronic kidney disease: Secondary | ICD-10-CM | POA: Diagnosis not present

## 2020-11-12 DIAGNOSIS — E039 Hypothyroidism, unspecified: Secondary | ICD-10-CM | POA: Diagnosis not present

## 2020-11-12 DIAGNOSIS — N184 Chronic kidney disease, stage 4 (severe): Secondary | ICD-10-CM | POA: Diagnosis not present

## 2020-11-12 DIAGNOSIS — I509 Heart failure, unspecified: Secondary | ICD-10-CM | POA: Diagnosis not present

## 2020-11-12 DIAGNOSIS — D509 Iron deficiency anemia, unspecified: Secondary | ICD-10-CM | POA: Diagnosis not present

## 2020-11-12 DIAGNOSIS — D631 Anemia in chronic kidney disease: Secondary | ICD-10-CM | POA: Diagnosis not present

## 2020-11-12 DIAGNOSIS — E7800 Pure hypercholesterolemia, unspecified: Secondary | ICD-10-CM | POA: Diagnosis not present

## 2020-11-12 LAB — BPAM RBC
Blood Product Expiration Date: 202207272359
Blood Product Expiration Date: 202207272359
ISSUE DATE / TIME: 202206241235
ISSUE DATE / TIME: 202206271031
Unit Type and Rh: 5100
Unit Type and Rh: 5100

## 2020-11-12 LAB — TYPE AND SCREEN
ABO/RH(D): O POS
Antibody Screen: NEGATIVE
Unit division: 0
Unit division: 0

## 2020-11-12 LAB — SURGICAL PATHOLOGY

## 2020-11-13 ENCOUNTER — Other Ambulatory Visit: Payer: Self-pay

## 2020-11-13 ENCOUNTER — Ambulatory Visit (INDEPENDENT_AMBULATORY_CARE_PROVIDER_SITE_OTHER): Payer: Medicare PPO | Admitting: Cardiology

## 2020-11-13 ENCOUNTER — Encounter: Payer: Self-pay | Admitting: Cardiology

## 2020-11-13 VITALS — BP 110/64 | HR 63 | Ht 64.0 in | Wt 135.8 lb

## 2020-11-13 DIAGNOSIS — I110 Hypertensive heart disease with heart failure: Secondary | ICD-10-CM

## 2020-11-13 DIAGNOSIS — D649 Anemia, unspecified: Secondary | ICD-10-CM | POA: Diagnosis not present

## 2020-11-13 DIAGNOSIS — Z79899 Other long term (current) drug therapy: Secondary | ICD-10-CM | POA: Diagnosis not present

## 2020-11-13 DIAGNOSIS — Z7901 Long term (current) use of anticoagulants: Secondary | ICD-10-CM

## 2020-11-13 DIAGNOSIS — Z1329 Encounter for screening for other suspected endocrine disorder: Secondary | ICD-10-CM | POA: Diagnosis not present

## 2020-11-13 DIAGNOSIS — I5042 Chronic combined systolic (congestive) and diastolic (congestive) heart failure: Secondary | ICD-10-CM

## 2020-11-13 DIAGNOSIS — I5022 Chronic systolic (congestive) heart failure: Secondary | ICD-10-CM | POA: Diagnosis not present

## 2020-11-13 DIAGNOSIS — I480 Paroxysmal atrial fibrillation: Secondary | ICD-10-CM | POA: Diagnosis not present

## 2020-11-13 NOTE — Progress Notes (Signed)
Cardiology Office Note:    Date:  11/13/2020   ID:  Maureen Stout, DOB 05/17/1932, MRN [id number]  PCP:  Nicoletta Dress, MD  Cardiologist:  Shirlee More, MD    Referring MD: Nicoletta Dress, MD she is having frequent lab work performed please do a TSH free T3 and free T4 with her next labs.   ASSESSMENT:    1. Hypertensive heart disease with chronic systolic congestive heart failure (HCC)   2. Paroxysmal atrial fibrillation ([place])   3. On amiodarone therapy   4. Chronic anticoagulation   5. Hypertensive heart disease with chronic combined systolic and diastolic congestive heart failure ([place])   6. Anemia, unspecified type   7. Thyroid disorder screen    PLAN:    In order of problems listed above:  Despite the severity of her anemia her heart failure is stable compensated she has no fluid overload she will continue her current loop diuretic furosemide as well as a vasodilator hydralazine and isosorbide along with her loop diuretic for heart failure. Maintaining sinus rhythm continue low-dose amiodarone and her current anticoagulant renal dose reduced Eliquis with age and CKD Severe refractory anemia requiring transfusion this week and awaiting results of bone marrow biopsy   Next appointment: 6 months   Medication Adjustments/Labs and Tests Ordered: Current medicines are reviewed at length with the patient today.  Concerns regarding medicines are outlined above.  Orders Placed This Encounter  Procedures   EKG 12-Lead   No orders of the defined types were placed in this encounter.   No chief complaint on file.   History of Present Illness:    Maureen Stout is a 87 y.o. female with a hx of atrial fibrillation on amiodarone hypertension dyslipidemia chronic anticoagulation CKD previous severe left ventricular dysfunction with normalization of ejection fraction 12/23/2018.  She was admitted to [facility] February with decompensated heart  failure was found to be anemic with a hemoglobin of 8.1 and her EF at that time was 35 to 40% with left bundle branch block.  She was last seen by me 11/28/2019. Compliance with diet, lifestyle and medications: Yes, her medications are supervised by her family.  She has developed refractory anemia hemoglobin 6.5 requiring outpatient transfusion and had a bone marrow biopsy performed 11/08/2020. Recent creatinine 3 weeks ago 1.9. She is not having edema shortness of breath chest pain palpitation or syncope but complains of weakness.  She is lost approximately 15 to 20 pounds since last seen by me Past Medical History:  Diagnosis Date   Abnormal CXR 04/13/2018   Abnormality of gait 10/19/2018   Acute blood loss anemia    Acute combined systolic and diastolic congestive heart failure (HCC)    Acute on chronic anemia 09/07/2018   Acute on chronic kidney failure (HCC)    Acute pancreatitis without infection or necrosis 09/07/2018   Anemia of chronic disease    Anemia of chronic renal failure    Bacteremia 09/08/2018   Bladder problem    Bradycardia 09/07/2018   Cardiomegaly    Chronic anticoagulation 01/08/2018   CKD (chronic kidney disease), stage III ([place])    Debility 09/19/2018   Diabetes mellitus ([place])    Dyslipidemia    Edema    Esophageal dysmotility    Failure to thrive (child)    First degree AV block 09/06/2018   Gross hematuria 07/05/2017   Hypertension    Hypertensive heart disease 01/08/2018   Hypoglycemia without diagnosis of diabetes mellitus  09/08/2018   Hypothyroidism 09/06/2018   OAB (overactive bladder) 07/05/2017   On amiodarone therapy 01/08/2018   PAH (pulmonary artery hypertension) (HCC)    Paroxysmal A-fib (HCC)    Paroxysmal atrial fibrillation ([place]) 04/18/2017   Paroxysmal atrial flutter (HCC)    Recurrent UTI    Sepsis due to Enterobacter species ([place]) 09/08/2018   Sinus bradycardia    Urinary retention     Past Surgical History:  Procedure Laterality Date    PARTIAL HYSTERECTOMY     TOE SURGERY     TONSILLECTOMY      Current Medications: Current Meds  Medication Sig   amiodarone (PACERONE) 200 MG tablet Take 0.5 tablets (100 mg total) by mouth daily.   ampicillin (PRINCIPEN) 500 MG capsule Take 1 capsule by mouth daily.   apixaban (ELIQUIS) 2.5 MG TABS tablet Take 1 tablet (2.5 mg total) by mouth 2 (two) times daily.   atorvastatin (LIPITOR) 10 MG tablet Take 1 tablet (10 mg total) by mouth daily.   Blood Glucose Monitoring Suppl (TRUE METRIX METER) w/Device KIT    Calcium Carbonate-Vitamin D (CALCIUM-D) 600-400 MG-UNIT TABS Take 1 tablet by mouth 2 (two) times daily.   furosemide (LASIX) 40 MG tablet Take 0.5 tablets by mouth daily.   hydrALAZINE (APRESOLINE) 25 MG tablet Take 0.5 tablets (12.5 mg total) by mouth 3 (three) times daily.   isosorbide dinitrate (ISORDIL) 10 MG tablet Take 1 tablet (10 mg total) by mouth 3 (three) times daily.   levothyroxine (SYNTHROID) 75 MCG tablet Take 1 tablet (75 mcg total) by mouth daily before breakfast.   levothyroxine (SYNTHROID) 88 MCG tablet Take 88 mcg by mouth daily.   Multiple Vitamin (MULTI-VITAMINS) TABS Take 1 tablet by mouth daily.    nitrofurantoin, macrocrystal-monohydrate, (MACROBID) 100 MG capsule Take 100 mg by mouth 2 (two) times daily.   polyethylene glycol (MIRALAX / GLYCOLAX) 17 g packet Take 17 g by mouth daily. For constipation.   silver sulfADIAZINE (SILVADENE) 1 % cream Apply 1 application topically daily.   torsemide (DEMADEX) 20 MG tablet Take 1 tablet (20 mg total) by mouth every other day.   TRUE METRIX BLOOD GLUCOSE TEST test strip SMARTSIG:Via Meter   TRUEplus Lancets 33G MISC      Allergies:   Vancomycin and Uribel [meth-hyo-m bl-na phos-ph sal]   Social History   Socioeconomic History   Marital status: Widowed    Spouse name: Not on file   Number of children: Not on file   Years of education: Not on file   Highest education level: Not on file  Occupational  History   Not on file  Tobacco Use   Smoking status: Never   Smokeless tobacco: Never  Vaping Use   Vaping Use: Never used  Substance and Sexual Activity   Alcohol use: No   Drug use: No   Sexual activity: Not on file  Other Topics Concern   Not on file  Social History Narrative   Not on file   Social Determinants of Health   Financial Resource Strain: Not on file  Food Insecurity: Not on file  Transportation Needs: Not on file  Physical Activity: Not on file  Stress: Not on file  Social Connections: Not on file     Family History: The patient's family history includes Breast cancer in her sister; Cancer in her father. There is no history of Diabetes or Heart attack. ROS:   Please see the history of present illness.    All other systems  reviewed and are negative.  EKGs/Labs/Other Studies Reviewed:    The following studies were reviewed today:  EKG:  EKG ordered today and personally reviewed.  The ekg ordered today demonstrates sinus rhythm left bundle branch block 2 PVCs  Recent Labs: 11/28/2019: TSH 2.260 10/17/2020: ALT 15; BUN 45; Creatinine 1.9; Potassium 4.3; Sodium 137 11/08/2020: Hemoglobin 6.5; Platelets 227  Recent Lipid Panel    Component Value Date/Time   CHOL 151 05/16/2019 1519   TRIG 140 05/16/2019 1519   HDL 53 05/16/2019 1519   CHOLHDL 2.8 05/16/2019 1519   CHOLHDL 2.2 09/08/2018 0529   VLDL 7 09/08/2018 0529   LDLCALC 74 05/16/2019 1519    Physical Exam:    VS:  BP 110/64 (BP Location: Left Arm, Patient Position: Sitting, Cuff Size: Normal)   Pulse 63   Ht _0  (1.626 m)   Wt 135 lb 12.8 oz (61.6 kg)   SpO2 99%   BMI 23.31 kg/m     Wt Readings from Last 3 Encounters:  11/13/20 135 lb 12.8 oz (61.6 kg)  11/08/20 138 lb 12 oz (62.9 kg)  11/08/20 137 lb 11.2 oz (62.5 kg)     GEN: She looks very frail Marked pallor of her skin and membranes well nourished, well developed in no acute distress HEENT: Normal NECK: No JVD; No carotid  bruits LYMPHATICS: No lymphadenopathy CARDIAC: RRR, no murmurs, rubs, gallops RESPIRATORY:  Clear to auscultation without rales, wheezing or rhonchi  ABDOMEN: Soft, non-tender, non-distended MUSCULOSKELETAL:  No edema; No deformity  SKIN: Warm and dry NEUROLOGIC:  Alert and oriented x 3 PSYCHIATRIC:  Normal affect    Signed, Shirlee More, MD  11/13/2020 4:28 PM    [facility]

## 2020-11-13 NOTE — Patient Instructions (Signed)

## 2020-11-14 NOTE — Progress Notes (Signed)
[facility]  [address] [place],  [place]  [address] [phone]  Clinic Day:  11/22/2020  Referring physician: Nicoletta Dress, MD  This document serves as a record of services personally performed by Sheridan Hew Macarthur Critchley, MD. It was created on their behalf by [facility] E, a trained medical scribe. The creation of this record is based on the scribe's personal observations and the provider's statements to them.  HISTORY OF PRESENT ILLNESS:  The patient is an 87 y.o. female with anemia secondary to chronic renal insufficiency.  She comes in today to go over her bone marrow biopsy results to determine the reason behind her refractory anemia.  Despite receiving red cell shots over these past weeks, her hemoglobin continued to fall.  She even required a blood transfusion recently as her hemoglobin fell below 7, despite receiving weekly Retacrit shots.  Although she complains of weakness, she feels okay today  PHYSICAL EXAM:  Blood pressure (!) 152/67, pulse 71, temperature 97.8 F (36.6 C), temperature source Oral, resp. rate 18, height _0  (1.626 m), weight 139 lb 9.6 oz (63.3 kg), SpO2 98 %. Wt Readings from Last 3 Encounters:  12/06/20 126 lb 12 oz (57.5 kg)  11/29/20 136 lb (61.7 kg)  11/22/20 139 lb 9.6 oz (63.3 kg)   Body mass index is 23.96 kg/m. Performance status (ECOG): 2 Physical Exam Constitutional:      Appearance: Normal appearance. She is not ill-appearing.  HENT:     Mouth/Throat:     Mouth: Mucous membranes are moist.     Pharynx: Oropharynx is clear. No oropharyngeal exudate or posterior oropharyngeal erythema.  Cardiovascular:     Rate and Rhythm: Normal rate and regular rhythm.     Heart sounds: No murmur heard.   No friction rub. No gallop.  Pulmonary:     Effort: Pulmonary effort is normal. No respiratory distress.     Breath sounds: Normal breath sounds. No wheezing, rhonchi or rales.  Chest:  Breasts:    Right: No  axillary adenopathy or supraclavicular adenopathy.     Left: No axillary adenopathy or supraclavicular adenopathy.  Abdominal:     General: Bowel sounds are normal. There is no distension.     Palpations: Abdomen is soft. There is no mass.     Tenderness: There is no abdominal tenderness.  Musculoskeletal:        General: No swelling.     Right lower leg: No edema.     Left lower leg: No edema.  Lymphadenopathy:     Cervical: No cervical adenopathy.     Upper Body:     Right upper body: No supraclavicular or axillary adenopathy.     Left upper body: No supraclavicular or axillary adenopathy.     Lower Body: No right inguinal adenopathy. No left inguinal adenopathy.  Skin:    General: Skin is warm.     Coloration: Skin is not jaundiced.     Findings: No lesion or rash.  Neurological:     General: No focal deficit present.     Mental Status: She is alert and oriented to person, place, and time. Mental status is at baseline.     Cranial Nerves: Cranial nerves are intact.  Psychiatric:        Mood and Affect: Mood normal.        Behavior: Behavior normal.        Thought Content: Thought content normal.    LABS:   CBC  Latest Ref Rng & Units 11/08/2020 11/05/2020 10/17/2020  WBC - 4.9 4.6 4.6  Hemoglobin 12.0 - 16.0 6.5(A) 7.5(A) 9.0(A)  Hematocrit 36 - 46 21(A) 24(A) 28(A)  Platelets 150 - 399 227 279 230   CMP Latest Ref Rng & Units 11/22/2020 10/17/2020 08/13/2020  Glucose 65 - 99 mg/dL - - -  BUN 4 - 21 41(A) 45(A) 42(A)  Creatinine 0.5 - 1.1 1.8(A) 1.9(A) 2.0(A)  Sodium 137 - 147 134(A) 137 136(A)  Potassium 3.4 - 5.3 4.5 4.3 4.2  Chloride 99 - 108 101 105 104  CO2 13 - 22 24(A) 27(A) 26(A)  Calcium 8.7 - 10.7 8.3(A) 8.2(A) 8.3(A)  Total Protein g/dL 6.1 - -  Total Bilirubin 0.0 - 1.2 mg/dL - - -  Alkaline Phos 25 - 125 103 122 120  AST 13 - 35 _0 ALT 7 - 35 _1 Surgical Pathology  CASE: [id number]  PATIENT: Maureen Stout  Bone Marrow Report   Clinical  History: refractory anemia   DIAGNOSIS:   BONE MARROW, ASPIRATE, CLOT, CORE:  -Hypercellular bone marrow for age with dyspoietic changes  -Slight plasmacytosis  -See comment   PERIPHERAL BLOOD:  -Normocytic-hypochromic anemia   COMMENT:   The bone marrow is slightly hypercellular for age with dyspoietic  changes primarily involving the erythroid cell line with no ring  sideroblasts.  No increase in blastic cells identified.  The findings  are concerning for a low-grade myelodysplastic syndrome although the  differential diagnosis includes secondary changes due to medication,  alcohol, toxins, nutritional deficiency, immune mediated process, renal  disease, etc. Correlation with cytogenetic and FISH studies is  recommended.  The plasma cells are slightly increased in number  representing 4% of all cells with lack of large aggregates or sheets and  display kappa light chain excess.  Early/minimal involvement by plasma  cell neoplasm is not excluded and hence correlation with serum protein  electrophoresis/immunofixation electrophoresis is recommended.   MICROSCOPIC DESCRIPTION:   PERIPHERAL BLOOD SMEAR: The red blood cells display mild  anisopoikilocytosis with mild polychromasia.  The white blood cells are  normal in number, mostly neutrophils some of which display mild toxic  granulation.  The platelets are normal in number.   BONE MARROW ASPIRATE: Bone marrow particles present  Erythroid precursors: Progressive maturation with dyserythropoiesis  characterized by nuclear cytoplasmic dyssynchrony, irregular nuclei or  lobulated nuclei  Granulocytic precursors: Orderly and progressive maturation for the most part.  Scattered mature neutrophils display mild toxic granulation  Megakaryocytes: Abundant with scattered small hypolobated forms  Lymphocytes/plasma cells: The plasma cells represent 4% of all cells  with lack of large aggregates or sheets.  Some of the plasma cells   display atypical cytologic features with cytomegaly.  Large lymphoid  aggregates are not seen.   TOUCH PREPARATIONS: A mixture of cell types present   CLOT AND BIOPSY: The sections show variable cellularity ranging from 20 to 50% with a mixture of myeloid cell types.  Occasional small  interstitial and well-circumscribed lymphoid aggregates composed of  small lymphoid cells are seen.  Immunohistochemical stain for CD138 and in situ hybridization for kappa and lambda were performed on blocks B1 and C1 with appropriate controls. CD138 highlights the plasma cell component consisting of interstitial cells and small clusters.  Light chain in situ hybridization generally shows a polyclonal staining  pattern in the background for kappa and lambda, although there is kappa light chain excess particularly in larger plasma cells.  IRON STAIN: Iron stains are performed on a bone marrow aspirate or touch imprint smear and section of clot. The controls stained appropriately.        Storage Iron: Present       Ring Sideroblasts: Absent   ADDITIONAL DATA/TESTING: The specimen was sent for cytogenetic analysis and FISH for MDS and a separate report will follow.  Flow cytometric analysis ([id number]) failed to show a definite monoclonal B-cell population or significant T-cell abnormalities.   CELL COUNT DATA:   Bone Marrow count performed on 500 cells shows:  Blasts:   0%   Myeloid:  46%  Promyelocytes: 1%   Erythroid:     39%  Myelocytes:    13%  Lymphocytes:   10%  Metamyelocytes:     3%   Plasma cells:  4%  Bands:    3%  Neutrophils:   23%  M:E ratio:     1.2  Eosinophils:   3%  Basophils:     0%  Monocytes:     1%   Lab Data: CBC performed on 11/08/20 shows:  WBC: 4.9 k/uL  Neutrophils:   69%  Hgb: 6.5 g/dL  Lymphocytes:   20%  HCT: 21 % Monocytes:     7%  MCV: 94 fL     Eosinophils:   2%  RDW: 15.7 %    Basophils:     2%  PLT: 227 k/uL   Flow Pathology Report   DIAGNOSIS:   -No  definite monoclonal B-cell population or significant T-cell  abnormalities identified    GATING AND PHENOTYPIC ANALYSIS:   Gated population: Flow cytometric immunophenotyping is performed using  antibodies to the antigens listed in the table below. Electronic gates  are placed around a cell cluster displaying light scatter properties  corresponding to: lymphocytes   Abnormal Cells in gated population: N/A   Phenotype of Abnormal Cells: N/A                      Lymphoid Antigens       Myeloid Antigens  Miscellaneous  CD2  tested    CD10 tested    CD11b     ND   CD45 tested  CD3  tested    CD19 tested    CD11c     ND   HLA-Dr    ND  CD4  tested    CD20 tested    CD13 ND   CD34 tested  CD5  tested    CD22 ND   CD14 ND   CD38 tested  CD7  tested    CD79b     ND   CD15 ND   CD138     ND  CD8  tested    CD103     ND   CD16 ND   TdT  ND  CD25 ND   CD200     tested    CD33 ND   CD123     ND  TCRab     ND   sKappa    tested    CD64 ND   CD41 ND  TCRgd     tested    sLambda   tested    CD117     ND   CD61 ND  CD56 tested    cKappa    ND   MPO  ND   CD71 ND  CD57 ND   cLambda   ND        [id number]  ASSESSMENT & PLAN:  Assessment/Plan:  An 87 y.o. female whose anemia appears to be due to both renal insufficiency and myelodysplasia.  As her hemoglobin remains the only cell line that is low, I will begin giving the patient both white and red cell shot therapy on a weekly basis, as synergy between the shots has been seen with respect to improving one's hemoglobin.  Her CBC will be checked every 4 weeks to see how well she is responding to combination shot therapy.  I will see her back in 8 weeks for repeat clinical assessment.  The patient understands all the plans discussed today and is in agreement with them.     I, Rita Ohara, am acting as scribe for [name], MD    I have reviewed this report as typed by the medical scribe, and it is complete and accurate.  Winson Eichorn Macarthur Critchley,  MD

## 2020-11-15 ENCOUNTER — Encounter (HOSPITAL_COMMUNITY): Payer: Self-pay | Admitting: Oncology

## 2020-11-15 DIAGNOSIS — E1122 Type 2 diabetes mellitus with diabetic chronic kidney disease: Secondary | ICD-10-CM | POA: Diagnosis not present

## 2020-11-15 DIAGNOSIS — E7800 Pure hypercholesterolemia, unspecified: Secondary | ICD-10-CM | POA: Diagnosis not present

## 2020-11-15 DIAGNOSIS — N184 Chronic kidney disease, stage 4 (severe): Secondary | ICD-10-CM | POA: Diagnosis not present

## 2020-11-15 DIAGNOSIS — D631 Anemia in chronic kidney disease: Secondary | ICD-10-CM | POA: Diagnosis not present

## 2020-11-15 DIAGNOSIS — E039 Hypothyroidism, unspecified: Secondary | ICD-10-CM | POA: Diagnosis not present

## 2020-11-15 DIAGNOSIS — J189 Pneumonia, unspecified organism: Secondary | ICD-10-CM | POA: Diagnosis not present

## 2020-11-15 DIAGNOSIS — I130 Hypertensive heart and chronic kidney disease with heart failure and stage 1 through stage 4 chronic kidney disease, or unspecified chronic kidney disease: Secondary | ICD-10-CM | POA: Diagnosis not present

## 2020-11-15 DIAGNOSIS — D509 Iron deficiency anemia, unspecified: Secondary | ICD-10-CM | POA: Diagnosis not present

## 2020-11-15 DIAGNOSIS — I509 Heart failure, unspecified: Secondary | ICD-10-CM | POA: Diagnosis not present

## 2020-11-19 DIAGNOSIS — N184 Chronic kidney disease, stage 4 (severe): Secondary | ICD-10-CM | POA: Diagnosis not present

## 2020-11-19 DIAGNOSIS — I509 Heart failure, unspecified: Secondary | ICD-10-CM | POA: Diagnosis not present

## 2020-11-19 DIAGNOSIS — E039 Hypothyroidism, unspecified: Secondary | ICD-10-CM | POA: Diagnosis not present

## 2020-11-19 DIAGNOSIS — E7800 Pure hypercholesterolemia, unspecified: Secondary | ICD-10-CM | POA: Diagnosis not present

## 2020-11-19 DIAGNOSIS — J189 Pneumonia, unspecified organism: Secondary | ICD-10-CM | POA: Diagnosis not present

## 2020-11-19 DIAGNOSIS — D509 Iron deficiency anemia, unspecified: Secondary | ICD-10-CM | POA: Diagnosis not present

## 2020-11-19 DIAGNOSIS — I130 Hypertensive heart and chronic kidney disease with heart failure and stage 1 through stage 4 chronic kidney disease, or unspecified chronic kidney disease: Secondary | ICD-10-CM | POA: Diagnosis not present

## 2020-11-19 DIAGNOSIS — D631 Anemia in chronic kidney disease: Secondary | ICD-10-CM | POA: Diagnosis not present

## 2020-11-19 DIAGNOSIS — E1122 Type 2 diabetes mellitus with diabetic chronic kidney disease: Secondary | ICD-10-CM | POA: Diagnosis not present

## 2020-11-20 ENCOUNTER — Encounter (HOSPITAL_COMMUNITY): Payer: Self-pay | Admitting: Oncology

## 2020-11-21 ENCOUNTER — Other Ambulatory Visit: Payer: Self-pay | Admitting: Oncology

## 2020-11-21 DIAGNOSIS — D638 Anemia in other chronic diseases classified elsewhere: Secondary | ICD-10-CM

## 2020-11-21 DIAGNOSIS — I130 Hypertensive heart and chronic kidney disease with heart failure and stage 1 through stage 4 chronic kidney disease, or unspecified chronic kidney disease: Secondary | ICD-10-CM | POA: Diagnosis not present

## 2020-11-21 DIAGNOSIS — D631 Anemia in chronic kidney disease: Secondary | ICD-10-CM | POA: Diagnosis not present

## 2020-11-21 DIAGNOSIS — D509 Iron deficiency anemia, unspecified: Secondary | ICD-10-CM | POA: Diagnosis not present

## 2020-11-21 DIAGNOSIS — E7800 Pure hypercholesterolemia, unspecified: Secondary | ICD-10-CM | POA: Diagnosis not present

## 2020-11-21 DIAGNOSIS — I509 Heart failure, unspecified: Secondary | ICD-10-CM | POA: Diagnosis not present

## 2020-11-21 DIAGNOSIS — E039 Hypothyroidism, unspecified: Secondary | ICD-10-CM | POA: Diagnosis not present

## 2020-11-21 DIAGNOSIS — J189 Pneumonia, unspecified organism: Secondary | ICD-10-CM | POA: Diagnosis not present

## 2020-11-21 DIAGNOSIS — E1122 Type 2 diabetes mellitus with diabetic chronic kidney disease: Secondary | ICD-10-CM | POA: Diagnosis not present

## 2020-11-21 DIAGNOSIS — N184 Chronic kidney disease, stage 4 (severe): Secondary | ICD-10-CM | POA: Diagnosis not present

## 2020-11-22 ENCOUNTER — Inpatient Hospital Stay: Payer: Medicare PPO

## 2020-11-22 ENCOUNTER — Encounter: Payer: Self-pay | Admitting: Oncology

## 2020-11-22 ENCOUNTER — Other Ambulatory Visit: Payer: Self-pay | Admitting: Oncology

## 2020-11-22 ENCOUNTER — Other Ambulatory Visit: Payer: Self-pay

## 2020-11-22 ENCOUNTER — Telehealth: Payer: Self-pay | Admitting: Oncology

## 2020-11-22 ENCOUNTER — Other Ambulatory Visit: Payer: Self-pay | Admitting: Hematology and Oncology

## 2020-11-22 ENCOUNTER — Inpatient Hospital Stay: Payer: Medicare PPO | Attending: Oncology | Admitting: Oncology

## 2020-11-22 VITALS — BP 146/69 | HR 66 | Temp 97.8°F | Resp 18 | Ht 64.0 in | Wt 139.6 lb

## 2020-11-22 DIAGNOSIS — N189 Chronic kidney disease, unspecified: Secondary | ICD-10-CM | POA: Diagnosis not present

## 2020-11-22 DIAGNOSIS — D631 Anemia in chronic kidney disease: Secondary | ICD-10-CM | POA: Insufficient documentation

## 2020-11-22 DIAGNOSIS — D469 Myelodysplastic syndrome, unspecified: Secondary | ICD-10-CM | POA: Insufficient documentation

## 2020-11-22 DIAGNOSIS — N184 Chronic kidney disease, stage 4 (severe): Secondary | ICD-10-CM | POA: Diagnosis not present

## 2020-11-22 DIAGNOSIS — D638 Anemia in other chronic diseases classified elsewhere: Secondary | ICD-10-CM

## 2020-11-22 DIAGNOSIS — D649 Anemia, unspecified: Secondary | ICD-10-CM | POA: Diagnosis not present

## 2020-11-22 DIAGNOSIS — Z79899 Other long term (current) drug therapy: Secondary | ICD-10-CM | POA: Diagnosis not present

## 2020-11-22 DIAGNOSIS — D4620 Refractory anemia with excess of blasts, unspecified: Secondary | ICD-10-CM

## 2020-11-22 LAB — BASIC METABOLIC PANEL
BUN: 41 — AB (ref 4–21)
CO2: 24 — AB (ref 13–22)
Chloride: 101 (ref 99–108)
Creatinine: 1.8 — AB (ref 0.5–1.1)
Glucose: 90
Potassium: 4.5 (ref 3.4–5.3)
Sodium: 134 — AB (ref 137–147)

## 2020-11-22 LAB — HEPATIC FUNCTION PANEL
ALT: 14 (ref 7–35)
AST: 27 (ref 13–35)
Alkaline Phosphatase: 103 (ref 25–125)
Bilirubin, Total: 0.2

## 2020-11-22 LAB — CORRECTED CALCIUM (CC13): Calcium, Corrected: 8.9

## 2020-11-22 LAB — COMPREHENSIVE METABOLIC PANEL
Albumin: 3.4 — AB (ref 3.5–5.0)
Calcium: 8.3 — AB (ref 8.7–10.7)

## 2020-11-22 LAB — PROTEIN, TOTAL: Total Protein: 6.1 g/dL

## 2020-11-22 MED ORDER — EPOETIN ALFA-EPBX 40000 UNIT/ML IJ SOLN
INTRAMUSCULAR | Status: AC
  Filled 2020-11-22: qty 1

## 2020-11-22 MED ORDER — EPOETIN ALFA-EPBX 40000 UNIT/ML IJ SOLN
40000.0000 [IU] | Freq: Once | INTRAMUSCULAR | Status: AC
  Administered 2020-11-22: 40000 [IU] via SUBCUTANEOUS

## 2020-11-22 NOTE — Telephone Encounter (Signed)
Per 7/8 los next appt scheduled and confirmed by patient

## 2020-11-22 NOTE — Patient Instructions (Signed)
Epoetin Alfa injection What is this medication? EPOETIN ALFA (e POE e tin AL fa) helps your body make more red blood cells. This medicine is used to treat anemia caused by chronic kidney disease, cancer chemotherapy, or HIV-therapy. It may also be used before surgery if you have anemia. This medicine may be used for other purposes; ask your health care provider or pharmacist if you have questions. COMMON BRAND NAME(S): Epogen, Procrit, Retacrit What should I tell my care team before I take this medication? They need to know if you have any of these conditions: cancer heart disease high blood pressure history of blood clots history of stroke low levels of folate, iron, or vitamin B12 in the blood seizures an unusual or allergic reaction to erythropoietin, albumin, benzyl alcohol, hamster proteins, other medicines, foods, dyes, or preservatives pregnant or trying to get pregnant breast-feeding How should I use this medication? This medicine is for injection into a vein or under the skin. It is usually given by a health care professional in a hospital or clinic setting. If you get this medicine at home, you will be taught how to prepare and give this medicine. Use exactly as directed. Take your medicine at regular intervals. Do not take your medicine more often than directed. It is important that you put your used needles and syringes in a special sharps container. Do not put them in a trash can. If you do not have a sharps container, call your pharmacist or healthcare provider to get one. A special MedGuide will be given to you by the pharmacist with each prescription and refill. Be sure to read this information carefully each time. Talk to your pediatrician regarding the use of this medicine in children. While this drug may be prescribed for selected conditions, precautions do apply. Overdosage: If you think you have taken too much of this medicine contact a poison control center or emergency  room at once. NOTE: This medicine is only for you. Do not share this medicine with others. What if I miss a dose? If you miss a dose, take it as soon as you can. If it is almost time for your next dose, take only that dose. Do not take double or extra doses. What may interact with this medication? Interactions have not been studied. This list may not describe all possible interactions. Give your health care provider a list of all the medicines, herbs, non-prescription drugs, or dietary supplements you use. Also tell them if you smoke, drink alcohol, or use illegal drugs. Some items may interact with your medicine. What should I watch for while using this medication? Your condition will be monitored carefully while you are receiving this medicine. You may need blood work done while you are taking this medicine. This medicine may cause a decrease in vitamin B6. You should make sure that you get enough vitamin B6 while you are taking this medicine. Discuss the foods you eat and the vitamins you take with your health care professional. What side effects may I notice from receiving this medication? Side effects that you should report to your doctor or health care professional as soon as possible: allergic reactions like skin rash, itching or hives, swelling of the face, lips, or tongue seizures signs and symptoms of a blood clot such as breathing problems; changes in vision; chest pain; severe, sudden headache; pain, swelling, warmth in the leg; trouble speaking; sudden numbness or weakness of the face, arm or leg signs and symptoms of a stroke like   changes in vision; confusion; trouble speaking or understanding; severe headaches; sudden numbness or weakness of the face, arm or leg; trouble walking; dizziness; loss of balance or coordination Side effects that usually do not require medical attention (report to your doctor or health care professional if they continue or are  bothersome): chills cough dizziness fever headaches joint pain muscle cramps muscle pain nausea, vomiting pain, redness, or irritation at site where injected This list may not describe all possible side effects. Call your doctor for medical advice about side effects. You may report side effects to FDA at 1-800-FDA-1088. Where should I keep my medication? Keep out of the reach of children. Store in a refrigerator between 2 and 8 degrees C (36 and 46 degrees F). Do not freeze or shake. Throw away any unused portion if using a single-dose vial. Multi-dose vials can be kept in the refrigerator for up to 21 days after the initial dose. Throw away unused medicine. NOTE: This sheet is a summary. It may not cover all possible information. If you have questions about this medicine, talk to your doctor, pharmacist, or health care provider.  2022 Elsevier/Gold Standard (2016-12-11 08:35:19)  

## 2020-11-26 DIAGNOSIS — E1122 Type 2 diabetes mellitus with diabetic chronic kidney disease: Secondary | ICD-10-CM | POA: Diagnosis not present

## 2020-11-26 DIAGNOSIS — I130 Hypertensive heart and chronic kidney disease with heart failure and stage 1 through stage 4 chronic kidney disease, or unspecified chronic kidney disease: Secondary | ICD-10-CM | POA: Diagnosis not present

## 2020-11-26 DIAGNOSIS — J189 Pneumonia, unspecified organism: Secondary | ICD-10-CM | POA: Diagnosis not present

## 2020-11-26 DIAGNOSIS — I509 Heart failure, unspecified: Secondary | ICD-10-CM | POA: Diagnosis not present

## 2020-11-26 DIAGNOSIS — N184 Chronic kidney disease, stage 4 (severe): Secondary | ICD-10-CM | POA: Diagnosis not present

## 2020-11-26 DIAGNOSIS — D631 Anemia in chronic kidney disease: Secondary | ICD-10-CM | POA: Diagnosis not present

## 2020-11-26 DIAGNOSIS — E7800 Pure hypercholesterolemia, unspecified: Secondary | ICD-10-CM | POA: Diagnosis not present

## 2020-11-26 DIAGNOSIS — D509 Iron deficiency anemia, unspecified: Secondary | ICD-10-CM | POA: Diagnosis not present

## 2020-11-26 DIAGNOSIS — E039 Hypothyroidism, unspecified: Secondary | ICD-10-CM | POA: Diagnosis not present

## 2020-11-27 ENCOUNTER — Telehealth: Payer: Self-pay | Admitting: *Deleted

## 2020-11-27 ENCOUNTER — Encounter: Payer: Self-pay | Admitting: Oncology

## 2020-11-27 NOTE — Telephone Encounter (Signed)
Called and left a message for Daleen Snook from [facility] and stated to use the silvadene cream and if the wounds fail to improve call the office per Dr Cannon Kettle. Lattie Haw

## 2020-11-27 NOTE — Telephone Encounter (Signed)
-----   Message from Landis Martins, [place] sent at 11/22/2020  1:57 PM EDT ----- Ok yes have her use silvadene and if wound fails to improve call office back Thanks Dr. Chauncey Cruel ----- Message ----- From: Viviana Simpler, [facility] Sent: 11/22/2020   1:14 PM EDT To: Landis Martins, DPM  Kristin from [facility] called and stated that the patient had an open seeping wound on the left 2nd toe and patient was not using the silvadene and I called and left a message for Erasmo Downer just stating to get the patient to use the silvadene cream and patient has an appointment on 12-04-2020 and I got Wells Guiles to see about bringing patient in earlier but there is not any where to put patient. Lattie Haw

## 2020-11-29 ENCOUNTER — Inpatient Hospital Stay: Payer: Medicare PPO

## 2020-11-29 ENCOUNTER — Other Ambulatory Visit: Payer: Self-pay

## 2020-11-29 VITALS — BP 118/46 | HR 72 | Temp 98.0°F | Resp 18 | Ht 64.0 in | Wt 136.0 lb

## 2020-11-29 DIAGNOSIS — I130 Hypertensive heart and chronic kidney disease with heart failure and stage 1 through stage 4 chronic kidney disease, or unspecified chronic kidney disease: Secondary | ICD-10-CM | POA: Diagnosis not present

## 2020-11-29 DIAGNOSIS — D631 Anemia in chronic kidney disease: Secondary | ICD-10-CM | POA: Diagnosis not present

## 2020-11-29 DIAGNOSIS — N189 Chronic kidney disease, unspecified: Secondary | ICD-10-CM | POA: Diagnosis not present

## 2020-11-29 DIAGNOSIS — N184 Chronic kidney disease, stage 4 (severe): Secondary | ICD-10-CM

## 2020-11-29 DIAGNOSIS — E7800 Pure hypercholesterolemia, unspecified: Secondary | ICD-10-CM | POA: Diagnosis not present

## 2020-11-29 DIAGNOSIS — D469 Myelodysplastic syndrome, unspecified: Secondary | ICD-10-CM | POA: Diagnosis not present

## 2020-11-29 DIAGNOSIS — J189 Pneumonia, unspecified organism: Secondary | ICD-10-CM | POA: Diagnosis not present

## 2020-11-29 DIAGNOSIS — E039 Hypothyroidism, unspecified: Secondary | ICD-10-CM | POA: Diagnosis not present

## 2020-11-29 DIAGNOSIS — Z79899 Other long term (current) drug therapy: Secondary | ICD-10-CM | POA: Diagnosis not present

## 2020-11-29 DIAGNOSIS — I509 Heart failure, unspecified: Secondary | ICD-10-CM | POA: Diagnosis not present

## 2020-11-29 DIAGNOSIS — D509 Iron deficiency anemia, unspecified: Secondary | ICD-10-CM | POA: Diagnosis not present

## 2020-11-29 DIAGNOSIS — E1122 Type 2 diabetes mellitus with diabetic chronic kidney disease: Secondary | ICD-10-CM | POA: Diagnosis not present

## 2020-11-29 MED ORDER — EPOETIN ALFA-EPBX 40000 UNIT/ML IJ SOLN
40000.0000 [IU] | Freq: Once | INTRAMUSCULAR | Status: AC
  Administered 2020-11-29: 40000 [IU] via SUBCUTANEOUS

## 2020-11-29 MED ORDER — FILGRASTIM-SNDZ 480 MCG/0.8ML IJ SOSY
480.0000 ug | PREFILLED_SYRINGE | Freq: Once | INTRAMUSCULAR | Status: AC
  Administered 2020-11-29: 480 ug via SUBCUTANEOUS

## 2020-11-29 MED ORDER — EPOETIN ALFA-EPBX 40000 UNIT/ML IJ SOLN
INTRAMUSCULAR | Status: AC
  Filled 2020-11-29: qty 1

## 2020-11-29 MED ORDER — FILGRASTIM-SNDZ 480 MCG/0.8ML IJ SOSY
PREFILLED_SYRINGE | INTRAMUSCULAR | Status: AC
  Filled 2020-11-29: qty 0.8

## 2020-11-29 NOTE — Patient Instructions (Signed)
Epoetin Alfa injection What is this medication? EPOETIN ALFA (e POE e tin AL fa) helps your body make more red blood cells. This medicine is used to treat anemia caused by chronic kidney disease, cancer chemotherapy, or HIV-therapy. It may also be used before surgery if you have anemia. This medicine may be used for other purposes; ask your health care provider or pharmacist if you have questions. COMMON BRAND NAME(S): Epogen, Procrit, Retacrit What should I tell my care team before I take this medication? They need to know if you have any of these conditions: cancer heart disease high blood pressure history of blood clots history of stroke low levels of folate, iron, or vitamin B12 in the blood seizures an unusual or allergic reaction to erythropoietin, albumin, benzyl alcohol, hamster proteins, other medicines, foods, dyes, or preservatives pregnant or trying to get pregnant breast-feeding How should I use this medication? This medicine is for injection into a vein or under the skin. It is usually given by a health care professional in a hospital or clinic setting. If you get this medicine at home, you will be taught how to prepare and give this medicine. Use exactly as directed. Take your medicine at regular intervals. Do not take your medicine more often than directed. It is important that you put your used needles and syringes in a special sharps container. Do not put them in a trash can. If you do not have a sharps container, call your pharmacist or healthcare provider to get one. A special MedGuide will be given to you by the pharmacist with each prescription and refill. Be sure to read this information carefully each time. Talk to your pediatrician regarding the use of this medicine in children. While this drug may be prescribed for selected conditions, precautions do apply. Overdosage: If you think you have taken too much of this medicine contact a poison control center or emergency  room at once. NOTE: This medicine is only for you. Do not share this medicine with others. What if I miss a dose? If you miss a dose, take it as soon as you can. If it is almost time for your next dose, take only that dose. Do not take double or extra doses. What may interact with this medication? Interactions have not been studied. This list may not describe all possible interactions. Give your health care provider a list of all the medicines, herbs, non-prescription drugs, or dietary supplements you use. Also tell them if you smoke, drink alcohol, or use illegal drugs. Some items may interact with your medicine. What should I watch for while using this medication? Your condition will be monitored carefully while you are receiving this medicine. You may need blood work done while you are taking this medicine. This medicine may cause a decrease in vitamin B6. You should make sure that you get enough vitamin B6 while you are taking this medicine. Discuss the foods you eat and the vitamins you take with your health care professional. What side effects may I notice from receiving this medication? Side effects that you should report to your doctor or health care professional as soon as possible: allergic reactions like skin rash, itching or hives, swelling of the face, lips, or tongue seizures signs and symptoms of a blood clot such as breathing problems; changes in vision; chest pain; severe, sudden headache; pain, swelling, warmth in the leg; trouble speaking; sudden numbness or weakness of the face, arm or leg signs and symptoms of a stroke like   changes in vision; confusion; trouble speaking or understanding; severe headaches; sudden numbness or weakness of the face, arm or leg; trouble walking; dizziness; loss of balance or coordination Side effects that usually do not require medical attention (report to your doctor or health care professional if they continue or are  bothersome): chills cough dizziness fever headaches joint pain muscle cramps muscle pain nausea, vomiting pain, redness, or irritation at site where injected This list may not describe all possible side effects. Call your doctor for medical advice about side effects. You may report side effects to FDA at 1-800-FDA-1088. Where should I keep my medication? Keep out of the reach of children. Store in a refrigerator between 2 and 8 degrees C (36 and 46 degrees F). Do not freeze or shake. Throw away any unused portion if using a single-dose vial. Multi-dose vials can be kept in the refrigerator for up to 21 days after the initial dose. Throw away unused medicine. NOTE: This sheet is a summary. It may not cover all possible information. If you have questions about this medicine, talk to your doctor, pharmacist, or health care provider.  2022 Elsevier/Gold Standard (2016-12-11 08:35:19) Filgrastim, G-CSF injection What is this medication? FILGRASTIM, G-CSF (fil GRA stim) is a granulocyte colony-stimulating factor that stimulates the growth of neutrophils, a type of white blood cell (WBC) important in the body's fight against infection. It is used to reduce the incidence of fever and infection in patients with certain types of cancer who are receiving chemotherapy that affects the bone marrow, to stimulate blood cell production for removal of WBCs from the body prior to a bone marrow transplantation, to reduce the incidence of fever and infection in patients who have severe chronic neutropenia, and to improve survival outcomes following high-dose radiation exposure that is toxic to the bone marrow. This medicine may be used for other purposes; ask your health care provider or pharmacist if you have questions. COMMON BRAND NAME(S): Neupogen, Nivestym, Releuko, Zarxio What should I tell my care team before I take this medication? They need to know if you have any of these conditions: kidney  disease latex allergy ongoing radiation therapy sickle cell disease an unusual or allergic reaction to filgrastim, pegfilgrastim, other medicines, foods, dyes, or preservatives pregnant or trying to get pregnant breast-feeding How should I use this medication? This medicine is for injection under the skin or infusion into a vein. As an infusion into a vein, it is usually given by a health care professional in a hospital or clinic setting. If you get this medicine at home, you will be taught how to prepare and give this medicine. Refer to the Instructions for Use that come with your medication packaging. Use exactly as directed. Take your medicine at regular intervals. Do not take your medicine more often than directed. It is important that you put your used needles and syringes in a special sharps container. Do not put them in a trash can. If you do not have a sharps container, call your pharmacist or healthcare provider to get one. Talk to your pediatrician regarding the use of this medicine in children. While this drug may be prescribed for children as young as 7 months for selected conditions, precautions do apply. Overdosage: If you think you have taken too much of this medicine contact a poison control center or emergency room at once. NOTE: This medicine is only for you. Do not share this medicine with others. What if I miss a dose? It is important not   to miss your dose. Call your doctor or health care professional if you miss a dose. What may interact with this medication? This medicine may interact with the following medications: medicines that may cause a release of neutrophils, such as lithium This list may not describe all possible interactions. Give your health care provider a list of all the medicines, herbs, non-prescription drugs, or dietary supplements you use. Also tell them if you smoke, drink alcohol, or use illegal drugs. Some items may interact with your medicine. What should  I watch for while using this medication? Your condition will be monitored carefully while you are receiving this medicine. You may need blood work done while you are taking this medicine. Talk to your health care provider about your risk of cancer. You may be more at risk for certain types of cancer if you take this medicine. What side effects may I notice from receiving this medication? Side effects that you should report to your doctor or health care professional as soon as possible: allergic reactions like skin rash, itching or hives, swelling of the face, lips, or tongue back pain dizziness or feeling faint fever pain, redness, or irritation at site where injected pinpoint red spots on the skin shortness of breath or breathing problems signs and symptoms of kidney injury like trouble passing urine, change in the amount of urine, or red or dark-brown urine stomach or side pain, or pain at the shoulder swelling tiredness unusual bleeding or bruising Side effects that usually do not require medical attention (report to your doctor or health care professional if they continue or are bothersome): bone pain cough diarrhea hair loss headache muscle pain This list may not describe all possible side effects. Call your doctor for medical advice about side effects. You may report side effects to FDA at 1-800-FDA-1088. Where should I keep my medication? Keep out of the reach of children. Store in a refrigerator between 2 and 8 degrees C (36 and 46 degrees F). Do not freeze. Keep in carton to protect from light. Throw away this medicine if vials or syringes are left out of the refrigerator for more than 24 hours. Throw away any unused medicine after the expiration date. NOTE: This sheet is a summary. It may not cover all possible information. If you have questions about this medicine, talk to your doctor, pharmacist, or health care provider.  2022 Elsevier/Gold Standard (2019-05-25  18:47:55)  

## 2020-12-04 ENCOUNTER — Encounter: Payer: Self-pay | Admitting: Sports Medicine

## 2020-12-04 ENCOUNTER — Ambulatory Visit (INDEPENDENT_AMBULATORY_CARE_PROVIDER_SITE_OTHER): Payer: Medicare PPO | Admitting: Sports Medicine

## 2020-12-04 ENCOUNTER — Other Ambulatory Visit: Payer: Self-pay

## 2020-12-04 DIAGNOSIS — L97511 Non-pressure chronic ulcer of other part of right foot limited to breakdown of skin: Secondary | ICD-10-CM

## 2020-12-04 DIAGNOSIS — M79674 Pain in right toe(s): Secondary | ICD-10-CM | POA: Diagnosis not present

## 2020-12-04 DIAGNOSIS — B351 Tinea unguium: Secondary | ICD-10-CM | POA: Diagnosis not present

## 2020-12-04 DIAGNOSIS — L97521 Non-pressure chronic ulcer of other part of left foot limited to breakdown of skin: Secondary | ICD-10-CM

## 2020-12-04 DIAGNOSIS — M79675 Pain in left toe(s): Secondary | ICD-10-CM | POA: Diagnosis not present

## 2020-12-04 DIAGNOSIS — E1140 Type 2 diabetes mellitus with diabetic neuropathy, unspecified: Secondary | ICD-10-CM

## 2020-12-04 DIAGNOSIS — L84 Corns and callosities: Secondary | ICD-10-CM

## 2020-12-04 NOTE — Progress Notes (Signed)
Subjective: Maureen Stout is a 87 y.o. female patient with history of diabetes who returns to office today for diabetic nail and callus care.  Denies any bleeding from preulcerative callus lesion bottom of right foot or left foot and reports that she has been doing the Silvadene cream when she can remember.  Reports that her blood sugar was 97 this morning does not know her last A1c and last visit to PCP was 6 weeks ago.   Objective: General: Patient is awake, alert, and oriented x 3 and in no acute distress.  Integument: Skin is warm, dry and supple bilateral. Nails are elongated thickened and dystrophic with subungual debris, consistent with onychomycosis, 1-5 on right 1,2,4,5 on left.  Callus with pinpoint bleeding noted to the left plantar forefoot submet 1 and left second toe, measures 0.5x0.5cm, and submet 1 on right that measures 0.3x0.3cm, all with a granular base, no significant redness, no significant warmth, no malodor, there is also a preulcerative lesion at the left second toe.  No acute signs of infection.    Vasculature:  Dorsalis Pedis pulse 1/4 bilateral. Posterior Tibial pulse  1/4 bilateral. Capillary fill time <3 sec 1-5 bilateral. Scant hair growth to the level of the digits.Temperature gradient within normal limits. Mild varicosities present bilateral. No edema present bilateral.   Neurology: The patient has absent sensation measured with a 5.07/10g Semmes Weinstein Monofilament at all pedal sites bilateral. Vibratory sensation absent bilateral with tuning fork. No Babinski sign present bilateral.   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 Asymptomatic bunion and hammertoe pedal deformities noted bilateral. Muscular strength 5/5 in all lower extremity muscular groups bilateral without pain on range of motion. No tenderness with calf compression bilateral.  Assessment and plan Problem List Items Addressed This Visit   None Visit Diagnoses     Pain due to onychomycosis  of toenails of both feet    -  Primary   Pre-ulcerative calluses       Foot ulcer, left, limited to breakdown of skin (HCC)       Foot ulcer, limited to breakdown of skin, right (HCC)       Type 2 diabetes, controlled, with neuropathy ([place])           -Examined patient. -Re-Discussed and educated patient on diabetic foot care, especially with regards to the vascular, neurological and musculoskeletal systems.  -Mechanically debrided all nails x9 using sterile nail nipper without incident and smooth with rotary bur  -At no additional charge mechanically debrided plantar ulcer left second toe and left and right forefoot submet 1 using a sterile chisel blade without incident and applied Silvadene ointment and Band-Aid and advised patient to refrain from walking barefoot like previous and to use house shoes or tennis shoes however patient still continues to wear just gripping socks and not shoes as I have recommended -Return to office in 10-12 weeks for nail care or sooner if problems or issues arise -Patient advised to call the office if any problems or questions arise in the meantime.  Landis Martins, DPM

## 2020-12-05 DIAGNOSIS — E7800 Pure hypercholesterolemia, unspecified: Secondary | ICD-10-CM | POA: Diagnosis not present

## 2020-12-05 DIAGNOSIS — I509 Heart failure, unspecified: Secondary | ICD-10-CM | POA: Diagnosis not present

## 2020-12-05 DIAGNOSIS — E1122 Type 2 diabetes mellitus with diabetic chronic kidney disease: Secondary | ICD-10-CM | POA: Diagnosis not present

## 2020-12-05 DIAGNOSIS — I130 Hypertensive heart and chronic kidney disease with heart failure and stage 1 through stage 4 chronic kidney disease, or unspecified chronic kidney disease: Secondary | ICD-10-CM | POA: Diagnosis not present

## 2020-12-05 DIAGNOSIS — J189 Pneumonia, unspecified organism: Secondary | ICD-10-CM | POA: Diagnosis not present

## 2020-12-05 DIAGNOSIS — D631 Anemia in chronic kidney disease: Secondary | ICD-10-CM | POA: Diagnosis not present

## 2020-12-05 DIAGNOSIS — N184 Chronic kidney disease, stage 4 (severe): Secondary | ICD-10-CM | POA: Diagnosis not present

## 2020-12-05 DIAGNOSIS — D509 Iron deficiency anemia, unspecified: Secondary | ICD-10-CM | POA: Diagnosis not present

## 2020-12-05 DIAGNOSIS — E039 Hypothyroidism, unspecified: Secondary | ICD-10-CM | POA: Diagnosis not present

## 2020-12-06 ENCOUNTER — Other Ambulatory Visit: Payer: Self-pay

## 2020-12-06 ENCOUNTER — Inpatient Hospital Stay: Payer: Medicare PPO

## 2020-12-06 VITALS — BP 120/52 | HR 67 | Temp 97.9°F | Resp 18 | Ht 64.0 in | Wt 126.8 lb

## 2020-12-06 DIAGNOSIS — Z79899 Other long term (current) drug therapy: Secondary | ICD-10-CM | POA: Diagnosis not present

## 2020-12-06 DIAGNOSIS — D631 Anemia in chronic kidney disease: Secondary | ICD-10-CM

## 2020-12-06 DIAGNOSIS — D469 Myelodysplastic syndrome, unspecified: Secondary | ICD-10-CM | POA: Diagnosis not present

## 2020-12-06 DIAGNOSIS — N184 Chronic kidney disease, stage 4 (severe): Secondary | ICD-10-CM

## 2020-12-06 DIAGNOSIS — N189 Chronic kidney disease, unspecified: Secondary | ICD-10-CM | POA: Diagnosis not present

## 2020-12-06 MED ORDER — FILGRASTIM-SNDZ 480 MCG/0.8ML IJ SOSY
480.0000 ug | PREFILLED_SYRINGE | Freq: Once | INTRAMUSCULAR | Status: AC
  Administered 2020-12-06: 480 ug via SUBCUTANEOUS

## 2020-12-06 MED ORDER — EPOETIN ALFA-EPBX 40000 UNIT/ML IJ SOLN
INTRAMUSCULAR | Status: AC
  Filled 2020-12-06: qty 1

## 2020-12-06 MED ORDER — FILGRASTIM-SNDZ 480 MCG/0.8ML IJ SOSY
PREFILLED_SYRINGE | INTRAMUSCULAR | Status: AC
  Filled 2020-12-06: qty 0.8

## 2020-12-06 MED ORDER — EPOETIN ALFA-EPBX 40000 UNIT/ML IJ SOLN
40000.0000 [IU] | Freq: Once | INTRAMUSCULAR | Status: AC
  Administered 2020-12-06: 40000 [IU] via SUBCUTANEOUS

## 2020-12-06 NOTE — Patient Instructions (Signed)
Epoetin Alfa injection What is this medication? EPOETIN ALFA (e POE e tin AL fa) helps your body make more red blood cells. This medicine is used to treat anemia caused by chronic kidney disease, cancer chemotherapy, or HIV-therapy. It may also be used before surgery if you have anemia. This medicine may be used for other purposes; ask your health care provider or pharmacist if you have questions. COMMON BRAND NAME(S): Epogen, Procrit, Retacrit What should I tell my care team before I take this medication? They need to know if you have any of these conditions: cancer heart disease high blood pressure history of blood clots history of stroke low levels of folate, iron, or vitamin B12 in the blood seizures an unusual or allergic reaction to erythropoietin, albumin, benzyl alcohol, hamster proteins, other medicines, foods, dyes, or preservatives pregnant or trying to get pregnant breast-feeding How should I use this medication? This medicine is for injection into a vein or under the skin. It is usually given by a health care professional in a hospital or clinic setting. If you get this medicine at home, you will be taught how to prepare and give this medicine. Use exactly as directed. Take your medicine at regular intervals. Do not take your medicine more often than directed. It is important that you put your used needles and syringes in a special sharps container. Do not put them in a trash can. If you do not have a sharps container, call your pharmacist or healthcare provider to get one. A special MedGuide will be given to you by the pharmacist with each prescription and refill. Be sure to read this information carefully each time. Talk to your pediatrician regarding the use of this medicine in children. While this drug may be prescribed for selected conditions, precautions do apply. Overdosage: If you think you have taken too much of this medicine contact a poison control center or emergency  room at once. NOTE: This medicine is only for you. Do not share this medicine with others. What if I miss a dose? If you miss a dose, take it as soon as you can. If it is almost time for your next dose, take only that dose. Do not take double or extra doses. What may interact with this medication? Interactions have not been studied. This list may not describe all possible interactions. Give your health care provider a list of all the medicines, herbs, non-prescription drugs, or dietary supplements you use. Also tell them if you smoke, drink alcohol, or use illegal drugs. Some items may interact with your medicine. What should I watch for while using this medication? Your condition will be monitored carefully while you are receiving this medicine. You may need blood work done while you are taking this medicine. This medicine may cause a decrease in vitamin B6. You should make sure that you get enough vitamin B6 while you are taking this medicine. Discuss the foods you eat and the vitamins you take with your health care professional. What side effects may I notice from receiving this medication? Side effects that you should report to your doctor or health care professional as soon as possible: allergic reactions like skin rash, itching or hives, swelling of the face, lips, or tongue seizures signs and symptoms of a blood clot such as breathing problems; changes in vision; chest pain; severe, sudden headache; pain, swelling, warmth in the leg; trouble speaking; sudden numbness or weakness of the face, arm or leg signs and symptoms of a stroke like   changes in vision; confusion; trouble speaking or understanding; severe headaches; sudden numbness or weakness of the face, arm or leg; trouble walking; dizziness; loss of balance or coordination Side effects that usually do not require medical attention (report to your doctor or health care professional if they continue or are  bothersome): chills cough dizziness fever headaches joint pain muscle cramps muscle pain nausea, vomiting pain, redness, or irritation at site where injected This list may not describe all possible side effects. Call your doctor for medical advice about side effects. You may report side effects to FDA at 1-800-FDA-1088. Where should I keep my medication? Keep out of the reach of children. Store in a refrigerator between 2 and 8 degrees C (36 and 46 degrees F). Do not freeze or shake. Throw away any unused portion if using a single-dose vial. Multi-dose vials can be kept in the refrigerator for up to 21 days after the initial dose. Throw away unused medicine. NOTE: This sheet is a summary. It may not cover all possible information. If you have questions about this medicine, talk to your doctor, pharmacist, or health care provider.  2022 Elsevier/Gold Standard (2016-12-11 08:35:19) Filgrastim, G-CSF injection What is this medication? FILGRASTIM, G-CSF (fil GRA stim) is a granulocyte colony-stimulating factor that stimulates the growth of neutrophils, a type of white blood cell (WBC) important in the body's fight against infection. It is used to reduce the incidence of fever and infection in patients with certain types of cancer who are receiving chemotherapy that affects the bone marrow, to stimulate blood cell production for removal of WBCs from the body prior to a bone marrow transplantation, to reduce the incidence of fever and infection in patients who have severe chronic neutropenia, and to improve survival outcomes following high-dose radiation exposure that is toxic to the bone marrow. This medicine may be used for other purposes; ask your health care provider or pharmacist if you have questions. COMMON BRAND NAME(S): Neupogen, Nivestym, Releuko, Zarxio What should I tell my care team before I take this medication? They need to know if you have any of these conditions: kidney  disease latex allergy ongoing radiation therapy sickle cell disease an unusual or allergic reaction to filgrastim, pegfilgrastim, other medicines, foods, dyes, or preservatives pregnant or trying to get pregnant breast-feeding How should I use this medication? This medicine is for injection under the skin or infusion into a vein. As an infusion into a vein, it is usually given by a health care professional in a hospital or clinic setting. If you get this medicine at home, you will be taught how to prepare and give this medicine. Refer to the Instructions for Use that come with your medication packaging. Use exactly as directed. Take your medicine at regular intervals. Do not take your medicine more often than directed. It is important that you put your used needles and syringes in a special sharps container. Do not put them in a trash can. If you do not have a sharps container, call your pharmacist or healthcare provider to get one. Talk to your pediatrician regarding the use of this medicine in children. While this drug may be prescribed for children as young as 7 months for selected conditions, precautions do apply. Overdosage: If you think you have taken too much of this medicine contact a poison control center or emergency room at once. NOTE: This medicine is only for you. Do not share this medicine with others. What if I miss a dose? It is important not   to miss your dose. Call your doctor or health care professional if you miss a dose. What may interact with this medication? This medicine may interact with the following medications: medicines that may cause a release of neutrophils, such as lithium This list may not describe all possible interactions. Give your health care provider a list of all the medicines, herbs, non-prescription drugs, or dietary supplements you use. Also tell them if you smoke, drink alcohol, or use illegal drugs. Some items may interact with your medicine. What should  I watch for while using this medication? Your condition will be monitored carefully while you are receiving this medicine. You may need blood work done while you are taking this medicine. Talk to your health care provider about your risk of cancer. You may be more at risk for certain types of cancer if you take this medicine. What side effects may I notice from receiving this medication? Side effects that you should report to your doctor or health care professional as soon as possible: allergic reactions like skin rash, itching or hives, swelling of the face, lips, or tongue back pain dizziness or feeling faint fever pain, redness, or irritation at site where injected pinpoint red spots on the skin shortness of breath or breathing problems signs and symptoms of kidney injury like trouble passing urine, change in the amount of urine, or red or dark-brown urine stomach or side pain, or pain at the shoulder swelling tiredness unusual bleeding or bruising Side effects that usually do not require medical attention (report to your doctor or health care professional if they continue or are bothersome): bone pain cough diarrhea hair loss headache muscle pain This list may not describe all possible side effects. Call your doctor for medical advice about side effects. You may report side effects to FDA at 1-800-FDA-1088. Where should I keep my medication? Keep out of the reach of children. Store in a refrigerator between 2 and 8 degrees C (36 and 46 degrees F). Do not freeze. Keep in carton to protect from light. Throw away this medicine if vials or syringes are left out of the refrigerator for more than 24 hours. Throw away any unused medicine after the expiration date. NOTE: This sheet is a summary. It may not cover all possible information. If you have questions about this medicine, talk to your doctor, pharmacist, or health care provider.  2022 Elsevier/Gold Standard (2019-05-25  18:47:55)  

## 2020-12-07 DIAGNOSIS — D469 Myelodysplastic syndrome, unspecified: Secondary | ICD-10-CM | POA: Insufficient documentation

## 2020-12-11 DIAGNOSIS — E1122 Type 2 diabetes mellitus with diabetic chronic kidney disease: Secondary | ICD-10-CM | POA: Diagnosis not present

## 2020-12-11 DIAGNOSIS — I130 Hypertensive heart and chronic kidney disease with heart failure and stage 1 through stage 4 chronic kidney disease, or unspecified chronic kidney disease: Secondary | ICD-10-CM | POA: Diagnosis not present

## 2020-12-11 DIAGNOSIS — E039 Hypothyroidism, unspecified: Secondary | ICD-10-CM | POA: Diagnosis not present

## 2020-12-11 DIAGNOSIS — J189 Pneumonia, unspecified organism: Secondary | ICD-10-CM | POA: Diagnosis not present

## 2020-12-11 DIAGNOSIS — N184 Chronic kidney disease, stage 4 (severe): Secondary | ICD-10-CM | POA: Diagnosis not present

## 2020-12-11 DIAGNOSIS — I509 Heart failure, unspecified: Secondary | ICD-10-CM | POA: Diagnosis not present

## 2020-12-11 DIAGNOSIS — E7800 Pure hypercholesterolemia, unspecified: Secondary | ICD-10-CM | POA: Diagnosis not present

## 2020-12-11 DIAGNOSIS — D631 Anemia in chronic kidney disease: Secondary | ICD-10-CM | POA: Diagnosis not present

## 2020-12-11 DIAGNOSIS — D509 Iron deficiency anemia, unspecified: Secondary | ICD-10-CM | POA: Diagnosis not present

## 2020-12-13 ENCOUNTER — Inpatient Hospital Stay: Payer: Medicare PPO

## 2020-12-13 ENCOUNTER — Other Ambulatory Visit: Payer: Self-pay

## 2020-12-13 VITALS — BP 135/56 | HR 68 | Temp 97.5°F | Resp 18 | Ht 64.0 in | Wt 138.1 lb

## 2020-12-13 DIAGNOSIS — N189 Chronic kidney disease, unspecified: Secondary | ICD-10-CM | POA: Diagnosis not present

## 2020-12-13 DIAGNOSIS — D631 Anemia in chronic kidney disease: Secondary | ICD-10-CM | POA: Diagnosis not present

## 2020-12-13 DIAGNOSIS — Z79899 Other long term (current) drug therapy: Secondary | ICD-10-CM | POA: Diagnosis not present

## 2020-12-13 DIAGNOSIS — D469 Myelodysplastic syndrome, unspecified: Secondary | ICD-10-CM | POA: Diagnosis not present

## 2020-12-13 MED ORDER — FILGRASTIM-SNDZ 480 MCG/0.8ML IJ SOSY
PREFILLED_SYRINGE | INTRAMUSCULAR | Status: AC
  Filled 2020-12-13: qty 0.8

## 2020-12-13 MED ORDER — FILGRASTIM-SNDZ 480 MCG/0.8ML IJ SOSY
480.0000 ug | PREFILLED_SYRINGE | Freq: Once | INTRAMUSCULAR | Status: AC
  Administered 2020-12-13: 480 ug via SUBCUTANEOUS

## 2020-12-13 MED ORDER — EPOETIN ALFA-EPBX 40000 UNIT/ML IJ SOLN
40000.0000 [IU] | Freq: Once | INTRAMUSCULAR | Status: AC
  Administered 2020-12-13: 40000 [IU] via SUBCUTANEOUS

## 2020-12-13 MED ORDER — EPOETIN ALFA-EPBX 40000 UNIT/ML IJ SOLN
INTRAMUSCULAR | Status: AC
  Filled 2020-12-13: qty 1

## 2020-12-13 NOTE — Patient Instructions (Signed)
Filgrastim, G-CSF injection What is this medication? FILGRASTIM, G-CSF (fil GRA stim) is a granulocyte colony-stimulating factor that stimulates the growth of neutrophils, a type of white blood cell (WBC) important in the body's fight against infection. It is used to reduce the incidence of fever and infection in patients with certain types of cancer who are receiving chemotherapy that affects the bone marrow, to stimulate blood cell production for removal of WBCs from the body prior to a bone marrow transplantation, to reduce the incidence of fever and infection in patients who have severe chronic neutropenia, and to improve survival outcomes followinghigh-dose radiation exposure that is toxic to the bone marrow. This medicine may be used for other purposes; ask your health care provider orpharmacist if you have questions. COMMON BRAND NAME(S): Neupogen, Nivestym, Releuko, Zarxio What should I tell my care team before I take this medication? They need to know if you have any of these conditions: kidney disease latex allergy ongoing radiation therapy sickle cell disease an unusual or allergic reaction to filgrastim, pegfilgrastim, other medicines, foods, dyes, or preservatives pregnant or trying to get pregnant breast-feeding How should I use this medication? This medicine is for injection under the skin or infusion into a vein. As an infusion into a vein, it is usually given by a health care professional in a hospital or clinic setting. If you get this medicine at home, you will be taught how to prepare and give this medicine. Refer to the Instructions for Use that come with your medication packaging. Use exactly as directed. Take your medicine at regular intervals. Do not take your medicine more often thandirected. It is important that you put your used needles and syringes in a special sharps container. Do not put them in a trash can. If you do not have a sharpscontainer, call your pharmacist or  healthcare provider to get one. Talk to your pediatrician regarding the use of this medicine in children. While this drug may be prescribed for children as young as 7 months for selectedconditions, precautions do apply. Overdosage: If you think you have taken too much of this medicine contact apoison control center or emergency room at once. NOTE: This medicine is only for you. Do not share this medicine with others. What if I miss a dose? It is important not to miss your dose. Call your doctor or health careprofessional if you miss a dose. What may interact with this medication? This medicine may interact with the following medications: medicines that may cause a release of neutrophils, such as lithium This list may not describe all possible interactions. Give your health care provider a list of all the medicines, herbs, non-prescription drugs, or dietary supplements you use. Also tell them if you smoke, drink alcohol, or use illegaldrugs. Some items may interact with your medicine. What should I watch for while using this medication? Your condition will be monitored carefully while you are receiving thismedicine. You may need blood work done while you are taking this medicine. Talk to your health care provider about your risk of cancer. You may be more atrisk for certain types of cancer if you take this medicine. What side effects may I notice from receiving this medication? Side effects that you should report to your doctor or health care professionalas soon as possible: allergic reactions like skin rash, itching or hives, swelling of the face, lips, or tongue back pain dizziness or feeling faint fever pain, redness, or irritation at site where injected pinpoint red spots on the skin  shortness of breath or breathing problems signs and symptoms of kidney injury like trouble passing urine, change in the amount of urine, or red or dark-brown urine stomach or side pain, or pain at the  shoulder swelling tiredness unusual bleeding or bruising Side effects that usually do not require medical attention (report to yourdoctor or health care professional if they continue or are bothersome): bone pain cough diarrhea hair loss headache muscle pain This list may not describe all possible side effects. Call your doctor for medical advice about side effects. You may report side effects to FDA at1-800-FDA-1088. Where should I keep my medication? Keep out of the reach of children. Store in a refrigerator between 2 and 8 degrees C (36 and 46 degrees F). Do not freeze. Keep in carton to protect from light. Throw away this medicine if vials or syringes are left out of the refrigerator for more than 24 hours. Throw awayany unused medicine after the expiration date. NOTE: This sheet is a summary. It may not cover all possible information. If you have questions about this medicine, talk to your doctor, pharmacist, orhealth care provider.  2022 Elsevier/Gold Standard (2019-05-25 18:47:55) Epoetin Alfa injection What is this medication? EPOETIN ALFA (e POE e tin AL fa) helps your body make more red blood cells. This medicine is used to treat anemia caused by chronic kidney disease, cancer chemotherapy, or HIV-therapy. It may also be used before surgery if you haveanemia. This medicine may be used for other purposes; ask your health care provider orpharmacist if you have questions. COMMON BRAND NAME(S): Epogen, Procrit, Retacrit What should I tell my care team before I take this medication? They need to know if you have any of these conditions: cancer heart disease high blood pressure history of blood clots history of stroke low levels of folate, iron, or vitamin B12 in the blood seizures an unusual or allergic reaction to erythropoietin, albumin, benzyl alcohol, hamster proteins, other medicines, foods, dyes, or preservatives pregnant or trying to get pregnant breast-feeding How  should I use this medication? This medicine is for injection into a vein or under the skin. It is [place] by a health care professional in a hospital or clinic setting. If you get this medicine at home, you will be taught how to prepare and give this medicine. Use exactly as directed. Take your medicine at regularintervals. Do not take your medicine more often than directed. It is important that you put your used needles and syringes in a special sharps container. Do not put them in a trash can. If you do not have a sharpscontainer, call your pharmacist or healthcare provider to get one. A special MedGuide will be given to you by the pharmacist with eachprescription and refill. Be sure to read this information carefully each time. Talk to your pediatrician regarding the use of this medicine in children. Whilethis drug may be prescribed for selected conditions, precautions do apply. Overdosage: If you think you have taken too much of this medicine contact apoison control center or emergency room at once. NOTE: This medicine is only for you. Do not share this medicine with others. What if I miss a dose? If you miss a dose, take it as soon as you can. If it is almost time for yournext dose, take only that dose. Do not take double or extra doses. What may interact with this medication? Interactions have not been studied. This list may not describe all possible interactions. Give your health care provider a list of  all the medicines, herbs, non-prescription drugs, or dietary supplements you use. Also tell them if you smoke, drink alcohol, or use illegaldrugs. Some items may interact with your medicine. What should I watch for while using this medication? Your condition will be monitored carefully while you are receiving thismedicine. You may need blood work done while you are taking this medicine. This medicine may cause a decrease in vitamin B6. You should make sure that you get enough vitamin B6  while you are taking this medicine. Discuss the foods youeat and the vitamins you take with your health care professional. What side effects may I notice from receiving this medication? Side effects that you should report to your doctor or health care professionalas soon as possible: allergic reactions like skin rash, itching or hives, swelling of the face, lips, or tongue seizures signs and symptoms of a blood clot such as breathing problems; changes in vision; chest pain; severe, sudden headache; pain, swelling, warmth in the leg; trouble speaking; sudden numbness or weakness of the face, arm or leg signs and symptoms of a stroke like changes in vision; confusion; trouble speaking or understanding; severe headaches; sudden numbness or weakness of the face, arm or leg; trouble walking; dizziness; loss of balance or coordination Side effects that usually do not require medical attention (report to yourdoctor or health care professional if they continue or are bothersome): chills cough dizziness fever headaches joint pain muscle cramps muscle pain nausea, vomiting pain, redness, or irritation at site where injected This list may not describe all possible side effects. Call your doctor for medical advice about side effects. You may report side effects to FDA at1-800-FDA-1088. Where should I keep my medication? Keep out of the reach of children. Store in a refrigerator between 2 and 8 degrees C (36 and 46 degrees F). Do not freeze or shake. Throw away any unused portion if using a single-dose vial. Multi-dose vials can be kept in the refrigerator for up to 21 days after theinitial dose. Throw away unused medicine. NOTE: This sheet is a summary. It may not cover all possible information. If you have questions about this medicine, talk to your doctor, pharmacist, orhealth care provider.  2022 Elsevier/Gold Standard (2016-12-11 08:35:19)

## 2020-12-17 DIAGNOSIS — E039 Hypothyroidism, unspecified: Secondary | ICD-10-CM | POA: Diagnosis not present

## 2020-12-17 DIAGNOSIS — J189 Pneumonia, unspecified organism: Secondary | ICD-10-CM | POA: Diagnosis not present

## 2020-12-17 DIAGNOSIS — D509 Iron deficiency anemia, unspecified: Secondary | ICD-10-CM | POA: Diagnosis not present

## 2020-12-17 DIAGNOSIS — N184 Chronic kidney disease, stage 4 (severe): Secondary | ICD-10-CM | POA: Diagnosis not present

## 2020-12-17 DIAGNOSIS — I509 Heart failure, unspecified: Secondary | ICD-10-CM | POA: Diagnosis not present

## 2020-12-17 DIAGNOSIS — I130 Hypertensive heart and chronic kidney disease with heart failure and stage 1 through stage 4 chronic kidney disease, or unspecified chronic kidney disease: Secondary | ICD-10-CM | POA: Diagnosis not present

## 2020-12-17 DIAGNOSIS — E1122 Type 2 diabetes mellitus with diabetic chronic kidney disease: Secondary | ICD-10-CM | POA: Diagnosis not present

## 2020-12-17 DIAGNOSIS — D631 Anemia in chronic kidney disease: Secondary | ICD-10-CM | POA: Diagnosis not present

## 2020-12-17 DIAGNOSIS — E7800 Pure hypercholesterolemia, unspecified: Secondary | ICD-10-CM | POA: Diagnosis not present

## 2020-12-20 ENCOUNTER — Other Ambulatory Visit: Payer: Self-pay | Admitting: Hematology and Oncology

## 2020-12-20 ENCOUNTER — Inpatient Hospital Stay: Payer: Medicare PPO | Attending: Oncology

## 2020-12-20 ENCOUNTER — Inpatient Hospital Stay: Payer: Medicare PPO

## 2020-12-20 ENCOUNTER — Other Ambulatory Visit: Payer: Self-pay

## 2020-12-20 VITALS — BP 148/68 | HR 72 | Temp 98.0°F | Resp 18 | Ht 64.0 in | Wt 136.5 lb

## 2020-12-20 DIAGNOSIS — Z79899 Other long term (current) drug therapy: Secondary | ICD-10-CM | POA: Diagnosis not present

## 2020-12-20 DIAGNOSIS — D631 Anemia in chronic kidney disease: Secondary | ICD-10-CM | POA: Diagnosis not present

## 2020-12-20 DIAGNOSIS — N189 Chronic kidney disease, unspecified: Secondary | ICD-10-CM | POA: Insufficient documentation

## 2020-12-20 DIAGNOSIS — D460 Refractory anemia without ring sideroblasts, so stated: Secondary | ICD-10-CM | POA: Diagnosis not present

## 2020-12-20 DIAGNOSIS — D649 Anemia, unspecified: Secondary | ICD-10-CM | POA: Diagnosis not present

## 2020-12-20 DIAGNOSIS — D4620 Refractory anemia with excess of blasts, unspecified: Secondary | ICD-10-CM

## 2020-12-20 DIAGNOSIS — N184 Chronic kidney disease, stage 4 (severe): Secondary | ICD-10-CM

## 2020-12-20 LAB — CBC AND DIFFERENTIAL
HCT: 24 — AB (ref 36–46)
Hemoglobin: 7.3 — AB (ref 12.0–16.0)
Neutrophils Absolute: 2.74
Platelets: 281 (ref 150–399)
WBC: 4.8

## 2020-12-20 LAB — CBC
MCV: 89 (ref 81–99)
RBC: 2.75 — AB (ref 3.87–5.11)

## 2020-12-20 MED ORDER — EPOETIN ALFA-EPBX 40000 UNIT/ML IJ SOLN
INTRAMUSCULAR | Status: AC
  Filled 2020-12-20: qty 1

## 2020-12-20 MED ORDER — EPOETIN ALFA-EPBX 40000 UNIT/ML IJ SOLN
40000.0000 [IU] | Freq: Once | INTRAMUSCULAR | Status: AC
  Administered 2020-12-20: 40000 [IU] via SUBCUTANEOUS

## 2020-12-20 MED ORDER — FILGRASTIM-SNDZ 480 MCG/0.8ML IJ SOSY
PREFILLED_SYRINGE | INTRAMUSCULAR | Status: AC
  Filled 2020-12-20: qty 0.8

## 2020-12-20 MED ORDER — FILGRASTIM-SNDZ 480 MCG/0.8ML IJ SOSY
480.0000 ug | PREFILLED_SYRINGE | Freq: Once | INTRAMUSCULAR | Status: AC
  Administered 2020-12-20: 480 ug via SUBCUTANEOUS

## 2020-12-20 NOTE — Patient Instructions (Signed)
Filgrastim, G-CSF injection What is this medication? FILGRASTIM, G-CSF (fil GRA stim) is a granulocyte colony-stimulating factor that stimulates the growth of neutrophils, a type of white blood cell (WBC) important in the body's fight against infection. It is used to reduce the incidence of fever and infection in patients with certain types of cancer who are receiving chemotherapy that affects the bone marrow, to stimulate blood cell production for removal of WBCs from the body prior to a bone marrow transplantation, to reduce the incidence of fever and infection in patients who have severe chronic neutropenia, and to improve survival outcomes followinghigh-dose radiation exposure that is toxic to the bone marrow. This medicine may be used for other purposes; ask your health care provider orpharmacist if you have questions. COMMON BRAND NAME(S): Neupogen, Nivestym, Releuko, Zarxio What should I tell my care team before I take this medication? They need to know if you have any of these conditions: kidney disease latex allergy ongoing radiation therapy sickle cell disease an unusual or allergic reaction to filgrastim, pegfilgrastim, other medicines, foods, dyes, or preservatives pregnant or trying to get pregnant breast-feeding How should I use this medication? This medicine is for injection under the skin or infusion into a vein. As an infusion into a vein, it is usually given by a health care professional in a hospital or clinic setting. If you get this medicine at home, you will be taught how to prepare and give this medicine. Refer to the Instructions for Use that come with your medication packaging. Use exactly as directed. Take your medicine at regular intervals. Do not take your medicine more often thandirected. It is important that you put your used needles and syringes in a special sharps container. Do not put them in a trash can. If you do not have a sharpscontainer, call your pharmacist or  healthcare provider to get one. Talk to your pediatrician regarding the use of this medicine in children. While this drug may be prescribed for children as young as 7 months for selectedconditions, precautions do apply. Overdosage: If you think you have taken too much of this medicine contact apoison control center or emergency room at once. NOTE: This medicine is only for you. Do not share this medicine with others. What if I miss a dose? It is important not to miss your dose. Call your doctor or health careprofessional if you miss a dose. What may interact with this medication? This medicine may interact with the following medications: medicines that may cause a release of neutrophils, such as lithium This list may not describe all possible interactions. Give your health care provider a list of all the medicines, herbs, non-prescription drugs, or dietary supplements you use. Also tell them if you smoke, drink alcohol, or use illegaldrugs. Some items may interact with your medicine. What should I watch for while using this medication? Your condition will be monitored carefully while you are receiving thismedicine. You may need blood work done while you are taking this medicine. Talk to your health care provider about your risk of cancer. You may be more atrisk for certain types of cancer if you take this medicine. What side effects may I notice from receiving this medication? Side effects that you should report to your doctor or health care professionalas soon as possible: allergic reactions like skin rash, itching or hives, swelling of the face, lips, or tongue back pain dizziness or feeling faint fever pain, redness, or irritation at site where injected pinpoint red spots on the skin  shortness of breath or breathing problems signs and symptoms of kidney injury like trouble passing urine, change in the amount of urine, or red or dark-brown urine stomach or side pain, or pain at the  shoulder swelling tiredness unusual bleeding or bruising Side effects that usually do not require medical attention (report to yourdoctor or health care professional if they continue or are bothersome): bone pain cough diarrhea hair loss headache muscle pain This list may not describe all possible side effects. Call your doctor for medical advice about side effects. You may report side effects to FDA at1-800-FDA-1088. Where should I keep my medication? Keep out of the reach of children. Store in a refrigerator between 2 and 8 degrees C (36 and 46 degrees F). Do not freeze. Keep in carton to protect from light. Throw away this medicine if vials or syringes are left out of the refrigerator for more than 24 hours. Throw awayany unused medicine after the expiration date. NOTE: This sheet is a summary. It may not cover all possible information. If you have questions about this medicine, talk to your doctor, pharmacist, orhealth care provider.  2022 Elsevier/Gold Standard (2019-05-25 18:47:55) Epoetin Alfa injection What is this medication? EPOETIN ALFA (e POE e tin AL fa) helps your body make more red blood cells. This medicine is used to treat anemia caused by chronic kidney disease, cancer chemotherapy, or HIV-therapy. It may also be used before surgery if you haveanemia. This medicine may be used for other purposes; ask your health care provider orpharmacist if you have questions. COMMON BRAND NAME(S): Epogen, Procrit, Retacrit What should I tell my care team before I take this medication? They need to know if you have any of these conditions: cancer heart disease high blood pressure history of blood clots history of stroke low levels of folate, iron, or vitamin B12 in the blood seizures an unusual or allergic reaction to erythropoietin, albumin, benzyl alcohol, hamster proteins, other medicines, foods, dyes, or preservatives pregnant or trying to get pregnant breast-feeding How  should I use this medication? This medicine is for injection into a vein or under the skin. It is [place] by a health care professional in a hospital or clinic setting. If you get this medicine at home, you will be taught how to prepare and give this medicine. Use exactly as directed. Take your medicine at regularintervals. Do not take your medicine more often than directed. It is important that you put your used needles and syringes in a special sharps container. Do not put them in a trash can. If you do not have a sharpscontainer, call your pharmacist or healthcare provider to get one. A special MedGuide will be given to you by the pharmacist with eachprescription and refill. Be sure to read this information carefully each time. Talk to your pediatrician regarding the use of this medicine in children. Whilethis drug may be prescribed for selected conditions, precautions do apply. Overdosage: If you think you have taken too much of this medicine contact apoison control center or emergency room at once. NOTE: This medicine is only for you. Do not share this medicine with others. What if I miss a dose? If you miss a dose, take it as soon as you can. If it is almost time for yournext dose, take only that dose. Do not take double or extra doses. What may interact with this medication? Interactions have not been studied. This list may not describe all possible interactions. Give your health care provider a list of  all the medicines, herbs, non-prescription drugs, or dietary supplements you use. Also tell them if you smoke, drink alcohol, or use illegaldrugs. Some items may interact with your medicine. What should I watch for while using this medication? Your condition will be monitored carefully while you are receiving thismedicine. You may need blood work done while you are taking this medicine. This medicine may cause a decrease in vitamin B6. You should make sure that you get enough vitamin B6  while you are taking this medicine. Discuss the foods youeat and the vitamins you take with your health care professional. What side effects may I notice from receiving this medication? Side effects that you should report to your doctor or health care professionalas soon as possible: allergic reactions like skin rash, itching or hives, swelling of the face, lips, or tongue seizures signs and symptoms of a blood clot such as breathing problems; changes in vision; chest pain; severe, sudden headache; pain, swelling, warmth in the leg; trouble speaking; sudden numbness or weakness of the face, arm or leg signs and symptoms of a stroke like changes in vision; confusion; trouble speaking or understanding; severe headaches; sudden numbness or weakness of the face, arm or leg; trouble walking; dizziness; loss of balance or coordination Side effects that usually do not require medical attention (report to yourdoctor or health care professional if they continue or are bothersome): chills cough dizziness fever headaches joint pain muscle cramps muscle pain nausea, vomiting pain, redness, or irritation at site where injected This list may not describe all possible side effects. Call your doctor for medical advice about side effects. You may report side effects to FDA at1-800-FDA-1088. Where should I keep my medication? Keep out of the reach of children. Store in a refrigerator between 2 and 8 degrees C (36 and 46 degrees F). Do not freeze or shake. Throw away any unused portion if using a single-dose vial. Multi-dose vials can be kept in the refrigerator for up to 21 days after theinitial dose. Throw away unused medicine. NOTE: This sheet is a summary. It may not cover all possible information. If you have questions about this medicine, talk to your doctor, pharmacist, orhealth care provider.  2022 Elsevier/Gold Standard (2016-12-11 08:35:19)

## 2020-12-27 ENCOUNTER — Other Ambulatory Visit: Payer: Self-pay

## 2020-12-27 ENCOUNTER — Inpatient Hospital Stay: Payer: Medicare PPO

## 2020-12-27 ENCOUNTER — Ambulatory Visit: Payer: Medicare PPO | Admitting: Sports Medicine

## 2020-12-27 VITALS — BP 149/68 | HR 71 | Temp 97.6°F | Resp 18 | Ht 64.0 in | Wt 139.5 lb

## 2020-12-27 DIAGNOSIS — Z79899 Other long term (current) drug therapy: Secondary | ICD-10-CM | POA: Diagnosis not present

## 2020-12-27 DIAGNOSIS — I509 Heart failure, unspecified: Secondary | ICD-10-CM | POA: Diagnosis not present

## 2020-12-27 DIAGNOSIS — N189 Chronic kidney disease, unspecified: Secondary | ICD-10-CM | POA: Diagnosis not present

## 2020-12-27 DIAGNOSIS — D509 Iron deficiency anemia, unspecified: Secondary | ICD-10-CM | POA: Diagnosis not present

## 2020-12-27 DIAGNOSIS — E039 Hypothyroidism, unspecified: Secondary | ICD-10-CM | POA: Diagnosis not present

## 2020-12-27 DIAGNOSIS — E1122 Type 2 diabetes mellitus with diabetic chronic kidney disease: Secondary | ICD-10-CM | POA: Diagnosis not present

## 2020-12-27 DIAGNOSIS — N184 Chronic kidney disease, stage 4 (severe): Secondary | ICD-10-CM | POA: Diagnosis not present

## 2020-12-27 DIAGNOSIS — E7800 Pure hypercholesterolemia, unspecified: Secondary | ICD-10-CM | POA: Diagnosis not present

## 2020-12-27 DIAGNOSIS — D460 Refractory anemia without ring sideroblasts, so stated: Secondary | ICD-10-CM | POA: Diagnosis not present

## 2020-12-27 DIAGNOSIS — J189 Pneumonia, unspecified organism: Secondary | ICD-10-CM | POA: Diagnosis not present

## 2020-12-27 DIAGNOSIS — D631 Anemia in chronic kidney disease: Secondary | ICD-10-CM

## 2020-12-27 DIAGNOSIS — I130 Hypertensive heart and chronic kidney disease with heart failure and stage 1 through stage 4 chronic kidney disease, or unspecified chronic kidney disease: Secondary | ICD-10-CM | POA: Diagnosis not present

## 2020-12-27 MED ORDER — FILGRASTIM-SNDZ 480 MCG/0.8ML IJ SOSY
480.0000 ug | PREFILLED_SYRINGE | Freq: Once | INTRAMUSCULAR | Status: AC
  Administered 2020-12-27: 480 ug via SUBCUTANEOUS
  Filled 2020-12-27: qty 0.8

## 2020-12-27 MED ORDER — EPOETIN ALFA-EPBX 40000 UNIT/ML IJ SOLN
40000.0000 [IU] | Freq: Once | INTRAMUSCULAR | Status: AC
  Administered 2020-12-27: 40000 [IU] via SUBCUTANEOUS
  Filled 2020-12-27: qty 1

## 2020-12-27 NOTE — Patient Instructions (Signed)
Epoetin Alfa injection What is this medication? EPOETIN ALFA (e POE e tin AL fa) helps your body make more red blood cells. This medicine is used to treat anemia caused by chronic kidney disease, cancer chemotherapy, or HIV-therapy. It may also be used before surgery if you have anemia. This medicine may be used for other purposes; ask your health care provider or pharmacist if you have questions. COMMON BRAND NAME(S): Epogen, Procrit, Retacrit What should I tell my care team before I take this medication? They need to know if you have any of these conditions: cancer heart disease high blood pressure history of blood clots history of stroke low levels of folate, iron, or vitamin B12 in the blood seizures an unusual or allergic reaction to erythropoietin, albumin, benzyl alcohol, hamster proteins, other medicines, foods, dyes, or preservatives pregnant or trying to get pregnant breast-feeding How should I use this medication? This medicine is for injection into a vein or under the skin. It is usually given by a health care professional in a hospital or clinic setting. If you get this medicine at home, you will be taught how to prepare and give this medicine. Use exactly as directed. Take your medicine at regular intervals. Do not take your medicine more often than directed. It is important that you put your used needles and syringes in a special sharps container. Do not put them in a trash can. If you do not have a sharps container, call your pharmacist or healthcare provider to get one. A special MedGuide will be given to you by the pharmacist with each prescription and refill. Be sure to read this information carefully each time. Talk to your pediatrician regarding the use of this medicine in children. While this drug may be prescribed for selected conditions, precautions do apply. Overdosage: If you think you have taken too much of this medicine contact a poison control center or emergency  room at once. NOTE: This medicine is only for you. Do not share this medicine with others. What if I miss a dose? If you miss a dose, take it as soon as you can. If it is almost time for your next dose, take only that dose. Do not take double or extra doses. What may interact with this medication? Interactions have not been studied. This list may not describe all possible interactions. Give your health care provider a list of all the medicines, herbs, non-prescription drugs, or dietary supplements you use. Also tell them if you smoke, drink alcohol, or use illegal drugs. Some items may interact with your medicine. What should I watch for while using this medication? Your condition will be monitored carefully while you are receiving this medicine. You may need blood work done while you are taking this medicine. This medicine may cause a decrease in vitamin B6. You should make sure that you get enough vitamin B6 while you are taking this medicine. Discuss the foods you eat and the vitamins you take with your health care professional. What side effects may I notice from receiving this medication? Side effects that you should report to your doctor or health care professional as soon as possible: allergic reactions like skin rash, itching or hives, swelling of the face, lips, or tongue seizures signs and symptoms of a blood clot such as breathing problems; changes in vision; chest pain; severe, sudden headache; pain, swelling, warmth in the leg; trouble speaking; sudden numbness or weakness of the face, arm or leg signs and symptoms of a stroke like   changes in vision; confusion; trouble speaking or understanding; severe headaches; sudden numbness or weakness of the face, arm or leg; trouble walking; dizziness; loss of balance or coordination Side effects that usually do not require medical attention (report to your doctor or health care professional if they continue or are  bothersome): chills cough dizziness fever headaches joint pain muscle cramps muscle pain nausea, vomiting pain, redness, or irritation at site where injected This list may not describe all possible side effects. Call your doctor for medical advice about side effects. You may report side effects to FDA at 1-800-FDA-1088. Where should I keep my medication? Keep out of the reach of children. Store in a refrigerator between 2 and 8 degrees C (36 and 46 degrees F). Do not freeze or shake. Throw away any unused portion if using a single-dose vial. Multi-dose vials can be kept in the refrigerator for up to 21 days after the initial dose. Throw away unused medicine. NOTE: This sheet is a summary. It may not cover all possible information. If you have questions about this medicine, talk to your doctor, pharmacist, or health care provider.  2022 Elsevier/Gold Standard (2016-12-11 08:35:19)  

## 2020-12-27 NOTE — Progress Notes (Signed)
1439:PT STABLE AT TIME OF DISCHARGE °

## 2020-12-31 LAB — SURGICAL PATHOLOGY

## 2021-01-01 DIAGNOSIS — D638 Anemia in other chronic diseases classified elsewhere: Secondary | ICD-10-CM | POA: Diagnosis not present

## 2021-01-01 DIAGNOSIS — N184 Chronic kidney disease, stage 4 (severe): Secondary | ICD-10-CM | POA: Diagnosis not present

## 2021-01-01 DIAGNOSIS — E1142 Type 2 diabetes mellitus with diabetic polyneuropathy: Secondary | ICD-10-CM | POA: Diagnosis not present

## 2021-01-01 DIAGNOSIS — I110 Hypertensive heart disease with heart failure: Secondary | ICD-10-CM | POA: Diagnosis not present

## 2021-01-01 DIAGNOSIS — E785 Hyperlipidemia, unspecified: Secondary | ICD-10-CM | POA: Diagnosis not present

## 2021-01-01 DIAGNOSIS — E039 Hypothyroidism, unspecified: Secondary | ICD-10-CM | POA: Diagnosis not present

## 2021-01-01 DIAGNOSIS — I482 Chronic atrial fibrillation, unspecified: Secondary | ICD-10-CM | POA: Diagnosis not present

## 2021-01-01 DIAGNOSIS — I5040 Unspecified combined systolic (congestive) and diastolic (congestive) heart failure: Secondary | ICD-10-CM | POA: Diagnosis not present

## 2021-01-02 ENCOUNTER — Other Ambulatory Visit: Payer: Self-pay | Admitting: Hematology and Oncology

## 2021-01-02 ENCOUNTER — Inpatient Hospital Stay: Payer: Medicare PPO

## 2021-01-02 ENCOUNTER — Other Ambulatory Visit: Payer: Self-pay

## 2021-01-02 DIAGNOSIS — D631 Anemia in chronic kidney disease: Secondary | ICD-10-CM

## 2021-01-02 DIAGNOSIS — N189 Chronic kidney disease, unspecified: Secondary | ICD-10-CM | POA: Diagnosis not present

## 2021-01-02 DIAGNOSIS — D460 Refractory anemia without ring sideroblasts, so stated: Secondary | ICD-10-CM | POA: Diagnosis not present

## 2021-01-02 DIAGNOSIS — N184 Chronic kidney disease, stage 4 (severe): Secondary | ICD-10-CM

## 2021-01-02 DIAGNOSIS — Z79899 Other long term (current) drug therapy: Secondary | ICD-10-CM | POA: Diagnosis not present

## 2021-01-02 LAB — PREPARE RBC (CROSSMATCH)

## 2021-01-02 NOTE — Addendum Note (Signed)
Addended by: Juanetta Beets on: 01/02/2021 02:45 PM   Modules accepted: Orders

## 2021-01-03 ENCOUNTER — Inpatient Hospital Stay: Payer: Medicare PPO

## 2021-01-03 ENCOUNTER — Other Ambulatory Visit: Payer: Self-pay

## 2021-01-03 VITALS — BP 161/74 | HR 67 | Temp 97.7°F | Resp 18 | Ht 64.0 in | Wt 137.0 lb

## 2021-01-03 DIAGNOSIS — Z79899 Other long term (current) drug therapy: Secondary | ICD-10-CM | POA: Diagnosis not present

## 2021-01-03 DIAGNOSIS — D460 Refractory anemia without ring sideroblasts, so stated: Secondary | ICD-10-CM | POA: Diagnosis not present

## 2021-01-03 DIAGNOSIS — D631 Anemia in chronic kidney disease: Secondary | ICD-10-CM | POA: Diagnosis not present

## 2021-01-03 DIAGNOSIS — N189 Chronic kidney disease, unspecified: Secondary | ICD-10-CM | POA: Diagnosis not present

## 2021-01-03 DIAGNOSIS — N184 Chronic kidney disease, stage 4 (severe): Secondary | ICD-10-CM

## 2021-01-03 MED ORDER — ACETAMINOPHEN 325 MG PO TABS
650.0000 mg | ORAL_TABLET | Freq: Once | ORAL | Status: AC
  Administered 2021-01-03: 650 mg via ORAL
  Filled 2021-01-03: qty 2

## 2021-01-03 MED ORDER — FILGRASTIM-SNDZ 480 MCG/0.8ML IJ SOSY
480.0000 ug | PREFILLED_SYRINGE | Freq: Once | INTRAMUSCULAR | Status: AC
  Administered 2021-01-03: 480 ug via SUBCUTANEOUS

## 2021-01-03 MED ORDER — FILGRASTIM-SNDZ 480 MCG/0.8ML IJ SOSY
PREFILLED_SYRINGE | INTRAMUSCULAR | Status: AC
  Filled 2021-01-03: qty 0.8

## 2021-01-03 MED ORDER — SODIUM CHLORIDE 0.9% IV SOLUTION
250.0000 mL | Freq: Once | INTRAVENOUS | Status: AC
  Administered 2021-01-03: 250 mL via INTRAVENOUS

## 2021-01-03 MED ORDER — DIPHENHYDRAMINE HCL 25 MG PO CAPS
25.0000 mg | ORAL_CAPSULE | Freq: Once | ORAL | Status: AC
  Administered 2021-01-03: 25 mg via ORAL
  Filled 2021-01-03: qty 1

## 2021-01-03 NOTE — Patient Instructions (Signed)
Filgrastim, G-CSF injection What is this medication? FILGRASTIM, G-CSF (fil GRA stim) is a granulocyte colony-stimulating factor that stimulates the growth of neutrophils, a type of white blood cell (WBC) important in the body's fight against infection. It is used to reduce the incidence of fever and infection in patients with certain types of cancer who are receiving chemotherapy that affects the bone marrow, to stimulate blood cell production for removal of WBCs from the body prior to a bone marrow transplantation, to reduce the incidence of fever and infection in patients who have severe chronic neutropenia, and to improve survival outcomes following high-dose radiation exposure that is toxic to the bone marrow. This medicine may be used for other purposes; ask your health care provider or pharmacist if you have questions. COMMON BRAND NAME(S): Neupogen, Nivestym, Releuko, Zarxio What should I tell my care team before I take this medication? They need to know if you have any of these conditions: kidney disease latex allergy ongoing radiation therapy sickle cell disease an unusual or allergic reaction to filgrastim, pegfilgrastim, other medicines, foods, dyes, or preservatives pregnant or trying to get pregnant breast-feeding How should I use this medication? This medicine is for injection under the skin or infusion into a vein. As an infusion into a vein, it is usually given by a health care professional in a hospital or clinic setting. If you get this medicine at home, you will be taught how to prepare and give this medicine. Refer to the Instructions for Use that come with your medication packaging. Use exactly as directed. Take your medicine at regular intervals. Do not take your medicine more often than directed. It is important that you put your used needles and syringes in a special sharps container. Do not put them in a trash can. If you do not have a sharps container, call your pharmacist  or healthcare provider to get one. Talk to your pediatrician regarding the use of this medicine in children. While this drug may be prescribed for children as young as 7 months for selected conditions, precautions do apply. Overdosage: If you think you have taken too much of this medicine contact a poison control center or emergency room at once. NOTE: This medicine is only for you. Do not share this medicine with others. What if I miss a dose? It is important not to miss your dose. Call your doctor or health care professional if you miss a dose. What may interact with this medication? This medicine may interact with the following medications: medicines that may cause a release of neutrophils, such as lithium This list may not describe all possible interactions. Give your health care provider a list of all the medicines, herbs, non-prescription drugs, or dietary supplements you use. Also tell them if you smoke, drink alcohol, or use illegal drugs. Some items may interact with your medicine. What should I watch for while using this medication? Your condition will be monitored carefully while you are receiving this medicine. You may need blood work done while you are taking this medicine. Talk to your health care provider about your risk of cancer. You may be more at risk for certain types of cancer if you take this medicine. What side effects may I notice from receiving this medication? Side effects that you should report to your doctor or health care professional as soon as possible: allergic reactions like skin rash, itching or hives, swelling of the face, lips, or tongue back pain dizziness or feeling faint fever pain, redness, or   irritation at site where injected pinpoint red spots on the skin shortness of breath or breathing problems signs and symptoms of kidney injury like trouble passing urine, change in the amount of urine, or red or dark-brown urine stomach or side pain, or pain at  the shoulder swelling tiredness unusual bleeding or bruising Side effects that usually do not require medical attention (report to your doctor or health care professional if they continue or are bothersome): bone pain cough diarrhea hair loss headache muscle pain This list may not describe all possible side effects. Call your doctor for medical advice about side effects. You may report side effects to FDA at 1-800-FDA-1088. Where should I keep my medication? Keep out of the reach of children. Store in a refrigerator between 2 and 8 degrees C (36 and 46 degrees F). Do not freeze. Keep in carton to protect from light. Throw away this medicine if vials or syringes are left out of the refrigerator for more than 24 hours. Throw away any unused medicine after the expiration date. NOTE: This sheet is a summary. It may not cover all possible information. If you have questions about this medicine, talk to your doctor, pharmacist, or health care provider.  2022 Elsevier/Gold Standard (2019-05-25 18:47:55)  

## 2021-01-06 LAB — BPAM RBC
Blood Product Expiration Date: 202209232359
Blood Product Expiration Date: 202209242359
ISSUE DATE / TIME: 202208190738
ISSUE DATE / TIME: 202208190738
Unit Type and Rh: 5100
Unit Type and Rh: 5100

## 2021-01-06 LAB — TYPE AND SCREEN
ABO/RH(D): O POS
Antibody Screen: NEGATIVE
Unit division: 0
Unit division: 0

## 2021-01-09 NOTE — Progress Notes (Signed)
[facility]  [address] [place],  [place]  [address] [phone]  Clinic Day:  01/17/2021  Referring physician: Nicoletta Dress, MD  This document serves as a record of services personally performed by Reginaldo Hazard Macarthur Critchley, MD. It was created on their behalf by [facility], a trained medical scribe. The creation of this record is based on the scribe's personal observations and the provider's statements to them.  HISTORY OF PRESENT ILLNESS:  The patient is an 87 y.o. female with anemia secondary to chronic renal insufficiency and myelodysplasia.  She has been receiving  white and red cell shot therapy on a weekly basis.  She comes in today for repeat clinical assessment.  Although she complains of weakness, she feels okay today. She denies having any overt forms of blood loss or any significant changes in her health since her last visit.  PHYSICAL EXAM:  Blood pressure (!) 162/76, weight 137 lb 4.8 oz (62.3 kg). Wt Readings from Last 3 Encounters:  01/24/21 136 lb 4 oz (61.8 kg)  01/17/21 137 lb 1.9 oz (62.2 kg)  01/17/21 137 lb 4.8 oz (62.3 kg)   Body mass index is 23.57 kg/m. Performance status (ECOG): 2 Physical Exam Constitutional:      Appearance: Normal appearance. She is not ill-appearing.  HENT:     Mouth/Throat:     Mouth: Mucous membranes are moist.     Pharynx: Oropharynx is clear. No oropharyngeal exudate or posterior oropharyngeal erythema.  Cardiovascular:     Rate and Rhythm: Normal rate and regular rhythm.     Heart sounds: No murmur heard.   No friction rub. No gallop.  Pulmonary:     Effort: Pulmonary effort is normal. No respiratory distress.     Breath sounds: Normal breath sounds. No wheezing, rhonchi or rales.  Abdominal:     General: Bowel sounds are normal. There is no distension.     Palpations: Abdomen is soft. There is no mass.     Tenderness: There is no abdominal tenderness.  Musculoskeletal:         General: No swelling.     Right lower leg: No edema.     Left lower leg: No edema.  Lymphadenopathy:     Cervical: No cervical adenopathy.     Upper Body:     Right upper body: No supraclavicular or axillary adenopathy.     Left upper body: No supraclavicular or axillary adenopathy.     Lower Body: No right inguinal adenopathy. No left inguinal adenopathy.  Skin:    General: Skin is warm.     Coloration: Skin is not jaundiced.     Findings: No lesion or rash.  Neurological:     General: No focal deficit present.     Mental Status: She is alert and oriented to person, place, and time. Mental status is at baseline.     Cranial Nerves: Cranial nerves are intact.  Psychiatric:        Mood and Affect: Mood normal.        Behavior: Behavior normal.        Thought Content: Thought content normal.    LABS:   CBC Latest Ref Rng & Units 01/17/2021 12/20/2020 11/08/2020  WBC - 5.9 4.8 4.9  Hemoglobin 12.0 - 16.0 9.9(A) 7.3(A) 6.5(A)  Hematocrit 36 - 46 32(A) 24(A) 21(A)  Platelets 150 - 399 255 281 227   CMP Latest Ref Rng & Units 11/22/2020 10/17/2020 08/13/2020  Glucose 65 -  99 mg/dL - - -  BUN 4 - 21 41(A) 45(A) 42(A)  Creatinine 0.5 - 1.1 1.8(A) 1.9(A) 2.0(A)  Sodium 137 - 147 134(A) 137 136(A)  Potassium 3.4 - 5.3 4.5 4.3 4.2  Chloride 99 - 108 101 105 104  CO2 13 - 22 24(A) 27(A) 26(A)  Calcium 8.7 - 10.7 8.3(A) 8.2(A) 8.3(A)  Total Protein g/dL 6.1 - -  Total Bilirubin 0.0 - 1.2 mg/dL - - -  Alkaline Phos 25 - 125 103 122 120  AST 13 - 35 27 28 25   ALT 7 - 35 14 15 14     ASSESSMENT & PLAN:  Assessment/Plan:  An 87 y.o. female whose anemia appears to be due to both renal insufficiency and myelodysplasia.  There has been improvement in her hemoglobin over these past few months.  Based upon this, she will continue both white and red cell shot therapy on a weekly basis, as synergy between the shots has been seen with respect to improving one's hemoglobin.  Her CBC will continue to be  checked every 4 weeks.  I will see her back in 8 weeks for repeat clinical assessment.  The patient understands all the plans discussed today and is in agreement with them.     I, Rita Ohara, am acting as scribe for [name], MD    I have reviewed this report as typed by the medical scribe, and it is complete and accurate.  Grae Cannata Macarthur Critchley, MD

## 2021-01-10 ENCOUNTER — Other Ambulatory Visit: Payer: Self-pay

## 2021-01-10 ENCOUNTER — Inpatient Hospital Stay: Payer: Medicare PPO

## 2021-01-10 VITALS — BP 142/60 | HR 62 | Temp 98.4°F | Resp 18 | Ht 64.0 in | Wt 126.1 lb

## 2021-01-10 DIAGNOSIS — D460 Refractory anemia without ring sideroblasts, so stated: Secondary | ICD-10-CM | POA: Diagnosis not present

## 2021-01-10 DIAGNOSIS — N189 Chronic kidney disease, unspecified: Secondary | ICD-10-CM | POA: Diagnosis not present

## 2021-01-10 DIAGNOSIS — N184 Chronic kidney disease, stage 4 (severe): Secondary | ICD-10-CM

## 2021-01-10 DIAGNOSIS — Z79899 Other long term (current) drug therapy: Secondary | ICD-10-CM | POA: Diagnosis not present

## 2021-01-10 DIAGNOSIS — D631 Anemia in chronic kidney disease: Secondary | ICD-10-CM | POA: Diagnosis not present

## 2021-01-10 MED ORDER — FILGRASTIM-SNDZ 480 MCG/0.8ML IJ SOSY
480.0000 ug | PREFILLED_SYRINGE | Freq: Once | INTRAMUSCULAR | Status: AC
  Administered 2021-01-10: 480 ug via SUBCUTANEOUS
  Filled 2021-01-10: qty 0.8

## 2021-01-10 MED ORDER — EPOETIN ALFA-EPBX 40000 UNIT/ML IJ SOLN
40000.0000 [IU] | Freq: Once | INTRAMUSCULAR | Status: AC
  Administered 2021-01-10: 40000 [IU] via SUBCUTANEOUS
  Filled 2021-01-10: qty 1

## 2021-01-10 NOTE — Patient Instructions (Signed)
Filgrastim, G-CSF injection What is this medication? FILGRASTIM, G-CSF (fil GRA stim) is a granulocyte colony-stimulating factor that stimulates the growth of neutrophils, a type of white blood cell (WBC) important in the body's fight against infection. It is used to reduce the incidence of fever and infection in patients with certain types of cancer who are receiving chemotherapy that affects the bone marrow, to stimulate blood cell production for removal of WBCs from the body prior to a bone marrow transplantation, to reduce the incidence of fever and infection in patients who have severe chronic neutropenia, and to improve survival outcomes followinghigh-dose radiation exposure that is toxic to the bone marrow. This medicine may be used for other purposes; ask your health care provider orpharmacist if you have questions. COMMON BRAND NAME(S): Neupogen, Nivestym, Releuko, Zarxio What should I tell my care team before I take this medication? They need to know if you have any of these conditions: kidney disease latex allergy ongoing radiation therapy sickle cell disease an unusual or allergic reaction to filgrastim, pegfilgrastim, other medicines, foods, dyes, or preservatives pregnant or trying to get pregnant breast-feeding How should I use this medication? This medicine is for injection under the skin or infusion into a vein. As an infusion into a vein, it is usually given by a health care professional in a hospital or clinic setting. If you get this medicine at home, you will be taught how to prepare and give this medicine. Refer to the Instructions for Use that come with your medication packaging. Use exactly as directed. Take your medicine at regular intervals. Do not take your medicine more often thandirected. It is important that you put your used needles and syringes in a special sharps container. Do not put them in a trash can. If you do not have a sharpscontainer, call your pharmacist or  healthcare provider to get one. Talk to your pediatrician regarding the use of this medicine in children. While this drug may be prescribed for children as young as 7 months for selectedconditions, precautions do apply. Overdosage: If you think you have taken too much of this medicine contact apoison control center or emergency room at once. NOTE: This medicine is only for you. Do not share this medicine with others. What if I miss a dose? It is important not to miss your dose. Call your doctor or health careprofessional if you miss a dose. What may interact with this medication? This medicine may interact with the following medications: medicines that may cause a release of neutrophils, such as lithium This list may not describe all possible interactions. Give your health care provider a list of all the medicines, herbs, non-prescription drugs, or dietary supplements you use. Also tell them if you smoke, drink alcohol, or use illegaldrugs. Some items may interact with your medicine. What should I watch for while using this medication? Your condition will be monitored carefully while you are receiving thismedicine. You may need blood work done while you are taking this medicine. Talk to your health care provider about your risk of cancer. You may be more atrisk for certain types of cancer if you take this medicine. What side effects may I notice from receiving this medication? Side effects that you should report to your doctor or health care professionalas soon as possible: allergic reactions like skin rash, itching or hives, swelling of the face, lips, or tongue back pain dizziness or feeling faint fever pain, redness, or irritation at site where injected pinpoint red spots on the skin  shortness of breath or breathing problems signs and symptoms of kidney injury like trouble passing urine, change in the amount of urine, or red or dark-brown urine stomach or side pain, or pain at the  shoulder swelling tiredness unusual bleeding or bruising Side effects that usually do not require medical attention (report to yourdoctor or health care professional if they continue or are bothersome): bone pain cough diarrhea hair loss headache muscle pain This list may not describe all possible side effects. Call your doctor for medical advice about side effects. You may report side effects to FDA at1-800-FDA-1088. Where should I keep my medication? Keep out of the reach of children. Store in a refrigerator between 2 and 8 degrees C (36 and 46 degrees F). Do not freeze. Keep in carton to protect from light. Throw away this medicine if vials or syringes are left out of the refrigerator for more than 24 hours. Throw awayany unused medicine after the expiration date. NOTE: This sheet is a summary. It may not cover all possible information. If you have questions about this medicine, talk to your doctor, pharmacist, orhealth care provider.  2022 Elsevier/Gold Standard (2019-05-25 18:47:55) Epoetin Alfa injection What is this medication? EPOETIN ALFA (e POE e tin AL fa) helps your body make more red blood cells. This medicine is used to treat anemia caused by chronic kidney disease, cancer chemotherapy, or HIV-therapy. It may also be used before surgery if you haveanemia. This medicine may be used for other purposes; ask your health care provider orpharmacist if you have questions. COMMON BRAND NAME(S): Epogen, Procrit, Retacrit What should I tell my care team before I take this medication? They need to know if you have any of these conditions: cancer heart disease high blood pressure history of blood clots history of stroke low levels of folate, iron, or vitamin B12 in the blood seizures an unusual or allergic reaction to erythropoietin, albumin, benzyl alcohol, hamster proteins, other medicines, foods, dyes, or preservatives pregnant or trying to get pregnant breast-feeding How  should I use this medication? This medicine is for injection into a vein or under the skin. It is [place] by a health care professional in a hospital or clinic setting. If you get this medicine at home, you will be taught how to prepare and give this medicine. Use exactly as directed. Take your medicine at regularintervals. Do not take your medicine more often than directed. It is important that you put your used needles and syringes in a special sharps container. Do not put them in a trash can. If you do not have a sharpscontainer, call your pharmacist or healthcare provider to get one. A special MedGuide will be given to you by the pharmacist with eachprescription and refill. Be sure to read this information carefully each time. Talk to your pediatrician regarding the use of this medicine in children. Whilethis drug may be prescribed for selected conditions, precautions do apply. Overdosage: If you think you have taken too much of this medicine contact apoison control center or emergency room at once. NOTE: This medicine is only for you. Do not share this medicine with others. What if I miss a dose? If you miss a dose, take it as soon as you can. If it is almost time for yournext dose, take only that dose. Do not take double or extra doses. What may interact with this medication? Interactions have not been studied. This list may not describe all possible interactions. Give your health care provider a list of  all the medicines, herbs, non-prescription drugs, or dietary supplements you use. Also tell them if you smoke, drink alcohol, or use illegaldrugs. Some items may interact with your medicine. What should I watch for while using this medication? Your condition will be monitored carefully while you are receiving thismedicine. You may need blood work done while you are taking this medicine. This medicine may cause a decrease in vitamin B6. You should make sure that you get enough vitamin B6  while you are taking this medicine. Discuss the foods youeat and the vitamins you take with your health care professional. What side effects may I notice from receiving this medication? Side effects that you should report to your doctor or health care professionalas soon as possible: allergic reactions like skin rash, itching or hives, swelling of the face, lips, or tongue seizures signs and symptoms of a blood clot such as breathing problems; changes in vision; chest pain; severe, sudden headache; pain, swelling, warmth in the leg; trouble speaking; sudden numbness or weakness of the face, arm or leg signs and symptoms of a stroke like changes in vision; confusion; trouble speaking or understanding; severe headaches; sudden numbness or weakness of the face, arm or leg; trouble walking; dizziness; loss of balance or coordination Side effects that usually do not require medical attention (report to yourdoctor or health care professional if they continue or are bothersome): chills cough dizziness fever headaches joint pain muscle cramps muscle pain nausea, vomiting pain, redness, or irritation at site where injected This list may not describe all possible side effects. Call your doctor for medical advice about side effects. You may report side effects to FDA at1-800-FDA-1088. Where should I keep my medication? Keep out of the reach of children. Store in a refrigerator between 2 and 8 degrees C (36 and 46 degrees F). Do not freeze or shake. Throw away any unused portion if using a single-dose vial. Multi-dose vials can be kept in the refrigerator for up to 21 days after theinitial dose. Throw away unused medicine. NOTE: This sheet is a summary. It may not cover all possible information. If you have questions about this medicine, talk to your doctor, pharmacist, orhealth care provider.  2022 Elsevier/Gold Standard (2016-12-11 08:35:19)

## 2021-01-10 NOTE — Progress Notes (Signed)
Discharged home, stable  

## 2021-01-13 ENCOUNTER — Other Ambulatory Visit: Payer: Self-pay | Admitting: Cardiology

## 2021-01-16 DIAGNOSIS — Z9181 History of falling: Secondary | ICD-10-CM | POA: Diagnosis not present

## 2021-01-16 DIAGNOSIS — Z139 Encounter for screening, unspecified: Secondary | ICD-10-CM | POA: Diagnosis not present

## 2021-01-16 DIAGNOSIS — Z0000 Encounter for general adult medical examination without abnormal findings: Secondary | ICD-10-CM | POA: Diagnosis not present

## 2021-01-16 DIAGNOSIS — Z1331 Encounter for screening for depression: Secondary | ICD-10-CM | POA: Diagnosis not present

## 2021-01-16 DIAGNOSIS — E785 Hyperlipidemia, unspecified: Secondary | ICD-10-CM | POA: Diagnosis not present

## 2021-01-17 ENCOUNTER — Inpatient Hospital Stay: Payer: Medicare PPO

## 2021-01-17 ENCOUNTER — Other Ambulatory Visit: Payer: Self-pay

## 2021-01-17 ENCOUNTER — Inpatient Hospital Stay: Payer: Medicare PPO | Attending: Oncology | Admitting: Oncology

## 2021-01-17 ENCOUNTER — Other Ambulatory Visit: Payer: Self-pay | Admitting: Hematology and Oncology

## 2021-01-17 ENCOUNTER — Other Ambulatory Visit: Payer: Self-pay | Admitting: Oncology

## 2021-01-17 VITALS — BP 162/76 | Wt 137.3 lb

## 2021-01-17 VITALS — BP 155/60 | HR 63 | Temp 98.3°F | Resp 18 | Ht 64.0 in | Wt 137.1 lb

## 2021-01-17 DIAGNOSIS — D460 Refractory anemia without ring sideroblasts, so stated: Secondary | ICD-10-CM | POA: Insufficient documentation

## 2021-01-17 DIAGNOSIS — D638 Anemia in other chronic diseases classified elsewhere: Secondary | ICD-10-CM

## 2021-01-17 DIAGNOSIS — N189 Chronic kidney disease, unspecified: Secondary | ICD-10-CM | POA: Diagnosis not present

## 2021-01-17 DIAGNOSIS — D631 Anemia in chronic kidney disease: Secondary | ICD-10-CM | POA: Insufficient documentation

## 2021-01-17 DIAGNOSIS — D4620 Refractory anemia with excess of blasts, unspecified: Secondary | ICD-10-CM

## 2021-01-17 DIAGNOSIS — Z23 Encounter for immunization: Secondary | ICD-10-CM | POA: Diagnosis not present

## 2021-01-17 DIAGNOSIS — D649 Anemia, unspecified: Secondary | ICD-10-CM

## 2021-01-17 DIAGNOSIS — D469 Myelodysplastic syndrome, unspecified: Secondary | ICD-10-CM

## 2021-01-17 DIAGNOSIS — N184 Chronic kidney disease, stage 4 (severe): Secondary | ICD-10-CM

## 2021-01-17 DIAGNOSIS — Z79899 Other long term (current) drug therapy: Secondary | ICD-10-CM | POA: Diagnosis not present

## 2021-01-17 LAB — CBC AND DIFFERENTIAL
HCT: 32 — AB (ref 36–46)
Hemoglobin: 9.9 — AB (ref 12.0–16.0)
Neutrophils Absolute: 3.42
Platelets: 255 (ref 150–399)
WBC: 5.9

## 2021-01-17 LAB — IRON AND TIBC
Iron: 11 ug/dL — ABNORMAL LOW (ref 28–170)
Saturation Ratios: 3 % — ABNORMAL LOW (ref 10.4–31.8)
TIBC: 337 ug/dL (ref 250–450)
UIBC: 326 ug/dL

## 2021-01-17 LAB — FERRITIN: Ferritin: 25 ng/mL (ref 11–307)

## 2021-01-17 LAB — CBC: RBC: 3.74 — AB (ref 3.87–5.11)

## 2021-01-17 MED ORDER — FILGRASTIM-SNDZ 480 MCG/0.8ML IJ SOSY
480.0000 ug | PREFILLED_SYRINGE | Freq: Once | INTRAMUSCULAR | Status: AC
  Administered 2021-01-17: 480 ug via SUBCUTANEOUS
  Filled 2021-01-17: qty 0.8

## 2021-01-17 MED ORDER — EPOETIN ALFA-EPBX 40000 UNIT/ML IJ SOLN
40000.0000 [IU] | Freq: Once | INTRAMUSCULAR | Status: AC
  Administered 2021-01-17: 40000 [IU] via SUBCUTANEOUS
  Filled 2021-01-17: qty 1

## 2021-01-17 NOTE — Patient Instructions (Signed)
Epoetin Alfa injection What is this medication? EPOETIN ALFA (e POE e tin AL fa) helps your body make more red blood cells. This medicine is used to treat anemia caused by chronic kidney disease, cancer chemotherapy, or HIV-therapy. It may also be used before surgery if you have anemia. This medicine may be used for other purposes; ask your health care provider or pharmacist if you have questions. COMMON BRAND NAME(S): Epogen, Procrit, Retacrit What should I tell my care team before I take this medication? They need to know if you have any of these conditions: cancer heart disease high blood pressure history of blood clots history of stroke low levels of folate, iron, or vitamin B12 in the blood seizures an unusual or allergic reaction to erythropoietin, albumin, benzyl alcohol, hamster proteins, other medicines, foods, dyes, or preservatives pregnant or trying to get pregnant breast-feeding How should I use this medication? This medicine is for injection into a vein or under the skin. It is usually given by a health care professional in a hospital or clinic setting. If you get this medicine at home, you will be taught how to prepare and give this medicine. Use exactly as directed. Take your medicine at regular intervals. Do not take your medicine more often than directed. It is important that you put your used needles and syringes in a special sharps container. Do not put them in a trash can. If you do not have a sharps container, call your pharmacist or healthcare provider to get one. A special MedGuide will be given to you by the pharmacist with each prescription and refill. Be sure to read this information carefully each time. Talk to your pediatrician regarding the use of this medicine in children. While this drug may be prescribed for selected conditions, precautions do apply. Overdosage: If you think you have taken too much of this medicine contact a poison control center or emergency  room at once. NOTE: This medicine is only for you. Do not share this medicine with others. What if I miss a dose? If you miss a dose, take it as soon as you can. If it is almost time for your next dose, take only that dose. Do not take double or extra doses. What may interact with this medication? Interactions have not been studied. This list may not describe all possible interactions. Give your health care provider a list of all the medicines, herbs, non-prescription drugs, or dietary supplements you use. Also tell them if you smoke, drink alcohol, or use illegal drugs. Some items may interact with your medicine. What should I watch for while using this medication? Your condition will be monitored carefully while you are receiving this medicine. You may need blood work done while you are taking this medicine. This medicine may cause a decrease in vitamin B6. You should make sure that you get enough vitamin B6 while you are taking this medicine. Discuss the foods you eat and the vitamins you take with your health care professional. What side effects may I notice from receiving this medication? Side effects that you should report to your doctor or health care professional as soon as possible: allergic reactions like skin rash, itching or hives, swelling of the face, lips, or tongue seizures signs and symptoms of a blood clot such as breathing problems; changes in vision; chest pain; severe, sudden headache; pain, swelling, warmth in the leg; trouble speaking; sudden numbness or weakness of the face, arm or leg signs and symptoms of a stroke like   changes in vision; confusion; trouble speaking or understanding; severe headaches; sudden numbness or weakness of the face, arm or leg; trouble walking; dizziness; loss of balance or coordination Side effects that usually do not require medical attention (report to your doctor or health care professional if they continue or are  bothersome): chills cough dizziness fever headaches joint pain muscle cramps muscle pain nausea, vomiting pain, redness, or irritation at site where injected This list may not describe all possible side effects. Call your doctor for medical advice about side effects. You may report side effects to FDA at 1-800-FDA-1088. Where should I keep my medication? Keep out of the reach of children. Store in a refrigerator between 2 and 8 degrees C (36 and 46 degrees F). Do not freeze or shake. Throw away any unused portion if using a single-dose vial. Multi-dose vials can be kept in the refrigerator for up to 21 days after the initial dose. Throw away unused medicine. NOTE: This sheet is a summary. It may not cover all possible information. If you have questions about this medicine, talk to your doctor, pharmacist, or health care provider.  2022 Elsevier/Gold Standard (2016-12-11 08:35:19) Filgrastim, G-CSF injection What is this medication? FILGRASTIM, G-CSF (fil GRA stim) is a granulocyte colony-stimulating factor that stimulates the growth of neutrophils, a type of white blood cell (WBC) important in the body's fight against infection. It is used to reduce the incidence of fever and infection in patients with certain types of cancer who are receiving chemotherapy that affects the bone marrow, to stimulate blood cell production for removal of WBCs from the body prior to a bone marrow transplantation, to reduce the incidence of fever and infection in patients who have severe chronic neutropenia, and to improve survival outcomes following high-dose radiation exposure that is toxic to the bone marrow. This medicine may be used for other purposes; ask your health care provider or pharmacist if you have questions. COMMON BRAND NAME(S): Neupogen, Nivestym, Releuko, Zarxio What should I tell my care team before I take this medication? They need to know if you have any of these conditions: kidney  disease latex allergy ongoing radiation therapy sickle cell disease an unusual or allergic reaction to filgrastim, pegfilgrastim, other medicines, foods, dyes, or preservatives pregnant or trying to get pregnant breast-feeding How should I use this medication? This medicine is for injection under the skin or infusion into a vein. As an infusion into a vein, it is usually given by a health care professional in a hospital or clinic setting. If you get this medicine at home, you will be taught how to prepare and give this medicine. Refer to the Instructions for Use that come with your medication packaging. Use exactly as directed. Take your medicine at regular intervals. Do not take your medicine more often than directed. It is important that you put your used needles and syringes in a special sharps container. Do not put them in a trash can. If you do not have a sharps container, call your pharmacist or healthcare provider to get one. Talk to your pediatrician regarding the use of this medicine in children. While this drug may be prescribed for children as young as 7 months for selected conditions, precautions do apply. Overdosage: If you think you have taken too much of this medicine contact a poison control center or emergency room at once. NOTE: This medicine is only for you. Do not share this medicine with others. What if I miss a dose? It is important not   to miss your dose. Call your doctor or health care professional if you miss a dose. What may interact with this medication? This medicine may interact with the following medications: medicines that may cause a release of neutrophils, such as lithium This list may not describe all possible interactions. Give your health care provider a list of all the medicines, herbs, non-prescription drugs, or dietary supplements you use. Also tell them if you smoke, drink alcohol, or use illegal drugs. Some items may interact with your medicine. What should  I watch for while using this medication? Your condition will be monitored carefully while you are receiving this medicine. You may need blood work done while you are taking this medicine. Talk to your health care provider about your risk of cancer. You may be more at risk for certain types of cancer if you take this medicine. What side effects may I notice from receiving this medication? Side effects that you should report to your doctor or health care professional as soon as possible: allergic reactions like skin rash, itching or hives, swelling of the face, lips, or tongue back pain dizziness or feeling faint fever pain, redness, or irritation at site where injected pinpoint red spots on the skin shortness of breath or breathing problems signs and symptoms of kidney injury like trouble passing urine, change in the amount of urine, or red or dark-brown urine stomach or side pain, or pain at the shoulder swelling tiredness unusual bleeding or bruising Side effects that usually do not require medical attention (report to your doctor or health care professional if they continue or are bothersome): bone pain cough diarrhea hair loss headache muscle pain This list may not describe all possible side effects. Call your doctor for medical advice about side effects. You may report side effects to FDA at 1-800-FDA-1088. Where should I keep my medication? Keep out of the reach of children. Store in a refrigerator between 2 and 8 degrees C (36 and 46 degrees F). Do not freeze. Keep in carton to protect from light. Throw away this medicine if vials or syringes are left out of the refrigerator for more than 24 hours. Throw away any unused medicine after the expiration date. NOTE: This sheet is a summary. It may not cover all possible information. If you have questions about this medicine, talk to your doctor, pharmacist, or health care provider.  2022 Elsevier/Gold Standard (2019-05-25  18:47:55)  

## 2021-01-21 ENCOUNTER — Other Ambulatory Visit: Payer: Self-pay

## 2021-01-24 ENCOUNTER — Ambulatory Visit: Payer: Medicare PPO

## 2021-01-24 ENCOUNTER — Other Ambulatory Visit: Payer: Self-pay

## 2021-01-24 ENCOUNTER — Inpatient Hospital Stay: Payer: Medicare PPO

## 2021-01-24 VITALS — BP 130/60 | HR 71 | Temp 97.9°F | Resp 18 | Ht 64.0 in | Wt 136.2 lb

## 2021-01-24 DIAGNOSIS — Z79899 Other long term (current) drug therapy: Secondary | ICD-10-CM | POA: Diagnosis not present

## 2021-01-24 DIAGNOSIS — N189 Chronic kidney disease, unspecified: Secondary | ICD-10-CM | POA: Diagnosis not present

## 2021-01-24 DIAGNOSIS — D631 Anemia in chronic kidney disease: Secondary | ICD-10-CM | POA: Diagnosis not present

## 2021-01-24 DIAGNOSIS — Z23 Encounter for immunization: Secondary | ICD-10-CM | POA: Diagnosis not present

## 2021-01-24 DIAGNOSIS — D460 Refractory anemia without ring sideroblasts, so stated: Secondary | ICD-10-CM | POA: Diagnosis not present

## 2021-01-24 MED ORDER — EPOETIN ALFA-EPBX 40000 UNIT/ML IJ SOLN
40000.0000 [IU] | Freq: Once | INTRAMUSCULAR | Status: AC
Start: 2021-01-24 — End: 2021-01-24
  Administered 2021-01-24: 40000 [IU] via SUBCUTANEOUS
  Filled 2021-01-24: qty 1

## 2021-01-24 MED ORDER — FILGRASTIM-SNDZ 480 MCG/0.8ML IJ SOSY
480.0000 ug | PREFILLED_SYRINGE | Freq: Once | INTRAMUSCULAR | Status: AC
  Administered 2021-01-24: 480 ug via SUBCUTANEOUS
  Filled 2021-01-24: qty 0.8

## 2021-01-24 NOTE — Patient Instructions (Signed)
Filgrastim, G-CSF injection What is this medication? FILGRASTIM, G-CSF (fil GRA stim) is a granulocyte colony-stimulating factor that stimulates the growth of neutrophils, a type of white blood cell (WBC) important in the body's fight against infection. It is used to reduce the incidence of fever and infection in patients with certain types of cancer who are receiving chemotherapy that affects the bone marrow, to stimulate blood cell production for removal of WBCs from the body prior to a bone marrow transplantation, to reduce the incidence of fever and infection in patients who have severe chronic neutropenia, and to improve survival outcomes following high-dose radiation exposure that is toxic to the bone marrow. This medicine may be used for other purposes; ask your health care provider or pharmacist if you have questions. COMMON BRAND NAME(S): Neupogen, Nivestym, Releuko, Zarxio What should I tell my care team before I take this medication? They need to know if you have any of these conditions: kidney disease latex allergy ongoing radiation therapy sickle cell disease an unusual or allergic reaction to filgrastim, pegfilgrastim, other medicines, foods, dyes, or preservatives pregnant or trying to get pregnant breast-feeding How should I use this medication? This medicine is for injection under the skin or infusion into a vein. As an infusion into a vein, it is usually given by a health care professional in a hospital or clinic setting. If you get this medicine at home, you will be taught how to prepare and give this medicine. Refer to the Instructions for Use that come with your medication packaging. Use exactly as directed. Take your medicine at regular intervals. Do not take your medicine more often than directed. It is important that you put your used needles and syringes in a special sharps container. Do not put them in a trash can. If you do not have a sharps container, call your pharmacist  or healthcare provider to get one. Talk to your pediatrician regarding the use of this medicine in children. While this drug may be prescribed for children as young as 7 months for selected conditions, precautions do apply. Overdosage: If you think you have taken too much of this medicine contact a poison control center or emergency room at once. NOTE: This medicine is only for you. Do not share this medicine with others. What if I miss a dose? It is important not to miss your dose. Call your doctor or health care professional if you miss a dose. What may interact with this medication? This medicine may interact with the following medications: medicines that may cause a release of neutrophils, such as lithium This list may not describe all possible interactions. Give your health care provider a list of all the medicines, herbs, non-prescription drugs, or dietary supplements you use. Also tell them if you smoke, drink alcohol, or use illegal drugs. Some items may interact with your medicine. What should I watch for while using this medication? Your condition will be monitored carefully while you are receiving this medicine. You may need blood work done while you are taking this medicine. Talk to your health care provider about your risk of cancer. You may be more at risk for certain types of cancer if you take this medicine. What side effects may I notice from receiving this medication? Side effects that you should report to your doctor or health care professional as soon as possible: allergic reactions like skin rash, itching or hives, swelling of the face, lips, or tongue back pain dizziness or feeling faint fever pain, redness, or   shortness of breath or breathing problems signs and symptoms of kidney injury like trouble passing urine, change in the amount of urine, or red or dark-brown urine stomach or side pain, or pain at the  shoulder swelling tiredness unusual bleeding or bruising Side effects that usually do not require medical attention (report to yourdoctor or health care professional if they continue or are bothersome): bone pain cough diarrhea hair loss headache muscle pain This list may not describe all possible side effects. Call your doctor for medical advice about side effects. You may report side effects to FDA at1-800-FDA-1088. Where should I keep my medication? Keep out of the reach of children. Store in a refrigerator between 2 and 8 degrees C (36 and 46 degrees F). Do not freeze. Keep in carton to protect from light. Throw away this medicine if vials or syringes are left out of the refrigerator for more than 24 hours. Throw awayany unused medicine after the expiration date. NOTE: This sheet is a summary. It may not cover all possible information. If you have questions about this medicine, talk to your doctor, pharmacist, orhealth care provider.  2022 Elsevier/Gold Standard (2019-05-25 18:47:55) Epoetin Alfa injection What is this medication? EPOETIN ALFA (e POE e tin AL fa) helps your body make more red blood cells. This medicine is used to treat anemia caused by chronic kidney disease, cancer chemotherapy, or HIV-therapy. It may also be used before surgery if you haveanemia. This medicine may be used for other purposes; ask your health care provider orpharmacist if you have questions. COMMON BRAND NAME(S): Epogen, Procrit, Retacrit What should I tell my care team before I take this medication? They need to know if you have any of these conditions: cancer heart disease high blood pressure history of blood clots history of stroke low levels of folate, iron, or vitamin B12 in the blood seizures an unusual or allergic reaction to erythropoietin, albumin, benzyl alcohol, hamster proteins, other medicines, foods, dyes, or preservatives pregnant or trying to get pregnant breast-feeding How  should I use this medication? This medicine is for injection into a vein or under the skin. It is usuallygiven by a health care professional in a hospital or clinic setting. If you get this medicine at home, you will be taught how to prepare and give this medicine. Use exactly as directed. Take your medicine at regularintervals. Do not take your medicine more often than directed. It is important that you put your used needles and syringes in a special sharps container. Do not put them in a trash can. If you do not have a sharpscontainer, call your pharmacist or healthcare provider to get one. A special MedGuide will be given to you by the pharmacist with eachprescription and refill. Be sure to read this information carefully each time. Talk to your pediatrician regarding the use of this medicine in children. Whilethis drug may be prescribed for selected conditions, precautions do apply. Overdosage: If you think you have taken too much of this medicine contact apoison control center or emergency room at once. NOTE: This medicine is only for you. Do not share this medicine with others. What if I miss a dose? If you miss a dose, take it as soon as you can. If it is almost time for yournext dose, take only that dose. Do not take double or extra doses. What may interact with this medication? Interactions have not been studied. This list may not describe all possible interactions. Give your health care provider a list of   all the medicines, herbs, non-prescription drugs, or dietary supplements you use. Also tell them if you smoke, drink alcohol, or use illegaldrugs. Some items may interact with your medicine. What should I watch for while using this medication? Your condition will be monitored carefully while you are receiving thismedicine. You may need blood work done while you are taking this medicine. This medicine may cause a decrease in vitamin B6. You should make sure that you get enough vitamin B6  while you are taking this medicine. Discuss the foods youeat and the vitamins you take with your health care professional. What side effects may I notice from receiving this medication? Side effects that you should report to your doctor or health care professionalas soon as possible: allergic reactions like skin rash, itching or hives, swelling of the face, lips, or tongue seizures signs and symptoms of a blood clot such as breathing problems; changes in vision; chest pain; severe, sudden headache; pain, swelling, warmth in the leg; trouble speaking; sudden numbness or weakness of the face, arm or leg signs and symptoms of a stroke like changes in vision; confusion; trouble speaking or understanding; severe headaches; sudden numbness or weakness of the face, arm or leg; trouble walking; dizziness; loss of balance or coordination Side effects that usually do not require medical attention (report to yourdoctor or health care professional if they continue or are bothersome): chills cough dizziness fever headaches joint pain muscle cramps muscle pain nausea, vomiting pain, redness, or irritation at site where injected This list may not describe all possible side effects. Call your doctor for medical advice about side effects. You may report side effects to FDA at1-800-FDA-1088. Where should I keep my medication? Keep out of the reach of children. Store in a refrigerator between 2 and 8 degrees C (36 and 46 degrees F). Do not freeze or shake. Throw away any unused portion if using a single-dose vial. Multi-dose vials can be kept in the refrigerator for up to 21 days after theinitial dose. Throw away unused medicine. NOTE: This sheet is a summary. It may not cover all possible information. If you have questions about this medicine, talk to your doctor, pharmacist, orhealth care provider.  2022 Elsevier/Gold Standard (2016-12-11 08:35:19)  

## 2021-01-27 ENCOUNTER — Telehealth: Payer: Self-pay | Admitting: Hematology and Oncology

## 2021-01-27 ENCOUNTER — Encounter: Payer: Self-pay | Admitting: Oncology

## 2021-01-28 ENCOUNTER — Encounter: Payer: Self-pay | Admitting: Oncology

## 2021-01-29 MED FILL — Ferumoxytol Inj 510 MG/17ML (30 MG/ML) (Elemental Fe): INTRAVENOUS | Qty: 17 | Status: AC

## 2021-01-30 ENCOUNTER — Inpatient Hospital Stay: Payer: Medicare PPO

## 2021-01-30 ENCOUNTER — Other Ambulatory Visit: Payer: Self-pay

## 2021-01-30 VITALS — BP 156/87 | HR 74 | Temp 98.8°F | Resp 18 | Ht 64.0 in | Wt 136.2 lb

## 2021-01-30 DIAGNOSIS — D460 Refractory anemia without ring sideroblasts, so stated: Secondary | ICD-10-CM | POA: Diagnosis not present

## 2021-01-30 DIAGNOSIS — Z79899 Other long term (current) drug therapy: Secondary | ICD-10-CM | POA: Diagnosis not present

## 2021-01-30 DIAGNOSIS — N189 Chronic kidney disease, unspecified: Secondary | ICD-10-CM | POA: Diagnosis not present

## 2021-01-30 DIAGNOSIS — Z23 Encounter for immunization: Secondary | ICD-10-CM | POA: Diagnosis not present

## 2021-01-30 DIAGNOSIS — D631 Anemia in chronic kidney disease: Secondary | ICD-10-CM | POA: Diagnosis not present

## 2021-01-30 MED ORDER — SODIUM CHLORIDE 0.9 % IV SOLN
510.0000 mg | Freq: Once | INTRAVENOUS | Status: AC
  Administered 2021-01-30: 510 mg via INTRAVENOUS
  Filled 2021-01-30: qty 17

## 2021-01-30 MED ORDER — FILGRASTIM-SNDZ 480 MCG/0.8ML IJ SOSY: 480.0000 ug | PREFILLED_SYRINGE | Freq: Once | INTRAMUSCULAR | Status: DC

## 2021-01-30 MED ORDER — EPOETIN ALFA-EPBX 40000 UNIT/ML IJ SOLN: 40000.0000 [IU] | Freq: Once | INTRAMUSCULAR | Status: DC

## 2021-01-30 MED ORDER — SODIUM CHLORIDE 0.9 % IV SOLN: Freq: Once | INTRAVENOUS | Status: AC

## 2021-01-30 NOTE — Progress Notes (Signed)
1513: PT STABLE AT TIME OF DISCHARGE  

## 2021-01-30 NOTE — Patient Instructions (Signed)
Ferumoxytol Injection What is this medication? FERUMOXYTOL (FER ue MOX i tol) treats low levels of iron in your body (iron deficiency anemia). Iron is a mineral that plays an important role in making red blood cells, which carry oxygen from your lungs to the rest of your body. This medicine may be used for other purposes; ask your health care provider or pharmacist if you have questions. COMMON BRAND NAME(S): Feraheme What should I tell my care team before I take this medication? They need to know if you have any of these conditions: Anemia not caused by low iron levels High levels of iron in the blood Magnetic resonance imaging (MRI) test scheduled An unusual or allergic reaction to iron, other medications, foods, dyes, or preservatives Pregnant or trying to get pregnant Breast-feeding How should I use this medication? This medication is for injection into a vein. It is given in a hospital or clinic setting. Talk to your care team the use of this medication in children. Special care may be needed. Overdosage: If you think you have taken too much of this medicine contact a poison control center or emergency room at once. NOTE: This medicine is only for you. Do not share this medicine with others. What if I miss a dose? It is important not to miss your dose. Call your care team if you are unable to keep an appointment. What may interact with this medication? Other iron products This list may not describe all possible interactions. Give your health care provider a list of all the medicines, herbs, non-prescription drugs, or dietary supplements you use. Also tell them if you smoke, drink alcohol, or use illegal drugs. Some items may interact with your medicine. What should I watch for while using this medication? Visit your care team regularly. Tell your care team if your symptoms do not start to get better or if they get worse. You may need blood work done while you are taking this  medication. You may need to follow a special diet. Talk to your care team. Foods that contain iron include: whole grains/cereals, dried fruits, beans, or peas, leafy green vegetables, and organ meats (liver, kidney). What side effects may I notice from receiving this medication? Side effects that you should report to your care team as soon as possible: Allergic reactions-skin rash, itching, hives, swelling of the face, lips, tongue, or throat Low blood pressure-dizziness, feeling faint or lightheaded, blurry vision Shortness of breath Side effects that usually do not require medical attention (report to your care team if they continue or are bothersome): Flushing Headache Joint pain Muscle pain Nausea Pain, redness, or irritation at injection site This list may not describe all possible side effects. Call your doctor for medical advice about side effects. You may report side effects to FDA at 1-800-FDA-1088. Where should I keep my medication? This medication is given in a hospital or clinic and will not be stored at home. NOTE: This sheet is a summary. It may not cover all possible information. If you have questions about this medicine, talk to your doctor, pharmacist, or health care provider.  2022 Elsevier/Gold Standard (2020-09-20 15:35:12)  

## 2021-01-31 ENCOUNTER — Inpatient Hospital Stay: Payer: Medicare PPO

## 2021-01-31 ENCOUNTER — Encounter: Payer: Self-pay | Admitting: Oncology

## 2021-01-31 VITALS — BP 145/65 | HR 61 | Temp 97.5°F | Resp 18 | Ht 64.0 in | Wt 134.8 lb

## 2021-01-31 DIAGNOSIS — N189 Chronic kidney disease, unspecified: Secondary | ICD-10-CM | POA: Diagnosis not present

## 2021-01-31 DIAGNOSIS — Z23 Encounter for immunization: Secondary | ICD-10-CM | POA: Diagnosis not present

## 2021-01-31 DIAGNOSIS — Z79899 Other long term (current) drug therapy: Secondary | ICD-10-CM | POA: Diagnosis not present

## 2021-01-31 DIAGNOSIS — D631 Anemia in chronic kidney disease: Secondary | ICD-10-CM | POA: Diagnosis not present

## 2021-01-31 DIAGNOSIS — D460 Refractory anemia without ring sideroblasts, so stated: Secondary | ICD-10-CM | POA: Diagnosis not present

## 2021-01-31 DIAGNOSIS — N184 Chronic kidney disease, stage 4 (severe): Secondary | ICD-10-CM

## 2021-01-31 MED ORDER — EPOETIN ALFA-EPBX 40000 UNIT/ML IJ SOLN
40000.0000 [IU] | Freq: Once | INTRAMUSCULAR | Status: AC
  Administered 2021-01-31: 40000 [IU] via SUBCUTANEOUS
  Filled 2021-01-31: qty 1

## 2021-01-31 MED ORDER — FILGRASTIM-SNDZ 480 MCG/0.8ML IJ SOSY
480.0000 ug | PREFILLED_SYRINGE | Freq: Once | INTRAMUSCULAR | Status: AC
  Administered 2021-01-31: 480 ug via SUBCUTANEOUS
  Filled 2021-01-31: qty 0.8

## 2021-01-31 MED FILL — Ferumoxytol Inj 510 MG/17ML (30 MG/ML) (Elemental Fe): INTRAVENOUS | Qty: 17 | Status: AC

## 2021-01-31 NOTE — Addendum Note (Signed)
Addended by: Juanetta Beets on: 01/31/2021 02:24 PM   Modules accepted: Orders

## 2021-01-31 NOTE — Progress Notes (Signed)
1437:PT STABLE AT TIME OF DISCHARGE °

## 2021-01-31 NOTE — Patient Instructions (Signed)
Filgrastim, G-CSF injection What is this medication? FILGRASTIM, G-CSF (fil GRA stim) is a granulocyte colony-stimulating factor that stimulates the growth of neutrophils, a type of white blood cell (WBC) important in the body's fight against infection. It is used to reduce the incidence of fever and infection in patients with certain types of cancer who are receiving chemotherapy that affects the bone marrow, to stimulate blood cell production for removal of WBCs from the body prior to a bone marrow transplantation, to reduce the incidence of fever and infection in patients who have severe chronic neutropenia, and to improve survival outcomes following high-dose radiation exposure that is toxic to the bone marrow. This medicine may be used for other purposes; ask your health care provider or pharmacist if you have questions. COMMON BRAND NAME(S): Neupogen, Nivestym, Releuko, Zarxio What should I tell my care team before I take this medication? They need to know if you have any of these conditions: kidney disease latex allergy ongoing radiation therapy sickle cell disease an unusual or allergic reaction to filgrastim, pegfilgrastim, other medicines, foods, dyes, or preservatives pregnant or trying to get pregnant breast-feeding How should I use this medication? This medicine is for injection under the skin or infusion into a vein. As an infusion into a vein, it is usually given by a health care professional in a hospital or clinic setting. If you get this medicine at home, you will be taught how to prepare and give this medicine. Refer to the Instructions for Use that come with your medication packaging. Use exactly as directed. Take your medicine at regular intervals. Do not take your medicine more often than directed. It is important that you put your used needles and syringes in a special sharps container. Do not put them in a trash can. If you do not have a sharps container, call your pharmacist  or healthcare provider to get one. Talk to your pediatrician regarding the use of this medicine in children. While this drug may be prescribed for children as young as 7 months for selected conditions, precautions do apply. Overdosage: If you think you have taken too much of this medicine contact a poison control center or emergency room at once. NOTE: This medicine is only for you. Do not share this medicine with others. What if I miss a dose? It is important not to miss your dose. Call your doctor or health care professional if you miss a dose. What may interact with this medication? This medicine may interact with the following medications: medicines that may cause a release of neutrophils, such as lithium This list may not describe all possible interactions. Give your health care provider a list of all the medicines, herbs, non-prescription drugs, or dietary supplements you use. Also tell them if you smoke, drink alcohol, or use illegal drugs. Some items may interact with your medicine. What should I watch for while using this medication? Your condition will be monitored carefully while you are receiving this medicine. You may need blood work done while you are taking this medicine. Talk to your health care provider about your risk of cancer. You may be more at risk for certain types of cancer if you take this medicine. What side effects may I notice from receiving this medication? Side effects that you should report to your doctor or health care professional as soon as possible: allergic reactions like skin rash, itching or hives, swelling of the face, lips, or tongue back pain dizziness or feeling faint fever pain, redness, or   shortness of breath or breathing problems signs and symptoms of kidney injury like trouble passing urine, change in the amount of urine, or red or dark-brown urine stomach or side pain, or pain at the  shoulder swelling tiredness unusual bleeding or bruising Side effects that usually do not require medical attention (report to yourdoctor or health care professional if they continue or are bothersome): bone pain cough diarrhea hair loss headache muscle pain This list may not describe all possible side effects. Call your doctor for medical advice about side effects. You may report side effects to FDA at1-800-FDA-1088. Where should I keep my medication? Keep out of the reach of children. Store in a refrigerator between 2 and 8 degrees C (36 and 46 degrees F). Do not freeze. Keep in carton to protect from light. Throw away this medicine if vials or syringes are left out of the refrigerator for more than 24 hours. Throw awayany unused medicine after the expiration date. NOTE: This sheet is a summary. It may not cover all possible information. If you have questions about this medicine, talk to your doctor, pharmacist, orhealth care provider.  2022 Elsevier/Gold Standard (2019-05-25 18:47:55) Epoetin Alfa injection What is this medication? EPOETIN ALFA (e POE e tin AL fa) helps your body make more red blood cells. This medicine is used to treat anemia caused by chronic kidney disease, cancer chemotherapy, or HIV-therapy. It may also be used before surgery if you haveanemia. This medicine may be used for other purposes; ask your health care provider orpharmacist if you have questions. COMMON BRAND NAME(S): Epogen, Procrit, Retacrit What should I tell my care team before I take this medication? They need to know if you have any of these conditions: cancer heart disease high blood pressure history of blood clots history of stroke low levels of folate, iron, or vitamin B12 in the blood seizures an unusual or allergic reaction to erythropoietin, albumin, benzyl alcohol, hamster proteins, other medicines, foods, dyes, or preservatives pregnant or trying to get pregnant breast-feeding How  should I use this medication? This medicine is for injection into a vein or under the skin. It is usuallygiven by a health care professional in a hospital or clinic setting. If you get this medicine at home, you will be taught how to prepare and give this medicine. Use exactly as directed. Take your medicine at regularintervals. Do not take your medicine more often than directed. It is important that you put your used needles and syringes in a special sharps container. Do not put them in a trash can. If you do not have a sharpscontainer, call your pharmacist or healthcare provider to get one. A special MedGuide will be given to you by the pharmacist with eachprescription and refill. Be sure to read this information carefully each time. Talk to your pediatrician regarding the use of this medicine in children. Whilethis drug may be prescribed for selected conditions, precautions do apply. Overdosage: If you think you have taken too much of this medicine contact apoison control center or emergency room at once. NOTE: This medicine is only for you. Do not share this medicine with others. What if I miss a dose? If you miss a dose, take it as soon as you can. If it is almost time for yournext dose, take only that dose. Do not take double or extra doses. What may interact with this medication? Interactions have not been studied. This list may not describe all possible interactions. Give your health care provider a list of   all the medicines, herbs, non-prescription drugs, or dietary supplements you use. Also tell them if you smoke, drink alcohol, or use illegaldrugs. Some items may interact with your medicine. What should I watch for while using this medication? Your condition will be monitored carefully while you are receiving thismedicine. You may need blood work done while you are taking this medicine. This medicine may cause a decrease in vitamin B6. You should make sure that you get enough vitamin B6  while you are taking this medicine. Discuss the foods youeat and the vitamins you take with your health care professional. What side effects may I notice from receiving this medication? Side effects that you should report to your doctor or health care professionalas soon as possible: allergic reactions like skin rash, itching or hives, swelling of the face, lips, or tongue seizures signs and symptoms of a blood clot such as breathing problems; changes in vision; chest pain; severe, sudden headache; pain, swelling, warmth in the leg; trouble speaking; sudden numbness or weakness of the face, arm or leg signs and symptoms of a stroke like changes in vision; confusion; trouble speaking or understanding; severe headaches; sudden numbness or weakness of the face, arm or leg; trouble walking; dizziness; loss of balance or coordination Side effects that usually do not require medical attention (report to yourdoctor or health care professional if they continue or are bothersome): chills cough dizziness fever headaches joint pain muscle cramps muscle pain nausea, vomiting pain, redness, or irritation at site where injected This list may not describe all possible side effects. Call your doctor for medical advice about side effects. You may report side effects to FDA at1-800-FDA-1088. Where should I keep my medication? Keep out of the reach of children. Store in a refrigerator between 2 and 8 degrees C (36 and 46 degrees F). Do not freeze or shake. Throw away any unused portion if using a single-dose vial. Multi-dose vials can be kept in the refrigerator for up to 21 days after theinitial dose. Throw away unused medicine. NOTE: This sheet is a summary. It may not cover all possible information. If you have questions about this medicine, talk to your doctor, pharmacist, orhealth care provider.  2022 Elsevier/Gold Standard (2016-12-11 08:35:19)  

## 2021-02-03 ENCOUNTER — Other Ambulatory Visit: Payer: Self-pay

## 2021-02-03 ENCOUNTER — Encounter: Payer: Self-pay | Admitting: Oncology

## 2021-02-03 ENCOUNTER — Inpatient Hospital Stay: Payer: Medicare PPO

## 2021-02-03 VITALS — BP 154/86 | HR 64 | Temp 97.5°F | Resp 18 | Ht 64.0 in | Wt 134.0 lb

## 2021-02-03 DIAGNOSIS — D631 Anemia in chronic kidney disease: Secondary | ICD-10-CM

## 2021-02-03 DIAGNOSIS — N189 Chronic kidney disease, unspecified: Secondary | ICD-10-CM | POA: Diagnosis not present

## 2021-02-03 DIAGNOSIS — Z23 Encounter for immunization: Secondary | ICD-10-CM | POA: Diagnosis not present

## 2021-02-03 DIAGNOSIS — D460 Refractory anemia without ring sideroblasts, so stated: Secondary | ICD-10-CM | POA: Diagnosis not present

## 2021-02-03 DIAGNOSIS — Z79899 Other long term (current) drug therapy: Secondary | ICD-10-CM | POA: Diagnosis not present

## 2021-02-03 MED ORDER — SODIUM CHLORIDE 0.9 % IV SOLN: Freq: Once | INTRAVENOUS | Status: AC

## 2021-02-03 MED ORDER — SODIUM CHLORIDE 0.9 % IV SOLN
510.0000 mg | Freq: Once | INTRAVENOUS | Status: AC
  Administered 2021-02-03: 510 mg via INTRAVENOUS
  Filled 2021-02-03: qty 17

## 2021-02-03 NOTE — Progress Notes (Signed)
1513: PT STABLE AT TIME OF DISCHARGE  

## 2021-02-03 NOTE — Patient Instructions (Addendum)
Ferumoxytol Injection What is this medication? FERUMOXYTOL (FER ue MOX i tol) treats low levels of iron in your body (iron deficiency anemia). Iron is a mineral that plays an important role in making red blood cells, which carry oxygen from your lungs to the rest of your body. This medicine may be used for other purposes; ask your health care provider or pharmacist if you have questions. COMMON BRAND NAME(S): Feraheme What should I tell my care team before I take this medication? They need to know if you have any of these conditions: Anemia not caused by low iron levels High levels of iron in the blood Magnetic resonance imaging (MRI) test scheduled An unusual or allergic reaction to iron, other medications, foods, dyes, or preservatives Pregnant or trying to get pregnant Breast-feeding How should I use this medication? This medication is for injection into a vein. It is given in a hospital or clinic setting. Talk to your care team the use of this medication in children. Special care may be needed. Overdosage: If you think you have taken too much of this medicine contact a poison control center or emergency room at once. NOTE: This medicine is only for you. Do not share this medicine with others. What if I miss a dose? It is important not to miss your dose. Call your care team if you are unable to keep an appointment. What may interact with this medication? Other iron products This list may not describe all possible interactions. Give your health care provider a list of all the medicines, herbs, non-prescription drugs, or dietary supplements you use. Also tell them if you smoke, drink alcohol, or use illegal drugs. Some items may interact with your medicine. What should I watch for while using this medication? Visit your care team regularly. Tell your care team if your symptoms do not start to get better or if they get worse. You may need blood work done while you are taking this  medication. You may need to follow a special diet. Talk to your care team. Foods that contain iron include: whole grains/cereals, dried fruits, beans, or peas, leafy green vegetables, and organ meats (liver, kidney). What side effects may I notice from receiving this medication? Side effects that you should report to your care team as soon as possible: Allergic reactions-skin rash, itching, hives, swelling of the face, lips, tongue, or throat Low blood pressure-dizziness, feeling faint or lightheaded, blurry vision Shortness of breath Side effects that usually do not require medical attention (report to your care team if they continue or are bothersome): Flushing Headache Joint pain Muscle pain Nausea Pain, redness, or irritation at injection site This list may not describe all possible side effects. Call your doctor for medical advice about side effects. You may report side effects to FDA at 1-800-FDA-1088. Where should I keep my medication? This medication is given in a hospital or clinic and will not be stored at home. NOTE: This sheet is a summary. It may not cover all possible information. If you have questions about this medicine, talk to your doctor, pharmacist, or health care provider.  2022 Elsevier/Gold Standard (2020-09-20 15:35:12)  

## 2021-02-07 ENCOUNTER — Inpatient Hospital Stay: Payer: Medicare PPO

## 2021-02-07 ENCOUNTER — Other Ambulatory Visit: Payer: Self-pay

## 2021-02-07 VITALS — BP 144/91 | HR 71 | Temp 97.8°F | Resp 18 | Ht 64.0 in | Wt 139.0 lb

## 2021-02-07 DIAGNOSIS — D460 Refractory anemia without ring sideroblasts, so stated: Secondary | ICD-10-CM | POA: Diagnosis not present

## 2021-02-07 DIAGNOSIS — D631 Anemia in chronic kidney disease: Secondary | ICD-10-CM

## 2021-02-07 DIAGNOSIS — Z23 Encounter for immunization: Secondary | ICD-10-CM | POA: Diagnosis not present

## 2021-02-07 DIAGNOSIS — Z79899 Other long term (current) drug therapy: Secondary | ICD-10-CM | POA: Diagnosis not present

## 2021-02-07 DIAGNOSIS — N189 Chronic kidney disease, unspecified: Secondary | ICD-10-CM | POA: Diagnosis not present

## 2021-02-07 MED ORDER — EPINEPHRINE 0.3 MG/0.3ML IJ SOAJ: 0.3000 mg | Freq: Once | INTRAMUSCULAR | Status: DC | PRN

## 2021-02-07 MED ORDER — INFLUENZA VAC SPLIT QUAD 0.5 ML IM SUSY
0.5000 mL | PREFILLED_SYRINGE | Freq: Once | INTRAMUSCULAR | Status: AC
  Administered 2021-02-07: 0.5 mL via INTRAMUSCULAR
  Filled 2021-02-07: qty 0.5

## 2021-02-07 MED ORDER — EPOETIN ALFA-EPBX 40000 UNIT/ML IJ SOLN
40000.0000 [IU] | Freq: Once | INTRAMUSCULAR | Status: AC
  Administered 2021-02-07: 40000 [IU] via SUBCUTANEOUS
  Filled 2021-02-07: qty 1

## 2021-02-07 MED ORDER — SODIUM CHLORIDE 0.9 % IV SOLN: Freq: Once | INTRAVENOUS | Status: DC | PRN

## 2021-02-07 MED ORDER — SODIUM CHLORIDE 0.9% FLUSH: 3.0000 mL | Freq: Once | INTRAVENOUS | Status: DC | PRN

## 2021-02-07 MED ORDER — SODIUM CHLORIDE 0.9% FLUSH: 10.0000 mL | Freq: Once | INTRAVENOUS | Status: DC | PRN

## 2021-02-07 MED ORDER — ALTEPLASE 2 MG IJ SOLR: 2.0000 mg | Freq: Once | INTRAMUSCULAR | Status: DC | PRN

## 2021-02-07 MED ORDER — METHYLPREDNISOLONE SODIUM SUCC 125 MG IJ SOLR: 125.0000 mg | Freq: Once | INTRAMUSCULAR | Status: DC | PRN

## 2021-02-07 MED ORDER — HEPARIN SOD (PORK) LOCK FLUSH 100 UNIT/ML IV SOLN: 500.0000 [IU] | Freq: Once | INTRAVENOUS | Status: DC | PRN

## 2021-02-07 MED ORDER — FILGRASTIM-SNDZ 480 MCG/0.8ML IJ SOSY
480.0000 ug | PREFILLED_SYRINGE | Freq: Once | INTRAMUSCULAR | Status: AC
  Administered 2021-02-07: 480 ug via SUBCUTANEOUS
  Filled 2021-02-07: qty 0.8

## 2021-02-07 MED ORDER — DIPHENHYDRAMINE HCL 50 MG/ML IJ SOLN: 50.0000 mg | Freq: Once | INTRAMUSCULAR | Status: DC | PRN

## 2021-02-07 MED ORDER — HEPARIN SOD (PORK) LOCK FLUSH 100 UNIT/ML IV SOLN: 250.0000 [IU] | Freq: Once | INTRAVENOUS | Status: DC | PRN

## 2021-02-07 MED ORDER — ALBUTEROL SULFATE (2.5 MG/3ML) 0.083% IN NEBU: 2.5000 mg | INHALATION_SOLUTION | Freq: Once | RESPIRATORY_TRACT | Status: DC | PRN

## 2021-02-07 MED ORDER — FAMOTIDINE IN NACL 20-0.9 MG/50ML-% IV SOLN: 20.0000 mg | Freq: Once | INTRAVENOUS | Status: DC | PRN

## 2021-02-07 NOTE — Progress Notes (Signed)
1454:PT STABLE AT TIME OF DISCHARGE  

## 2021-02-12 ENCOUNTER — Other Ambulatory Visit: Payer: Self-pay

## 2021-02-12 ENCOUNTER — Encounter: Payer: Self-pay | Admitting: Sports Medicine

## 2021-02-12 ENCOUNTER — Ambulatory Visit (INDEPENDENT_AMBULATORY_CARE_PROVIDER_SITE_OTHER): Payer: Medicare PPO | Admitting: Sports Medicine

## 2021-02-12 DIAGNOSIS — M79675 Pain in left toe(s): Secondary | ICD-10-CM

## 2021-02-12 DIAGNOSIS — L97521 Non-pressure chronic ulcer of other part of left foot limited to breakdown of skin: Secondary | ICD-10-CM

## 2021-02-12 DIAGNOSIS — E1140 Type 2 diabetes mellitus with diabetic neuropathy, unspecified: Secondary | ICD-10-CM

## 2021-02-12 DIAGNOSIS — B351 Tinea unguium: Secondary | ICD-10-CM

## 2021-02-12 DIAGNOSIS — L97511 Non-pressure chronic ulcer of other part of right foot limited to breakdown of skin: Secondary | ICD-10-CM

## 2021-02-12 DIAGNOSIS — M79674 Pain in right toe(s): Secondary | ICD-10-CM

## 2021-02-12 DIAGNOSIS — L84 Corns and callosities: Secondary | ICD-10-CM
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
## 2021-02-13 ENCOUNTER — Telehealth: Payer: Self-pay

## 2021-02-13 NOTE — Telephone Encounter (Signed)
SEE TELEPHONE NOTE FOR 02/13/21.

## 2021-02-14 ENCOUNTER — Inpatient Hospital Stay: Payer: Medicare PPO

## 2021-02-14 ENCOUNTER — Other Ambulatory Visit: Payer: Self-pay

## 2021-02-14 VITALS — BP 162/82 | HR 69 | Temp 97.7°F | Resp 18 | Ht 64.0 in | Wt 132.2 lb

## 2021-02-14 DIAGNOSIS — Z79899 Other long term (current) drug therapy: Secondary | ICD-10-CM | POA: Diagnosis not present

## 2021-02-14 DIAGNOSIS — N184 Chronic kidney disease, stage 4 (severe): Secondary | ICD-10-CM

## 2021-02-14 DIAGNOSIS — D649 Anemia, unspecified: Secondary | ICD-10-CM

## 2021-02-14 DIAGNOSIS — N189 Chronic kidney disease, unspecified: Secondary | ICD-10-CM | POA: Diagnosis not present

## 2021-02-14 DIAGNOSIS — D631 Anemia in chronic kidney disease: Secondary | ICD-10-CM | POA: Diagnosis not present

## 2021-02-14 DIAGNOSIS — D460 Refractory anemia without ring sideroblasts, so stated: Secondary | ICD-10-CM | POA: Diagnosis not present

## 2021-02-14 DIAGNOSIS — Z23 Encounter for immunization: Secondary | ICD-10-CM | POA: Diagnosis not present

## 2021-02-14 MED ORDER — FILGRASTIM-SNDZ 480 MCG/0.8ML IJ SOSY
480.0000 ug | PREFILLED_SYRINGE | Freq: Once | INTRAMUSCULAR | Status: AC
Start: 2021-02-14 — End: 2021-02-14
  Administered 2021-02-14: 480 ug via SUBCUTANEOUS
  Filled 2021-02-14: qty 0.8

## 2021-02-14 MED ORDER — EPOETIN ALFA-EPBX 40000 UNIT/ML IJ SOLN: 40000.0000 [IU] | Freq: Once | INTRAMUSCULAR | Status: DC

## 2021-02-14 NOTE — Patient Instructions (Signed)
Filgrastim, G-CSF injection What is this medication? FILGRASTIM, G-CSF (fil GRA stim) is a granulocyte colony-stimulating factor that stimulates the growth of neutrophils, a type of white blood cell (WBC) important in the body's fight against infection. It is used to reduce the incidence of fever and infection in patients with certain types of cancer who are receiving chemotherapy that affects the bone marrow, to stimulate blood cell production for removal of WBCs from the body prior to a bone marrow transplantation, to reduce the incidence of fever and infection in patients who have severe chronic neutropenia, and to improve survival outcomes following high-dose radiation exposure that is toxic to the bone marrow. This medicine may be used for other purposes; ask your health care provider or pharmacist if you have questions. COMMON BRAND NAME(S): Neupogen, Nivestym, Releuko, Zarxio What should I tell my care team before I take this medication? They need to know if you have any of these conditions: kidney disease latex allergy ongoing radiation therapy sickle cell disease an unusual or allergic reaction to filgrastim, pegfilgrastim, other medicines, foods, dyes, or preservatives pregnant or trying to get pregnant breast-feeding How should I use this medication? This medicine is for injection under the skin or infusion into a vein. As an infusion into a vein, it is usually given by a health care professional in a hospital or clinic setting. If you get this medicine at home, you will be taught how to prepare and give this medicine. Refer to the Instructions for Use that come with your medication packaging. Use exactly as directed. Take your medicine at regular intervals. Do not take your medicine more often than directed. It is important that you put your used needles and syringes in a special sharps container. Do not put them in a trash can. If you do not have a sharps container, call your pharmacist  or healthcare provider to get one. Talk to your pediatrician regarding the use of this medicine in children. While this drug may be prescribed for children as young as 7 months for selected conditions, precautions do apply. Overdosage: If you think you have taken too much of this medicine contact a poison control center or emergency room at once. NOTE: This medicine is only for you. Do not share this medicine with others. What if I miss a dose? It is important not to miss your dose. Call your doctor or health care professional if you miss a dose. What may interact with this medication? This medicine may interact with the following medications: medicines that may cause a release of neutrophils, such as lithium This list may not describe all possible interactions. Give your health care provider a list of all the medicines, herbs, non-prescription drugs, or dietary supplements you use. Also tell them if you smoke, drink alcohol, or use illegal drugs. Some items may interact with your medicine. What should I watch for while using this medication? Your condition will be monitored carefully while you are receiving this medicine. You may need blood work done while you are taking this medicine. Talk to your health care provider about your risk of cancer. You may be more at risk for certain types of cancer if you take this medicine. What side effects may I notice from receiving this medication? Side effects that you should report to your doctor or health care professional as soon as possible: allergic reactions like skin rash, itching or hives, swelling of the face, lips, or tongue back pain dizziness or feeling faint fever pain, redness, or   irritation at site where injected pinpoint red spots on the skin shortness of breath or breathing problems signs and symptoms of kidney injury like trouble passing urine, change in the amount of urine, or red or dark-brown urine stomach or side pain, or pain at  the shoulder swelling tiredness unusual bleeding or bruising Side effects that usually do not require medical attention (report to your doctor or health care professional if they continue or are bothersome): bone pain cough diarrhea hair loss headache muscle pain This list may not describe all possible side effects. Call your doctor for medical advice about side effects. You may report side effects to FDA at 1-800-FDA-1088. Where should I keep my medication? Keep out of the reach of children. Store in a refrigerator between 2 and 8 degrees C (36 and 46 degrees F). Do not freeze. Keep in carton to protect from light. Throw away this medicine if vials or syringes are left out of the refrigerator for more than 24 hours. Throw away any unused medicine after the expiration date. NOTE: This sheet is a summary. It may not cover all possible information. If you have questions about this medicine, talk to your doctor, pharmacist, or health care provider.  2022 Elsevier/Gold Standard (2019-05-25 18:47:55)  

## 2021-02-14 NOTE — Progress Notes (Signed)
1145:PT STABLE AT TIME OF DISCHARGE  

## 2021-02-21 ENCOUNTER — Ambulatory Visit: Payer: Medicare PPO

## 2021-02-21 ENCOUNTER — Inpatient Hospital Stay: Payer: Medicare PPO

## 2021-02-21 ENCOUNTER — Other Ambulatory Visit: Payer: Self-pay

## 2021-02-21 ENCOUNTER — Other Ambulatory Visit: Payer: Self-pay | Admitting: Hematology and Oncology

## 2021-02-21 ENCOUNTER — Other Ambulatory Visit: Payer: Medicare PPO

## 2021-02-21 ENCOUNTER — Inpatient Hospital Stay: Payer: Medicare PPO | Attending: Oncology

## 2021-02-21 DIAGNOSIS — D631 Anemia in chronic kidney disease: Secondary | ICD-10-CM | POA: Insufficient documentation

## 2021-02-21 DIAGNOSIS — D649 Anemia, unspecified: Secondary | ICD-10-CM

## 2021-02-21 DIAGNOSIS — Z79899 Other long term (current) drug therapy: Secondary | ICD-10-CM | POA: Insufficient documentation

## 2021-02-21 DIAGNOSIS — D460 Refractory anemia without ring sideroblasts, so stated: Secondary | ICD-10-CM | POA: Insufficient documentation

## 2021-02-21 DIAGNOSIS — N189 Chronic kidney disease, unspecified: Secondary | ICD-10-CM | POA: Insufficient documentation

## 2021-02-21 LAB — CBC AND DIFFERENTIAL
HCT: 37 (ref 36–46)
Hemoglobin: 11.4 — AB (ref 12.0–16.0)
Neutrophils Absolute: 2.84
Platelets: 219 (ref 150–399)
WBC: 4.9

## 2021-02-21 LAB — CBC: RBC: 3.84 — AB (ref 3.87–5.11)

## 2021-02-21 NOTE — Addendum Note (Signed)
Addended by: Juanetta Beets on: 02/21/2021 09:56 AM   Modules accepted: Orders

## 2021-02-27 ENCOUNTER — Inpatient Hospital Stay: Payer: Medicare PPO

## 2021-02-27 ENCOUNTER — Other Ambulatory Visit: Payer: Self-pay | Admitting: Hematology and Oncology

## 2021-02-27 DIAGNOSIS — D649 Anemia, unspecified: Secondary | ICD-10-CM

## 2021-02-27 DIAGNOSIS — D469 Myelodysplastic syndrome, unspecified: Secondary | ICD-10-CM | POA: Diagnosis not present

## 2021-02-27 LAB — CBC AND DIFFERENTIAL
HCT: 36 (ref 36–46)
Hemoglobin: 11.1 — AB (ref 12.0–16.0)
Neutrophils Absolute: 3.35
Platelets: 193 (ref 150–399)
WBC: 5

## 2021-02-27 LAB — CBC
MCV: 93 (ref 80–99)
RBC: 3.83 — AB (ref 3.87–5.11)

## 2021-02-28 ENCOUNTER — Inpatient Hospital Stay: Payer: Medicare PPO

## 2021-03-07 ENCOUNTER — Ambulatory Visit: Payer: Medicare PPO

## 2021-03-07 NOTE — Progress Notes (Signed)
[facility]  [address] [place],  [place]  [address] [phone]  Clinic Day:  03/13/2021  Referring physician: Nicoletta Dress, MD  This document serves as a record of services personally performed by [name], MD. It was created on their behalf by [facility] E, a trained medical scribe. The creation of this record is based on the scribe's personal observations and the provider's statements to them.  HISTORY OF PRESENT ILLNESS:  The patient is an 87 y.o. female with anemia secondary to chronic renal insufficiency and myelodysplasia.  She was receiving white and red cell shot therapy on a weekly basis, which led to an improvement in his hemoglobin.  These shots have been held over the past few weeks as her hemoglobin had risen above 11.  She comes in today for repeat clinical assessment.  Since her last visit, the patient has been doing well.  She denies having increased weakness or any overt forms of blood loss which concern her for progressive anemia.    PHYSICAL EXAM:  Blood pressure (!) 160/69, pulse 70, temperature 97.6 F (36.4 C), resp. rate 16, height 5\' 4"  (1.626 m), weight 139 lb 12.8 oz (63.4 kg), SpO2 99 %. Wt Readings from Last 3 Encounters:  03/13/21 139 lb 12.8 oz (63.4 kg)  02/14/21 132 lb 4 oz (60 kg)  02/07/21 139 lb (63 kg)   Body mass index is 24 kg/m. Performance status (ECOG): 2 Physical Exam Constitutional:      Appearance: Normal appearance. She is not ill-appearing.  HENT:     Mouth/Throat:     Mouth: Mucous membranes are moist.     Pharynx: Oropharynx is clear. No oropharyngeal exudate or posterior oropharyngeal erythema.  Cardiovascular:     Rate and Rhythm: Normal rate and regular rhythm.     Heart sounds: No murmur heard.   No friction rub. No gallop.  Pulmonary:     Effort: Pulmonary effort is normal. No respiratory distress.     Breath sounds: Normal breath sounds. No wheezing, rhonchi or rales.   Abdominal:     General: Bowel sounds are normal. There is no distension.     Palpations: Abdomen is soft. There is no mass.     Tenderness: There is no abdominal tenderness.  Musculoskeletal:        General: No swelling.     Right lower leg: No edema.     Left lower leg: No edema.  Lymphadenopathy:     Cervical: No cervical adenopathy.     Upper Body:     Right upper body: No supraclavicular or axillary adenopathy.     Left upper body: No supraclavicular or axillary adenopathy.     Lower Body: No right inguinal adenopathy. No left inguinal adenopathy.  Skin:    General: Skin is warm.     Coloration: Skin is not jaundiced.     Findings: No lesion or rash.  Neurological:     General: No focal deficit present.     Mental Status: She is alert and oriented to person, place, and time. Mental status is at baseline.  Psychiatric:        Mood and Affect: Mood normal.        Behavior: Behavior normal.        Thought Content: Thought content normal.    LABS:   CBC Latest Ref Rng & Units 03/13/2021 02/27/2021 02/21/2021  WBC - 4.9 5.0 4.9  Hemoglobin 12.0 - 16.0 9.6(A)  11.1(A) 11.4(A)  Hematocrit 36 - 46 30(A) 36 37  Platelets 150 - 399 190 193 219   CMP Latest Ref Rng & Units 11/22/2020 10/17/2020 08/13/2020  Glucose 65 - 99 mg/dL - - -  BUN 4 - 21 41(A) 45(A) 42(A)  Creatinine 0.5 - 1.1 1.8(A) 1.9(A) 2.0(A)  Sodium 137 - 147 134(A) 137 136(A)  Potassium 3.4 - 5.3 4.5 4.3 4.2  Chloride 99 - 108 101 105 104  CO2 13 - 22 24(A) 27(A) 26(A)  Calcium 8.7 - 10.7 8.3(A) 8.2(A) 8.3(A)  Total Protein g/dL 6.1 - -  Total Bilirubin 0.0 - 1.2 mg/dL - - -  Alkaline Phos 25 - 125 103 122 120  AST 13 - 35 27 28 25   ALT 7 - 35 14 15 14     ASSESSMENT & PLAN:  Assessment/Plan:  An 87 y.o. female whose anemia appears to be due to both renal insufficiency and myelodysplasia.  As her hemoglobin has fallen below 10 again, her concurrent Retacrit and Zarxio shots will be restarted tomorrow, but they  will be given every 2 weeks.  The goal is to get her hemoglobin back to/above 10.  Her CBC will be checked every 4 weeks to see how well she is responding to her shots.  I will see her back in 12 weeks for repeat clinical assessment.  The patient understands all the plans discussed today and is in agreement with them.     I, Rita Ohara, am acting as scribe for [name], MD    I have reviewed this report as typed by the medical scribe, and it is complete and accurate.  [name], MD

## 2021-03-11 ENCOUNTER — Encounter: Payer: Self-pay | Admitting: Oncology

## 2021-03-11 NOTE — Addendum Note (Signed)
Addended by: Juanetta Beets on: 03/11/2021 02:48 PM   Modules accepted: Orders

## 2021-03-13 ENCOUNTER — Encounter: Payer: Self-pay | Admitting: Oncology

## 2021-03-13 ENCOUNTER — Inpatient Hospital Stay (INDEPENDENT_AMBULATORY_CARE_PROVIDER_SITE_OTHER): Payer: Medicare PPO | Admitting: Oncology

## 2021-03-13 ENCOUNTER — Inpatient Hospital Stay: Payer: Medicare PPO

## 2021-03-13 ENCOUNTER — Telehealth: Payer: Self-pay | Admitting: Oncology

## 2021-03-13 VITALS — BP 160/69 | HR 70 | Temp 97.6°F | Resp 16 | Ht 64.0 in | Wt 139.8 lb

## 2021-03-13 DIAGNOSIS — D649 Anemia, unspecified: Secondary | ICD-10-CM

## 2021-03-13 DIAGNOSIS — D469 Myelodysplastic syndrome, unspecified: Secondary | ICD-10-CM | POA: Diagnosis not present

## 2021-03-13 LAB — CBC AND DIFFERENTIAL
HCT: 30 — AB (ref 36–46)
Hemoglobin: 9.6 — AB (ref 12.0–16.0)
Neutrophils Absolute: 3.14
Platelets: 190 (ref 150–399)
WBC: 4.9

## 2021-03-13 LAB — CBC: RBC: 3.24 — AB (ref 3.87–5.11)

## 2021-03-13 NOTE — Addendum Note (Signed)
Addended by: Juanetta Beets on: 03/13/2021 03:04 PM   Modules accepted: Orders

## 2021-03-13 NOTE — Telephone Encounter (Signed)
Per 10/27 los next appt scheduled and given to patient 

## 2021-03-14 ENCOUNTER — Inpatient Hospital Stay: Payer: Medicare PPO

## 2021-03-14 ENCOUNTER — Other Ambulatory Visit: Payer: Self-pay

## 2021-03-14 VITALS — BP 137/58 | HR 80 | Temp 97.7°F | Resp 16 | Ht 64.0 in | Wt 135.5 lb

## 2021-03-14 DIAGNOSIS — N184 Chronic kidney disease, stage 4 (severe): Secondary | ICD-10-CM

## 2021-03-14 DIAGNOSIS — N189 Chronic kidney disease, unspecified: Secondary | ICD-10-CM | POA: Diagnosis not present

## 2021-03-14 DIAGNOSIS — Z79899 Other long term (current) drug therapy: Secondary | ICD-10-CM | POA: Diagnosis not present

## 2021-03-14 DIAGNOSIS — D460 Refractory anemia without ring sideroblasts, so stated: Secondary | ICD-10-CM | POA: Diagnosis not present

## 2021-03-14 DIAGNOSIS — D631 Anemia in chronic kidney disease: Secondary | ICD-10-CM

## 2021-03-14 MED ORDER — EPOETIN ALFA-EPBX 40000 UNIT/ML IJ SOLN
40000.0000 [IU] | Freq: Once | INTRAMUSCULAR | Status: AC
  Administered 2021-03-14: 40000 [IU] via SUBCUTANEOUS
  Filled 2021-03-14: qty 1

## 2021-03-14 MED ORDER — FILGRASTIM-SNDZ 480 MCG/0.8ML IJ SOSY
480.0000 ug | PREFILLED_SYRINGE | Freq: Once | INTRAMUSCULAR | Status: AC
  Administered 2021-03-14: 480 ug via SUBCUTANEOUS
  Filled 2021-03-14: qty 0.8

## 2021-03-14 NOTE — Progress Notes (Signed)
Discharged home, stable  

## 2021-03-21 ENCOUNTER — Ambulatory Visit: Payer: Medicare PPO

## 2021-03-28 ENCOUNTER — Other Ambulatory Visit: Payer: Self-pay

## 2021-03-28 ENCOUNTER — Inpatient Hospital Stay: Payer: Medicare PPO | Attending: Oncology

## 2021-03-28 VITALS — BP 177/78 | HR 75 | Temp 97.5°F | Resp 16 | Ht 64.0 in | Wt 142.5 lb

## 2021-03-28 DIAGNOSIS — N189 Chronic kidney disease, unspecified: Secondary | ICD-10-CM | POA: Insufficient documentation

## 2021-03-28 DIAGNOSIS — D631 Anemia in chronic kidney disease: Secondary | ICD-10-CM | POA: Diagnosis not present

## 2021-03-28 DIAGNOSIS — D460 Refractory anemia without ring sideroblasts, so stated: Secondary | ICD-10-CM | POA: Diagnosis not present

## 2021-03-28 DIAGNOSIS — Z79899 Other long term (current) drug therapy: Secondary | ICD-10-CM | POA: Diagnosis not present

## 2021-03-28 DIAGNOSIS — N184 Chronic kidney disease, stage 4 (severe): Secondary | ICD-10-CM | POA: Insufficient documentation

## 2021-03-28 MED ORDER — EPOETIN ALFA-EPBX 40000 UNIT/ML IJ SOLN
40000.0000 [IU] | Freq: Once | INTRAMUSCULAR | Status: AC
  Administered 2021-03-28: 40000 [IU] via SUBCUTANEOUS
  Filled 2021-03-28: qty 1

## 2021-03-28 MED ORDER — FILGRASTIM-SNDZ 480 MCG/0.8ML IJ SOSY
480.0000 ug | PREFILLED_SYRINGE | Freq: Once | INTRAMUSCULAR | Status: AC
  Administered 2021-03-28: 480 ug via SUBCUTANEOUS
  Filled 2021-03-28: qty 0.8

## 2021-03-28 NOTE — Progress Notes (Signed)
1425: PT STABLE AT TIME OF DISCHARGE  

## 2021-03-28 NOTE — Patient Instructions (Signed)
Epoetin Alfa injection What is this medication? EPOETIN ALFA (e POE e tin AL fa) helps your body make more red blood cells. This medicine is used to treat anemia caused by chronic kidney disease, cancer chemotherapy, or HIV-therapy. It may also be used before surgery if you have anemia. This medicine may be used for other purposes; ask your health care provider or pharmacist if you have questions. COMMON BRAND NAME(S): Epogen, Procrit, Retacrit What should I tell my care team before I take this medication? They need to know if you have any of these conditions: cancer heart disease high blood pressure history of blood clots history of stroke low levels of folate, iron, or vitamin B12 in the blood seizures an unusual or allergic reaction to erythropoietin, albumin, benzyl alcohol, hamster proteins, other medicines, foods, dyes, or preservatives pregnant or trying to get pregnant breast-feeding How should I use this medication? This medicine is for injection into a vein or under the skin. It is usually given by a health care professional in a hospital or clinic setting. If you get this medicine at home, you will be taught how to prepare and give this medicine. Use exactly as directed. Take your medicine at regular intervals. Do not take your medicine more often than directed. It is important that you put your used needles and syringes in a special sharps container. Do not put them in a trash can. If you do not have a sharps container, call your pharmacist or healthcare provider to get one. A special MedGuide will be given to you by the pharmacist with each prescription and refill. Be sure to read this information carefully each time. Talk to your pediatrician regarding the use of this medicine in children. While this drug may be prescribed for selected conditions, precautions do apply. Overdosage: If you think you have taken too much of this medicine contact a poison control center or emergency  room at once. NOTE: This medicine is only for you. Do not share this medicine with others. What if I miss a dose? If you miss a dose, take it as soon as you can. If it is almost time for your next dose, take only that dose. Do not take double or extra doses. What may interact with this medication? Interactions have not been studied. This list may not describe all possible interactions. Give your health care provider a list of all the medicines, herbs, non-prescription drugs, or dietary supplements you use. Also tell them if you smoke, drink alcohol, or use illegal drugs. Some items may interact with your medicine. What should I watch for while using this medication? Your condition will be monitored carefully while you are receiving this medicine. You may need blood work done while you are taking this medicine. This medicine may cause a decrease in vitamin B6. You should make sure that you get enough vitamin B6 while you are taking this medicine. Discuss the foods you eat and the vitamins you take with your health care professional. What side effects may I notice from receiving this medication? Side effects that you should report to your doctor or health care professional as soon as possible: allergic reactions like skin rash, itching or hives, swelling of the face, lips, or tongue seizures signs and symptoms of a blood clot such as breathing problems; changes in vision; chest pain; severe, sudden headache; pain, swelling, warmth in the leg; trouble speaking; sudden numbness or weakness of the face, arm or leg signs and symptoms of a stroke like   changes in vision; confusion; trouble speaking or understanding; severe headaches; sudden numbness or weakness of the face, arm or leg; trouble walking; dizziness; loss of balance or coordination Side effects that usually do not require medical attention (report to your doctor or health care professional if they continue or are  bothersome): chills cough dizziness fever headaches joint pain muscle cramps muscle pain nausea, vomiting pain, redness, or irritation at site where injected This list may not describe all possible side effects. Call your doctor for medical advice about side effects. You may report side effects to FDA at 1-800-FDA-1088. Where should I keep my medication? Keep out of the reach of children. Store in a refrigerator between 2 and 8 degrees C (36 and 46 degrees F). Do not freeze or shake. Throw away any unused portion if using a single-dose vial. Multi-dose vials can be kept in the refrigerator for up to 21 days after the initial dose. Throw away unused medicine. NOTE: This sheet is a summary. It may not cover all possible information. If you have questions about this medicine, talk to your doctor, pharmacist, or health care provider.  2022 Elsevier/Gold Standard (2017-01-05 00:00:00) Filgrastim, G-CSF injection What is this medication? FILGRASTIM, G-CSF (fil GRA stim) is a granulocyte colony-stimulating factor that stimulates the growth of neutrophils, a type of white blood cell (WBC) important in the body's fight against infection. It is used to reduce the incidence of fever and infection in patients with certain types of cancer who are receiving chemotherapy that affects the bone marrow, to stimulate blood cell production for removal of WBCs from the body prior to a bone marrow transplantation, to reduce the incidence of fever and infection in patients who have severe chronic neutropenia, and to improve survival outcomes following high-dose radiation exposure that is toxic to the bone marrow. This medicine may be used for other purposes; ask your health care provider or pharmacist if you have questions. COMMON BRAND NAME(S): Neupogen, Nivestym, Releuko, Zarxio What should I tell my care team before I take this medication? They need to know if you have any of these conditions: kidney  disease latex allergy ongoing radiation therapy sickle cell disease an unusual or allergic reaction to filgrastim, pegfilgrastim, other medicines, foods, dyes, or preservatives pregnant or trying to get pregnant breast-feeding How should I use this medication? This medicine is for injection under the skin or infusion into a vein. As an infusion into a vein, it is usually given by a health care professional in a hospital or clinic setting. If you get this medicine at home, you will be taught how to prepare and give this medicine. Refer to the Instructions for Use that come with your medication packaging. Use exactly as directed. Take your medicine at regular intervals. Do not take your medicine more often than directed. It is important that you put your used needles and syringes in a special sharps container. Do not put them in a trash can. If you do not have a sharps container, call your pharmacist or healthcare provider to get one. Talk to your pediatrician regarding the use of this medicine in children. While this drug may be prescribed for children as young as 7 months for selected conditions, precautions do apply. Overdosage: If you think you have taken too much of this medicine contact a poison control center or emergency room at once. NOTE: This medicine is only for you. Do not share this medicine with others. What if I miss a dose? It is important not  to miss your dose. Call your doctor or health care professional if you miss a dose. What may interact with this medication? This medicine may interact with the following medications: medicines that may cause a release of neutrophils, such as lithium This list may not describe all possible interactions. Give your health care provider a list of all the medicines, herbs, non-prescription drugs, or dietary supplements you use. Also tell them if you smoke, drink alcohol, or use illegal drugs. Some items may interact with your medicine. What should  I watch for while using this medication? Your condition will be monitored carefully while you are receiving this medicine. You may need blood work done while you are taking this medicine. Talk to your health care provider about your risk of cancer. You may be more at risk for certain types of cancer if you take this medicine. What side effects may I notice from receiving this medication? Side effects that you should report to your doctor or health care professional as soon as possible: allergic reactions like skin rash, itching or hives, swelling of the face, lips, or tongue back pain dizziness or feeling faint fever pain, redness, or irritation at site where injected pinpoint red spots on the skin shortness of breath or breathing problems signs and symptoms of kidney injury like trouble passing urine, change in the amount of urine, or red or dark-brown urine stomach or side pain, or pain at the shoulder swelling tiredness unusual bleeding or bruising Side effects that usually do not require medical attention (report to your doctor or health care professional if they continue or are bothersome): bone pain cough diarrhea hair loss headache muscle pain This list may not describe all possible side effects. Call your doctor for medical advice about side effects. You may report side effects to FDA at 1-800-FDA-1088. Where should I keep my medication? Keep out of the reach of children. Store in a refrigerator between 2 and 8 degrees C (36 and 46 degrees F). Do not freeze. Keep in carton to protect from light. Throw away this medicine if vials or syringes are left out of the refrigerator for more than 24 hours. Throw away any unused medicine after the expiration date. NOTE: This sheet is a summary. It may not cover all possible information. If you have questions about this medicine, talk to your doctor, pharmacist, or health care provider.  2022 Elsevier/Gold Standard (2021-01-21  00:00:00)

## 2021-04-03 DIAGNOSIS — D638 Anemia in other chronic diseases classified elsewhere: Secondary | ICD-10-CM | POA: Diagnosis not present

## 2021-04-03 DIAGNOSIS — E785 Hyperlipidemia, unspecified: Secondary | ICD-10-CM | POA: Diagnosis not present

## 2021-04-03 DIAGNOSIS — N184 Chronic kidney disease, stage 4 (severe): Secondary | ICD-10-CM | POA: Diagnosis not present

## 2021-04-03 DIAGNOSIS — E1142 Type 2 diabetes mellitus with diabetic polyneuropathy: Secondary | ICD-10-CM | POA: Diagnosis not present

## 2021-04-03 DIAGNOSIS — E039 Hypothyroidism, unspecified: Secondary | ICD-10-CM | POA: Diagnosis not present

## 2021-04-03 DIAGNOSIS — I5040 Unspecified combined systolic (congestive) and diastolic (congestive) heart failure: Secondary | ICD-10-CM | POA: Diagnosis not present

## 2021-04-03 DIAGNOSIS — I110 Hypertensive heart disease with heart failure: Secondary | ICD-10-CM | POA: Diagnosis not present

## 2021-04-03 DIAGNOSIS — I482 Chronic atrial fibrillation, unspecified: Secondary | ICD-10-CM | POA: Diagnosis not present

## 2021-04-04 ENCOUNTER — Ambulatory Visit: Payer: Medicare PPO

## 2021-04-07 ENCOUNTER — Encounter: Payer: Self-pay | Admitting: Oncology

## 2021-04-07 NOTE — Addendum Note (Signed)
Addended by: Juanetta Beets on: 04/07/2021 03:34 PM   Modules accepted: Orders

## 2021-04-09 ENCOUNTER — Inpatient Hospital Stay: Payer: Medicare PPO

## 2021-04-09 ENCOUNTER — Other Ambulatory Visit: Payer: Self-pay | Admitting: Hematology and Oncology

## 2021-04-09 ENCOUNTER — Other Ambulatory Visit: Payer: Self-pay

## 2021-04-09 DIAGNOSIS — D469 Myelodysplastic syndrome, unspecified: Secondary | ICD-10-CM | POA: Diagnosis not present

## 2021-04-09 DIAGNOSIS — D649 Anemia, unspecified: Secondary | ICD-10-CM

## 2021-04-09 LAB — CBC AND DIFFERENTIAL
HCT: 27 — AB (ref 36–46)
Hemoglobin: 8.5 — AB (ref 12.0–16.0)
Neutrophils Absolute: 4.97
Platelets: 238 (ref 150–399)
WBC: 7.1

## 2021-04-09 LAB — CBC: RBC: 2.75 — AB (ref 3.87–5.11)

## 2021-04-14 ENCOUNTER — Inpatient Hospital Stay: Payer: Medicare PPO

## 2021-04-14 ENCOUNTER — Other Ambulatory Visit: Payer: Self-pay

## 2021-04-14 VITALS — BP 140/66 | HR 64 | Temp 97.7°F | Resp 18 | Ht 64.0 in | Wt 145.2 lb

## 2021-04-14 DIAGNOSIS — N184 Chronic kidney disease, stage 4 (severe): Secondary | ICD-10-CM

## 2021-04-14 DIAGNOSIS — D460 Refractory anemia without ring sideroblasts, so stated: Secondary | ICD-10-CM | POA: Diagnosis not present

## 2021-04-14 DIAGNOSIS — N189 Chronic kidney disease, unspecified: Secondary | ICD-10-CM | POA: Diagnosis not present

## 2021-04-14 DIAGNOSIS — Z79899 Other long term (current) drug therapy: Secondary | ICD-10-CM | POA: Diagnosis not present

## 2021-04-14 DIAGNOSIS — D631 Anemia in chronic kidney disease: Secondary | ICD-10-CM | POA: Diagnosis not present

## 2021-04-14 MED ORDER — EPOETIN ALFA-EPBX 40000 UNIT/ML IJ SOLN
40000.0000 [IU] | Freq: Once | INTRAMUSCULAR | Status: AC
  Administered 2021-04-14: 40000 [IU] via SUBCUTANEOUS
  Filled 2021-04-14: qty 1

## 2021-04-14 MED ORDER — FILGRASTIM-SNDZ 480 MCG/0.8ML IJ SOSY
480.0000 ug | PREFILLED_SYRINGE | Freq: Once | INTRAMUSCULAR | Status: AC
  Administered 2021-04-14: 480 ug via SUBCUTANEOUS
  Filled 2021-04-14: qty 0.8

## 2021-04-14 NOTE — Patient Instructions (Signed)
Epoetin Alfa injection What is this medication? EPOETIN ALFA (e POE e tin AL fa) helps your body make more red blood cells. This medicine is used to treat anemia caused by chronic kidney disease, cancer chemotherapy, or HIV-therapy. It may also be used before surgery if you have anemia. This medicine may be used for other purposes; ask your health care provider or pharmacist if you have questions. COMMON BRAND NAME(S): Epogen, Procrit, Retacrit What should I tell my care team before I take this medication? They need to know if you have any of these conditions: cancer heart disease high blood pressure history of blood clots history of stroke low levels of folate, iron, or vitamin B12 in the blood seizures an unusual or allergic reaction to erythropoietin, albumin, benzyl alcohol, hamster proteins, other medicines, foods, dyes, or preservatives pregnant or trying to get pregnant breast-feeding How should I use this medication? This medicine is for injection into a vein or under the skin. It is usually given by a health care professional in a hospital or clinic setting. If you get this medicine at home, you will be taught how to prepare and give this medicine. Use exactly as directed. Take your medicine at regular intervals. Do not take your medicine more often than directed. It is important that you put your used needles and syringes in a special sharps container. Do not put them in a trash can. If you do not have a sharps container, call your pharmacist or healthcare provider to get one. A special MedGuide will be given to you by the pharmacist with each prescription and refill. Be sure to read this information carefully each time. Talk to your pediatrician regarding the use of this medicine in children. While this drug may be prescribed for selected conditions, precautions do apply. Overdosage: If you think you have taken too much of this medicine contact a poison control center or emergency  room at once. NOTE: This medicine is only for you. Do not share this medicine with others. What if I miss a dose? If you miss a dose, take it as soon as you can. If it is almost time for your next dose, take only that dose. Do not take double or extra doses. What may interact with this medication? Interactions have not been studied. This list may not describe all possible interactions. Give your health care provider a list of all the medicines, herbs, non-prescription drugs, or dietary supplements you use. Also tell them if you smoke, drink alcohol, or use illegal drugs. Some items may interact with your medicine. What should I watch for while using this medication? Your condition will be monitored carefully while you are receiving this medicine. You may need blood work done while you are taking this medicine. This medicine may cause a decrease in vitamin B6. You should make sure that you get enough vitamin B6 while you are taking this medicine. Discuss the foods you eat and the vitamins you take with your health care professional. What side effects may I notice from receiving this medication? Side effects that you should report to your doctor or health care professional as soon as possible: allergic reactions like skin rash, itching or hives, swelling of the face, lips, or tongue seizures signs and symptoms of a blood clot such as breathing problems; changes in vision; chest pain; severe, sudden headache; pain, swelling, warmth in the leg; trouble speaking; sudden numbness or weakness of the face, arm or leg signs and symptoms of a stroke like   changes in vision; confusion; trouble speaking or understanding; severe headaches; sudden numbness or weakness of the face, arm or leg; trouble walking; dizziness; loss of balance or coordination Side effects that usually do not require medical attention (report to your doctor or health care professional if they continue or are  bothersome): chills cough dizziness fever headaches joint pain muscle cramps muscle pain nausea, vomiting pain, redness, or irritation at site where injected This list may not describe all possible side effects. Call your doctor for medical advice about side effects. You may report side effects to FDA at 1-800-FDA-1088. Where should I keep my medication? Keep out of the reach of children. Store in a refrigerator between 2 and 8 degrees C (36 and 46 degrees F). Do not freeze or shake. Throw away any unused portion if using a single-dose vial. Multi-dose vials can be kept in the refrigerator for up to 21 days after the initial dose. Throw away unused medicine. NOTE: This sheet is a summary. It may not cover all possible information. If you have questions about this medicine, talk to your doctor, pharmacist, or health care provider.  2022 Elsevier/Gold Standard (2017-01-05 00:00:00) Filgrastim, G-CSF injection What is this medication? FILGRASTIM, G-CSF (fil GRA stim) is a granulocyte colony-stimulating factor that stimulates the growth of neutrophils, a type of white blood cell (WBC) important in the body's fight against infection. It is used to reduce the incidence of fever and infection in patients with certain types of cancer who are receiving chemotherapy that affects the bone marrow, to stimulate blood cell production for removal of WBCs from the body prior to a bone marrow transplantation, to reduce the incidence of fever and infection in patients who have severe chronic neutropenia, and to improve survival outcomes following high-dose radiation exposure that is toxic to the bone marrow. This medicine may be used for other purposes; ask your health care provider or pharmacist if you have questions. COMMON BRAND NAME(S): Neupogen, Nivestym, Releuko, Zarxio What should I tell my care team before I take this medication? They need to know if you have any of these conditions: kidney  disease latex allergy ongoing radiation therapy sickle cell disease an unusual or allergic reaction to filgrastim, pegfilgrastim, other medicines, foods, dyes, or preservatives pregnant or trying to get pregnant breast-feeding How should I use this medication? This medicine is for injection under the skin or infusion into a vein. As an infusion into a vein, it is usually given by a health care professional in a hospital or clinic setting. If you get this medicine at home, you will be taught how to prepare and give this medicine. Refer to the Instructions for Use that come with your medication packaging. Use exactly as directed. Take your medicine at regular intervals. Do not take your medicine more often than directed. It is important that you put your used needles and syringes in a special sharps container. Do not put them in a trash can. If you do not have a sharps container, call your pharmacist or healthcare provider to get one. Talk to your pediatrician regarding the use of this medicine in children. While this drug may be prescribed for children as young as 7 months for selected conditions, precautions do apply. Overdosage: If you think you have taken too much of this medicine contact a poison control center or emergency room at once. NOTE: This medicine is only for you. Do not share this medicine with others. What if I miss a dose? It is important not  to miss your dose. Call your doctor or health care professional if you miss a dose. What may interact with this medication? This medicine may interact with the following medications: medicines that may cause a release of neutrophils, such as lithium This list may not describe all possible interactions. Give your health care provider a list of all the medicines, herbs, non-prescription drugs, or dietary supplements you use. Also tell them if you smoke, drink alcohol, or use illegal drugs. Some items may interact with your medicine. What should  I watch for while using this medication? Your condition will be monitored carefully while you are receiving this medicine. You may need blood work done while you are taking this medicine. Talk to your health care provider about your risk of cancer. You may be more at risk for certain types of cancer if you take this medicine. What side effects may I notice from receiving this medication? Side effects that you should report to your doctor or health care professional as soon as possible: allergic reactions like skin rash, itching or hives, swelling of the face, lips, or tongue back pain dizziness or feeling faint fever pain, redness, or irritation at site where injected pinpoint red spots on the skin shortness of breath or breathing problems signs and symptoms of kidney injury like trouble passing urine, change in the amount of urine, or red or dark-brown urine stomach or side pain, or pain at the shoulder swelling tiredness unusual bleeding or bruising Side effects that usually do not require medical attention (report to your doctor or health care professional if they continue or are bothersome): bone pain cough diarrhea hair loss headache muscle pain This list may not describe all possible side effects. Call your doctor for medical advice about side effects. You may report side effects to FDA at 1-800-FDA-1088. Where should I keep my medication? Keep out of the reach of children. Store in a refrigerator between 2 and 8 degrees C (36 and 46 degrees F). Do not freeze. Keep in carton to protect from light. Throw away this medicine if vials or syringes are left out of the refrigerator for more than 24 hours. Throw away any unused medicine after the expiration date. NOTE: This sheet is a summary. It may not cover all possible information. If you have questions about this medicine, talk to your doctor, pharmacist, or health care provider.  2022 Elsevier/Gold Standard (2021-01-21  00:00:00)

## 2021-04-18 ENCOUNTER — Ambulatory Visit (INDEPENDENT_AMBULATORY_CARE_PROVIDER_SITE_OTHER): Payer: Medicare PPO | Admitting: Sports Medicine

## 2021-04-18 DIAGNOSIS — M79675 Pain in left toe(s): Secondary | ICD-10-CM

## 2021-04-18 DIAGNOSIS — B351 Tinea unguium: Secondary | ICD-10-CM

## 2021-04-18 DIAGNOSIS — L97511 Non-pressure chronic ulcer of other part of right foot limited to breakdown of skin: Secondary | ICD-10-CM

## 2021-04-18 DIAGNOSIS — L97521 Non-pressure chronic ulcer of other part of left foot limited to breakdown of skin: Secondary | ICD-10-CM

## 2021-04-18 DIAGNOSIS — M79674 Pain in right toe(s): Secondary | ICD-10-CM

## 2021-04-18 DIAGNOSIS — E1140 Type 2 diabetes mellitus with diabetic neuropathy, unspecified: Secondary | ICD-10-CM

## 2021-04-18 NOTE — Progress Notes (Signed)
Subjective: Maureen Stout is a 87 y.o. female patient with history of diabetes who returns to office today for diabetic nail and wound care chronic.  Patient reports some bleeding from the areas when she walks barefooted to go to the bathroom and states that she has been using the silver-cream on them or Betadine sometimes.  Patient denies any other pedal complaints at this time.  Patient is assisted by family member who is waiting for her in the lobby.  Objective: General: Patient is awake, alert, and oriented x 3 and in no acute distress.  Integument: Skin is warm, dry and supple bilateral. Nails are elongated thickened and dystrophic with subungual debris, consistent with onychomycosis, 1-5 on right 1,2,4,5 on left.  Callus with pinpoint bleeding noted to the left plantar forefoot submet 1 and left second toe, measures one by one cm, and submet 1 on right that measures 0.5 x 0.5 cm, larger than previous, all with a granular base, no significant redness, no significant warmth, no malodor.  No acute signs of infection.    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 Asymptomatic bunion and hammertoe pedal deformities noted bilateral. Muscular strength 5/5 in all lower extremity muscular groups bilateral without pain on range of motion. No tenderness with calf compression bilateral.  Assessment and plan Problem List Items Addressed This Visit   None Visit Diagnoses     Foot ulcer, left, limited to breakdown of skin (HCC)    -  Primary   Foot ulcer, limited to breakdown of skin, right (HCC)       Pain due to onychomycosis of toenails of both feet       Type 2 diabetes, controlled, with neuropathy ([place])           -Examined patient. -Re-Discussed and educated patient on diabetic foot care, especially with regards to the vascular, neurological and musculoskeletal systems.  -Mechanically debrided all nails x9 using sterile nail nipper without  incident and smooth with rotary bur  -At no additional charge mechanically debrided plantar ulcer left second toe and left and right forefoot submet 1 using a sterile chisel blade without incident and applied Iodosorb and padded gauze dressing and advised patient to refrain from walking barefoot like previous and to use house shoes or tennis shoes however patient still continues to wear just gripping socks and not shoes as I have recommended; reiterated this again to the patient -Ordered home nursing to assist with compliance with wound care order sent to [facility] home health for calcium alginate dressing 1-2 times weekly -Return to office in 10-12 weeks for nail care or sooner if problems or issues arise -Patient advised to call the office if any problems or questions arise in the meantime.  Landis Martins, DPM

## 2021-04-23 ENCOUNTER — Telehealth: Payer: Self-pay | Admitting: *Deleted

## 2021-04-23 ENCOUNTER — Encounter: Payer: Self-pay | Admitting: Oncology

## 2021-04-23 NOTE — Addendum Note (Signed)
Addended by: Juanetta Beets on: 04/23/2021 03:34 PM   Modules accepted: Orders

## 2021-04-23 NOTE — Telephone Encounter (Signed)
[name] from [facility] called and stated that she needed a verbal from Dr Cannon Kettle stating that it was ok for the nurse to go out and see the patient 2 x a week for 8 weeks and also to check on the 2nd toe left and seems to be doing ok and if it opens up a little will use betadine and per Dr Ander Gaster gave the verbal order and I relayed that to [facility] from [facility]. Lattie Haw

## 2021-04-24 ENCOUNTER — Other Ambulatory Visit: Payer: Self-pay

## 2021-04-24 ENCOUNTER — Inpatient Hospital Stay: Payer: Medicare PPO | Attending: Oncology

## 2021-04-24 VITALS — BP 157/71 | HR 70 | Temp 98.0°F | Resp 18 | Wt 142.1 lb

## 2021-04-24 DIAGNOSIS — D631 Anemia in chronic kidney disease: Secondary | ICD-10-CM | POA: Insufficient documentation

## 2021-04-24 DIAGNOSIS — Z79899 Other long term (current) drug therapy: Secondary | ICD-10-CM | POA: Insufficient documentation

## 2021-04-24 DIAGNOSIS — D460 Refractory anemia without ring sideroblasts, so stated: Secondary | ICD-10-CM | POA: Insufficient documentation

## 2021-04-24 DIAGNOSIS — N184 Chronic kidney disease, stage 4 (severe): Secondary | ICD-10-CM | POA: Insufficient documentation

## 2021-04-24 MED ORDER — FILGRASTIM-SNDZ 480 MCG/0.8ML IJ SOSY
480.0000 ug | PREFILLED_SYRINGE | Freq: Once | INTRAMUSCULAR | Status: AC
  Administered 2021-04-24: 480 ug via SUBCUTANEOUS
  Filled 2021-04-24: qty 0.8

## 2021-04-24 MED ORDER — EPOETIN ALFA-EPBX 40000 UNIT/ML IJ SOLN
40000.0000 [IU] | Freq: Once | INTRAMUSCULAR | Status: AC
  Administered 2021-04-24: 40000 [IU] via SUBCUTANEOUS
  Filled 2021-04-24: qty 1

## 2021-04-24 NOTE — Patient Instructions (Signed)
Epoetin Alfa injection What is this medication? EPOETIN ALFA (e POE e tin AL fa) helps your body make more red blood cells. This medicine is used to treat anemia caused by chronic kidney disease, cancer chemotherapy, or HIV-therapy. It may also be used before surgery if you have anemia. This medicine may be used for other purposes; ask your health care provider or pharmacist if you have questions. COMMON BRAND NAME(S): Epogen, Procrit, Retacrit What should I tell my care team before I take this medication? They need to know if you have any of these conditions: cancer heart disease high blood pressure history of blood clots history of stroke low levels of folate, iron, or vitamin B12 in the blood seizures an unusual or allergic reaction to erythropoietin, albumin, benzyl alcohol, hamster proteins, other medicines, foods, dyes, or preservatives pregnant or trying to get pregnant breast-feeding How should I use this medication? This medicine is for injection into a vein or under the skin. It is usually given by a health care professional in a hospital or clinic setting. If you get this medicine at home, you will be taught how to prepare and give this medicine. Use exactly as directed. Take your medicine at regular intervals. Do not take your medicine more often than directed. It is important that you put your used needles and syringes in a special sharps container. Do not put them in a trash can. If you do not have a sharps container, call your pharmacist or healthcare provider to get one. A special MedGuide will be given to you by the pharmacist with each prescription and refill. Be sure to read this information carefully each time. Talk to your pediatrician regarding the use of this medicine in children. While this drug may be prescribed for selected conditions, precautions do apply. Overdosage: If you think you have taken too much of this medicine contact a poison control center or emergency  room at once. NOTE: This medicine is only for you. Do not share this medicine with others. What if I miss a dose? If you miss a dose, take it as soon as you can. If it is almost time for your next dose, take only that dose. Do not take double or extra doses. What may interact with this medication? Interactions have not been studied. This list may not describe all possible interactions. Give your health care provider a list of all the medicines, herbs, non-prescription drugs, or dietary supplements you use. Also tell them if you smoke, drink alcohol, or use illegal drugs. Some items may interact with your medicine. What should I watch for while using this medication? Your condition will be monitored carefully while you are receiving this medicine. You may need blood work done while you are taking this medicine. This medicine may cause a decrease in vitamin B6. You should make sure that you get enough vitamin B6 while you are taking this medicine. Discuss the foods you eat and the vitamins you take with your health care professional. What side effects may I notice from receiving this medication? Side effects that you should report to your doctor or health care professional as soon as possible: allergic reactions like skin rash, itching or hives, swelling of the face, lips, or tongue seizures signs and symptoms of a blood clot such as breathing problems; changes in vision; chest pain; severe, sudden headache; pain, swelling, warmth in the leg; trouble speaking; sudden numbness or weakness of the face, arm or leg signs and symptoms of a stroke like   changes in vision; confusion; trouble speaking or understanding; severe headaches; sudden numbness or weakness of the face, arm or leg; trouble walking; dizziness; loss of balance or coordination Side effects that usually do not require medical attention (report to your doctor or health care professional if they continue or are  bothersome): chills cough dizziness fever headaches joint pain muscle cramps muscle pain nausea, vomiting pain, redness, or irritation at site where injected This list may not describe all possible side effects. Call your doctor for medical advice about side effects. You may report side effects to FDA at 1-800-FDA-1088. Where should I keep my medication? Keep out of the reach of children. Store in a refrigerator between 2 and 8 degrees C (36 and 46 degrees F). Do not freeze or shake. Throw away any unused portion if using a single-dose vial. Multi-dose vials can be kept in the refrigerator for up to 21 days after the initial dose. Throw away unused medicine. NOTE: This sheet is a summary. It may not cover all possible information. If you have questions about this medicine, talk to your doctor, pharmacist, or health care provider.  2022 Elsevier/Gold Standard (2017-01-05 00:00:00) Filgrastim, G-CSF injection What is this medication? FILGRASTIM, G-CSF (fil GRA stim) is a granulocyte colony-stimulating factor that stimulates the growth of neutrophils, a type of white blood cell (WBC) important in the body's fight against infection. It is used to reduce the incidence of fever and infection in patients with certain types of cancer who are receiving chemotherapy that affects the bone marrow, to stimulate blood cell production for removal of WBCs from the body prior to a bone marrow transplantation, to reduce the incidence of fever and infection in patients who have severe chronic neutropenia, and to improve survival outcomes following high-dose radiation exposure that is toxic to the bone marrow. This medicine may be used for other purposes; ask your health care provider or pharmacist if you have questions. COMMON BRAND NAME(S): Neupogen, Nivestym, Releuko, Zarxio What should I tell my care team before I take this medication? They need to know if you have any of these conditions: kidney  disease latex allergy ongoing radiation therapy sickle cell disease an unusual or allergic reaction to filgrastim, pegfilgrastim, other medicines, foods, dyes, or preservatives pregnant or trying to get pregnant breast-feeding How should I use this medication? This medicine is for injection under the skin or infusion into a vein. As an infusion into a vein, it is usually given by a health care professional in a hospital or clinic setting. If you get this medicine at home, you will be taught how to prepare and give this medicine. Refer to the Instructions for Use that come with your medication packaging. Use exactly as directed. Take your medicine at regular intervals. Do not take your medicine more often than directed. It is important that you put your used needles and syringes in a special sharps container. Do not put them in a trash can. If you do not have a sharps container, call your pharmacist or healthcare provider to get one. Talk to your pediatrician regarding the use of this medicine in children. While this drug may be prescribed for children as young as 7 months for selected conditions, precautions do apply. Overdosage: If you think you have taken too much of this medicine contact a poison control center or emergency room at once. NOTE: This medicine is only for you. Do not share this medicine with others. What if I miss a dose? It is important not  to miss your dose. Call your doctor or health care professional if you miss a dose. What may interact with this medication? This medicine may interact with the following medications: medicines that may cause a release of neutrophils, such as lithium This list may not describe all possible interactions. Give your health care provider a list of all the medicines, herbs, non-prescription drugs, or dietary supplements you use. Also tell them if you smoke, drink alcohol, or use illegal drugs. Some items may interact with your medicine. What should  I watch for while using this medication? Your condition will be monitored carefully while you are receiving this medicine. You may need blood work done while you are taking this medicine. Talk to your health care provider about your risk of cancer. You may be more at risk for certain types of cancer if you take this medicine. What side effects may I notice from receiving this medication? Side effects that you should report to your doctor or health care professional as soon as possible: allergic reactions like skin rash, itching or hives, swelling of the face, lips, or tongue back pain dizziness or feeling faint fever pain, redness, or irritation at site where injected pinpoint red spots on the skin shortness of breath or breathing problems signs and symptoms of kidney injury like trouble passing urine, change in the amount of urine, or red or dark-brown urine stomach or side pain, or pain at the shoulder swelling tiredness unusual bleeding or bruising Side effects that usually do not require medical attention (report to your doctor or health care professional if they continue or are bothersome): bone pain cough diarrhea hair loss headache muscle pain This list may not describe all possible side effects. Call your doctor for medical advice about side effects. You may report side effects to FDA at 1-800-FDA-1088. Where should I keep my medication? Keep out of the reach of children. Store in a refrigerator between 2 and 8 degrees C (36 and 46 degrees F). Do not freeze. Keep in carton to protect from light. Throw away this medicine if vials or syringes are left out of the refrigerator for more than 24 hours. Throw away any unused medicine after the expiration date. NOTE: This sheet is a summary. It may not cover all possible information. If you have questions about this medicine, talk to your doctor, pharmacist, or health care provider.  2022 Elsevier/Gold Standard (2021-01-21  00:00:00)

## 2021-04-24 NOTE — Progress Notes (Signed)
Cardiology Office Note:    Date:  04/25/2021   ID:  Maureen Stout, DOB 1932/01/22, MRN [id number]  PCP:  Nicoletta Dress, MD  Cardiologist:  Shirlee More, MD    Referring MD: Nicoletta Dress, MD    ASSESSMENT:    1. Paroxysmal atrial fibrillation (HCC)   2. On amiodarone therapy   3. Chronic anticoagulation   4. Hypertensive heart disease with chronic combined systolic and diastolic congestive heart failure ([place])   5. Anemia, unspecified type    PLAN:    In order of problems listed above:  Stable continue low-dose amiodarone and reduced dose anticoagulant Stable heart failure no fluid overload despite her chronic anemia she will continue her current loop diuretic and vasodilator therapy with hydralazine and isosorbide nitrate. Anemia stable managed by nephrology combination of chronic disease and mild well displeasure   Next appointment: 6 months   Medication Adjustments/Labs and Tests Ordered: Current medicines are reviewed at length with the patient today.  Concerns regarding medicines are outlined above.  Orders Placed This Encounter  Procedures   EKG 12-Lead   No orders of the defined types were placed in this encounter.   Chief complaint follow-up with atrial fibrillation on amiodarone and heart failure History of Present Illness:    Maureen Stout is a 87 y.o. female with a hx of atrial fibrillation maintaining sinus rhythm on amiodarone hypertension dyslipidemia chronic anticoagulation CKD previous severe left ventricular dysfunction with normalization of ejection fraction 12/23/2018. She was admitted to [facility] February 2002 with decompensated heart failure was found to be anemic with a hemoglobin of 8.1 and her EF at that time was 35 to 40% with left bundle branch block. She was last seen 11/13/2020.  She has been evaluated by hematology and found to have anemia secondary to chronic renal insufficiency and mild low-grade dysplasia.   Her last hemoglobin 2 weeks ago was 8.5. Compliance with diet, lifestyle and medications: Yes  Is a very peaceful woman is okay with what is happening in her life with the chronic anemia still lives independently.  She is not having edema shortness of breath time she has palpitation not severe or sustained and she tolerates her anticoagulant without clinical bleeding. Past Medical History:  Diagnosis Date   Abnormal CXR 04/13/2018   Abnormality of gait 10/19/2018   Acute blood loss anemia    Acute combined systolic and diastolic congestive heart failure (HCC)    Acute on chronic anemia 09/07/2018   Acute on chronic kidney failure (HCC)    Acute pancreatitis without infection or necrosis 09/07/2018   Anemia of chronic disease    Anemia of chronic renal failure    Bacteremia 09/08/2018   Bladder problem    Bradycardia 09/07/2018   Cardiomegaly    Chronic anticoagulation 01/08/2018   CKD (chronic kidney disease), stage III ([place])    Debility 09/19/2018   Diabetes mellitus ([place])    Dyslipidemia    Edema    Esophageal dysmotility    Failure to thrive (child)    First degree AV block 09/06/2018   Gross hematuria 07/05/2017   Hypertension    Hypertensive heart disease 01/08/2018   Hypoglycemia without diagnosis of diabetes mellitus 09/08/2018   Hypothyroidism 09/06/2018   OAB (overactive bladder) 07/05/2017   On amiodarone therapy 01/08/2018   PAH (pulmonary artery hypertension) (HCC)    Paroxysmal A-fib (HCC)    Paroxysmal atrial fibrillation ([place]) 04/18/2017   Paroxysmal atrial flutter ([place])  Recurrent UTI    Sepsis due to Enterobacter species ([place]) 09/08/2018   Sinus bradycardia    Urinary retention     Past Surgical History:  Procedure Laterality Date   PARTIAL HYSTERECTOMY     TOE SURGERY     TONSILLECTOMY      Current Medications: Current Meds  Medication Sig   amiodarone (PACERONE) 200 MG tablet TAKE 1/2 TABLET EVERY DAY   ampicillin (PRINCIPEN) 500 MG capsule Take 1 capsule  by mouth daily.   apixaban (ELIQUIS) 2.5 MG TABS tablet Take 1 tablet (2.5 mg total) by mouth 2 (two) times daily.   atorvastatin (LIPITOR) 10 MG tablet Take 1 tablet (10 mg total) by mouth daily.   Blood Glucose Monitoring Suppl (TRUE METRIX METER) w/Device KIT    Calcium Carbonate-Vitamin D (CALCIUM-D) 600-400 MG-UNIT TABS Take 1 tablet by mouth 2 (two) times daily.   furosemide (LASIX) 40 MG tablet Take 0.5 tablets by mouth daily.   hydrALAZINE (APRESOLINE) 25 MG tablet Take 0.5 tablets (12.5 mg total) by mouth 3 (three) times daily.   isosorbide dinitrate (ISORDIL) 10 MG tablet Take 1 tablet (10 mg total) by mouth 3 (three) times daily.   levothyroxine (SYNTHROID) 88 MCG tablet Take 88 mcg by mouth daily.   Multiple Vitamin (MULTI-VITAMINS) TABS Take 1 tablet by mouth daily.    polyethylene glycol (MIRALAX / GLYCOLAX) 17 g packet Take 17 g by mouth daily. For constipation.   silver sulfADIAZINE (SILVADENE) 1 % cream Apply 1 application topically daily.   torsemide (DEMADEX) 20 MG tablet Take 1 tablet (20 mg total) by mouth every other day.   TRUE METRIX BLOOD GLUCOSE TEST test strip SMARTSIG:Via Meter   TRUEplus Lancets 33G MISC      Allergies:   Vancomycin and Uribel [meth-hyo-m bl-na phos-ph sal]   Social History   Socioeconomic History   Marital status: Widowed    Spouse name: Not on file   Number of children: Not on file   Years of education: Not on file   Highest education level: Not on file  Occupational History   Not on file  Tobacco Use   Smoking status: Never   Smokeless tobacco: Never  Vaping Use   Vaping Use: Never used  Substance and Sexual Activity   Alcohol use: No   Drug use: No   Sexual activity: Not on file  Other Topics Concern   Not on file  Social History Narrative   Not on file   Social Determinants of Health   Financial Resource Strain: Not on file  Food Insecurity: Not on file  Transportation Needs: Not on file  Physical Activity: Not on file   Stress: Not on file  Social Connections: Not on file     Family History: The patient's family history includes Breast cancer in her sister; Cancer in her father. There is no history of Diabetes or Heart attack. ROS:   Please see the history of present illness.    All other systems reviewed and are negative.  EKGs/Labs/Other Studies Reviewed:    The following studies were reviewed today:  EKG:  EKG ordered today and personally reviewed.  The ekg ordered today demonstrates baseline sinus rhythm she also has having PVCs and APCs.  Recent Labs: 11/22/2020: ALT 14; BUN 41; Creatinine 1.8; Potassium 4.5; Sodium 134 04/09/2021: Hemoglobin 8.5; Platelets 238  Recent Lipid Panel    Component Value Date/Time   CHOL 151 05/16/2019 1519   TRIG 140 05/16/2019 1519   HDL  53 05/16/2019 1519   CHOLHDL 2.8 05/16/2019 1519   CHOLHDL 2.2 09/08/2018 0529   VLDL 7 09/08/2018 0529   LDLCALC 74 05/16/2019 1519    Physical Exam:    VS:  BP (!) 158/70 (BP Location: Right Arm, Patient Position: Sitting, Cuff Size: Normal)   Pulse 68   Ht 5' 4" (1.626 m)   Wt 143 lb (64.9 kg)   SpO2 91%   BMI 24.55 kg/m     Wt Readings from Last 3 Encounters:  04/25/21 143 lb (64.9 kg)  04/24/21 142 lb 1.8 oz (64.5 kg)  04/14/21 145 lb 4 oz (65.9 kg)     GEN: She looks frail she is lost weight well nourished, well developed in no acute distress HEENT: Normal NECK: No JVD; No carotid bruits LYMPHATICS: No lymphadenopathy CARDIAC: Rhythm appears to be irregular  no murmurs, rubs, gallops RESPIRATORY:  Clear to auscultation without rales, wheezing or rhonchi  ABDOMEN: Soft, non-tender, non-distended MUSCULOSKELETAL:  No edema; No deformity  SKIN: Warm and dry she has pallor of her skin NEUROLOGIC:  Alert and oriented x 3 PSYCHIATRIC:  Normal affect    Signed, Brian Munley, MD  04/25/2021 4:36 PM    [facility]   

## 2021-04-24 NOTE — Progress Notes (Signed)
Patient denies need for appt calender

## 2021-04-25 ENCOUNTER — Ambulatory Visit (INDEPENDENT_AMBULATORY_CARE_PROVIDER_SITE_OTHER): Payer: Medicare PPO | Admitting: Cardiology

## 2021-04-25 ENCOUNTER — Ambulatory Visit: Payer: Medicare PPO

## 2021-04-25 ENCOUNTER — Encounter: Payer: Self-pay | Admitting: Cardiology

## 2021-04-25 VITALS — BP 158/70 | HR 68 | Ht 64.0 in | Wt 143.0 lb

## 2021-04-25 DIAGNOSIS — Z79899 Other long term (current) drug therapy: Secondary | ICD-10-CM | POA: Diagnosis not present

## 2021-04-25 DIAGNOSIS — I110 Hypertensive heart disease with heart failure: Secondary | ICD-10-CM | POA: Diagnosis not present

## 2021-04-25 DIAGNOSIS — I5042 Chronic combined systolic (congestive) and diastolic (congestive) heart failure: Secondary | ICD-10-CM

## 2021-04-25 DIAGNOSIS — I480 Paroxysmal atrial fibrillation: Secondary | ICD-10-CM | POA: Diagnosis not present

## 2021-04-25 DIAGNOSIS — D649 Anemia, unspecified: Secondary | ICD-10-CM

## 2021-04-25 DIAGNOSIS — Z7901 Long term (current) use of anticoagulants: Secondary | ICD-10-CM

## 2021-04-25 NOTE — Patient Instructions (Signed)

## 2021-05-02 ENCOUNTER — Ambulatory Visit: Payer: Medicare PPO

## 2021-05-02 ENCOUNTER — Other Ambulatory Visit: Payer: Self-pay | Admitting: Pharmacist

## 2021-05-08 ENCOUNTER — Encounter: Payer: Self-pay | Admitting: Hematology and Oncology

## 2021-05-08 ENCOUNTER — Inpatient Hospital Stay: Payer: Medicare PPO

## 2021-05-08 DIAGNOSIS — D649 Anemia, unspecified: Secondary | ICD-10-CM | POA: Diagnosis not present

## 2021-05-08 DIAGNOSIS — D469 Myelodysplastic syndrome, unspecified: Secondary | ICD-10-CM | POA: Diagnosis not present

## 2021-05-08 LAB — CBC AND DIFFERENTIAL
HCT: 26 — AB (ref 36–46)
Hemoglobin: 8.1 — AB (ref 12.0–16.0)
Neutrophils Absolute: 4.55
Platelets: 250 (ref 150–399)
WBC: 6.6

## 2021-05-08 LAB — CBC: RBC: 2.83 — AB (ref 3.87–5.11)

## 2021-05-13 ENCOUNTER — Other Ambulatory Visit: Payer: Self-pay

## 2021-05-13 ENCOUNTER — Inpatient Hospital Stay: Payer: Medicare PPO

## 2021-05-13 VITALS — BP 136/64 | HR 77 | Temp 98.3°F | Resp 18 | Ht 64.0 in | Wt 142.1 lb

## 2021-05-13 DIAGNOSIS — Z79899 Other long term (current) drug therapy: Secondary | ICD-10-CM | POA: Diagnosis not present

## 2021-05-13 DIAGNOSIS — D631 Anemia in chronic kidney disease: Secondary | ICD-10-CM | POA: Diagnosis not present

## 2021-05-13 DIAGNOSIS — N184 Chronic kidney disease, stage 4 (severe): Secondary | ICD-10-CM | POA: Diagnosis not present

## 2021-05-13 DIAGNOSIS — D460 Refractory anemia without ring sideroblasts, so stated: Secondary | ICD-10-CM | POA: Diagnosis not present

## 2021-05-13 MED ORDER — FILGRASTIM-SNDZ 480 MCG/0.8ML IJ SOSY
480.0000 ug | PREFILLED_SYRINGE | Freq: Once | INTRAMUSCULAR | Status: AC
  Administered 2021-05-13: 480 ug via SUBCUTANEOUS
  Filled 2021-05-13: qty 0.8

## 2021-05-13 MED ORDER — EPOETIN ALFA-EPBX 40000 UNIT/ML IJ SOLN
40000.0000 [IU] | Freq: Once | INTRAMUSCULAR | Status: AC
  Administered 2021-05-13: 40000 [IU] via SUBCUTANEOUS
  Filled 2021-05-13: qty 1

## 2021-05-13 NOTE — Patient Instructions (Signed)
Epoetin Alfa injection °What is this medication? °EPOETIN ALFA (e POE e tin AL fa) helps your body make more red blood cells. This medicine is used to treat anemia caused by chronic kidney disease, cancer chemotherapy, or HIV-therapy. It may also be used before surgery if you have anemia. °This medicine may be used for other purposes; ask your health care provider or pharmacist if you have questions. °COMMON BRAND NAME(S): Epogen, Procrit, Retacrit °What should I tell my care team before I take this medication? °They need to know if you have any of these conditions: °cancer °heart disease °high blood pressure °history of blood clots °history of stroke °low levels of folate, iron, or vitamin B12 in the blood °seizures °an unusual or allergic reaction to erythropoietin, albumin, benzyl alcohol, hamster proteins, other medicines, foods, dyes, or preservatives °pregnant or trying to get pregnant °breast-feeding °How should I use this medication? °This medicine is for injection into a vein or under the skin. It is usually given by a health care professional in a hospital or clinic setting. °If you get this medicine at home, you will be taught how to prepare and give this medicine. Use exactly as directed. Take your medicine at regular intervals. Do not take your medicine more often than directed. °It is important that you put your used needles and syringes in a special sharps container. Do not put them in a trash can. If you do not have a sharps container, call your pharmacist or healthcare provider to get one. °A special MedGuide will be given to you by the pharmacist with each prescription and refill. Be sure to read this information carefully each time. °Talk to your pediatrician regarding the use of this medicine in children. While this drug may be prescribed for selected conditions, precautions do apply. °Overdosage: If you think you have taken too much of this medicine contact a poison control center or emergency  room at once. °NOTE: This medicine is only for you. Do not share this medicine with others. °What if I miss a dose? °If you miss a dose, take it as soon as you can. If it is almost time for your next dose, take only that dose. Do not take double or extra doses. °What may interact with this medication? °Interactions have not been studied. °This list may not describe all possible interactions. Give your health care provider a list of all the medicines, herbs, non-prescription drugs, or dietary supplements you use. Also tell them if you smoke, drink alcohol, or use illegal drugs. Some items may interact with your medicine. °What should I watch for while using this medication? °Your condition will be monitored carefully while you are receiving this medicine. °You may need blood work done while you are taking this medicine. °This medicine may cause a decrease in vitamin B6. You should make sure that you get enough vitamin B6 while you are taking this medicine. Discuss the foods you eat and the vitamins you take with your health care professional. °What side effects may I notice from receiving this medication? °Side effects that you should report to your doctor or health care professional as soon as possible: °allergic reactions like skin rash, itching or hives, swelling of the face, lips, or tongue °seizures °signs and symptoms of a blood clot such as breathing problems; changes in vision; chest pain; severe, sudden headache; pain, swelling, warmth in the leg; trouble speaking; sudden numbness or weakness of the face, arm or leg °signs and symptoms of a stroke like   changes in vision; confusion; trouble speaking or understanding; severe headaches; sudden numbness or weakness of the face, arm or leg; trouble walking; dizziness; loss of balance or coordination Side effects that usually do not require medical attention (report to your doctor or health care professional if they continue or are  bothersome): chills cough dizziness fever headaches joint pain muscle cramps muscle pain nausea, vomiting pain, redness, or irritation at site where injected This list may not describe all possible side effects. Call your doctor for medical advice about side effects. You may report side effects to FDA at 1-800-FDA-1088. Where should I keep my medication? Keep out of the reach of children. Store in a refrigerator between 2 and 8 degrees C (36 and 46 degrees F). Do not freeze or shake. Throw away any unused portion if using a single-dose vial. Multi-dose vials can be kept in the refrigerator for up to 21 days after the initial dose. Throw away unused medicine. NOTE: This sheet is a summary. It may not cover all possible information. If you have questions about this medicine, talk to your doctor, pharmacist, or health care provider.  2022 Elsevier/Gold Standard (2017-01-05 00:00:00) Filgrastim, G-CSF injection What is this medication? FILGRASTIM, G-CSF (fil GRA stim) is a granulocyte colony-stimulating factor that stimulates the growth of neutrophils, a type of white blood cell (WBC) important in the body's fight against infection. It is used to reduce the incidence of fever and infection in patients with certain types of cancer who are receiving chemotherapy that affects the bone marrow, to stimulate blood cell production for removal of WBCs from the body prior to a bone marrow transplantation, to reduce the incidence of fever and infection in patients who have severe chronic neutropenia, and to improve survival outcomes following high-dose radiation exposure that is toxic to the bone marrow. This medicine may be used for other purposes; ask your health care provider or pharmacist if you have questions. COMMON BRAND NAME(S): Neupogen, Nivestym, Releuko, Zarxio What should I tell my care team before I take this medication? They need to know if you have any of these conditions: kidney  disease latex allergy ongoing radiation therapy sickle cell disease an unusual or allergic reaction to filgrastim, pegfilgrastim, other medicines, foods, dyes, or preservatives pregnant or trying to get pregnant breast-feeding How should I use this medication? This medicine is for injection under the skin or infusion into a vein. As an infusion into a vein, it is usually given by a health care professional in a hospital or clinic setting. If you get this medicine at home, you will be taught how to prepare and give this medicine. Refer to the Instructions for Use that come with your medication packaging. Use exactly as directed. Take your medicine at regular intervals. Do not take your medicine more often than directed. It is important that you put your used needles and syringes in a special sharps container. Do not put them in a trash can. If you do not have a sharps container, call your pharmacist or healthcare provider to get one. Talk to your pediatrician regarding the use of this medicine in children. While this drug may be prescribed for children as young as 7 months for selected conditions, precautions do apply. Overdosage: If you think you have taken too much of this medicine contact a poison control center or emergency room at once. NOTE: This medicine is only for you. Do not share this medicine with others. What if I miss a dose? It is important not  to miss your dose. Call your doctor or health care professional if you miss a dose. What may interact with this medication? This medicine may interact with the following medications: medicines that may cause a release of neutrophils, such as lithium This list may not describe all possible interactions. Give your health care provider a list of all the medicines, herbs, non-prescription drugs, or dietary supplements you use. Also tell them if you smoke, drink alcohol, or use illegal drugs. Some items may interact with your medicine. What should  I watch for while using this medication? Your condition will be monitored carefully while you are receiving this medicine. You may need blood work done while you are taking this medicine. Talk to your health care provider about your risk of cancer. You may be more at risk for certain types of cancer if you take this medicine. What side effects may I notice from receiving this medication? Side effects that you should report to your doctor or health care professional as soon as possible: allergic reactions like skin rash, itching or hives, swelling of the face, lips, or tongue back pain dizziness or feeling faint fever pain, redness, or irritation at site where injected pinpoint red spots on the skin shortness of breath or breathing problems signs and symptoms of kidney injury like trouble passing urine, change in the amount of urine, or red or dark-brown urine stomach or side pain, or pain at the shoulder swelling tiredness unusual bleeding or bruising Side effects that usually do not require medical attention (report to your doctor or health care professional if they continue or are bothersome): bone pain cough diarrhea hair loss headache muscle pain This list may not describe all possible side effects. Call your doctor for medical advice about side effects. You may report side effects to FDA at 1-800-FDA-1088. Where should I keep my medication? Keep out of the reach of children. Store in a refrigerator between 2 and 8 degrees C (36 and 46 degrees F). Do not freeze. Keep in carton to protect from light. Throw away this medicine if vials or syringes are left out of the refrigerator for more than 24 hours. Throw away any unused medicine after the expiration date. NOTE: This sheet is a summary. It may not cover all possible information. If you have questions about this medicine, talk to your doctor, pharmacist, or health care provider.  2022 Elsevier/Gold Standard (2021-01-21  00:00:00)

## 2021-05-20 ENCOUNTER — Ambulatory Visit: Payer: Medicare PPO | Admitting: Sports Medicine

## 2021-05-27 ENCOUNTER — Inpatient Hospital Stay: Payer: Medicare PPO | Attending: Oncology

## 2021-05-27 ENCOUNTER — Other Ambulatory Visit: Payer: Self-pay

## 2021-05-27 VITALS — BP 133/80 | HR 83 | Temp 97.4°F | Resp 20 | Wt 144.5 lb

## 2021-05-27 DIAGNOSIS — D631 Anemia in chronic kidney disease: Secondary | ICD-10-CM | POA: Insufficient documentation

## 2021-05-27 DIAGNOSIS — N184 Chronic kidney disease, stage 4 (severe): Secondary | ICD-10-CM | POA: Diagnosis not present

## 2021-05-27 DIAGNOSIS — Z79899 Other long term (current) drug therapy: Secondary | ICD-10-CM | POA: Diagnosis not present

## 2021-05-27 DIAGNOSIS — D460 Refractory anemia without ring sideroblasts, so stated: Secondary | ICD-10-CM | POA: Insufficient documentation

## 2021-05-27 MED ORDER — FILGRASTIM-SNDZ 480 MCG/0.8ML IJ SOSY
480.0000 ug | PREFILLED_SYRINGE | Freq: Once | INTRAMUSCULAR | Status: AC
  Administered 2021-05-27: 480 ug via SUBCUTANEOUS
  Filled 2021-05-27: qty 0.8

## 2021-05-27 MED ORDER — EPOETIN ALFA-EPBX 40000 UNIT/ML IJ SOLN
40000.0000 [IU] | Freq: Once | INTRAMUSCULAR | Status: AC
  Administered 2021-05-27: 40000 [IU] via SUBCUTANEOUS
  Filled 2021-05-27: qty 1

## 2021-05-27 NOTE — Patient Instructions (Signed)
[facility]  Discharge Instructions: Thank you for choosing [facility] to provide your oncology and hematology care.  If you have a lab appointment with the [facility], please go directly to the [facility] and check in at the registration area.   Wear comfortable clothing and clothing appropriate for easy access to any Portacath or PICC line.   We strive to give you quality time with your provider. You may need to reschedule your appointment if you arrive late (15 or more minutes).  Arriving late affects you and other patients whose appointments are after yours.  Also, if you miss three or more appointments without notifying the office, you may be dismissed from the clinic at the providers discretion.      For prescription refill requests, have your pharmacy contact our office and allow 72 hours for refills to be completed.    Today you received the following chemotherapy and/or immunotherapy agents       To help prevent nausea and vomiting after your treatment, we encourage you to take your nausea medication as directed.  BELOW ARE SYMPTOMS THAT SHOULD BE REPORTED IMMEDIATELY: *FEVER GREATER THAN 100.4 F (38 C) OR HIGHER *CHILLS OR SWEATING *NAUSEA AND VOMITING THAT IS NOT CONTROLLED WITH YOUR NAUSEA MEDICATION *UNUSUAL SHORTNESS OF BREATH *UNUSUAL BRUISING OR BLEEDING *URINARY PROBLEMS (pain or burning when urinating, or frequent urination) *BOWEL PROBLEMS (unusual diarrhea, constipation, pain near the anus) TENDERNESS IN MOUTH AND THROAT WITH OR WITHOUT PRESENCE OF ULCERS (sore throat, sores in mouth, or a toothache) UNUSUAL RASH, SWELLING OR PAIN  UNUSUAL VAGINAL DISCHARGE OR ITCHING   Items with * indicate a potential emergency and should be followed up as soon as possible or go to the Emergency Department if any problems should occur.  Please show the CHEMOTHERAPY ALERT CARD or IMMUNOTHERAPY ALERT CARD at check-in to the Emergency  Department and triage nurse.  Should you have questions after your visit or need to cancel or reschedule your appointment, please contact [facility]  Dept: [phone]  and follow the prompts.  Office hours are 8:00 a.m. to 4:30 p.m. Monday - Friday. Please note that voicemails left after 4:00 p.m. may not be returned until the following business day.  We are closed weekends and major holidays. You have access to a nurse at all times for urgent questions. Please call the main number to the clinic Dept: [phone] and follow the prompts.  For any non-urgent questions, you may also contact your provider using MyChart. We now offer e-Visits for anyone 42 and older to request care online for non-urgent symptoms. For details visit [email or web address].   Also download the MyChart app! Go to the app store, search "MyChart", open the app, select [facility], and log in with your MyChart username and password.  Due to Covid, a mask is required upon entering the hospital/clinic. If you do not have a mask, one will be given to you upon arrival. For doctor visits, patients may have 1 support person aged 60 or older with them. For treatment visits, patients cannot have anyone with them due to current Covid guidelines and our immunocompromised population.   Filgrastim, G-CSF injection What is this medication? FILGRASTIM, G-CSF (fil GRA stim) is a granulocyte colony-stimulating factor that stimulates the growth of neutrophils, a type of white blood cell (WBC) important in the body's fight against infection. It is used to reduce the incidence of fever and infection in patients with  certain types of cancer who are receiving chemotherapy that affects the bone marrow, to stimulate blood cell production for removal of WBCs from the body prior to a bone marrow transplantation, to reduce the incidence of fever and infection in patients who have severe chronic neutropenia, and to improve  survival outcomes following high-dose radiation exposure that is toxic to the bone marrow. This medicine may be used for other purposes; ask your health care provider or pharmacist if you have questions. COMMON BRAND NAME(S): Neupogen, Nivestym, Releuko, Zarxio What should I tell my care team before I take this medication? They need to know if you have any of these conditions: kidney disease latex allergy ongoing radiation therapy sickle cell disease an unusual or allergic reaction to filgrastim, pegfilgrastim, other medicines, foods, dyes, or preservatives pregnant or trying to get pregnant breast-feeding How should I use this medication? This medicine is for injection under the skin or infusion into a vein. As an infusion into a vein, it is usually given by a health care professional in a hospital or clinic setting. If you get this medicine at home, you will be taught how to prepare and give this medicine. Refer to the Instructions for Use that come with your medication packaging. Use exactly as directed. Take your medicine at regular intervals. Do not take your medicine more often than directed. It is important that you put your used needles and syringes in a special sharps container. Do not put them in a trash can. If you do not have a sharps container, call your pharmacist or healthcare provider to get one. Talk to your pediatrician regarding the use of this medicine in children. While this drug may be prescribed for children as young as 7 months for selected conditions, precautions do apply. Overdosage: If you think you have taken too much of this medicine contact a poison control center or emergency room at once. NOTE: This medicine is only for you. Do not share this medicine with others. What if I miss a dose? It is important not to miss your dose. Call your doctor or health care professional if you miss a dose. What may interact with this medication? This medicine may interact with the  following medications: medicines that may cause a release of neutrophils, such as lithium This list may not describe all possible interactions. Give your health care provider a list of all the medicines, herbs, non-prescription drugs, or dietary supplements you use. Also tell them if you smoke, drink alcohol, or use illegal drugs. Some items may interact with your medicine. What should I watch for while using this medication? Your condition will be monitored carefully while you are receiving this medicine. You may need blood work done while you are taking this medicine. Talk to your health care provider about your risk of cancer. You may be more at risk for certain types of cancer if you take this medicine. What side effects may I notice from receiving this medication? Side effects that you should report to your doctor or health care professional as soon as possible: allergic reactions like skin rash, itching or hives, swelling of the face, lips, or tongue back pain dizziness or feeling faint fever pain, redness, or irritation at site where injected pinpoint red spots on the skin shortness of breath or breathing problems signs and symptoms of kidney injury like trouble passing urine, change in the amount of urine, or red or dark-brown urine stomach or side pain, or pain at the shoulder swelling tiredness unusual  bleeding or bruising Side effects that usually do not require medical attention (report to your doctor or health care professional if they continue or are bothersome): bone pain cough diarrhea hair loss headache muscle pain This list may not describe all possible side effects. Call your doctor for medical advice about side effects. You may report side effects to FDA at 1-800-FDA-1088. Where should I keep my medication? Keep out of the reach of children. Store in a refrigerator between 2 and 8 degrees C (36 and 46 degrees F). Do not freeze. Keep in carton to protect from light.  Throw away this medicine if vials or syringes are left out of the refrigerator for more than 24 hours. Throw away any unused medicine after the expiration date. NOTE: This sheet is a summary. It may not cover all possible information. If you have questions about this medicine, talk to your doctor, pharmacist, or health care provider.  2022 Elsevier/Gold Standard (2021-01-21 00:00:00) Epoetin Alfa injection What is this medication? EPOETIN ALFA (e POE e tin AL fa) helps your body make more red blood cells. This medicine is used to treat anemia caused by chronic kidney disease, cancer chemotherapy, or HIV-therapy. It may also be used before surgery if you have anemia. This medicine may be used for other purposes; ask your health care provider or pharmacist if you have questions. COMMON BRAND NAME(S): Epogen, Procrit, Retacrit What should I tell my care team before I take this medication? They need to know if you have any of these conditions: cancer heart disease high blood pressure history of blood clots history of stroke low levels of folate, iron, or vitamin B12 in the blood seizures an unusual or allergic reaction to erythropoietin, albumin, benzyl alcohol, hamster proteins, other medicines, foods, dyes, or preservatives pregnant or trying to get pregnant breast-feeding How should I use this medication? This medicine is for injection into a vein or under the skin. It is usually given by a health care professional in a hospital or clinic setting. If you get this medicine at home, you will be taught how to prepare and give this medicine. Use exactly as directed. Take your medicine at regular intervals. Do not take your medicine more often than directed. It is important that you put your used needles and syringes in a special sharps container. Do not put them in a trash can. If you do not have a sharps container, call your pharmacist or healthcare provider to get one. A special MedGuide will  be given to you by the pharmacist with each prescription and refill. Be sure to read this information carefully each time. Talk to your pediatrician regarding the use of this medicine in children. While this drug may be prescribed for selected conditions, precautions do apply. Overdosage: If you think you have taken too much of this medicine contact a poison control center or emergency room at once. NOTE: This medicine is only for you. Do not share this medicine with others. What if I miss a dose? If you miss a dose, take it as soon as you can. If it is almost time for your next dose, take only that dose. Do not take double or extra doses. What may interact with this medication? Interactions have not been studied. This list may not describe all possible interactions. Give your health care provider a list of all the medicines, herbs, non-prescription drugs, or dietary supplements you use. Also tell them if you smoke, drink alcohol, or use illegal drugs. Some items may  interact with your medicine. What should I watch for while using this medication? Your condition will be monitored carefully while you are receiving this medicine. You may need blood work done while you are taking this medicine. This medicine may cause a decrease in vitamin B6. You should make sure that you get enough vitamin B6 while you are taking this medicine. Discuss the foods you eat and the vitamins you take with your health care professional. What side effects may I notice from receiving this medication? Side effects that you should report to your doctor or health care professional as soon as possible: allergic reactions like skin rash, itching or hives, swelling of the face, lips, or tongue seizures signs and symptoms of a blood clot such as breathing problems; changes in vision; chest pain; severe, sudden headache; pain, swelling, warmth in the leg; trouble speaking; sudden numbness or weakness of the face, arm or leg signs  and symptoms of a stroke like changes in vision; confusion; trouble speaking or understanding; severe headaches; sudden numbness or weakness of the face, arm or leg; trouble walking; dizziness; loss of balance or coordination Side effects that usually do not require medical attention (report to your doctor or health care professional if they continue or are bothersome): chills cough dizziness fever headaches joint pain muscle cramps muscle pain nausea, vomiting pain, redness, or irritation at site where injected This list may not describe all possible side effects. Call your doctor for medical advice about side effects. You may report side effects to FDA at 1-800-FDA-1088. Where should I keep my medication? Keep out of the reach of children. Store in a refrigerator between 2 and 8 degrees C (36 and 46 degrees F). Do not freeze or shake. Throw away any unused portion if using a single-dose vial. Multi-dose vials can be kept in the refrigerator for up to 21 days after the initial dose. Throw away unused medicine. NOTE: This sheet is a summary. It may not cover all possible information. If you have questions about this medicine, talk to your doctor, pharmacist, or health care provider.  2022 Elsevier/Gold Standard (2017-01-05 00:00:00)

## 2021-06-02 NOTE — Progress Notes (Signed)
[facility]  [address] [place],  [name]  [address] [phone]  Clinic Day:  06/06/2021  Referring physician: Nicoletta Dress, MD  This document serves as a record of services personally performed by Maureen Macarthur Critchley, MD. It was created on their behalf by [facility], a trained medical scribe. The creation of this record is based on the scribe's personal observations and the provider's statements to them.  HISTORY OF PRESENT ILLNESS:  The patient is an 88 y.o. female with anemia secondary to chronic renal insufficiency and myelodysplasia.  As her hemoglobin has consistently been below 10, her concurrent Retacrit and Zarxio shots have been given every 2 weeks.  She comes in today for repeat clinical assessment.  Since her last visit, the patient has been doing well.  She denies having increased weakness or any overt forms of blood loss which concern her for progressive anemia.    PHYSICAL EXAM:  Blood pressure (!) 151/67, pulse 69, temperature 97.6 F (36.4 C), resp. rate 14, height 5\' 4"  (1.626 m), weight 147 lb 1.6 oz (66.7 kg), SpO2 100 %. Wt Readings from Last 3 Encounters:  06/06/21 147 lb 1.6 oz (66.7 kg)  05/27/21 144 lb 8 oz (65.5 kg)  05/13/21 142 lb 1.9 oz (64.5 kg)   Body mass index is 25.25 kg/m. Performance status (ECOG): 2 Physical Exam Constitutional:      Appearance: Normal appearance. She is not ill-appearing.  HENT:     Mouth/Throat:     Mouth: Mucous membranes are moist.     Pharynx: Oropharynx is clear. No oropharyngeal exudate or posterior oropharyngeal erythema.  Cardiovascular:     Rate and Rhythm: Normal rate and regular rhythm.     Heart sounds: No murmur heard.   No friction rub. No gallop.  Pulmonary:     Effort: Pulmonary effort is normal. No respiratory distress.     Breath sounds: Normal breath sounds. No wheezing, rhonchi or rales.  Abdominal:     General: Bowel sounds are normal. There is no  distension.     Palpations: Abdomen is soft. There is no mass.     Tenderness: There is no abdominal tenderness.  Musculoskeletal:        General: No swelling.     Right lower leg: No edema.     Left lower leg: No edema.  Lymphadenopathy:     Cervical: No cervical adenopathy.     Upper Body:     Right upper body: No supraclavicular or axillary adenopathy.     Left upper body: No supraclavicular or axillary adenopathy.     Lower Body: No right inguinal adenopathy. No left inguinal adenopathy.  Skin:    General: Skin is warm.     Coloration: Skin is not jaundiced.     Findings: No lesion or rash.  Neurological:     General: No focal deficit present.     Mental Status: She is alert and oriented to person, place, and time. Mental status is at baseline.  Psychiatric:        Mood and Affect: Mood normal.        Behavior: Behavior normal.        Thought Content: Thought content normal.    LABS:   CBC Latest Ref Rng & Units 06/06/2021 05/08/2021 04/09/2021  WBC - 4.7 6.6 7.1  Hemoglobin 12.0 - 16.0 7.0(A) 8.1(A) 8.5(A)  Hematocrit 36 - 46 23(A) 26(A) 27(A)  Platelets 150 - 399 248 250  [facility] Units 06/06/2021 11/22/2020 10/17/2020  Glucose 65 - 99 mg/dL - - -  BUN 4 - 21 59(A) 41(A) 45(A)  Creatinine 0.5 - 1.1 2.4(A) 1.8(A) 1.9(A)  Sodium 137 - 147 137 134(A) 137  Potassium 3.4 - 5.3 4.4 4.5 4.3  Chloride 99 - 108 107 101 105  CO2 13 - 22 22 24(A) 27(A)  Calcium 8.7 - 10.7 8.2(A) 8.3(A) 8.2(A)  Total Protein g/dL - 6.1 -  Total Bilirubin 0.0 - 1.2 mg/dL - - -  Alkaline Phos 25 - 125 111 103 122  AST 13 - 35 25 27 28   ALT 7 - 35 17 14 15     ASSESSMENT & PLAN:  Assessment/Plan:  An 88 y.o. female with anemia secondary to both renal insufficiency and myelodysplasia.  I am concerned as her hemoglobin is only 7 today.  However, she claims to feel fine and is not interested in a blood transfusion at this time.  However, I will increase the frequency of her shots as  her concurrent Retacrit and Zarxio shots will now be given weekly. I will see her back in 4 weeks to see how better her anemia has gotten.  The patient understands all the plans discussed today and is in agreement with them.     I, Rita Ohara, am acting as scribe for Maureen Potter, MD    I have reviewed this report as typed by the medical scribe, and it is complete and accurate.  Maureen Macarthur Critchley, MD

## 2021-06-06 ENCOUNTER — Other Ambulatory Visit: Payer: Self-pay | Admitting: Oncology

## 2021-06-06 ENCOUNTER — Other Ambulatory Visit: Payer: Self-pay

## 2021-06-06 ENCOUNTER — Inpatient Hospital Stay: Payer: Medicare PPO

## 2021-06-06 ENCOUNTER — Inpatient Hospital Stay: Payer: Medicare PPO | Admitting: Oncology

## 2021-06-06 ENCOUNTER — Other Ambulatory Visit: Payer: Self-pay | Admitting: Hematology and Oncology

## 2021-06-06 VITALS — BP 151/67 | HR 69 | Temp 97.6°F | Resp 14 | Ht 64.0 in | Wt 147.1 lb

## 2021-06-06 DIAGNOSIS — D649 Anemia, unspecified: Secondary | ICD-10-CM | POA: Diagnosis not present

## 2021-06-06 DIAGNOSIS — D631 Anemia in chronic kidney disease: Secondary | ICD-10-CM | POA: Diagnosis not present

## 2021-06-06 DIAGNOSIS — N189 Chronic kidney disease, unspecified: Secondary | ICD-10-CM | POA: Diagnosis not present

## 2021-06-06 DIAGNOSIS — D469 Myelodysplastic syndrome, unspecified: Secondary | ICD-10-CM

## 2021-06-06 LAB — BASIC METABOLIC PANEL
BUN: 59 — AB (ref 4–21)
CO2: 22 (ref 13–22)
Chloride: 107 (ref 99–108)
Creatinine: 2.4 — AB (ref 0.5–1.1)
Glucose: 150
Potassium: 4.4 (ref 3.4–5.3)
Sodium: 137 (ref 137–147)

## 2021-06-06 LAB — CBC AND DIFFERENTIAL
HCT: 23 — AB (ref 36–46)
Hemoglobin: 7 — AB (ref 12.0–16.0)
Neutrophils Absolute: 3.67
Platelets: 248 (ref 150–399)
WBC: 4.7

## 2021-06-06 LAB — CORRECTED CALCIUM (CC13): Calcium, Corrected: 8.9

## 2021-06-06 LAB — HEPATIC FUNCTION PANEL
ALT: 17 (ref 7–35)
AST: 25 (ref 13–35)
Alkaline Phosphatase: 111 (ref 25–125)
Bilirubin, Total: 0.6

## 2021-06-06 LAB — COMPREHENSIVE METABOLIC PANEL
Albumin: 3.3 — AB (ref 3.5–5.0)
Calcium: 8.2 — AB (ref 8.7–10.7)

## 2021-06-06 LAB — CBC
Absolute Lymphocytes: 0.56 — AB (ref 0.65–4.75)
MCV: 92 (ref 81–99)
RBC: 2.51 — AB (ref 3.87–5.11)

## 2021-06-08 ENCOUNTER — Encounter: Payer: Self-pay | Admitting: Oncology

## 2021-06-09 ENCOUNTER — Encounter: Payer: Self-pay | Admitting: Oncology

## 2021-06-10 ENCOUNTER — Other Ambulatory Visit: Payer: Self-pay

## 2021-06-10 ENCOUNTER — Inpatient Hospital Stay: Payer: Medicare PPO

## 2021-06-10 ENCOUNTER — Other Ambulatory Visit: Payer: Self-pay | Admitting: Oncology

## 2021-06-10 VITALS — BP 141/58 | HR 60 | Temp 97.5°F | Resp 18 | Ht 64.0 in | Wt 149.2 lb

## 2021-06-10 DIAGNOSIS — N184 Chronic kidney disease, stage 4 (severe): Secondary | ICD-10-CM

## 2021-06-10 DIAGNOSIS — D631 Anemia in chronic kidney disease: Secondary | ICD-10-CM | POA: Diagnosis not present

## 2021-06-10 DIAGNOSIS — Z79899 Other long term (current) drug therapy: Secondary | ICD-10-CM | POA: Diagnosis not present

## 2021-06-10 DIAGNOSIS — D469 Myelodysplastic syndrome, unspecified: Secondary | ICD-10-CM

## 2021-06-10 DIAGNOSIS — D460 Refractory anemia without ring sideroblasts, so stated: Secondary | ICD-10-CM | POA: Diagnosis not present

## 2021-06-10 MED ORDER — FILGRASTIM-SNDZ 480 MCG/0.8ML IJ SOSY
480.0000 ug | PREFILLED_SYRINGE | Freq: Once | INTRAMUSCULAR | Status: AC
  Administered 2021-06-10: 480 ug via SUBCUTANEOUS
  Filled 2021-06-10: qty 0.8

## 2021-06-10 MED ORDER — EPOETIN ALFA-EPBX 40000 UNIT/ML IJ SOLN
40000.0000 [IU] | Freq: Once | INTRAMUSCULAR | Status: AC
  Administered 2021-06-10: 40000 [IU] via SUBCUTANEOUS
  Filled 2021-06-10: qty 1

## 2021-06-10 NOTE — Progress Notes (Signed)
1427: PT STABLE AT TIME OF DISCHARGE  

## 2021-06-10 NOTE — Patient Instructions (Signed)
Epoetin Alfa injection °What is this medication? °EPOETIN ALFA (e POE e tin AL fa) helps your body make more red blood cells. This medicine is used to treat anemia caused by chronic kidney disease, cancer chemotherapy, or HIV-therapy. It may also be used before surgery if you have anemia. °This medicine may be used for other purposes; ask your health care provider or pharmacist if you have questions. °COMMON BRAND NAME(S): Epogen, Procrit, Retacrit °What should I tell my care team before I take this medication? °They need to know if you have any of these conditions: °cancer °heart disease °high blood pressure °history of blood clots °history of stroke °low levels of folate, iron, or vitamin B12 in the blood °seizures °an unusual or allergic reaction to erythropoietin, albumin, benzyl alcohol, hamster proteins, other medicines, foods, dyes, or preservatives °pregnant or trying to get pregnant °breast-feeding °How should I use this medication? °This medicine is for injection into a vein or under the skin. It is usually given by a health care professional in a hospital or clinic setting. °If you get this medicine at home, you will be taught how to prepare and give this medicine. Use exactly as directed. Take your medicine at regular intervals. Do not take your medicine more often than directed. °It is important that you put your used needles and syringes in a special sharps container. Do not put them in a trash can. If you do not have a sharps container, call your pharmacist or healthcare provider to get one. °A special MedGuide will be given to you by the pharmacist with each prescription and refill. Be sure to read this information carefully each time. °Talk to your pediatrician regarding the use of this medicine in children. While this drug may be prescribed for selected conditions, precautions do apply. °Overdosage: If you think you have taken too much of this medicine contact a poison control center or emergency  room at once. °NOTE: This medicine is only for you. Do not share this medicine with others. °What if I miss a dose? °If you miss a dose, take it as soon as you can. If it is almost time for your next dose, take only that dose. Do not take double or extra doses. °What may interact with this medication? °Interactions have not been studied. °This list may not describe all possible interactions. Give your health care provider a list of all the medicines, herbs, non-prescription drugs, or dietary supplements you use. Also tell them if you smoke, drink alcohol, or use illegal drugs. Some items may interact with your medicine. °What should I watch for while using this medication? °Your condition will be monitored carefully while you are receiving this medicine. °You may need blood work done while you are taking this medicine. °This medicine may cause a decrease in vitamin B6. You should make sure that you get enough vitamin B6 while you are taking this medicine. Discuss the foods you eat and the vitamins you take with your health care professional. °What side effects may I notice from receiving this medication? °Side effects that you should report to your doctor or health care professional as soon as possible: °allergic reactions like skin rash, itching or hives, swelling of the face, lips, or tongue °seizures °signs and symptoms of a blood clot such as breathing problems; changes in vision; chest pain; severe, sudden headache; pain, swelling, warmth in the leg; trouble speaking; sudden numbness or weakness of the face, arm or leg °signs and symptoms of a stroke like   changes in vision; confusion; trouble speaking or understanding; severe headaches; sudden numbness or weakness of the face, arm or leg; trouble walking; dizziness; loss of balance or coordination Side effects that usually do not require medical attention (report to your doctor or health care professional if they continue or are  bothersome): chills cough dizziness fever headaches joint pain muscle cramps muscle pain nausea, vomiting pain, redness, or irritation at site where injected This list may not describe all possible side effects. Call your doctor for medical advice about side effects. You may report side effects to FDA at 1-800-FDA-1088. Where should I keep my medication? Keep out of the reach of children. Store in a refrigerator between 2 and 8 degrees C (36 and 46 degrees F). Do not freeze or shake. Throw away any unused portion if using a single-dose vial. Multi-dose vials can be kept in the refrigerator for up to 21 days after the initial dose. Throw away unused medicine. NOTE: This sheet is a summary. It may not cover all possible information. If you have questions about this medicine, talk to your doctor, pharmacist, or health care provider.  2022 Elsevier/Gold Standard (2017-01-05 00:00:00) Filgrastim, G-CSF injection What is this medication? FILGRASTIM, G-CSF (fil GRA stim) is a granulocyte colony-stimulating factor that stimulates the growth of neutrophils, a type of white blood cell (WBC) important in the body's fight against infection. It is used to reduce the incidence of fever and infection in patients with certain types of cancer who are receiving chemotherapy that affects the bone marrow, to stimulate blood cell production for removal of WBCs from the body prior to a bone marrow transplantation, to reduce the incidence of fever and infection in patients who have severe chronic neutropenia, and to improve survival outcomes following high-dose radiation exposure that is toxic to the bone marrow. This medicine may be used for other purposes; ask your health care provider or pharmacist if you have questions. COMMON BRAND NAME(S): Neupogen, Nivestym, Releuko, Zarxio What should I tell my care team before I take this medication? They need to know if you have any of these conditions: kidney  disease latex allergy ongoing radiation therapy sickle cell disease an unusual or allergic reaction to filgrastim, pegfilgrastim, other medicines, foods, dyes, or preservatives pregnant or trying to get pregnant breast-feeding How should I use this medication? This medicine is for injection under the skin or infusion into a vein. As an infusion into a vein, it is usually given by a health care professional in a hospital or clinic setting. If you get this medicine at home, you will be taught how to prepare and give this medicine. Refer to the Instructions for Use that come with your medication packaging. Use exactly as directed. Take your medicine at regular intervals. Do not take your medicine more often than directed. It is important that you put your used needles and syringes in a special sharps container. Do not put them in a trash can. If you do not have a sharps container, call your pharmacist or healthcare provider to get one. Talk to your pediatrician regarding the use of this medicine in children. While this drug may be prescribed for children as young as 7 months for selected conditions, precautions do apply. Overdosage: If you think you have taken too much of this medicine contact a poison control center or emergency room at once. NOTE: This medicine is only for you. Do not share this medicine with others. What if I miss a dose? It is important not  to miss your dose. Call your doctor or health care professional if you miss a dose. What may interact with this medication? This medicine may interact with the following medications: medicines that may cause a release of neutrophils, such as lithium This list may not describe all possible interactions. Give your health care provider a list of all the medicines, herbs, non-prescription drugs, or dietary supplements you use. Also tell them if you smoke, drink alcohol, or use illegal drugs. Some items may interact with your medicine. What should  I watch for while using this medication? Your condition will be monitored carefully while you are receiving this medicine. You may need blood work done while you are taking this medicine. Talk to your health care provider about your risk of cancer. You may be more at risk for certain types of cancer if you take this medicine. What side effects may I notice from receiving this medication? Side effects that you should report to your doctor or health care professional as soon as possible: allergic reactions like skin rash, itching or hives, swelling of the face, lips, or tongue back pain dizziness or feeling faint fever pain, redness, or irritation at site where injected pinpoint red spots on the skin shortness of breath or breathing problems signs and symptoms of kidney injury like trouble passing urine, change in the amount of urine, or red or dark-brown urine stomach or side pain, or pain at the shoulder swelling tiredness unusual bleeding or bruising Side effects that usually do not require medical attention (report to your doctor or health care professional if they continue or are bothersome): bone pain cough diarrhea hair loss headache muscle pain This list may not describe all possible side effects. Call your doctor for medical advice about side effects. You may report side effects to FDA at 1-800-FDA-1088. Where should I keep my medication? Keep out of the reach of children. Store in a refrigerator between 2 and 8 degrees C (36 and 46 degrees F). Do not freeze. Keep in carton to protect from light. Throw away this medicine if vials or syringes are left out of the refrigerator for more than 24 hours. Throw away any unused medicine after the expiration date. NOTE: This sheet is a summary. It may not cover all possible information. If you have questions about this medicine, talk to your doctor, pharmacist, or health care provider.  2022 Elsevier/Gold Standard (2021-01-21  00:00:00)

## 2021-06-17 ENCOUNTER — Other Ambulatory Visit: Payer: Self-pay

## 2021-06-17 ENCOUNTER — Inpatient Hospital Stay: Payer: Medicare PPO | Attending: Oncology

## 2021-06-17 VITALS — BP 150/74 | HR 86 | Temp 97.5°F | Resp 18 | Ht 64.0 in | Wt 148.1 lb

## 2021-06-17 DIAGNOSIS — D460 Refractory anemia without ring sideroblasts, so stated: Secondary | ICD-10-CM | POA: Diagnosis not present

## 2021-06-17 DIAGNOSIS — D631 Anemia in chronic kidney disease: Secondary | ICD-10-CM | POA: Insufficient documentation

## 2021-06-17 DIAGNOSIS — Z79899 Other long term (current) drug therapy: Secondary | ICD-10-CM | POA: Insufficient documentation

## 2021-06-17 DIAGNOSIS — N184 Chronic kidney disease, stage 4 (severe): Secondary | ICD-10-CM | POA: Insufficient documentation

## 2021-06-17 MED ORDER — EPOETIN ALFA-EPBX 40000 UNIT/ML IJ SOLN
40000.0000 [IU] | Freq: Once | INTRAMUSCULAR | Status: AC
  Administered 2021-06-17: 40000 [IU] via SUBCUTANEOUS
  Filled 2021-06-17: qty 1

## 2021-06-17 MED ORDER — FILGRASTIM-SNDZ 480 MCG/0.8ML IJ SOSY
480.0000 ug | PREFILLED_SYRINGE | Freq: Once | INTRAMUSCULAR | Status: AC
  Administered 2021-06-17: 480 ug via SUBCUTANEOUS
  Filled 2021-06-17: qty 0.8

## 2021-06-17 NOTE — Patient Instructions (Signed)
Filgrastim, G-CSF injection °What is this medication? °FILGRASTIM, G-CSF (fil GRA stim) is a granulocyte colony-stimulating factor that stimulates the growth of neutrophils, a type of white blood cell (WBC) important in the body's fight against infection. It is used to reduce the incidence of fever and infection in patients with certain types of cancer who are receiving chemotherapy that affects the bone marrow, to stimulate blood cell production for removal of WBCs from the body prior to a bone marrow transplantation, to reduce the incidence of fever and infection in patients who have severe chronic neutropenia, and to improve survival outcomes following high-dose radiation exposure that is toxic to the bone marrow. °This medicine may be used for other purposes; ask your health care provider or pharmacist if you have questions. °COMMON BRAND NAME(S): Neupogen, Nivestym, Releuko, Zarxio °What should I tell my care team before I take this medication? °They need to know if you have any of these conditions: °kidney disease °latex allergy °ongoing radiation therapy °sickle cell disease °an unusual or allergic reaction to filgrastim, pegfilgrastim, other medicines, foods, dyes, or preservatives °pregnant or trying to get pregnant °breast-feeding °How should I use this medication? °This medicine is for injection under the skin or infusion into a vein. As an infusion into a vein, it is usually given by a health care professional in a hospital or clinic setting. If you get this medicine at home, you will be taught how to prepare and give this medicine. Refer to the Instructions for Use that come with your medication packaging. Use exactly as directed. Take your medicine at regular intervals. Do not take your medicine more often than directed. °It is important that you put your used needles and syringes in a special sharps container. Do not put them in a trash can. If you do not have a sharps container, call your pharmacist  or healthcare provider to get one. °Talk to your pediatrician regarding the use of this medicine in children. While this drug may be prescribed for children as young as 7 months for selected conditions, precautions do apply. °Overdosage: If you think you have taken too much of this medicine contact a poison control center or emergency room at once. °NOTE: This medicine is only for you. Do not share this medicine with others. °What if I miss a dose? °It is important not to miss your dose. Call your doctor or health care professional if you miss a dose. °What may interact with this medication? °This medicine may interact with the following medications: °medicines that may cause a release of neutrophils, such as lithium °This list may not describe all possible interactions. Give your health care provider a list of all the medicines, herbs, non-prescription drugs, or dietary supplements you use. Also tell them if you smoke, drink alcohol, or use illegal drugs. Some items may interact with your medicine. °What should I watch for while using this medication? °Your condition will be monitored carefully while you are receiving this medicine. °You may need blood work done while you are taking this medicine. °Talk to your health care provider about your risk of cancer. You may be more at risk for certain types of cancer if you take this medicine. °What side effects may I notice from receiving this medication? °Side effects that you should report to your doctor or health care professional as soon as possible: °allergic reactions like skin rash, itching or hives, swelling of the face, lips, or tongue °back pain °dizziness or feeling faint °fever °pain, redness, or   irritation at site where injected pinpoint red spots on the skin shortness of breath or breathing problems signs and symptoms of kidney injury like trouble passing urine, change in the amount of urine, or red or dark-brown urine stomach or side pain, or pain at  the shoulder swelling tiredness unusual bleeding or bruising Side effects that usually do not require medical attention (report to your doctor or health care professional if they continue or are bothersome): bone pain cough diarrhea hair loss headache muscle pain This list may not describe all possible side effects. Call your doctor for medical advice about side effects. You may report side effects to FDA at 1-800-FDA-1088. Where should I keep my medication? Keep out of the reach of children. Store in a refrigerator between 2 and 8 degrees C (36 and 46 degrees F). Do not freeze. Keep in carton to protect from light. Throw away this medicine if vials or syringes are left out of the refrigerator for more than 24 hours. Throw away any unused medicine after the expiration date. NOTE: This sheet is a summary. It may not cover all possible information. If you have questions about this medicine, talk to your doctor, pharmacist, or health care provider.  2022 Elsevier/Gold Standard (2021-01-21 00:00:00) Epoetin Alfa injection What is this medication? EPOETIN ALFA (e POE e tin AL fa) helps your body make more red blood cells. This medicine is used to treat anemia caused by chronic kidney disease, cancer chemotherapy, or HIV-therapy. It may also be used before surgery if you have anemia. This medicine may be used for other purposes; ask your health care provider or pharmacist if you have questions. COMMON BRAND NAME(S): Epogen, Procrit, Retacrit What should I tell my care team before I take this medication? They need to know if you have any of these conditions: cancer heart disease high blood pressure history of blood clots history of stroke low levels of folate, iron, or vitamin B12 in the blood seizures an unusual or allergic reaction to erythropoietin, albumin, benzyl alcohol, hamster proteins, other medicines, foods, dyes, or preservatives pregnant or trying to get  pregnant breast-feeding How should I use this medication? This medicine is for injection into a vein or under the skin. It is usually given by a health care professional in a hospital or clinic setting. If you get this medicine at home, you will be taught how to prepare and give this medicine. Use exactly as directed. Take your medicine at regular intervals. Do not take your medicine more often than directed. It is important that you put your used needles and syringes in a special sharps container. Do not put them in a trash can. If you do not have a sharps container, call your pharmacist or healthcare provider to get one. A special MedGuide will be given to you by the pharmacist with each prescription and refill. Be sure to read this information carefully each time. Talk to your pediatrician regarding the use of this medicine in children. While this drug may be prescribed for selected conditions, precautions do apply. Overdosage: If you think you have taken too much of this medicine contact a poison control center or emergency room at once. NOTE: This medicine is only for you. Do not share this medicine with others. What if I miss a dose? If you miss a dose, take it as soon as you can. If it is almost time for your next dose, take only that dose. Do not take double or extra doses. What may interact  with this medication? Interactions have not been studied. This list may not describe all possible interactions. Give your health care provider a list of all the medicines, herbs, non-prescription drugs, or dietary supplements you use. Also tell them if you smoke, drink alcohol, or use illegal drugs. Some items may interact with your medicine. What should I watch for while using this medication? Your condition will be monitored carefully while you are receiving this medicine. You may need blood work done while you are taking this medicine. This medicine may cause a decrease in vitamin B6. You should make  sure that you get enough vitamin B6 while you are taking this medicine. Discuss the foods you eat and the vitamins you take with your health care professional. What side effects may I notice from receiving this medication? Side effects that you should report to your doctor or health care professional as soon as possible: allergic reactions like skin rash, itching or hives, swelling of the face, lips, or tongue seizures signs and symptoms of a blood clot such as breathing problems; changes in vision; chest pain; severe, sudden headache; pain, swelling, warmth in the leg; trouble speaking; sudden numbness or weakness of the face, arm or leg signs and symptoms of a stroke like changes in vision; confusion; trouble speaking or understanding; severe headaches; sudden numbness or weakness of the face, arm or leg; trouble walking; dizziness; loss of balance or coordination Side effects that usually do not require medical attention (report to your doctor or health care professional if they continue or are bothersome): chills cough dizziness fever headaches joint pain muscle cramps muscle pain nausea, vomiting pain, redness, or irritation at site where injected This list may not describe all possible side effects. Call your doctor for medical advice about side effects. You may report side effects to FDA at 1-800-FDA-1088. Where should I keep my medication? Keep out of the reach of children. Store in a refrigerator between 2 and 8 degrees C (36 and 46 degrees F). Do not freeze or shake. Throw away any unused portion if using a single-dose vial. Multi-dose vials can be kept in the refrigerator for up to 21 days after the initial dose. Throw away unused medicine. NOTE: This sheet is a summary. It may not cover all possible information. If you have questions about this medicine, talk to your doctor, pharmacist, or health care provider.  2022 Elsevier/Gold Standard (2017-01-05 00:00:00)

## 2021-06-19 ENCOUNTER — Telehealth: Payer: Self-pay | Admitting: *Deleted

## 2021-06-19 NOTE — Telephone Encounter (Signed)
[name] from [facility] ([phone])-called and left a message stating that she needed verbal orders for the patient and to re-cert for 2 times a week for wound care of the plantar aspect of both feet and to use the current wound care orders and I called the nurse back and stated that it was ok to do per Dr Cannon Kettle. Lattie Haw

## 2021-06-20 ENCOUNTER — Inpatient Hospital Stay: Payer: Medicare PPO | Attending: Oncology

## 2021-06-20 ENCOUNTER — Other Ambulatory Visit: Payer: Self-pay | Admitting: Hematology and Oncology

## 2021-06-20 DIAGNOSIS — D649 Anemia, unspecified: Secondary | ICD-10-CM

## 2021-06-20 DIAGNOSIS — D469 Myelodysplastic syndrome, unspecified: Secondary | ICD-10-CM | POA: Diagnosis not present

## 2021-06-20 DIAGNOSIS — Z79899 Other long term (current) drug therapy: Secondary | ICD-10-CM | POA: Insufficient documentation

## 2021-06-20 DIAGNOSIS — D460 Refractory anemia without ring sideroblasts, so stated: Secondary | ICD-10-CM | POA: Insufficient documentation

## 2021-06-20 DIAGNOSIS — D631 Anemia in chronic kidney disease: Secondary | ICD-10-CM | POA: Insufficient documentation

## 2021-06-20 DIAGNOSIS — N184 Chronic kidney disease, stage 4 (severe): Secondary | ICD-10-CM | POA: Insufficient documentation

## 2021-06-20 LAB — CBC AND DIFFERENTIAL
HCT: 28 — AB (ref 36–46)
Hemoglobin: 8.3 — AB (ref 12.0–16.0)
Neutrophils Absolute: 17.66
Platelets: 204 (ref 150–399)
WBC: 19.2

## 2021-06-20 LAB — CBC
MCV: 97 (ref 81–99)
RBC: 2.83 — AB (ref 3.87–5.11)

## 2021-06-24 ENCOUNTER — Other Ambulatory Visit: Payer: Self-pay

## 2021-06-24 ENCOUNTER — Inpatient Hospital Stay: Payer: Medicare PPO

## 2021-06-24 VITALS — BP 132/57 | HR 65 | Temp 98.1°F | Resp 18 | Ht 64.0 in | Wt 145.0 lb

## 2021-06-24 DIAGNOSIS — D638 Anemia in other chronic diseases classified elsewhere: Secondary | ICD-10-CM

## 2021-06-24 DIAGNOSIS — N184 Chronic kidney disease, stage 4 (severe): Secondary | ICD-10-CM

## 2021-06-24 DIAGNOSIS — D460 Refractory anemia without ring sideroblasts, so stated: Secondary | ICD-10-CM | POA: Diagnosis not present

## 2021-06-24 DIAGNOSIS — D631 Anemia in chronic kidney disease: Secondary | ICD-10-CM | POA: Diagnosis not present

## 2021-06-24 DIAGNOSIS — Z79899 Other long term (current) drug therapy: Secondary | ICD-10-CM | POA: Diagnosis not present

## 2021-06-24 MED ORDER — FILGRASTIM-SNDZ 480 MCG/0.8ML IJ SOSY
480.0000 ug | PREFILLED_SYRINGE | Freq: Once | INTRAMUSCULAR | Status: AC
  Administered 2021-06-24: 480 ug via SUBCUTANEOUS
  Filled 2021-06-24: qty 0.8

## 2021-06-24 MED ORDER — EPOETIN ALFA-EPBX 40000 UNIT/ML IJ SOLN
40000.0000 [IU] | Freq: Once | INTRAMUSCULAR | Status: AC
  Administered 2021-06-24: 40000 [IU] via SUBCUTANEOUS
  Filled 2021-06-24: qty 1

## 2021-06-24 NOTE — Patient Instructions (Signed)
Epoetin Alfa injection °What is this medication? °EPOETIN ALFA (e POE e tin AL fa) helps your body make more red blood cells. This medicine is used to treat anemia caused by chronic kidney disease, cancer chemotherapy, or HIV-therapy. It may also be used before surgery if you have anemia. °This medicine may be used for other purposes; ask your health care provider or pharmacist if you have questions. °COMMON BRAND NAME(S): Epogen, Procrit, Retacrit °What should I tell my care team before I take this medication? °They need to know if you have any of these conditions: °cancer °heart disease °high blood pressure °history of blood clots °history of stroke °low levels of folate, iron, or vitamin B12 in the blood °seizures °an unusual or allergic reaction to erythropoietin, albumin, benzyl alcohol, hamster proteins, other medicines, foods, dyes, or preservatives °pregnant or trying to get pregnant °breast-feeding °How should I use this medication? °This medicine is for injection into a vein or under the skin. It is usually given by a health care professional in a hospital or clinic setting. °If you get this medicine at home, you will be taught how to prepare and give this medicine. Use exactly as directed. Take your medicine at regular intervals. Do not take your medicine more often than directed. °It is important that you put your used needles and syringes in a special sharps container. Do not put them in a trash can. If you do not have a sharps container, call your pharmacist or healthcare provider to get one. °A special MedGuide will be given to you by the pharmacist with each prescription and refill. Be sure to read this information carefully each time. °Talk to your pediatrician regarding the use of this medicine in children. While this drug may be prescribed for selected conditions, precautions do apply. °Overdosage: If you think you have taken too much of this medicine contact a poison control center or emergency  room at once. °NOTE: This medicine is only for you. Do not share this medicine with others. °What if I miss a dose? °If you miss a dose, take it as soon as you can. If it is almost time for your next dose, take only that dose. Do not take double or extra doses. °What may interact with this medication? °Interactions have not been studied. °This list may not describe all possible interactions. Give your health care provider a list of all the medicines, herbs, non-prescription drugs, or dietary supplements you use. Also tell them if you smoke, drink alcohol, or use illegal drugs. Some items may interact with your medicine. °What should I watch for while using this medication? °Your condition will be monitored carefully while you are receiving this medicine. °You may need blood work done while you are taking this medicine. °This medicine may cause a decrease in vitamin B6. You should make sure that you get enough vitamin B6 while you are taking this medicine. Discuss the foods you eat and the vitamins you take with your health care professional. °What side effects may I notice from receiving this medication? °Side effects that you should report to your doctor or health care professional as soon as possible: °allergic reactions like skin rash, itching or hives, swelling of the face, lips, or tongue °seizures °signs and symptoms of a blood clot such as breathing problems; changes in vision; chest pain; severe, sudden headache; pain, swelling, warmth in the leg; trouble speaking; sudden numbness or weakness of the face, arm or leg °signs and symptoms of a stroke like   changes in vision; confusion; trouble speaking or understanding; severe headaches; sudden numbness or weakness of the face, arm or leg; trouble walking; dizziness; loss of balance or coordination Side effects that usually do not require medical attention (report to your doctor or health care professional if they continue or are  bothersome): chills cough dizziness fever headaches joint pain muscle cramps muscle pain nausea, vomiting pain, redness, or irritation at site where injected This list may not describe all possible side effects. Call your doctor for medical advice about side effects. You may report side effects to FDA at 1-800-FDA-1088. Where should I keep my medication? Keep out of the reach of children. Store in a refrigerator between 2 and 8 degrees C (36 and 46 degrees F). Do not freeze or shake. Throw away any unused portion if using a single-dose vial. Multi-dose vials can be kept in the refrigerator for up to 21 days after the initial dose. Throw away unused medicine. NOTE: This sheet is a summary. It may not cover all possible information. If you have questions about this medicine, talk to your doctor, pharmacist, or health care provider.  2022 Elsevier/Gold Standard (2017-01-05 00:00:00) Filgrastim, G-CSF injection What is this medication? FILGRASTIM, G-CSF (fil GRA stim) is a granulocyte colony-stimulating factor that stimulates the growth of neutrophils, a type of white blood cell (WBC) important in the body's fight against infection. It is used to reduce the incidence of fever and infection in patients with certain types of cancer who are receiving chemotherapy that affects the bone marrow, to stimulate blood cell production for removal of WBCs from the body prior to a bone marrow transplantation, to reduce the incidence of fever and infection in patients who have severe chronic neutropenia, and to improve survival outcomes following high-dose radiation exposure that is toxic to the bone marrow. This medicine may be used for other purposes; ask your health care provider or pharmacist if you have questions. COMMON BRAND NAME(S): Neupogen, Nivestym, Releuko, Zarxio What should I tell my care team before I take this medication? They need to know if you have any of these conditions: kidney  disease latex allergy ongoing radiation therapy sickle cell disease an unusual or allergic reaction to filgrastim, pegfilgrastim, other medicines, foods, dyes, or preservatives pregnant or trying to get pregnant breast-feeding How should I use this medication? This medicine is for injection under the skin or infusion into a vein. As an infusion into a vein, it is usually given by a health care professional in a hospital or clinic setting. If you get this medicine at home, you will be taught how to prepare and give this medicine. Refer to the Instructions for Use that come with your medication packaging. Use exactly as directed. Take your medicine at regular intervals. Do not take your medicine more often than directed. It is important that you put your used needles and syringes in a special sharps container. Do not put them in a trash can. If you do not have a sharps container, call your pharmacist or healthcare provider to get one. Talk to your pediatrician regarding the use of this medicine in children. While this drug may be prescribed for children as young as 7 months for selected conditions, precautions do apply. Overdosage: If you think you have taken too much of this medicine contact a poison control center or emergency room at once. NOTE: This medicine is only for you. Do not share this medicine with others. What if I miss a dose? It is important not  to miss your dose. Call your doctor or health care professional if you miss a dose. What may interact with this medication? This medicine may interact with the following medications: medicines that may cause a release of neutrophils, such as lithium This list may not describe all possible interactions. Give your health care provider a list of all the medicines, herbs, non-prescription drugs, or dietary supplements you use. Also tell them if you smoke, drink alcohol, or use illegal drugs. Some items may interact with your medicine. What should  I watch for while using this medication? Your condition will be monitored carefully while you are receiving this medicine. You may need blood work done while you are taking this medicine. Talk to your health care provider about your risk of cancer. You may be more at risk for certain types of cancer if you take this medicine. What side effects may I notice from receiving this medication? Side effects that you should report to your doctor or health care professional as soon as possible: allergic reactions like skin rash, itching or hives, swelling of the face, lips, or tongue back pain dizziness or feeling faint fever pain, redness, or irritation at site where injected pinpoint red spots on the skin shortness of breath or breathing problems signs and symptoms of kidney injury like trouble passing urine, change in the amount of urine, or red or dark-brown urine stomach or side pain, or pain at the shoulder swelling tiredness unusual bleeding or bruising Side effects that usually do not require medical attention (report to your doctor or health care professional if they continue or are bothersome): bone pain cough diarrhea hair loss headache muscle pain This list may not describe all possible side effects. Call your doctor for medical advice about side effects. You may report side effects to FDA at 1-800-FDA-1088. Where should I keep my medication? Keep out of the reach of children. Store in a refrigerator between 2 and 8 degrees C (36 and 46 degrees F). Do not freeze. Keep in carton to protect from light. Throw away this medicine if vials or syringes are left out of the refrigerator for more than 24 hours. Throw away any unused medicine after the expiration date. NOTE: This sheet is a summary. It may not cover all possible information. If you have questions about this medicine, talk to your doctor, pharmacist, or health care provider.  2022 Elsevier/Gold Standard (2021-01-21  00:00:00)

## 2021-07-01 ENCOUNTER — Telehealth: Payer: Self-pay

## 2021-07-01 ENCOUNTER — Inpatient Hospital Stay: Payer: Medicare PPO

## 2021-07-01 ENCOUNTER — Other Ambulatory Visit: Payer: Self-pay

## 2021-07-01 VITALS — BP 153/70 | HR 64 | Temp 98.2°F | Resp 18 | Ht 64.0 in | Wt 144.1 lb

## 2021-07-01 DIAGNOSIS — D460 Refractory anemia without ring sideroblasts, so stated: Secondary | ICD-10-CM | POA: Diagnosis not present

## 2021-07-01 DIAGNOSIS — N184 Chronic kidney disease, stage 4 (severe): Secondary | ICD-10-CM | POA: Diagnosis not present

## 2021-07-01 DIAGNOSIS — Z79899 Other long term (current) drug therapy: Secondary | ICD-10-CM | POA: Diagnosis not present

## 2021-07-01 DIAGNOSIS — D631 Anemia in chronic kidney disease: Secondary | ICD-10-CM | POA: Diagnosis not present

## 2021-07-01 MED ORDER — FILGRASTIM-SNDZ 480 MCG/0.8ML IJ SOSY
480.0000 ug | PREFILLED_SYRINGE | Freq: Once | INTRAMUSCULAR | Status: AC
  Administered 2021-07-01: 480 ug via SUBCUTANEOUS
  Filled 2021-07-01: qty 0.8

## 2021-07-01 MED ORDER — EPOETIN ALFA-EPBX 40000 UNIT/ML IJ SOLN
40000.0000 [IU] | Freq: Once | INTRAMUSCULAR | Status: AC
  Administered 2021-07-01: 40000 [IU] via SUBCUTANEOUS
  Filled 2021-07-01: qty 1

## 2021-07-01 NOTE — Patient Instructions (Signed)
Filgrastim, G-CSF injection °What is this medication? °FILGRASTIM, G-CSF (fil GRA stim) is a granulocyte colony-stimulating factor that stimulates the growth of neutrophils, a type of white blood cell (WBC) important in the body's fight against infection. It is used to reduce the incidence of fever and infection in patients with certain types of cancer who are receiving chemotherapy that affects the bone marrow, to stimulate blood cell production for removal of WBCs from the body prior to a bone marrow transplantation, to reduce the incidence of fever and infection in patients who have severe chronic neutropenia, and to improve survival outcomes following high-dose radiation exposure that is toxic to the bone marrow. °This medicine may be used for other purposes; ask your health care provider or pharmacist if you have questions. °COMMON BRAND NAME(S): Neupogen, Nivestym, Releuko, Zarxio °What should I tell my care team before I take this medication? °They need to know if you have any of these conditions: °kidney disease °latex allergy °ongoing radiation therapy °sickle cell disease °an unusual or allergic reaction to filgrastim, pegfilgrastim, other medicines, foods, dyes, or preservatives °pregnant or trying to get pregnant °breast-feeding °How should I use this medication? °This medicine is for injection under the skin or infusion into a vein. As an infusion into a vein, it is usually given by a health care professional in a hospital or clinic setting. If you get this medicine at home, you will be taught how to prepare and give this medicine. Refer to the Instructions for Use that come with your medication packaging. Use exactly as directed. Take your medicine at regular intervals. Do not take your medicine more often than directed. °It is important that you put your used needles and syringes in a special sharps container. Do not put them in a trash can. If you do not have a sharps container, call your pharmacist  or healthcare provider to get one. °Talk to your pediatrician regarding the use of this medicine in children. While this drug may be prescribed for children as young as 7 months for selected conditions, precautions do apply. °Overdosage: If you think you have taken too much of this medicine contact a poison control center or emergency room at once. °NOTE: This medicine is only for you. Do not share this medicine with others. °What if I miss a dose? °It is important not to miss your dose. Call your doctor or health care professional if you miss a dose. °What may interact with this medication? °This medicine may interact with the following medications: °medicines that may cause a release of neutrophils, such as lithium °This list may not describe all possible interactions. Give your health care provider a list of all the medicines, herbs, non-prescription drugs, or dietary supplements you use. Also tell them if you smoke, drink alcohol, or use illegal drugs. Some items may interact with your medicine. °What should I watch for while using this medication? °Your condition will be monitored carefully while you are receiving this medicine. °You may need blood work done while you are taking this medicine. °Talk to your health care provider about your risk of cancer. You may be more at risk for certain types of cancer if you take this medicine. °What side effects may I notice from receiving this medication? °Side effects that you should report to your doctor or health care professional as soon as possible: °allergic reactions like skin rash, itching or hives, swelling of the face, lips, or tongue °back pain °dizziness or feeling faint °fever °pain, redness, or   irritation at site where injected pinpoint red spots on the skin shortness of breath or breathing problems signs and symptoms of kidney injury like trouble passing urine, change in the amount of urine, or red or dark-brown urine stomach or side pain, or pain at  the shoulder swelling tiredness unusual bleeding or bruising Side effects that usually do not require medical attention (report to your doctor or health care professional if they continue or are bothersome): bone pain cough diarrhea hair loss headache muscle pain This list may not describe all possible side effects. Call your doctor for medical advice about side effects. You may report side effects to FDA at 1-800-FDA-1088. Where should I keep my medication? Keep out of the reach of children. Store in a refrigerator between 2 and 8 degrees C (36 and 46 degrees F). Do not freeze. Keep in carton to protect from light. Throw away this medicine if vials or syringes are left out of the refrigerator for more than 24 hours. Throw away any unused medicine after the expiration date. NOTE: This sheet is a summary. It may not cover all possible information. If you have questions about this medicine, talk to your doctor, pharmacist, or health care provider.  2022 Elsevier/Gold Standard (2021-01-21 00:00:00) Epoetin Alfa injection What is this medication? EPOETIN ALFA (e POE e tin AL fa) helps your body make more red blood cells. This medicine is used to treat anemia caused by chronic kidney disease, cancer chemotherapy, or HIV-therapy. It may also be used before surgery if you have anemia. This medicine may be used for other purposes; ask your health care provider or pharmacist if you have questions. COMMON BRAND NAME(S): Epogen, Procrit, Retacrit What should I tell my care team before I take this medication? They need to know if you have any of these conditions: cancer heart disease high blood pressure history of blood clots history of stroke low levels of folate, iron, or vitamin B12 in the blood seizures an unusual or allergic reaction to erythropoietin, albumin, benzyl alcohol, hamster proteins, other medicines, foods, dyes, or preservatives pregnant or trying to get  pregnant breast-feeding How should I use this medication? This medicine is for injection into a vein or under the skin. It is usually given by a health care professional in a hospital or clinic setting. If you get this medicine at home, you will be taught how to prepare and give this medicine. Use exactly as directed. Take your medicine at regular intervals. Do not take your medicine more often than directed. It is important that you put your used needles and syringes in a special sharps container. Do not put them in a trash can. If you do not have a sharps container, call your pharmacist or healthcare provider to get one. A special MedGuide will be given to you by the pharmacist with each prescription and refill. Be sure to read this information carefully each time. Talk to your pediatrician regarding the use of this medicine in children. While this drug may be prescribed for selected conditions, precautions do apply. Overdosage: If you think you have taken too much of this medicine contact a poison control center or emergency room at once. NOTE: This medicine is only for you. Do not share this medicine with others. What if I miss a dose? If you miss a dose, take it as soon as you can. If it is almost time for your next dose, take only that dose. Do not take double or extra doses. What may interact  with this medication? Interactions have not been studied. This list may not describe all possible interactions. Give your health care provider a list of all the medicines, herbs, non-prescription drugs, or dietary supplements you use. Also tell them if you smoke, drink alcohol, or use illegal drugs. Some items may interact with your medicine. What should I watch for while using this medication? Your condition will be monitored carefully while you are receiving this medicine. You may need blood work done while you are taking this medicine. This medicine may cause a decrease in vitamin B6. You should make  sure that you get enough vitamin B6 while you are taking this medicine. Discuss the foods you eat and the vitamins you take with your health care professional. What side effects may I notice from receiving this medication? Side effects that you should report to your doctor or health care professional as soon as possible: allergic reactions like skin rash, itching or hives, swelling of the face, lips, or tongue seizures signs and symptoms of a blood clot such as breathing problems; changes in vision; chest pain; severe, sudden headache; pain, swelling, warmth in the leg; trouble speaking; sudden numbness or weakness of the face, arm or leg signs and symptoms of a stroke like changes in vision; confusion; trouble speaking or understanding; severe headaches; sudden numbness or weakness of the face, arm or leg; trouble walking; dizziness; loss of balance or coordination Side effects that usually do not require medical attention (report to your doctor or health care professional if they continue or are bothersome): chills cough dizziness fever headaches joint pain muscle cramps muscle pain nausea, vomiting pain, redness, or irritation at site where injected This list may not describe all possible side effects. Call your doctor for medical advice about side effects. You may report side effects to FDA at 1-800-FDA-1088. Where should I keep my medication? Keep out of the reach of children. Store in a refrigerator between 2 and 8 degrees C (36 and 46 degrees F). Do not freeze or shake. Throw away any unused portion if using a single-dose vial. Multi-dose vials can be kept in the refrigerator for up to 21 days after the initial dose. Throw away unused medicine. NOTE: This sheet is a summary. It may not cover all possible information. If you have questions about this medicine, talk to your doctor, pharmacist, or health care provider.  2022 Elsevier/Gold Standard (2017-01-05 00:00:00)

## 2021-07-02 ENCOUNTER — Encounter: Payer: Self-pay | Admitting: Oncology

## 2021-07-02 NOTE — Telephone Encounter (Signed)
NA

## 2021-07-02 NOTE — Progress Notes (Signed)
[facility]  [address] [place],  [place]  [address] [phone]  Clinic Day:  07/08/2021  Referring physician: Nicoletta Dress, MD  This document serves as a record of services personally performed by Idonna Heeren Macarthur Critchley, MD. It was created on their behalf by [facility], a trained medical scribe. The creation of this record is based on the scribe's personal observations and the provider's statements to them.  HISTORY OF PRESENT ILLNESS:  The patient is an 88 y.o. female with anemia secondary to chronic renal insufficiency and myelodysplasia.  As her hemoglobin has consistently been below 10, she has been receiving weekly Retacrit and Zarxio shots to improve her peripheral counts.  She comes in today for repeat clinical assessment.  Since her last visit, the patient has been doing fine.  She does believe her energy level has improved since her shots were increased to once weekly.   PHYSICAL EXAM:  Blood pressure (!) 167/70, pulse 87, temperature 97.8 F (36.6 C), resp. rate 16, height 5\' 4"  (1.626 m), weight 141 lb 11.2 oz (64.3 kg), SpO2 97 %. Wt Readings from Last 3 Encounters:  07/08/21 141 lb 11.2 oz (64.3 kg)  07/01/21 144 lb 1.9 oz (65.4 kg)  06/24/21 145 lb 0.6 oz (65.8 kg)   Body mass index is 24.32 kg/m. Performance status (ECOG): 2 Physical Exam Constitutional:      Appearance: Normal appearance. She is not ill-appearing.  HENT:     Mouth/Throat:     Mouth: Mucous membranes are moist.     Pharynx: Oropharynx is clear. No oropharyngeal exudate or posterior oropharyngeal erythema.  Cardiovascular:     Rate and Rhythm: Normal rate and regular rhythm.     Heart sounds: No murmur heard.   No friction rub. No gallop.  Pulmonary:     Effort: Pulmonary effort is normal. No respiratory distress.     Breath sounds: Normal breath sounds. No wheezing, rhonchi or rales.  Abdominal:     General: Bowel sounds are normal. There is no  distension.     Palpations: Abdomen is soft. There is no mass.     Tenderness: There is no abdominal tenderness.  Musculoskeletal:        General: No swelling.     Right lower leg: No edema.     Left lower leg: No edema.  Lymphadenopathy:     Cervical: No cervical adenopathy.     Upper Body:     Right upper body: No supraclavicular or axillary adenopathy.     Left upper body: No supraclavicular or axillary adenopathy.     Lower Body: No right inguinal adenopathy. No left inguinal adenopathy.  Skin:    General: Skin is warm.     Coloration: Skin is not jaundiced.     Findings: No lesion or rash.  Neurological:     General: No focal deficit present.     Mental Status: She is alert and oriented to person, place, and time. Mental status is at baseline.  Psychiatric:        Mood and Affect: Mood normal.        Behavior: Behavior normal.        Thought Content: Thought content normal.    LABS:   CBC Latest Ref Rng & Units 07/08/2021 07/04/2021 06/20/2021  WBC - 4.7 20.0 19.2  Hemoglobin 12.0 - 16.0 9.3(A) 8.8(A) 8.3(A)  Hematocrit 36 - 46 31(A) 29(A) 28(A)  Platelets 150 - 399 260 250 204  CMP Latest Ref Rng & Units 06/06/2021 11/22/2020 10/17/2020  Glucose 65 - 99 mg/dL - - -  BUN 4 - 21 59(A) 41(A) 45(A)  Creatinine 0.5 - 1.1 2.4(A) 1.8(A) 1.9(A)  Sodium 137 - 147 137 134(A) 137  Potassium 3.4 - 5.3 4.4 4.5 4.3  Chloride 99 - 108 107 101 105  CO2 13 - 22 22 24(A) 27(A)  Calcium 8.7 - 10.7 8.2(A) 8.3(A) 8.2(A)  Total Protein g/dL - 6.1 -  Total Bilirubin 0.0 - 1.2 mg/dL - - -  Alkaline Phos 25 - 125 111 103 122  AST 13 - 35 25 27 28   ALT 7 - 35 17 14 15     Latest Reference Range & Units 07/04/21 11:23  Iron 28 - 170 ug/dL 16 (L)  UIBC ug/dL 358  TIBC 250 - 450 ug/dL 374  Saturation Ratios 10.4 - 31.8 % 4 (L)  Ferritin 11 - 307 ng/mL 34  (L): Data is abnormally low.    Assessment/Plan:  An 88 y.o. female with anemia secondary to both renal insufficiency and myelodysplasia.   I am pleased as her hemoglobin has risen from 7 to 9.3 after increasing her Zarzio Retacrit shots to once weekly.  However, when reviewing recent labs, iron parameters now suggest that she is iron deficient.  Based upon this, I will hold her Zarzio/Retacrit shots over these next few weeks.  She will be given IV iron to bolster her iron stores and hopefully improve her hemoglobin even further.  I will see her back in 8 weeks to see how well she responded to her upcoming IV iron therapy.  Ultimately, she may need to be placed back on Zarzio/Retacrit if there is no significant improvement in her hemoglobin, particularly if it comes back still less than 10.  The patient understands all the plans discussed today and is in agreement with them.     I, Rita Ohara, am acting as scribe for [name], MD    I have reviewed this report as typed by the medical scribe, and it is complete and accurate.  Lenisha Lacap Macarthur Critchley, MD

## 2021-07-04 ENCOUNTER — Other Ambulatory Visit: Payer: Self-pay

## 2021-07-04 ENCOUNTER — Encounter: Payer: Self-pay | Admitting: Hematology and Oncology

## 2021-07-04 ENCOUNTER — Inpatient Hospital Stay: Payer: Medicare PPO

## 2021-07-04 DIAGNOSIS — D638 Anemia in other chronic diseases classified elsewhere: Secondary | ICD-10-CM

## 2021-07-04 DIAGNOSIS — D631 Anemia in chronic kidney disease: Secondary | ICD-10-CM | POA: Diagnosis not present

## 2021-07-04 DIAGNOSIS — D649 Anemia, unspecified: Secondary | ICD-10-CM

## 2021-07-04 DIAGNOSIS — N184 Chronic kidney disease, stage 4 (severe): Secondary | ICD-10-CM | POA: Diagnosis not present

## 2021-07-04 DIAGNOSIS — D460 Refractory anemia without ring sideroblasts, so stated: Secondary | ICD-10-CM | POA: Diagnosis not present

## 2021-07-04 DIAGNOSIS — Z79899 Other long term (current) drug therapy: Secondary | ICD-10-CM | POA: Diagnosis not present

## 2021-07-04 LAB — FERRITIN: Ferritin: 34 ng/mL (ref 11–307)

## 2021-07-04 LAB — CBC AND DIFFERENTIAL
HCT: 29 — AB (ref 36–46)
Hemoglobin: 8.8 — AB (ref 12.0–16.0)
Neutrophils Absolute: 17.8
Platelets: 250 (ref 150–399)
WBC: 20

## 2021-07-04 LAB — IRON AND TIBC
Iron: 16 ug/dL — ABNORMAL LOW (ref 28–170)
Saturation Ratios: 4 % — ABNORMAL LOW (ref 10.4–31.8)
TIBC: 374 ug/dL (ref 250–450)
UIBC: 358 ug/dL

## 2021-07-04 LAB — CBC: RBC: 3.1 — AB (ref 3.87–5.11)

## 2021-07-08 ENCOUNTER — Inpatient Hospital Stay: Payer: Medicare PPO

## 2021-07-08 ENCOUNTER — Other Ambulatory Visit: Payer: Self-pay

## 2021-07-08 ENCOUNTER — Other Ambulatory Visit: Payer: Self-pay | Admitting: Oncology

## 2021-07-08 ENCOUNTER — Telehealth: Payer: Self-pay | Admitting: Oncology

## 2021-07-08 ENCOUNTER — Inpatient Hospital Stay (INDEPENDENT_AMBULATORY_CARE_PROVIDER_SITE_OTHER): Payer: Medicare PPO | Admitting: Oncology

## 2021-07-08 ENCOUNTER — Encounter: Payer: Self-pay | Admitting: Oncology

## 2021-07-08 VITALS — BP 167/70 | HR 87 | Temp 97.8°F | Resp 16 | Ht 64.0 in | Wt 141.7 lb

## 2021-07-08 DIAGNOSIS — D649 Anemia, unspecified: Secondary | ICD-10-CM | POA: Diagnosis not present

## 2021-07-08 DIAGNOSIS — N189 Chronic kidney disease, unspecified: Secondary | ICD-10-CM

## 2021-07-08 DIAGNOSIS — D469 Myelodysplastic syndrome, unspecified: Secondary | ICD-10-CM | POA: Diagnosis not present

## 2021-07-08 DIAGNOSIS — D631 Anemia in chronic kidney disease: Secondary | ICD-10-CM

## 2021-07-08 DIAGNOSIS — D508 Other iron deficiency anemias: Secondary | ICD-10-CM | POA: Diagnosis not present

## 2021-07-08 DIAGNOSIS — D638 Anemia in other chronic diseases classified elsewhere: Secondary | ICD-10-CM | POA: Diagnosis not present

## 2021-07-08 DIAGNOSIS — D509 Iron deficiency anemia, unspecified: Secondary | ICD-10-CM | POA: Insufficient documentation

## 2021-07-08 LAB — CBC AND DIFFERENTIAL
HCT: 31 — AB (ref 36–46)
Hemoglobin: 9.3 — AB (ref 12.0–16.0)
Neutrophils Absolute: 2.73
Platelets: 260 (ref 150–399)
WBC: 4.7

## 2021-07-08 LAB — CBC: RBC: 3.3 — AB (ref 3.87–5.11)

## 2021-07-08 MED ORDER — FILGRASTIM-SNDZ 480 MCG/0.8ML IJ SOSY: 480.0000 ug | PREFILLED_SYRINGE | Freq: Once | INTRAMUSCULAR | Status: AC

## 2021-07-08 MED ORDER — EPOETIN ALFA-EPBX 40000 UNIT/ML IJ SOLN: 40000.0000 [IU] | Freq: Once | INTRAMUSCULAR | Status: DC

## 2021-07-08 NOTE — Telephone Encounter (Signed)
Patient has been scheduled for follow-up visit per 07/08/21 los. Pt given an appt calendar with date and time.

## 2021-07-09 DIAGNOSIS — D638 Anemia in other chronic diseases classified elsewhere: Secondary | ICD-10-CM | POA: Diagnosis not present

## 2021-07-09 DIAGNOSIS — N184 Chronic kidney disease, stage 4 (severe): Secondary | ICD-10-CM | POA: Diagnosis not present

## 2021-07-09 DIAGNOSIS — E039 Hypothyroidism, unspecified: Secondary | ICD-10-CM | POA: Diagnosis not present

## 2021-07-09 DIAGNOSIS — I5040 Unspecified combined systolic (congestive) and diastolic (congestive) heart failure: Secondary | ICD-10-CM | POA: Diagnosis not present

## 2021-07-09 DIAGNOSIS — I110 Hypertensive heart disease with heart failure: Secondary | ICD-10-CM | POA: Diagnosis not present

## 2021-07-09 DIAGNOSIS — E1142 Type 2 diabetes mellitus with diabetic polyneuropathy: Secondary | ICD-10-CM | POA: Diagnosis not present

## 2021-07-09 DIAGNOSIS — I482 Chronic atrial fibrillation, unspecified: Secondary | ICD-10-CM | POA: Diagnosis not present

## 2021-07-09 DIAGNOSIS — E785 Hyperlipidemia, unspecified: Secondary | ICD-10-CM | POA: Diagnosis not present

## 2021-07-11 ENCOUNTER — Encounter: Payer: Self-pay | Admitting: Oncology

## 2021-07-15 ENCOUNTER — Ambulatory Visit: Payer: Medicare PPO

## 2021-07-15 MED FILL — Ferumoxytol Inj 510 MG/17ML (30 MG/ML) (Elemental Fe): INTRAVENOUS | Qty: 17 | Status: AC

## 2021-07-16 ENCOUNTER — Other Ambulatory Visit: Payer: Self-pay

## 2021-07-16 ENCOUNTER — Inpatient Hospital Stay: Payer: Medicare PPO | Attending: Oncology

## 2021-07-16 VITALS — BP 134/55 | HR 74 | Temp 97.6°F | Resp 20 | Ht 64.0 in | Wt 145.0 lb

## 2021-07-16 DIAGNOSIS — Z79899 Other long term (current) drug therapy: Secondary | ICD-10-CM | POA: Insufficient documentation

## 2021-07-16 DIAGNOSIS — D631 Anemia in chronic kidney disease: Secondary | ICD-10-CM | POA: Insufficient documentation

## 2021-07-16 DIAGNOSIS — N184 Chronic kidney disease, stage 4 (severe): Secondary | ICD-10-CM | POA: Diagnosis not present

## 2021-07-16 DIAGNOSIS — D460 Refractory anemia without ring sideroblasts, so stated: Secondary | ICD-10-CM | POA: Diagnosis not present

## 2021-07-16 MED ORDER — SODIUM CHLORIDE 0.9 % IV SOLN: Freq: Once | INTRAVENOUS | Status: AC

## 2021-07-16 MED ORDER — SODIUM CHLORIDE 0.9 % IV SOLN
510.0000 mg | Freq: Once | INTRAVENOUS | Status: AC
  Administered 2021-07-16: 510 mg via INTRAVENOUS
  Filled 2021-07-16: qty 510

## 2021-07-16 NOTE — Patient Instructions (Addendum)

## 2021-07-18 ENCOUNTER — Other Ambulatory Visit: Payer: Self-pay

## 2021-07-18 ENCOUNTER — Encounter: Payer: Self-pay | Admitting: Sports Medicine

## 2021-07-18 ENCOUNTER — Ambulatory Visit (INDEPENDENT_AMBULATORY_CARE_PROVIDER_SITE_OTHER): Payer: Medicare PPO | Admitting: Sports Medicine

## 2021-07-18 DIAGNOSIS — L97521 Non-pressure chronic ulcer of other part of left foot limited to breakdown of skin: Secondary | ICD-10-CM

## 2021-07-18 DIAGNOSIS — L97511 Non-pressure chronic ulcer of other part of right foot limited to breakdown of skin: Secondary | ICD-10-CM | POA: Diagnosis not present

## 2021-07-18 DIAGNOSIS — M79674 Pain in right toe(s): Secondary | ICD-10-CM

## 2021-07-18 DIAGNOSIS — M79675 Pain in left toe(s): Secondary | ICD-10-CM

## 2021-07-18 DIAGNOSIS — B351 Tinea unguium: Secondary | ICD-10-CM | POA: Diagnosis not present

## 2021-07-18 DIAGNOSIS — E1140 Type 2 diabetes mellitus with diabetic neuropathy, unspecified: Secondary | ICD-10-CM

## 2021-07-18 NOTE — Progress Notes (Signed)
Subjective: ?Maureen Stout is a 88 y.o. female patient with history of diabetes who returns to office today for diabetic nail and wound care chronic.  Patient reports that she has nursing coming to the house to help with changing the dressings and states that they are doing a very good job.  Patient denies any changes like increasing redness warmth swelling or drainage or any other acute symptoms at this time. ? ?Objective: ?General: Patient is awake, alert, and oriented x 3 and in no acute distress. ? ?Integument: Skin is warm, dry and supple bilateral. Nails are elongated thickened and dystrophic with subungual debris, consistent with onychomycosis, 1-5 on right 1,2,4,5 on left.  Callus with pinpoint bleeding noted to the left plantar forefoot submet 1 and upon debridement measures 0.8 x 1 cm with a granular base, no periwound erythema no edema no warmth no malodor and left second toe, partial thickness wound measures 0.1 x 0.3 x 0.2 with a granular base and no surrounding signs of infection, and submet 1 on right that measures 0.3x 0.2x0.2 cm, smaller han previous, all with a granular base, no significant redness, no significant warmth, no malodor.  No acute signs of infection.   ? ?Neurovascular status unchanged from prior ? 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 Asymptomatic bunion and hammertoe pedal deformities noted bilateral. Muscular strength 5/5 in all lower extremity muscular groups bilateral without pain on range of motion. No tenderness with calf compression bilateral. ? ?Assessment and plan ?Problem List Items Addressed This Visit   ?None ?Visit Diagnoses   ? ? Pain due to onychomycosis of toenails of both feet    -  Primary  ? Foot ulcer, left, limited to breakdown of skin ([place])      ? Foot ulcer, limited to breakdown of skin, right ([place])      ? Type 2 diabetes, controlled, with neuropathy ([place])      ? ?  ? ? ?-Examined patient. ?-Re-Discussed and educated patient on diabetic foot care,  especially with regards to the vascular, neurological and musculoskeletal systems.  ?-Mechanically debrided all nails x9 using sterile nail nipper without incident and smooth with rotary bur  ?-For these chronic ulcers, mechanically debrided plantar ulcer left second toe and left and right forefoot submet 1 using a sterile chisel blade without incident and applied Medihoney and padded gauze dressing and advised patient to refrain from walking barefoot like previous however patient continues to do so which is causing her wounds ?-Continue with local and home wound care with [facility] as previously ordered with updated wound care instructions as above ?-Return to office in 10-12 weeks for nail care and wound check or sooner if problems or issues arise ?-Patient advised to call the office if any problems or questions arise in the meantime. ? ?Maureen Stout, DPM ?

## 2021-07-22 ENCOUNTER — Ambulatory Visit: Payer: Medicare PPO

## 2021-07-22 MED FILL — Ferumoxytol Inj 510 MG/17ML (30 MG/ML) (Elemental Fe): INTRAVENOUS | Qty: 17 | Status: AC

## 2021-07-23 ENCOUNTER — Other Ambulatory Visit: Payer: Self-pay

## 2021-07-23 ENCOUNTER — Inpatient Hospital Stay: Payer: Medicare PPO

## 2021-07-23 VITALS — BP 153/70 | HR 64 | Temp 98.0°F | Resp 18 | Ht 64.0 in | Wt 144.2 lb

## 2021-07-23 DIAGNOSIS — D631 Anemia in chronic kidney disease: Secondary | ICD-10-CM | POA: Diagnosis not present

## 2021-07-23 DIAGNOSIS — N184 Chronic kidney disease, stage 4 (severe): Secondary | ICD-10-CM

## 2021-07-23 DIAGNOSIS — Z79899 Other long term (current) drug therapy: Secondary | ICD-10-CM | POA: Diagnosis not present

## 2021-07-23 DIAGNOSIS — D460 Refractory anemia without ring sideroblasts, so stated: Secondary | ICD-10-CM | POA: Diagnosis not present

## 2021-07-23 MED ORDER — SODIUM CHLORIDE 0.9 % IV SOLN
510.0000 mg | Freq: Once | INTRAVENOUS | Status: AC
  Administered 2021-07-23: 510 mg via INTRAVENOUS
  Filled 2021-07-23: qty 510

## 2021-07-23 MED ORDER — SODIUM CHLORIDE 0.9 % IV SOLN: Freq: Once | INTRAVENOUS | Status: AC

## 2021-07-23 NOTE — Patient Instructions (Signed)

## 2021-07-29 ENCOUNTER — Ambulatory Visit: Payer: Medicare PPO

## 2021-08-05 ENCOUNTER — Other Ambulatory Visit: Payer: Self-pay

## 2021-08-05 ENCOUNTER — Inpatient Hospital Stay: Payer: Medicare PPO

## 2021-08-05 ENCOUNTER — Ambulatory Visit: Payer: Medicare PPO

## 2021-08-05 DIAGNOSIS — D469 Myelodysplastic syndrome, unspecified: Secondary | ICD-10-CM | POA: Diagnosis not present

## 2021-08-05 LAB — CBC AND DIFFERENTIAL
HCT: 24 — AB (ref 36–46)
Hemoglobin: 7.3 — AB (ref 12.0–16.0)
Neutrophils Absolute: 4.75
Platelets: 184 10*3/uL (ref 150–400)
WBC: 6.5

## 2021-08-05 LAB — CBC: RBC: 2.51 — AB (ref 3.87–5.11)

## 2021-08-05 NOTE — Progress Notes (Signed)
LEWIS NOTIFIED OF PT HGB.  ?

## 2021-08-06 DIAGNOSIS — D469 Myelodysplastic syndrome, unspecified: Secondary | ICD-10-CM | POA: Diagnosis not present

## 2021-08-12 ENCOUNTER — Ambulatory Visit: Payer: Medicare PPO

## 2021-08-19 ENCOUNTER — Ambulatory Visit: Payer: Medicare PPO

## 2021-08-26 ENCOUNTER — Ambulatory Visit: Payer: Medicare PPO

## 2021-09-01 NOTE — Progress Notes (Signed)
Was admitted during the response along with 1 W. status. ?Maureen Stout  ?[address] ?[place],  [place]  [address] ?[phone] ? ?Clinic Day:  09/02/2021 ? ?Referring physician: Nicoletta Dress, MD ? ?HISTORY OF PRESENT ILLNESS:  ?The patient is an 88 y.o. female with anemia secondary to chronic renal insufficiency and myelodysplasia.  At her last visit, her iron parameters also suggesting a component of iron deficiency anemia to where she was given a course of IV iron.  Before this, as her hemoglobin had consistently been below 10, she was receiving weekly Retacrit and Zarxio shots to improve her peripheral counts.  She comes in today for repeat clinical assessment.  Since her last visit, the patient has been doing okay.  She denies having increased fatigue or any overt forms of blood loss.  On another note, her right leg has become extremely swollen.  She claims to have incurred recent trauma from bumping her leg against a cabinet.   ? ?PHYSICAL EXAM:  ?Blood pressure 135/64, pulse 70, temperature 97.6 ?F (36.4 ?C), resp. rate 14, height '5\' 4"'$  (1.626 m), weight 145 lb 3.2 oz (65.9 kg), SpO2 100 %. ?Wt Readings from Last 3 Encounters:  ?09/02/21 145 lb 8 oz (66 kg)  ?09/02/21 145 lb 3.2 oz (65.9 kg)  ?07/23/21 144 lb 4 oz (65.4 kg)  ? ?Body mass index is 24.92 kg/m?Marland Kitchen ?Performance status (ECOG): 2 ?Physical Exam ?Constitutional:   ?   Appearance: Normal appearance. She is not ill-appearing.  ?HENT:  ?   Mouth/Throat:  ?   Mouth: Mucous membranes are moist.  ?   Pharynx: Oropharynx is clear. No oropharyngeal exudate or posterior oropharyngeal erythema.  ?Cardiovascular:  ?   Rate and Rhythm: Normal rate and regular rhythm.  ?   Heart sounds: No murmur heard. ?  No friction rub. No gallop.  ?Pulmonary:  ?   Effort: Pulmonary effort is normal. No respiratory distress.  ?   Breath sounds: Normal breath sounds. No wheezing, rhonchi or rales.  ?Abdominal:  ?   General: Bowel sounds  are normal. There is no distension.  ?   Palpations: Abdomen is soft. There is no mass.  ?   Tenderness: There is no abdominal tenderness.  ?Musculoskeletal:     ?   General: No swelling.  ?   Right lower leg: Swelling (3+ edema, exacerbated by a tight-fitting sock that extends up to her calf) present. No edema.  ?   Left lower leg: No edema.  ?Lymphadenopathy:  ?   Cervical: No cervical adenopathy.  ?   Upper Body:  ?   Right upper body: No supraclavicular or axillary adenopathy.  ?   Left upper body: No supraclavicular or axillary adenopathy.  ?   Lower Body: No right inguinal adenopathy. No left inguinal adenopathy.  ?Skin: ?   General: Skin is warm.  ?   Coloration: Skin is not jaundiced.  ?   Findings: No lesion or rash.  ?Neurological:  ?   General: No focal deficit present.  ?   Mental Status: She is alert and oriented to person, place, and time. Mental status is at baseline.  ?Psychiatric:     ?   Mood and Affect: Mood normal.     ?   Behavior: Behavior normal.     ?   Thought Content: Thought content normal.  ? ? ?LABS:  ? ? ?  Latest Ref Rng & Units 09/02/2021  ? 12:00  AM 08/05/2021  ? 12:00 AM 07/08/2021  ? 12:00 AM  ?CBC  ?WBC  6.0      6.5      4.7    ?Hemoglobin 12.0 - 16.0 6.6      7.3      9.3    ?Hematocrit 36 - 46 '21      24      31    '$ ?Platelets 150 - 400 K/uL 239      184      260    ?  ? This result is from an external source.  ? ? ?  Latest Ref Rng & Units 06/06/2021  ? 12:00 AM 11/22/2020  ? 12:00 AM 10/17/2020  ? 12:00 AM  ?CMP  ?BUN 4 - 21 59      41      45    ?Creatinine 0.5 - 1.1 2.4      1.8      1.9    ?Sodium 137 - 147 137      134      137    ?Potassium 3.4 - 5.3 4.4      4.5      4.3    ?Chloride 99 - 108 107      101      105    ?CO2 13 - '22 22      24      27    '$ ?Calcium 8.7 - 10.7 8.2      8.3      8.2    ?Total Protein g/dL  6.1        ?Alkaline Phos 25 - 125 111      103      122    ?AST 13 - 35 '25      27      28    '$ ?ALT 7 - 35 '17      14      15    '$ ?  ? This result is from an external  source.  ? ?Assessment/Plan:  An 88 y.o. female with anemia secondary to both renal insufficiency and myelodysplasia.  Despite receiving IV iron recently, her hemoglobin has precipitously fallen, even after receiving 2 units of blood 3+ weeks ago.  The patient denies having any overt forms of blood loss.  Her anemia essentially highlights how problematic her myelodysplasia is.  I will arrange for her to receive another 2 units of packed red cells tomorrow.  She will also receive Zarzio and Retacrit shots every 3 weeks to hopefully bolster her hemoglobin.  Due to the swelling in her right leg, I will also arrange for her to undergo a Doppler ultrasound of this extremity later this week.  I will inform her of the results as soon as they become available.  Otherwise, I will see her back in 6 weeks to reassess her peripheral counts.  The patient understands all the plans discussed today and is in agreement with them.   ? ?Chianti Goh Macarthur Critchley, MD ? ? ? ?  ?

## 2021-09-02 ENCOUNTER — Other Ambulatory Visit: Payer: Self-pay | Admitting: Oncology

## 2021-09-02 ENCOUNTER — Inpatient Hospital Stay (INDEPENDENT_AMBULATORY_CARE_PROVIDER_SITE_OTHER): Payer: Medicare PPO | Admitting: Oncology

## 2021-09-02 ENCOUNTER — Inpatient Hospital Stay: Payer: Medicare PPO | Attending: Oncology

## 2021-09-02 ENCOUNTER — Inpatient Hospital Stay: Payer: Medicare PPO

## 2021-09-02 ENCOUNTER — Other Ambulatory Visit: Payer: Self-pay | Admitting: Hematology and Oncology

## 2021-09-02 VITALS — BP 135/64 | HR 70 | Temp 97.6°F | Resp 14 | Ht 64.0 in | Wt 145.2 lb

## 2021-09-02 VITALS — BP 140/59 | HR 64 | Resp 18 | Ht 64.0 in | Wt 145.5 lb

## 2021-09-02 DIAGNOSIS — N184 Chronic kidney disease, stage 4 (severe): Secondary | ICD-10-CM | POA: Diagnosis not present

## 2021-09-02 DIAGNOSIS — D460 Refractory anemia without ring sideroblasts, so stated: Secondary | ICD-10-CM | POA: Insufficient documentation

## 2021-09-02 DIAGNOSIS — D631 Anemia in chronic kidney disease: Secondary | ICD-10-CM | POA: Insufficient documentation

## 2021-09-02 DIAGNOSIS — N189 Chronic kidney disease, unspecified: Secondary | ICD-10-CM | POA: Diagnosis not present

## 2021-09-02 DIAGNOSIS — D469 Myelodysplastic syndrome, unspecified: Secondary | ICD-10-CM

## 2021-09-02 DIAGNOSIS — D649 Anemia, unspecified: Secondary | ICD-10-CM

## 2021-09-02 LAB — CBC AND DIFFERENTIAL
HCT: 21 — AB (ref 36–46)
Hemoglobin: 6.6 — AB (ref 12.0–16.0)
Neutrophils Absolute: 4.56
Platelets: 239 10*3/uL (ref 150–400)
WBC: 6

## 2021-09-02 LAB — PREPARE RBC (CROSSMATCH)

## 2021-09-02 LAB — CBC: RBC: 2.18 — AB (ref 3.87–5.11)

## 2021-09-02 MED ORDER — FILGRASTIM-SNDZ 480 MCG/0.8ML IJ SOSY
480.0000 ug | PREFILLED_SYRINGE | Freq: Once | INTRAMUSCULAR | Status: AC
  Administered 2021-09-02: 480 ug via SUBCUTANEOUS
  Filled 2021-09-02: qty 0.8

## 2021-09-02 MED ORDER — EPOETIN ALFA-EPBX 40000 UNIT/ML IJ SOLN
40000.0000 [IU] | Freq: Once | INTRAMUSCULAR | Status: AC
  Administered 2021-09-02: 40000 [IU] via SUBCUTANEOUS
  Filled 2021-09-02: qty 1

## 2021-09-02 NOTE — Addendum Note (Signed)
Addended by: Juanetta Beets on: 09/02/2021 05:10 PM ? ? Modules accepted: Orders ? ?

## 2021-09-02 NOTE — Patient Instructions (Addendum)
\[id number], G-CSF injection ?What is this medication? ?FILGRASTIM, G-CSF (fil GRA stim) is a granulocyte colony-stimulating factor that stimulates the growth of neutrophils, a type of white blood cell (WBC) important in the body's fight against infection. It is used to reduce the incidence of fever and infection in patients with certain types of cancer who are receiving chemotherapy that affects the bone marrow, to stimulate blood cell production for removal of WBCs from the body prior to a bone marrow transplantation, to reduce the incidence of fever and infection in patients who have severe chronic neutropenia, and to improve survival outcomes following high-dose radiation exposure that is toxic to the bone marrow. ?This medicine may be used for other purposes; ask your health care provider or pharmacist if you have questions. ?COMMON BRAND NAME(S): Neupogen, Nivestym, Releuko, Zarxio ?What should I tell my care team before I take this medication? ?They need to know if you have any of these conditions: ?kidney disease ?latex allergy ?ongoing radiation therapy ?sickle cell disease ?an unusual or allergic reaction to filgrastim, pegfilgrastim, other medicines, foods, dyes, or preservatives ?pregnant or trying to get pregnant ?breast-feeding ?How should I use this medication? ?This medicine is for injection under the skin or infusion into a vein. As an infusion into a vein, it is usually given by a health care professional in a hospital or clinic setting. If you get this medicine at home, you will be taught how to prepare and give this medicine. Refer to the Instructions for Use that come with your medication packaging. Use exactly as directed. Take your medicine at regular intervals. Do not take your medicine more often than directed. ?It is important that you put your used needles and syringes in a special sharps container. Do not put them in a trash can. If you do not have a sharps container, call  your pharmacist or healthcare provider to get one. ?Talk to your pediatrician regarding the use of this medicine in children. While this drug may be prescribed for children as young as 7 months for selected conditions, precautions do apply. ?Overdosage: If you think you have taken too much of this medicine contact a poison control center or emergency room at once. ?NOTE: This medicine is only for you. Do not share this medicine with others. ?What if I miss a dose? ?It is important not to miss your dose. Call your doctor or health care professional if you miss a dose. ?What may interact with this medication? ?This medicine may interact with the following medications: ?medicines that may cause a release of neutrophils, such as lithium ?This list may not describe all possible interactions. Give your health care provider a list of all the medicines, herbs, non-prescription drugs, or dietary supplements you use. Also tell them if you smoke, drink alcohol, or use illegal drugs. Some items may interact with your medicine. ?What should I watch for while using this medication? ?Your condition will be monitored carefully while you are receiving this medicine. ?You may need blood work done while you are taking this medicine. ?Talk to your health care provider about your risk of cancer. You may be more at risk for certain types of cancer if you take this medicine. ?What side effects may I notice from receiving this medication? ?Side effects that you should report to your doctor or health care professional as soon as possible: ?allergic reactions like skin rash, itching or hives, swelling of the face, lips, or tongue ?back pain ?dizziness or feeling faint ?fever ?pain, redness, or  irritation at site where injected ?pinpoint red spots on the skin ?shortness of breath or breathing problems ?signs and symptoms of kidney injury like trouble passing urine, change in the amount of urine, or red or dark-brown urine ?stomach or side  pain, or pain at the shoulder ?swelling ?tiredness ?unusual bleeding or bruising ?Side effects that usually do not require medical attention (report to your doctor or health care professional if they continue or are bothersome): ?bone pain ?cough ?diarrhea ?hair loss ?headache ?muscle pain ?This list may not describe all possible side effects. Call your doctor for medical advice about side effects. You may report side effects to FDA at 1-800-FDA-1088. ?Where should I keep my medication? ?Keep out of the reach of children. ?Store in a refrigerator between 2 and 8 degrees C (36 and 46 degrees F). Do not freeze. Keep in carton to protect from light. Throw away this medicine if vials or syringes are left out of the refrigerator for more than 24 hours. Throw away any unused medicine after the expiration date. ?NOTE: This sheet is a summary. It may not cover all possible information. If you have questions about this medicine, talk to your doctor, pharmacist, or health care provider. ?? 2023 Elsevier/Gold Standard (2021-04-04 00:00:00) ?Epoetin Alfa injection ?What is this medication? ?EPOETIN ALFA (e POE e tin AL fa) helps your body make more red blood cells. This medicine is used to treat anemia caused by chronic kidney disease, cancer chemotherapy, or HIV-therapy. It may also be used before surgery if you have anemia. ?This medicine may be used for other purposes; ask your health care provider or pharmacist if you have questions. ?COMMON BRAND NAME(S): Epogen, Procrit, Retacrit ?What should I tell my care team before I take this medication? ?They need to know if you have any of these conditions: ?cancer ?heart disease ?high blood pressure ?history of blood clots ?history of stroke ?low levels of folate, iron, or vitamin B12 in the blood ?seizures ?an unusual or allergic reaction to erythropoietin, albumin, benzyl alcohol, hamster proteins, other medicines, foods, dyes, or preservatives ?pregnant or trying to get  pregnant ?breast-feeding ?How should I use this medication? ?This medicine is for injection into a vein or under the skin. It is usually given by a health care professional in a hospital or clinic setting. ?If you get this medicine at home, you will be taught how to prepare and give this medicine. Use exactly as directed. Take your medicine at regular intervals. Do not take your medicine more often than directed. ?It is important that you put your used needles and syringes in a special sharps container. Do not put them in a trash can. If you do not have a sharps container, call your pharmacist or healthcare provider to get one. ?A special MedGuide will be given to you by the pharmacist with each prescription and refill. Be sure to read this information carefully each time. ?Talk to your pediatrician regarding the use of this medicine in children. While this drug may be prescribed for selected conditions, precautions do apply. ?Overdosage: If you think you have taken too much of this medicine contact a poison control center or emergency room at once. ?NOTE: This medicine is only for you. Do not share this medicine with others. ?What if I miss a dose? ?If you miss a dose, take it as soon as you can. If it is almost time for your next dose, take only that dose. Do not take double or extra doses. ?What may interact  with this medication? ?Interactions have not been studied. ?This list may not describe all possible interactions. Give your health care provider a list of all the medicines, herbs, non-prescription drugs, or dietary supplements you use. Also tell them if you smoke, drink alcohol, or use illegal drugs. Some items may interact with your medicine. ?What should I watch for while using this medication? ?Your condition will be monitored carefully while you are receiving this medicine. ?You may need blood work done while you are taking this medicine. ?This medicine may cause a decrease in vitamin B6. You should make  sure that you get enough vitamin B6 while you are taking this medicine. Discuss the foods you eat and the vitamins you take with your health care professional. ?What side effects may I notice from receiving this

## 2021-09-02 NOTE — Progress Notes (Signed)
MD aware of results  ?

## 2021-09-03 ENCOUNTER — Inpatient Hospital Stay: Payer: Medicare PPO

## 2021-09-03 DIAGNOSIS — D469 Myelodysplastic syndrome, unspecified: Secondary | ICD-10-CM

## 2021-09-03 DIAGNOSIS — D460 Refractory anemia without ring sideroblasts, so stated: Secondary | ICD-10-CM | POA: Diagnosis not present

## 2021-09-03 DIAGNOSIS — D631 Anemia in chronic kidney disease: Secondary | ICD-10-CM | POA: Diagnosis not present

## 2021-09-03 DIAGNOSIS — N184 Chronic kidney disease, stage 4 (severe): Secondary | ICD-10-CM | POA: Diagnosis not present

## 2021-09-03 MED ORDER — SODIUM CHLORIDE 0.9% FLUSH: 10.0000 mL | INTRAVENOUS | Status: DC | PRN

## 2021-09-03 MED ORDER — SODIUM CHLORIDE 0.9% IV SOLUTION: 250.0000 mL | Freq: Once | INTRAVENOUS | Status: DC

## 2021-09-03 MED ORDER — DIPHENHYDRAMINE HCL 25 MG PO CAPS
25.0000 mg | ORAL_CAPSULE | Freq: Once | ORAL | Status: AC
  Administered 2021-09-03: 25 mg via ORAL
  Filled 2021-09-03: qty 1

## 2021-09-03 MED ORDER — ACETAMINOPHEN 325 MG PO TABS
650.0000 mg | ORAL_TABLET | Freq: Once | ORAL | Status: AC
  Administered 2021-09-03: 650 mg via ORAL
  Filled 2021-09-03: qty 2

## 2021-09-03 NOTE — Patient Instructions (Signed)
Blood Transfusion, Adult, Care After ?This sheet gives you information about how to care for yourself after your procedure. Your health care provider may also give you more specific instructions. If you have problems or questions, contact your health care provider. ?What can I expect after the procedure? ?After the procedure, it is common to have: ?Bruising and soreness where the IV was inserted. ?A headache. ?Follow these instructions at home: ?IV insertion site care ? ?  ? ?Follow instructions from your health care provider about how to take care of your IV insertion site. Make sure you: ?Wash your hands with soap and water before and after you change your bandage (dressing). If soap and water are not available, use hand sanitizer. ?Change your dressing as told by your health care provider. ?Check your IV insertion site every day for signs of infection. Check for: ?Redness, swelling, or pain. ?Bleeding from the site. ?Warmth. ?Pus or a bad smell. ?General instructions ?Take over-the-counter and prescription medicines only as told by your health care provider. ?Rest as told by your health care provider. ?Return to your normal activities as told by your health care provider. ?Keep all follow-up visits as told by your health care provider. This is important. ?Contact a health care provider if: ?You have itching or red, swollen areas of skin (hives). ?You feel anxious. ?You feel weak after doing your normal activities. ?You have redness, swelling, warmth, or pain around the IV insertion site. ?You have blood coming from the IV insertion site that does not stop with pressure. ?You have pus or a bad smell coming from your IV insertion site. ?Get help right away if: ?You have symptoms of a serious allergic or immune system reaction, including: ?Trouble breathing or shortness of breath. ?Swelling of the face or feeling flushed. ?Fever or chills. ?Pain in the head, back, or chest. ?Dark urine or blood in the  urine. ?Widespread rash. ?Fast heartbeat. ?Feeling dizzy or light-headed. ?If you receive your blood transfusion in an outpatient setting, you will be told whom to contact to report any reactions. ?These symptoms may represent a serious problem that is an emergency. Do not wait to see if the symptoms will go away. Get medical help right away. Call your local emergency services (911 in the [place] Do not drive yourself to the hospital. ?Summary ?Bruising and tenderness around the IV insertion site are common. ?Check your IV insertion site every day for signs of infection. ?Rest as told by your health care provider. Return to your normal activities as told by your health care provider. ?Get help right away for symptoms of a serious allergic or immune system reaction to blood transfusion. ?This information is not intended to replace advice given to you by your health care provider. Make sure you discuss any questions you have with your health care provider. ?Document Revised: 08/29/2020 Document Reviewed: 10/27/2018 ?Elsevier Patient Education ? 2023 Elsevier Inc. ? ?

## 2021-09-04 ENCOUNTER — Encounter: Payer: Self-pay | Admitting: Oncology

## 2021-09-04 DIAGNOSIS — M7989 Other specified soft tissue disorders: Secondary | ICD-10-CM | POA: Diagnosis not present

## 2021-09-04 DIAGNOSIS — M79604 Pain in right leg: Secondary | ICD-10-CM | POA: Diagnosis not present

## 2021-09-04 DIAGNOSIS — N184 Chronic kidney disease, stage 4 (severe): Secondary | ICD-10-CM | POA: Diagnosis not present

## 2021-09-04 DIAGNOSIS — D460 Refractory anemia without ring sideroblasts, so stated: Secondary | ICD-10-CM | POA: Diagnosis not present

## 2021-09-04 DIAGNOSIS — D631 Anemia in chronic kidney disease: Secondary | ICD-10-CM | POA: Diagnosis not present

## 2021-09-04 DIAGNOSIS — R600 Localized edema: Secondary | ICD-10-CM | POA: Diagnosis not present

## 2021-09-04 LAB — BPAM RBC
Blood Product Expiration Date: 202305222359
Blood Product Expiration Date: 202305222359
ISSUE DATE / TIME: 202304190851
ISSUE DATE / TIME: 202304190851
Unit Type and Rh: 5100
Unit Type and Rh: 5100

## 2021-09-04 LAB — TYPE AND SCREEN
ABO/RH(D): O POS
Antibody Screen: NEGATIVE
Unit division: 0
Unit division: 0

## 2021-09-04 LAB — IRON AND TIBC
Iron: 41 ug/dL (ref 28–170)
Saturation Ratios: 17 % (ref 10.4–31.8)
TIBC: 240 ug/dL — ABNORMAL LOW (ref 250–450)
UIBC: 199 ug/dL

## 2021-09-04 LAB — FERRITIN: Ferritin: 89 ng/mL (ref 11–307)

## 2021-09-08 ENCOUNTER — Telehealth: Payer: Self-pay

## 2021-09-08 NOTE — Telephone Encounter (Addendum)
09/09/2021 - I spoke with Vicente Serene, pt;s son and notified him of the results below. He verbalized understanding. ? ?09/08/21 - I attempted call to pt to notify her of venous doppler results.  Findings are  1. Directed duplex of the right lower extremity negative for DVT.  2. Anechoic fluid in the lateral right calf, potentially hematoma or seroma, less likely infection.  3. Edema.  ?

## 2021-09-09 ENCOUNTER — Encounter: Payer: Self-pay | Admitting: Oncology

## 2021-09-09 ENCOUNTER — Ambulatory Visit: Payer: Medicare PPO

## 2021-09-10 ENCOUNTER — Encounter: Payer: Self-pay | Admitting: Oncology

## 2021-09-16 ENCOUNTER — Other Ambulatory Visit: Payer: Medicare PPO

## 2021-09-16 ENCOUNTER — Ambulatory Visit: Payer: Medicare PPO

## 2021-09-23 ENCOUNTER — Inpatient Hospital Stay: Payer: Medicare PPO | Attending: Oncology

## 2021-09-23 ENCOUNTER — Other Ambulatory Visit: Payer: Self-pay

## 2021-09-23 ENCOUNTER — Inpatient Hospital Stay: Payer: Medicare PPO

## 2021-09-23 VITALS — BP 139/65 | HR 67 | Temp 98.1°F | Resp 18 | Ht 64.0 in | Wt 144.5 lb

## 2021-09-23 DIAGNOSIS — Z79899 Other long term (current) drug therapy: Secondary | ICD-10-CM | POA: Insufficient documentation

## 2021-09-23 DIAGNOSIS — D469 Myelodysplastic syndrome, unspecified: Secondary | ICD-10-CM | POA: Diagnosis not present

## 2021-09-23 DIAGNOSIS — N184 Chronic kidney disease, stage 4 (severe): Secondary | ICD-10-CM | POA: Insufficient documentation

## 2021-09-23 DIAGNOSIS — D631 Anemia in chronic kidney disease: Secondary | ICD-10-CM | POA: Insufficient documentation

## 2021-09-23 DIAGNOSIS — D460 Refractory anemia without ring sideroblasts, so stated: Secondary | ICD-10-CM | POA: Insufficient documentation

## 2021-09-23 DIAGNOSIS — D649 Anemia, unspecified: Secondary | ICD-10-CM

## 2021-09-23 DIAGNOSIS — R5383 Other fatigue: Secondary | ICD-10-CM | POA: Diagnosis not present

## 2021-09-23 LAB — CBC AND DIFFERENTIAL
HCT: 32 — AB (ref 36–46)
Hemoglobin: 9.9 — AB (ref 12.0–16.0)
Neutrophils Absolute: 2.58
Platelets: 243 10*3/uL (ref 150–400)
WBC: 4.6

## 2021-09-23 LAB — CBC: RBC: 3.28 — AB (ref 3.87–5.11)

## 2021-09-23 MED ORDER — EPOETIN ALFA-EPBX 40000 UNIT/ML IJ SOLN
40000.0000 [IU] | Freq: Once | INTRAMUSCULAR | Status: AC
  Administered 2021-09-23: 40000 [IU] via SUBCUTANEOUS
  Filled 2021-09-23: qty 1

## 2021-09-23 MED ORDER — FILGRASTIM-SNDZ 480 MCG/0.8ML IJ SOSY
480.0000 ug | PREFILLED_SYRINGE | Freq: Once | INTRAMUSCULAR | Status: AC
  Administered 2021-09-23: 480 ug via SUBCUTANEOUS
  Filled 2021-09-23: qty 0.8

## 2021-09-23 NOTE — Patient Instructions (Signed)
Epoetin Alfa injection ?What is this medication? ?EPOETIN ALFA (e POE e tin AL fa) helps your body make more red blood cells. This medicine is used to treat anemia caused by chronic kidney disease, cancer chemotherapy, or HIV-therapy. It may also be used before surgery if you have anemia. ?This medicine may be used for other purposes; ask your health care provider or pharmacist if you have questions. ?COMMON BRAND NAME(S): Epogen, Procrit, Retacrit ?What should I tell my care team before I take this medication? ?They need to know if you have any of these conditions: ?cancer ?heart disease ?high blood pressure ?history of blood clots ?history of stroke ?low levels of folate, iron, or vitamin B12 in the blood ?seizures ?an unusual or allergic reaction to erythropoietin, albumin, benzyl alcohol, hamster proteins, other medicines, foods, dyes, or preservatives ?pregnant or trying to get pregnant ?breast-feeding ?How should I use this medication? ?This medicine is for injection into a vein or under the skin. It is usually given by a health care professional in a hospital or clinic setting. ?If you get this medicine at home, you will be taught how to prepare and give this medicine. Use exactly as directed. Take your medicine at regular intervals. Do not take your medicine more often than directed. ?It is important that you put your used needles and syringes in a special sharps container. Do not put them in a trash can. If you do not have a sharps container, call your pharmacist or healthcare provider to get one. ?A special MedGuide will be given to you by the pharmacist with each prescription and refill. Be sure to read this information carefully each time. ?Talk to your pediatrician regarding the use of this medicine in children. While this drug may be prescribed for selected conditions, precautions do apply. ?Overdosage: If you think you have taken too much of this medicine contact a poison control center or emergency  room at once. ?NOTE: This medicine is only for you. Do not share this medicine with others. ?What if I miss a dose? ?If you miss a dose, take it as soon as you can. If it is almost time for your next dose, take only that dose. Do not take double or extra doses. ?What may interact with this medication? ?Interactions have not been studied. ?This list may not describe all possible interactions. Give your health care provider a list of all the medicines, herbs, non-prescription drugs, or dietary supplements you use. Also tell them if you smoke, drink alcohol, or use illegal drugs. Some items may interact with your medicine. ?What should I watch for while using this medication? ?Your condition will be monitored carefully while you are receiving this medicine. ?You may need blood work done while you are taking this medicine. ?This medicine may cause a decrease in vitamin B6. You should make sure that you get enough vitamin B6 while you are taking this medicine. Discuss the foods you eat and the vitamins you take with your health care professional. ?What side effects may I notice from receiving this medication? ?Side effects that you should report to your doctor or health care professional as soon as possible: ?allergic reactions like skin rash, itching or hives, swelling of the face, lips, or tongue ?seizures ?signs and symptoms of a blood clot such as breathing problems; changes in vision; chest pain; severe, sudden headache; pain, swelling, warmth in the leg; trouble speaking; sudden numbness or weakness of the face, arm or leg ?signs and symptoms of a stroke like   changes in vision; confusion; trouble speaking or understanding; severe headaches; sudden numbness or weakness of the face, arm or leg; trouble walking; dizziness; loss of balance or coordination ?Side effects that usually do not require medical attention (report to your doctor or health care professional if they continue or are  bothersome): ?chills ?cough ?dizziness ?fever ?headaches ?joint pain ?muscle cramps ?muscle pain ?nausea, vomiting ?pain, redness, or irritation at site where injected ?This list may not describe all possible side effects. Call your doctor for medical advice about side effects. You may report side effects to FDA at 1-800-FDA-1088. ?Where should I keep my medication? ?Keep out of the reach of children. ?Store in a refrigerator between 2 and 8 degrees C (36 and 46 degrees F). Do not freeze or shake. Throw away any unused portion if using a single-dose vial. Multi-dose vials can be kept in the refrigerator for up to 21 days after the initial dose. Throw away unused medicine. ?NOTE: This sheet is a summary. It may not cover all possible information. If you have questions about this medicine, talk to your doctor, pharmacist, or health care provider. ?? 2023 Elsevier/Gold Standard (2017-01-05 00:00:00) ?Filgrastim, G-CSF injection ?What is this medication? ?FILGRASTIM, G-CSF (fil GRA stim) is a granulocyte colony-stimulating factor that stimulates the growth of neutrophils, a type of white blood cell (WBC) important in the body's fight against infection. It is used to reduce the incidence of fever and infection in patients with certain types of cancer who are receiving chemotherapy that affects the bone marrow, to stimulate blood cell production for removal of WBCs from the body prior to a bone marrow transplantation, to reduce the incidence of fever and infection in patients who have severe chronic neutropenia, and to improve survival outcomes following high-dose radiation exposure that is toxic to the bone marrow. ?This medicine may be used for other purposes; ask your health care provider or pharmacist if you have questions. ?COMMON BRAND NAME(S): Neupogen, Nivestym, Releuko, Zarxio ?What should I tell my care team before I take this medication? ?They need to know if you have any of these conditions: ?kidney  disease ?latex allergy ?ongoing radiation therapy ?sickle cell disease ?an unusual or allergic reaction to filgrastim, pegfilgrastim, other medicines, foods, dyes, or preservatives ?pregnant or trying to get pregnant ?breast-feeding ?How should I use this medication? ?This medicine is for injection under the skin or infusion into a vein. As an infusion into a vein, it is usually given by a health care professional in a hospital or clinic setting. If you get this medicine at home, you will be taught how to prepare and give this medicine. Refer to the Instructions for Use that come with your medication packaging. Use exactly as directed. Take your medicine at regular intervals. Do not take your medicine more often than directed. ?It is important that you put your used needles and syringes in a special sharps container. Do not put them in a trash can. If you do not have a sharps container, call your pharmacist or healthcare provider to get one. ?Talk to your pediatrician regarding the use of this medicine in children. While this drug may be prescribed for children as young as 7 months for selected conditions, precautions do apply. ?Overdosage: If you think you have taken too much of this medicine contact a poison control center or emergency room at once. ?NOTE: This medicine is only for you. Do not share this medicine with others. ?What if I miss a dose? ?It is important not  to miss your dose. Call your doctor or health care professional if you miss a dose. ?What may interact with this medication? ?This medicine may interact with the following medications: ?medicines that may cause a release of neutrophils, such as lithium ?This list may not describe all possible interactions. Give your health care provider a list of all the medicines, herbs, non-prescription drugs, or dietary supplements you use. Also tell them if you smoke, drink alcohol, or use illegal drugs. Some items may interact with your medicine. ?What should  I watch for while using this medication? ?Your condition will be monitored carefully while you are receiving this medicine. ?You may need blood work done while you are taking this medicine. ?Talk to your health care provider about your risk o

## 2021-09-30 ENCOUNTER — Other Ambulatory Visit: Payer: Medicare PPO

## 2021-09-30 ENCOUNTER — Ambulatory Visit: Payer: Medicare PPO

## 2021-10-07 ENCOUNTER — Ambulatory Visit: Payer: Medicare PPO

## 2021-10-07 ENCOUNTER — Other Ambulatory Visit: Payer: Medicare PPO

## 2021-10-08 DIAGNOSIS — N184 Chronic kidney disease, stage 4 (severe): Secondary | ICD-10-CM | POA: Diagnosis not present

## 2021-10-08 DIAGNOSIS — E039 Hypothyroidism, unspecified: Secondary | ICD-10-CM | POA: Diagnosis not present

## 2021-10-08 DIAGNOSIS — E785 Hyperlipidemia, unspecified: Secondary | ICD-10-CM | POA: Diagnosis not present

## 2021-10-08 DIAGNOSIS — D638 Anemia in other chronic diseases classified elsewhere: Secondary | ICD-10-CM | POA: Diagnosis not present

## 2021-10-08 DIAGNOSIS — I482 Chronic atrial fibrillation, unspecified: Secondary | ICD-10-CM | POA: Diagnosis not present

## 2021-10-08 DIAGNOSIS — I110 Hypertensive heart disease with heart failure: Secondary | ICD-10-CM | POA: Diagnosis not present

## 2021-10-08 DIAGNOSIS — E1142 Type 2 diabetes mellitus with diabetic polyneuropathy: Secondary | ICD-10-CM | POA: Diagnosis not present

## 2021-10-08 DIAGNOSIS — I5040 Unspecified combined systolic (congestive) and diastolic (congestive) heart failure: Secondary | ICD-10-CM | POA: Diagnosis not present

## 2021-10-13 NOTE — Progress Notes (Unsigned)
[facility]  [address] [place],  [place]  [id number] [phone]  Clinic Day:  09/02/2021  Referring physician: Nicoletta Dress, MD  HISTORY OF PRESENT ILLNESS:  The patient is an 88 y.o. female with anemia secondary to chronic renal insufficiency and myelodysplasia.  Iron parameters earlier this year also suggested a component of iron deficiency anemia to where she was given a course of IV iron.  Despite receiving IV iron and periodic red and white cell shots, her hemoglobin has remained suboptimal.  She comes in today to reassess her hemoglobin.  Since her last visit, the patient has been doing okay.  She does complain of persistent fatigue, but continues to deny having any overt forms of blood loss.  PHYSICAL EXAM:  Blood pressure (!) 142/73, pulse 72, temperature 97.7 F (36.5 C), resp. rate 14, height '5\' 4"'$  (1.626 m), weight 141 lb 3.2 oz (64 kg), SpO2 100 %. Wt Readings from Last 3 Encounters:  10/14/21 137 lb 1.9 oz (62.2 kg)  10/14/21 141 lb 3.2 oz (64 kg)  09/23/21 144 lb 8 oz (65.5 kg)   Body mass index is 24.24 kg/m. Performance status (ECOG): 2 Physical Exam Constitutional:      Appearance: Normal appearance. She is not ill-appearing.  HENT:     Mouth/Throat:     Mouth: Mucous membranes are moist.     Pharynx: Oropharynx is clear. No oropharyngeal exudate or posterior oropharyngeal erythema.  Cardiovascular:     Rate and Rhythm: Normal rate and regular rhythm.     Heart sounds: No murmur heard.   No friction rub. No gallop.  Pulmonary:     Effort: Pulmonary effort is normal. No respiratory distress.     Breath sounds: Normal breath sounds. No wheezing, rhonchi or rales.  Abdominal:     General: Bowel sounds are normal. There is no distension.     Palpations: Abdomen is soft. There is no mass.     Tenderness: There is no abdominal tenderness.  Musculoskeletal:        General: No swelling.     Right lower leg: No swelling.  No edema.     Left lower leg: No edema.  Lymphadenopathy:     Cervical: No cervical adenopathy.     Upper Body:     Right upper body: No supraclavicular or axillary adenopathy.     Left upper body: No supraclavicular or axillary adenopathy.     Lower Body: No right inguinal adenopathy. No left inguinal adenopathy.  Skin:    General: Skin is warm.     Coloration: Skin is not jaundiced.     Findings: No lesion or rash.  Neurological:     General: No focal deficit present.     Mental Status: She is alert and oriented to person, place, and time. Mental status is at baseline.  Psychiatric:        Mood and Affect: Mood normal.        Behavior: Behavior normal.        Thought Content: Thought content normal.    LABS:      Latest Ref Rng & Units 10/14/2021   12:00 AM 09/23/2021   12:00 AM 09/02/2021   12:00 AM  CBC  WBC  4.7      4.6      6.0       Hemoglobin 12.0 - 16.0 7.3      9.9      6.6  Hematocrit 36 - 46 23      32      21       Platelets 150 - 400 K/uL 325      243      239          This result is from an external source.      Latest Ref Rng & Units 06/06/2021   12:00 AM 11/22/2020   12:00 AM 10/17/2020   12:00 AM  CMP  BUN 4 - 21 59      41      45    Creatinine 0.5 - 1.1 2.4      1.8      1.9    Sodium 137 - 147 137      134      137    Potassium 3.4 - 5.3 4.4      4.5      4.3    Chloride 99 - 108 107      101      105    CO2 13 - '22 22      24      27    '$ Calcium 8.7 - 10.7 8.2      8.3      8.2    Total Protein g/dL  6.1        Alkaline Phos 25 - 125 111      103      122    AST 13 - 35 '25      27      28    '$ ALT 7 - 35 '17      14      15       '$ This result is from an external source.    Latest Reference Range & Units 10/14/21 15:34  Iron 28 - 170 ug/dL 32  UIBC ug/dL 169  TIBC 250 - 450 ug/dL 201 (L)  Saturation Ratios 10.4 - 31.8 % 16  Ferritin 11 - 307 ng/mL 106  (L): Data is abnormally low   Assessment/Plan:  An 88 y.o. female with anemia secondary  to both renal insufficiency, iron deficiency and myelodysplasia.  Once again, this patient's hemoglobin came back very low today.  Over these past weeks, she received both Retacrit and Zarxio every 3 weeks.  Due to her low hemoglobin today, these shots will be increased back to weekly doses over the next month.  I did give the patient the option of considering a blood transfusion.  However, she declined wanting to undergo one, particularly as she did not feel better after receiving previous blood transfusions. I will see he r back in 4 weeks to reassess her peripheral counts.  The patient, knows to contact our office before her next visit if she gets weaker to where a blood transfusion needs to be reconsidered.   Decklyn Hornik Macarthur Critchley, MD

## 2021-10-14 ENCOUNTER — Inpatient Hospital Stay: Payer: Medicare PPO

## 2021-10-14 ENCOUNTER — Other Ambulatory Visit: Payer: Self-pay | Admitting: Oncology

## 2021-10-14 ENCOUNTER — Inpatient Hospital Stay: Payer: Medicare PPO | Admitting: Oncology

## 2021-10-14 VITALS — BP 142/73 | HR 72 | Temp 97.7°F | Resp 14 | Ht 64.0 in | Wt 141.2 lb

## 2021-10-14 VITALS — BP 149/59 | HR 72 | Temp 98.0°F | Resp 18 | Ht 64.0 in | Wt 137.1 lb

## 2021-10-14 DIAGNOSIS — D460 Refractory anemia without ring sideroblasts, so stated: Secondary | ICD-10-CM | POA: Diagnosis not present

## 2021-10-14 DIAGNOSIS — R5383 Other fatigue: Secondary | ICD-10-CM | POA: Diagnosis not present

## 2021-10-14 DIAGNOSIS — D631 Anemia in chronic kidney disease: Secondary | ICD-10-CM | POA: Diagnosis not present

## 2021-10-14 DIAGNOSIS — D469 Myelodysplastic syndrome, unspecified: Secondary | ICD-10-CM

## 2021-10-14 DIAGNOSIS — Z79899 Other long term (current) drug therapy: Secondary | ICD-10-CM | POA: Diagnosis not present

## 2021-10-14 DIAGNOSIS — N184 Chronic kidney disease, stage 4 (severe): Secondary | ICD-10-CM | POA: Diagnosis not present

## 2021-10-14 LAB — CBC AND DIFFERENTIAL
HCT: 23 — AB (ref 36–46)
Hemoglobin: 7.3 — AB (ref 12.0–16.0)
Neutrophils Absolute: 3.06
Platelets: 325 K/uL (ref 150–400)
WBC: 4.7

## 2021-10-14 LAB — IRON AND TIBC
Iron: 32 ug/dL (ref 28–170)
Saturation Ratios: 16 % (ref 10.4–31.8)
TIBC: 201 ug/dL — ABNORMAL LOW (ref 250–450)
UIBC: 169 ug/dL

## 2021-10-14 LAB — FERRITIN: Ferritin: 106 ng/mL (ref 11–307)

## 2021-10-14 LAB — CBC: RBC: 2.44 — AB (ref 3.87–5.11)

## 2021-10-14 MED ORDER — EPOETIN ALFA-EPBX 40000 UNIT/ML IJ SOLN
40000.0000 [IU] | Freq: Once | INTRAMUSCULAR | Status: AC
  Administered 2021-10-14: 40000 [IU] via SUBCUTANEOUS
  Filled 2021-10-14: qty 1

## 2021-10-14 MED ORDER — FILGRASTIM-SNDZ 480 MCG/0.8ML IJ SOSY
480.0000 ug | PREFILLED_SYRINGE | Freq: Once | INTRAMUSCULAR | Status: AC
  Administered 2021-10-14: 480 ug via SUBCUTANEOUS
  Filled 2021-10-14: qty 0.8

## 2021-10-14 NOTE — Patient Instructions (Signed)
Epoetin Alfa injection What is this medication? EPOETIN ALFA (e POE e tin AL fa) helps your body make more red blood cells. This medicine is used to treat anemia caused by chronic kidney disease, cancer chemotherapy, or HIV-therapy. It may also be used before surgery if you have anemia. This medicine may be used for other purposes; ask your health care provider or pharmacist if you have questions. COMMON BRAND NAME(S): Epogen, Procrit, Retacrit What should I tell my care team before I take this medication? They need to know if you have any of these conditions: cancer heart disease high blood pressure history of blood clots history of stroke low levels of folate, iron, or vitamin B12 in the blood seizures an unusual or allergic reaction to erythropoietin, albumin, benzyl alcohol, hamster proteins, other medicines, foods, dyes, or preservatives pregnant or trying to get pregnant breast-feeding How should I use this medication? This medicine is for injection into a vein or under the skin. It is usually given by a health care professional in a hospital or clinic setting. If you get this medicine at home, you will be taught how to prepare and give this medicine. Use exactly as directed. Take your medicine at regular intervals. Do not take your medicine more often than directed. It is important that you put your used needles and syringes in a special sharps container. Do not put them in a trash can. If you do not have a sharps container, call your pharmacist or healthcare provider to get one. A special MedGuide will be given to you by the pharmacist with each prescription and refill. Be sure to read this information carefully each time. Talk to your pediatrician regarding the use of this medicine in children. While this drug may be prescribed for selected conditions, precautions do apply. Overdosage: If you think you have taken too much of this medicine contact a poison control center or emergency  room at once. NOTE: This medicine is only for you. Do not share this medicine with others. What if I miss a dose? If you miss a dose, take it as soon as you can. If it is almost time for your next dose, take only that dose. Do not take double or extra doses. What may interact with this medication? Interactions have not been studied. This list may not describe all possible interactions. Give your health care provider a list of all the medicines, herbs, non-prescription drugs, or dietary supplements you use. Also tell them if you smoke, drink alcohol, or use illegal drugs. Some items may interact with your medicine. What should I watch for while using this medication? Your condition will be monitored carefully while you are receiving this medicine. You may need blood work done while you are taking this medicine. This medicine may cause a decrease in vitamin B6. You should make sure that you get enough vitamin B6 while you are taking this medicine. Discuss the foods you eat and the vitamins you take with your health care professional. What side effects may I notice from receiving this medication? Side effects that you should report to your doctor or health care professional as soon as possible: allergic reactions like skin rash, itching or hives, swelling of the face, lips, or tongue seizures signs and symptoms of a blood clot such as breathing problems; changes in vision; chest pain; severe, sudden headache; pain, swelling, warmth in the leg; trouble speaking; sudden numbness or weakness of the face, arm or leg signs and symptoms of a stroke like   changes in vision; confusion; trouble speaking or understanding; severe headaches; sudden numbness or weakness of the face, arm or leg; trouble walking; dizziness; loss of balance or coordination Side effects that usually do not require medical attention (report to your doctor or health care professional if they continue or are  bothersome): chills cough dizziness fever headaches joint pain muscle cramps muscle pain nausea, vomiting pain, redness, or irritation at site where injected This list may not describe all possible side effects. Call your doctor for medical advice about side effects. You may report side effects to FDA at 1-800-FDA-1088. Where should I keep my medication? Keep out of the reach of children. Store in a refrigerator between 2 and 8 degrees C (36 and 46 degrees F). Do not freeze or shake. Throw away any unused portion if using a single-dose vial. Multi-dose vials can be kept in the refrigerator for up to 21 days after the initial dose. Throw away unused medicine. NOTE: This sheet is a summary. It may not cover all possible information. If you have questions about this medicine, talk to your doctor, pharmacist, or health care provider.  2023 Elsevier/Gold Standard (2017-01-05 00:00:00) Filgrastim, G-CSF injection What is this medication? FILGRASTIM, G-CSF (fil GRA stim) is a granulocyte colony-stimulating factor that stimulates the growth of neutrophils, a type of white blood cell (WBC) important in the body's fight against infection. It is used to reduce the incidence of fever and infection in patients with certain types of cancer who are receiving chemotherapy that affects the bone marrow, to stimulate blood cell production for removal of WBCs from the body prior to a bone marrow transplantation, to reduce the incidence of fever and infection in patients who have severe chronic neutropenia, and to improve survival outcomes following high-dose radiation exposure that is toxic to the bone marrow. This medicine may be used for other purposes; ask your health care provider or pharmacist if you have questions. COMMON BRAND NAME(S): Neupogen, Nivestym, Releuko, Zarxio What should I tell my care team before I take this medication? They need to know if you have any of these conditions: kidney  disease latex allergy ongoing radiation therapy sickle cell disease an unusual or allergic reaction to filgrastim, pegfilgrastim, other medicines, foods, dyes, or preservatives pregnant or trying to get pregnant breast-feeding How should I use this medication? This medicine is for injection under the skin or infusion into a vein. As an infusion into a vein, it is usually given by a health care professional in a hospital or clinic setting. If you get this medicine at home, you will be taught how to prepare and give this medicine. Refer to the Instructions for Use that come with your medication packaging. Use exactly as directed. Take your medicine at regular intervals. Do not take your medicine more often than directed. It is important that you put your used needles and syringes in a special sharps container. Do not put them in a trash can. If you do not have a sharps container, call your pharmacist or healthcare provider to get one. Talk to your pediatrician regarding the use of this medicine in children. While this drug may be prescribed for children as young as 7 months for selected conditions, precautions do apply. Overdosage: If you think you have taken too much of this medicine contact a poison control center or emergency room at once. NOTE: This medicine is only for you. Do not share this medicine with others. What if I miss a dose? It is important not   to miss your dose. Call your doctor or health care professional if you miss a dose. What may interact with this medication? This medicine may interact with the following medications: medicines that may cause a release of neutrophils, such as lithium This list may not describe all possible interactions. Give your health care provider a list of all the medicines, herbs, non-prescription drugs, or dietary supplements you use. Also tell them if you smoke, drink alcohol, or use illegal drugs. Some items may interact with your medicine. What should  I watch for while using this medication? Your condition will be monitored carefully while you are receiving this medicine. You may need blood work done while you are taking this medicine. Talk to your health care provider about your risk of cancer. You may be more at risk for certain types of cancer if you take this medicine. What side effects may I notice from receiving this medication? Side effects that you should report to your doctor or health care professional as soon as possible: allergic reactions like skin rash, itching or hives, swelling of the face, lips, or tongue back pain dizziness or feeling faint fever pain, redness, or irritation at site where injected pinpoint red spots on the skin shortness of breath or breathing problems signs and symptoms of kidney injury like trouble passing urine, change in the amount of urine, or red or dark-brown urine stomach or side pain, or pain at the shoulder swelling tiredness unusual bleeding or bruising Side effects that usually do not require medical attention (report to your doctor or health care professional if they continue or are bothersome): bone pain cough diarrhea hair loss headache muscle pain This list may not describe all possible side effects. Call your doctor for medical advice about side effects. You may report side effects to FDA at 1-800-FDA-1088. Where should I keep my medication? Keep out of the reach of children. Store in a refrigerator between 2 and 8 degrees C (36 and 46 degrees F). Do not freeze. Keep in carton to protect from light. Throw away this medicine if vials or syringes are left out of the refrigerator for more than 24 hours. Throw away any unused medicine after the expiration date. NOTE: This sheet is a summary. It may not cover all possible information. If you have questions about this medicine, talk to your doctor, pharmacist, or health care provider.  2023 Elsevier/Gold Standard (2021-04-04  00:00:00)  

## 2021-10-15 ENCOUNTER — Encounter: Payer: Self-pay | Admitting: Oncology

## 2021-10-20 ENCOUNTER — Inpatient Hospital Stay: Payer: Medicare PPO

## 2021-10-20 ENCOUNTER — Encounter: Payer: Self-pay | Admitting: Podiatry

## 2021-10-20 ENCOUNTER — Ambulatory Visit: Payer: Medicare PPO | Admitting: Podiatry

## 2021-10-20 ENCOUNTER — Inpatient Hospital Stay: Payer: Medicare PPO | Attending: Oncology

## 2021-10-20 ENCOUNTER — Ambulatory Visit (INDEPENDENT_AMBULATORY_CARE_PROVIDER_SITE_OTHER): Payer: Medicare PPO | Admitting: Podiatry

## 2021-10-20 VITALS — BP 141/60 | HR 69 | Temp 97.5°F | Resp 18 | Ht 64.0 in | Wt 140.5 lb

## 2021-10-20 DIAGNOSIS — N184 Chronic kidney disease, stage 4 (severe): Secondary | ICD-10-CM | POA: Insufficient documentation

## 2021-10-20 DIAGNOSIS — D469 Myelodysplastic syndrome, unspecified: Secondary | ICD-10-CM | POA: Diagnosis not present

## 2021-10-20 DIAGNOSIS — D460 Refractory anemia without ring sideroblasts, so stated: Secondary | ICD-10-CM | POA: Diagnosis not present

## 2021-10-20 DIAGNOSIS — D631 Anemia in chronic kidney disease: Secondary | ICD-10-CM | POA: Diagnosis not present

## 2021-10-20 DIAGNOSIS — M79675 Pain in left toe(s): Secondary | ICD-10-CM

## 2021-10-20 DIAGNOSIS — B351 Tinea unguium: Secondary | ICD-10-CM | POA: Diagnosis not present

## 2021-10-20 DIAGNOSIS — L97521 Non-pressure chronic ulcer of other part of left foot limited to breakdown of skin: Secondary | ICD-10-CM

## 2021-10-20 DIAGNOSIS — E1140 Type 2 diabetes mellitus with diabetic neuropathy, unspecified: Secondary | ICD-10-CM | POA: Diagnosis not present

## 2021-10-20 DIAGNOSIS — M79674 Pain in right toe(s): Secondary | ICD-10-CM

## 2021-10-20 DIAGNOSIS — L97511 Non-pressure chronic ulcer of other part of right foot limited to breakdown of skin: Secondary | ICD-10-CM

## 2021-10-20 DIAGNOSIS — Z79899 Other long term (current) drug therapy: Secondary | ICD-10-CM | POA: Insufficient documentation

## 2021-10-20 LAB — HEPATIC FUNCTION PANEL
ALT: 17 U/L (ref 7–35)
AST: 23 (ref 13–35)
Alkaline Phosphatase: 95 (ref 25–125)
Bilirubin, Total: 0.4

## 2021-10-20 LAB — CBC AND DIFFERENTIAL
HCT: 24 — AB (ref 36–46)
Hemoglobin: 7.5 — AB (ref 12.0–16.0)
Neutrophils Absolute: 6.44
Platelets: 311 K/uL (ref 150–400)
WBC: 8.7

## 2021-10-20 LAB — BASIC METABOLIC PANEL WITH GFR
BUN: 54 — AB (ref 4–21)
CO2: 25 — AB (ref 13–22)
Chloride: 106 (ref 99–108)
Creatinine: 2.1 — AB (ref 0.5–1.1)
Glucose: 98
Potassium: 4.6 meq/L (ref 3.5–5.1)
Sodium: 140 (ref 137–147)

## 2021-10-20 LAB — CBC: RBC: 2.53 — AB (ref 3.87–5.11)

## 2021-10-20 LAB — COMPREHENSIVE METABOLIC PANEL
Albumin: 3.3 — AB (ref 3.5–5.0)
Calcium: 8.1 — AB (ref 8.7–10.7)

## 2021-10-20 MED ORDER — EPOETIN ALFA-EPBX 40000 UNIT/ML IJ SOLN
40000.0000 [IU] | Freq: Once | INTRAMUSCULAR | Status: AC
  Administered 2021-10-20: 40000 [IU] via SUBCUTANEOUS
  Filled 2021-10-20: qty 1

## 2021-10-20 MED ORDER — FILGRASTIM-SNDZ 480 MCG/0.8ML IJ SOSY
480.0000 ug | PREFILLED_SYRINGE | Freq: Once | INTRAMUSCULAR | Status: AC
  Administered 2021-10-20: 480 ug via SUBCUTANEOUS
  Filled 2021-10-20: qty 0.8

## 2021-10-20 NOTE — Progress Notes (Signed)
Subjective: Maureen Stout is a 88 y.o. female patient with history of diabetes who returns to office today for diabetic nail and wound care chronic.  Patient reports that she has nursing coming to the house to help with changing the dressings and states that they are doing a very good job.  Patient denies any changes like increasing redness warmth swelling or drainage or any other acute symptoms at this time.  Objective: General: Patient is awake, alert, and oriented x 3 and in no acute distress.  Integument: Skin is warm, dry and supple bilateral. Nails are elongated thickened and dystrophic with subungual debris, consistent with onychomycosis, 1-5 on right 1,2,4,5 on left.  Callus with pinpoint bleeding noted to the left plantar forefoot submet 1 and upon debridement measures 0.8 x 0.9 cm with a granular base, no periwound erythema no edema no warmth no malodor and left second toe, partial thickness wound measures 0.2 x 0.2 x 0.2 with a granular base and no surrounding signs of infection, and submet 1 on right that measures 0.3x 0.2x0.2 cm, smaller han previous, all with a granular base, no significant redness, no significant warmth, no malodor.  No acute signs of infection.    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 Asymptomatic bunion and hammertoe pedal deformities noted bilateral. Muscular strength 5/5 in all lower extremity muscular groups bilateral without pain on range of motion. No tenderness with calf compression bilateral.  Assessment and plan Problem List Items Addressed This Visit   None    -Examined patient. -Re-Discussed and educated patient on diabetic foot care, especially with regards to the vascular, neurological and musculoskeletal systems.  -Mechanically debrided all nails x9 using sterile nail nipper without incident and smooth with rotary bur  -For these chronic ulcers, mechanically debrided plantar ulcer left second toe  and left and right forefoot submet 1 using a sterile chisel blade without incident and applied Medihoney and padded gauze dressing and advised patient to refrain from walking barefoot like previous however patient continues to do so which is causing her wounds -Continue with local and home wound care with [facility] as previously ordered with updated wound care instructions as above -Return to office in 10-12 weeks for nail care and wound check or sooner if problems or issues arise -Patient advised to call the office if any problems or questions arise in the meantime.  Landis Martins, DPM

## 2021-10-20 NOTE — Patient Instructions (Signed)
Epoetin Alfa injection What is this medication? EPOETIN ALFA (e POE e tin AL fa) helps your body make more red blood cells. This medicine is used to treat anemia caused by chronic kidney disease, cancer chemotherapy, or HIV-therapy. It may also be used before surgery if you have anemia. This medicine may be used for other purposes; ask your health care provider or pharmacist if you have questions. COMMON BRAND NAME(S): Epogen, Procrit, Retacrit What should I tell my care team before I take this medication? They need to know if you have any of these conditions: cancer heart disease high blood pressure history of blood clots history of stroke low levels of folate, iron, or vitamin B12 in the blood seizures an unusual or allergic reaction to erythropoietin, albumin, benzyl alcohol, hamster proteins, other medicines, foods, dyes, or preservatives pregnant or trying to get pregnant breast-feeding How should I use this medication? This medicine is for injection into a vein or under the skin. It is usually given by a health care professional in a hospital or clinic setting. If you get this medicine at home, you will be taught how to prepare and give this medicine. Use exactly as directed. Take your medicine at regular intervals. Do not take your medicine more often than directed. It is important that you put your used needles and syringes in a special sharps container. Do not put them in a trash can. If you do not have a sharps container, call your pharmacist or healthcare provider to get one. A special MedGuide will be given to you by the pharmacist with each prescription and refill. Be sure to read this information carefully each time. Talk to your pediatrician regarding the use of this medicine in children. While this drug may be prescribed for selected conditions, precautions do apply. Overdosage: If you think you have taken too much of this medicine contact a poison control center or emergency  room at once. NOTE: This medicine is only for you. Do not share this medicine with others. What if I miss a dose? If you miss a dose, take it as soon as you can. If it is almost time for your next dose, take only that dose. Do not take double or extra doses. What may interact with this medication? Interactions have not been studied. This list may not describe all possible interactions. Give your health care provider a list of all the medicines, herbs, non-prescription drugs, or dietary supplements you use. Also tell them if you smoke, drink alcohol, or use illegal drugs. Some items may interact with your medicine. What should I watch for while using this medication? Your condition will be monitored carefully while you are receiving this medicine. You may need blood work done while you are taking this medicine. This medicine may cause a decrease in vitamin B6. You should make sure that you get enough vitamin B6 while you are taking this medicine. Discuss the foods you eat and the vitamins you take with your health care professional. What side effects may I notice from receiving this medication? Side effects that you should report to your doctor or health care professional as soon as possible: allergic reactions like skin rash, itching or hives, swelling of the face, lips, or tongue seizures signs and symptoms of a blood clot such as breathing problems; changes in vision; chest pain; severe, sudden headache; pain, swelling, warmth in the leg; trouble speaking; sudden numbness or weakness of the face, arm or leg signs and symptoms of a stroke like   changes in vision; confusion; trouble speaking or understanding; severe headaches; sudden numbness or weakness of the face, arm or leg; trouble walking; dizziness; loss of balance or coordination Side effects that usually do not require medical attention (report to your doctor or health care professional if they continue or are  bothersome): chills cough dizziness fever headaches joint pain muscle cramps muscle pain nausea, vomiting pain, redness, or irritation at site where injected This list may not describe all possible side effects. Call your doctor for medical advice about side effects. You may report side effects to FDA at 1-800-FDA-1088. Where should I keep my medication? Keep out of the reach of children. Store in a refrigerator between 2 and 8 degrees C (36 and 46 degrees F). Do not freeze or shake. Throw away any unused portion if using a single-dose vial. Multi-dose vials can be kept in the refrigerator for up to 21 days after the initial dose. Throw away unused medicine. NOTE: This sheet is a summary. It may not cover all possible information. If you have questions about this medicine, talk to your doctor, pharmacist, or health care provider.  2023 Elsevier/Gold Standard (2017-01-05 00:00:00) Filgrastim, G-CSF injection What is this medication? FILGRASTIM, G-CSF (fil GRA stim) is a granulocyte colony-stimulating factor that stimulates the growth of neutrophils, a type of white blood cell (WBC) important in the body's fight against infection. It is used to reduce the incidence of fever and infection in patients with certain types of cancer who are receiving chemotherapy that affects the bone marrow, to stimulate blood cell production for removal of WBCs from the body prior to a bone marrow transplantation, to reduce the incidence of fever and infection in patients who have severe chronic neutropenia, and to improve survival outcomes following high-dose radiation exposure that is toxic to the bone marrow. This medicine may be used for other purposes; ask your health care provider or pharmacist if you have questions. COMMON BRAND NAME(S): Neupogen, Nivestym, Releuko, Zarxio What should I tell my care team before I take this medication? They need to know if you have any of these conditions: kidney  disease latex allergy ongoing radiation therapy sickle cell disease an unusual or allergic reaction to filgrastim, pegfilgrastim, other medicines, foods, dyes, or preservatives pregnant or trying to get pregnant breast-feeding How should I use this medication? This medicine is for injection under the skin or infusion into a vein. As an infusion into a vein, it is usually given by a health care professional in a hospital or clinic setting. If you get this medicine at home, you will be taught how to prepare and give this medicine. Refer to the Instructions for Use that come with your medication packaging. Use exactly as directed. Take your medicine at regular intervals. Do not take your medicine more often than directed. It is important that you put your used needles and syringes in a special sharps container. Do not put them in a trash can. If you do not have a sharps container, call your pharmacist or healthcare provider to get one. Talk to your pediatrician regarding the use of this medicine in children. While this drug may be prescribed for children as young as 7 months for selected conditions, precautions do apply. Overdosage: If you think you have taken too much of this medicine contact a poison control center or emergency room at once. NOTE: This medicine is only for you. Do not share this medicine with others. What if I miss a dose? It is important not   to miss your dose. Call your doctor or health care professional if you miss a dose. What may interact with this medication? This medicine may interact with the following medications: medicines that may cause a release of neutrophils, such as lithium This list may not describe all possible interactions. Give your health care provider a list of all the medicines, herbs, non-prescription drugs, or dietary supplements you use. Also tell them if you smoke, drink alcohol, or use illegal drugs. Some items may interact with your medicine. What should  I watch for while using this medication? Your condition will be monitored carefully while you are receiving this medicine. You may need blood work done while you are taking this medicine. Talk to your health care provider about your risk of cancer. You may be more at risk for certain types of cancer if you take this medicine. What side effects may I notice from receiving this medication? Side effects that you should report to your doctor or health care professional as soon as possible: allergic reactions like skin rash, itching or hives, swelling of the face, lips, or tongue back pain dizziness or feeling faint fever pain, redness, or irritation at site where injected pinpoint red spots on the skin shortness of breath or breathing problems signs and symptoms of kidney injury like trouble passing urine, change in the amount of urine, or red or dark-brown urine stomach or side pain, or pain at the shoulder swelling tiredness unusual bleeding or bruising Side effects that usually do not require medical attention (report to your doctor or health care professional if they continue or are bothersome): bone pain cough diarrhea hair loss headache muscle pain This list may not describe all possible side effects. Call your doctor for medical advice about side effects. You may report side effects to FDA at 1-800-FDA-1088. Where should I keep my medication? Keep out of the reach of children. Store in a refrigerator between 2 and 8 degrees C (36 and 46 degrees F). Do not freeze. Keep in carton to protect from light. Throw away this medicine if vials or syringes are left out of the refrigerator for more than 24 hours. Throw away any unused medicine after the expiration date. NOTE: This sheet is a summary. It may not cover all possible information. If you have questions about this medicine, talk to your doctor, pharmacist, or health care provider.  2023 Elsevier/Gold Standard (2021-04-04  00:00:00)  

## 2021-10-27 ENCOUNTER — Inpatient Hospital Stay: Payer: Medicare PPO

## 2021-10-27 ENCOUNTER — Other Ambulatory Visit: Payer: Self-pay

## 2021-10-27 VITALS — BP 153/68 | HR 68 | Temp 97.5°F | Resp 18 | Wt 140.0 lb

## 2021-10-27 DIAGNOSIS — D469 Myelodysplastic syndrome, unspecified: Secondary | ICD-10-CM | POA: Diagnosis not present

## 2021-10-27 DIAGNOSIS — N184 Chronic kidney disease, stage 4 (severe): Secondary | ICD-10-CM | POA: Diagnosis not present

## 2021-10-27 DIAGNOSIS — D631 Anemia in chronic kidney disease: Secondary | ICD-10-CM | POA: Diagnosis not present

## 2021-10-27 DIAGNOSIS — D460 Refractory anemia without ring sideroblasts, so stated: Secondary | ICD-10-CM | POA: Diagnosis not present

## 2021-10-27 DIAGNOSIS — Z79899 Other long term (current) drug therapy: Secondary | ICD-10-CM | POA: Diagnosis not present

## 2021-10-27 LAB — CBC AND DIFFERENTIAL
HCT: 25 — AB (ref 36–46)
Hemoglobin: 7.8 — AB (ref 12.0–16.0)
Neutrophils Absolute: 3.9
Platelets: 286 10*3/uL (ref 150–400)
WBC: 6.1

## 2021-10-27 LAB — CBC: RBC: 2.62 — AB (ref 3.87–5.11)

## 2021-10-27 MED ORDER — EPOETIN ALFA-EPBX 40000 UNIT/ML IJ SOLN
40000.0000 [IU] | Freq: Once | INTRAMUSCULAR | Status: AC
  Administered 2021-10-27: 40000 [IU] via SUBCUTANEOUS
  Filled 2021-10-27: qty 1

## 2021-10-27 MED ORDER — FILGRASTIM-SNDZ 480 MCG/0.8ML IJ SOSY
480.0000 ug | PREFILLED_SYRINGE | Freq: Once | INTRAMUSCULAR | Status: AC
  Administered 2021-10-27: 480 ug via SUBCUTANEOUS
  Filled 2021-10-27: qty 0.8

## 2021-10-27 NOTE — Patient Instructions (Signed)
Filgrastim, G-CSF injection What is this medication? FILGRASTIM, G-CSF (fil GRA stim) is a granulocyte colony-stimulating factor that stimulates the growth of neutrophils, a type of white blood cell (WBC) important in the body's fight against infection. It is used to reduce the incidence of fever and infection in patients with certain types of cancer who are receiving chemotherapy that affects the bone marrow, to stimulate blood cell production for removal of WBCs from the body prior to a bone marrow transplantation, to reduce the incidence of fever and infection in patients who have severe chronic neutropenia, and to improve survival outcomes following high-dose radiation exposure that is toxic to the bone marrow. This medicine may be used for other purposes; ask your health care provider or pharmacist if you have questions. COMMON BRAND NAME(S): Neupogen, Nivestym, Releuko, Zarxio What should I tell my care team before I take this medication? They need to know if you have any of these conditions: kidney disease latex allergy ongoing radiation therapy sickle cell disease an unusual or allergic reaction to filgrastim, pegfilgrastim, other medicines, foods, dyes, or preservatives pregnant or trying to get pregnant breast-feeding How should I use this medication? This medicine is for injection under the skin or infusion into a vein. As an infusion into a vein, it is usually given by a health care professional in a hospital or clinic setting. If you get this medicine at home, you will be taught how to prepare and give this medicine. Refer to the Instructions for Use that come with your medication packaging. Use exactly as directed. Take your medicine at regular intervals. Do not take your medicine more often than directed. It is important that you put your used needles and syringes in a special sharps container. Do not put them in a trash can. If you do not have a sharps container, call your pharmacist  or healthcare provider to get one. Talk to your pediatrician regarding the use of this medicine in children. While this drug may be prescribed for children as young as 7 months for selected conditions, precautions do apply. Overdosage: If you think you have taken too much of this medicine contact a poison control center or emergency room at once. NOTE: This medicine is only for you. Do not share this medicine with others. What if I miss a dose? It is important not to miss your dose. Call your doctor or health care professional if you miss a dose. What may interact with this medication? This medicine may interact with the following medications: medicines that may cause a release of neutrophils, such as lithium This list may not describe all possible interactions. Give your health care provider a list of all the medicines, herbs, non-prescription drugs, or dietary supplements you use. Also tell them if you smoke, drink alcohol, or use illegal drugs. Some items may interact with your medicine. What should I watch for while using this medication? Your condition will be monitored carefully while you are receiving this medicine. You may need blood work done while you are taking this medicine. Talk to your health care provider about your risk of cancer. You may be more at risk for certain types of cancer if you take this medicine. What side effects may I notice from receiving this medication? Side effects that you should report to your doctor or health care professional as soon as possible: allergic reactions like skin rash, itching or hives, swelling of the face, lips, or tongue back pain dizziness or feeling faint fever pain, redness, or   irritation at site where injected pinpoint red spots on the skin shortness of breath or breathing problems signs and symptoms of kidney injury like trouble passing urine, change in the amount of urine, or red or dark-brown urine stomach or side pain, or pain at  the shoulder swelling tiredness unusual bleeding or bruising Side effects that usually do not require medical attention (report to your doctor or health care professional if they continue or are bothersome): bone pain cough diarrhea hair loss headache muscle pain This list may not describe all possible side effects. Call your doctor for medical advice about side effects. You may report side effects to FDA at 1-800-FDA-1088. Where should I keep my medication? Keep out of the reach of children. Store in a refrigerator between 2 and 8 degrees C (36 and 46 degrees F). Do not freeze. Keep in carton to protect from light. Throw away this medicine if vials or syringes are left out of the refrigerator for more than 24 hours. Throw away any unused medicine after the expiration date. NOTE: This sheet is a summary. It may not cover all possible information. If you have questions about this medicine, talk to your doctor, pharmacist, or health care provider.  2023 Elsevier/Gold Standard (2021-04-04 00:00:00) Epoetin Alfa injection What is this medication? EPOETIN ALFA (e POE e tin AL fa) helps your body make more red blood cells. This medicine is used to treat anemia caused by chronic kidney disease, cancer chemotherapy, or HIV-therapy. It may also be used before surgery if you have anemia. This medicine may be used for other purposes; ask your health care provider or pharmacist if you have questions. COMMON BRAND NAME(S): Epogen, Procrit, Retacrit What should I tell my care team before I take this medication? They need to know if you have any of these conditions: cancer heart disease high blood pressure history of blood clots history of stroke low levels of folate, iron, or vitamin B12 in the blood seizures an unusual or allergic reaction to erythropoietin, albumin, benzyl alcohol, hamster proteins, other medicines, foods, dyes, or preservatives pregnant or trying to get  pregnant breast-feeding How should I use this medication? This medicine is for injection into a vein or under the skin. It is usually given by a health care professional in a hospital or clinic setting. If you get this medicine at home, you will be taught how to prepare and give this medicine. Use exactly as directed. Take your medicine at regular intervals. Do not take your medicine more often than directed. It is important that you put your used needles and syringes in a special sharps container. Do not put them in a trash can. If you do not have a sharps container, call your pharmacist or healthcare provider to get one. A special MedGuide will be given to you by the pharmacist with each prescription and refill. Be sure to read this information carefully each time. Talk to your pediatrician regarding the use of this medicine in children. While this drug may be prescribed for selected conditions, precautions do apply. Overdosage: If you think you have taken too much of this medicine contact a poison control center or emergency room at once. NOTE: This medicine is only for you. Do not share this medicine with others. What if I miss a dose? If you miss a dose, take it as soon as you can. If it is almost time for your next dose, take only that dose. Do not take double or extra doses. What may interact   with this medication? Interactions have not been studied. This list may not describe all possible interactions. Give your health care provider a list of all the medicines, herbs, non-prescription drugs, or dietary supplements you use. Also tell them if you smoke, drink alcohol, or use illegal drugs. Some items may interact with your medicine. What should I watch for while using this medication? Your condition will be monitored carefully while you are receiving this medicine. You may need blood work done while you are taking this medicine. This medicine may cause a decrease in vitamin B6. You should make  sure that you get enough vitamin B6 while you are taking this medicine. Discuss the foods you eat and the vitamins you take with your health care professional. What side effects may I notice from receiving this medication? Side effects that you should report to your doctor or health care professional as soon as possible: allergic reactions like skin rash, itching or hives, swelling of the face, lips, or tongue seizures signs and symptoms of a blood clot such as breathing problems; changes in vision; chest pain; severe, sudden headache; pain, swelling, warmth in the leg; trouble speaking; sudden numbness or weakness of the face, arm or leg signs and symptoms of a stroke like changes in vision; confusion; trouble speaking or understanding; severe headaches; sudden numbness or weakness of the face, arm or leg; trouble walking; dizziness; loss of balance or coordination Side effects that usually do not require medical attention (report to your doctor or health care professional if they continue or are bothersome): chills cough dizziness fever headaches joint pain muscle cramps muscle pain nausea, vomiting pain, redness, or irritation at site where injected This list may not describe all possible side effects. Call your doctor for medical advice about side effects. You may report side effects to FDA at 1-800-FDA-1088. Where should I keep my medication? Keep out of the reach of children. Store in a refrigerator between 2 and 8 degrees C (36 and 46 degrees F). Do not freeze or shake. Throw away any unused portion if using a single-dose vial. Multi-dose vials can be kept in the refrigerator for up to 21 days after the initial dose. Throw away unused medicine. NOTE: This sheet is a summary. It may not cover all possible information. If you have questions about this medicine, talk to your doctor, pharmacist, or health care provider.  2023 Elsevier/Gold Standard (2017-01-05 00:00:00)  

## 2021-10-31 ENCOUNTER — Other Ambulatory Visit: Payer: Self-pay | Admitting: Hematology and Oncology

## 2021-10-31 DIAGNOSIS — D469 Myelodysplastic syndrome, unspecified: Secondary | ICD-10-CM

## 2021-11-03 ENCOUNTER — Inpatient Hospital Stay: Payer: Medicare PPO

## 2021-11-03 ENCOUNTER — Other Ambulatory Visit: Payer: Self-pay

## 2021-11-03 VITALS — BP 145/60 | HR 67 | Temp 97.7°F | Resp 18 | Ht 64.0 in | Wt 140.0 lb

## 2021-11-03 DIAGNOSIS — N184 Chronic kidney disease, stage 4 (severe): Secondary | ICD-10-CM | POA: Diagnosis not present

## 2021-11-03 DIAGNOSIS — D649 Anemia, unspecified: Secondary | ICD-10-CM | POA: Diagnosis not present

## 2021-11-03 DIAGNOSIS — D460 Refractory anemia without ring sideroblasts, so stated: Secondary | ICD-10-CM | POA: Diagnosis not present

## 2021-11-03 DIAGNOSIS — D631 Anemia in chronic kidney disease: Secondary | ICD-10-CM | POA: Diagnosis not present

## 2021-11-03 DIAGNOSIS — D638 Anemia in other chronic diseases classified elsewhere: Secondary | ICD-10-CM | POA: Diagnosis not present

## 2021-11-03 DIAGNOSIS — D469 Myelodysplastic syndrome, unspecified: Secondary | ICD-10-CM

## 2021-11-03 DIAGNOSIS — Z79899 Other long term (current) drug therapy: Secondary | ICD-10-CM | POA: Diagnosis not present

## 2021-11-03 LAB — BASIC METABOLIC PANEL
BUN: 54 — AB (ref 4–21)
CO2: 22 (ref 13–22)
Chloride: 104 (ref 99–108)
Creatinine: 2.3 — AB (ref 0.5–1.1)
Glucose: 98
Potassium: 4.1 mEq/L (ref 3.5–5.1)
Sodium: 138 (ref 137–147)

## 2021-11-03 LAB — CBC: RBC: 2.81 — AB (ref 3.87–5.11)

## 2021-11-03 LAB — CBC AND DIFFERENTIAL
HCT: 27 — AB (ref 36–46)
Hemoglobin: 8.5 — AB (ref 12.0–16.0)
Neutrophils Absolute: 2.35
Platelets: 359 10*3/uL (ref 150–400)
WBC: 4.6

## 2021-11-03 LAB — COMPREHENSIVE METABOLIC PANEL
Albumin: 3.3 — AB (ref 3.5–5.0)
Calcium: 7.9 — AB (ref 8.7–10.7)

## 2021-11-03 LAB — HEPATIC FUNCTION PANEL
ALT: 14 U/L (ref 7–35)
AST: 20 (ref 13–35)
Alkaline Phosphatase: 97 (ref 25–125)
Bilirubin, Total: 0.4

## 2021-11-03 MED ORDER — EPOETIN ALFA-EPBX 40000 UNIT/ML IJ SOLN
40000.0000 [IU] | Freq: Once | INTRAMUSCULAR | Status: AC
  Administered 2021-11-03: 40000 [IU] via SUBCUTANEOUS
  Filled 2021-11-03: qty 1

## 2021-11-03 MED ORDER — FILGRASTIM-SNDZ 480 MCG/0.8ML IJ SOSY
480.0000 ug | PREFILLED_SYRINGE | Freq: Once | INTRAMUSCULAR | Status: AC
  Administered 2021-11-03: 480 ug via SUBCUTANEOUS
  Filled 2021-11-03: qty 0.8

## 2021-11-03 NOTE — Patient Instructions (Signed)
Filgrastim, G-CSF injection What is this medication? FILGRASTIM, G-CSF (fil GRA stim) is a granulocyte colony-stimulating factor that stimulates the growth of neutrophils, a type of white blood cell (WBC) important in the body's fight against infection. It is used to reduce the incidence of fever and infection in patients with certain types of cancer who are receiving chemotherapy that affects the bone marrow, to stimulate blood cell production for removal of WBCs from the body prior to a bone marrow transplantation, to reduce the incidence of fever and infection in patients who have severe chronic neutropenia, and to improve survival outcomes following high-dose radiation exposure that is toxic to the bone marrow. This medicine may be used for other purposes; ask your health care provider or pharmacist if you have questions. COMMON BRAND NAME(S): Neupogen, Nivestym, Releuko, Zarxio What should I tell my care team before I take this medication? They need to know if you have any of these conditions: kidney disease latex allergy ongoing radiation therapy sickle cell disease an unusual or allergic reaction to filgrastim, pegfilgrastim, other medicines, foods, dyes, or preservatives pregnant or trying to get pregnant breast-feeding How should I use this medication? This medicine is for injection under the skin or infusion into a vein. As an infusion into a vein, it is usually given by a health care professional in a hospital or clinic setting. If you get this medicine at home, you will be taught how to prepare and give this medicine. Refer to the Instructions for Use that come with your medication packaging. Use exactly as directed. Take your medicine at regular intervals. Do not take your medicine more often than directed. It is important that you put your used needles and syringes in a special sharps container. Do not put them in a trash can. If you do not have a sharps container, call your pharmacist  or healthcare provider to get one. Talk to your pediatrician regarding the use of this medicine in children. While this drug may be prescribed for children as young as 7 months for selected conditions, precautions do apply. Overdosage: If you think you have taken too much of this medicine contact a poison control center or emergency room at once. NOTE: This medicine is only for you. Do not share this medicine with others. What if I miss a dose? It is important not to miss your dose. Call your doctor or health care professional if you miss a dose. What may interact with this medication? This medicine may interact with the following medications: medicines that may cause a release of neutrophils, such as lithium This list may not describe all possible interactions. Give your health care provider a list of all the medicines, herbs, non-prescription drugs, or dietary supplements you use. Also tell them if you smoke, drink alcohol, or use illegal drugs. Some items may interact with your medicine. What should I watch for while using this medication? Your condition will be monitored carefully while you are receiving this medicine. You may need blood work done while you are taking this medicine. Talk to your health care provider about your risk of cancer. You may be more at risk for certain types of cancer if you take this medicine. What side effects may I notice from receiving this medication? Side effects that you should report to your doctor or health care professional as soon as possible: allergic reactions like skin rash, itching or hives, swelling of the face, lips, or tongue back pain dizziness or feeling faint fever pain, redness, or   irritation at site where injected pinpoint red spots on the skin shortness of breath or breathing problems signs and symptoms of kidney injury like trouble passing urine, change in the amount of urine, or red or dark-brown urine stomach or side pain, or pain at  the shoulder swelling tiredness unusual bleeding or bruising Side effects that usually do not require medical attention (report to your doctor or health care professional if they continue or are bothersome): bone pain cough diarrhea hair loss headache muscle pain This list may not describe all possible side effects. Call your doctor for medical advice about side effects. You may report side effects to FDA at 1-800-FDA-1088. Where should I keep my medication? Keep out of the reach of children. Store in a refrigerator between 2 and 8 degrees C (36 and 46 degrees F). Do not freeze. Keep in carton to protect from light. Throw away this medicine if vials or syringes are left out of the refrigerator for more than 24 hours. Throw away any unused medicine after the expiration date. NOTE: This sheet is a summary. It may not cover all possible information. If you have questions about this medicine, talk to your doctor, pharmacist, or health care provider.  2023 Elsevier/Gold Standard (2021-04-04 00:00:00) Epoetin Alfa injection What is this medication? EPOETIN ALFA (e POE e tin AL fa) helps your body make more red blood cells. This medicine is used to treat anemia caused by chronic kidney disease, cancer chemotherapy, or HIV-therapy. It may also be used before surgery if you have anemia. This medicine may be used for other purposes; ask your health care provider or pharmacist if you have questions. COMMON BRAND NAME(S): Epogen, Procrit, Retacrit What should I tell my care team before I take this medication? They need to know if you have any of these conditions: cancer heart disease high blood pressure history of blood clots history of stroke low levels of folate, iron, or vitamin B12 in the blood seizures an unusual or allergic reaction to erythropoietin, albumin, benzyl alcohol, hamster proteins, other medicines, foods, dyes, or preservatives pregnant or trying to get  pregnant breast-feeding How should I use this medication? This medicine is for injection into a vein or under the skin. It is usually given by a health care professional in a hospital or clinic setting. If you get this medicine at home, you will be taught how to prepare and give this medicine. Use exactly as directed. Take your medicine at regular intervals. Do not take your medicine more often than directed. It is important that you put your used needles and syringes in a special sharps container. Do not put them in a trash can. If you do not have a sharps container, call your pharmacist or healthcare provider to get one. A special MedGuide will be given to you by the pharmacist with each prescription and refill. Be sure to read this information carefully each time. Talk to your pediatrician regarding the use of this medicine in children. While this drug may be prescribed for selected conditions, precautions do apply. Overdosage: If you think you have taken too much of this medicine contact a poison control center or emergency room at once. NOTE: This medicine is only for you. Do not share this medicine with others. What if I miss a dose? If you miss a dose, take it as soon as you can. If it is almost time for your next dose, take only that dose. Do not take double or extra doses. What may interact   with this medication? Interactions have not been studied. This list may not describe all possible interactions. Give your health care provider a list of all the medicines, herbs, non-prescription drugs, or dietary supplements you use. Also tell them if you smoke, drink alcohol, or use illegal drugs. Some items may interact with your medicine. What should I watch for while using this medication? Your condition will be monitored carefully while you are receiving this medicine. You may need blood work done while you are taking this medicine. This medicine may cause a decrease in vitamin B6. You should make  sure that you get enough vitamin B6 while you are taking this medicine. Discuss the foods you eat and the vitamins you take with your health care professional. What side effects may I notice from receiving this medication? Side effects that you should report to your doctor or health care professional as soon as possible: allergic reactions like skin rash, itching or hives, swelling of the face, lips, or tongue seizures signs and symptoms of a blood clot such as breathing problems; changes in vision; chest pain; severe, sudden headache; pain, swelling, warmth in the leg; trouble speaking; sudden numbness or weakness of the face, arm or leg signs and symptoms of a stroke like changes in vision; confusion; trouble speaking or understanding; severe headaches; sudden numbness or weakness of the face, arm or leg; trouble walking; dizziness; loss of balance or coordination Side effects that usually do not require medical attention (report to your doctor or health care professional if they continue or are bothersome): chills cough dizziness fever headaches joint pain muscle cramps muscle pain nausea, vomiting pain, redness, or irritation at site where injected This list may not describe all possible side effects. Call your doctor for medical advice about side effects. You may report side effects to FDA at 1-800-FDA-1088. Where should I keep my medication? Keep out of the reach of children. Store in a refrigerator between 2 and 8 degrees C (36 and 46 degrees F). Do not freeze or shake. Throw away any unused portion if using a single-dose vial. Multi-dose vials can be kept in the refrigerator for up to 21 days after the initial dose. Throw away unused medicine. NOTE: This sheet is a summary. It may not cover all possible information. If you have questions about this medicine, talk to your doctor, pharmacist, or health care provider.  2023 Elsevier/Gold Standard (2017-01-05 00:00:00)  

## 2021-11-07 ENCOUNTER — Other Ambulatory Visit: Payer: Self-pay | Admitting: Hematology and Oncology

## 2021-11-07 DIAGNOSIS — D469 Myelodysplastic syndrome, unspecified: Secondary | ICD-10-CM

## 2021-11-09 NOTE — Progress Notes (Signed)
[facility]  [address] [place],  [place]  [address] [phone]  Clinic Day:  11/10/2021  Referring physician: Paulina Fusi, MD  HISTORY OF PRESENT ILLNESS:  The patient is an 88 y.o. female with anemia secondary to chronic renal insufficiency and myelodysplasia.  Iron parameters earlier this year also suggested a component of iron deficiency anemia to where has needed IV iron.  Due to having suboptimal hemoglobin levels, the patient recently had her Zarxio and Retacrit shots increased to once weekly with the hopes that the frequency will lead to improvement in her hemoglobin.  She comes in to reassess her hemoglobin today.  Overall, she claims to be feeling better.  She has not required any blood transfusion over these past few weeks since her weekly injections were restarted.  PHYSICAL EXAM:  Blood pressure (!) 158/70, pulse 77, temperature 98.1 F (36.7 C), resp. rate 14, height 5\' 4"  (1.626 m), weight 140 lb 4.8 oz (63.6 kg), SpO2 98 %. Wt Readings from Last 3 Encounters:  11/10/21 137 lb 1.9 oz (62.2 kg)  11/10/21 140 lb 4.8 oz (63.6 kg)  11/03/21 140 lb (63.5 kg)   Body mass index is 24.08 kg/m. Performance status (ECOG): 2 Physical Exam Constitutional:      Appearance: Normal appearance. She is not ill-appearing.  HENT:     Mouth/Throat:     Mouth: Mucous membranes are moist.     Pharynx: Oropharynx is clear. No oropharyngeal exudate or posterior oropharyngeal erythema.  Cardiovascular:     Rate and Rhythm: Normal rate and regular rhythm.     Heart sounds: No murmur heard.    No friction rub. No gallop.  Pulmonary:     Effort: Pulmonary effort is normal. No respiratory distress.     Breath sounds: Normal breath sounds. No wheezing, rhonchi or rales.  Abdominal:     General: Bowel sounds are normal. There is no distension.     Palpations: Abdomen is soft. There is no mass.     Tenderness: There is no abdominal tenderness.   Musculoskeletal:        General: No swelling.     Right lower leg: No swelling. No edema.     Left lower leg: No edema.  Lymphadenopathy:     Cervical: No cervical adenopathy.     Upper Body:     Right upper body: No supraclavicular or axillary adenopathy.     Left upper body: No supraclavicular or axillary adenopathy.     Lower Body: No right inguinal adenopathy. No left inguinal adenopathy.  Skin:    General: Skin is warm.     Coloration: Skin is not jaundiced.     Findings: No lesion or rash.  Neurological:     General: No focal deficit present.     Mental Status: She is alert and oriented to person, place, and time. Mental status is at baseline.  Psychiatric:        Mood and Affect: Mood normal.        Behavior: Behavior normal.        Thought Content: Thought content normal.    LABS:        Latest Ref Rng & Units 11/03/2021   12:00 AM 10/27/2021   12:00 AM 10/20/2021   12:00 AM  CBC  WBC  4.6     6.1     8.7      Hemoglobin 12.0 - 16.0 8.5     7.8  7.5      Hematocrit 36 - 46 27     25     24       Platelets 150 - 400 K/uL 359     286     311         This result is from an external source.      Latest Ref Rng & Units 11/03/2021   12:00 AM 10/20/2021   12:00 AM 06/06/2021   12:00 AM  CMP  BUN 4 - 21 54     54     59      Creatinine 0.5 - 1.1 2.3     2.1     2.4      Sodium 137 - 147 138     140     137      Potassium 3.5 - 5.1 mEq/L 4.1     4.6     4.4      Chloride 99 - 108 104     106     107      CO2 13 - 22 22     25     22       Calcium 8.7 - 10.7 7.9     8.1     8.2      Alkaline Phos 25 - 125 97     95     111      AST 13 - 35 20     23     25       ALT 7 - 35 U/L 14     17     17          This result is from an external source.   Assessment/Plan:  An 88 y.o. female with anemia secondary to both renal insufficiency, iron deficiency and myelodysplasia.  When evaluating her labs today, I am pleased with the progressive rise in her hemoglobin.  As mentioned  previously, she is not receiving Zarxio/Retacrit injections on a weekly basis to see if there is synergy with the shots that can lead to an improvement in her hemoglobin.  As her hemoglobin is better, she will continue to receive these 2 shots on a weekly basis.  Her CBC will continue to be followed every 4 weeks.  I will see her back in 8 weeks for repeat clinical assessment.  The patient understands all the plans discussed today and is in agreement with them.  Taelynn Mcelhannon Kirby Funk, MD

## 2021-11-10 ENCOUNTER — Inpatient Hospital Stay: Payer: Medicare PPO

## 2021-11-10 ENCOUNTER — Inpatient Hospital Stay: Payer: Medicare PPO | Admitting: Oncology

## 2021-11-10 VITALS — BP 149/72 | HR 66 | Temp 97.7°F | Resp 18 | Wt 137.1 lb

## 2021-11-10 VITALS — BP 158/70 | HR 77 | Temp 98.1°F | Resp 14 | Ht 64.0 in | Wt 140.3 lb

## 2021-11-10 DIAGNOSIS — D469 Myelodysplastic syndrome, unspecified: Secondary | ICD-10-CM

## 2021-11-10 DIAGNOSIS — D631 Anemia in chronic kidney disease: Secondary | ICD-10-CM

## 2021-11-10 DIAGNOSIS — D460 Refractory anemia without ring sideroblasts, so stated: Secondary | ICD-10-CM | POA: Diagnosis not present

## 2021-11-10 DIAGNOSIS — Z79899 Other long term (current) drug therapy: Secondary | ICD-10-CM | POA: Diagnosis not present

## 2021-11-10 DIAGNOSIS — N184 Chronic kidney disease, stage 4 (severe): Secondary | ICD-10-CM | POA: Diagnosis not present

## 2021-11-10 MED ORDER — FILGRASTIM-SNDZ 480 MCG/0.8ML IJ SOSY
480.0000 ug | PREFILLED_SYRINGE | Freq: Once | INTRAMUSCULAR | Status: AC
  Administered 2021-11-10: 480 ug via SUBCUTANEOUS
  Filled 2021-11-10: qty 0.8

## 2021-11-10 MED ORDER — EPOETIN ALFA-EPBX 40000 UNIT/ML IJ SOLN
40000.0000 [IU] | Freq: Once | INTRAMUSCULAR | Status: AC
  Administered 2021-11-10: 40000 [IU] via SUBCUTANEOUS
  Filled 2021-11-10: qty 1

## 2021-11-17 ENCOUNTER — Inpatient Hospital Stay: Payer: Medicare PPO

## 2021-11-17 ENCOUNTER — Inpatient Hospital Stay: Payer: Medicare PPO | Attending: Oncology

## 2021-11-17 ENCOUNTER — Other Ambulatory Visit: Payer: Self-pay | Admitting: Hematology and Oncology

## 2021-11-17 VITALS — BP 133/56 | HR 70 | Temp 97.6°F | Resp 16 | Wt 141.0 lb

## 2021-11-17 DIAGNOSIS — D631 Anemia in chronic kidney disease: Secondary | ICD-10-CM | POA: Diagnosis not present

## 2021-11-17 DIAGNOSIS — D460 Refractory anemia without ring sideroblasts, so stated: Secondary | ICD-10-CM | POA: Insufficient documentation

## 2021-11-17 DIAGNOSIS — D469 Myelodysplastic syndrome, unspecified: Secondary | ICD-10-CM

## 2021-11-17 DIAGNOSIS — N184 Chronic kidney disease, stage 4 (severe): Secondary | ICD-10-CM | POA: Diagnosis not present

## 2021-11-17 DIAGNOSIS — Z79899 Other long term (current) drug therapy: Secondary | ICD-10-CM | POA: Insufficient documentation

## 2021-11-17 LAB — CBC AND DIFFERENTIAL
HCT: 31 — AB (ref 36–46)
Hemoglobin: 9.3 — AB (ref 12.0–16.0)
Neutrophils Absolute: 2.35
Platelets: 272 10*3/uL (ref 150–400)
WBC: 4.7

## 2021-11-17 LAB — CBC: RBC: 3.16 — AB (ref 3.87–5.11)

## 2021-11-17 MED ORDER — EPOETIN ALFA-EPBX 40000 UNIT/ML IJ SOLN
40000.0000 [IU] | Freq: Once | INTRAMUSCULAR | Status: AC
  Administered 2021-11-17: 40000 [IU] via SUBCUTANEOUS
  Filled 2021-11-17: qty 1

## 2021-11-17 MED ORDER — FILGRASTIM-SNDZ 480 MCG/0.8ML IJ SOSY
480.0000 ug | PREFILLED_SYRINGE | Freq: Once | INTRAMUSCULAR | Status: AC
  Administered 2021-11-17: 480 ug via SUBCUTANEOUS
  Filled 2021-11-17: qty 0.8

## 2021-11-17 NOTE — Patient Instructions (Signed)
Epoetin Alfa injection What is this medication? EPOETIN ALFA (e POE e tin AL fa) helps your body make more red blood cells. This medicine is used to treat anemia caused by chronic kidney disease, cancer chemotherapy, or HIV-therapy. It may also be used before surgery if you have anemia. This medicine may be used for other purposes; ask your health care provider or pharmacist if you have questions. COMMON BRAND NAME(S): Epogen, Procrit, Retacrit What should I tell my care team before I take this medication? They need to know if you have any of these conditions: cancer heart disease high blood pressure history of blood clots history of stroke low levels of folate, iron, or vitamin B12 in the blood seizures an unusual or allergic reaction to erythropoietin, albumin, benzyl alcohol, hamster proteins, other medicines, foods, dyes, or preservatives pregnant or trying to get pregnant breast-feeding How should I use this medication? This medicine is for injection into a vein or under the skin. It is usually given by a health care professional in a hospital or clinic setting. If you get this medicine at home, you will be taught how to prepare and give this medicine. Use exactly as directed. Take your medicine at regular intervals. Do not take your medicine more often than directed. It is important that you put your used needles and syringes in a special sharps container. Do not put them in a trash can. If you do not have a sharps container, call your pharmacist or healthcare provider to get one. A special MedGuide will be given to you by the pharmacist with each prescription and refill. Be sure to read this information carefully each time. Talk to your pediatrician regarding the use of this medicine in children. While this drug may be prescribed for selected conditions, precautions do apply. Overdosage: If you think you have taken too much of this medicine contact a poison control center or emergency  room at once. NOTE: This medicine is only for you. Do not share this medicine with others. What if I miss a dose? If you miss a dose, take it as soon as you can. If it is almost time for your next dose, take only that dose. Do not take double or extra doses. What may interact with this medication? Interactions have not been studied. This list may not describe all possible interactions. Give your health care provider a list of all the medicines, herbs, non-prescription drugs, or dietary supplements you use. Also tell them if you smoke, drink alcohol, or use illegal drugs. Some items may interact with your medicine. What should I watch for while using this medication? Your condition will be monitored carefully while you are receiving this medicine. You may need blood work done while you are taking this medicine. This medicine may cause a decrease in vitamin B6. You should make sure that you get enough vitamin B6 while you are taking this medicine. Discuss the foods you eat and the vitamins you take with your health care professional. What side effects may I notice from receiving this medication? Side effects that you should report to your doctor or health care professional as soon as possible: allergic reactions like skin rash, itching or hives, swelling of the face, lips, or tongue seizures signs and symptoms of a blood clot such as breathing problems; changes in vision; chest pain; severe, sudden headache; pain, swelling, warmth in the leg; trouble speaking; sudden numbness or weakness of the face, arm or leg signs and symptoms of a stroke like   changes in vision; confusion; trouble speaking or understanding; severe headaches; sudden numbness or weakness of the face, arm or leg; trouble walking; dizziness; loss of balance or coordination Side effects that usually do not require medical attention (report to your doctor or health care professional if they continue or are  bothersome): chills cough dizziness fever headaches joint pain muscle cramps muscle pain nausea, vomiting pain, redness, or irritation at site where injected This list may not describe all possible side effects. Call your doctor for medical advice about side effects. You may report side effects to FDA at 1-800-FDA-1088. Where should I keep my medication? Keep out of the reach of children. Store in a refrigerator between 2 and 8 degrees C (36 and 46 degrees F). Do not freeze or shake. Throw away any unused portion if using a single-dose vial. Multi-dose vials can be kept in the refrigerator for up to 21 days after the initial dose. Throw away unused medicine. NOTE: This sheet is a summary. It may not cover all possible information. If you have questions about this medicine, talk to your doctor, pharmacist, or health care provider.  2023 Elsevier/Gold Standard (2017-01-05 00:00:00) Filgrastim, G-CSF injection What is this medication? FILGRASTIM, G-CSF (fil GRA stim) is a granulocyte colony-stimulating factor that stimulates the growth of neutrophils, a type of white blood cell (WBC) important in the body's fight against infection. It is used to reduce the incidence of fever and infection in patients with certain types of cancer who are receiving chemotherapy that affects the bone marrow, to stimulate blood cell production for removal of WBCs from the body prior to a bone marrow transplantation, to reduce the incidence of fever and infection in patients who have severe chronic neutropenia, and to improve survival outcomes following high-dose radiation exposure that is toxic to the bone marrow. This medicine may be used for other purposes; ask your health care provider or pharmacist if you have questions. COMMON BRAND NAME(S): Neupogen, Nivestym, Releuko, Zarxio What should I tell my care team before I take this medication? They need to know if you have any of these conditions: kidney  disease latex allergy ongoing radiation therapy sickle cell disease an unusual or allergic reaction to filgrastim, pegfilgrastim, other medicines, foods, dyes, or preservatives pregnant or trying to get pregnant breast-feeding How should I use this medication? This medicine is for injection under the skin or infusion into a vein. As an infusion into a vein, it is usually given by a health care professional in a hospital or clinic setting. If you get this medicine at home, you will be taught how to prepare and give this medicine. Refer to the Instructions for Use that come with your medication packaging. Use exactly as directed. Take your medicine at regular intervals. Do not take your medicine more often than directed. It is important that you put your used needles and syringes in a special sharps container. Do not put them in a trash can. If you do not have a sharps container, call your pharmacist or healthcare provider to get one. Talk to your pediatrician regarding the use of this medicine in children. While this drug may be prescribed for children as young as 7 months for selected conditions, precautions do apply. Overdosage: If you think you have taken too much of this medicine contact a poison control center or emergency room at once. NOTE: This medicine is only for you. Do not share this medicine with others. What if I miss a dose? It is important not  to miss your dose. Call your doctor or health care professional if you miss a dose. What may interact with this medication? This medicine may interact with the following medications: medicines that may cause a release of neutrophils, such as lithium This list may not describe all possible interactions. Give your health care provider a list of all the medicines, herbs, non-prescription drugs, or dietary supplements you use. Also tell them if you smoke, drink alcohol, or use illegal drugs. Some items may interact with your medicine. What should  I watch for while using this medication? Your condition will be monitored carefully while you are receiving this medicine. You may need blood work done while you are taking this medicine. Talk to your health care provider about your risk of cancer. You may be more at risk for certain types of cancer if you take this medicine. What side effects may I notice from receiving this medication? Side effects that you should report to your doctor or health care professional as soon as possible: allergic reactions like skin rash, itching or hives, swelling of the face, lips, or tongue back pain dizziness or feeling faint fever pain, redness, or irritation at site where injected pinpoint red spots on the skin shortness of breath or breathing problems signs and symptoms of kidney injury like trouble passing urine, change in the amount of urine, or red or dark-brown urine stomach or side pain, or pain at the shoulder swelling tiredness unusual bleeding or bruising Side effects that usually do not require medical attention (report to your doctor or health care professional if they continue or are bothersome): bone pain cough diarrhea hair loss headache muscle pain This list may not describe all possible side effects. Call your doctor for medical advice about side effects. You may report side effects to FDA at 1-800-FDA-1088. Where should I keep my medication? Keep out of the reach of children. Store in a refrigerator between 2 and 8 degrees C (36 and 46 degrees F). Do not freeze. Keep in carton to protect from light. Throw away this medicine if vials or syringes are left out of the refrigerator for more than 24 hours. Throw away any unused medicine after the expiration date. NOTE: This sheet is a summary. It may not cover all possible information. If you have questions about this medicine, talk to your doctor, pharmacist, or health care provider.  2023 Elsevier/Gold Standard (2021-04-04  00:00:00)

## 2021-11-24 ENCOUNTER — Inpatient Hospital Stay: Payer: Medicare PPO

## 2021-11-24 ENCOUNTER — Other Ambulatory Visit: Payer: Self-pay | Admitting: Hematology and Oncology

## 2021-11-24 VITALS — BP 156/74 | HR 68 | Temp 97.6°F | Resp 18 | Ht 64.0 in | Wt 140.8 lb

## 2021-11-24 DIAGNOSIS — D469 Myelodysplastic syndrome, unspecified: Secondary | ICD-10-CM

## 2021-11-24 DIAGNOSIS — Z79899 Other long term (current) drug therapy: Secondary | ICD-10-CM | POA: Diagnosis not present

## 2021-11-24 DIAGNOSIS — N184 Chronic kidney disease, stage 4 (severe): Secondary | ICD-10-CM | POA: Diagnosis not present

## 2021-11-24 DIAGNOSIS — D631 Anemia in chronic kidney disease: Secondary | ICD-10-CM | POA: Diagnosis not present

## 2021-11-24 DIAGNOSIS — D460 Refractory anemia without ring sideroblasts, so stated: Secondary | ICD-10-CM | POA: Diagnosis not present

## 2021-11-24 LAB — CBC: RBC: 3.53 — AB (ref 3.87–5.11)

## 2021-11-24 LAB — CBC AND DIFFERENTIAL
HCT: 34 — AB (ref 36–46)
Hemoglobin: 10.3 — AB (ref 12.0–16.0)
Neutrophils Absolute: 2.79
Platelets: 292 10*3/uL (ref 150–400)
WBC: 5.7

## 2021-11-24 MED ORDER — EPOETIN ALFA-EPBX 40000 UNIT/ML IJ SOLN
40000.0000 [IU] | Freq: Once | INTRAMUSCULAR | Status: AC
  Administered 2021-11-24: 40000 [IU] via SUBCUTANEOUS
  Filled 2021-11-24: qty 1

## 2021-11-24 MED ORDER — FILGRASTIM-SNDZ 480 MCG/0.8ML IJ SOSY
480.0000 ug | PREFILLED_SYRINGE | Freq: Once | INTRAMUSCULAR | Status: AC
  Administered 2021-11-24: 480 ug via SUBCUTANEOUS
  Filled 2021-11-24: qty 0.8

## 2021-11-24 NOTE — Patient Instructions (Signed)
Epoetin Alfa injection What is this medication? EPOETIN ALFA (e POE e tin AL fa) helps your body make more red blood cells. This medicine is used to treat anemia caused by chronic kidney disease, cancer chemotherapy, or HIV-therapy. It may also be used before surgery if you have anemia. This medicine may be used for other purposes; ask your health care provider or pharmacist if you have questions. COMMON BRAND NAME(S): Epogen, Procrit, Retacrit What should I tell my care team before I take this medication? They need to know if you have any of these conditions: cancer heart disease high blood pressure history of blood clots history of stroke low levels of folate, iron, or vitamin B12 in the blood seizures an unusual or allergic reaction to erythropoietin, albumin, benzyl alcohol, hamster proteins, other medicines, foods, dyes, or preservatives pregnant or trying to get pregnant breast-feeding How should I use this medication? This medicine is for injection into a vein or under the skin. It is usually given by a health care professional in a hospital or clinic setting. If you get this medicine at home, you will be taught how to prepare and give this medicine. Use exactly as directed. Take your medicine at regular intervals. Do not take your medicine more often than directed. It is important that you put your used needles and syringes in a special sharps container. Do not put them in a trash can. If you do not have a sharps container, call your pharmacist or healthcare provider to get one. A special MedGuide will be given to you by the pharmacist with each prescription and refill. Be sure to read this information carefully each time. Talk to your pediatrician regarding the use of this medicine in children. While this drug may be prescribed for selected conditions, precautions do apply. Overdosage: If you think you have taken too much of this medicine contact a poison control center or emergency  room at once. NOTE: This medicine is only for you. Do not share this medicine with others. What if I miss a dose? If you miss a dose, take it as soon as you can. If it is almost time for your next dose, take only that dose. Do not take double or extra doses. What may interact with this medication? Interactions have not been studied. This list may not describe all possible interactions. Give your health care provider a list of all the medicines, herbs, non-prescription drugs, or dietary supplements you use. Also tell them if you smoke, drink alcohol, or use illegal drugs. Some items may interact with your medicine. What should I watch for while using this medication? Your condition will be monitored carefully while you are receiving this medicine. You may need blood work done while you are taking this medicine. This medicine may cause a decrease in vitamin B6. You should make sure that you get enough vitamin B6 while you are taking this medicine. Discuss the foods you eat and the vitamins you take with your health care professional. What side effects may I notice from receiving this medication? Side effects that you should report to your doctor or health care professional as soon as possible: allergic reactions like skin rash, itching or hives, swelling of the face, lips, or tongue seizures signs and symptoms of a blood clot such as breathing problems; changes in vision; chest pain; severe, sudden headache; pain, swelling, warmth in the leg; trouble speaking; sudden numbness or weakness of the face, arm or leg signs and symptoms of a stroke like   changes in vision; confusion; trouble speaking or understanding; severe headaches; sudden numbness or weakness of the face, arm or leg; trouble walking; dizziness; loss of balance or coordination Side effects that usually do not require medical attention (report to your doctor or health care professional if they continue or are  bothersome): chills cough dizziness fever headaches joint pain muscle cramps muscle pain nausea, vomiting pain, redness, or irritation at site where injected This list may not describe all possible side effects. Call your doctor for medical advice about side effects. You may report side effects to FDA at 1-800-FDA-1088. Where should I keep my medication? Keep out of the reach of children. Store in a refrigerator between 2 and 8 degrees C (36 and 46 degrees F). Do not freeze or shake. Throw away any unused portion if using a single-dose vial. Multi-dose vials can be kept in the refrigerator for up to 21 days after the initial dose. Throw away unused medicine. NOTE: This sheet is a summary. It may not cover all possible information. If you have questions about this medicine, talk to your doctor, pharmacist, or health care provider.  2023 Elsevier/Gold Standard (2017-01-05 00:00:00) Filgrastim, G-CSF injection What is this medication? FILGRASTIM, G-CSF (fil GRA stim) is a granulocyte colony-stimulating factor that stimulates the growth of neutrophils, a type of white blood cell (WBC) important in the body's fight against infection. It is used to reduce the incidence of fever and infection in patients with certain types of cancer who are receiving chemotherapy that affects the bone marrow, to stimulate blood cell production for removal of WBCs from the body prior to a bone marrow transplantation, to reduce the incidence of fever and infection in patients who have severe chronic neutropenia, and to improve survival outcomes following high-dose radiation exposure that is toxic to the bone marrow. This medicine may be used for other purposes; ask your health care provider or pharmacist if you have questions. COMMON BRAND NAME(S): Neupogen, Nivestym, Releuko, Zarxio What should I tell my care team before I take this medication? They need to know if you have any of these conditions: kidney  disease latex allergy ongoing radiation therapy sickle cell disease an unusual or allergic reaction to filgrastim, pegfilgrastim, other medicines, foods, dyes, or preservatives pregnant or trying to get pregnant breast-feeding How should I use this medication? This medicine is for injection under the skin or infusion into a vein. As an infusion into a vein, it is usually given by a health care professional in a hospital or clinic setting. If you get this medicine at home, you will be taught how to prepare and give this medicine. Refer to the Instructions for Use that come with your medication packaging. Use exactly as directed. Take your medicine at regular intervals. Do not take your medicine more often than directed. It is important that you put your used needles and syringes in a special sharps container. Do not put them in a trash can. If you do not have a sharps container, call your pharmacist or healthcare provider to get one. Talk to your pediatrician regarding the use of this medicine in children. While this drug may be prescribed for children as young as 7 months for selected conditions, precautions do apply. Overdosage: If you think you have taken too much of this medicine contact a poison control center or emergency room at once. NOTE: This medicine is only for you. Do not share this medicine with others. What if I miss a dose? It is important not  to miss your dose. Call your doctor or health care professional if you miss a dose. What may interact with this medication? This medicine may interact with the following medications: medicines that may cause a release of neutrophils, such as lithium This list may not describe all possible interactions. Give your health care provider a list of all the medicines, herbs, non-prescription drugs, or dietary supplements you use. Also tell them if you smoke, drink alcohol, or use illegal drugs. Some items may interact with your medicine. What should  I watch for while using this medication? Your condition will be monitored carefully while you are receiving this medicine. You may need blood work done while you are taking this medicine. Talk to your health care provider about your risk of cancer. You may be more at risk for certain types of cancer if you take this medicine. What side effects may I notice from receiving this medication? Side effects that you should report to your doctor or health care professional as soon as possible: allergic reactions like skin rash, itching or hives, swelling of the face, lips, or tongue back pain dizziness or feeling faint fever pain, redness, or irritation at site where injected pinpoint red spots on the skin shortness of breath or breathing problems signs and symptoms of kidney injury like trouble passing urine, change in the amount of urine, or red or dark-brown urine stomach or side pain, or pain at the shoulder swelling tiredness unusual bleeding or bruising Side effects that usually do not require medical attention (report to your doctor or health care professional if they continue or are bothersome): bone pain cough diarrhea hair loss headache muscle pain This list may not describe all possible side effects. Call your doctor for medical advice about side effects. You may report side effects to FDA at 1-800-FDA-1088. Where should I keep my medication? Keep out of the reach of children. Store in a refrigerator between 2 and 8 degrees C (36 and 46 degrees F). Do not freeze. Keep in carton to protect from light. Throw away this medicine if vials or syringes are left out of the refrigerator for more than 24 hours. Throw away any unused medicine after the expiration date. NOTE: This sheet is a summary. It may not cover all possible information. If you have questions about this medicine, talk to your doctor, pharmacist, or health care provider.  2023 Elsevier/Gold Standard (2021-04-04  00:00:00)

## 2021-11-24 NOTE — Addendum Note (Signed)
Addended by: Juanetta Beets on: 11/24/2021 02:28 PM   Modules accepted: Orders

## 2021-12-01 ENCOUNTER — Inpatient Hospital Stay: Payer: Medicare PPO

## 2021-12-01 ENCOUNTER — Other Ambulatory Visit: Payer: Self-pay | Admitting: Pharmacist

## 2021-12-01 VITALS — BP 148/71 | HR 72 | Temp 97.6°F | Resp 18 | Ht 64.0 in | Wt 139.8 lb

## 2021-12-01 DIAGNOSIS — D631 Anemia in chronic kidney disease: Secondary | ICD-10-CM

## 2021-12-01 DIAGNOSIS — D460 Refractory anemia without ring sideroblasts, so stated: Secondary | ICD-10-CM | POA: Diagnosis not present

## 2021-12-01 DIAGNOSIS — N184 Chronic kidney disease, stage 4 (severe): Secondary | ICD-10-CM | POA: Diagnosis not present

## 2021-12-01 DIAGNOSIS — Z79899 Other long term (current) drug therapy: Secondary | ICD-10-CM | POA: Diagnosis not present

## 2021-12-01 LAB — CBC AND DIFFERENTIAL
HCT: 34 — AB (ref 36–46)
Hemoglobin: 10.6 — AB (ref 12.0–16.0)
Neutrophils Absolute: 3.35
Platelets: 265 10*3/uL (ref 150–400)
WBC: 6.2

## 2021-12-01 LAB — CBC: RBC: 3.68 — AB (ref 3.87–5.11)

## 2021-12-01 MED ORDER — FILGRASTIM-SNDZ 480 MCG/0.8ML IJ SOSY
480.0000 ug | PREFILLED_SYRINGE | Freq: Once | INTRAMUSCULAR | Status: AC
  Administered 2021-12-01: 480 ug via SUBCUTANEOUS
  Filled 2021-12-01: qty 0.8

## 2021-12-01 MED ORDER — EPOETIN ALFA-EPBX 40000 UNIT/ML IJ SOLN
40000.0000 [IU] | Freq: Once | INTRAMUSCULAR | Status: AC
  Administered 2021-12-01: 40000 [IU] via SUBCUTANEOUS

## 2021-12-01 NOTE — Patient Instructions (Signed)
Filgrastim, G-CSF injection What is this medication? FILGRASTIM, G-CSF (fil GRA stim) is a granulocyte colony-stimulating factor that stimulates the growth of neutrophils, a type of white blood cell (WBC) important in the body's fight against infection. It is used to reduce the incidence of fever and infection in patients with certain types of cancer who are receiving chemotherapy that affects the bone marrow, to stimulate blood cell production for removal of WBCs from the body prior to a bone marrow transplantation, to reduce the incidence of fever and infection in patients who have severe chronic neutropenia, and to improve survival outcomes following high-dose radiation exposure that is toxic to the bone marrow. This medicine may be used for other purposes; ask your health care provider or pharmacist if you have questions. COMMON BRAND NAME(S): Neupogen, Nivestym, Releuko, Zarxio What should I tell my care team before I take this medication? They need to know if you have any of these conditions: kidney disease latex allergy ongoing radiation therapy sickle cell disease an unusual or allergic reaction to filgrastim, pegfilgrastim, other medicines, foods, dyes, or preservatives pregnant or trying to get pregnant breast-feeding How should I use this medication? This medicine is for injection under the skin or infusion into a vein. As an infusion into a vein, it is usually given by a health care professional in a hospital or clinic setting. If you get this medicine at home, you will be taught how to prepare and give this medicine. Refer to the Instructions for Use that come with your medication packaging. Use exactly as directed. Take your medicine at regular intervals. Do not take your medicine more often than directed. It is important that you put your used needles and syringes in a special sharps container. Do not put them in a trash can. If you do not have a sharps container, call your pharmacist  or healthcare provider to get one. Talk to your pediatrician regarding the use of this medicine in children. While this drug may be prescribed for children as young as 7 months for selected conditions, precautions do apply. Overdosage: If you think you have taken too much of this medicine contact a poison control center or emergency room at once. NOTE: This medicine is only for you. Do not share this medicine with others. What if I miss a dose? It is important not to miss your dose. Call your doctor or health care professional if you miss a dose. What may interact with this medication? This medicine may interact with the following medications: medicines that may cause a release of neutrophils, such as lithium This list may not describe all possible interactions. Give your health care provider a list of all the medicines, herbs, non-prescription drugs, or dietary supplements you use. Also tell them if you smoke, drink alcohol, or use illegal drugs. Some items may interact with your medicine. What should I watch for while using this medication? Your condition will be monitored carefully while you are receiving this medicine. You may need blood work done while you are taking this medicine. Talk to your health care provider about your risk of cancer. You may be more at risk for certain types of cancer if you take this medicine. What side effects may I notice from receiving this medication? Side effects that you should report to your doctor or health care professional as soon as possible: allergic reactions like skin rash, itching or hives, swelling of the face, lips, or tongue back pain dizziness or feeling faint fever pain, redness, or   irritation at site where injected pinpoint red spots on the skin shortness of breath or breathing problems signs and symptoms of kidney injury like trouble passing urine, change in the amount of urine, or red or dark-brown urine stomach or side pain, or pain at  the shoulder swelling tiredness unusual bleeding or bruising Side effects that usually do not require medical attention (report to your doctor or health care professional if they continue or are bothersome): bone pain cough diarrhea hair loss headache muscle pain This list may not describe all possible side effects. Call your doctor for medical advice about side effects. You may report side effects to FDA at 1-800-FDA-1088. Where should I keep my medication? Keep out of the reach of children. Store in a refrigerator between 2 and 8 degrees C (36 and 46 degrees F). Do not freeze. Keep in carton to protect from light. Throw away this medicine if vials or syringes are left out of the refrigerator for more than 24 hours. Throw away any unused medicine after the expiration date. NOTE: This sheet is a summary. It may not cover all possible information. If you have questions about this medicine, talk to your doctor, pharmacist, or health care provider.  2023 [employer] Standard (2021-04-04 00:00:00) Epoetin Alfa injection What is this medication? EPOETIN ALFA (e POE e tin AL fa) helps your body make more red blood cells. This medicine is used to treat anemia caused by chronic kidney disease, cancer chemotherapy, or HIV-therapy. It may also be used before surgery if you have anemia. This medicine may be used for other purposes; ask your health care provider or pharmacist if you have questions. COMMON BRAND NAME(S): Epogen, Procrit, Retacrit What should I tell my care team before I take this medication? They need to know if you have any of these conditions: cancer heart disease high blood pressure history of blood clots history of stroke low levels of folate, iron, or vitamin B12 in the blood seizures an unusual or allergic reaction to erythropoietin, albumin, benzyl alcohol, hamster proteins, other medicines, foods, dyes, or preservatives pregnant or trying to get  pregnant breast-feeding How should I use this medication? This medicine is for injection into a vein or under the skin. It is usually given by a health care professional in a hospital or clinic setting. If you get this medicine at home, you will be taught how to prepare and give this medicine. Use exactly as directed. Take your medicine at regular intervals. Do not take your medicine more often than directed. It is important that you put your used needles and syringes in a special sharps container. Do not put them in a trash can. If you do not have a sharps container, call your pharmacist or healthcare provider to get one. A special MedGuide will be given to you by the pharmacist with each prescription and refill. Be sure to read this information carefully each time. Talk to your pediatrician regarding the use of this medicine in children. While this drug may be prescribed for selected conditions, precautions do apply. Overdosage: If you think you have taken too much of this medicine contact a poison control center or emergency room at once. NOTE: This medicine is only for you. Do not share this medicine with others. What if I miss a dose? If you miss a dose, take it as soon as you can. If it is almost time for your next dose, take only that dose. Do not take double or extra doses. What may interact  with this medication? Interactions have not been studied. This list may not describe all possible interactions. Give your health care provider a list of all the medicines, herbs, non-prescription drugs, or dietary supplements you use. Also tell them if you smoke, drink alcohol, or use illegal drugs. Some items may interact with your medicine. What should I watch for while using this medication? Your condition will be monitored carefully while you are receiving this medicine. You may need blood work done while you are taking this medicine. This medicine may cause a decrease in vitamin B6. You should make  sure that you get enough vitamin B6 while you are taking this medicine. Discuss the foods you eat and the vitamins you take with your health care professional. What side effects may I notice from receiving this medication? Side effects that you should report to your doctor or health care professional as soon as possible: allergic reactions like skin rash, itching or hives, swelling of the face, lips, or tongue seizures signs and symptoms of a blood clot such as breathing problems; changes in vision; chest pain; severe, sudden headache; pain, swelling, warmth in the leg; trouble speaking; sudden numbness or weakness of the face, arm or leg signs and symptoms of a stroke like changes in vision; confusion; trouble speaking or understanding; severe headaches; sudden numbness or weakness of the face, arm or leg; trouble walking; dizziness; loss of balance or coordination Side effects that usually do not require medical attention (report to your doctor or health care professional if they continue or are bothersome): chills cough dizziness fever headaches joint pain muscle cramps muscle pain nausea, vomiting pain, redness, or irritation at site where injected This list may not describe all possible side effects. Call your doctor for medical advice about side effects. You may report side effects to FDA at 1-800-FDA-1088. Where should I keep my medication? Keep out of the reach of children. Store in a refrigerator between 2 and 8 degrees C (36 and 46 degrees F). Do not freeze or shake. Throw away any unused portion if using a single-dose vial. Multi-dose vials can be kept in the refrigerator for up to 21 days after the initial dose. Throw away unused medicine. NOTE: This sheet is a summary. It may not cover all possible information. If you have questions about this medicine, talk to your doctor, pharmacist, or health care provider.  2023 [employer] Standard (2017-01-05 00:00:00)

## 2021-12-08 ENCOUNTER — Other Ambulatory Visit: Payer: Self-pay | Admitting: Oncology

## 2021-12-08 ENCOUNTER — Inpatient Hospital Stay: Payer: Medicare PPO

## 2021-12-08 VITALS — BP 157/73 | HR 72 | Temp 98.1°F | Resp 18 | Ht 64.0 in | Wt 139.0 lb

## 2021-12-08 DIAGNOSIS — D649 Anemia, unspecified: Secondary | ICD-10-CM | POA: Diagnosis not present

## 2021-12-08 DIAGNOSIS — D631 Anemia in chronic kidney disease: Secondary | ICD-10-CM

## 2021-12-08 DIAGNOSIS — N189 Chronic kidney disease, unspecified: Secondary | ICD-10-CM | POA: Diagnosis not present

## 2021-12-08 DIAGNOSIS — N184 Chronic kidney disease, stage 4 (severe): Secondary | ICD-10-CM | POA: Diagnosis not present

## 2021-12-08 DIAGNOSIS — Z79899 Other long term (current) drug therapy: Secondary | ICD-10-CM | POA: Diagnosis not present

## 2021-12-08 DIAGNOSIS — D509 Iron deficiency anemia, unspecified: Secondary | ICD-10-CM | POA: Diagnosis not present

## 2021-12-08 DIAGNOSIS — D460 Refractory anemia without ring sideroblasts, so stated: Secondary | ICD-10-CM | POA: Diagnosis not present

## 2021-12-08 DIAGNOSIS — D638 Anemia in other chronic diseases classified elsewhere: Secondary | ICD-10-CM | POA: Diagnosis not present

## 2021-12-08 LAB — CBC AND DIFFERENTIAL
HCT: 33 — AB (ref 36–46)
Hemoglobin: 10.1 — AB (ref 12.0–16.0)
Neutrophils Absolute: 3.22
Platelets: 289 10*3/uL (ref 150–400)
WBC: 6.2

## 2021-12-08 LAB — CBC: RBC: 3.7 — AB (ref 3.87–5.11)

## 2021-12-08 MED ORDER — FILGRASTIM-SNDZ 480 MCG/0.8ML IJ SOSY
480.0000 ug | PREFILLED_SYRINGE | Freq: Once | INTRAMUSCULAR | Status: AC
  Administered 2021-12-08: 480 ug via SUBCUTANEOUS
  Filled 2021-12-08: qty 0.8

## 2021-12-08 MED ORDER — EPOETIN ALFA-EPBX 40000 UNIT/ML IJ SOLN
40000.0000 [IU] | Freq: Once | INTRAMUSCULAR | Status: AC
  Administered 2021-12-08: 40000 [IU] via SUBCUTANEOUS
  Filled 2021-12-08: qty 1

## 2021-12-08 NOTE — Patient Instructions (Signed)
Filgrastim, G-CSF injection What is this medication? FILGRASTIM, G-CSF (fil GRA stim) is a granulocyte colony-stimulating factor that stimulates the growth of neutrophils, a type of white blood cell (WBC) important in the body's fight against infection. It is used to reduce the incidence of fever and infection in patients with certain types of cancer who are receiving chemotherapy that affects the bone marrow, to stimulate blood cell production for removal of WBCs from the body prior to a bone marrow transplantation, to reduce the incidence of fever and infection in patients who have severe chronic neutropenia, and to improve survival outcomes following high-dose radiation exposure that is toxic to the bone marrow. This medicine may be used for other purposes; ask your health care provider or pharmacist if you have questions. COMMON BRAND NAME(S): Neupogen, Nivestym, Releuko, Zarxio What should I tell my care team before I take this medication? They need to know if you have any of these conditions: kidney disease latex allergy ongoing radiation therapy sickle cell disease an unusual or allergic reaction to filgrastim, pegfilgrastim, other medicines, foods, dyes, or preservatives pregnant or trying to get pregnant breast-feeding How should I use this medication? This medicine is for injection under the skin or infusion into a vein. As an infusion into a vein, it is usually given by a health care professional in a hospital or clinic setting. If you get this medicine at home, you will be taught how to prepare and give this medicine. Refer to the Instructions for Use that come with your medication packaging. Use exactly as directed. Take your medicine at regular intervals. Do not take your medicine more often than directed. It is important that you put your used needles and syringes in a special sharps container. Do not put them in a trash can. If you do not have a sharps container, call your pharmacist  or healthcare provider to get one. Talk to your pediatrician regarding the use of this medicine in children. While this drug may be prescribed for children as young as 7 months for selected conditions, precautions do apply. Overdosage: If you think you have taken too much of this medicine contact a poison control center or emergency room at once. NOTE: This medicine is only for you. Do not share this medicine with others. What if I miss a dose? It is important not to miss your dose. Call your doctor or health care professional if you miss a dose. What may interact with this medication? This medicine may interact with the following medications: medicines that may cause a release of neutrophils, such as lithium This list may not describe all possible interactions. Give your health care provider a list of all the medicines, herbs, non-prescription drugs, or dietary supplements you use. Also tell them if you smoke, drink alcohol, or use illegal drugs. Some items may interact with your medicine. What should I watch for while using this medication? Your condition will be monitored carefully while you are receiving this medicine. You may need blood work done while you are taking this medicine. Talk to your health care provider about your risk of cancer. You may be more at risk for certain types of cancer if you take this medicine. What side effects may I notice from receiving this medication? Side effects that you should report to your doctor or health care professional as soon as possible: allergic reactions like skin rash, itching or hives, swelling of the face, lips, or tongue back pain dizziness or feeling faint fever pain, redness, or   irritation at site where injected pinpoint red spots on the skin shortness of breath or breathing problems signs and symptoms of kidney injury like trouble passing urine, change in the amount of urine, or red or dark-brown urine stomach or side pain, or pain at  the shoulder swelling tiredness unusual bleeding or bruising Side effects that usually do not require medical attention (report to your doctor or health care professional if they continue or are bothersome): bone pain cough diarrhea hair loss headache muscle pain This list may not describe all possible side effects. Call your doctor for medical advice about side effects. You may report side effects to FDA at 1-800-FDA-1088. Where should I keep my medication? Keep out of the reach of children. Store in a refrigerator between 2 and 8 degrees C (36 and 46 degrees F). Do not freeze. Keep in carton to protect from light. Throw away this medicine if vials or syringes are left out of the refrigerator for more than 24 hours. Throw away any unused medicine after the expiration date. NOTE: This sheet is a summary. It may not cover all possible information. If you have questions about this medicine, talk to your doctor, pharmacist, or health care provider.  2023 [employer] Standard (2021-04-04 00:00:00) Epoetin Alfa injection What is this medication? EPOETIN ALFA (e POE e tin AL fa) helps your body make more red blood cells. This medicine is used to treat anemia caused by chronic kidney disease, cancer chemotherapy, or HIV-therapy. It may also be used before surgery if you have anemia. This medicine may be used for other purposes; ask your health care provider or pharmacist if you have questions. COMMON BRAND NAME(S): Epogen, Procrit, Retacrit What should I tell my care team before I take this medication? They need to know if you have any of these conditions: cancer heart disease high blood pressure history of blood clots history of stroke low levels of folate, iron, or vitamin B12 in the blood seizures an unusual or allergic reaction to erythropoietin, albumin, benzyl alcohol, hamster proteins, other medicines, foods, dyes, or preservatives pregnant or trying to get  pregnant breast-feeding How should I use this medication? This medicine is for injection into a vein or under the skin. It is usually given by a health care professional in a hospital or clinic setting. If you get this medicine at home, you will be taught how to prepare and give this medicine. Use exactly as directed. Take your medicine at regular intervals. Do not take your medicine more often than directed. It is important that you put your used needles and syringes in a special sharps container. Do not put them in a trash can. If you do not have a sharps container, call your pharmacist or healthcare provider to get one. A special MedGuide will be given to you by the pharmacist with each prescription and refill. Be sure to read this information carefully each time. Talk to your pediatrician regarding the use of this medicine in children. While this drug may be prescribed for selected conditions, precautions do apply. Overdosage: If you think you have taken too much of this medicine contact a poison control center or emergency room at once. NOTE: This medicine is only for you. Do not share this medicine with others. What if I miss a dose? If you miss a dose, take it as soon as you can. If it is almost time for your next dose, take only that dose. Do not take double or extra doses. What may interact  with this medication? Interactions have not been studied. This list may not describe all possible interactions. Give your health care provider a list of all the medicines, herbs, non-prescription drugs, or dietary supplements you use. Also tell them if you smoke, drink alcohol, or use illegal drugs. Some items may interact with your medicine. What should I watch for while using this medication? Your condition will be monitored carefully while you are receiving this medicine. You may need blood work done while you are taking this medicine. This medicine may cause a decrease in vitamin B6. You should make  sure that you get enough vitamin B6 while you are taking this medicine. Discuss the foods you eat and the vitamins you take with your health care professional. What side effects may I notice from receiving this medication? Side effects that you should report to your doctor or health care professional as soon as possible: allergic reactions like skin rash, itching or hives, swelling of the face, lips, or tongue seizures signs and symptoms of a blood clot such as breathing problems; changes in vision; chest pain; severe, sudden headache; pain, swelling, warmth in the leg; trouble speaking; sudden numbness or weakness of the face, arm or leg signs and symptoms of a stroke like changes in vision; confusion; trouble speaking or understanding; severe headaches; sudden numbness or weakness of the face, arm or leg; trouble walking; dizziness; loss of balance or coordination Side effects that usually do not require medical attention (report to your doctor or health care professional if they continue or are bothersome): chills cough dizziness fever headaches joint pain muscle cramps muscle pain nausea, vomiting pain, redness, or irritation at site where injected This list may not describe all possible side effects. Call your doctor for medical advice about side effects. You may report side effects to FDA at 1-800-FDA-1088. Where should I keep my medication? Keep out of the reach of children. Store in a refrigerator between 2 and 8 degrees C (36 and 46 degrees F). Do not freeze or shake. Throw away any unused portion if using a single-dose vial. Multi-dose vials can be kept in the refrigerator for up to 21 days after the initial dose. Throw away unused medicine. NOTE: This sheet is a summary. It may not cover all possible information. If you have questions about this medicine, talk to your doctor, pharmacist, or health care provider.  2023 [employer] Standard (2017-01-05 00:00:00)

## 2021-12-10 ENCOUNTER — Other Ambulatory Visit: Payer: Self-pay | Admitting: Hematology and Oncology

## 2021-12-10 DIAGNOSIS — D631 Anemia in chronic kidney disease: Secondary | ICD-10-CM

## 2021-12-15 ENCOUNTER — Inpatient Hospital Stay: Payer: Medicare PPO

## 2021-12-15 VITALS — BP 129/62 | HR 78 | Temp 98.3°F | Resp 18 | Ht 64.0 in | Wt 139.0 lb

## 2021-12-15 DIAGNOSIS — D631 Anemia in chronic kidney disease: Secondary | ICD-10-CM

## 2021-12-15 DIAGNOSIS — D460 Refractory anemia without ring sideroblasts, so stated: Secondary | ICD-10-CM | POA: Diagnosis not present

## 2021-12-15 DIAGNOSIS — N184 Chronic kidney disease, stage 4 (severe): Secondary | ICD-10-CM | POA: Diagnosis not present

## 2021-12-15 DIAGNOSIS — Z79899 Other long term (current) drug therapy: Secondary | ICD-10-CM | POA: Diagnosis not present

## 2021-12-15 DIAGNOSIS — N189 Chronic kidney disease, unspecified: Secondary | ICD-10-CM | POA: Diagnosis not present

## 2021-12-15 LAB — BASIC METABOLIC PANEL
BUN: 60 — AB (ref 4–21)
CO2: 21 (ref 13–22)
Chloride: 104 (ref 99–108)
Creatinine: 2.5 — AB (ref 0.5–1.1)
Glucose: 153
Potassium: 3.9 mEq/L (ref 3.5–5.1)
Sodium: 135 — AB (ref 137–147)

## 2021-12-15 LAB — CBC AND DIFFERENTIAL
HCT: 31 — AB (ref 36–46)
Hemoglobin: 9.5 — AB (ref 12.0–16.0)
Neutrophils Absolute: 3.86
Platelets: 293 10*3/uL (ref 150–400)
WBC: 6.9

## 2021-12-15 LAB — CBC: RBC: 3.49 — AB (ref 3.87–5.11)

## 2021-12-15 LAB — COMPREHENSIVE METABOLIC PANEL
Albumin: 3.4 — AB (ref 3.5–5.0)
Calcium: 8.3 — AB (ref 8.7–10.7)

## 2021-12-15 LAB — HEPATIC FUNCTION PANEL
ALT: 14 U/L (ref 7–35)
AST: 26 (ref 13–35)
Alkaline Phosphatase: 95 (ref 25–125)
Bilirubin, Total: 0.4

## 2021-12-15 MED ORDER — FILGRASTIM-SNDZ 480 MCG/0.8ML IJ SOSY
480.0000 ug | PREFILLED_SYRINGE | Freq: Once | INTRAMUSCULAR | Status: AC
  Administered 2021-12-15: 480 ug via SUBCUTANEOUS
  Filled 2021-12-15: qty 0.8

## 2021-12-15 MED ORDER — EPOETIN ALFA-EPBX 40000 UNIT/ML IJ SOLN
40000.0000 [IU] | Freq: Once | INTRAMUSCULAR | Status: AC
  Administered 2021-12-15: 40000 [IU] via SUBCUTANEOUS
  Filled 2021-12-15: qty 1

## 2021-12-22 ENCOUNTER — Inpatient Hospital Stay: Payer: Medicare PPO

## 2021-12-22 ENCOUNTER — Other Ambulatory Visit: Payer: Self-pay | Admitting: Hematology and Oncology

## 2021-12-22 ENCOUNTER — Inpatient Hospital Stay: Payer: Medicare PPO | Attending: Oncology

## 2021-12-22 VITALS — BP 132/63 | HR 64 | Temp 97.9°F | Resp 18 | Ht 64.0 in | Wt 136.8 lb

## 2021-12-22 DIAGNOSIS — D631 Anemia in chronic kidney disease: Secondary | ICD-10-CM | POA: Diagnosis not present

## 2021-12-22 DIAGNOSIS — Z79899 Other long term (current) drug therapy: Secondary | ICD-10-CM | POA: Diagnosis not present

## 2021-12-22 DIAGNOSIS — N184 Chronic kidney disease, stage 4 (severe): Secondary | ICD-10-CM | POA: Diagnosis present

## 2021-12-22 DIAGNOSIS — D469 Myelodysplastic syndrome, unspecified: Secondary | ICD-10-CM | POA: Insufficient documentation

## 2021-12-22 DIAGNOSIS — D460 Refractory anemia without ring sideroblasts, so stated: Secondary | ICD-10-CM | POA: Diagnosis present

## 2021-12-22 DIAGNOSIS — N189 Chronic kidney disease, unspecified: Secondary | ICD-10-CM | POA: Insufficient documentation

## 2021-12-22 DIAGNOSIS — D509 Iron deficiency anemia, unspecified: Secondary | ICD-10-CM | POA: Insufficient documentation

## 2021-12-22 DIAGNOSIS — D649 Anemia, unspecified: Secondary | ICD-10-CM | POA: Diagnosis not present

## 2021-12-22 DIAGNOSIS — D638 Anemia in other chronic diseases classified elsewhere: Secondary | ICD-10-CM | POA: Diagnosis not present

## 2021-12-22 LAB — BASIC METABOLIC PANEL
BUN: 71 — AB (ref 4–21)
CO2: 23 — AB (ref 13–22)
Chloride: 104 (ref 99–108)
Creatinine: 2.4 — AB (ref 0.5–1.1)
Glucose: 93
Potassium: 4.7 mEq/L (ref 3.5–5.1)
Sodium: 134 — AB (ref 137–147)

## 2021-12-22 LAB — COMPREHENSIVE METABOLIC PANEL
Albumin: 3.5 (ref 3.5–5.0)
Calcium: 8.6 — AB (ref 8.7–10.7)

## 2021-12-22 LAB — HEPATIC FUNCTION PANEL
ALT: 13 U/L (ref 7–35)
AST: 27 (ref 13–35)
Alkaline Phosphatase: 101 (ref 25–125)
Bilirubin, Total: 0.4

## 2021-12-22 LAB — CBC AND DIFFERENTIAL
HCT: 28 — AB (ref 36–46)
Hemoglobin: 8.6 — AB (ref 12.0–16.0)
Neutrophils Absolute: 4.28
Platelets: 294 10*3/uL (ref 150–400)
WBC: 6.8

## 2021-12-22 LAB — CBC: RBC: 3.34 — AB (ref 3.87–5.11)

## 2021-12-22 MED ORDER — EPOETIN ALFA-EPBX 40000 UNIT/ML IJ SOLN
40000.0000 [IU] | Freq: Once | INTRAMUSCULAR | Status: AC
  Administered 2021-12-22: 40000 [IU] via SUBCUTANEOUS
  Filled 2021-12-22: qty 1

## 2021-12-22 MED ORDER — FILGRASTIM-SNDZ 480 MCG/0.8ML IJ SOSY
480.0000 ug | PREFILLED_SYRINGE | Freq: Once | INTRAMUSCULAR | Status: AC
  Administered 2021-12-22: 480 ug via SUBCUTANEOUS
  Filled 2021-12-22: qty 0.8

## 2021-12-22 NOTE — Patient Instructions (Signed)
Filgrastim Injection What is this medication? FILGRASTIM (fil GRA stim) lowers the risk of infection in people who are receiving chemotherapy. It works by helping your body make more white blood cells, which protects your body from infection. It may also be used to help people who have been exposed to high doses of radiation. It can be used to help prepare your body before a stem cell transplant. It works by helping your bone marrow make and release stem cells into the blood. This medicine may be used for other purposes; ask your health care provider or pharmacist if you have questions. COMMON BRAND NAME(S): Neupogen, Nivestym, Releuko, Zarxio What should I tell my care team before I take this medication? They need to know if you have any of these conditions: History of blood diseases, such as sickle cell anemia Kidney disease Recent or ongoing radiation An unusual or allergic reaction to filgrastim, pegfilgrastim, latex, rubber, other medications, foods, dyes, or preservatives Pregnant or trying to get pregnant Breast-feeding How should I use this medication? This medication is injected under the skin or into a vein. It is usually given by your care team in a hospital or clinic setting. It may be given at home. If you get this medication at home, you will be taught how to prepare and give it. Use exactly as directed. Take it as directed on the prescription label at the same time every day. Keep taking it unless your care team tells you to stop. It is important that you put your used needles and syringes in a special sharps container. Do not put them in a trash can. If you do not have a sharps container, call your pharmacist or care team to get one. This medication comes with INSTRUCTIONS FOR USE. Ask your pharmacist for directions on how to use this medication. Read the information carefully. Talk to your pharmacist or care team if you have questions. Talk to your care team about the use of this  medication in children. While it may be prescribed for children for selected conditions, precautions do apply. Overdosage: If you think you have taken too much of this medicine contact a poison control center or emergency room at once. NOTE: This medicine is only for you. Do not share this medicine with others. What if I miss a dose? It is important not to miss any doses. Talk to your care team about what to do if you miss a dose. What may interact with this medication? Medications that may cause a release of neutrophils, such as lithium This list may not describe all possible interactions. Give your health care provider a list of all the medicines, herbs, non-prescription drugs, or dietary supplements you use. Also tell them if you smoke, drink alcohol, or use illegal drugs. Some items may interact with your medicine. What should I watch for while using this medication? Your condition will be monitored carefully while you are receiving this medication. You may need bloodwork while taking this medication. Talk to your care team about your risk of cancer. You may be more at risk for certain types of cancer if you take this medication. What side effects may I notice from receiving this medication? Side effects that you should report to your care team as soon as possible: Allergic reactions--skin rash, itching, hives, swelling of the face, lips, tongue, or throat Capillary leak syndrome--stomach or muscle pain, unusual weakness or fatigue, feeling faint or lightheaded, decrease in the amount of urine, swelling of the ankles, hands, or   feet, trouble breathing High white blood cell level--fever, fatigue, trouble breathing, night sweats, change in vision, weight loss Inflammation of the aorta--fever, fatigue, back, chest, or stomach pain, severe headache Kidney injury (glomerulonephritis)--decrease in the amount of urine, red or dark brown urine, foamy or bubbly urine, swelling of the ankles, hands, or  feet Shortness of breath or trouble breathing Spleen injury--pain in upper left stomach or shoulder Unusual bruising or bleeding Side effects that usually do not require medical attention (report to your care team if they continue or are bothersome): Back pain Bone pain Fatigue Fever Headache Nausea This list may not describe all possible side effects. Call your doctor for medical advice about side effects. You may report side effects to FDA at 1-800-FDA-1088. Where should I keep my medication? Keep out of the reach of children and pets. Keep this medication in the original packaging until you are ready to take it. Protect from light. See product for storage information. Each product may have different instructions. Get rid of any unused medication after the expiration date. To get rid of medications that are no longer needed or have expired: Take the medication to a medications take-back program. Check with your pharmacy or law enforcement to find a location. If you cannot return the medication, ask your pharmacist or care team how to get rid of this medication safely. NOTE: This sheet is a summary. It may not cover all possible information. If you have questions about this medicine, talk to your doctor, pharmacist, or health care provider.  2023 [employer] Standard (2021-08-12 00:00:00) Epoetin Alfa Injection What is this medication? EPOETIN ALFA (e POE e tin AL fa) treats low levels of red blood cells (anemia) caused by kidney disease, chemotherapy, or HIV medications. It can also be used in people who are at risk for blood loss during surgery. It works by Building control surveyor make more red blood cells, which reduces the need for blood transfusions. This medicine may be used for other purposes; ask your health care provider or pharmacist if you have questions. COMMON BRAND NAME(S): Epogen, Procrit, Retacrit What should I tell my care team before I take this medication? They need to know  if you have any of these conditions: Blood clots Cancer Heart disease High blood pressure On dialysis Seizures Stroke An unusual or allergic reaction to epoetin alfa, albumin, benzyl alcohol, other medications, foods, dyes, or preservatives Pregnant or trying to get pregnant Breast-feeding How should I use this medication? This medication is injected into a vein or under the skin. It is usually given by your care team in a hospital or clinic setting. It may also be given at home. If you get this medication at home, you will be taught how to prepare and give it. Use exactly as directed. Take it as directed on the prescription label at the same time every day. Keep taking it unless your care team tells you to stop. It is important that you put your used needles and syringes in a special sharps container. Do not put them in a trash can. If you do not have a sharps container, call your pharmacist or care team to get one. A special MedGuide will be given to you by the pharmacist with each prescription and refill. Be sure to read this information carefully each time. Talk to your care team about the use of this medication in children. While this medication may be used in children as young as 40 month of age for selected conditions, precautions  do apply. Overdosage: If you think you have taken too much of this medicine contact a poison control center or emergency room at once. NOTE: This medicine is only for you. Do not share this medicine with others. What if I miss a dose? If you miss a dose, take it as soon as you can. If it is almost time for your next dose, take only that dose. Do not take double or extra doses. What may interact with this medication? Darbepoetin alfa Methoxy polyethylene glycol-epoetin beta This list may not describe all possible interactions. Give your health care provider a list of all the medicines, herbs, non-prescription drugs, or dietary supplements you use. Also tell  them if you smoke, drink alcohol, or use illegal drugs. Some items may interact with your medicine. What should I watch for while using this medication? Visit your care team for regular checks on your progress. Check your blood pressure as directed. Know what your blood pressure should be and when to contact your care team. Your condition will be monitored carefully while you are receiving this medication. You may need blood work while taking this medication. What side effects may I notice from receiving this medication? Side effects that you should report to your care team as soon as possible: Allergic reactions--skin rash, itching, hives, swelling of the face, lips, tongue, or throat Blood clot--pain, swelling, or warmth in the leg, shortness of breath, chest pain Heart attack--pain or tightness in the chest, shoulders, arms, or jaw, nausea, shortness of breath, cold or clammy skin, feeling faint or lightheaded Increase in blood pressure Rash, fever, and swollen lymph nodes Redness, blistering, peeling, or loosening of the skin, including inside the mouth Seizures Stroke--sudden numbness or weakness of the face, arm, or leg, trouble speaking, confusion, trouble walking, loss of balance or coordination, dizziness, severe headache, change in vision Side effects that usually do not require medical attention (report to your care team if they continue or are bothersome): Bone, joint, or muscle pain Cough Headache Nausea Pain, redness, or irritation at injection site This list may not describe all possible side effects. Call your doctor for medical advice about side effects. You may report side effects to FDA at 1-800-FDA-1088. Where should I keep my medication? Keep out of the reach of children and pets. Store in a refrigerator. Do not freeze. Do not shake. Protect from light. Keep this medication in the original container until you are ready to take it. See product for storage information. Get  rid of any unused medication after the expiration date. To get rid of medications that are no longer needed or have expired: Take the medication to a medication take-back program. Check with your pharmacy or law enforcement to find a location. If you cannot return the medication, ask your pharmacist or care team how to get rid of the medication safely. NOTE: This sheet is a summary. It may not cover all possible information. If you have questions about this medicine, talk to your doctor, pharmacist, or health care provider.  2023 [employer] Standard (2021-08-14 00:00:00)

## 2021-12-26 ENCOUNTER — Other Ambulatory Visit: Payer: Self-pay | Admitting: Hematology and Oncology

## 2021-12-26 DIAGNOSIS — D631 Anemia in chronic kidney disease: Secondary | ICD-10-CM

## 2021-12-29 ENCOUNTER — Inpatient Hospital Stay: Payer: Medicare PPO

## 2021-12-29 VITALS — BP 137/65 | HR 74 | Temp 97.7°F | Resp 18 | Ht 64.0 in | Wt 138.2 lb

## 2021-12-29 DIAGNOSIS — Z79899 Other long term (current) drug therapy: Secondary | ICD-10-CM | POA: Diagnosis not present

## 2021-12-29 DIAGNOSIS — D509 Iron deficiency anemia, unspecified: Secondary | ICD-10-CM | POA: Diagnosis not present

## 2021-12-29 DIAGNOSIS — N184 Chronic kidney disease, stage 4 (severe): Secondary | ICD-10-CM

## 2021-12-29 DIAGNOSIS — D469 Myelodysplastic syndrome, unspecified: Secondary | ICD-10-CM | POA: Diagnosis not present

## 2021-12-29 DIAGNOSIS — D631 Anemia in chronic kidney disease: Secondary | ICD-10-CM | POA: Diagnosis not present

## 2021-12-29 DIAGNOSIS — N189 Chronic kidney disease, unspecified: Secondary | ICD-10-CM | POA: Diagnosis not present

## 2021-12-29 LAB — CBC AND DIFFERENTIAL
HCT: 28 — AB (ref 36–46)
Hemoglobin: 8.6 — AB (ref 12.0–16.0)
Neutrophils Absolute: 4.85
Platelets: 321 10*3/uL (ref 150–400)
WBC: 7.7

## 2021-12-29 LAB — CBC: RBC: 3.46 — AB (ref 3.87–5.11)

## 2021-12-29 MED ORDER — EPOETIN ALFA-EPBX 40000 UNIT/ML IJ SOLN
40000.0000 [IU] | Freq: Once | INTRAMUSCULAR | Status: AC
  Administered 2021-12-29: 40000 [IU] via SUBCUTANEOUS
  Filled 2021-12-29: qty 1

## 2021-12-29 MED ORDER — FILGRASTIM-SNDZ 480 MCG/0.8ML IJ SOSY
480.0000 ug | PREFILLED_SYRINGE | Freq: Once | INTRAMUSCULAR | Status: AC
  Administered 2021-12-29: 480 ug via SUBCUTANEOUS
  Filled 2021-12-29: qty 0.8

## 2021-12-29 NOTE — Patient Instructions (Signed)
Epoetin Alfa Injection What is this medication? EPOETIN ALFA (e POE e tin AL fa) treats low levels of red blood cells (anemia) caused by kidney disease, chemotherapy, or HIV medications. It can also be used in people who are at risk for blood loss during surgery. It works by helping your body make more red blood cells, which reduces the need for blood transfusions. This medicine may be used for other purposes; ask your health care provider or pharmacist if you have questions. COMMON BRAND NAME(S): Epogen, Procrit, Retacrit What should I tell my care team before I take this medication? They need to know if you have any of these conditions: Blood clots Cancer Heart disease High blood pressure On dialysis Seizures Stroke An unusual or allergic reaction to epoetin alfa, albumin, benzyl alcohol, other medications, foods, dyes, or preservatives Pregnant or trying to get pregnant Breast-feeding How should I use this medication? This medication is injected into a vein or under the skin. It is usually given by your care team in a hospital or clinic setting. It may also be given at home. If you get this medication at home, you will be taught how to prepare and give it. Use exactly as directed. Take it as directed on the prescription label at the same time every day. Keep taking it unless your care team tells you to stop. It is important that you put your used needles and syringes in a special sharps container. Do not put them in a trash can. If you do not have a sharps container, call your pharmacist or care team to get one. A special MedGuide will be given to you by the pharmacist with each prescription and refill. Be sure to read this information carefully each time. Talk to your care team about the use of this medication in children. While this medication may be used in children as young as 1 month of age for selected conditions, precautions do apply. Overdosage: If you think you have taken too much  of this medicine contact a poison control center or emergency room at once. NOTE: This medicine is only for you. Do not share this medicine with others. What if I miss a dose? If you miss a dose, take it as soon as you can. If it is almost time for your next dose, take only that dose. Do not take double or extra doses. What may interact with this medication? Darbepoetin alfa Methoxy polyethylene glycol-epoetin beta This list may not describe all possible interactions. Give your health care provider a list of all the medicines, herbs, non-prescription drugs, or dietary supplements you use. Also tell them if you smoke, drink alcohol, or use illegal drugs. Some items may interact with your medicine. What should I watch for while using this medication? Visit your care team for regular checks on your progress. Check your blood pressure as directed. Know what your blood pressure should be and when to contact your care team. Your condition will be monitored carefully while you are receiving this medication. You may need blood work while taking this medication. What side effects may I notice from receiving this medication? Side effects that you should report to your care team as soon as possible: Allergic reactions--skin rash, itching, hives, swelling of the face, lips, tongue, or throat Blood clot--pain, swelling, or warmth in the leg, shortness of breath, chest pain Heart attack--pain or tightness in the chest, shoulders, arms, or jaw, nausea, shortness of breath, cold or clammy skin, feeling faint or lightheaded Increase   in blood pressure Rash, fever, and swollen lymph nodes Redness, blistering, peeling, or loosening of the skin, including inside the mouth Seizures Stroke--sudden numbness or weakness of the face, arm, or leg, trouble speaking, confusion, trouble walking, loss of balance or coordination, dizziness, severe headache, change in vision Side effects that usually do not require medical  attention (report to your care team if they continue or are bothersome): Bone, joint, or muscle pain Cough Headache Nausea Pain, redness, or irritation at injection site This list may not describe all possible side effects. Call your doctor for medical advice about side effects. You may report side effects to FDA at 1-800-FDA-1088. Where should I keep my medication? Keep out of the reach of children and pets. Store in a refrigerator. Do not freeze. Do not shake. Protect from light. Keep this medication in the original container until you are ready to take it. See product for storage information. Get rid of any unused medication after the expiration date. To get rid of medications that are no longer needed or have expired: Take the medication to a medication take-back program. Check with your pharmacy or law enforcement to find a location. If you cannot return the medication, ask your pharmacist or care team how to get rid of the medication safely. NOTE: This sheet is a summary. It may not cover all possible information. If you have questions about this medicine, talk to your doctor, pharmacist, or health care provider.  2023 Elsevier/Gold Standard (2021-08-14 00:00:00) Filgrastim Injection What is this medication? FILGRASTIM (fil GRA stim) lowers the risk of infection in people who are receiving chemotherapy. It works by Building control surveyor make more white blood cells, which protects your body from infection. It may also be used to help people who have been exposed to high doses of radiation. It can be used to help prepare your body before a stem cell transplant. It works by helping your bone marrow make and release stem cells into the blood. This medicine may be used for other purposes; ask your health care provider or pharmacist if you have questions. COMMON BRAND NAME(S): Neupogen, Nivestym, Releuko, Zarxio What should I tell my care team before I take this medication? They need to know if you  have any of these conditions: History of blood diseases, such as sickle cell anemia Kidney disease Recent or ongoing radiation An unusual or allergic reaction to filgrastim, pegfilgrastim, latex, rubber, other medications, foods, dyes, or preservatives Pregnant or trying to get pregnant Breast-feeding How should I use this medication? This medication is injected under the skin or into a vein. It is usually given by your care team in a hospital or clinic setting. It may be given at home. If you get this medication at home, you will be taught how to prepare and give it. Use exactly as directed. Take it as directed on the prescription label at the same time every day. Keep taking it unless your care team tells you to stop. It is important that you put your used needles and syringes in a special sharps container. Do not put them in a trash can. If you do not have a sharps container, call your pharmacist or care team to get one. This medication comes with INSTRUCTIONS FOR USE. Ask your pharmacist for directions on how to use this medication. Read the information carefully. Talk to your pharmacist or care team if you have questions. Talk to your care team about the use of this medication in children. While it may  be prescribed for children for selected conditions, precautions do apply. Overdosage: If you think you have taken too much of this medicine contact a poison control center or emergency room at once. NOTE: This medicine is only for you. Do not share this medicine with others. What if I miss a dose? It is important not to miss any doses. Talk to your care team about what to do if you miss a dose. What may interact with this medication? Medications that may cause a release of neutrophils, such as lithium This list may not describe all possible interactions. Give your health care provider a list of all the medicines, herbs, non-prescription drugs, or dietary supplements you use. Also tell them if  you smoke, drink alcohol, or use illegal drugs. Some items may interact with your medicine. What should I watch for while using this medication? Your condition will be monitored carefully while you are receiving this medication. You may need bloodwork while taking this medication. Talk to your care team about your risk of cancer. You may be more at risk for certain types of cancer if you take this medication. What side effects may I notice from receiving this medication? Side effects that you should report to your care team as soon as possible: Allergic reactions--skin rash, itching, hives, swelling of the face, lips, tongue, or throat Capillary leak syndrome--stomach or muscle pain, unusual weakness or fatigue, feeling faint or lightheaded, decrease in the amount of urine, swelling of the ankles, hands, or feet, trouble breathing High white blood cell level--fever, fatigue, trouble breathing, night sweats, change in vision, weight loss Inflammation of the aorta--fever, fatigue, back, chest, or stomach pain, severe headache Kidney injury (glomerulonephritis)--decrease in the amount of urine, red or dark brown urine, foamy or bubbly urine, swelling of the ankles, hands, or feet Shortness of breath or trouble breathing Spleen injury--pain in upper left stomach or shoulder Unusual bruising or bleeding Side effects that usually do not require medical attention (report to your care team if they continue or are bothersome): Back pain Bone pain Fatigue Fever Headache Nausea This list may not describe all possible side effects. Call your doctor for medical advice about side effects. You may report side effects to FDA at 1-800-FDA-1088. Where should I keep my medication? Keep out of the reach of children and pets. Keep this medication in the original packaging until you are ready to take it. Protect from light. See product for storage information. Each product may have different instructions. Get rid  of any unused medication after the expiration date. To get rid of medications that are no longer needed or have expired: Take the medication to a medications take-back program. Check with your pharmacy or law enforcement to find a location. If you cannot return the medication, ask your pharmacist or care team how to get rid of this medication safely. NOTE: This sheet is a summary. It may not cover all possible information. If you have questions about this medicine, talk to your doctor, pharmacist, or health care provider.  2023 Elsevier/Gold Standard (2021-08-12 00:00:00)

## 2022-01-02 ENCOUNTER — Other Ambulatory Visit: Payer: Self-pay | Admitting: Hematology and Oncology

## 2022-01-02 DIAGNOSIS — D631 Anemia in chronic kidney disease: Secondary | ICD-10-CM

## 2022-01-04 NOTE — Progress Notes (Signed)
[facility]  [address] [place],  [place]  [address] [phone]  Clinic Day:  01/05/2022  Referring physician: Nicoletta Dress, MD  HISTORY OF PRESENT ILLNESS:  The patient is an 88 y.o. female with anemia secondary to chronic renal insufficiency and myelodysplasia.  Iron parameters earlier this year also suggested a component of iron deficiency anemia to where has needed IV iron.  Due to having suboptimal hemoglobin levels, the patient recently had her Zarxio and Retacrit shots increased to once weekly with the hopes that the frequency will lead to improvement in her hemoglobin.  She comes in to reassess her hemoglobin today.  Overall, she claims to be doing okay.  She denies having any overt forms of blood loss or increased fatigue which concerns her for progressive anemia.  PHYSICAL EXAM:  Blood pressure 137/68, pulse 77, temperature 97.8 F (36.6 C), resp. rate 14, height '5\' 4"'$  (1.626 m), weight 140 lb 6.4 oz (63.7 kg), SpO2 98 %. Wt Readings from Last 3 Encounters:  01/05/22 140 lb 6.4 oz (63.7 kg)  12/29/21 138 lb 4 oz (62.7 kg)  12/22/21 136 lb 12 oz (62 kg)   Body mass index is 24.1 kg/m. Performance status (ECOG): 2 Physical Exam Constitutional:      Appearance: Normal appearance. She is not ill-appearing.  HENT:     Mouth/Throat:     Mouth: Mucous membranes are moist.     Pharynx: Oropharynx is clear. No oropharyngeal exudate or posterior oropharyngeal erythema.  Cardiovascular:     Rate and Rhythm: Normal rate and regular rhythm.     Heart sounds: No murmur heard.    No friction rub. No gallop.  Pulmonary:     Effort: Pulmonary effort is normal. No respiratory distress.     Breath sounds: Normal breath sounds. No wheezing, rhonchi or rales.  Abdominal:     General: Bowel sounds are normal. There is no distension.     Palpations: Abdomen is soft. There is no mass.     Tenderness: There is no abdominal tenderness.   Musculoskeletal:        General: No swelling.     Right lower leg: No swelling. No edema.     Left lower leg: No edema.  Lymphadenopathy:     Cervical: No cervical adenopathy.     Upper Body:     Right upper body: No supraclavicular or axillary adenopathy.     Left upper body: No supraclavicular or axillary adenopathy.     Lower Body: No right inguinal adenopathy. No left inguinal adenopathy.  Skin:    General: Skin is warm.     Coloration: Skin is not jaundiced.     Findings: No lesion or rash.  Neurological:     General: No focal deficit present.     Mental Status: She is alert and oriented to person, place, and time. Mental status is at baseline.  Psychiatric:        Mood and Affect: Mood normal.        Behavior: Behavior normal.        Thought Content: Thought content normal.    LABS:       Latest Ref Rng & Units 12/22/2021   12:00 AM 12/15/2021   12:00 AM 11/03/2021   12:00 AM  CMP  BUN 4 - 21 71     60     54      Creatinine 0.5 - 1.1 2.4  2.5     2.3      Sodium 137 - 147 134     135     138      Potassium 3.5 - 5.1 mEq/L 4.7     3.9     4.1      Chloride 99 - 108 104     104     104      CO2 13 - '22 23     21     22      '$ Calcium 8.7 - 10.7 8.6     8.3     7.9      Alkaline Phos 25 - 125 101     95     97      AST 13 - 35 '27     26     20      '$ ALT 7 - 35 U/L '13     14     14         '$ This result is from an external source.    Latest Reference Range & Units Most Recent 10/14/21 15:34  Iron 28 - 170 ug/dL 16 (L) 01/05/22 15:20 32  UIBC ug/dL 306 01/05/22 15:20 169  TIBC 250 - 450 ug/dL 322 01/05/22 15:20 201 (L)  Saturation Ratios 10.4 - 31.8 % 5 (L) 01/05/22 15:20 16  Ferritin 11 - 307 ng/mL 11 01/05/22 15:20 106  (L): Data is abnormally low  Assessment/Plan:  An 88 y.o. female with anemia secondary to both renal insufficiency, iron deficiency and myelodysplasia.  When evaluating her labs today, her hemoglobin has progressively dropped over the past month.   Her MCV has also dropped fairly precipitously in just over a month's time.  Her iron parameters have also fallen rather abruptly over the past few months.  Based upon this, I will arrange for her to receive IV iron to bolster her iron stores and improve her hemoglobin.  Her Zarxio and Retacrit shots will be held over these next few months, but could be restarted if her IV iron does not lead to an ideal hemoglobin level over time.  I will see her back in 2 months for repeat clinical assessment.  The patient understands all the plans discussed today and is in agreement with them.  Roxana Lai Macarthur Critchley, MD

## 2022-01-05 ENCOUNTER — Inpatient Hospital Stay: Payer: Medicare PPO

## 2022-01-05 ENCOUNTER — Other Ambulatory Visit: Payer: Self-pay | Admitting: Oncology

## 2022-01-05 ENCOUNTER — Ambulatory Visit: Payer: Medicare PPO | Admitting: Podiatry

## 2022-01-05 ENCOUNTER — Inpatient Hospital Stay (INDEPENDENT_AMBULATORY_CARE_PROVIDER_SITE_OTHER): Payer: Medicare PPO | Admitting: Oncology

## 2022-01-05 VITALS — BP 137/68 | HR 77 | Temp 97.8°F | Resp 14 | Ht 64.0 in | Wt 140.4 lb

## 2022-01-05 DIAGNOSIS — D508 Other iron deficiency anemias: Secondary | ICD-10-CM

## 2022-01-05 DIAGNOSIS — D509 Iron deficiency anemia, unspecified: Secondary | ICD-10-CM | POA: Diagnosis not present

## 2022-01-05 DIAGNOSIS — Z79899 Other long term (current) drug therapy: Secondary | ICD-10-CM | POA: Diagnosis not present

## 2022-01-05 DIAGNOSIS — N189 Chronic kidney disease, unspecified: Secondary | ICD-10-CM | POA: Diagnosis not present

## 2022-01-05 DIAGNOSIS — D638 Anemia in other chronic diseases classified elsewhere: Secondary | ICD-10-CM | POA: Diagnosis not present

## 2022-01-05 DIAGNOSIS — D469 Myelodysplastic syndrome, unspecified: Secondary | ICD-10-CM | POA: Diagnosis not present

## 2022-01-05 DIAGNOSIS — D631 Anemia in chronic kidney disease: Secondary | ICD-10-CM | POA: Diagnosis not present

## 2022-01-05 DIAGNOSIS — D649 Anemia, unspecified: Secondary | ICD-10-CM | POA: Diagnosis not present

## 2022-01-05 LAB — CBC AND DIFFERENTIAL
HCT: 27 — AB (ref 36–46)
Hemoglobin: 8.2 — AB (ref 12.0–16.0)
Neutrophils Absolute: 4.55
Platelets: 308 10*3/uL (ref 150–400)
WBC: 7

## 2022-01-05 LAB — IRON AND TIBC
Iron: 16 ug/dL — ABNORMAL LOW (ref 28–170)
Saturation Ratios: 5 % — ABNORMAL LOW (ref 10.4–31.8)
TIBC: 322 ug/dL (ref 250–450)
UIBC: 306 ug/dL

## 2022-01-05 LAB — FERRITIN: Ferritin: 11 ng/mL (ref 11–307)

## 2022-01-05 LAB — CBC: RBC: 3.39 — AB (ref 3.87–5.11)

## 2022-01-06 ENCOUNTER — Encounter: Payer: Self-pay | Admitting: Oncology

## 2022-01-06 ENCOUNTER — Telehealth: Payer: Self-pay

## 2022-01-06 MED FILL — Ferumoxytol Inj 510 MG/17ML (30 MG/ML) (Elemental Fe): INTRAVENOUS | Qty: 17 | Status: AC

## 2022-01-06 NOTE — Telephone Encounter (Signed)
Jeani Hawking notified of below and verbalized understanding. I told her scheduling would be calling them to set up appt.        Latest Reference Range & Units Most Recent 10/14/21 15:34  Iron 28 - 170 ug/dL 16 (L) 01/05/22 15:20 32  UIBC ug/dL 306 01/05/22 15:20 169  TIBC 250 - 450 ug/dL 322 01/05/22 15:20 201 (L)  Saturation Ratios 10.4 - 31.8 % 5 (L) 01/05/22 15:20 16  Ferritin 11 - 307 ng/mL 11 01/05/22 15:20 106  (L): Data is abnormally low   Assessment/Plan:  An 88 y.o. female with anemia secondary to both renal insufficiency, iron deficiency and myelodysplasia.  When evaluating her labs today, her hemoglobin has progressively dropped over the past month.  Her MCV has also dropped fairly precipitously in just over a month's time.  Her iron parameters have also fallen rather abruptly over the past few months.  Based upon this, I will arrange for her to receive IV iron to bolster her iron stores and improve her hemoglobin.  Her Zarxio and Retacrit shots will be held over these next few months, but could be restarted if her IV iron does not lead to an ideal hemoglobin level over time.  I will see her back in 2 months for repeat clinical assessment.  The patient understands all the plans discussed today and is in agreement with them.   [name], MD

## 2022-01-07 ENCOUNTER — Inpatient Hospital Stay: Payer: Medicare PPO

## 2022-01-07 VITALS — BP 143/67 | HR 73 | Temp 97.7°F | Resp 18 | Ht 64.0 in | Wt 141.0 lb

## 2022-01-07 DIAGNOSIS — D509 Iron deficiency anemia, unspecified: Secondary | ICD-10-CM | POA: Diagnosis not present

## 2022-01-07 DIAGNOSIS — D469 Myelodysplastic syndrome, unspecified: Secondary | ICD-10-CM | POA: Diagnosis not present

## 2022-01-07 DIAGNOSIS — Z79899 Other long term (current) drug therapy: Secondary | ICD-10-CM | POA: Diagnosis not present

## 2022-01-07 DIAGNOSIS — N189 Chronic kidney disease, unspecified: Secondary | ICD-10-CM | POA: Diagnosis not present

## 2022-01-07 DIAGNOSIS — D631 Anemia in chronic kidney disease: Secondary | ICD-10-CM | POA: Diagnosis not present

## 2022-01-07 MED ORDER — SODIUM CHLORIDE 0.9 % IV SOLN
510.0000 mg | Freq: Once | INTRAVENOUS | Status: AC
  Administered 2022-01-07: 510 mg via INTRAVENOUS
  Filled 2022-01-07: qty 510

## 2022-01-07 MED ORDER — SODIUM CHLORIDE 0.9 % IV SOLN: Freq: Once | INTRAVENOUS | Status: AC

## 2022-01-07 NOTE — Patient Instructions (Signed)

## 2022-01-12 ENCOUNTER — Ambulatory Visit (INDEPENDENT_AMBULATORY_CARE_PROVIDER_SITE_OTHER): Payer: Medicare PPO | Admitting: Podiatry

## 2022-01-12 ENCOUNTER — Encounter: Payer: Self-pay | Admitting: Podiatry

## 2022-01-12 ENCOUNTER — Other Ambulatory Visit: Payer: Medicare PPO

## 2022-01-12 ENCOUNTER — Ambulatory Visit: Payer: Medicare PPO

## 2022-01-12 DIAGNOSIS — E039 Hypothyroidism, unspecified: Secondary | ICD-10-CM | POA: Diagnosis not present

## 2022-01-12 DIAGNOSIS — L97521 Non-pressure chronic ulcer of other part of left foot limited to breakdown of skin: Secondary | ICD-10-CM

## 2022-01-12 DIAGNOSIS — E1140 Type 2 diabetes mellitus with diabetic neuropathy, unspecified: Secondary | ICD-10-CM | POA: Diagnosis not present

## 2022-01-12 DIAGNOSIS — D638 Anemia in other chronic diseases classified elsewhere: Secondary | ICD-10-CM | POA: Diagnosis not present

## 2022-01-12 DIAGNOSIS — B351 Tinea unguium: Secondary | ICD-10-CM

## 2022-01-12 DIAGNOSIS — I110 Hypertensive heart disease with heart failure: Secondary | ICD-10-CM | POA: Diagnosis not present

## 2022-01-12 DIAGNOSIS — E1142 Type 2 diabetes mellitus with diabetic polyneuropathy: Secondary | ICD-10-CM | POA: Diagnosis not present

## 2022-01-12 DIAGNOSIS — N184 Chronic kidney disease, stage 4 (severe): Secondary | ICD-10-CM | POA: Diagnosis not present

## 2022-01-12 DIAGNOSIS — L97511 Non-pressure chronic ulcer of other part of right foot limited to breakdown of skin: Secondary | ICD-10-CM | POA: Diagnosis not present

## 2022-01-12 DIAGNOSIS — M79675 Pain in left toe(s): Secondary | ICD-10-CM

## 2022-01-12 DIAGNOSIS — M79674 Pain in right toe(s): Secondary | ICD-10-CM

## 2022-01-12 DIAGNOSIS — E785 Hyperlipidemia, unspecified: Secondary | ICD-10-CM | POA: Diagnosis not present

## 2022-01-12 DIAGNOSIS — I5040 Unspecified combined systolic (congestive) and diastolic (congestive) heart failure: Secondary | ICD-10-CM | POA: Diagnosis not present

## 2022-01-12 MED FILL — Ferumoxytol Inj 510 MG/17ML (30 MG/ML) (Elemental Fe): INTRAVENOUS | Qty: 17 | Status: AC

## 2022-01-12 NOTE — Progress Notes (Signed)
Subjective: Maureen Stout is a 88 y.o. female patient with history of diabetes who returns to office today for diabetic nail and wound care chronic.  Patient reports that she has nursing coming to the house to help with changing the dressings and states that they are doing a very good job.  Patient denies any changes like increasing redness warmth swelling or drainage or any other acute symptoms at this time.  Objective: General: Patient is awake, alert, and oriented x 3 and in no acute distress.  Integument: Skin is warm, dry and supple bilateral. Nails are elongated thickened and dystrophic with subungual debris, consistent with onychomycosis, 1-5 on right 1,2,4,5 on left.  Callus with pinpoint bleeding noted to the left plantar forefoot submet 1 and upon debridement measures 0.8 x 0.9 cm with a granular base, no periwound erythema no edema no warmth no malodor and left second toe, partial thickness wound measures 0.1x 0.1 x 0.1 with a granular base and no surrounding signs of infection, and submet 1 on right that measures 0.3x 0.3x0.2 cm, smaller han previous, all with a granular base, no significant redness, no significant warmth, no malodor.  No acute signs of infection.    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 Asymptomatic bunion and hammertoe pedal deformities noted bilateral. Muscular strength 5/5 in all lower extremity muscular groups bilateral without pain on range of motion. No tenderness with calf compression bilateral.  Assessment and plan Problem List Items Addressed This Visit   None Visit Diagnoses     Pain due to onychomycosis of toenails of both feet    -  Primary   Foot ulcer, left, limited to breakdown of skin ([facility])       Foot ulcer, limited to breakdown of skin, right ([facility])       Type 2 diabetes, controlled, with neuropathy ([facility])            -Examined patient. -Re-Discussed and educated patient on diabetic foot care,  especially with regards to the vascular, neurological and musculoskeletal systems.  -Mechanically debrided all nails x9 using sterile nail nipper without incident and smooth with rotary bur  -For these chronic ulcers, mechanically debrided plantar ulcer left second toe and left and right forefoot submet 1 using a sterile chisel blade without incident and applied Medihoney and padded gauze dressing and advised patient to refrain from walking barefoot -Continue with local and home wound care with [facility] home [facility] as previously ordered with  wound care instructions as above -Return to office in 10-12 weeks for nail care and wound check or sooner if problems or issues arise -Patient advised to call the office if any problems or questions arise in the meantime.  Lorenda Peck DPM

## 2022-01-13 ENCOUNTER — Inpatient Hospital Stay: Payer: Medicare PPO

## 2022-01-13 VITALS — BP 141/74 | HR 71 | Temp 98.0°F | Resp 18 | Ht 64.0 in | Wt 141.0 lb

## 2022-01-13 DIAGNOSIS — Z79899 Other long term (current) drug therapy: Secondary | ICD-10-CM | POA: Diagnosis not present

## 2022-01-13 DIAGNOSIS — D509 Iron deficiency anemia, unspecified: Secondary | ICD-10-CM | POA: Diagnosis not present

## 2022-01-13 DIAGNOSIS — N189 Chronic kidney disease, unspecified: Secondary | ICD-10-CM | POA: Diagnosis not present

## 2022-01-13 DIAGNOSIS — D631 Anemia in chronic kidney disease: Secondary | ICD-10-CM | POA: Diagnosis not present

## 2022-01-13 DIAGNOSIS — D469 Myelodysplastic syndrome, unspecified: Secondary | ICD-10-CM | POA: Diagnosis not present

## 2022-01-13 MED ORDER — SODIUM CHLORIDE 0.9 % IV SOLN: Freq: Once | INTRAVENOUS | Status: AC

## 2022-01-13 MED ORDER — SODIUM CHLORIDE 0.9 % IV SOLN
510.0000 mg | Freq: Once | INTRAVENOUS | Status: AC
  Administered 2022-01-13: 510 mg via INTRAVENOUS
  Filled 2022-01-13: qty 510

## 2022-01-13 NOTE — Patient Instructions (Signed)

## 2022-02-04 ENCOUNTER — Other Ambulatory Visit: Payer: Self-pay | Admitting: Cardiology

## 2022-02-04 NOTE — Telephone Encounter (Signed)
Rx refill sent to pharmacy. 

## 2022-02-17 DIAGNOSIS — E785 Hyperlipidemia, unspecified: Secondary | ICD-10-CM | POA: Diagnosis not present

## 2022-02-17 DIAGNOSIS — Z0000 Encounter for general adult medical examination without abnormal findings: Secondary | ICD-10-CM | POA: Diagnosis not present

## 2022-02-17 DIAGNOSIS — Z1331 Encounter for screening for depression: Secondary | ICD-10-CM | POA: Diagnosis not present

## 2022-03-05 NOTE — Progress Notes (Signed)
[facility]  [address] [place] Stout,  [place]  [address] [phone]  Clinic Day:  03/06/2022  Referring physician: Nicoletta Dress, MD  HISTORY OF PRESENT ILLNESS:  The patient is an 88 y.o. female with anemia secondary to chronic renal insufficiency and myelodysplasia.  Recent iron parameters also suggested iron deficiency anemia to where she recently received IV iron.  She comes in today to reassess her hemoglobin.  Overall, she claims to feel weak.  She really could not tell an improvement in how she felt since she received her IV iron.    PHYSICAL EXAM:  Blood pressure (!) 166/70, pulse 79, temperature (!) 97.4 F (36.3 C), resp. rate 14, height '5\' 4"'$  (1.626 m), weight 138 lb 12.8 oz (63 kg), SpO2 96 %. Wt Readings from Last 3 Encounters:  03/12/22 136 lb (61.7 kg)  03/06/22 138 lb 12.8 oz (63 kg)  01/13/22 141 lb (64 kg)   Body mass index is 23.82 kg/m. Performance status (ECOG): 2 Physical Exam Constitutional:      Appearance: Normal appearance. She is not ill-appearing.  HENT:     Mouth/Throat:     Mouth: Mucous membranes are moist.     Pharynx: Oropharynx is clear. No oropharyngeal exudate or posterior oropharyngeal erythema.  Cardiovascular:     Rate and Rhythm: Normal rate and regular rhythm.     Heart sounds: No murmur heard.    No friction rub. No gallop.  Pulmonary:     Effort: Pulmonary effort is normal. No respiratory distress.     Breath sounds: Normal breath sounds. No wheezing, rhonchi or rales.  Abdominal:     General: Bowel sounds are normal. There is no distension.     Palpations: Abdomen is soft. There is no mass.     Tenderness: There is no abdominal tenderness.  Musculoskeletal:        General: No swelling.     Right lower leg: No swelling. No edema.     Left lower leg: No edema.  Lymphadenopathy:     Cervical: No cervical adenopathy.     Upper Body:     Right upper body: No supraclavicular or axillary  adenopathy.     Left upper body: No supraclavicular or axillary adenopathy.     Lower Body: No right inguinal adenopathy. No left inguinal adenopathy.  Skin:    General: Skin is warm.     Coloration: Skin is not jaundiced.     Findings: No lesion or rash.  Neurological:     General: No focal deficit present.     Mental Status: She is alert and oriented to person, place, and time. Mental status is at baseline.  Psychiatric:        Mood and Affect: Mood normal.        Behavior: Behavior normal.        Thought Content: Thought content normal.   LABS:    Latest Reference Range & Units 03/06/22 00:00  WBC  7.5 (E)  RBC 3.87 - 5.11  2.56 ! (E)  Hemoglobin 12.0 - 16.0  7.6 ! (E)  HCT 36 - 46  24 ! (E)  Platelets 150 - 400 K/uL 279 (E)  !: Data is abnormal (E): External lab result    Latest Ref Rng & Units 03/06/2022   12:00 AM 12/22/2021   12:00 AM 12/15/2021   12:00 AM  CMP  BUN 4 - 21 57     71  60      Creatinine 0.5 - 1.1 2.7     2.4     2.5      Sodium 137 - 147 136     134     135      Potassium 3.5 - 5.1 mEq/L 4.7     4.7     3.9      Chloride 99 - 108 103     104     104      CO2 13 - '22 24     23     21      '$ Calcium 8.7 - 10.7 8.8     8.6     8.3      Alkaline Phos 25 - 125 99     101     95      AST 13 - 35 '29     27     26      '$ ALT 7 - 35 U/L '19     13     14         '$ This result is from an external source.    Latest Reference Range & Units 03/06/22 13:24  Iron 28 - 170 ug/dL 39  UIBC ug/dL 275  TIBC 250 - 450 ug/dL 314  Saturation Ratios 10.4 - 31.8 % 12  Ferritin 11 - 307 ng/mL 34   Assessment/Plan:  An 88 y.o. female with anemia secondary to renal insufficiency, iron deficiency and myelodysplasia.  When evaluating her labs today, her hemoglobin did not significantly improve after receiving her IV iron.  Based upon this and the fact that red cell shot therapy did not do much to improve her hemoglobin,  I will place her on Luspatercept, with the hope that the  myelodysplastic component of her anemia will respond to such injections.  She will receive Luspatercept once every 3 weeks.  Ultimately, the goal is to get her hemoglobin above 10-11.  Her first Luspatercept injection will be next week.  I will see her back in 12 weeks to reassess her anemia and see how well she responded to her first few doses of Luspatercept.  The patient understands all the plans discussed today and is in agreement with them.  Saron Vanorman Macarthur Critchley, MD

## 2022-03-06 ENCOUNTER — Inpatient Hospital Stay: Payer: Medicare PPO | Attending: Oncology | Admitting: Oncology

## 2022-03-06 ENCOUNTER — Other Ambulatory Visit: Payer: Self-pay | Admitting: Pharmacist

## 2022-03-06 ENCOUNTER — Inpatient Hospital Stay: Payer: Medicare PPO

## 2022-03-06 ENCOUNTER — Other Ambulatory Visit: Payer: Self-pay | Admitting: Oncology

## 2022-03-06 VITALS — BP 166/70 | HR 79 | Temp 97.4°F | Resp 14 | Ht 64.0 in | Wt 138.8 lb

## 2022-03-06 DIAGNOSIS — N184 Chronic kidney disease, stage 4 (severe): Secondary | ICD-10-CM | POA: Diagnosis not present

## 2022-03-06 DIAGNOSIS — D631 Anemia in chronic kidney disease: Secondary | ICD-10-CM | POA: Diagnosis not present

## 2022-03-06 DIAGNOSIS — D508 Other iron deficiency anemias: Secondary | ICD-10-CM | POA: Diagnosis not present

## 2022-03-06 DIAGNOSIS — D469 Myelodysplastic syndrome, unspecified: Secondary | ICD-10-CM

## 2022-03-06 DIAGNOSIS — D649 Anemia, unspecified: Secondary | ICD-10-CM | POA: Diagnosis not present

## 2022-03-06 DIAGNOSIS — Z79899 Other long term (current) drug therapy: Secondary | ICD-10-CM | POA: Insufficient documentation

## 2022-03-06 DIAGNOSIS — D460 Refractory anemia without ring sideroblasts, so stated: Secondary | ICD-10-CM | POA: Insufficient documentation

## 2022-03-06 LAB — BASIC METABOLIC PANEL
BUN: 57 — AB (ref 4–21)
CO2: 24 — AB (ref 13–22)
Chloride: 103 (ref 99–108)
Creatinine: 2.7 — AB (ref 0.5–1.1)
Glucose: 88
Potassium: 4.7 mEq/L (ref 3.5–5.1)
Sodium: 136 — AB (ref 137–147)

## 2022-03-06 LAB — HEPATIC FUNCTION PANEL
ALT: 19 U/L (ref 7–35)
AST: 29 (ref 13–35)
Alkaline Phosphatase: 99 (ref 25–125)
Bilirubin, Total: 0.3

## 2022-03-06 LAB — CBC AND DIFFERENTIAL
HCT: 24 — AB (ref 36–46)
Hemoglobin: 7.6 — AB (ref 12.0–16.0)
Neutrophils Absolute: 5.03
Platelets: 279 10*3/uL (ref 150–400)
WBC: 7.5

## 2022-03-06 LAB — COMPREHENSIVE METABOLIC PANEL
Albumin: 3.7 (ref 3.5–5.0)
Calcium: 8.8 (ref 8.7–10.7)

## 2022-03-06 LAB — FERRITIN: Ferritin: 34 ng/mL (ref 11–307)

## 2022-03-06 LAB — CBC: RBC: 2.56 — AB (ref 3.87–5.11)

## 2022-03-06 LAB — IRON AND TIBC
Iron: 39 ug/dL (ref 28–170)
Saturation Ratios: 12 % (ref 10.4–31.8)
TIBC: 314 ug/dL (ref 250–450)
UIBC: 275 ug/dL

## 2022-03-07 ENCOUNTER — Other Ambulatory Visit: Payer: Self-pay

## 2022-03-08 ENCOUNTER — Other Ambulatory Visit: Payer: Self-pay

## 2022-03-11 MED FILL — Luspatercept-aamt For Subcutaneous Inj 75 MG: SUBCUTANEOUS | Qty: 1.3 | Status: AC

## 2022-03-11 NOTE — Progress Notes (Signed)
Ok to proceed with Reblozyl w Hg - 7.6 from 03/05/22.  Raul Del [place], [place], BCPS, BCOP 03/11/2022 12:47 PM

## 2022-03-12 ENCOUNTER — Inpatient Hospital Stay: Payer: Medicare PPO

## 2022-03-12 VITALS — BP 130/58 | HR 69 | Temp 97.8°F | Resp 18 | Ht 64.0 in | Wt 136.0 lb

## 2022-03-12 DIAGNOSIS — N184 Chronic kidney disease, stage 4 (severe): Secondary | ICD-10-CM | POA: Diagnosis not present

## 2022-03-12 DIAGNOSIS — D469 Myelodysplastic syndrome, unspecified: Secondary | ICD-10-CM

## 2022-03-12 DIAGNOSIS — D631 Anemia in chronic kidney disease: Secondary | ICD-10-CM | POA: Diagnosis not present

## 2022-03-12 DIAGNOSIS — Z79899 Other long term (current) drug therapy: Secondary | ICD-10-CM | POA: Diagnosis not present

## 2022-03-12 DIAGNOSIS — D460 Refractory anemia without ring sideroblasts, so stated: Secondary | ICD-10-CM | POA: Diagnosis not present

## 2022-03-12 MED ORDER — LUSPATERCEPT-AAMT 75 MG ~~LOC~~ SOLR
1.0000 mg/kg | Freq: Once | SUBCUTANEOUS | Status: AC
  Administered 2022-03-12: 65 mg via SUBCUTANEOUS
  Filled 2022-03-12: qty 1.3

## 2022-03-12 NOTE — Patient Instructions (Signed)
Luspatercept Injection What is this medication? LUSPATERCEPT (lus PAT er sept) treats low levels of red blood cells (anemia) in the body in people with beta thalassemia or myelodysplastic syndromes. It works by helping the body make more red blood cells. This medicine may be used for other purposes; ask your health care provider or pharmacist if you have questions. COMMON BRAND NAME(S): REBLOZYL What should I tell my care team before I take this medication? They need to know if you have any of these conditions: Have had your spleen removed High blood pressure History of blood clots Tobacco use An unusual or allergic reaction to luspatercept, other medications, foods, dyes or preservatives Pregnant or trying to get pregnant Breast-feeding How should I use this medication? This medication is for injection under the skin. It is given by your care team in a hospital or clinic setting. Talk to your care team about the use of the medication in children. This medication is not approved for use in children. Overdosage: If you think you have taken too much of this medicine contact a poison control center or emergency room at once. NOTE: This medicine is only for you. Do not share this medicine with others. What if I miss a dose? Keep appointments for follow-up doses. It is important not to miss your dose. Call your care team if you are unable to keep an appointment. What may interact with this medication? Interactions are not expected. This list may not describe all possible interactions. Give your health care provider a list of all the medicines, herbs, non-prescription drugs, or dietary supplements you use. Also tell them if you smoke, drink alcohol, or use illegal drugs. Some items may interact with your medicine. What should I watch for while using this medication? Your condition will be monitored carefully while you are receiving this medication. Talk to your care team if you wish to become  pregnant or think you might be pregnant. This medication can cause serious birth defects. Discuss contraceptive options with your care team. Do not breastfeed while taking this medication. You may need blood work done while you are taking this medication. What side effects may I notice from receiving this medication? Side effects that you should report to your care team as soon as possible: Allergic reactions--skin rash, itching, hives, swelling of the face, lips, tongue, or throat Blood clot--pain, swelling, or warmth in the leg, shortness of breath, chest pain Increase in blood pressure Severe back pain, numbness or weakness of the hands, arms, legs, or feet, loss of coordination, loss of bowel or bladder control Side effects that usually do not require medical attention (report these to your care team if they continue or are bothersome): Bone pain Dizziness Fatigue Headache Joint pain Muscle pain Stomach pain This list may not describe all possible side effects. Call your doctor for medical advice about side effects. You may report side effects to FDA at 1-800-FDA-1088. Where should I keep my medication? This medication is given in a hospital or clinic. It will not be stored at home. NOTE: This sheet is a summary. It may not cover all possible information. If you have questions about this medicine, talk to your doctor, pharmacist, or health care provider.  2023 Elsevier/Gold Standard (2020-11-29 00:00:00)  

## 2022-03-15 ENCOUNTER — Encounter: Payer: Self-pay | Admitting: Oncology

## 2022-03-23 ENCOUNTER — Ambulatory Visit (INDEPENDENT_AMBULATORY_CARE_PROVIDER_SITE_OTHER): Payer: Medicare PPO | Admitting: Podiatry

## 2022-03-23 DIAGNOSIS — L97512 Non-pressure chronic ulcer of other part of right foot with fat layer exposed: Secondary | ICD-10-CM | POA: Diagnosis not present

## 2022-03-23 DIAGNOSIS — B351 Tinea unguium: Secondary | ICD-10-CM | POA: Diagnosis not present

## 2022-03-23 DIAGNOSIS — M79674 Pain in right toe(s): Secondary | ICD-10-CM | POA: Diagnosis not present

## 2022-03-23 DIAGNOSIS — L97522 Non-pressure chronic ulcer of other part of left foot with fat layer exposed: Secondary | ICD-10-CM

## 2022-03-23 DIAGNOSIS — M79675 Pain in left toe(s): Secondary | ICD-10-CM

## 2022-03-23 DIAGNOSIS — L84 Corns and callosities: Secondary | ICD-10-CM

## 2022-03-23 DIAGNOSIS — E1140 Type 2 diabetes mellitus with diabetic neuropathy, unspecified: Secondary | ICD-10-CM

## 2022-03-23 NOTE — Progress Notes (Signed)
Subjective:  Patient ID: Maureen Stout, female    DOB: August 08, 1931,  MRN: [id number]  Chief Complaint  Patient presents with   Wound Check    Wound check. Patient has bilateral ball of foot near hallux and 2nd toe on left foot. Blood sugar this morning 107. Patient has applied silvadene to wounds occasionally.    Nail Problem    RFC    88 y.o. female presents for routine nail care as well as check of the wounds that have been present on both feet for very long time.  She says she has been putting a dressing on them.  She does dress them with Silvadene cream as well.  She reports the wounds periodically improve and then get worse again.  She denies any drainage nausea vomiting fever chills from these wounds.  Past Medical History:  Diagnosis Date   Abnormal CXR 04/13/2018   Abnormality of gait 10/19/2018   Acute blood loss anemia    Acute combined systolic and diastolic congestive heart failure (HCC)    Acute on chronic anemia 09/07/2018   Acute on chronic kidney failure (HCC)    Acute pancreatitis without infection or necrosis 09/07/2018   Anemia of chronic disease    Anemia of chronic renal failure    Bacteremia 09/08/2018   Bladder problem    Bradycardia 09/07/2018   Cardiomegaly    Chronic anticoagulation 01/08/2018   CKD (chronic kidney disease), stage III ([place])    Debility 09/19/2018   Diabetes mellitus ([place])    Dyslipidemia    Edema    Esophageal dysmotility    Failure to thrive (child)    First degree AV block 09/06/2018   Gross hematuria 07/05/2017   Hypertension    Hypertensive heart disease 01/08/2018   Hypoglycemia without diagnosis of diabetes mellitus 09/08/2018   Hypothyroidism 09/06/2018   OAB (overactive bladder) 07/05/2017   On amiodarone therapy 01/08/2018   PAH (pulmonary artery hypertension) (HCC)    Paroxysmal A-fib (HCC)    Paroxysmal atrial fibrillation ([place]) 04/18/2017   Paroxysmal atrial flutter ([place])    Recurrent UTI    Sepsis due to Enterobacter  species ([place]) 09/08/2018   Sinus bradycardia    Urinary retention     Allergies  Allergen Reactions   Vancomycin Rash   Uribel [Meth-Hyo-M Bl-Na Phos-Ph Sal]     ROS: Negative except as per HPI above  Objective:  General: AAO x3, NAD  Dermatological: On the right foot attention directed the plantar aspect of the first metatarsophalangeal joint there is no to be circular ulceration present to the level of subcutaneous fat tissue.  On the left foot there is also noted to be a circular ulceration present to subcutaneous fat tissue at the plantar first MPJ.  Also with a small ulceration present at distal aspect of the left second toe to level of subcutaneous fat tissue.  No malodor or drainage erythema noted from any of these wounds.  No clinical signs of infection are present.  There is hyperkeratotic tissue surrounding and overlying all of these ulcerations.  Vascular:  Dorsalis Pedis artery and Posterior Tibial artery pedal pulses are 2/4 bilateral.  Capillary fill time < 3 sec to all digits.   Neruologic: Grossly intact via light touch bilateral. Protective threshold intact to all sites bilateral.   Musculoskeletal: Hallux extensors deformity noted bilaterally.  Hammertoe deformity of the left second toe   Gait: Unassisted, Nonantalgic.        Assessment:   1. Pain due  to onychomycosis of toenails of both feet   2. Ulcer of left foot with fat layer exposed ([place])   3. Ulcer of right foot with fat layer exposed ([place])   4. Type 2 diabetes, controlled, with neuropathy ([place])   5. Pre-ulcerative calluses      Plan:  Patient was evaluated and treated and all questions answered.  #Ulcer bilateral plantar aspect first metatarsal phalangeal joint as well as the distal tuft of the left second toe all to subcutaneous fat level, no evidence of infection at this time -We discussed the etiology and factors that are a part of the wound healing process.  We also discussed the risk of  infection both soft tissue and osteomyelitis from open ulceration.  Discussed the risk of limb loss if this happens or worsens. -Debridement as below. -Dressed with Betadine ointment, DSD. -Continue home dressing changes daily with Silvadene and dry sterile dressing or Band-Aid -Continue off-loading with felt padding. -Vascular testing deferred -Last antibiotics: No antibiotics indicated at this visit. -Imaging: Deferred x-ray imaging -Referral placed to [place] wound care center as a believe patient would benefit from increased frequency and continuity of local wound care for these ulcerations.  Procedure: Excisional Debridement of Wound Rationale: Removal of non-viable soft tissue from the wound to promote healing.  Anesthesia: none Post-Debridement Wound Measurements: 1 cm x 0.7 cm x 0.2 cm right foot 1st mPj, 1x1x0.2 Left 1st mpj. 0.5x0.5x0.2 cm Left 2nd toe Type of Debridement: Sharp Excisional with 10 blade scalpel the FAQ of the Tissue Removed: Non-viable soft tissue Depth of Debridement: subcutaneous tissue. Technique: Sharp excisional debridement to bleeding, viable wound base.  Dressing: Dry, sterile, compression dressing. Disposition: Patient tolerated procedure well.   #Onychomycosis with pain  -Nails palliatively debrided as below -Educated on self-care  Procedure: Nail Debridement bilateral foot  Rationale: Pain Type of Debridement: manual, sharp debridement. Instrumentation: Nail nipper, rotary burr. Number of Nails: 10   Return in about 10 weeks (around 06/01/2022) for RFC and wound check.          Everitt Amber, DPM [facility]

## 2022-03-24 ENCOUNTER — Other Ambulatory Visit: Payer: Self-pay

## 2022-03-25 ENCOUNTER — Encounter: Payer: Self-pay | Admitting: Oncology

## 2022-04-01 ENCOUNTER — Inpatient Hospital Stay: Payer: Medicare PPO | Attending: Oncology

## 2022-04-01 DIAGNOSIS — N184 Chronic kidney disease, stage 4 (severe): Secondary | ICD-10-CM | POA: Insufficient documentation

## 2022-04-01 DIAGNOSIS — D460 Refractory anemia without ring sideroblasts, so stated: Secondary | ICD-10-CM | POA: Insufficient documentation

## 2022-04-01 DIAGNOSIS — D631 Anemia in chronic kidney disease: Secondary | ICD-10-CM | POA: Insufficient documentation

## 2022-04-01 DIAGNOSIS — Z79899 Other long term (current) drug therapy: Secondary | ICD-10-CM | POA: Insufficient documentation

## 2022-04-01 DIAGNOSIS — D469 Myelodysplastic syndrome, unspecified: Secondary | ICD-10-CM

## 2022-04-01 LAB — CBC WITH DIFFERENTIAL (CANCER CENTER ONLY)
Abs Immature Granulocytes: 0.07 10*3/uL (ref 0.00–0.07)
Basophils Absolute: 0.1 10*3/uL (ref 0.0–0.1)
Basophils Relative: 1 %
Eosinophils Absolute: 0.2 10*3/uL (ref 0.0–0.5)
Eosinophils Relative: 3 %
HCT: 26.3 % — ABNORMAL LOW (ref 36.0–46.0)
Hemoglobin: 7.6 g/dL — ABNORMAL LOW (ref 12.0–15.0)
Immature Granulocytes: 1 %
Lymphocytes Relative: 25 %
Lymphs Abs: 1.5 10*3/uL (ref 0.7–4.0)
MCH: 29.2 pg (ref 26.0–34.0)
MCHC: 28.9 g/dL — ABNORMAL LOW (ref 30.0–36.0)
MCV: 101.2 fL — ABNORMAL HIGH (ref 80.0–100.0)
Monocytes Absolute: 0.4 10*3/uL (ref 0.1–1.0)
Monocytes Relative: 7 %
Neutro Abs: 4 10*3/uL (ref 1.7–7.7)
Neutrophils Relative %: 63 %
Platelet Count: 277 10*3/uL (ref 150–400)
RBC: 2.6 MIL/uL — ABNORMAL LOW (ref 3.87–5.11)
RDW: 18.6 % — ABNORMAL HIGH (ref 11.5–15.5)
WBC Count: 6.3 10*3/uL (ref 4.0–10.5)
nRBC: 0.3 % — ABNORMAL HIGH (ref 0.0–0.2)

## 2022-04-01 LAB — SAMPLE TO BLOOD BANK

## 2022-04-01 MED FILL — Luspatercept-aamt For Subcutaneous Inj 75 MG: SUBCUTANEOUS | Qty: 1.3 | Status: AC

## 2022-04-02 ENCOUNTER — Inpatient Hospital Stay: Payer: Medicare PPO

## 2022-04-02 ENCOUNTER — Other Ambulatory Visit: Payer: Self-pay | Admitting: Oncology

## 2022-04-02 VITALS — BP 145/69 | HR 73 | Resp 18 | Ht 64.0 in | Wt 134.5 lb

## 2022-04-02 DIAGNOSIS — Z79899 Other long term (current) drug therapy: Secondary | ICD-10-CM | POA: Diagnosis not present

## 2022-04-02 DIAGNOSIS — D460 Refractory anemia without ring sideroblasts, so stated: Secondary | ICD-10-CM | POA: Diagnosis not present

## 2022-04-02 DIAGNOSIS — D469 Myelodysplastic syndrome, unspecified: Secondary | ICD-10-CM

## 2022-04-02 DIAGNOSIS — N184 Chronic kidney disease, stage 4 (severe): Secondary | ICD-10-CM | POA: Diagnosis not present

## 2022-04-02 DIAGNOSIS — D631 Anemia in chronic kidney disease: Secondary | ICD-10-CM | POA: Diagnosis not present

## 2022-04-02 LAB — PREPARE RBC (CROSSMATCH)

## 2022-04-02 MED ORDER — LUSPATERCEPT-AAMT 75 MG ~~LOC~~ SOLR
1.0000 mg/kg | Freq: Once | SUBCUTANEOUS | Status: AC
  Administered 2022-04-02: 65 mg via SUBCUTANEOUS
  Filled 2022-04-02: qty 1.3

## 2022-04-02 NOTE — Patient Instructions (Signed)
Luspatercept Injection What is this medication? LUSPATERCEPT (lus PAT er sept) treats low levels of red blood cells (anemia) in the body in people with beta thalassemia or myelodysplastic syndromes. It works by helping the body make more red blood cells. This medicine may be used for other purposes; ask your health care provider or pharmacist if you have questions. COMMON BRAND NAME(S): REBLOZYL What should I tell my care team before I take this medication? They need to know if you have any of these conditions: Have had your spleen removed High blood pressure History of blood clots Tobacco use An unusual or allergic reaction to luspatercept, other medications, foods, dyes or preservatives Pregnant or trying to get pregnant Breast-feeding How should I use this medication? This medication is for injection under the skin. It is given by your care team in a hospital or clinic setting. Talk to your care team about the use of the medication in children. This medication is not approved for use in children. Overdosage: If you think you have taken too much of this medicine contact a poison control center or emergency room at once. NOTE: This medicine is only for you. Do not share this medicine with others. What if I miss a dose? Keep appointments for follow-up doses. It is important not to miss your dose. Call your care team if you are unable to keep an appointment. What may interact with this medication? Interactions are not expected. This list may not describe all possible interactions. Give your health care provider a list of all the medicines, herbs, non-prescription drugs, or dietary supplements you use. Also tell them if you smoke, drink alcohol, or use illegal drugs. Some items may interact with your medicine. What should I watch for while using this medication? Your condition will be monitored carefully while you are receiving this medication. Talk to your care team if you wish to become  pregnant or think you might be pregnant. This medication can cause serious birth defects. Discuss contraceptive options with your care team. Do not breastfeed while taking this medication. You may need blood work done while you are taking this medication. What side effects may I notice from receiving this medication? Side effects that you should report to your care team as soon as possible: Allergic reactions--skin rash, itching, hives, swelling of the face, lips, tongue, or throat Blood clot--pain, swelling, or warmth in the leg, shortness of breath, chest pain Increase in blood pressure Severe back pain, numbness or weakness of the hands, arms, legs, or feet, loss of coordination, loss of bowel or bladder control Side effects that usually do not require medical attention (report these to your care team if they continue or are bothersome): Bone pain Dizziness Fatigue Headache Joint pain Muscle pain Stomach pain This list may not describe all possible side effects. Call your doctor for medical advice about side effects. You may report side effects to FDA at 1-800-FDA-1088. Where should I keep my medication? This medication is given in a hospital or clinic. It will not be stored at home. NOTE: This sheet is a summary. It may not cover all possible information. If you have questions about this medicine, talk to your doctor, pharmacist, or health care provider.  2023 Elsevier/Gold Standard (2020-11-29 00:00:00)  

## 2022-04-03 ENCOUNTER — Inpatient Hospital Stay: Payer: Medicare PPO

## 2022-04-03 DIAGNOSIS — Z79899 Other long term (current) drug therapy: Secondary | ICD-10-CM | POA: Diagnosis not present

## 2022-04-03 DIAGNOSIS — D631 Anemia in chronic kidney disease: Secondary | ICD-10-CM | POA: Diagnosis not present

## 2022-04-03 DIAGNOSIS — D460 Refractory anemia without ring sideroblasts, so stated: Secondary | ICD-10-CM | POA: Diagnosis not present

## 2022-04-03 DIAGNOSIS — D469 Myelodysplastic syndrome, unspecified: Secondary | ICD-10-CM

## 2022-04-03 DIAGNOSIS — N184 Chronic kidney disease, stage 4 (severe): Secondary | ICD-10-CM | POA: Diagnosis not present

## 2022-04-03 MED ORDER — DIPHENHYDRAMINE HCL 25 MG PO CAPS
25.0000 mg | ORAL_CAPSULE | Freq: Once | ORAL | Status: AC
  Administered 2022-04-03: 25 mg via ORAL
  Filled 2022-04-03: qty 1

## 2022-04-03 MED ORDER — ACETAMINOPHEN 325 MG PO TABS
650.0000 mg | ORAL_TABLET | Freq: Once | ORAL | Status: AC
  Administered 2022-04-03: 650 mg via ORAL
  Filled 2022-04-03: qty 2

## 2022-04-03 MED ORDER — SODIUM CHLORIDE 0.9% IV SOLUTION
250.0000 mL | Freq: Once | INTRAVENOUS | Status: AC
  Administered 2022-04-03: 250 mL via INTRAVENOUS

## 2022-04-03 NOTE — Patient Instructions (Signed)

## 2022-04-06 LAB — TYPE AND SCREEN
ABO/RH(D): O POS
Antibody Screen: NEGATIVE
Unit division: 0
Unit division: 0

## 2022-04-06 LAB — BPAM RBC
Blood Product Expiration Date: 202312152359
Blood Product Expiration Date: 202312162359
ISSUE DATE / TIME: 202311171047
ISSUE DATE / TIME: 202311171047
Unit Type and Rh: 5100
Unit Type and Rh: 5100

## 2022-04-19 ENCOUNTER — Encounter: Payer: Self-pay | Admitting: Oncology

## 2022-04-20 DIAGNOSIS — E785 Hyperlipidemia, unspecified: Secondary | ICD-10-CM | POA: Diagnosis not present

## 2022-04-20 DIAGNOSIS — I5040 Unspecified combined systolic (congestive) and diastolic (congestive) heart failure: Secondary | ICD-10-CM | POA: Diagnosis not present

## 2022-04-20 DIAGNOSIS — D638 Anemia in other chronic diseases classified elsewhere: Secondary | ICD-10-CM | POA: Diagnosis not present

## 2022-04-20 DIAGNOSIS — E1142 Type 2 diabetes mellitus with diabetic polyneuropathy: Secondary | ICD-10-CM | POA: Diagnosis not present

## 2022-04-20 DIAGNOSIS — I110 Hypertensive heart disease with heart failure: Secondary | ICD-10-CM | POA: Diagnosis not present

## 2022-04-20 DIAGNOSIS — N184 Chronic kidney disease, stage 4 (severe): Secondary | ICD-10-CM | POA: Diagnosis not present

## 2022-04-20 DIAGNOSIS — I482 Chronic atrial fibrillation, unspecified: Secondary | ICD-10-CM | POA: Diagnosis not present

## 2022-04-20 DIAGNOSIS — E039 Hypothyroidism, unspecified: Secondary | ICD-10-CM | POA: Diagnosis not present

## 2022-04-20 DIAGNOSIS — R350 Frequency of micturition: Secondary | ICD-10-CM | POA: Diagnosis not present

## 2022-04-22 ENCOUNTER — Inpatient Hospital Stay: Payer: Medicare PPO | Attending: Oncology

## 2022-04-22 ENCOUNTER — Encounter: Payer: Self-pay | Admitting: Oncology

## 2022-04-22 DIAGNOSIS — D649 Anemia, unspecified: Secondary | ICD-10-CM | POA: Diagnosis not present

## 2022-04-22 DIAGNOSIS — D460 Refractory anemia without ring sideroblasts, so stated: Secondary | ICD-10-CM | POA: Insufficient documentation

## 2022-04-22 DIAGNOSIS — D631 Anemia in chronic kidney disease: Secondary | ICD-10-CM | POA: Insufficient documentation

## 2022-04-22 DIAGNOSIS — N184 Chronic kidney disease, stage 4 (severe): Secondary | ICD-10-CM | POA: Insufficient documentation

## 2022-04-22 DIAGNOSIS — Z79899 Other long term (current) drug therapy: Secondary | ICD-10-CM | POA: Insufficient documentation

## 2022-04-22 DIAGNOSIS — D469 Myelodysplastic syndrome, unspecified: Secondary | ICD-10-CM

## 2022-04-22 LAB — CBC: RBC: 3.13 — AB (ref 3.87–5.11)

## 2022-04-22 LAB — CBC AND DIFFERENTIAL
HCT: 29 — AB (ref 36–46)
Hemoglobin: 9.7 — AB (ref 12.0–16.0)
MCV: 94 (ref 81–99)
Neutrophils Absolute: 4.62
Platelets: 228 10*3/uL (ref 150–400)
WBC: 6.9

## 2022-04-23 ENCOUNTER — Inpatient Hospital Stay: Payer: Medicare PPO

## 2022-04-23 ENCOUNTER — Other Ambulatory Visit: Payer: Self-pay | Admitting: Hematology and Oncology

## 2022-04-23 VITALS — BP 146/77 | HR 70 | Temp 98.5°F | Resp 18

## 2022-04-23 DIAGNOSIS — D460 Refractory anemia without ring sideroblasts, so stated: Secondary | ICD-10-CM | POA: Diagnosis not present

## 2022-04-23 DIAGNOSIS — D469 Myelodysplastic syndrome, unspecified: Secondary | ICD-10-CM

## 2022-04-23 DIAGNOSIS — N184 Chronic kidney disease, stage 4 (severe): Secondary | ICD-10-CM | POA: Diagnosis not present

## 2022-04-23 DIAGNOSIS — Z79899 Other long term (current) drug therapy: Secondary | ICD-10-CM | POA: Diagnosis not present

## 2022-04-23 DIAGNOSIS — D631 Anemia in chronic kidney disease: Secondary | ICD-10-CM | POA: Diagnosis not present

## 2022-04-23 MED ORDER — LUSPATERCEPT-AAMT 25 MG ~~LOC~~ SOLR
1.3300 mg/kg | Freq: Once | SUBCUTANEOUS | Status: AC
  Administered 2022-04-23: 85 mg via SUBCUTANEOUS
  Filled 2022-04-23: qty 1.5

## 2022-04-23 NOTE — Patient Instructions (Signed)
Luspatercept Injection What is this medication? LUSPATERCEPT (lus PAT er sept) treats low levels of red blood cells (anemia) in the body in people with beta thalassemia or myelodysplastic syndromes. It works by helping the body make more red blood cells. This medicine may be used for other purposes; ask your health care provider or pharmacist if you have questions. COMMON BRAND NAME(S): REBLOZYL What should I tell my care team before I take this medication? They need to know if you have any of these conditions: Have had your spleen removed High blood pressure History of blood clots Tobacco use An unusual or allergic reaction to luspatercept, other medications, foods, dyes or preservatives Pregnant or trying to get pregnant Breast-feeding How should I use this medication? This medication is for injection under the skin. It is given by your care team in a hospital or clinic setting. Talk to your care team about the use of the medication in children. This medication is not approved for use in children. Overdosage: If you think you have taken too much of this medicine contact a poison control center or emergency room at once. NOTE: This medicine is only for you. Do not share this medicine with others. What if I miss a dose? Keep appointments for follow-up doses. It is important not to miss your dose. Call your care team if you are unable to keep an appointment. What may interact with this medication? Interactions are not expected. This list may not describe all possible interactions. Give your health care provider a list of all the medicines, herbs, non-prescription drugs, or dietary supplements you use. Also tell them if you smoke, drink alcohol, or use illegal drugs. Some items may interact with your medicine. What should I watch for while using this medication? Your condition will be monitored carefully while you are receiving this medication. Talk to your care team if you wish to become  pregnant or think you might be pregnant. This medication can cause serious birth defects. Discuss contraceptive options with your care team. Do not breastfeed while taking this medication. You may need blood work done while you are taking this medication. What side effects may I notice from receiving this medication? Side effects that you should report to your care team as soon as possible: Allergic reactions--skin rash, itching, hives, swelling of the face, lips, tongue, or throat Blood clot--pain, swelling, or warmth in the leg, shortness of breath, chest pain Increase in blood pressure Severe back pain, numbness or weakness of the hands, arms, legs, or feet, loss of coordination, loss of bowel or bladder control Side effects that usually do not require medical attention (report these to your care team if they continue or are bothersome): Bone pain Dizziness Fatigue Headache Joint pain Muscle pain Stomach pain This list may not describe all possible side effects. Call your doctor for medical advice about side effects. You may report side effects to FDA at 1-800-FDA-1088. Where should I keep my medication? This medication is given in a hospital or clinic. It will not be stored at home. NOTE: This sheet is a summary. It may not cover all possible information. If you have questions about this medicine, talk to your doctor, pharmacist, or health care provider.  2023 Elsevier/Gold Standard (2020-11-29 00:00:00)  

## 2022-04-24 ENCOUNTER — Other Ambulatory Visit: Payer: Self-pay

## 2022-04-30 ENCOUNTER — Encounter: Payer: Self-pay | Admitting: Oncology

## 2022-05-01 DIAGNOSIS — I130 Hypertensive heart and chronic kidney disease with heart failure and stage 1 through stage 4 chronic kidney disease, or unspecified chronic kidney disease: Secondary | ICD-10-CM | POA: Diagnosis not present

## 2022-05-01 DIAGNOSIS — N184 Chronic kidney disease, stage 4 (severe): Secondary | ICD-10-CM | POA: Diagnosis not present

## 2022-05-01 DIAGNOSIS — E1122 Type 2 diabetes mellitus with diabetic chronic kidney disease: Secondary | ICD-10-CM | POA: Diagnosis not present

## 2022-05-01 DIAGNOSIS — I5022 Chronic systolic (congestive) heart failure: Secondary | ICD-10-CM | POA: Diagnosis not present

## 2022-05-01 DIAGNOSIS — R339 Retention of urine, unspecified: Secondary | ICD-10-CM | POA: Diagnosis not present

## 2022-05-01 DIAGNOSIS — R42 Dizziness and giddiness: Secondary | ICD-10-CM | POA: Diagnosis not present

## 2022-05-01 DIAGNOSIS — R809 Proteinuria, unspecified: Secondary | ICD-10-CM | POA: Diagnosis not present

## 2022-05-12 ENCOUNTER — Inpatient Hospital Stay: Payer: Medicare PPO

## 2022-05-12 DIAGNOSIS — N184 Chronic kidney disease, stage 4 (severe): Secondary | ICD-10-CM | POA: Diagnosis not present

## 2022-05-12 DIAGNOSIS — D460 Refractory anemia without ring sideroblasts, so stated: Secondary | ICD-10-CM | POA: Diagnosis not present

## 2022-05-12 DIAGNOSIS — D469 Myelodysplastic syndrome, unspecified: Secondary | ICD-10-CM

## 2022-05-12 DIAGNOSIS — D631 Anemia in chronic kidney disease: Secondary | ICD-10-CM | POA: Diagnosis not present

## 2022-05-12 DIAGNOSIS — Z79899 Other long term (current) drug therapy: Secondary | ICD-10-CM | POA: Diagnosis not present

## 2022-05-12 LAB — CBC WITH DIFFERENTIAL (CANCER CENTER ONLY)
Abs Immature Granulocytes: 0.26 10*3/uL — ABNORMAL HIGH (ref 0.00–0.07)
Basophils Absolute: 0 10*3/uL (ref 0.0–0.1)
Basophils Relative: 1 %
Eosinophils Absolute: 0.2 10*3/uL (ref 0.0–0.5)
Eosinophils Relative: 4 %
HCT: 29.1 % — ABNORMAL LOW (ref 36.0–46.0)
Hemoglobin: 8.8 g/dL — ABNORMAL LOW (ref 12.0–15.0)
Immature Granulocytes: 5 %
Lymphocytes Relative: 23 %
Lymphs Abs: 1.3 10*3/uL (ref 0.7–4.0)
MCH: 31.7 pg (ref 26.0–34.0)
MCHC: 30.2 g/dL (ref 30.0–36.0)
MCV: 104.7 fL — ABNORMAL HIGH (ref 80.0–100.0)
Monocytes Absolute: 0.4 10*3/uL (ref 0.1–1.0)
Monocytes Relative: 6 %
Neutro Abs: 3.5 10*3/uL (ref 1.7–7.7)
Neutrophils Relative %: 61 %
Platelet Count: 199 10*3/uL (ref 150–400)
RBC: 2.78 MIL/uL — ABNORMAL LOW (ref 3.87–5.11)
RDW: 20.4 % — ABNORMAL HIGH (ref 11.5–15.5)
WBC Count: 5.7 10*3/uL (ref 4.0–10.5)
nRBC: 2.6 % — ABNORMAL HIGH (ref 0.0–0.2)

## 2022-05-13 MED FILL — Luspatercept-aamt For Subcutaneous Inj 25 MG: SUBCUTANEOUS | Qty: 1.7 | Status: AC

## 2022-05-14 ENCOUNTER — Inpatient Hospital Stay: Payer: Medicare PPO

## 2022-05-14 DIAGNOSIS — N184 Chronic kidney disease, stage 4 (severe): Secondary | ICD-10-CM | POA: Diagnosis not present

## 2022-05-14 DIAGNOSIS — Z79899 Other long term (current) drug therapy: Secondary | ICD-10-CM | POA: Diagnosis not present

## 2022-05-14 DIAGNOSIS — D631 Anemia in chronic kidney disease: Secondary | ICD-10-CM | POA: Diagnosis not present

## 2022-05-14 DIAGNOSIS — D460 Refractory anemia without ring sideroblasts, so stated: Secondary | ICD-10-CM | POA: Diagnosis not present

## 2022-05-14 DIAGNOSIS — D469 Myelodysplastic syndrome, unspecified: Secondary | ICD-10-CM

## 2022-05-14 MED ORDER — LUSPATERCEPT-AAMT 25 MG ~~LOC~~ SOLR
1.3300 mg/kg | Freq: Once | SUBCUTANEOUS | Status: AC
  Administered 2022-05-14: 85 mg via SUBCUTANEOUS
  Filled 2022-05-14: qty 1.5

## 2022-05-14 NOTE — Patient Instructions (Signed)
Luspatercept Injection What is this medication? LUSPATERCEPT (lus PAT er sept) treats low levels of red blood cells (anemia) in the body in people with beta thalassemia or myelodysplastic syndromes. It works by helping the body make more red blood cells. This medicine may be used for other purposes; ask your health care provider or pharmacist if you have questions. COMMON BRAND NAME(S): REBLOZYL What should I tell my care team before I take this medication? They need to know if you have any of these conditions: Have had your spleen removed High blood pressure History of blood clots Tobacco use An unusual or allergic reaction to luspatercept, other medications, foods, dyes or preservatives Pregnant or trying to get pregnant Breast-feeding How should I use this medication? This medication is for injection under the skin. It is given by your care team in a hospital or clinic setting. Talk to your care team about the use of the medication in children. This medication is not approved for use in children. Overdosage: If you think you have taken too much of this medicine contact a poison control center or emergency room at once. NOTE: This medicine is only for you. Do not share this medicine with others. What if I miss a dose? Keep appointments for follow-up doses. It is important not to miss your dose. Call your care team if you are unable to keep an appointment. What may interact with this medication? Interactions are not expected. This list may not describe all possible interactions. Give your health care provider a list of all the medicines, herbs, non-prescription drugs, or dietary supplements you use. Also tell them if you smoke, drink alcohol, or use illegal drugs. Some items may interact with your medicine. What should I watch for while using this medication? Your condition will be monitored carefully while you are receiving this medication. Talk to your care team if you wish to become  pregnant or think you might be pregnant. This medication can cause serious birth defects. Discuss contraceptive options with your care team. Do not breastfeed while taking this medication. You may need blood work done while you are taking this medication. What side effects may I notice from receiving this medication? Side effects that you should report to your care team as soon as possible: Allergic reactions--skin rash, itching, hives, swelling of the face, lips, tongue, or throat Blood clot--pain, swelling, or warmth in the leg, shortness of breath, chest pain Increase in blood pressure Severe back pain, numbness or weakness of the hands, arms, legs, or feet, loss of coordination, loss of bowel or bladder control Side effects that usually do not require medical attention (report these to your care team if they continue or are bothersome): Bone pain Dizziness Fatigue Headache Joint pain Muscle pain Stomach pain This list may not describe all possible side effects. Call your doctor for medical advice about side effects. You may report side effects to FDA at 1-800-FDA-1088. Where should I keep my medication? This medication is given in a hospital or clinic. It will not be stored at home. NOTE: This sheet is a summary. It may not cover all possible information. If you have questions about this medicine, talk to your doctor, pharmacist, or health care provider.  2023 Elsevier/Gold Standard (2020-11-29 00:00:00)  

## 2022-05-17 DIAGNOSIS — E785 Hyperlipidemia, unspecified: Secondary | ICD-10-CM | POA: Diagnosis not present

## 2022-05-17 DIAGNOSIS — E1142 Type 2 diabetes mellitus with diabetic polyneuropathy: Secondary | ICD-10-CM | POA: Diagnosis not present

## 2022-05-17 DIAGNOSIS — N184 Chronic kidney disease, stage 4 (severe): Secondary | ICD-10-CM | POA: Diagnosis not present

## 2022-05-21 DIAGNOSIS — S022XXD Fracture of nasal bones, subsequent encounter for fracture with routine healing: Secondary | ICD-10-CM | POA: Diagnosis not present

## 2022-05-21 DIAGNOSIS — Z043 Encounter for examination and observation following other accident: Secondary | ICD-10-CM | POA: Diagnosis not present

## 2022-05-21 DIAGNOSIS — S022XXA Fracture of nasal bones, initial encounter for closed fracture: Secondary | ICD-10-CM | POA: Diagnosis not present

## 2022-05-21 DIAGNOSIS — A419 Sepsis, unspecified organism: Secondary | ICD-10-CM | POA: Diagnosis not present

## 2022-05-21 DIAGNOSIS — S0181XA Laceration without foreign body of other part of head, initial encounter: Secondary | ICD-10-CM | POA: Diagnosis not present

## 2022-05-21 DIAGNOSIS — I959 Hypotension, unspecified: Secondary | ICD-10-CM | POA: Diagnosis not present

## 2022-05-21 DIAGNOSIS — S0590XA Unspecified injury of unspecified eye and orbit, initial encounter: Secondary | ICD-10-CM | POA: Diagnosis not present

## 2022-05-21 DIAGNOSIS — R609 Edema, unspecified: Secondary | ICD-10-CM | POA: Diagnosis not present

## 2022-05-21 DIAGNOSIS — Z1624 Resistance to multiple antibiotics: Secondary | ICD-10-CM | POA: Diagnosis not present

## 2022-05-21 DIAGNOSIS — M47812 Spondylosis without myelopathy or radiculopathy, cervical region: Secondary | ICD-10-CM | POA: Diagnosis not present

## 2022-05-21 DIAGNOSIS — N189 Chronic kidney disease, unspecified: Secondary | ICD-10-CM | POA: Diagnosis not present

## 2022-05-21 DIAGNOSIS — W19XXXA Unspecified fall, initial encounter: Secondary | ICD-10-CM | POA: Diagnosis not present

## 2022-05-21 DIAGNOSIS — E785 Hyperlipidemia, unspecified: Secondary | ICD-10-CM | POA: Diagnosis not present

## 2022-05-21 DIAGNOSIS — N3021 Other chronic cystitis with hematuria: Secondary | ICD-10-CM | POA: Diagnosis not present

## 2022-05-21 DIAGNOSIS — R9431 Abnormal electrocardiogram [ECG] [EKG]: Secondary | ICD-10-CM | POA: Diagnosis not present

## 2022-05-21 DIAGNOSIS — I4891 Unspecified atrial fibrillation: Secondary | ICD-10-CM | POA: Diagnosis not present

## 2022-05-21 DIAGNOSIS — Z7401 Bed confinement status: Secondary | ICD-10-CM | POA: Diagnosis not present

## 2022-05-21 DIAGNOSIS — G3189 Other specified degenerative diseases of nervous system: Secondary | ICD-10-CM | POA: Diagnosis not present

## 2022-05-21 DIAGNOSIS — N184 Chronic kidney disease, stage 4 (severe): Secondary | ICD-10-CM | POA: Diagnosis not present

## 2022-05-21 DIAGNOSIS — N179 Acute kidney failure, unspecified: Secondary | ICD-10-CM | POA: Diagnosis not present

## 2022-05-21 DIAGNOSIS — I130 Hypertensive heart and chronic kidney disease with heart failure and stage 1 through stage 4 chronic kidney disease, or unspecified chronic kidney disease: Secondary | ICD-10-CM | POA: Diagnosis not present

## 2022-05-21 DIAGNOSIS — D631 Anemia in chronic kidney disease: Secondary | ICD-10-CM | POA: Diagnosis not present

## 2022-05-21 DIAGNOSIS — M4802 Spinal stenosis, cervical region: Secondary | ICD-10-CM | POA: Diagnosis not present

## 2022-05-21 DIAGNOSIS — I517 Cardiomegaly: Secondary | ICD-10-CM | POA: Diagnosis not present

## 2022-05-21 DIAGNOSIS — I4510 Unspecified right bundle-branch block: Secondary | ICD-10-CM | POA: Diagnosis not present

## 2022-05-21 DIAGNOSIS — R42 Dizziness and giddiness: Secondary | ICD-10-CM | POA: Diagnosis not present

## 2022-05-25 ENCOUNTER — Telehealth: Payer: Self-pay | Admitting: Oncology

## 2022-05-25 NOTE — Telephone Encounter (Signed)
05/25/22 Spoke with daughter and cancelled all appts.Patient is in the hospital and will be transferred to Rehab facility once she is discharged

## 2022-05-27 DIAGNOSIS — F5101 Primary insomnia: Secondary | ICD-10-CM | POA: Diagnosis not present

## 2022-05-27 DIAGNOSIS — I959 Hypotension, unspecified: Secondary | ICD-10-CM | POA: Diagnosis not present

## 2022-05-27 DIAGNOSIS — A419 Sepsis, unspecified organism: Secondary | ICD-10-CM | POA: Diagnosis not present

## 2022-05-27 DIAGNOSIS — Z7401 Bed confinement status: Secondary | ICD-10-CM | POA: Diagnosis not present

## 2022-05-27 DIAGNOSIS — D649 Anemia, unspecified: Secondary | ICD-10-CM | POA: Diagnosis not present

## 2022-05-27 DIAGNOSIS — I4510 Unspecified right bundle-branch block: Secondary | ICD-10-CM | POA: Diagnosis not present

## 2022-05-27 DIAGNOSIS — R262 Difficulty in walking, not elsewhere classified: Secondary | ICD-10-CM | POA: Diagnosis not present

## 2022-05-27 DIAGNOSIS — S0181XA Laceration without foreign body of other part of head, initial encounter: Secondary | ICD-10-CM | POA: Diagnosis not present

## 2022-05-27 DIAGNOSIS — W19XXXA Unspecified fall, initial encounter: Secondary | ICD-10-CM | POA: Diagnosis not present

## 2022-05-27 DIAGNOSIS — I517 Cardiomegaly: Secondary | ICD-10-CM | POA: Diagnosis not present

## 2022-05-27 DIAGNOSIS — R9431 Abnormal electrocardiogram [ECG] [EKG]: Secondary | ICD-10-CM | POA: Diagnosis not present

## 2022-05-27 DIAGNOSIS — F4320 Adjustment disorder, unspecified: Secondary | ICD-10-CM | POA: Diagnosis not present

## 2022-05-27 DIAGNOSIS — N179 Acute kidney failure, unspecified: Secondary | ICD-10-CM | POA: Diagnosis not present

## 2022-05-27 DIAGNOSIS — I130 Hypertensive heart and chronic kidney disease with heart failure and stage 1 through stage 4 chronic kidney disease, or unspecified chronic kidney disease: Secondary | ICD-10-CM | POA: Diagnosis not present

## 2022-05-27 DIAGNOSIS — N184 Chronic kidney disease, stage 4 (severe): Secondary | ICD-10-CM | POA: Diagnosis not present

## 2022-05-27 DIAGNOSIS — N189 Chronic kidney disease, unspecified: Secondary | ICD-10-CM | POA: Diagnosis not present

## 2022-05-27 DIAGNOSIS — S022XXD Fracture of nasal bones, subsequent encounter for fracture with routine healing: Secondary | ICD-10-CM | POA: Diagnosis not present

## 2022-05-27 DIAGNOSIS — I4891 Unspecified atrial fibrillation: Secondary | ICD-10-CM | POA: Diagnosis not present

## 2022-05-27 DIAGNOSIS — S022XXA Fracture of nasal bones, initial encounter for closed fracture: Secondary | ICD-10-CM | POA: Diagnosis not present

## 2022-05-28 DIAGNOSIS — D649 Anemia, unspecified: Secondary | ICD-10-CM | POA: Diagnosis not present

## 2022-05-28 DIAGNOSIS — N184 Chronic kidney disease, stage 4 (severe): Secondary | ICD-10-CM | POA: Diagnosis not present

## 2022-05-28 DIAGNOSIS — R262 Difficulty in walking, not elsewhere classified: Secondary | ICD-10-CM | POA: Diagnosis not present

## 2022-05-28 DIAGNOSIS — I4891 Unspecified atrial fibrillation: Secondary | ICD-10-CM | POA: Diagnosis not present

## 2022-05-29 ENCOUNTER — Ambulatory Visit: Payer: Medicare PPO | Admitting: Oncology

## 2022-05-29 ENCOUNTER — Ambulatory Visit: Payer: Medicare PPO | Admitting: Podiatry

## 2022-05-29 ENCOUNTER — Other Ambulatory Visit: Payer: Medicare PPO

## 2022-06-01 ENCOUNTER — Ambulatory Visit: Payer: Medicare PPO | Admitting: Podiatry

## 2022-06-04 ENCOUNTER — Ambulatory Visit: Payer: Medicare PPO

## 2022-06-18 DEATH — deceased
